# Patient Record
Sex: Male | Born: 1950 | Race: White | Hispanic: No | Marital: Single | Smoking: Former smoker
Health system: Southern US, Community
[De-identification: ages and names within clinical notes are randomized; demographics above are authoritative.]

## PROBLEM LIST (undated history)

## (undated) DIAGNOSIS — R21 Rash and other nonspecific skin eruption: Secondary | ICD-10-CM

## (undated) DIAGNOSIS — K644 Residual hemorrhoidal skin tags: Secondary | ICD-10-CM

## (undated) DIAGNOSIS — M1990 Unspecified osteoarthritis, unspecified site: Secondary | ICD-10-CM

## (undated) DIAGNOSIS — H40003 Preglaucoma, unspecified, bilateral: Secondary | ICD-10-CM

## (undated) DIAGNOSIS — C4490 Unspecified malignant neoplasm of skin, unspecified: Secondary | ICD-10-CM

## (undated) DIAGNOSIS — J45909 Unspecified asthma, uncomplicated: Secondary | ICD-10-CM

## (undated) DIAGNOSIS — K219 Gastro-esophageal reflux disease without esophagitis: Secondary | ICD-10-CM

## (undated) DIAGNOSIS — L42 Pityriasis rosea: Secondary | ICD-10-CM

## (undated) DIAGNOSIS — M722 Plantar fascial fibromatosis: Secondary | ICD-10-CM

## (undated) DIAGNOSIS — Z85828 Personal history of other malignant neoplasm of skin: Secondary | ICD-10-CM

## (undated) DIAGNOSIS — A3790 Whooping cough, unspecified species without pneumonia: Secondary | ICD-10-CM

## (undated) DIAGNOSIS — D696 Thrombocytopenia, unspecified: Principal | ICD-10-CM

## (undated) DIAGNOSIS — I10 Essential (primary) hypertension: Secondary | ICD-10-CM

## (undated) DIAGNOSIS — G039 Meningitis, unspecified: Secondary | ICD-10-CM

## (undated) DIAGNOSIS — B009 Herpesviral infection, unspecified: Secondary | ICD-10-CM

## (undated) DIAGNOSIS — Z973 Presence of spectacles and contact lenses: Secondary | ICD-10-CM

## (undated) DIAGNOSIS — Z872 Personal history of diseases of the skin and subcutaneous tissue: Secondary | ICD-10-CM

## (undated) DIAGNOSIS — J189 Pneumonia, unspecified organism: Secondary | ICD-10-CM

## (undated) DIAGNOSIS — Z9889 Other specified postprocedural states: Secondary | ICD-10-CM

## (undated) DIAGNOSIS — D649 Anemia, unspecified: Secondary | ICD-10-CM

## (undated) DIAGNOSIS — B029 Zoster without complications: Secondary | ICD-10-CM

## (undated) DIAGNOSIS — K642 Third degree hemorrhoids: Secondary | ICD-10-CM

## (undated) DIAGNOSIS — J309 Allergic rhinitis, unspecified: Secondary | ICD-10-CM

## (undated) DIAGNOSIS — C801 Malignant (primary) neoplasm, unspecified: Secondary | ICD-10-CM

## (undated) DIAGNOSIS — Z86010 Personal history of colonic polyps: Secondary | ICD-10-CM

## (undated) DIAGNOSIS — D692 Other nonthrombocytopenic purpura: Secondary | ICD-10-CM

## (undated) DIAGNOSIS — N529 Male erectile dysfunction, unspecified: Secondary | ICD-10-CM

## (undated) HISTORY — DX: Gastro-esophageal reflux disease without esophagitis: K21.9

## (undated) HISTORY — DX: Unspecified asthma, uncomplicated: J45.909

## (undated) HISTORY — DX: Pityriasis rosea: L42

## (undated) HISTORY — DX: Plantar fascial fibromatosis: M72.2

## (undated) HISTORY — PX: HEMORRHOID BANDING: SHX5850

## (undated) HISTORY — DX: Meningitis, unspecified: G03.9

## (undated) HISTORY — PX: TONSILLECTOMY: SHX5217

## (undated) HISTORY — PX: COLONOSCOPY: SHX174

## (undated) HISTORY — DX: Essential (primary) hypertension: I10

## (undated) HISTORY — DX: Zoster without complications: B02.9

## (undated) HISTORY — PX: OTHER SURGICAL HISTORY: SHX169

## (undated) HISTORY — DX: Allergic rhinitis, unspecified: J30.9

## (undated) HISTORY — DX: Unspecified osteoarthritis, unspecified site: M19.90

## (undated) HISTORY — DX: Personal history of colonic polyps: Z86.010

## (undated) HISTORY — DX: Residual hemorrhoidal skin tags: K64.4

## (undated) HISTORY — DX: Male erectile dysfunction, unspecified: N52.9

## (undated) HISTORY — DX: Third degree hemorrhoids: K64.2

## (undated) HISTORY — PX: MOHS SURGERY: SUR867

## (undated) HISTORY — DX: Thrombocytopenia, unspecified: D69.6

## (undated) HISTORY — DX: Herpesviral infection, unspecified: B00.9

## (undated) HISTORY — DX: Rash and other nonspecific skin eruption: R21

## (undated) HISTORY — DX: Whooping cough, unspecified species without pneumonia: A37.90

---
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
## 1984-11-15 HISTORY — PX: DIRECT LARYNGOSCOPY: SHX5326

## 2002-11-15 DIAGNOSIS — L298 Other pruritus: Secondary | ICD-10-CM

## 2002-11-15 DIAGNOSIS — L2989 Other pruritus: Secondary | ICD-10-CM

## 2002-11-15 HISTORY — DX: Other pruritus: L29.89

## 2002-11-15 HISTORY — DX: Other pruritus: L29.8

## 2004-11-15 HISTORY — PX: KNEE ARTHROSCOPY W/ DEBRIDEMENT: SHX1867

## 2005-08-27 ENCOUNTER — Ambulatory Visit: Payer: Self-pay | Admitting: Internal Medicine

## 2005-09-24 ENCOUNTER — Ambulatory Visit: Payer: Self-pay | Admitting: Internal Medicine

## 2005-09-24 ENCOUNTER — Encounter: Admission: RE | Admit: 2005-09-24 | Discharge: 2005-09-24 | Payer: Self-pay | Admitting: Internal Medicine

## 2005-10-26 ENCOUNTER — Ambulatory Visit (HOSPITAL_BASED_OUTPATIENT_CLINIC_OR_DEPARTMENT_OTHER): Admission: RE | Admit: 2005-10-26 | Discharge: 2005-10-26 | Payer: Self-pay | Admitting: Orthopedic Surgery

## 2005-10-26 ENCOUNTER — Ambulatory Visit (HOSPITAL_COMMUNITY): Admission: RE | Admit: 2005-10-26 | Discharge: 2005-10-26 | Payer: Self-pay | Admitting: Orthopedic Surgery

## 2005-10-26 HISTORY — PX: KNEE ARTHROSCOPY W/ MENISCAL REPAIR: SHX1877

## 2005-11-17 ENCOUNTER — Ambulatory Visit: Payer: Self-pay | Admitting: Family Medicine

## 2005-11-19 ENCOUNTER — Ambulatory Visit: Payer: Self-pay | Admitting: Internal Medicine

## 2005-11-26 ENCOUNTER — Ambulatory Visit: Payer: Self-pay | Admitting: Internal Medicine

## 2006-02-21 ENCOUNTER — Ambulatory Visit: Payer: Self-pay | Admitting: Internal Medicine

## 2006-04-22 ENCOUNTER — Ambulatory Visit: Payer: Self-pay | Admitting: Internal Medicine

## 2006-06-14 ENCOUNTER — Ambulatory Visit: Payer: Self-pay | Admitting: Internal Medicine

## 2006-07-19 ENCOUNTER — Ambulatory Visit: Payer: Self-pay | Admitting: Internal Medicine

## 2006-09-16 ENCOUNTER — Ambulatory Visit: Payer: Self-pay | Admitting: Internal Medicine

## 2006-10-13 ENCOUNTER — Ambulatory Visit: Payer: Self-pay | Admitting: Internal Medicine

## 2006-12-12 ENCOUNTER — Ambulatory Visit: Payer: Self-pay | Admitting: Internal Medicine

## 2007-01-09 ENCOUNTER — Ambulatory Visit: Payer: Self-pay | Admitting: Internal Medicine

## 2007-01-20 ENCOUNTER — Ambulatory Visit: Payer: Self-pay | Admitting: Internal Medicine

## 2007-02-16 ENCOUNTER — Ambulatory Visit: Payer: Self-pay | Admitting: Internal Medicine

## 2007-02-16 ENCOUNTER — Ambulatory Visit: Payer: Self-pay | Admitting: Cardiology

## 2007-02-16 LAB — CONVERTED CEMR LAB
ALT: 68 units/L — ABNORMAL HIGH (ref 0–40)
AST: 62 units/L — ABNORMAL HIGH (ref 0–37)
Albumin: 4 g/dL (ref 3.5–5.2)
Alkaline Phosphatase: 125 units/L — ABNORMAL HIGH (ref 39–117)
BUN: 7 mg/dL (ref 6–23)
Bilirubin, Direct: 0.3 mg/dL (ref 0.0–0.3)
CO2: 29 meq/L (ref 19–32)
Calcium: 9.4 mg/dL (ref 8.4–10.5)
Chloride: 102 meq/L (ref 96–112)
Creatinine, Ser: 0.9 mg/dL (ref 0.4–1.5)
GFR calc Af Amer: 113 mL/min
GFR calc non Af Amer: 93 mL/min
Glucose, Bld: 85 mg/dL (ref 70–99)
Potassium: 3.9 meq/L (ref 3.5–5.1)
Sodium: 140 meq/L (ref 135–145)
Total Bilirubin: 1.4 mg/dL — ABNORMAL HIGH (ref 0.3–1.2)
Total Protein: 6.8 g/dL (ref 6.0–8.3)

## 2007-03-02 ENCOUNTER — Ambulatory Visit: Payer: Self-pay | Admitting: Cardiology

## 2007-03-21 ENCOUNTER — Ambulatory Visit: Payer: Self-pay | Admitting: Internal Medicine

## 2007-03-21 LAB — CONVERTED CEMR LAB
ALT: 34 units/L (ref 0–40)
AST: 34 units/L (ref 0–37)
Albumin: 3.9 g/dL (ref 3.5–5.2)
Alkaline Phosphatase: 48 units/L (ref 39–117)
BUN: 13 mg/dL (ref 6–23)
Basophils Absolute: 0 10*3/uL (ref 0.0–0.1)
Basophils Relative: 0.4 % (ref 0.0–1.0)
Bilirubin, Direct: 0.1 mg/dL (ref 0.0–0.3)
CO2: 28 meq/L (ref 19–32)
Calcium: 9.2 mg/dL (ref 8.4–10.5)
Chloride: 109 meq/L (ref 96–112)
Cholesterol: 197 mg/dL (ref 0–200)
Creatinine, Ser: 1.1 mg/dL (ref 0.4–1.5)
Eosinophils Absolute: 0.1 10*3/uL (ref 0.0–0.6)
Eosinophils Relative: 1.5 % (ref 0.0–5.0)
GFR calc Af Amer: 90 mL/min
GFR calc non Af Amer: 74 mL/min
Glucose, Bld: 94 mg/dL (ref 70–99)
HCT: 41.2 % (ref 39.0–52.0)
HDL: 59 mg/dL (ref >39.0)
Hemoglobin: 14.4 g/dL (ref 13.0–17.0)
LDL Cholesterol: 120 mg/dL — ABNORMAL HIGH (ref 0–99)
Lymphocytes Relative: 31.1 % (ref 12.0–46.0)
MCHC: 34.9 g/dL (ref 30.0–36.0)
MCV: 98.7 fL (ref 78.0–100.0)
Monocytes Absolute: 0.5 10*3/uL (ref 0.2–0.7)
Monocytes Relative: 9.4 % (ref 3.0–11.0)
Neutro Abs: 2.8 10*3/uL (ref 1.4–7.7)
Neutrophils Relative %: 57.6 % (ref 43.0–77.0)
PSA: 0.16 ng/mL (ref 0.10–4.00)
Platelets: 157 10*3/uL (ref 150–400)
Potassium: 4.2 meq/L (ref 3.5–5.1)
RBC: 4.18 M/uL — ABNORMAL LOW (ref 4.22–5.81)
RDW: 13.5 % (ref 11.5–14.6)
Sodium: 143 meq/L (ref 135–145)
TSH: 1.98 microintl units/mL (ref 0.35–5.50)
Total Bilirubin: 0.9 mg/dL (ref 0.3–1.2)
Total CHOL/HDL Ratio: 3.3
Total Protein: 6.2 g/dL (ref 6.0–8.3)
Triglycerides: 91 mg/dL (ref 0–149)
VLDL: 18 mg/dL (ref 0–40)
WBC: 5 10*3/uL (ref 4.5–10.5)

## 2007-03-28 ENCOUNTER — Ambulatory Visit: Payer: Self-pay | Admitting: Internal Medicine

## 2007-05-11 DIAGNOSIS — J45909 Unspecified asthma, uncomplicated: Secondary | ICD-10-CM | POA: Insufficient documentation

## 2007-05-11 HISTORY — DX: Unspecified asthma, uncomplicated: J45.909

## 2007-06-28 ENCOUNTER — Ambulatory Visit: Payer: Self-pay | Admitting: Internal Medicine

## 2007-06-28 DIAGNOSIS — L42 Pityriasis rosea: Secondary | ICD-10-CM | POA: Insufficient documentation

## 2007-06-28 DIAGNOSIS — K219 Gastro-esophageal reflux disease without esophagitis: Secondary | ICD-10-CM | POA: Insufficient documentation

## 2007-06-28 DIAGNOSIS — M1710 Unilateral primary osteoarthritis, unspecified knee: Secondary | ICD-10-CM | POA: Insufficient documentation

## 2007-06-28 DIAGNOSIS — M179 Osteoarthritis of knee, unspecified: Secondary | ICD-10-CM | POA: Insufficient documentation

## 2007-06-28 HISTORY — DX: Gastro-esophageal reflux disease without esophagitis: K21.9

## 2007-06-28 HISTORY — DX: Pityriasis rosea: L42

## 2007-06-28 LAB — CONVERTED CEMR LAB

## 2007-07-03 ENCOUNTER — Telehealth: Payer: Self-pay | Admitting: *Deleted

## 2007-10-02 ENCOUNTER — Ambulatory Visit: Payer: Self-pay | Admitting: Internal Medicine

## 2007-12-07 ENCOUNTER — Ambulatory Visit: Payer: Self-pay | Admitting: Internal Medicine

## 2008-01-03 ENCOUNTER — Telehealth: Payer: Self-pay | Admitting: Internal Medicine

## 2008-01-22 ENCOUNTER — Ambulatory Visit: Payer: Self-pay | Admitting: Internal Medicine

## 2008-01-23 ENCOUNTER — Telehealth: Payer: Self-pay | Admitting: Internal Medicine

## 2008-03-25 ENCOUNTER — Ambulatory Visit: Payer: Self-pay | Admitting: Internal Medicine

## 2008-04-02 ENCOUNTER — Encounter: Payer: Self-pay | Admitting: Internal Medicine

## 2008-04-02 ENCOUNTER — Ambulatory Visit: Payer: Self-pay | Admitting: Internal Medicine

## 2008-04-05 ENCOUNTER — Encounter: Payer: Self-pay | Admitting: Internal Medicine

## 2008-05-06 ENCOUNTER — Ambulatory Visit: Payer: Self-pay | Admitting: Internal Medicine

## 2008-05-22 ENCOUNTER — Telehealth: Payer: Self-pay | Admitting: Internal Medicine

## 2008-05-23 ENCOUNTER — Ambulatory Visit: Payer: Self-pay | Admitting: Internal Medicine

## 2008-05-23 LAB — CONVERTED CEMR LAB
ALT: 29 units/L (ref 0–53)
AST: 26 units/L (ref 0–37)
Albumin: 4.1 g/dL (ref 3.5–5.2)
Alkaline Phosphatase: 57 units/L (ref 39–117)
BUN: 13 mg/dL (ref 6–23)
Basophils Absolute: 0 10*3/uL (ref 0.0–0.1)
Basophils Relative: 0.2 % (ref 0.0–1.0)
Bilirubin Urine: NEGATIVE
Bilirubin, Direct: 0.1 mg/dL (ref 0.0–0.3)
CO2: 29 meq/L (ref 19–32)
Calcium: 9.2 mg/dL (ref 8.4–10.5)
Chloride: 106 meq/L (ref 96–112)
Cholesterol: 179 mg/dL (ref 0–200)
Creatinine, Ser: 0.9 mg/dL (ref 0.4–1.5)
Eosinophils Absolute: 0.1 10*3/uL (ref 0.0–0.7)
Eosinophils Relative: 1 % (ref 0.0–5.0)
GFR calc Af Amer: 113 mL/min
GFR calc non Af Amer: 93 mL/min
Glucose, Bld: 85 mg/dL (ref 70–99)
HCT: 41.8 % (ref 39.0–52.0)
HDL: 58.3 mg/dL (ref >39.0)
Hemoglobin: 14.8 g/dL (ref 13.0–17.0)
Ketones, urine, test strip: NEGATIVE
LDL Cholesterol: 108 mg/dL — ABNORMAL HIGH (ref 0–99)
Lymphocytes Relative: 30.8 % (ref 12.0–46.0)
MCHC: 35.5 g/dL (ref 30.0–36.0)
MCV: 96.3 fL (ref 78.0–100.0)
Monocytes Absolute: 0.5 10*3/uL (ref 0.1–1.0)
Monocytes Relative: 7.9 % (ref 3.0–12.0)
Neutro Abs: 3.3 10*3/uL (ref 1.4–7.7)
Neutrophils Relative %: 60.1 % (ref 43.0–77.0)
Nitrite: NEGATIVE
PSA: 0.17 ng/mL (ref 0.10–4.00)
Platelets: 146 10*3/uL — ABNORMAL LOW (ref 150–400)
Potassium: 4 meq/L (ref 3.5–5.1)
Protein, U semiquant: NEGATIVE
RBC: 4.34 M/uL (ref 4.22–5.81)
RDW: 11.3 % — ABNORMAL LOW (ref 11.5–14.6)
Sodium: 143 meq/L (ref 135–145)
Specific Gravity, Urine: 1.01
TSH: 1.3 microintl units/mL (ref 0.35–5.50)
Total Bilirubin: 1.1 mg/dL (ref 0.3–1.2)
Total CHOL/HDL Ratio: 3.1
Total Protein: 6.4 g/dL (ref 6.0–8.3)
Triglycerides: 62 mg/dL (ref 0–149)
Urobilinogen, UA: 0.2
VLDL: 12 mg/dL (ref 0–40)
WBC Urine, dipstick: NEGATIVE
WBC: 5.7 10*3/uL (ref 4.5–10.5)
pH: 7.5

## 2008-05-29 ENCOUNTER — Ambulatory Visit: Payer: Self-pay | Admitting: Internal Medicine

## 2008-05-29 DIAGNOSIS — R791 Abnormal coagulation profile: Secondary | ICD-10-CM | POA: Insufficient documentation

## 2008-05-29 LAB — CONVERTED CEMR LAB
INR: 1.1 — ABNORMAL HIGH (ref 0.8–1.0)
Prothrombin Time: 12.8 s (ref 10.9–13.3)
aPTT: 29.2 s (ref 21.7–29.8)

## 2008-06-04 LAB — CONVERTED CEMR LAB: Protein S Ag, Total: 82 % (ref 70–140)

## 2008-06-14 ENCOUNTER — Ambulatory Visit: Payer: Self-pay | Admitting: Oncology

## 2008-06-25 ENCOUNTER — Encounter: Payer: Self-pay | Admitting: Internal Medicine

## 2008-07-02 ENCOUNTER — Ambulatory Visit: Payer: Self-pay | Admitting: Internal Medicine

## 2008-07-02 DIAGNOSIS — R21 Rash and other nonspecific skin eruption: Secondary | ICD-10-CM | POA: Insufficient documentation

## 2008-07-10 ENCOUNTER — Encounter: Payer: Self-pay | Admitting: Internal Medicine

## 2008-07-15 ENCOUNTER — Ambulatory Visit: Payer: Self-pay | Admitting: Internal Medicine

## 2008-08-16 ENCOUNTER — Ambulatory Visit: Payer: Self-pay | Admitting: Internal Medicine

## 2008-08-16 DIAGNOSIS — J309 Allergic rhinitis, unspecified: Secondary | ICD-10-CM | POA: Insufficient documentation

## 2008-08-16 DIAGNOSIS — J310 Chronic rhinitis: Secondary | ICD-10-CM | POA: Insufficient documentation

## 2008-08-29 ENCOUNTER — Encounter: Payer: Self-pay | Admitting: Internal Medicine

## 2008-09-18 ENCOUNTER — Ambulatory Visit: Payer: Self-pay | Admitting: Internal Medicine

## 2008-09-18 DIAGNOSIS — K644 Residual hemorrhoidal skin tags: Secondary | ICD-10-CM | POA: Insufficient documentation

## 2008-09-18 DIAGNOSIS — K602 Anal fissure, unspecified: Secondary | ICD-10-CM | POA: Insufficient documentation

## 2008-09-18 HISTORY — DX: Residual hemorrhoidal skin tags: K64.4

## 2008-09-19 ENCOUNTER — Encounter: Payer: Self-pay | Admitting: Internal Medicine

## 2008-09-20 ENCOUNTER — Telehealth: Payer: Self-pay | Admitting: Internal Medicine

## 2008-09-25 ENCOUNTER — Telehealth (INDEPENDENT_AMBULATORY_CARE_PROVIDER_SITE_OTHER): Payer: Self-pay | Admitting: *Deleted

## 2008-09-27 ENCOUNTER — Ambulatory Visit: Payer: Self-pay | Admitting: Internal Medicine

## 2008-09-27 ENCOUNTER — Telehealth: Payer: Self-pay | Admitting: Internal Medicine

## 2008-09-27 DIAGNOSIS — L02419 Cutaneous abscess of limb, unspecified: Secondary | ICD-10-CM | POA: Insufficient documentation

## 2008-09-27 DIAGNOSIS — L03119 Cellulitis of unspecified part of limb: Secondary | ICD-10-CM

## 2008-10-04 ENCOUNTER — Telehealth (INDEPENDENT_AMBULATORY_CARE_PROVIDER_SITE_OTHER): Payer: Self-pay | Admitting: *Deleted

## 2008-10-15 ENCOUNTER — Ambulatory Visit: Payer: Self-pay | Admitting: Internal Medicine

## 2008-10-15 DIAGNOSIS — D485 Neoplasm of uncertain behavior of skin: Secondary | ICD-10-CM | POA: Insufficient documentation

## 2008-10-18 ENCOUNTER — Encounter: Payer: Self-pay | Admitting: Internal Medicine

## 2008-10-24 ENCOUNTER — Ambulatory Visit: Payer: Self-pay | Admitting: Internal Medicine

## 2008-11-21 ENCOUNTER — Telehealth: Payer: Self-pay | Admitting: Internal Medicine

## 2008-11-22 ENCOUNTER — Ambulatory Visit: Payer: Self-pay | Admitting: Internal Medicine

## 2008-11-22 DIAGNOSIS — A5400 Gonococcal infection of lower genitourinary tract, unspecified: Secondary | ICD-10-CM | POA: Insufficient documentation

## 2009-01-15 ENCOUNTER — Ambulatory Visit: Payer: Self-pay | Admitting: Internal Medicine

## 2009-04-03 ENCOUNTER — Telehealth: Payer: Self-pay | Admitting: Internal Medicine

## 2009-04-30 ENCOUNTER — Ambulatory Visit: Payer: Self-pay | Admitting: Internal Medicine

## 2009-05-23 ENCOUNTER — Ambulatory Visit: Payer: Self-pay | Admitting: Internal Medicine

## 2009-05-23 LAB — CONVERTED CEMR LAB
ALT: 35 units/L (ref 0–53)
AST: 39 units/L — ABNORMAL HIGH (ref 0–37)
Albumin: 4.2 g/dL (ref 3.5–5.2)
Alkaline Phosphatase: 56 units/L (ref 39–117)
BUN: 14 mg/dL (ref 6–23)
Basophils Absolute: 0 10*3/uL (ref 0.0–0.1)
Basophils Relative: 0.2 % (ref 0.0–3.0)
Bilirubin Urine: NEGATIVE
Bilirubin, Direct: 0.1 mg/dL (ref 0.0–0.3)
Blood in Urine, dipstick: NEGATIVE
CO2: 32 meq/L (ref 19–32)
Calcium: 9.2 mg/dL (ref 8.4–10.5)
Chloride: 104 meq/L (ref 96–112)
Cholesterol: 180 mg/dL (ref 0–200)
Creatinine, Ser: 0.9 mg/dL (ref 0.4–1.5)
Eosinophils Absolute: 0.1 10*3/uL (ref 0.0–0.7)
Eosinophils Relative: 0.9 % (ref 0.0–5.0)
GFR calc non Af Amer: 92.68 mL/min (ref >60)
Glucose, Bld: 98 mg/dL (ref 70–99)
Glucose, Urine, Semiquant: NEGATIVE
HCT: 42.4 % (ref 39.0–52.0)
HDL: 55.7 mg/dL (ref >39.00)
Hemoglobin: 15 g/dL (ref 13.0–17.0)
Ketones, urine, test strip: NEGATIVE
LDL Cholesterol: 111 mg/dL — ABNORMAL HIGH (ref 0–99)
Lymphocytes Relative: 27.8 % (ref 12.0–46.0)
Lymphs Abs: 1.6 10*3/uL (ref 0.7–4.0)
MCHC: 35.3 g/dL (ref 30.0–36.0)
MCV: 98 fL (ref 78.0–100.0)
Monocytes Absolute: 0.5 10*3/uL (ref 0.1–1.0)
Monocytes Relative: 7.8 % (ref 3.0–12.0)
Neutro Abs: 3.6 10*3/uL (ref 1.4–7.7)
Neutrophils Relative %: 63.3 % (ref 43.0–77.0)
Nitrite: NEGATIVE
PSA: 0.19 ng/mL (ref 0.10–4.00)
Platelets: 152 10*3/uL (ref 150.0–400.0)
Potassium: 4.6 meq/L (ref 3.5–5.1)
Protein, U semiquant: NEGATIVE
RBC: 4.32 M/uL (ref 4.22–5.81)
RDW: 12.2 % (ref 11.5–14.6)
Sodium: 143 meq/L (ref 135–145)
Specific Gravity, Urine: 1.015
TSH: 1.39 microintl units/mL (ref 0.35–5.50)
Total Bilirubin: 1.1 mg/dL (ref 0.3–1.2)
Total CHOL/HDL Ratio: 3
Total Protein: 6.5 g/dL (ref 6.0–8.3)
Triglycerides: 65 mg/dL (ref 0.0–149.0)
Urobilinogen, UA: 0.2
VLDL: 13 mg/dL (ref 0.0–40.0)
WBC Urine, dipstick: NEGATIVE
WBC: 5.8 10*3/uL (ref 4.5–10.5)
pH: 8

## 2009-06-02 ENCOUNTER — Ambulatory Visit: Payer: Self-pay | Admitting: Internal Medicine

## 2009-06-06 LAB — CONVERTED CEMR LAB

## 2009-07-28 ENCOUNTER — Ambulatory Visit: Payer: Self-pay | Admitting: Internal Medicine

## 2009-09-22 ENCOUNTER — Ambulatory Visit: Payer: Self-pay | Admitting: Internal Medicine

## 2009-09-22 ENCOUNTER — Telehealth: Payer: Self-pay | Admitting: Internal Medicine

## 2009-09-22 DIAGNOSIS — N410 Acute prostatitis: Secondary | ICD-10-CM | POA: Insufficient documentation

## 2009-10-06 ENCOUNTER — Ambulatory Visit: Payer: Self-pay | Admitting: Internal Medicine

## 2009-11-24 ENCOUNTER — Telehealth: Payer: Self-pay | Admitting: Internal Medicine

## 2009-11-24 ENCOUNTER — Ambulatory Visit: Payer: Self-pay | Admitting: Internal Medicine

## 2009-12-22 ENCOUNTER — Ambulatory Visit: Payer: Self-pay | Admitting: Internal Medicine

## 2009-12-22 DIAGNOSIS — J441 Chronic obstructive pulmonary disease with (acute) exacerbation: Secondary | ICD-10-CM | POA: Insufficient documentation

## 2009-12-24 ENCOUNTER — Telehealth: Payer: Self-pay | Admitting: Internal Medicine

## 2010-01-19 ENCOUNTER — Ambulatory Visit: Payer: Self-pay | Admitting: Internal Medicine

## 2010-01-21 ENCOUNTER — Ambulatory Visit: Payer: Self-pay | Admitting: Internal Medicine

## 2010-01-22 ENCOUNTER — Encounter: Payer: Self-pay | Admitting: Internal Medicine

## 2010-02-17 ENCOUNTER — Ambulatory Visit: Payer: Self-pay | Admitting: Internal Medicine

## 2010-03-13 ENCOUNTER — Telehealth: Payer: Self-pay | Admitting: Internal Medicine

## 2010-03-13 ENCOUNTER — Encounter: Payer: Self-pay | Admitting: Internal Medicine

## 2010-03-16 ENCOUNTER — Telehealth: Payer: Self-pay | Admitting: Internal Medicine

## 2010-03-17 ENCOUNTER — Ambulatory Visit: Payer: Self-pay | Admitting: Internal Medicine

## 2010-03-19 ENCOUNTER — Telehealth: Payer: Self-pay | Admitting: Internal Medicine

## 2010-05-14 ENCOUNTER — Ambulatory Visit: Payer: Self-pay | Admitting: Family Medicine

## 2010-05-14 DIAGNOSIS — M722 Plantar fascial fibromatosis: Secondary | ICD-10-CM

## 2010-05-14 HISTORY — DX: Plantar fascial fibromatosis: M72.2

## 2010-06-19 ENCOUNTER — Ambulatory Visit: Payer: Self-pay | Admitting: Internal Medicine

## 2010-06-19 LAB — CONVERTED CEMR LAB
ALT: 26 units/L (ref 0–53)
AST: 26 units/L (ref 0–37)
Albumin: 4.4 g/dL (ref 3.5–5.2)
Alkaline Phosphatase: 57 units/L (ref 39–117)
BUN: 13 mg/dL (ref 6–23)
Basophils Absolute: 0 10*3/uL (ref 0.0–0.1)
Basophils Relative: 0.6 % (ref 0.0–3.0)
Bilirubin Urine: NEGATIVE
Bilirubin, Direct: 0.2 mg/dL (ref 0.0–0.3)
Blood in Urine, dipstick: NEGATIVE
CO2: 31 meq/L (ref 19–32)
Calcium: 9.4 mg/dL (ref 8.4–10.5)
Chloride: 105 meq/L (ref 96–112)
Cholesterol: 204 mg/dL — ABNORMAL HIGH (ref 0–200)
Creatinine, Ser: 0.9 mg/dL (ref 0.4–1.5)
Direct LDL: 117.8 mg/dL
Eosinophils Absolute: 0.1 10*3/uL (ref 0.0–0.7)
Eosinophils Relative: 1.1 % (ref 0.0–5.0)
GFR calc non Af Amer: 92.33 mL/min (ref >60)
Glucose, Bld: 94 mg/dL (ref 70–99)
Glucose, Urine, Semiquant: NEGATIVE
HCT: 43.9 % (ref 39.0–52.0)
HDL: 63 mg/dL (ref >39.00)
Hemoglobin: 15.2 g/dL (ref 13.0–17.0)
Ketones, urine, test strip: NEGATIVE
Lymphocytes Relative: 32.5 % (ref 12.0–46.0)
Lymphs Abs: 1.7 10*3/uL (ref 0.7–4.0)
MCHC: 34.7 g/dL (ref 30.0–36.0)
MCV: 100.8 fL — ABNORMAL HIGH (ref 78.0–100.0)
Monocytes Absolute: 0.4 10*3/uL (ref 0.1–1.0)
Monocytes Relative: 7.1 % (ref 3.0–12.0)
Neutro Abs: 3.1 10*3/uL (ref 1.4–7.7)
Neutrophils Relative %: 58.7 % (ref 43.0–77.0)
Nitrite: NEGATIVE
PSA: 0.2 ng/mL (ref 0.10–4.00)
Platelets: 151 10*3/uL (ref 150.0–400.0)
Potassium: 4.4 meq/L (ref 3.5–5.1)
Protein, U semiquant: NEGATIVE
RBC: 4.35 M/uL (ref 4.22–5.81)
RDW: 13 % (ref 11.5–14.6)
Sodium: 144 meq/L (ref 135–145)
Specific Gravity, Urine: 1.015
TSH: 1.39 microintl units/mL (ref 0.35–5.50)
Total Bilirubin: 0.6 mg/dL (ref 0.3–1.2)
Total CHOL/HDL Ratio: 3
Total Protein: 6.5 g/dL (ref 6.0–8.3)
Triglycerides: 63 mg/dL (ref 0.0–149.0)
Urobilinogen, UA: 0.2
VLDL: 12.6 mg/dL (ref 0.0–40.0)
WBC Urine, dipstick: NEGATIVE
WBC: 5.3 10*3/uL (ref 4.5–10.5)
pH: 7

## 2010-06-26 ENCOUNTER — Ambulatory Visit: Payer: Self-pay | Admitting: Internal Medicine

## 2010-07-10 LAB — CONVERTED CEMR LAB

## 2010-09-29 ENCOUNTER — Encounter: Payer: Self-pay | Admitting: Internal Medicine

## 2010-10-23 ENCOUNTER — Ambulatory Visit: Payer: Self-pay | Admitting: Internal Medicine

## 2010-12-02 ENCOUNTER — Telehealth: Payer: Self-pay | Admitting: Internal Medicine

## 2010-12-03 ENCOUNTER — Encounter: Payer: Self-pay | Admitting: *Deleted

## 2010-12-03 ENCOUNTER — Encounter: Payer: Self-pay | Admitting: Internal Medicine

## 2010-12-03 ENCOUNTER — Ambulatory Visit
Admission: RE | Admit: 2010-12-03 | Discharge: 2010-12-03 | Payer: Self-pay | Source: Home / Self Care | Attending: Internal Medicine | Admitting: Internal Medicine

## 2010-12-03 DIAGNOSIS — J45909 Unspecified asthma, uncomplicated: Secondary | ICD-10-CM

## 2010-12-03 DIAGNOSIS — Z862 Personal history of diseases of the blood and blood-forming organs and certain disorders involving the immune mechanism: Secondary | ICD-10-CM

## 2010-12-03 DIAGNOSIS — D696 Thrombocytopenia, unspecified: Secondary | ICD-10-CM

## 2010-12-03 HISTORY — DX: Thrombocytopenia, unspecified: D69.6

## 2010-12-03 HISTORY — DX: Personal history of diseases of the blood and blood-forming organs and certain disorders involving the immune mechanism: Z86.2

## 2010-12-03 NOTE — Patient Instructions (Signed)
Metered Dose Inhaler (No spacer used)   Inhaled medicines are the basis of asthma treatment and other breathing problems. Inhaled medicine can only be effective if used properly. Good technique assures that the medicine reaches the lungs.   Metered dose inhalers (MDIs) are used to deliver a variety of inhaled medicines. These include quick relief medicines, controller medicines (such as corticosteroids), and cromolyn. The medicine is delivered by pushing down on a metal canister to release a set amount of spray.    If you are using different kinds of inhalers, use your quick relief medicine to open the airways 10 - 15 minutes before using a steroid. If you are unsure which inhalers to use and the order of using them, ask your caregiver, nurse, or respiratory therapist.   HOW TO USE THE INHALER 1 .Remove cap from inhaler. 2 .Shake inhaler for 5 seconds before each inhalation (breathing in). 3 .Position the inhaler so that the top of the canister faces up. 4 .Put your index finger on the top of the medication canister. Your thumb supports the bottom of the inhaler. 5 .Open your mouth. 6 .Hold the inhaler 1 to 2 inches away from your open mouth. This allows the medicine to slow down before the medicine enters the mouth. 7 .Exhale (breathe out) normally and as completely as possible. 8 .Press the canister down with the index finger to release the medication. 9 .At the same time as the canister is pressed, inhale deeply and slowly until the lungs are completely filled. This should take 4 to 6 seconds. Keep your tongue down. 1 0.Hold the medication in your lungs for 4 to 10 seconds before exhaling (breathing out). This helps the medicine get into the small airways of your lungs to work better. 1 1.Breathe out slowly, through pursed lips. Whistling is an example of pursed lips. 1 2.Wait at least 1 minute between puffs. Continue with the above steps until you have taken the number of puffs your  caregiver has ordered. 1 3.Replace cap on inhaler.   AVOID the following:  Inhaling before or after starting the spray of medicine. It takes practice to coordinate your breathing with triggering the spray.  Inhaling through the nose (rather than the mouth) when triggering the spray.   HOW TO DETERMINE IF YOUR INHALER IS FULL OR NEARLY EMPTY:  Determine when an inhaler is empty. It is not easy to know when an MDI canister is empty by shaking it. A few MDIs are now being made with dose counters. Ask your caregiver for a prescription that has a dose counter if you feel you need that extra help.  If your inhaler does not have a counter, check the number of doses in the inhaler before you use it. The canister or box will list the number of doses in the canister. Divide the total number of doses in the canister by the number you will use each day to find how many days the canister will last. (For example, if your canister has 200 doses and you take 2 puffs, 4 times each day, which is 8 puffs a day. Dividing 200 by 8 equals 25.  The canister should last 25 days.) Using a calendar, count forward that many days to see when your inhaler will run out. Write the refill date on a calendar or your canister.  Remember, if you need to take extra doses, the inhaler will empty sooner than you figured. Be sure you have a refill before your canister runs  out. Refill your inhaler 7 to 10 days before it runs out.   HOME CARE INSTRUCTIONS  DO NOT use the inhaler more than your caregiver tells you. If you are still wheezing and are feeling tightness in your chest, call your caregiver.   Keep an adequate supply of medication. This includes making sure the medicine is not expired, and you have a spare MDI.  Follow your caregiver or inhaler insert directions for cleaning the inhaler.   SEEK MEDICAL CARE IF:  Symptoms are only partially relieved with your inhalers.  You are having trouble using your inhalers.   You experience some increase in phlegm.  You develop a fever of 100.5 F (38.1 C).   SEEK IMMEDIATE MEDICAL CARE IF:  You feel little or no relief with your inhalers. You are still wheezing and are feeling shortness of breath and/or tightness in your chest.  You have side effects such as dizziness, headaches, or fast heart rate.  You have chills, fever, night sweats or an oral temperature above 102 F (38.9 C) develops.  Phlegm production increases a lot, or there is blood in the phlegm.   MAKE SURE YOU:   Understand these instructions.   Will watch your condition.  Will get help right away if you are not doing well or get worse.   Document Released: 08/29/2007  Document Re-Released: 08/28/2009 [facility] Patient Information 2011 [place], [place].

## 2010-12-03 NOTE — Assessment & Plan Note (Signed)
Pulmonary functions will be obtained today as a baseline and the dulera will be reduced to 100/20 due to the frequency of the fungal infections. Her has improved so much in the frequency of his asthma flairs that I am hesitant to removed the corticosteroid completely. We ill follow up in 2 months with repeat PFT and monitering A refillable rx fordiflucan will be given as well.

## 2010-12-03 NOTE — Progress Notes (Signed)
Subjective:     Patient ID: Tyler [name] is a 60 y.o. male.  Asthma This is a recurrent problem. The current episode started more than 1 year ago. The problem occurs intermittently. The problem has been gradually improving. Associated symptoms include postnasal drip, rhinorrhea, a sore throat and trouble swallowing. Pertinent negatives include no appetite change, chest pain, dyspnea on exertion or ear congestion. Associated symptoms comments: Recurrent candidal infections in the deep throat from the use of steroid inhailers. Relieved by: diflucan fro the candidal infection. He reports significant improvement on treatment. Risk factors for lung disease include smoking/tobacco exposure. His past medical history is significant for asthma.    The following portions of the patient's history were reviewed and updated as appropriate: allergies, current medications, past family history, past medical history, past social history, past surgical history and problem list.  Review of Systems  Constitutional: Negative for appetite change.  HENT: Positive for sore throat, rhinorrhea, trouble swallowing and postnasal drip.   Eyes: Negative.   Respiratory: Negative.   Cardiovascular: Negative.  Negative for chest pain and dyspnea on exertion.  Neurological: Negative.   Psychiatric/Behavioral: Negative.      Objective:   Physical Exam  Constitutional: He is oriented to person, place, and time.  HENT:  Head: Normocephalic.  Right Ear: External ear normal.  Left Ear: External ear normal.  Mouth/Throat: Oropharynx is clear and moist.       Surgical changes from tonsilectomy  Eyes: Conjunctivae are normal. Pupils are equal, round, and reactive to light.  Neck: Normal range of motion. Neck supple.  Pulmonary/Chest: Breath sounds normal.  Abdominal: Bowel sounds are normal.  Musculoskeletal: Normal range of motion.  Neurological: He is alert and oriented to person, place, and time.  Skin: Skin is warm  and dry.     Assessment:  See the problem list addendum with PFTs and change in medications

## 2010-12-05 ENCOUNTER — Encounter: Payer: Self-pay | Admitting: Internal Medicine

## 2010-12-15 NOTE — Progress Notes (Signed)
Summary: temp 101-102  Phone Note Call from Patient   Caller: Patient Call For: Stacie Glaze MD Summary of Call: Pt is no better and has developed a fever of 101-102.  Asking for advice.  Same symptoms. [phone] Initial call taken by: Lynann Beaver CMA,  Mar 19, 2010 9:55 AM  Follow-up for Phone Call        per dr Ival Bible levaquin 750 1 once daily for 10 days Follow-up by: Willy Eddy, LPN,  Mar 20, 5175 10:15 AM    New/Updated Medications: LEVAQUIN 750 MG TABS (LEVOFLOXACIN) one by mouth q day Prescriptions: LEVAQUIN 750 MG TABS (LEVOFLOXACIN) one by mouth q day  #10 x 0   Entered by:   Lynann Beaver CMA   Authorized by:   Stacie Glaze MD   Signed by:   Lynann Beaver CMA on 03/19/2010   Method used:   Electronically to        [facility] [address] [id number]* (retail)       [address]       [place], [place]  [address]       Ph: [phone]       Fax: [phone]   RxID:   [id number]  Pt notified.

## 2010-12-15 NOTE — Assessment & Plan Note (Signed)
Summary: sinusitis/bmw   Vital Signs:  Patient profile:   60 year old male Height:      71 inches Weight:      163 pounds BMI:     22.82 Temp:     99.1 degrees F oral Pulse rate:   76 / minute Resp:     14 per minute BP sitting:   130 / 80  (left arm)  Vitals Entered By: Willy Eddy, LPN (Mar 17, 1609 8:51 AM) CC: continues to c/o cough and congestion and earache-has completed zpack,1 day remaining of prednisone dose pack, and some huydromet remaining-  has performance this weekend   CC:  continues to c/o cough and congestion and earache-has completed zpack, 1 day remaining of prednisone dose pack, and and some huydromet remaining-  has performance this weekend.  History of Present Illness: severe bronchitis with increased cough fits of cough that result in wheezing recent z pack and cough meds with no effect using nebulized three times a day no wheezing except fter cough fits hx of asthma  Preventive Screening-Counseling & Management  Alcohol-Tobacco     Smoking Status: quit  Problems Prior to Update: 1)  Bronchitis, Obstructive Chronic, Acute Exacerbation  (ICD-491.21) 2)  Pharyngitis, Streptococcal  (ICD-034.0) 3)  Acute Prostatitis  (ICD-601.0) 4)  Rash-nonvesicular  (ICD-782.1) 5)  Rash-nonvesicular  (ICD-782.1) 6)  Gonococcal Infection Lower Genitourinary Tract  (ICD-098.0) 7)  Neoplasm, Skin, Uncertain Behavior  (ICD-238.2) 8)  Cellulitis and Abscess of Leg Except Foot  (ICD-682.6) 9)  Rectal Fissure  (ICD-565.0) 10)  External Hemorrhoids With Other Complication  (ICD-455.5) 11)  Allergic Rhinitis  (ICD-477.9) 12)  Rash and Other Nonspecific Skin Eruption  (ICD-782.1) 13)  Abnormal Coagulation Profile  (ICD-790.92) 14)  Contusion of Unspecified Site  (ICD-924.9) 15)  Physical Examination  (ICD-V70.0) 16)  Screening For Unspecified Malignant Neoplasm  (ICD-V76.9) 17)  Osteoarthrosis, Local Nos, 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 Take 1 Tablet By Mouth Once A Day 2)  Veramyst 27.5 Mcg/spray  Susp (Fluticasone Furoate) .Marland Kitchen.. 1 Spray Each Nostril Two Times A Day 3)  Cialis 5 Mg Tabs (Tadalafil) .... One By Mouth Daily 4)  Nexium 40 Mg Cpdr (Esomeprazole Magnesium) .... One By Mouth Two Times A Day 5)  Singulair 10 Mg Tabs (Montelukast Sodium) .Marland Kitchen.. 1 Once Daily 6)  Dulera 200-5 Mcg/act Aero (Mometasone Furo-Formoterol Fum) .... Two Puff Two Times A Day 7)  Proair Hfa 108 (90 Base) Mcg/act Aers (Albuterol Sulfate) .... 2 Inhalations Two Times A Day Prn 8)  Albuterol Sulfate (2.5 Mg/24ml) 0.083% Nebu (Albuterol Sulfate) .... To Use A 8 Hours As Needed With Nebulizer Prn 9)  Medrol (Pak) 4 Mg Tabs (Methylprednisolone) .... Take As Directed 10)  Hydromet 5-1.5 Mg/24ml Syrp (Hydrocodone-Homatropine) .Marland Kitchen.. 1 Tsp Every 6 Hours Prh Cough 11)  Zithromax Z-Pak 250 Mg Tabs (Azithromycin) .... Take As Directed  Current Medications (verified): 1)  Valtrex 1 Gm  Tabs (Valacyclovir Hcl) .... Take 1 Tablet By Mouth Once A Day 2)  Veramyst 27.5 Mcg/spray  Susp (Fluticasone Furoate) .Marland Kitchen.. 1 Spray Each Nostril Two Times A Day 3)  Cialis 5 Mg Tabs (Tadalafil) .... One By Mouth Daily 4)  Nexium 40 Mg Cpdr (Esomeprazole Magnesium) .... One By Mouth Two Times A  Day 5)  Singulair 10 Mg Tabs (Montelukast Sodium) .Marland Kitchen.. 1 Once Daily 6)  Dulera 200-5 Mcg/act Aero (Mometasone Furo-Formoterol Fum) .... Two Puff Two Times A Day 7)  Proair Hfa 108 (90 Base) Mcg/act Aers (Albuterol Sulfate) .... 2 Inhalations Two Times A Day Prn 8)  Albuterol Sulfate (2.5 Mg/58ml) 0.083% Nebu (Albuterol Sulfate) .... To Use A 8 Hours As Needed With Nebulizer Prn 9)  Tussionex Pennkinetic Er 8-10 Mg/54ml Lqcr (Chlorpheniramine-Hydrocodone) .... One Tsp By Mouth Two Times A Day 10)   Prednisone 20 Mg Tabs (Prednisone) .... 3 By Mouth For 3 Days Then 2 By Mouth For 2 Days and Then 1 By Mouth For 2 Day and Then 1/2 By Mouth For 2 Days 11)  Mucinex D 60-600 Mg Xr12h-Tab (Pseudoephedrine-Guaifenesin) .... One Totwo By Mouth Two Times A Day 12)  Fluconazole 100 Mg Tabs (Fluconazole) .... One By Mouth Daily For 7 Days Post The Prednisone  Allergies (verified): 1)  ! Pcn 2)  ! 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  Past Medical History: Reviewed history from 08/16/2008 and no changes required. Asthma Herpes GERD Allergic rhinitis  Past Surgical History: Reviewed history from 07/12/07 and no changes required. arthroscopic surgery right knee bx  for granuloma  Family History: Reviewed history from 07/12/07 and no changes required. father died at 64 MVA mother  Family History of Arthritis Family History Hypertension  Social History: Reviewed history from 07/12/07 and no changes required. 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  The patient denies anorexia, fever, weight loss, weight gain, vision loss, decreased hearing, hoarseness, chest pain, syncope, peripheral edema, hemoptysis, abdominal pain, melena, hematochezia, severe indigestion/heartburn, hematuria, incontinence, genital sores,  muscle weakness, suspicious skin lesions, transient blindness, difficulty walking, depression, unusual weight change, abnormal bleeding, enlarged lymph nodes, angioedema, breast masses, and testicular masses.    Physical Exam  General:  alert and pale.   Head:  normocephalic and no abnormalities observed.   Eyes:  pupils equal and pupils round.   Ears:  R ear normal and L ear normal.   Nose:  no external deformity and no nasal discharge.   Neck:  nuchal rigidity.  muscle spasm Lungs:  normal respiratory effort and no wheezes.   Heart:  normal rate and regular rhythm.   Abdomen:  soft, non-tender, and normal bowel sounds.   Msk:  normal ROM, no joint tenderness, and no joint swelling.   Extremities:  trace left pedal edema and trace right pedal edema.   Neurologic:  alert & oriented X3 and cranial nerves II-XII intact.     Impression & Recommendations:  Problem # 1:  BRONCHITIS, OBSTRUCTIVE CHRONIC, ACUTE EXACERBATION (ICD-491.21) Assessment Deteriorated acute visit for flair of asthma and worsening cough with swheezing and increased nebulizer use prednisone dose pack tussionex to control cough and mucinex d two times a day moniter  Problem # 2:  ASTHMA (ICD-493.90) Assessment: Deteriorated  The following medications were removed  from the medication list:    Medrol (pak) 4 Mg Tabs (Methylprednisolone) .Marland Kitchen... Take as directed His updated medication list for this problem includes:    Singulair 10 Mg Tabs (Montelukast sodium) .Marland Kitchen... 1 once daily    Dulera 200-5 Mcg/act Aero (Mometasone furo-formoterol fum) .Marland Kitchen..Marland Kitchen Two puff two times a day    Proair Hfa 108 (90 Base) Mcg/act Aers (Albuterol sulfate) .Marland Kitchen... 2 inhalations two times a day prn    Albuterol Sulfate (2.5 Mg/77ml) 0.083% Nebu (Albuterol sulfate) .Marland Kitchen... To use a 8 hours as needed with nebulizer prn    Prednisone 20 Mg Tabs (Prednisone) .Marland KitchenMarland KitchenMarland KitchenMarland Kitchen 3 by mouth for 3 days then 2 by mouth for 2 days and then 1 by mouth for 2 day and then 1/2  by mouth for 2 days  Complete Medication List: 1)  Valtrex 1 Gm Tabs (Valacyclovir hcl) .... Take 1 tablet by mouth once a day 2)  Veramyst 27.5 Mcg/spray Susp (Fluticasone furoate) .Marland Kitchen.. 1 spray each nostril two times a day 3)  Cialis 5 Mg Tabs (Tadalafil) .... One by mouth daily 4)  Nexium 40 Mg Cpdr (Esomeprazole magnesium) .... One by mouth two times a day 5)  Singulair 10 Mg Tabs (Montelukast sodium) .Marland Kitchen.. 1 once daily 6)  Dulera 200-5 Mcg/act Aero (Mometasone furo-formoterol fum) .... Two puff two times a day 7)  Proair Hfa 108 (90 Base) Mcg/act Aers (Albuterol sulfate) .... 2 inhalations two times a day prn 8)  Albuterol Sulfate (2.5 Mg/87ml) 0.083% Nebu (Albuterol sulfate) .... To use a 8 hours as needed with nebulizer prn 9)  Tussionex Pennkinetic Er 8-10 Mg/32ml Lqcr (Chlorpheniramine-hydrocodone) .... One tsp by mouth two times a day 10)  Prednisone 20 Mg Tabs (Prednisone) .... 3 by mouth for 3 days then 2 by mouth for 2 days and then 1 by mouth for 2 day and then 1/2 by mouth for 2 days 11)  Mucinex D 60-600 Mg Xr12h-tab (Pseudoephedrine-guaifenesin) .... One totwo by mouth two times a day 12)  Fluconazole 100 Mg Tabs (Fluconazole) .... One by mouth daily for 7 days post the prednisone  Patient Instructions: 1)  call if no improvment in  5-7 days, sooner if increasing cough, fever, or new symptoms( shortness of breath, chest pain). Prescriptions: FLUCONAZOLE 100 MG TABS (FLUCONAZOLE) one by mouth daily for 7 days post the prednisone  #7 x 0   Entered and Authorized by:   Stacie Glaze MD   Signed by:   Stacie Glaze MD on 03/17/2010   Method used:   Electronically to        [facility] [address] [phone]* (retail)       [address]       [place], [place]  [address]       Ph: [phone]       Fax: [phone]   RxID:   [id number] Sandria Senter ER 8-10 MG/5ML LQCR (CHLORPHENIRAMINE-HYDROCODONE) one tsp by mouth two times a day  #4  oz x 0   Entered and Authorized by:   Stacie Glaze MD   Signed by:   Stacie Glaze MD on 03/17/2010   Method used:   Print then Give to Patient   RxID:   [id number] PREDNISONE 20 MG TABS (PREDNISONE) 3 by mouth for 3 days then 2 by mouth for 2 days and then 1 by mouth for 2 day and then 1/2 by mouth for 2 days  #20  x 0   Entered and Authorized by:   Stacie Glaze MD   Signed by:   Stacie Glaze MD on 03/17/2010   Method used:   Electronically to        [facility] [address] #[id number]* (retail)       [address]       [place], [place]  [address]       Ph: [phone]       Fax: [phone]   RxID:   [id number]   Appended Document: Orders Update    Clinical Lists Changes  Orders: Added new Service order of Depo- Medrol 80mg  (J1040) - Signed Added new Service order of Admin of Therapeutic Inj  intramuscular or subcutaneous (47425) - Signed       Medication Administration  Injection # 1:    Medication: Depo- Medrol 80mg     Diagnosis: BRONCHITIS, OBSTRUCTIVE CHRONIC, ACUTE EXACERBATION (ICD-491.21)    Route: IM    Site: LUOQ gluteus    Exp Date: 09/15/2012    Lot #: [id number]    Mfr: [employer]    Comments: 120mg /1.67ml    Patient tolerated injection without complications    Given by: Willy Eddy, LPN (Mar 17, 9562 12:45 PM)  Orders Added: 1)  Depo- Medrol 80mg  [J1040] 2)  Admin of Therapeutic Inj  intramuscular or subcutaneous [[id number]]

## 2010-12-15 NOTE — Assessment & Plan Note (Signed)
Summary: 1 MONTH FUP//CCM   Vital Signs:  Patient profile:   60 year old male Height:      71 inches Weight:      163 pounds BMI:     22.82 Temp:     98.1 degrees F oral Pulse rate:   72 / minute Resp:     12 per minute BP sitting:   132 / 80  (left arm)  Vitals Entered By: Willy Eddy, LPN (February 17, 1609 11:02 AM) CC: roa- pft's last month   CC:  roa- pft's last month.  History of Present Illness: the PFT's did not sugges loss of lung capcity or change is plan for asthma control rishing vigorisly after the inhailer  Asthma History    Asthma Control Assessment:    Age range: 12+ years    Symptoms: 0-2 days/week    Nighttime Awakenings: 0-2/month    Interferes w/ normal activity: no limitations    Asthma Control Assessment: Well Controlled    Preventive Screening-Counseling & Management  Alcohol-Tobacco     Smoking Status: quit  Problems Prior to Update: 1)  Bronchitis, Obstructive Chronic, Acute Exacerbation  (ICD-491.21) 2)  Pharyngitis, Streptococcal  (ICD-034.0) 3)  Acute Prostatitis  (ICD-601.0) 4)  Rash-nonvesicular  (ICD-782.1) 5)  Rash-nonvesicular  (ICD-782.1) 6)  Gonococcal Infection Lower Genitourinary Tract  (ICD-098.0) 7)  Neoplasm, Skin, Uncertain Behavior  (ICD-238.2) 8)  Cellulitis and Abscess of Leg Except Foot  (ICD-682.6) 9)  Rectal Fissure  (ICD-565.0) 10)  External Hemorrhoids With Other Complication  (ICD-455.5) 11)  Allergic Rhinitis  (ICD-477.9) 12)  Rash and Other Nonspecific Skin Eruption  (ICD-782.1) 13)  Abnormal Coagulation Profile  (ICD-790.92) 14)  Contusion of Unspecified Site  (ICD-924.9) 15)  Physical Examination  (ICD-V70.0) 16)  Screening For Unspecified Malignant Neoplasm  (ICD-V76.9) 17)  Osteoarthrosis, Local Nos, Lower Leg  (ICD-715.36) 18)  Sexually Transmitted Disease, Exposure To  (ICD-V01.6) 19)  Pityriasis Rosea  (ICD-696.3) 20)  Gerd  (ICD-530.81) 21)  Asthma  (ICD-493.90)  Current Problems  (verified): 1)  Bronchitis, Obstructive Chronic, Acute Exacerbation  (ICD-491.21) 2)  Pharyngitis, Streptococcal  (ICD-034.0) 3)  Acute Prostatitis  (ICD-601.0) 4)  Rash-nonvesicular  (ICD-782.1) 5)  Rash-nonvesicular  (ICD-782.1) 6)  Gonococcal Infection Lower Genitourinary Tract  (ICD-098.0) 7)  Neoplasm, Skin, Uncertain Behavior  (ICD-238.2) 8)  Cellulitis and Abscess of Leg Except Foot  (ICD-682.6) 9)  Rectal Fissure  (ICD-565.0) 10)  External Hemorrhoids With Other Complication  (ICD-455.5) 11)  Allergic Rhinitis  (ICD-477.9) 12)  Rash and Other Nonspecific Skin Eruption  (ICD-782.1) 13)  Abnormal Coagulation Profile  (ICD-790.92) 14)  Contusion of Unspecified Site  (ICD-924.9) 15)  Physical Examination  (ICD-V70.0) 16)  Screening For Unspecified Malignant Neoplasm  (ICD-V76.9) 17)  Osteoarthrosis, Local Nos, Lower Leg  (ICD-715.36) 18)  Sexually Transmitted Disease, Exposure To  (ICD-V01.6) 19)  Pityriasis Rosea  (ICD-696.3) 20)  Gerd  (ICD-530.81) 21)  Asthma  (ICD-493.90)  Medications Prior to Update: 1)  Valtrex 1 Gm  Tabs (Valacyclovir Hcl) .... Take 1 Tablet By Mouth Once A Day 2)  Veramyst 27.5 Mcg/spray  Susp (Fluticasone Furoate) .Marland Kitchen.. 1 Spray Each Nostril Two Times A Day 3)  Cialis 5 Mg Tabs (Tadalafil) .... One By Mouth Daily 4)  Nexium 40 Mg Cpdr (Esomeprazole Magnesium) .... One By Mouth Two Times A Day 5)  Singulair 10 Mg Tabs (Montelukast Sodium) .Marland Kitchen.. 1 Once Daily 6)  Dulera 200-5 Mcg/act Aero (Mometasone Furo-Formoterol Fum) .... Two Puff Two Times  A Day 7)  Proair Hfa 108 (90 Base) Mcg/act Aers (Albuterol Sulfate) .... 2 Inhalations Two Times A Day Prn 8)  Mucinex 600 Mg Xr12h-Tab (Guaifenesin) .... One By Mouth Two Times A Day  Current Medications (verified): 1)  Valtrex 1 Gm  Tabs (Valacyclovir Hcl) .... Take 1 Tablet By Mouth Once A Day 2)  Veramyst 27.5 Mcg/spray  Susp (Fluticasone Furoate) .Marland Kitchen.. 1 Spray Each Nostril Two Times A Day 3)  Cialis 5 Mg Tabs  (Tadalafil) .... One By Mouth Daily 4)  Nexium 40 Mg Cpdr (Esomeprazole Magnesium) .... One By Mouth Two Times A Day 5)  Singulair 10 Mg Tabs (Montelukast Sodium) .Marland Kitchen.. 1 Once Daily 6)  Dulera 200-5 Mcg/act Aero (Mometasone Furo-Formoterol Fum) .... Two Puff Two Times A Day 7)  Proair Hfa 108 (90 Base) Mcg/act Aers (Albuterol Sulfate) .... 2 Inhalations Two Times A Day Prn 8)  Albuterol Sulfate (2.5 Mg/5ml) 0.083% Nebu (Albuterol Sulfate) .... To Use A 8 Hours As Needed With Nebulizer Prn  Allergies (verified): 1)  ! Pcn 2)  ! 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  Past Medical History: Reviewed history from 08/16/2008 and no changes required. Asthma Herpes GERD Allergic rhinitis  Past Surgical History: Reviewed history from 07/10/07 and no changes required. arthroscopic surgery right knee bx  for granuloma  Family History: Reviewed history from 07-10-2007 and no changes required. father died at 33 MVA mother  Family History of Arthritis Family History Hypertension  Social History: Reviewed history from 07/10/07 and no changes required. Occupation:singer/prof Media planner Former Smoker Alcohol use-yes Drug use-no Regular exercise-yes  Review of Systems  The patient denies anorexia, fever, weight loss, weight gain, vision loss, decreased hearing, hoarseness, chest pain, syncope, dyspnea on exertion, peripheral edema, prolonged cough, headaches, hemoptysis,  abdominal pain, melena, hematochezia, severe indigestion/heartburn, hematuria, incontinence, genital sores, muscle weakness, suspicious skin lesions, transient blindness, difficulty walking, depression, unusual weight change, abnormal bleeding, enlarged lymph nodes, angioedema, and breast masses.    Physical Exam  General:  Well-developed,well-nourished,in no acute distress; alert,appropriate and cooperative throughout examination Head:  normocephalic and atraumatic.   Eyes:  pupils equal and pupils round.   Ears:  R ear normal and L ear normal.   Nose:  External nasal examination shows no deformity or inflammation. Nasal mucosa are pink and moist without lesions or exudates. Neck:  nuchal rigidity.  muscle spasm Lungs:  normal respiratory effort and no wheezes.   Heart:  normal rate and regular rhythm.   Abdomen:  Bowel sounds positive,abdomen soft and non-tender without masses, organomegaly or hernias noted. Msk:  No deformity or scoliosis noted of thoracic or lumbar spine.   Extremities:  No clubbing, cyanosis, edema, or deformity noted with normal full range of motion of all joints.   Neurologic:  alert & oriented X3 and finger-to-nose normal.     Impression & Recommendations:  Problem # 1:  ASTHMA (ICD-493.90)  His updated medication list for this problem includes:    Singulair 10 Mg Tabs (Montelukast sodium) .Marland Kitchen... 1 once daily    Dulera 200-5 Mcg/act Aero (Mometasone  furo-formoterol fum) .Marland Kitchen..Marland Kitchen Two puff two times a day    Proair Hfa 108 (90 Base) Mcg/act Aers (Albuterol sulfate) .Marland Kitchen... 2 inhalations two times a day prn    Albuterol Sulfate (2.5 Mg/29ml) 0.083% Nebu (Albuterol sulfate) .Marland Kitchen... To use a 8 hours as needed with nebulizer prn  Current Asthma Management Plan:  Green Zone:    SINGULAIR 10 MG TABS DULERA 200-5 MCG/ACT AERO Yellow Zone: Peak Flow:      350 to: 450  PROAIR HFA 108 (90 BASE) MCG/ACT AERS:  nebulizer treatment once a day Red: Peak Flow:       <200  PROAIR HFA 108 (90 BASE) MCG/ACT AERS Start Prednisone Taper.    Complete Medication List: 1)  Valtrex 1 Gm Tabs (Valacyclovir hcl) .... Take 1 tablet by mouth once a day 2)  Veramyst 27.5 Mcg/spray Susp (Fluticasone furoate) .Marland Kitchen.. 1 spray each nostril two times a day 3)  Cialis 5 Mg Tabs (Tadalafil) .... One by mouth daily 4)  Nexium 40 Mg Cpdr (Esomeprazole magnesium) .... One by mouth two times a day 5)  Singulair 10 Mg Tabs (Montelukast sodium) .Marland Kitchen.. 1 once daily 6)  Dulera 200-5 Mcg/act Aero (Mometasone furo-formoterol fum) .... Two puff two times a day 7)  Proair Hfa 108 (90 Base) Mcg/act Aers (Albuterol sulfate) .... 2 inhalations two times a day prn 8)  Albuterol Sulfate (2.5 Mg/85ml) 0.083% Nebu (Albuterol sulfate) .... To use a 8 hours as needed with nebulizer prn  Asthma Management Plan    Asthma Severity: Moderate Persistent    Control Assessment: Well Controlled    Plan based on PEF formula: Nunn and Dinah Beers Zone:  SINGULAIR 10 MG TABS DULERA 200-5 MCG/ACT AERO  Yellow Zone: PROAIR HFA 108 (90 BASE) MCG/ACT AERS:  nebulizer treatment once a day  Red Zone: PROAIR HFA 108 (90 BASE) MCG/ACT AERS Start Prednisone Taper.    Patient Instructions: 1)  Please schedule a follow-up appointment in 3 months. Prescriptions: ALBUTEROL SULFATE (2.5 MG/3ML) 0.083% NEBU (ALBUTEROL SULFATE) to use a 8 hours as needed with nebulizer prn  #50 unit/vial x 0   Entered and Authorized by:   Stacie Glaze MD   Signed by:   Stacie Glaze MD on 02/17/2010   Method used:   Electronically to        [facility] #[id number]* (retail)       [address]       [place], [place]  [address]       Ph: [phone]       Fax: [phone]   RxID:   [id number] DULERA 200-5 MCG/ACT AERO (MOMETASONE FURO-FORMOTEROL FUM) two puff two times a day  #1 unit x 11   Entered and Authorized by:   Stacie Glaze MD   Signed by:   Stacie Glaze MD on  02/17/2010   Method used:   Electronically to        [facility] #[id number]* (retail)       [address]       [place], [place]  [address]       Ph: [phone]       Fax: [phone]   RxID:   [id number]

## 2010-12-15 NOTE — Progress Notes (Signed)
Summary: Would like to be seen Today  Phone Note Call from Patient Call back at Home Phone [phone]   Caller: Patient Summary of Call: Pt is not feeling any better would like to be worked in., medication is not helping with sinus drainage and stuffiness. Initial call taken by: Trixie Dredge,  Mar 16, 2010 8:06 AM  Follow-up for Phone Call        appointment given for 8:45 am Follow-up by: Willy Eddy, LPN,  Mar 16, 9146 10:11 AM

## 2010-12-15 NOTE — Miscellaneous (Signed)
Summary: Orders Update  Clinical Lists Changes  Medications: Removed medication of AMOXICILLIN 500 MG CAPS (AMOXICILLIN) 1 three times a day for 10 days Added new medication of CEFACLOR 250 MG CAPS (CEFACLOR) 1 two times a day - Signed Rx of CEFACLOR 250 MG CAPS (CEFACLOR) 1 two times a day;  #20 x 0;  Signed;  Entered by: Willy Eddy, LPN;  Authorized by: Stacie Glaze MD;  Method used: Electronically to [facility] #[id number]*, [address], [place], [place], [place]  [address], Ph: [phone], Fax: [phone]    Prescriptions: CEFACLOR 250 MG CAPS (CEFACLOR) 1 two times a day  #20 x 0   Entered by:   Willy Eddy, LPN   Authorized by:   Stacie Glaze MD   Signed by:   Willy Eddy, LPN on 57/84/6962   Method used:   Electronically to        [facility] [phone]* (retail)       [address]       [place], [place]  [address]       Ph: [phone]       Fax: [phone]   RxID:   [id number]   Appended Document: Orders Update pt allergic to amoxicillin  Appended Document: Orders Update    Clinical Lists Changes  Medications: Changed medication from CEFACLOR 250 MG CAPS (CEFACLOR) 1 two times a day to ZITHROMAX Z-PAK 250 MG TABS (AZITHROMYCIN) take as directed - Signed Rx of ZITHROMAX Z-PAK 250 MG TABS (AZITHROMYCIN) take as directed;  #1 x 0;  Signed;  Entered by: Willy Eddy, LPN;  Authorized by: Stacie Glaze MD;  Method used: Electronically to [facility] #[id number]*, [address] [place], [place], [place]  [address], Ph: [phone], Fax: [phone]    Prescriptions: ZITHROMAX Z-PAK 250 MG TABS (AZITHROMYCIN) take as directed  #1 x 0   Entered by:   Willy Eddy, LPN   Authorized by:   Stacie Glaze MD   Signed by:   Willy Eddy, LPN on 62/37/6283   Method used:   Electronically to        [facility] #[id number]*  (retail)       [address]       [place], [place]  [address]       Ph: [phone]       Fax: [phone]   RxID:   [id number]

## 2010-12-15 NOTE — Letter (Signed)
Summary: [facility]  [facility]   Imported By: Maryln Gottron 10/15/2010 10:48:17  _____________________________________________________________________  External Attachment:    Type:   Image     Comment:   External Document

## 2010-12-15 NOTE — Assessment & Plan Note (Signed)
Summary: 3 month fup//ccm   Vital Signs:  Patient profile:   60 year old male Height:      71 inches Weight:      163 pounds BMI:     22.82 Temp:     98.2 degrees F oral Pulse rate:   76 / minute Resp:     14 per minute BP sitting:   136 / 80  (left arm)  Vitals Entered By: Willy Eddy, LPN (January 19, 8118 8:09 AM) CC: roa- check spot on leg   CC:  roa- check spot on leg.  History of Present Illness: Asthma has been "rough" with multiple flairs was seen at [facility] for "fungal infection" around vocal cords has increased nebulizer use to daily still running 3 miles a day?  Asthma History    Initial Asthma Severity Rating:    Age range: 12+ years    Symptoms: >2 days/week; not daily    Nighttime Awakenings: >1/week but not nightly    Interferes w/ normal activity: minor limitations    Asthma Severity Assessment: Moderate Persistent    Preventive Screening-Counseling & Management  Alcohol-Tobacco     Smoking Status: quit  Problems Prior to Update: 1)  Bronchitis, Obstructive Chronic, Acute Exacerbation  (ICD-491.21) 2)  Pharyngitis, Streptococcal  (ICD-034.0) 3)  Acute Prostatitis  (ICD-601.0) 4)  Rash-nonvesicular  (ICD-782.1) 5)  Rash-nonvesicular  (ICD-782.1) 6)  Gonococcal Infection Lower Genitourinary Tract  (ICD-098.0) 7)  Neoplasm, Skin, Uncertain Behavior  (ICD-238.2) 8)  Cellulitis and Abscess of Leg Except Foot  (ICD-682.6) 9)  Rectal Fissure  (ICD-565.0) 10)  External Hemorrhoids With Other Complication  (ICD-455.5) 11)  Allergic Rhinitis  (ICD-477.9) 12)  Rash and Other Nonspecific Skin Eruption  (ICD-782.1) 13)  Abnormal Coagulation Profile  (ICD-790.92) 14)  Contusion of Unspecified Site  (ICD-924.9) 15)  Physical Examination  (ICD-V70.0) 16)  Screening For Unspecified Malignant Neoplasm  (ICD-V76.9) 17)  Osteoarthrosis, Local Nos, Lower Leg  (ICD-715.36) 18)  Sexually Transmitted Disease, Exposure To  (ICD-V01.6) 19)  Pityriasis Rosea   (ICD-696.3) 20)  Gerd  (ICD-530.81) 21)  Asthma  (ICD-493.90)  Current Problems (verified): 1)  Bronchitis, Obstructive Chronic, Acute Exacerbation  (ICD-491.21) 2)  Pharyngitis, Streptococcal  (ICD-034.0) 3)  Acute Prostatitis  (ICD-601.0) 4)  Rash-nonvesicular  (ICD-782.1) 5)  Rash-nonvesicular  (ICD-782.1) 6)  Gonococcal Infection Lower Genitourinary Tract  (ICD-098.0) 7)  Neoplasm, Skin, Uncertain Behavior  (ICD-238.2) 8)  Cellulitis and Abscess of Leg Except Foot  (ICD-682.6) 9)  Rectal Fissure  (ICD-565.0) 10)  External Hemorrhoids With Other Complication  (ICD-455.5) 11)  Allergic Rhinitis  (ICD-477.9) 12)  Rash and Other Nonspecific Skin Eruption  (ICD-782.1) 13)  Abnormal Coagulation Profile  (ICD-790.92) 14)  Contusion of Unspecified Site  (ICD-924.9) 15)  Physical Examination  (ICD-V70.0) 16)  Screening For Unspecified Malignant Neoplasm  (ICD-V76.9) 17)  Osteoarthrosis, Local Nos, Lower Leg  (ICD-715.36) 18)  Sexually Transmitted Disease, Exposure To  (ICD-V01.6) 19)  Pityriasis Rosea  (ICD-696.3) 20)  Gerd  (ICD-530.81) 21)  Asthma  (ICD-493.90)  Medications Prior to Update: 1)  Valtrex 1 Gm  Tabs (Valacyclovir Hcl) .... Take 1 Tablet By Mouth Once A Day 2)  Veramyst 27.5 Mcg/spray  Susp (Fluticasone Furoate) .Marland Kitchen.. 1 Spray Each Nostril Two Times A Day 3)  Cialis 5 Mg Tabs (Tadalafil) .... One By Mouth Daily 4)  Nexium 40 Mg Cpdr (Esomeprazole Magnesium) .... One By Mouth Two Times A Day 5)  Singulair 10 Mg Tabs (Montelukast Sodium) .Marland KitchenMarland KitchenMarland Kitchen 1  Once Daily 6)  Symbicort 160-4.5 Mcg/act Aero (Budesonide-Formoterol Fumarate) .... 2 Puffs Two Times A Day 7)  Proair Hfa 108 (90 Base) Mcg/act Aers (Albuterol Sulfate) .... 2 Inhalations Two Times A Day Prn 8)  Zyrtec Hives Relief 10 Mg Tabs (Cetirizine Hcl) .Marland Kitchen.. 1 Once Daily 9)  Azithromycin 250 Mg Tabs (Azithromycin) .... Two By Mouth Now Then One By Mouth Daily For 4 Days 10)  Allerx-D 120-2.5 Mg Xr12h-Tab  (Pseudoephedrine-Methscopolamin) .... One By Mouth Two Times A Day  Current Medications (verified): 1)  Valtrex 1 Gm  Tabs (Valacyclovir Hcl) .... Take 1 Tablet By Mouth Once A Day 2)  Veramyst 27.5 Mcg/spray  Susp (Fluticasone Furoate) .Marland Kitchen.. 1 Spray Each Nostril Two Times A Day 3)  Cialis 5 Mg Tabs (Tadalafil) .... One By Mouth Daily 4)  Nexium 40 Mg Cpdr (Esomeprazole Magnesium) .... One By Mouth Two Times A Day 5)  Singulair 10 Mg Tabs (Montelukast Sodium) .Marland Kitchen.. 1 Once Daily 6)  Advair Diskus 250-50 Mcg/dose Aepb (Fluticasone-Salmeterol) .... One Puf By Mouth Two Times A Day 7)  Proair Hfa 108 (90 Base) Mcg/act Aers (Albuterol Sulfate) .... 2 Inhalations Two Times A Day Prn 8)  Mucinex 600 Mg Xr12h-Tab (Guaifenesin) .... One By Mouth Two Times A Day  Allergies (verified): 1)  ! Pcn 2)  ! 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  Past Medical History: Reviewed history from 08/16/2008 and no changes required. Asthma Herpes GERD Allergic rhinitis  Past Surgical History: Reviewed history from Jul 17, 2007 and no changes required. arthroscopic surgery right knee bx  for granuloma  Family History: Reviewed history from 2007/07/17 and no changes required. father died at 8 MVA mother  Family History of Arthritis Family History Hypertension  Social History: Reviewed history from 2007-07-17 and no changes required. Occupation:singer/prof Media planner Former Smoker Alcohol  use-yes Drug use-no Regular exercise-yes  Review of Systems  The patient denies anorexia, fever, weight loss, weight gain, vision loss, decreased hearing, hoarseness, chest pain, syncope, dyspnea on exertion, peripheral edema, prolonged cough, headaches, hemoptysis, abdominal pain, melena, hematochezia, severe indigestion/heartburn, hematuria, incontinence, genital sores, muscle weakness, suspicious skin lesions, transient blindness, difficulty walking, depression, unusual weight change, abnormal bleeding, enlarged lymph nodes, angioedema, and breast masses.    Physical Exam  General:  Well-developed,well-nourished,in no acute distress; alert,appropriate and cooperative throughout examination Head:  normocephalic and atraumatic.   Eyes:  pupils equal and pupils round.   Ears:  R ear normal and L ear normal.   Nose:  External nasal examination shows no deformity or inflammation. Nasal mucosa are pink and moist without lesions or exudates. Mouth:  pharyngeal erythema, pharyngeal exudate, and posterior lymphoid hypertrophy.   Neck:  nuchal rigidity.  muscle spasm Lungs:  normal respiratory effort and no wheezes.   Heart:  normal rate and regular rhythm.   Abdomen:  Bowel sounds positive,abdomen soft and non-tender without masses, organomegaly or hernias noted. Msk:  No deformity or scoliosis noted of thoracic or lumbar spine.   Extremities:  No clubbing, cyanosis, edema, or deformity noted with normal full range of motion of all  joints.     Impression & Recommendations:  Problem # 1:  ASTHMA (ICD-493.90) Assessment Deteriorated  His updated medication list for this problem includes:    Singulair 10 Mg Tabs (Montelukast sodium) .Marland Kitchen... 1 once daily    Dulera 200-5 Mcg/act Aero (Mometasone furo-formoterol fum) .Marland Kitchen..Marland Kitchen Two puff two times a day    Proair Hfa 108 (90 Base) Mcg/act Aers (Albuterol sulfate) .Marland Kitchen... 2 inhalations two times a day prn  Current Asthma Management Plan:  Green  Zone:   SYMBICORT 160-4.5 MCG/ACT AERO:  1 inhalation twice a day SINGULAIR 10 MG TABS Yellow Zone: Peak Flow:      350 to: 450  PROAIR HFA 108 (90 BASE) MCG/ACT AERS:  nebulizer treatment once a day Red: Peak Flow:      <200  PROAIR HFA 108 (90 BASE) MCG/ACT AERS Start Prednisone Taper.   next step is pumonary functions for assement  Orders: Pulmonary Referral (Pulmonary)  Problem # 2:  ALLERGIC RHINITIS (ICD-477.9)  The following medications were removed from the medication list:    Zyrtec Hives Relief 10 Mg Tabs (Cetirizine hcl) .Marland Kitchen... 1 once daily His updated medication list for this problem includes:    Veramyst 27.5 Mcg/spray Susp (Fluticasone furoate) .Marland Kitchen... 1 spray each nostril two times a day  Discussed use of allergy medications and environmental measures.   Complete Medication List: 1)  Valtrex 1 Gm Tabs (Valacyclovir hcl) .... Take 1 tablet by mouth once a day 2)  Veramyst 27.5 Mcg/spray Susp (Fluticasone furoate) .Marland Kitchen.. 1 spray each nostril two times a day 3)  Cialis 5 Mg Tabs (Tadalafil) .... One by mouth daily 4)  Nexium 40 Mg Cpdr (Esomeprazole magnesium) .... One by mouth two times a day 5)  Singulair 10 Mg Tabs (Montelukast sodium) .Marland Kitchen.. 1 once daily 6)  Dulera 200-5 Mcg/act Aero (Mometasone furo-formoterol fum) .... Two puff two times a day 7)  Proair Hfa 108 (90 Base) Mcg/act Aers (Albuterol sulfate) .... 2 inhalations two times a day prn 8)  Mucinex 600 Mg Xr12h-tab (Guaifenesin) .... One by mouth two times a day  Asthma Management Plan    Asthma Severity: Moderate Persistent    Plan based on PEF formula: Nunn and Dinah Beers Zone: SYMBICORT 160-4.5 MCG/ACT AERO:  1 inhalation twice a day SINGULAIR 10 MG TABS  Yellow Zone: PROAIR HFA 108 (90 BASE) MCG/ACT AERS:  nebulizer treatment once a day  Red Zone: PROAIR HFA 108 (90 BASE) MCG/ACT AERS Start Prednisone Taper.    Patient Instructions: 1)  Please schedule a follow-up appointment in 1 month.

## 2010-12-15 NOTE — Assessment & Plan Note (Signed)
Summary: uri/bmw   Vital Signs:  Patient profile:   60 year old male Height:      71 inches Weight:      159 pounds BMI:     22.26 Temp:     98.6 degrees F oral Pulse rate:   76 / minute Resp:     14 per minute BP sitting:   130 / 80  (left arm)  Vitals Entered By: Willy Eddy, LPN (November 24, 2009 2:06 PM) CC: c/o sore throat and earache, URI symptoms   CC:  c/o sore throat and earache and URI symptoms.  History of Present Illness:  URI Symptoms      This is a 60 year old man who presents with URI symptoms.  swollen uvulae and ear pain for 6 dyas.  The patient reports nasal congestion and productive cough, but denies clear nasal discharge, purulent nasal discharge, sore throat, earache, and sick contacts.  The patient denies fever, low-grade fever (<100.5 degrees), fever of 100.5-103 degrees, fever of 103.1-104 degrees, fever to >104 degrees, stiff neck, dyspnea, wheezing, rash, vomiting, diarrhea, use of an antipyretic, and response to antipyretic.  The patient also reports headache and severe fatigue.  The patient denies the following risk factors for Strep sinusitis: unilateral facial pain, unilateral nasal discharge, poor response to decongestant, double sickening, tooth pain, Strep exposure, tender adenopathy, and absence of cough.    Problems Prior to Update: 1)  Acute Prostatitis  (ICD-601.0) 2)  Rash-nonvesicular  (ICD-782.1) 3)  Rash-nonvesicular  (ICD-782.1) 4)  Gonococcal Infection Lower Genitourinary Tract  (ICD-098.0) 5)  Neoplasm, Skin, Uncertain Behavior  (ICD-238.2) 6)  Cellulitis and Abscess of Leg Except Foot  (ICD-682.6) 7)  Rectal Fissure  (ICD-565.0) 8)  External Hemorrhoids With Other Complication  (ICD-455.5) 9)  Allergic Rhinitis  (ICD-477.9) 10)  Rash and Other Nonspecific Skin Eruption  (ICD-782.1) 11)  Abnormal Coagulation Profile  (ICD-790.92) 12)  Contusion of Unspecified Site  (ICD-924.9) 13)  Physical Examination  (ICD-V70.0) 14)   Screening For Unspecified Malignant Neoplasm  (ICD-V76.9) 15)  Osteoarthrosis, Local Nos, 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 Take 1 Tablet By Mouth Once A Day 2)  Veramyst 27.5 Mcg/spray  Susp (Fluticasone Furoate) .Marland Kitchen.. 1 Spray Each Nostril Two Times A Day 3)  Cialis 5 Mg Tabs (Tadalafil) .... One By Mouth Daily 4)  Nexium 40 Mg Cpdr (Esomeprazole Magnesium) .... One By Mouth Once Daily 5)  Singulair 10 Mg Tabs (Montelukast Sodium) .Marland Kitchen.. 1 Once Daily 6)  Symbicort 160-4.5 Mcg/act Aero (Budesonide-Formoterol Fumarate) .... 2 Puffs Two Times A Day 7)  Proair Hfa 108 (90 Base) Mcg/act Aers (Albuterol Sulfate) .... 2 Inhalations Two Times A Day Prn 8)  Zyrtec Hives Relief 10 Mg Tabs (Cetirizine Hcl) .Marland Kitchen.. 1 Once Daily 9)  Ciprofloxacin Hcl 500 Mg Tabs (Ciprofloxacin Hcl) .... One By Mouth Two Times A Day Fpor 14 Days  Current Medications (verified): 1)  Valtrex 1 Gm  Tabs (Valacyclovir Hcl) .... Take 1 Tablet By Mouth Once A Day 2)  Veramyst 27.5 Mcg/spray  Susp (Fluticasone Furoate) .Marland Kitchen.. 1 Spray Each Nostril Two Times A Day 3)  Cialis 5 Mg Tabs (Tadalafil) .... One By Mouth Daily 4)  Nexium 40 Mg Cpdr (Esomeprazole Magnesium) .... One By Mouth Two Times A  Day 5)  Singulair 10 Mg Tabs (Montelukast Sodium) .Marland Kitchen.. 1 Once Daily 6)  Symbicort 160-4.5 Mcg/act Aero (Budesonide-Formoterol Fumarate) .... 2 Puffs Two Times A Day 7)  Proair Hfa 108 (90 Base) Mcg/act Aers (Albuterol Sulfate) .... 2 Inhalations Two Times A Day Prn 8)  Zyrtec Hives Relief 10 Mg Tabs (Cetirizine Hcl) .Marland Kitchen.. 1 Once Daily 9)  Cephalexin 500 Mg Caps (Cephalexin) .... One By Mouth Three Times A Day For 10 Days  Allergies (verified): 1)  ! Pcn 2)  ! 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  Past Medical History: Reviewed history from 08/16/2008 and no changes required. Asthma Herpes GERD Allergic rhinitis  Past Surgical History: Reviewed history from 07/12/07 and no changes required. arthroscopic surgery right knee bx  for granuloma  Family History: Reviewed history from 12-Jul-2007 and no changes required. father died at 65 MVA mother  Family History of Arthritis Family History Hypertension  Social History: Reviewed history from 2007-07-12 and no changes required. 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  The patient denies anorexia, fever, weight loss, weight gain, vision loss, decreased hearing, hoarseness, chest pain, syncope, dyspnea on exertion, peripheral edema, prolonged cough, headaches, abdominal pain, melena, hematochezia, severe indigestion/heartburn, hematuria, incontinence, genital sores, muscle weakness, suspicious skin lesions, transient blindness, difficulty walking, depression, unusual weight change, abnormal bleeding, enlarged lymph nodes, angioedema, breast masses, and testicular masses.    Physical Exam  General:  Well-developed,well-nourished,in no acute distress; alert,appropriate and cooperative throughout examination Head:  normocephalic and atraumatic.   Eyes:  pupils equal and pupils round.   Ears:  R ear normal and L ear normal.     Nose:  External nasal examination shows no deformity or inflammation. Nasal mucosa are pink and moist without lesions or exudates. Mouth:  pharyngeal erythema, pharyngeal exudate, and posterior lymphoid hypertrophy.   Neck:  nuchal rigidity.  muscle spasm Lungs:  normal respiratory effort and no wheezes.   Heart:  normal rate and regular rhythm.   Abdomen:  Bowel sounds positive,abdomen soft and non-tender without masses, organomegaly or hernias noted. Extremities:  No clubbing, cyanosis, edema, or deformity noted with normal full range of motion of all joints.   Neurologic:  alert & oriented X3 and finger-to-nose normal.     Impression & Recommendations:  Problem # 1:  PHARYNGITIS, STREPTOCOCCAL (ICD-034.0) the pt had symptom of strept The following medications were removed from the medication list:    Ciprofloxacin Hcl 500 Mg Tabs (Ciprofloxacin hcl) ..... One by mouth two times a day fpor 14 days His updated medication list for this problem includes:    Cephalexin 500 Mg Caps (Cephalexin) ..... One by mouth three times a day for 10 days  Instructed to complete antibiotics and call if not improved in 48 hours.   Problem # 2:  GERD (ICD-530.81) Assessment: Deteriorated hanving increased GERD His updated medication list for this problem includes:    Nexium 40 Mg Cpdr (Esomeprazole magnesium) ..... One by mouth two  times a day  Labs Reviewed: Hgb: 15.0 (05/23/2009)   Hct: 42.4 (05/23/2009)  Problem # 3:  ASTHMA (ICD-493.90) stable His updated medication list for this problem includes:    Singulair 10 Mg Tabs (Montelukast sodium) .Marland Kitchen... 1 once daily    Symbicort 160-4.5 Mcg/act Aero (Budesonide-formoterol fumarate) .Marland Kitchen... 2 puffs two times a day    Proair Hfa 108 (90 Base) Mcg/act Aers (Albuterol sulfate) .Marland Kitchen... 2 inhalations two times a day prn  Complete Medication List: 1)  Valtrex 1 Gm Tabs (Valacyclovir hcl) .... Take 1 tablet by mouth once a day 2)  Veramyst 27.5 Mcg/spray  Susp (Fluticasone furoate) .Marland Kitchen.. 1 spray each nostril two times a day 3)  Cialis 5 Mg Tabs (Tadalafil) .... One by mouth daily 4)  Nexium 40 Mg Cpdr (Esomeprazole magnesium) .... One by mouth two times a day 5)  Singulair 10 Mg Tabs (Montelukast sodium) .Marland Kitchen.. 1 once daily 6)  Symbicort 160-4.5 Mcg/act Aero (Budesonide-formoterol fumarate) .... 2 puffs two times a day 7)  Proair Hfa 108 (90 Base) Mcg/act Aers (Albuterol sulfate) .... 2 inhalations two times a day prn 8)  Zyrtec Hives Relief 10 Mg Tabs (Cetirizine hcl) .Marland Kitchen.. 1 once daily 9)  Cephalexin 500 Mg Caps (Cephalexin) .... One by mouth three times a day for 10 days  Patient Instructions: 1)  Take your antibiotic as prescribed until ALL of it is gone, but stop if you develop a rash or swelling and contact our office as soon as possible. Prescriptions: NEXIUM 40 MG CPDR (ESOMEPRAZOLE MAGNESIUM) one by mouth two times a day  #60 x 11   Entered and Authorized by:   Stacie Glaze MD   Signed by:   Stacie Glaze MD on 11/24/2009   Method used:   Electronically to        [facility] #[id number]* (retail)       [address]       [place], [place]  [address]       Ph: [phone]       Fax: [phone]   RxID:   [id number] CEPHALEXIN 500 MG CAPS (CEPHALEXIN) one by mouth three times a day for 10 days  #30 x 0   Entered and Authorized by:   Stacie Glaze MD   Signed by:   Stacie Glaze MD on 11/24/2009   Method used:   Electronically to        [facility] #[id number]* (retail)       [address]       [place], [place]  [address]       Ph: [phone]       Fax: [phone]   RxID:   [id number]

## 2010-12-15 NOTE — Progress Notes (Signed)
Summary: [facility] says Allerx-D has been discontinued  Phone Note From Pharmacy Call back at [phone] [facility]   Caller: [facility] #[id number]* Summary of Call: [facility] pharmacy called and said that Allerx -D has been discontinued. Pt req alternative med. Please advise.  Initial call taken by: Lucy Antigua,  December 24, 2009 1:31 PM  Follow-up for Phone Call        per d [email or web address] to substitute for something similar Follow-up by: Willy Eddy, LPN,  December 24, 2009 2:17 PM  Additional Follow-up for Phone Call Additional follow up Details #1::        Left message to inform pharmacy. Additional Follow-up by: Rudy Jew, RN,  December 24, 2009 2:20 PM

## 2010-12-15 NOTE — Progress Notes (Signed)
Summary: pt wants work in appt with Dr Lovell Sheehan today  Phone Note Call from Patient Call back at [facility] Phone [phone]   Caller: Patient Summary of Call: Pt req work in appt with Dr. Lovell Sheehan. Pt sick with cough,sinus, sorethroat, breathing diff.  Initial call taken by: Lucy Antigua,  March 13, 2010 8:03 AM  Follow-up for Phone Call        [facility]-4mg  medrol dose pack.,hicodan 1 tsp every 8 hours 6 oz, and amoxicilline 500 three times a day for 10 days per dr Lovell Sheehan Follow-up by: Willy Eddy, LPN,  March 13, 2010 9:20 AM    New/Updated Medications: MEDROL (PAK) 4 MG TABS (METHYLPREDNISOLONE) take as directed AMOXICILLIN 500 MG CAPS (AMOXICILLIN) 1 three times a day for 10 days HYDROMET 5-1.5 MG/5ML SYRP (HYDROCODONE-HOMATROPINE) 1 tsp every 6 hours prh cough Prescriptions: HYDROMET 5-1.5 MG/5ML SYRP (HYDROCODONE-HOMATROPINE) 1 tsp every 6 hours prh cough  #6oz x 0   Entered by:   Willy Eddy, LPN   Authorized by:   Stacie Glaze MD   Signed by:   Willy Eddy, LPN on 09/81/1914   Method used:   Telephoned to ...       [facility] #[id number]* (retail)       [address]       [place], [place]  [address]       Ph: [phone]       Fax: [phone]   RxID:   [id number] AMOXICILLIN 500 MG CAPS (AMOXICILLIN) 1 three times a day for 10 days  #30 x 0   Entered by:   Willy Eddy, LPN   Authorized by:   Stacie Glaze MD   Signed by:   Willy Eddy, LPN on 42/59/5638   Method used:   Electronically to        [facility] #[id number]* (retail)       [address]       [place], [place]  [address]       Ph: [phone]       Fax: [phone]   RxID:   [id number] MEDROL (PAK) 4 MG TABS (METHYLPREDNISOLONE) take as directed  #1 x 0   Entered by:   Willy Eddy, LPN   Authorized by:   Stacie Glaze MD   Signed by:   Willy Eddy, LPN on 62/83/1517   Method used:   Electronically to        [facility] #[id number]* (retail)       [address]       [place], [place]  [address]       Ph: [phone]       Fax: [phone]   RxID:   [id number]

## 2010-12-15 NOTE — Progress Notes (Signed)
Summary: wants ov today with dr Lovell Sheehan  Phone Note Call from Patient Call back at Work Phone [phone]   Caller: Patient-live call Reason for Call: Talk to Nurse, Lab or Test Results Summary of Call: Wants ov today. Sore throat, ear cloggness, chest congestion. Initial call taken by: Warnell Forester,  November 24, 2009 8:35 AM  Follow-up for Phone Call        give apointment Follow-up by: Stacie Glaze MD,  November 24, 2009 8:44 AM  Additional Follow-up for Phone Call Additional follow up Details #1::        ap[pointment given and pt notified Additional Follow-up by: Willy Eddy, LPN,  November 24, 2009 8:47 AM

## 2010-12-15 NOTE — Assessment & Plan Note (Signed)
Summary: cpx/njr   Vital Signs:  Patient profile:   60 year old male Height:      71 inches Weight:      166 pounds BMI:     23.24 Temp:     98.2 degrees F oral Pulse rate:   76 / minute Resp:     14 per minute BP sitting:   132 / 80  (left arm)  Vitals Entered By: Willy Eddy, LPN (June 26, 2010 10:39 AM) CC: cpx Is Patient Diabetic? No   CC:  cpx.  History of Present Illness: The pt was asked about all immunizations, health maint. services that are appropriate to their age and was given guidance on diet exercize  and weight management  acute complaints of plantar facitis has difficulting putting pressure on the foot walking with the left foot pain caused right hip pain varicosity in the left foot on the dorsal surface  has been using the pennsaid  left knee pain  with hs of OA and "mice" in the knee and stiffness suggesting  meniscal tear  Preventive Screening-Counseling & Management  Alcohol-Tobacco     Smoking Status: quit  Problems Prior to Update: 1)  Fasciitis, Plantar  (ICD-728.71) 2)  Bronchitis, Obstructive Chronic, Acute Exacerbation  (ICD-491.21) 3)  Pharyngitis, Streptococcal  (ICD-034.0) 4)  Acute Prostatitis  (ICD-601.0) 5)  Rash-nonvesicular  (ICD-782.1) 6)  Rash-nonvesicular  (ICD-782.1) 7)  Gonococcal Infection Lower Genitourinary Tract  (ICD-098.0) 8)  Neoplasm, Skin, Uncertain Behavior  (ICD-238.2) 9)  Cellulitis and Abscess of Leg Except Foot  (ICD-682.6) 10)  Rectal Fissure  (ICD-565.0) 11)  External Hemorrhoids With Other Complication  (ICD-455.5) 12)  Allergic Rhinitis  (ICD-477.9) 13)  Rash and Other Nonspecific Skin Eruption  (ICD-782.1) 14)  Abnormal Coagulation Profile  (ICD-790.92) 15)  Contusion of Unspecified Site  (ICD-924.9) 16)  Physical Examination  (ICD-V70.0) 17)  Screening For Unspecified Malignant Neoplasm  (ICD-V76.9) 18)  Osteoarthrosis, Local Nos, Lower Leg  (ICD-715.36) 19)  Sexually Transmitted Disease,  Exposure To  (ICD-V01.6) 20)  Pityriasis Rosea  (ICD-696.3) 21)  Gerd  (ICD-530.81) 22)  Asthma  (ICD-493.90)  Current Problems (verified): 1)  Fasciitis, Plantar  (ICD-728.71) 2)  Bronchitis, Obstructive Chronic, Acute Exacerbation  (ICD-491.21) 3)  Pharyngitis, Streptococcal  (ICD-034.0) 4)  Acute Prostatitis  (ICD-601.0) 5)  Rash-nonvesicular  (ICD-782.1) 6)  Rash-nonvesicular  (ICD-782.1) 7)  Gonococcal Infection Lower Genitourinary Tract  (ICD-098.0) 8)  Neoplasm, Skin, Uncertain Behavior  (ICD-238.2) 9)  Cellulitis and Abscess of Leg Except Foot  (ICD-682.6) 10)  Rectal Fissure  (ICD-565.0) 11)  External Hemorrhoids With Other Complication  (ICD-455.5) 12)  Allergic Rhinitis  (ICD-477.9) 13)  Rash and Other Nonspecific Skin Eruption  (ICD-782.1) 14)  Abnormal Coagulation Profile  (ICD-790.92) 15)  Contusion of Unspecified Site  (ICD-924.9) 16)  Physical Examination  (ICD-V70.0) 17)  Screening For Unspecified Malignant Neoplasm  (ICD-V76.9) 18)  Osteoarthrosis, Local Nos, Lower Leg  (ICD-715.36) 19)  Sexually Transmitted Disease, Exposure To  (ICD-V01.6) 20)  Pityriasis Rosea  (ICD-696.3) 21)  Gerd  (ICD-530.81) 22)  Asthma  (ICD-493.90)  Medications Prior to Update: 1)  Valtrex 1 Gm  Tabs (Valacyclovir Hcl) .... Take 1 Tablet By Mouth Once A Day 2)  Veramyst 27.5 Mcg/spray  Susp (Fluticasone Furoate) .Marland Kitchen.. 1 Spray Each Nostril Two Times A Day 3)  Cialis 5 Mg Tabs (Tadalafil) .... One By Mouth Daily 4)  Nexium 40 Mg Cpdr (Esomeprazole Magnesium) .... One By Mouth Two Times A Day 5)  Singulair 10 Mg Tabs (Montelukast Sodium) .Marland Kitchen.. 1 Once Daily 6)  Dulera 200-5 Mcg/act Aero (Mometasone Furo-Formoterol Fum) .... Two Puff Two Times A Day 7)  Proair Hfa 108 (90 Base) Mcg/act Aers (Albuterol Sulfate) .... 2 Inhalations Two Times A Day Prn 8)  Albuterol Sulfate (2.5 Mg/79ml) 0.083% Nebu (Albuterol Sulfate) .... To Use A 8 Hours As Needed With Nebulizer Prn 9)  Tussionex Pennkinetic  Er 8-10 Mg/47ml Lqcr (Chlorpheniramine-Hydrocodone) .... One Tsp By Mouth Two Times A Day 10)  Prednisone 20 Mg Tabs (Prednisone) .... 3 By Mouth For 3 Days Then 2 By Mouth For 2 Days and Then 1 By Mouth For 2 Day and Then 1/2 By Mouth For 2 Days 11)  Mucinex D 60-600 Mg Xr12h-Tab (Pseudoephedrine-Guaifenesin) .... One Totwo By Mouth Two Times A Day 12)  Fluconazole 100 Mg Tabs (Fluconazole) .... One By Mouth Daily For 7 Days Post The Prednisone 13)  Levaquin 750 Mg Tabs (Levofloxacin) .... One By Mouth Q Day 14)  Hydrocod Polst-Chlorphen Polst 10-8 Mg/21ml Lqcr (Chlorpheniramine-Hydrocodone) .... Use As Directed  Current Medications (verified): 1)  Valtrex 1 Gm  Tabs (Valacyclovir Hcl) .... Take 1 Tablet By Mouth Once A Day 2)  Veramyst 27.5 Mcg/spray  Susp (Fluticasone Furoate) .Marland Kitchen.. 1 Spray Each Nostril Two Times A Day 3)  Cialis 5 Mg Tabs (Tadalafil) .... One By Mouth Daily 4)  Nexium 40 Mg Cpdr (Esomeprazole Magnesium) .... One By Mouth Two Times A Day 5)  Singulair 10 Mg Tabs (Montelukast Sodium) .Marland Kitchen.. 1 Once Daily 6)  Dulera 200-5 Mcg/act Aero (Mometasone Furo-Formoterol Fum) .... Two Puff Two Times A Day 7)  Proair Hfa 108 (90 Base) Mcg/act Aers (Albuterol Sulfate) .... 2 Inhalations Two Times A Day Prn 8)  Albuterol Sulfate (2.5 Mg/18ml) 0.083% Nebu (Albuterol Sulfate) .... To Use A 8 Hours As Needed With Nebulizer Prn  Allergies (verified): 1)  ! Pcn 2)  ! 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  Past Medical History: Reviewed history from 08/16/2008 and no changes required. Asthma Herpes GERD Allergic rhinitis  Past Surgical History: Reviewed history from 04-Jul-2007 and no changes required. arthroscopic surgery right knee bx  for granuloma  Family History: Reviewed history from Jul 04, 2007 and no changes required. father died at 64 MVA mother  Family History of Arthritis Family History Hypertension  Social History: Reviewed history from 2007/07/04 and no changes required. Occupation:singer/prof Media planner Former Smoker Alcohol use-yes Drug use-no Regular exercise-yes  Review of Systems  The patient denies anorexia, fever, weight loss, weight gain, vision loss, decreased hearing, hoarseness, chest pain, syncope, dyspnea on exertion, peripheral edema, prolonged cough, headaches, hemoptysis, abdominal pain, melena, hematochezia, severe indigestion/heartburn, hematuria, incontinence, genital sores, muscle weakness, suspicious skin lesions, transient blindness, difficulty walking, depression, unusual weight change, abnormal bleeding, enlarged lymph nodes, angioedema, and breast masses.    Physical Exam  General:  Well-developed,well-nourished,in no acute distress; alert,appropriate and cooperative throughout examination Head:  normocephalic and no abnormalities observed.   Eyes:  pupils equal and pupils round.   Ears:  R ear normal and L ear normal.  Nose:  no external deformity and no nasal discharge.   Mouth:  pharyngeal erythema, pharyngeal exudate, and posterior lymphoid hypertrophy.   Neck:  nuchal rigidity.  muscle spasm Lungs:  normal respiratory effort and no wheezes.   Heart:  normal rate and regular rhythm.   Abdomen:  soft, non-tender, and normal bowel sounds.   Msk:  normal ROM, no joint tenderness, and no joint swelling.   Extremities:  tender over plantar fascia proximally.  No visible swelling or erythema.  No achilles tenderness.  No bony tenderness in foot or ankle. Neurologic:  full strength with plantar and dorsi flexion. Skin:  dry skin with slight difuse macular eruption Cervical Nodes:  No lymphadenopathy noted Axillary Nodes:  No palpable lymphadenopathy Inguinal Nodes:  No significant adenopathy Psych:  Cognition and judgment appear intact. Alert and cooperative with normal attention span and concentration. No apparent delusions, illusions, hallucinations   Impression & Recommendations:  Problem # 1:  FASCIITIS, PLANTAR (ICD-728.71)  wear tennis shoe and stretch consider referral to poditrist  Discussed use of gel inserts, ice massage, and stretching exercises.   Problem # 2:  PHYSICAL EXAMINATION (ICD-V70.0) The pt was asked about all immunizations, health maint. services that are appropriate to their age and was given guidance on diet exercize  and weight management  Colonoscopy: Location:  [facility].   (04/02/2008) Td Booster: Tdap (06/15/2003)   Flu Vax: Historical (09/15/2009)   Chol: 204 (06/19/2010)   HDL: 63.00 (06/19/2010)   LDL: 111 (05/23/2009)   TG: 63.0 (06/19/2010) TSH: 1.39 (06/19/2010)   PSA: 0.20 (06/19/2010) Next Colonoscopy due:: 03/2018 (04/02/2008)  Discussed using sunscreen, use of alcohol, drug use, self testicular exam, routine dental care, routine eye care, routine physical exam, seat belts, multiple vitamins, osteoporosis prevention, adequate calcium intake in diet, and recommendations for immunizations.  Discussed exercise and checking cholesterol.  Discussed gun safety, safe sex, and contraception. Also recommend checking PSA.  Problem # 3:  ALLERGIC RHINITIS (ICD-477.9) chronic use His updated medication list for this problem includes:    Veramyst 27.5 Mcg/spray Susp (Fluticasone furoate) .Marland Kitchen... 1 spray each nostril two times a day  Discussed use of allergy medications and environmental measures.   Problem # 4:  OSTEOARTHROSIS, LOCAL NOS, LOWER LEG  (ICD-715.36) Informed consen obtained and then the joint was prepped in a sterile manor and 40 mg depo and 1/2 cc 1% lidocaine injected into the synovial space. After care discussed. Pt tolerated procedure well. the left knee Discussed use of medications, application of heat or cold, and exercises.  see orders  Complete Medication List: 1)  Valtrex 1 Gm Tabs (Valacyclovir hcl) .... Take 1 tablet by mouth once a day 2)  Veramyst 27.5 Mcg/spray Susp (Fluticasone furoate) .Marland Kitchen.. 1 spray each nostril two times a day 3)  Cialis 5 Mg Tabs (Tadalafil) .... One by mouth daily 4)  Nexium 40 Mg Cpdr (Esomeprazole magnesium) .... One by mouth two times a day 5)  Singulair 10 Mg Tabs (Montelukast sodium) .Marland Kitchen.. 1 once daily 6)  Dulera 200-5 Mcg/act Aero (Mometasone furo-formoterol fum) .... Two puff two times a day 7)  Proair Hfa 108 (90 Base) Mcg/act Aers (Albuterol sulfate) .... 2 inhalations two times a day prn 8)  Albuterol Sulfate (2.5 Mg/49ml) 0.083% Nebu (Albuterol sulfate) .... To use a 8 hours as needed with nebulizer prn  Other Orders: Joint Aspirate / Injection, Large (20610) Depo- Medrol 40mg  (J1030)  Patient Instructions: 1)  Please schedule a follow-up appointment in 4 months.  Immunization History:  Influenza Immunization History:    Influenza:  historical (09/15/2009)  Appended Document: Orders Update    Clinical Lists Changes  Problems: Added new problem of CONTACT OR EXPOSURE TO OTHER VIRAL DISEASES (ICD-V01.79) Orders: Added new Service order of Venipuncture [phone]) - Signed Added new Test order of T-HIV Antibody  (Reflex) [phone]) - Signed      Appended Document: Orders Update    Clinical Lists Changes  Orders: Added new Service order of Specimen Handling ([id number]) - Signed

## 2010-12-15 NOTE — Medication Information (Signed)
Summary: Coverage Approval for Nexium Capsule/[employer]  Coverage Approval for Nexium Capsule/[employer]   Imported By: Maryln Gottron 12/01/2009 09:48:24  _____________________________________________________________________  External Attachment:    Type:   Image     Comment:   External Document

## 2010-12-15 NOTE — Assessment & Plan Note (Signed)
Summary: med check/per Bonnye/cjr   Vital Signs:  Patient profile:   60 year old male Height:      71 inches Weight:      164 pounds BMI:     22.96 Temp:     98.2 degrees F oral Pulse rate:   72 / minute Resp:     14 per minute BP sitting:   130 / 80  (left arm)  Vitals Entered By: Willy Eddy, LPN (December 22, 2009 11:16 AM) CC: c/o sore throat/earache, URI symptoms   CC:  c/o sore throat/earache and URI symptoms.  History of Present Illness:  URI Symptoms      This is a 60 year old man who presents with URI symptoms.  The patient reports nasal congestion, purulent nasal discharge, and dry cough, but denies clear nasal discharge, sore throat, productive cough, earache, and sick contacts.  The patient denies fever, low-grade fever (<100.5 degrees), fever of 100.5-103 degrees, fever of 103.1-104 degrees, fever to >104 degrees, stiff neck, dyspnea, wheezing, rash, vomiting, diarrhea, use of an antipyretic, and response to antipyretic.  The patient also reports itchy watery eyes and headache.  The patient denies the following risk factors for Strep sinusitis: unilateral facial pain, unilateral nasal discharge, poor response to decongestant, double sickening, tooth pain, Strep exposure, tender adenopathy, and absence of cough.    Preventive Screening-Counseling & Management  Alcohol-Tobacco     Smoking Status: quit  Problems Prior to Update: 1)  Pharyngitis, Streptococcal  (ICD-034.0) 2)  Acute Prostatitis  (ICD-601.0) 3)  Rash-nonvesicular  (ICD-782.1) 4)  Rash-nonvesicular  (ICD-782.1) 5)  Gonococcal Infection Lower Genitourinary Tract  (ICD-098.0) 6)  Neoplasm, Skin, Uncertain Behavior  (ICD-238.2) 7)  Cellulitis and Abscess of Leg Except Foot  (ICD-682.6) 8)  Rectal Fissure  (ICD-565.0) 9)  External Hemorrhoids With Other Complication  (ICD-455.5) 10)  Allergic Rhinitis  (ICD-477.9) 11)  Rash and Other Nonspecific Skin Eruption  (ICD-782.1) 12)  Abnormal Coagulation  Profile  (ICD-790.92) 13)  Contusion of Unspecified Site  (ICD-924.9) 14)  Physical Examination  (ICD-V70.0) 15)  Screening For Unspecified Malignant Neoplasm  (ICD-V76.9) 16)  Osteoarthrosis, Local Nos, Lower Leg  (ICD-715.36) 17)  Sexually Transmitted Disease, Exposure To  (ICD-V01.6) 18)  Pityriasis Rosea  (ICD-696.3) 19)  Gerd  (ICD-530.81) 20)  Asthma  (ICD-493.90)  Current Problems (verified): 1)  Pharyngitis, Streptococcal  (ICD-034.0) 2)  Acute Prostatitis  (ICD-601.0) 3)  Rash-nonvesicular  (ICD-782.1) 4)  Rash-nonvesicular  (ICD-782.1) 5)  Gonococcal Infection Lower Genitourinary Tract  (ICD-098.0) 6)  Neoplasm, Skin, Uncertain Behavior  (ICD-238.2) 7)  Cellulitis and Abscess of Leg Except Foot  (ICD-682.6) 8)  Rectal Fissure  (ICD-565.0) 9)  External Hemorrhoids With Other Complication  (ICD-455.5) 10)  Allergic Rhinitis  (ICD-477.9) 11)  Rash and Other Nonspecific Skin Eruption  (ICD-782.1) 12)  Abnormal Coagulation Profile  (ICD-790.92) 13)  Contusion of Unspecified Site  (ICD-924.9) 14)  Physical Examination  (ICD-V70.0) 15)  Screening For Unspecified Malignant Neoplasm  (ICD-V76.9) 16)  Osteoarthrosis, Local Nos, Lower Leg  (ICD-715.36) 17)  Sexually Transmitted Disease, Exposure To  (ICD-V01.6) 18)  Pityriasis Rosea  (ICD-696.3) 19)  Gerd  (ICD-530.81) 20)  Asthma  (ICD-493.90)  Medications Prior to Update: 1)  Valtrex 1 Gm  Tabs (Valacyclovir Hcl) .... Take 1 Tablet By Mouth Once A Day 2)  Veramyst 27.5 Mcg/spray  Susp (Fluticasone Furoate) .Marland Kitchen.. 1 Spray Each Nostril Two Times A Day 3)  Cialis 5 Mg Tabs (Tadalafil) .... One By Mouth  Daily 4)  Nexium 40 Mg Cpdr (Esomeprazole Magnesium) .... One By Mouth Two Times A Day 5)  Singulair 10 Mg Tabs (Montelukast Sodium) .Marland Kitchen.. 1 Once Daily 6)  Symbicort 160-4.5 Mcg/act Aero (Budesonide-Formoterol Fumarate) .... 2 Puffs Two Times A Day 7)  Proair Hfa 108 (90 Base) Mcg/act Aers (Albuterol Sulfate) .... 2 Inhalations Two  Times A Day Prn 8)  Zyrtec Hives Relief 10 Mg Tabs (Cetirizine Hcl) .Marland Kitchen.. 1 Once Daily 9)  Cephalexin 500 Mg Caps (Cephalexin) .... One By Mouth Three Times A Day For 10 Days  Current Medications (verified): 1)  Valtrex 1 Gm  Tabs (Valacyclovir Hcl) .... Take 1 Tablet By Mouth Once A Day 2)  Veramyst 27.5 Mcg/spray  Susp (Fluticasone Furoate) .Marland Kitchen.. 1 Spray Each Nostril Two Times A Day 3)  Cialis 5 Mg Tabs (Tadalafil) .... One By Mouth Daily 4)  Nexium 40 Mg Cpdr (Esomeprazole Magnesium) .... One By Mouth Two Times A Day 5)  Singulair 10 Mg Tabs (Montelukast Sodium) .Marland Kitchen.. 1 Once Daily 6)  Symbicort 160-4.5 Mcg/act Aero (Budesonide-Formoterol Fumarate) .... 2 Puffs Two Times A Day 7)  Proair Hfa 108 (90 Base) Mcg/act Aers (Albuterol Sulfate) .... 2 Inhalations Two Times A Day Prn 8)  Zyrtec Hives Relief 10 Mg Tabs (Cetirizine Hcl) .Marland Kitchen.. 1 Once Daily 9)  Azithromycin 250 Mg Tabs (Azithromycin) .... Two By Mouth Now Then One By Mouth Daily For 4 Days 10)  Allerx-D 120-2.5 Mg Xr12h-Tab (Pseudoephedrine-Methscopolamin) .... One By Mouth Two Times A Day  Allergies (verified): 1)  ! Pcn 2)  ! 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  Past Medical History: Reviewed history from 08/16/2008 and no changes required. Asthma Herpes GERD Allergic rhinitis  Past Surgical History: Reviewed history from 07/28/2007 and no changes required. arthroscopic surgery right knee bx  for granuloma  Family  History: Reviewed history from 28-Jul-2007 and no changes required. father died at 69 MVA mother  Family History of Arthritis Family History Hypertension  Social History: Reviewed history from 07/28/07 and no changes required. 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  The patient denies anorexia, fever, weight loss, weight gain, vision loss, decreased hearing, syncope, peripheral edema, headaches, hemoptysis, abdominal pain, melena, hematochezia, severe indigestion/heartburn, hematuria, incontinence, genital sores, muscle weakness, suspicious skin lesions, transient blindness, difficulty walking, depression, unusual weight change, abnormal bleeding, enlarged lymph nodes, angioedema, breast masses, and testicular masses.    Physical Exam  General:  Well-developed,well-nourished,in no acute distress; alert,appropriate and cooperative throughout examination Head:  normocephalic and atraumatic.   Eyes:  pupils equal and pupils round.   Ears:  R ear normal and L ear normal.   Nose:  External nasal examination shows no deformity or inflammation. Nasal mucosa are pink and moist without lesions or exudates. Mouth:  pharyngeal erythema, pharyngeal exudate, and posterior lymphoid hypertrophy.   Neck:  nuchal rigidity.  muscle spasm Lungs:  normal respiratory effort and no wheezes.  Heart:  normal rate and regular rhythm.   Abdomen:  Bowel sounds positive,abdomen soft and non-tender without masses, organomegaly or hernias noted.   Impression & Recommendations:  Problem # 1:  BRONCHITIS, OBSTRUCTIVE CHRONIC, ACUTE EXACERBATION (ICD-491.21) Assessment New call in rx for zithromax, medrol ad allerx.  Problem # 2:  ASTHMA (ICD-493.90) Assessment: Deteriorated  refill His updated medication list for this problem  includes:    Singulair 10 Mg Tabs (Montelukast sodium) .Marland Kitchen... 1 once daily    Symbicort 160-4.5 Mcg/act Aero (Budesonide-formoterol fumarate) .Marland Kitchen... 2 puffs two times a day    Proair Hfa 108 (90 Base) Mcg/act Aers (Albuterol sulfate) .Marland Kitchen... 2 inhalations two times a day prn  Orders: Admin of Therapeutic Inj  intramuscular or subcutaneous (04540) Depo- Medrol 80mg  (J1040)  Complete Medication List: 1)  Valtrex 1 Gm Tabs (Valacyclovir hcl) .... Take 1 tablet by mouth once a day 2)  Veramyst 27.5 Mcg/spray Susp (Fluticasone furoate) .Marland Kitchen.. 1 spray each nostril two times a day 3)  Cialis 5 Mg Tabs (Tadalafil) .... One by mouth daily 4)  Nexium 40 Mg Cpdr (Esomeprazole magnesium) .... One by mouth two times a day 5)  Singulair 10 Mg Tabs (Montelukast sodium) .Marland Kitchen.. 1 once daily 6)  Symbicort 160-4.5 Mcg/act Aero (Budesonide-formoterol fumarate) .... 2 puffs two times a day 7)  Proair Hfa 108 (90 Base) Mcg/act Aers (Albuterol sulfate) .... 2 inhalations two times a day prn 8)  Zyrtec Hives Relief 10 Mg Tabs (Cetirizine hcl) .Marland Kitchen.. 1 once daily 9)  Azithromycin 250 Mg Tabs (Azithromycin) .... Two by mouth now then one by mouth daily for 4 days 10)  Allerx-d 120-2.5 Mg Xr12h-tab (Pseudoephedrine-methscopolamin) .... One by mouth two times a day  Patient Instructions: 1)  voice rest for 24 hours Prescriptions: ALLERX-D 120-2.5 MG XR12H-TAB (PSEUDOEPHEDRINE-METHSCOPOLAMIN) one by mouth two times a day  #20 x 0   Entered and Authorized by:   Stacie Glaze MD   Signed by:   Stacie Glaze MD on 12/22/2009   Method used:   Electronically to        [facility] [address] #[id number]* (retail)       [address]       [place], [place]  [address]       Ph: [phone]       Fax: [phone]   RxID:   [id number] AZITHROMYCIN 250 MG TABS (AZITHROMYCIN) two by mouth now then one by mouth daily for 4 days  #6 x 0   Entered and Authorized by:   Stacie Glaze MD   Signed  by:   Stacie Glaze MD on 12/22/2009   Method used:   Electronically to        [facility] #[id number]* (retail)       [address]       [place], [place]  [address]       Ph: [phone]       Fax: [phone]   RxID:   [id number]

## 2010-12-15 NOTE — Assessment & Plan Note (Signed)
Summary: foot pain/njr   Vital Signs:  Patient profile:   60 year old male Temp:     98.8 degrees F oral BP sitting:   150 / 100  (left arm)  Vitals Entered By: Sid Falcon LPN (May 14, 2010 11:34 AM) CC: left foot pain   History of Present Illness: 6 week hx of L foot pain. Pain is in heel.  Pain is sore/achy quality. Worse first thing in AM and after periods of sitting. Moderate severity.  Arch supports with mild relief.  runs about 3 miles per day. Advil with minimal relief. Ice with mild relief. No signif achilles pain.  Allergies: 1)  ! Pcn 2)  ! Sulfa 3)  * Lamisil  Past History:  Past Medical History: Last updated: 08/16/2008 Asthma Herpes GERD Allergic rhinitis PMH reviewed for relevance  Review of Systems      See HPI  Physical Exam  General:  Well-developed,well-nourished,in no acute distress; alert,appropriate and cooperative throughout examination Pulses:  R dorsalis pedis normal and L dorsalis pedis normal.   Extremities:  tender over plantar fascia proximally.  No visible swelling or erythema.  No achilles tenderness. No bony tenderness in foot or ankle. Neurologic:  full strength with plantar and dorsi flexion.   Impression & Recommendations:  Problem # 1:  FASCIITIS, PLANTAR (ICD-728.71) Assessment New discussed treatment options-ice, stretches, Celebrex 200 mg by mouth once daily with samples given, Pennsaid.  Offered steroid injection and he wishes to wait at this time.  He is aware this can sometimes take months to resolve.  Complete Medication List: 1)  Valtrex 1 Gm Tabs (Valacyclovir hcl) .... Take 1 tablet by mouth once a day 2)  Veramyst 27.5 Mcg/spray Susp (Fluticasone furoate) .Marland Kitchen.. 1 spray each nostril two times a day 3)  Cialis 5 Mg Tabs (Tadalafil) .... One by mouth daily 4)  Nexium 40 Mg Cpdr (Esomeprazole magnesium) .... One by mouth two times a day 5)  Singulair 10 Mg Tabs (Montelukast sodium) .Marland Kitchen.. 1 once daily 6)  Dulera  200-5 Mcg/act Aero (Mometasone furo-formoterol fum) .... Two puff two times a day 7)  Proair Hfa 108 (90 Base) Mcg/act Aers (Albuterol sulfate) .... 2 inhalations two times a day prn 8)  Albuterol Sulfate (2.5 Mg/32ml) 0.083% Nebu (Albuterol sulfate) .... To use a 8 hours as needed with nebulizer prn 9)  Tussionex Pennkinetic Er 8-10 Mg/41ml Lqcr (Chlorpheniramine-hydrocodone) .... One tsp by mouth two times a day 10)  Prednisone 20 Mg Tabs (Prednisone) .... 3 by mouth for 3 days then 2 by mouth for 2 days and then 1 by mouth for 2 day and then 1/2 by mouth for 2 days 11)  Mucinex D 60-600 Mg Xr12h-tab (Pseudoephedrine-guaifenesin) .... One totwo by mouth two times a day 12)  Fluconazole 100 Mg Tabs (Fluconazole) .... One by mouth daily for 7 days post the prednisone 13)  Levaquin 750 Mg Tabs (Levofloxacin) .... One by mouth q day 14)  Hydrocod Polst-chlorphen Polst 10-8 Mg/15ml Lqcr (Chlorpheniramine-hydrocodone) .... Use as directed  Patient Instructions: 1)  You have plantar faciitis. 2)  Continue stretching several times daily. 3)  Ice 2-3 times daily. 4)  Celebrex 200mg   once daily 5)  Pennsaid 6 to 8 drops three times daily.

## 2010-12-15 NOTE — Miscellaneous (Signed)
Summary: Orders Update pft charges  Clinical Lists Changes  Orders: Added new Service order of Carbon Monoxide diffusing w/capacity (94720) - Signed Added new Service order of Lung Volumes (94240) - Signed Added new Service order of Spirometry (Pre & Post) (94060) - Signed 

## 2010-12-17 NOTE — Progress Notes (Signed)
Summary: Pt req work in ov with Dr. Lovell Sheehan Only. Will not see another dr  Phone Note Call from Patient Call back at Work Phone [phone]   Caller: Patient Summary of Call: Pt having asthma problems and is needing to have inhaler changed. Pt now has developed a rash. Pt refused to see another doctor. Wants work in appt with Dr. Lovell Sheehan only.  Initial call taken by: Lucy Antigua,  December 02, 2010 11:21 AM  Follow-up for Phone Call        ov given for tomorrow and pt informed Follow-up by: Willy Eddy, LPN,  December 02, 2010 2:25 PM

## 2010-12-17 NOTE — Assessment & Plan Note (Signed)
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  History of Present Illness:  Physician Signed  Progress Notes 12/03/2010 11:12 AM  Related encounter: Documentation from 12/03/2010 in [facility] [place]  Subjective:      Patient ID: Tyler [name] is a 60 y.o. male.   Asthma This is a recurrent problem. The current episode started more than 1 year ago. The problem occurs intermittently. The problem has been gradually improving. Associated symptoms include postnasal drip, rhinorrhea, a sore throat and trouble swallowing. Pertinent negatives include no appetite change, chest pain, dyspnea on exertion or ear congestion. Associated symptoms comments: Recurrent candidal infections in the deep throat from the use of steroid inhailers. Relieved by: diflucan fro the candidal infection. He reports significant improvement on treatment. Risk factors for lung disease include smoking/tobacco exposure. His past medical history is significant for asthma.    The following portions of the patient's history were reviewed and updated as appropriate: allergies, current medications, past family history, past medical history, past social history, past surgical history and problem list.   Review of Systems  Constitutional: Negative for appetite change.  HENT: Positive for sore throat, rhinorrhea, trouble swallowing and postnasal drip.   Eyes: Negative.   Respiratory: Negative.   Cardiovascular: Negative.   Negative for chest pain and dyspnea on exertion.  Neurological: Negative.   Psychiatric/Behavioral: Negative.        Objective:    Physical Exam  Constitutional: He is oriented to person, place, and time.  HENT:   Head: Normocephalic.  Right Ear: External ear normal.  Left Ear: External ear normal.   Mouth/Throat: Oropharynx is clear and moist.       Surgical changes from tonsilectomy  Eyes: Conjunctivae are normal. Pupils are equal, round, and reactive to light.  Neck: Normal range of motion. Neck supple.  Pulmonary/Chest: Breath sounds normal.  Abdominal: Bowel sounds are normal.  Musculoskeletal: Normal r   Preventive Screening-Counseling & Management  Alcohol-Tobacco     Smoking Status: quit  Current Problems (verified): 1)  Contact or Exposure To Other Viral Diseases  (ICD-V01.79) 2)  Fasciitis, Plantar  (ICD-728.71) 3)  Bronchitis, Obstructive Chronic, Acute Exacerbation  (ICD-491.21) 4)  Pharyngitis, Streptococcal  (ICD-034.0) 5)  Acute Prostatitis  (ICD-601.0) 6)  Rash-nonvesicular  (ICD-782.1) 7)  Rash-nonvesicular  (ICD-782.1) 8)  Gonococcal Infection Lower Genitourinary Tract  (ICD-098.0) 9)  Neoplasm, Skin, Uncertain Behavior  (ICD-238.2) 10)  Cellulitis and Abscess of Leg Except Foot  (ICD-682.6) 11)  Rectal Fissure  (ICD-565.0) 12)  External Hemorrhoids With Other Complication  (ICD-455.5) 13)  Allergic Rhinitis  (ICD-477.9) 14)  Rash and Other Nonspecific Skin Eruption  (ICD-782.1) 15)  Abnormal Coagulation Profile  (ICD-790.92) 16)  Contusion of Unspecified Site  (ICD-924.9) 17)  Physical Examination  (ICD-V70.0) 18)  Screening For Unspecified Malignant Neoplasm  (ICD-V76.9) 19)  Osteoarthrosis, Local Nos, 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 Take 1 Tablet By Mouth Once A Day 2)  Veramyst 27.5 Mcg/spray  Susp (Fluticasone Furoate) .Marland Kitchen.. 1 Spray Each Nostril Two Times A Day 3)  Cialis 5 Mg Tabs (Tadalafil) .... One By Mouth Daily 4)  Nexium 40 Mg Cpdr (Esomeprazole Magnesium) .... One By Mouth Two Times A Day 5)  Singulair 10 Mg Tabs (Montelukast Sodium) .Marland Kitchen.. 1 Once Daily 6)  Dulera 200-5 Mcg/act Aero (Mometasone Furo-Formoterol Fum) .... Two Puff Two Times A Day 7)  Proair Hfa 108 (90 Base) Mcg/act Aers (Albuterol Sulfate) .... 2 Inhalations Two Times A Day Prn 8)  Albuterol Sulfate (2.5 Mg/50ml) 0.083% Nebu (Albuterol Sulfate) .... To Use A 8 Hours As Needed With Nebulizer Prn 9)  Hydrocortisone Ace-Pramoxine 2.5-1 % Crea (Hydrocortisone Ace-Pramoxine) .... Apply Two Times A Day For 7 Days 10)  Diflucan 100 Mg Tabs (Fluconazole) .Marland Kitchen.. 1 Once Daily For 2 Weeks  Allergies (verified): 1)  ! Pcn 2)  ! Sulfa 3)  * Lamisil   Impression & Recommendations:  Problem # 1:  ASTHMA (ICD-493.90)  His updated medication list for this problem includes:    Singulair 10 Mg Tabs (Montelukast sodium) .Marland Kitchen... 1 once daily    Dulera 200-5 Mcg/act Aero (Mometasone furo-formoterol fum) .Marland Kitchen..Marland Kitchen Two puff two times a day    Proair Hfa 108 (90 Base) Mcg/act Aers (Albuterol sulfate) .Marland Kitchen... 2 inhalations two times a day prn    Albuterol Sulfate (2.5 Mg/90ml) 0.083% Nebu (Albuterol sulfate) .Marland Kitchen... To use a 8 hours as needed with nebulizer prn  Orders: Physician Signed  Progress Notes 12/03/2010 11:12 AM  Related encounter: Documentation from 12/03/2010 in [facility] [place]  Subjective:      Patient ID: Tyler [name] is a 60 y.o. male.   Asthma This is a recurrent problem. The current episode started more than 1 year ago. The problem occurs intermittently. The problem has been gradually improving. Associated symptoms include postnasal drip, rhinorrhea, a sore throat and trouble swallowing. Pertinent negatives include no appetite change,  chest pain, dyspnea on exertion or ear congestion. Associated symptoms comments: Recurrent candidal infections in the deep throat from the use of steroid inhailers. Relieved by: diflucan fro the candidal infection. He reports significant improvement on treatment. Risk factors for lung disease include smoking/tobacco exposure. His past medical history is significant for asthma.    The following portions of the patient's history were reviewed and updated as appropriate: allergies, current medications, past family history, past medical history, past social history, past surgical history and problem list.   Review of Systems  Constitutional: Negative for appetite change.  HENT: Positive for sore throat, rhinorrhea, trouble swallowing and postnasal drip.   Eyes: Negative.   Respiratory: Negative.   Cardiovascular: Negative.  Negative for chest pain and dyspnea on exertion.  Neurological: Negative.   Psychiatric/Behavioral: Negative.        Objective:    Physical Exam  Const Spirometry w/Graph (94010)  Complete Medication List: 1)  Valtrex 1 Gm Tabs (Valacyclovir hcl) .... Take 1 tablet by mouth once a day 2)  Veramyst 27.5 Mcg/spray Susp (Fluticasone furoate) .Marland Kitchen.. 1 spray each nostril two times a day 3)  Cialis 5 Mg Tabs (Tadalafil) .... One by mouth daily 4)  Nexium 40 Mg Cpdr (Esomeprazole magnesium) .... One by mouth two times a day 5)  Singulair 10 Mg Tabs (Montelukast sodium) .Marland Kitchen.. 1 once daily 6)  Dulera 200-5 Mcg/act Aero (Mometasone furo-formoterol fum) .... Two puff two times a day 7)  Proair Hfa 108 (90 Base) Mcg/act Aers (Albuterol sulfate) .... 2 inhalations two times a day prn 8)  Albuterol Sulfate (2.5 Mg/36ml) 0.083% Nebu (Albuterol sulfate) .... To use a 8 hours as needed with nebulizer prn 9)  Hydrocortisone Ace-pramoxine 2.5-1 % Crea (Hydrocortisone ace-pramoxine) .... Apply two times a day for 7 days 10)  Diflucan 100 Mg Tabs (Fluconazole) .Marland Kitchen.. 1 once daily for 2  weeks  Patient Instructions: 1)  Please schedule a follow-up appointment in 2 months.   Orders Added: 1)  Spirometry w/Graph [94010] 2)  Est. Patient Level IV [16109]

## 2011-01-04 ENCOUNTER — Other Ambulatory Visit: Payer: Self-pay | Admitting: Internal Medicine

## 2011-01-27 ENCOUNTER — Ambulatory Visit (INDEPENDENT_AMBULATORY_CARE_PROVIDER_SITE_OTHER): Payer: BC Managed Care – PPO | Admitting: Internal Medicine

## 2011-01-31 ENCOUNTER — Other Ambulatory Visit: Payer: Self-pay | Admitting: Internal Medicine

## 2011-02-11 ENCOUNTER — Ambulatory Visit (INDEPENDENT_AMBULATORY_CARE_PROVIDER_SITE_OTHER): Payer: BC Managed Care – PPO | Admitting: Internal Medicine

## 2011-02-11 DIAGNOSIS — J45909 Unspecified asthma, uncomplicated: Secondary | ICD-10-CM

## 2011-02-11 LAB — PULMONARY FUNCTION TEST

## 2011-02-11 NOTE — Progress Notes (Signed)
PFT done today. 

## 2011-03-04 ENCOUNTER — Encounter: Payer: Self-pay | Admitting: Internal Medicine

## 2011-03-04 ENCOUNTER — Ambulatory Visit (INDEPENDENT_AMBULATORY_CARE_PROVIDER_SITE_OTHER): Payer: BC Managed Care – PPO | Admitting: Internal Medicine

## 2011-03-04 VITALS — BP 130/80 | HR 72 | Temp 98.2°F | Resp 14 | Ht 71.0 in | Wt 163.0 lb

## 2011-03-04 DIAGNOSIS — K6289 Other specified diseases of anus and rectum: Secondary | ICD-10-CM

## 2011-03-04 DIAGNOSIS — R197 Diarrhea, unspecified: Secondary | ICD-10-CM

## 2011-03-04 DIAGNOSIS — N51 Disorders of male genital organs in diseases classified elsewhere: Secondary | ICD-10-CM

## 2011-03-04 MED ORDER — METRONIDAZOLE 250 MG PO TABS: 250.0000 mg | ORAL_TABLET | Freq: Three times a day (TID) | ORAL | Status: AC

## 2011-03-04 MED ORDER — CIPROFLOXACIN HCL 500 MG PO TABS: 500.0000 mg | ORAL_TABLET | Freq: Two times a day (BID) | ORAL | Status: AC

## 2011-03-04 MED ORDER — FLUCONAZOLE 100 MG PO TABS: 100.0000 mg | ORAL_TABLET | Freq: Every day | ORAL | Status: AC

## 2011-03-04 NOTE — Progress Notes (Signed)
  Subjective:    Patient ID: Tyler [name], male    DOB: [birth year], 60 y.o.   MRN: [id number]  HPI rectal pain with rash Noted brown  Discoloration  in ejaculate No pain with urination or discharge. Has noted diarrhea    Review of Systems  Constitutional: Negative for fever and fatigue.  HENT: Negative for hearing loss, congestion, neck pain and postnasal drip.   Eyes: Negative for discharge, redness and visual disturbance.  Respiratory: Negative for cough, shortness of breath and wheezing.   Cardiovascular: Negative for leg swelling.  Gastrointestinal: Negative for abdominal pain, constipation and abdominal distention.  Genitourinary: Negative for urgency and frequency.  Musculoskeletal: Negative for joint swelling and arthralgias.  Skin: Negative for color change and rash.  Neurological: Negative for weakness and light-headedness.  Hematological: Negative for adenopathy.  Psychiatric/Behavioral: Negative for behavioral problems.       Past Medical History  Diagnosis Date  . External hemorrhoids with other complication 09/18/2008  . ASTHMA 05/11/2007  . GERD 06/28/2007  . Pityriasis rosea 06/28/2007  . FASCIITIS, PLANTAR 05/14/2010  . Thrombocytopenia 12/03/2010   Past Surgical History  Procedure Date  . Knee arthroscopy w/ debridement 2006    surgeon Allusio  . Tonsillectomy     at age 46    reports that he quit smoking about 22 years ago. His smoking use included Cigarettes. He quit after 15 years of use. He does not have any smokeless tobacco history on file. He reports that he does not drink alcohol or use illicit drugs. family history includes Drug abuse in his sister. Allergies  Allergen Reactions  . Penicillins     REACTION: High fever  . Sulfonamide Derivatives     REACTION: joints ache    Objective:   Physical Exam  Constitutional: He appears well-developed and well-nourished.  HENT:  Head: Normocephalic and atraumatic.  Eyes: Conjunctivae are normal.  Pupils are equal, round, and reactive to light.  Neck: Normal range of motion. Neck supple.  Cardiovascular: Normal rate and regular rhythm.   Pulmonary/Chest: Effort normal and breath sounds normal.  Abdominal: Soft. Bowel sounds are normal.  Genitourinary:       The perianal tissues inflamed and red there are no obvious lesions. He states he has seen some point discharge the tissues painful rather than periodic does have a boggy prostate and he has had diarrhea I would treat him with Cipro and Flagyl          Assessment & Plan:  Will treat with Cipro and Flagyl for a 10 day course as well as Diflucan believed that this is a mixed problem but the diarrhea probably has contributed to the anal pain there is mildperianal cellulitis and marked prostatitis this combination should treat the etiology of these inflammations

## 2011-03-06 ENCOUNTER — Other Ambulatory Visit: Payer: Self-pay | Admitting: Internal Medicine

## 2011-03-08 ENCOUNTER — Other Ambulatory Visit: Payer: Self-pay | Admitting: Internal Medicine

## 2011-03-16 HISTORY — PX: APPENDECTOMY: SHX54

## 2011-03-18 ENCOUNTER — Other Ambulatory Visit: Payer: Self-pay | Admitting: Internal Medicine

## 2011-03-19 ENCOUNTER — Other Ambulatory Visit: Payer: Self-pay | Admitting: *Deleted

## 2011-03-22 ENCOUNTER — Encounter: Payer: Self-pay | Admitting: Internal Medicine

## 2011-03-22 ENCOUNTER — Other Ambulatory Visit: Payer: Self-pay | Admitting: General Surgery

## 2011-03-22 ENCOUNTER — Inpatient Hospital Stay (HOSPITAL_COMMUNITY)
Admission: EM | Admit: 2011-03-22 | Discharge: 2011-03-28 | DRG: 883 | Disposition: A | Discharge status: Home / Self Care | Payer: BC Managed Care – PPO | Attending: Surgery | Admitting: Surgery

## 2011-03-22 ENCOUNTER — Ambulatory Visit (INDEPENDENT_AMBULATORY_CARE_PROVIDER_SITE_OTHER): Payer: BC Managed Care – PPO | Admitting: Internal Medicine

## 2011-03-22 ENCOUNTER — Emergency Department (HOSPITAL_COMMUNITY): Payer: BC Managed Care – PPO

## 2011-03-22 ENCOUNTER — Encounter (HOSPITAL_COMMUNITY): Payer: Self-pay | Admitting: Radiology

## 2011-03-22 DIAGNOSIS — Y9210 Unspecified residential institution as the place of occurrence of the external cause: Secondary | ICD-10-CM | POA: Diagnosis not present

## 2011-03-22 DIAGNOSIS — K929 Disease of digestive system, unspecified: Secondary | ICD-10-CM | POA: Diagnosis not present

## 2011-03-22 DIAGNOSIS — R1031 Right lower quadrant pain: Secondary | ICD-10-CM

## 2011-03-22 DIAGNOSIS — Z880 Allergy status to penicillin: Secondary | ICD-10-CM

## 2011-03-22 DIAGNOSIS — N529 Male erectile dysfunction, unspecified: Secondary | ICD-10-CM | POA: Diagnosis present

## 2011-03-22 DIAGNOSIS — Z79899 Other long term (current) drug therapy: Secondary | ICD-10-CM

## 2011-03-22 DIAGNOSIS — K219 Gastro-esophageal reflux disease without esophagitis: Secondary | ICD-10-CM | POA: Diagnosis present

## 2011-03-22 DIAGNOSIS — J45909 Unspecified asthma, uncomplicated: Secondary | ICD-10-CM | POA: Diagnosis present

## 2011-03-22 DIAGNOSIS — K560 Paralytic ileus: Secondary | ICD-10-CM | POA: Diagnosis not present

## 2011-03-22 DIAGNOSIS — K3580 Unspecified acute appendicitis: Principal | ICD-10-CM | POA: Diagnosis present

## 2011-03-22 DIAGNOSIS — I10 Essential (primary) hypertension: Secondary | ICD-10-CM | POA: Diagnosis present

## 2011-03-22 DIAGNOSIS — Z882 Allergy status to sulfonamides status: Secondary | ICD-10-CM

## 2011-03-22 DIAGNOSIS — Y836 Removal of other organ (partial) (total) as the cause of abnormal reaction of the patient, or of later complication, without mention of misadventure at the time of the procedure: Secondary | ICD-10-CM | POA: Diagnosis not present

## 2011-03-22 HISTORY — PX: LAPAROSCOPIC APPENDECTOMY: SHX408

## 2011-03-22 LAB — CBC
HCT: 41.5 % (ref 39.0–52.0)
MCH: 34.4 pg — ABNORMAL HIGH (ref 26.0–34.0)
MCHC: 36.1 g/dL — ABNORMAL HIGH (ref 30.0–36.0)
MCV: 95.2 fL (ref 78.0–100.0)
RBC: 4.36 MIL/uL (ref 4.22–5.81)
RDW: 12.4 % (ref 11.5–15.5)
WBC: 15.1 10*3/uL — ABNORMAL HIGH (ref 4.0–10.5)

## 2011-03-22 LAB — DIFFERENTIAL
Basophils Absolute: 0 10*3/uL (ref 0.0–0.1)
Eosinophils Absolute: 0 10*3/uL (ref 0.0–0.7)
Eosinophils Relative: 0 % (ref 0–5)
Lymphocytes Relative: 6 % — ABNORMAL LOW (ref 12–46)
Monocytes Absolute: 0.9 10*3/uL (ref 0.1–1.0)
Monocytes Relative: 6 % (ref 3–12)
Neutro Abs: 13.3 10*3/uL — ABNORMAL HIGH (ref 1.7–7.7)
Neutrophils Relative %: 88 % — ABNORMAL HIGH (ref 43–77)

## 2011-03-22 LAB — BASIC METABOLIC PANEL
BUN: 9 mg/dL (ref 6–23)
CO2: 24 mEq/L (ref 19–32)
Calcium: 9.6 mg/dL (ref 8.4–10.5)
Creatinine, Ser: 0.76 mg/dL (ref 0.4–1.5)
GFR calc non Af Amer: >60 mL/min (ref >60)
Glucose, Bld: 97 mg/dL (ref 70–99)
Sodium: 134 mEq/L — ABNORMAL LOW (ref 135–145)

## 2011-03-22 MED ORDER — IOHEXOL 300 MG/ML  SOLN
80.0000 mL | Freq: Once | INTRAMUSCULAR | Status: AC | PRN
  Administered 2011-03-22: 80 mL via INTRAVENOUS

## 2011-03-22 NOTE — Assessment & Plan Note (Signed)
Significant clinical concern for possible acute appendicitis. Recommend immediate evaluation at the emergency department. States understanding and agreement. Patient's significant other currently in process of transferring patient to [facility] ED. ED charge nurse contacted and informed of patient's impending arrival.

## 2011-03-22 NOTE — Progress Notes (Signed)
  Subjective:    Patient ID: Tyler [name], male    DOB: [birth year], 60 y.o.   MRN: [id number]  HPI Pt presents to clinic for evaluation of severe abdominal pain. Notes was in usual state of health until last night when he developed vague periumbilical abdominal discomfort. Initially wondered if this was gas related. Slowly over the night the pain worsened and became centered over the right lower quadrant. As noted exquisite tenderness. Last night had subjective fever and chills not quantified as well as nausea without emesis. Cannot find a comfortable position to sit or stand in. Has had multiple soft stools but no diarrhea and no evidence of blood in stool. No alleviating or exacerbating factors. Blood pressure elevated but currently in pain. No other complaints  Reviewed past medical history, medications and allergies.  Review of Systems  Constitutional: Positive for fever and chills.  Respiratory: Negative for cough and shortness of breath.   Gastrointestinal: Positive for nausea and abdominal pain. Negative for vomiting, diarrhea, constipation and blood in stool.  Musculoskeletal: Negative for back pain and gait problem.  Skin: Negative for color change and rash.       Objective:   Physical Exam  Nursing note and vitals reviewed. Constitutional: He appears well-developed and well-nourished. He appears distressed.  HENT:  Head: Normocephalic and atraumatic.  Right Ear: External ear normal.  Left Ear: External ear normal.  Nose: Nose normal.  Eyes: Conjunctivae are normal. No scleral icterus.  Abdominal: Soft. Normal appearance and bowel sounds are normal. He exhibits no distension and no mass. There is tenderness in the right lower quadrant. There is rebound, guarding and tenderness at McBurney's point. There is no rigidity.  Neurological: He is alert.  Skin: Skin is warm and dry. No rash noted. He is not diaphoretic. No erythema.  Psychiatric: He has a normal mood and affect.           Assessment & Plan:

## 2011-03-26 LAB — CBC
HCT: 37.2 % — ABNORMAL LOW (ref 39.0–52.0)
Hemoglobin: 13.1 g/dL (ref 13.0–17.0)
MCH: 33.5 pg (ref 26.0–34.0)
MCHC: 35.2 g/dL (ref 30.0–36.0)
MCV: 95.1 fL (ref 78.0–100.0)
RBC: 3.91 MIL/uL — ABNORMAL LOW (ref 4.22–5.81)
RDW: 12.7 % (ref 11.5–15.5)
WBC: 5.9 10*3/uL (ref 4.0–10.5)

## 2011-03-26 LAB — BASIC METABOLIC PANEL
CO2: 30 mEq/L (ref 19–32)
Calcium: 9.6 mg/dL (ref 8.4–10.5)
Creatinine, Ser: 0.79 mg/dL (ref 0.4–1.5)
GFR calc non Af Amer: >60 mL/min (ref >60)
Glucose, Bld: 96 mg/dL (ref 70–99)
Sodium: 141 mEq/L (ref 135–145)

## 2011-03-30 ENCOUNTER — Encounter: Payer: Self-pay | Admitting: Internal Medicine

## 2011-03-30 ENCOUNTER — Ambulatory Visit (INDEPENDENT_AMBULATORY_CARE_PROVIDER_SITE_OTHER): Payer: BC Managed Care – PPO | Admitting: Internal Medicine

## 2011-03-30 VITALS — BP 150/90 | HR 76 | Temp 98.8°F | Resp 16 | Ht 71.0 in | Wt 164.0 lb

## 2011-03-30 DIAGNOSIS — T8140XA Infection following a procedure, unspecified, initial encounter: Secondary | ICD-10-CM

## 2011-03-30 DIAGNOSIS — T8149XA Infection following a procedure, other surgical site, initial encounter: Secondary | ICD-10-CM

## 2011-03-30 LAB — CBC WITH DIFFERENTIAL/PLATELET
Basophils Relative: 0.3 % (ref 0.0–3.0)
Eosinophils Relative: 0.8 % (ref 0.0–5.0)
Lymphocytes Relative: 21 % (ref 12.0–46.0)
MCV: 99.5 fl (ref 78.0–100.0)
Monocytes Absolute: 0.7 10*3/uL (ref 0.1–1.0)
Monocytes Relative: 5.9 % (ref 3.0–12.0)
Neutrophils Relative %: 72 % (ref 43.0–77.0)
Platelets: 262 10*3/uL (ref 150.0–400.0)
RBC: 4.26 Mil/uL (ref 4.22–5.81)
WBC: 11.1 10*3/uL — ABNORMAL HIGH (ref 4.5–10.5)

## 2011-03-30 LAB — BASIC METABOLIC PANEL
BUN: 17 mg/dL (ref 6–23)
Calcium: 9.1 mg/dL (ref 8.4–10.5)
Creatinine, Ser: 1 mg/dL (ref 0.4–1.5)
GFR: 86.51 mL/min (ref >60.00)

## 2011-03-30 MED ORDER — DOXYCYCLINE HYCLATE 100 MG PO TABS: 100.0000 mg | ORAL_TABLET | Freq: Two times a day (BID) | ORAL | Status: DC

## 2011-03-30 NOTE — Patient Instructions (Addendum)
Monitor your blood pressure twice daily and record the readings before next visit.  Take one half of a hydrocodone twice a day to try to achieve better pain control

## 2011-03-30 NOTE — Op Note (Signed)
NAMEJAMARIAN, Tyler [name]             ACCOUNT NO.:  [id number]  MEDICAL RECORD NO.:  [id number]           PATIENT TYPE:  I  LOCATION:  5157                         FACILITY:  [facility]  PHYSICIAN:  Ollen Gross. Vernell Morgans, M.D. DATE OF BIRTH:  1951/04/26  DATE OF PROCEDURE:  03/22/2011 DATE OF DISCHARGE:                              OPERATIVE REPORT   PREOPERATIVE DIAGNOSIS:  Acute appendicitis.  POSTOPERATIVE DIAGNOSIS:  Acute appendicitis.  PROCEDURE:  Laparoscopic appendectomy.  SURGEON:  Ollen Gross. Vernell Morgans, MD  ANESTHESIA:  General endotracheal.  PROCEDURE:  After informed consent was obtained, the patient was brought to the operating room and placed in a supine position on the operating room table.  After adequate induction of general anesthesia, the patient's abdomen was prepped with ChloraPrep and allowed to dry and draped in usual sterile manner.  The area below the umbilicus was infiltrated with 0.25% Marcaine.  A small incision was made with a 15blade knife.  This incision was carried down through the subcutaneous tissue bluntly with a hemostat and [employer] retractors until the linea alba was identified.  The linea alba was incised with a 15 blade knife and each side was grasped with Kocher clamps and elevated anteriorly. The preperitoneal space then probed bluntly with a hemostat until the peritoneum was opened and access was gained to the abdominal cavity.  A 0 Vicryl pursestring stitch was placed in the fascia around the opening. A Hasson cannula was placed through the opening and anchored in place to the previously placed Vicryl pursestring stitch.  The abdomen was then insufflated with carbon dioxide without difficulty.  The patient was placed in Trendelenburg position and rotated with the right side up.  A laparoscope was inserted through the Hasson cannula and the right lower quadrant was inspected.  Next, the suprapubic area was infiltrated with 0.25% Marcaine.  A small  incision was made with a 15 blade knife and a 10-mm port was placed bluntly through this incision into the abdominal cavity under direct vision.  Site was then chosen between the two on the left lower quadrant with placement of a 5-mm port.  This area was infiltrated with 0.25% Marcaine.  A small stab incision was made with a 15 blade knife and a 5-mm port was placed bluntly through this incision into the abdominal cavity under direct vision.  The laparoscope was then moved to the suprapubic port using a Glassman grasper and Harmonic scalpel.  We were able to inspect the right lower quadrant.  We were able to identify the appendix and bluntly separate it from the small bowel.  The tip of the appendix was gangrenous and had some contained perforation there.  We were able to elevate the appendix.  We took down the mesoappendix sharply with the Harmonic scalpel until the base of the appendix where it joined with the cecum was cleared of any tissue and we then placed a laparoscopic GIA 45 mm blue load six row stapler across the base of the appendix where it joined the cecum, clamped and fired it thereby dividing the base of the appendix between staple lines.  A laparoscopic bag  was then inserted through the Hasson cannula.  The appendix placed in the bag and the bag was sealed.  The abdomen was then irrigated with copious amounts of saline until the effluent was clear. The staple line was examined again and looked healthy and intact.  The appendix and the bag was removed with the Hasson cannula through the infraumbilical port without difficulty.  The fascial defect was closed with previously placed Vicryl pursestring stitch as well as with another figure-of-eight 0 Vicryl stitch.  The rest of the ports were removed under direct vision and were found to be hemostatic.  The gas was allowed to escape.  The skin incisions were all closed with interrupted 4-0 Monocryl subcuticular stitches  after irrigating each incision copiously. Dermabond dressings were then applied.  The patient tolerated the procedure well.  At the end of the case all needle, sponge and instrument counts were correct.  The patient was then awakened, taken to recovery room in stable condition.     Ollen Gross. Vernell Morgans, M.D.     PST/MEDQ  D:  03/23/2011  T:  03/23/2011  Job:  [id number]  Electronically Signed by Chevis Pretty III M.D. on 03/30/2011 09:35:48 AM

## 2011-03-30 NOTE — H&P (Signed)
  NAMEAASIR, DAIGLER             ACCOUNT NO.:  [id number]  MEDICAL RECORD NO.:  [id number]           PATIENT TYPE:  I  LOCATION:  5157                         FACILITY:  [facility]  PHYSICIAN:  Ollen Gross. Vernell Morgans, M.D. DATE OF BIRTH:  05/30/1951  DATE OF ADMISSION:  03/22/2011 DATE OF DISCHARGE:                             HISTORY & PHYSICAL   Mr. Albarran is a 60 year old white male who presents to the emergency department today with right lower quadrant pain that started about 1 a.m.  The pain has steadily worsened throughout the day.  Pain has been associated with nausea but no vomiting.  He has had some fevers or chills.  No diarrhea or dysuria.  His other review of systems are unremarkable.  PAST MEDICAL HISTORY:  Significant for asthma, arthritis, gastroesophageal reflux.  PAST SURGICAL HISTORY:  Significant for right knee surgery and tonsillectomy as a child.  Medications include albuterol, Cialis, meloxicam, Dulera, Nexium, Singulair, Veramyst, Diflucan, Valtrex.  Allergies are PENICILLIN, SULFA.  SOCIAL HISTORY:  He denies use of tobacco or alcohol products.  He is __________.  FAMILY HISTORY:  Noncontributory.  PHYSICAL EXAMINATION:  VITAL SIGNS:  His temperature is 103.4, pulse 106, blood pressure 172/107. GENERAL:  He is a well-developed, well-nourished white male in no acute distress.  He does feel fevered right now. SKIN:  Warm and dry.  No jaundice. EYES:  Extraocular muscles are intact.  Pupils are equal and reactive to light.  Sclerae nonicteric. LUNGS:  Clear bilaterally with no use of accessory respiratory muscles. HEART:  Regular rate and rhythm with impulse in left chest. ABDOMEN:  Soft but focally very tender in the right lower quadrant with guarding.  There is generalized peritonitis. EXTREMITIES:  There are no palpable mass or hepatosplenomegaly. EXTREMITIES:  No cyanosis, clubbing, or edema with good strength in arms and legs. PSYCHOLOGIC:  He is  alert and oriented x3 with no evidence of anxiety or depression.  Review of his lab work is significant for white count of 15,000.  The CT scan was reviewed with the radiologist and was significant for an enlarged inflamed appendix.  No abscess.  ASSESSMENT AND PLAN:  This is a 60 year old white male with what appears to be acute appendicitis.  Because of the risk of perforation and sepsis, I think he would benefit from having his appendix removed.  He would also like to have this done.  I have explained to him in detail the risks and benefits of the operation to remove the appendix as well as some of the technical aspects.  He understands and wishes to proceed.  We will obtain some routine preoperative lab work in preparation doing this one tonight.     Ollen Gross. Vernell Morgans, M.D.     PST/MEDQ  D:  03/22/2011  T:  03/23/2011  Job:  [id number]  Electronically Signed by Chevis Pretty III M.D. on 03/30/2011 09:35:45 AM

## 2011-03-30 NOTE — Progress Notes (Signed)
  Subjective:    Patient ID: Tyler [name], male    DOB: [birth year], 60 y.o.   MRN: [id number]  HPI The patient had appendicitis was treated to the emergency room and had emergency surgery. He had postsurgical complications with wound infection and abdominal pain.  His blood pressure was noted to rise to 180  during the hospitalization and he was placed on additional hypertensive medications. He has no history of treated hypertension in the past. He was discharged on amlodipine 5mg  and metoprolol 25 mg twice a day. His blood pressure remains moderately elevated today in stage I hypertension He was placed on cipro for a wound infection   Review of Systems  Constitutional: Negative.   HENT: Negative.   Eyes: Negative.   Respiratory: Negative.   Cardiovascular: Negative.   Gastrointestinal: Positive for nausea, abdominal pain and abdominal distention. Negative for constipation.  Genitourinary: Negative.   Musculoskeletal: Negative.   Skin: Negative.    Past Medical History  Diagnosis Date  . External hemorrhoids with other complication 09/18/2008  . ASTHMA 05/11/2007  . GERD 06/28/2007  . Pityriasis rosea 06/28/2007  . FASCIITIS, PLANTAR 05/14/2010  . Thrombocytopenia 12/03/2010   Past Surgical History  Procedure Date  . Knee arthroscopy w/ debridement 2006    surgeon Allusio  . Tonsillectomy     at age 42    reports that he quit smoking about 22 years ago. His smoking use included Cigarettes. He quit after 15 years of use. He does not have any smokeless tobacco history on file. He reports that he does not drink alcohol or use illicit drugs. family history includes Drug abuse in his sister. Allergies  Allergen Reactions  . Penicillins     REACTION: High fever  . Sulfonamide Derivatives     REACTION: joints ache       Objective:   Physical Exam Blood pressure 150/90, pulse 76, temperature 98.8 F (37.1 C), resp. rate 16, height 5\' 11"  (1.803 m), weight 164 lb (74.39  kg). HEENT pupils equal round and to light and accommodation neck supple Lymph nodes negative Lungs fields were clear to auscultation and percussion heart examination revealed a regular rate and rhythm without murmur gallop or rub abdomen is moderately distended 3 surgical wounds present the intra-umbilical incision was draining serosanguineous fluid and was extremely tender to palpation        Assessment & Plan:  1. Postsurgical wound infection the patient had an infected appendix removed in the surgical site developed a possible abscess with drainage and fistula formation is possible.  He is under the care of general surgeon and they have placed him on Cipro 500 twice a day we will add to that regimen doxycycline 100 by mouth twice a day to cover more gram-positive. And to have potential synergy for MRSA.  For  pain control I am recommending that he take nucenta. 50 mg by mouth daily it is unclear how much of the elevation in blood pressure is due to pain and anxiety and how much is true essential hypertension. I believe that he does have underlying labile hypertension that to stress and anxiety and pain of the surgery has brought to the forefront.  I am recommending that he get a blood pressure cuff and monitor his blood pressure home with at least 2 readings a day to bring back her off this

## 2011-04-02 NOTE — Op Note (Signed)
NAMETIGER, SPIEKER             ACCOUNT NO.:  [id number]   MEDICAL RECORD NO.:  [id number]          PATIENT TYPE:  AMB   LOCATION:  [facility]                         FACILITY:  [facility]   PHYSICIAN:  Ollen Gross, M.D.    DATE OF BIRTH:  11-20-1950   DATE OF PROCEDURE:  10/26/2005  DATE OF DISCHARGE:                                 OPERATIVE REPORT   PREOPERATIVE DIAGNOSIS:  Right knee bucket handle medial meniscal tear.   POSTOPERATIVE DIAGNOSIS:  Right knee bucket handle medial meniscal tear.   PROCEDURE:  Right knee arthroscopy with meniscal debridement.   ASSISTANT:  None.   ANESTHESIA:  General.   ESTIMATED BLOOD LOSS:  Minimal.   DRAINS:  None.   COMPLICATIONS:  None.   CONDITION:  Stable to recovery room.   CLINICAL NOTE:  Tyler [name] is a 60 year old male who injured his right knee a  couple of months ago.  He has had significant medial-sided pain and  mechanical locking symptoms.  Exam and history suggest a meniscal tear,  confirmed by MRI.  He presents now for arthroscopy and debridement.   PROCEDURE IN DETAIL:  After successful administration of general anesthetic,  a tourniquet is placed high on the right thigh and the right lower extremity  prepped and draped in the usual sterile fashion.  Standard superomedial and  inferolateral incisions were made and inflow cannula passed superomedial and  the camera passed inferolateral.  Arthroscopic visualization proceeds.  The  undersurface of the patella and trochlea looks normal.  The medial and  lateral gutters show slight synovitis, otherwise look normal.  Flexion and  valgus force is applied to the knee and the medial compartment is entered.  He has evidence of a significant tear in the body and posterior horn of the  medial meniscus.  A small incision is made.  A dilator is placed and a probe  is placed.  This is a bucket handle tear and it is very unstable as it is  easily subluxed into the joint.  We subsequently  debrided the tear back to a  stable base with baskets and a 4.2 mm shaver, removing all of the torn  fragments.  I probed and it was found to be stable.  The ArthroCare device  is used to seal the edges of the remnant of the meniscus.  The bleeding was  stopped with the ArthroCare.  The rest of the medial compartment looks fine.  The intercondylar notch is visualized and the ACL appears normal.  The  lateral compartment is entered and it is normal.  The arthroscopic equipment  is removed from  the inferior portals, which are closed with interrupted 4-0 nylon.  Twenty  mL of 0.25% Marcaine with epinephrine are injected through the inflow  cannula and that is removed and that portal closed with nylon.  A bulky  sterile dressing is applied and he is awakened and transported to the  recovery room in stable condition.      Ollen Gross, M.D.  Electronically Signed     FA/MEDQ  D:  10/26/2005  T:  10/27/2005  Job:  [id number] 

## 2011-04-13 ENCOUNTER — Ambulatory Visit (INDEPENDENT_AMBULATORY_CARE_PROVIDER_SITE_OTHER): Payer: BC Managed Care – PPO | Admitting: Internal Medicine

## 2011-04-13 ENCOUNTER — Encounter: Payer: Self-pay | Admitting: Internal Medicine

## 2011-04-13 DIAGNOSIS — I10 Essential (primary) hypertension: Secondary | ICD-10-CM

## 2011-04-13 DIAGNOSIS — J45909 Unspecified asthma, uncomplicated: Secondary | ICD-10-CM

## 2011-04-13 NOTE — Progress Notes (Signed)
  Subjective:    Patient ID: Tyler [name], male    DOB: 02-27-1951, 60 y.o.   MRN: [id number]  HPI Patient presents for followup of hypertension.  He is on Lopressor 25 twice daily and Norvasc 5 mg by mouth daily his blood pressures well controlled his home readings are excellent in the 110-120 range his blood pressure in the doctor's office is slightly elevated from those he came some "white coat syndrome " Patient's comorbid findings are esophageal reflux asthma he has noticed an increased amount of phlegm but he has not been singing or clearing his throat to the deep breathing.  He had complication of a wound infection and fistula formation following his surgery which has largely improved with packing he has an appointment with the surgeon today   Review of Systems  Constitutional: Negative for fever and fatigue.  HENT: Negative for hearing loss, congestion, neck pain and postnasal drip.   Eyes: Negative for discharge, redness and visual disturbance.  Respiratory: Negative for cough, shortness of breath and wheezing.   Cardiovascular: Negative for leg swelling.  Gastrointestinal: Negative for abdominal pain, constipation and abdominal distention.  Genitourinary: Negative for urgency and frequency.  Musculoskeletal: Negative for joint swelling and arthralgias.  Skin: Negative for color change and rash.  Neurological: Negative for weakness and light-headedness.  Hematological: Negative for adenopathy.  Psychiatric/Behavioral: Negative for behavioral problems.   Past Medical History  Diagnosis Date  . External hemorrhoids with other complication 09/18/2008  . ASTHMA 05/11/2007  . GERD 06/28/2007  . Pityriasis rosea 06/28/2007  . FASCIITIS, PLANTAR 05/14/2010  . Thrombocytopenia 12/03/2010   Past Surgical History  Procedure Date  . Knee arthroscopy w/ debridement 2006    surgeon Allusio  . Tonsillectomy     at age 74  . Cholecystectomy 03/20/2011    reports that he quit smoking  about 22 years ago. His smoking use included Cigarettes. He quit after 15 years of use. He does not have any smokeless tobacco history on file. He reports that he does not drink alcohol or use illicit drugs. family history includes Drug abuse in his sister. Allergies  Allergen Reactions  . Penicillins     REACTION: High fever  . Sulfonamide Derivatives     REACTION: joints ache       Objective:   Physical Exam  Constitutional: He appears well-developed and well-nourished.  HENT:  Head: Normocephalic and atraumatic.  Eyes: Conjunctivae are normal. Pupils are equal, round, and reactive to light.  Neck: Normal range of motion. Neck supple.  Cardiovascular: Normal rate and regular rhythm.   Pulmonary/Chest: Effort normal and breath sounds normal.  Abdominal: Soft. Bowel sounds are normal.          Assessment & Plan:  Blood pressure is well controlled on current medications no changes anticipated Wound is improving antibiotics have been completed has an appointment with the surgeon today Lungs those are clear there is no active asthma He is on a dealer and Singulair

## 2011-04-15 ENCOUNTER — Encounter (INDEPENDENT_AMBULATORY_CARE_PROVIDER_SITE_OTHER): Payer: Self-pay | Admitting: General Surgery

## 2011-04-15 NOTE — Consult Note (Signed)
Tyler [name], Tyler [name]             ACCOUNT NO.:  [id number]  MEDICAL RECORD NO.:  [id number]           PATIENT TYPE:  I  LOCATION:  5157                         FACILITY:  [facility]  PHYSICIAN:  Sundra Aland, MD      DATE OF BIRTH:  [birth year]  DATE OF CONSULTATION: DATE OF DISCHARGE:                                CONSULTATION   PRIMARY CARE PHYSICIAN:  Stacie Glaze, MD  REFERRING PHYSICIAN:  Ollen Gross. Vernell Morgans, MD  REASON FOR ADMISSION:  Appendicitis status post laparoscopic appendectomy.  REASON FOR CONSULT:  Uncontrolled hypertension.  HISTORY OF PRESENTING COMPLAINT:  Tyler [name] is a pleasant 61- year-old Caucasian male with no known history of hypertension although he says that occasionally his blood pressure rises to 140 systolic, but Ke requested doctor's visit.  Here the ward has sustained high blood pressure.  He has a history of asthma, shingles for which he is on a suppressive dose of Valtrex, and also erectile dysfunction for which he is on Cialis.  The patient was admitted on Monday, Mar 22, 2011, because of borderline ruptured appendix and underwent a laparoscopic appendectomy on the same day.  Plans are to discharge him but the blood pressure has been a little bit difficult to control.  He has received some low dose of Lopressor but still blood pressure has remained in the 150, highest is 169, between 93, I am going to refer.  The primary care physician was consulted to help in the management of his blood pressure.  PAST MEDICAL HISTORY: 1. Asthma. 2. Shingles. 3. Erectile dysfunction.  ALLERGIES:  The patient is allergic to PENICILLIN.  SOCIAL HISTORY:  The patient does not smoke.  Drinks alcohol very mildly.  Does not do any drugs.  SURGICAL HISTORY:  Significant for appendectomy which he had on Mar 22, 2011.  He also had a right knee arthroscopic surgery in his meniscus.  FAMILY HISTORY:  Mother had hypertension in the last 2 years of her  life and she deceased not too long ago.  CURRENT MEDICATIONS:  The patient is currently on Astelin, Cipro, clonidine, Flonase, Advair, Mobic, Singulair, Protonix, Valtrex, Tylenol, Ventolin inhaler, Norco, p.r.n. Lopressor, morphine, and Zofran.  REVIEW OF SYSTEMS:  Nine-point system review, no further contributory.  PHYSICAL EXAMINATION:  GENERAL:  The patient does not appear to be in any acute distress, and was found sitting in his chair in his room. VITAL SIGNS:  Blood pressure is 150/100, heart rate is 89, respirations 18, temperature 98.5, saturation 100% on room air. HEENT:  Atraumatic and normocephalic.  He is not jaundiced.  Mucous membranes are moist. NECK:  Supple.  Range of motion is full without thyromegaly or lymphadenopathy. LUNGS:  Good air entry with no wheezes, rhonchi, or rales. CARDIOVASCULAR:  S1 and S2 without murmur, rub, or gallop. ABDOMEN:  Full, soft, nontender, nondistended.  Active bowel sounds. EXTREMITIES:  No cyanosis, no clubbing.  No pretibial edema.  DIAGNOSTIC DATA:  CT of the abdomen and pelvis done on the day ofadmission shows changes of acute appendicitis with inflammatory changes around appendix, no focal abscess.  DIAGNOSTIC IMPRESSION: 1. Uncontrolled  hypertension and the patient is not a known     antihypertensive and the wrist pain postoperatively is adequately     controlled. 2. Status post laparoscopic appendectomy about 5 days ago. 3. Asthma, currently inactive. 4. Erectile dysfunction. 5. History of shingles.  RECOMMENDATIONS:  I will continue the patient on low-dose Lopressor 25 mg p.o. b.i.d.  Next, I will add Norvasc 5 mg in view of elevation of his diastolic blood pressure.  If systolic blood pressure is below 140 and diastolic blood pressure is below 90, the patient can be discharged tomorrow to continue on this medication and follow his primary care physician.  Thank you for asking [place] to participate in the care of this  patient.     Sundra Aland, MD     LA/MEDQ  D:  03/27/2011  T:  03/27/2011  Job:  [id number]  Electronically Signed by Sundra Aland MD on 04/15/2011 05:11:45 PM

## 2011-04-26 ENCOUNTER — Other Ambulatory Visit: Payer: Self-pay | Admitting: *Deleted

## 2011-04-26 MED ORDER — METOPROLOL TARTRATE 25 MG PO TABS: 25.0000 mg | ORAL_TABLET | Freq: Two times a day (BID) | ORAL | Status: DC

## 2011-04-28 ENCOUNTER — Other Ambulatory Visit: Payer: Self-pay | Admitting: *Deleted

## 2011-04-28 MED ORDER — AMLODIPINE BESYLATE 5 MG PO TABS: 5.0000 mg | ORAL_TABLET | Freq: Every day | ORAL | Status: DC

## 2011-04-30 ENCOUNTER — Encounter: Payer: Self-pay | Admitting: Internal Medicine

## 2011-05-10 ENCOUNTER — Ambulatory Visit (INDEPENDENT_AMBULATORY_CARE_PROVIDER_SITE_OTHER): Payer: BC Managed Care – PPO | Admitting: General Surgery

## 2011-05-10 ENCOUNTER — Encounter (INDEPENDENT_AMBULATORY_CARE_PROVIDER_SITE_OTHER): Payer: Self-pay | Admitting: General Surgery

## 2011-05-10 VITALS — BP 144/88 | HR 78 | Temp 97.8°F | Resp 16 | Ht 71.0 in | Wt 165.4 lb

## 2011-05-10 DIAGNOSIS — T8140XA Infection following a procedure, unspecified, initial encounter: Secondary | ICD-10-CM

## 2011-05-10 DIAGNOSIS — T8149XA Infection following a procedure, other surgical site, initial encounter: Secondary | ICD-10-CM

## 2011-05-10 MED ORDER — DOXYCYCLINE HYCLATE 100 MG PO TABS: 100.0000 mg | ORAL_TABLET | Freq: Two times a day (BID) | ORAL | Status: AC

## 2011-05-10 NOTE — Progress Notes (Signed)
Subjective:     Patient ID: Tyler [name], male   DOB: December 06, 1950, 60 y.o.   MRN: [id number]    BP 144/88  Pulse 78  Temp(Src) 97.8 F (36.6 C) (Temporal)  Resp 16  Ht 5\' 11"  (1.803 m)  Wt 165 lb 6.4 oz (75.025 kg)  BMI 23.07 kg/m2    HPI He underwent an appendectomy by Dr. Carolynne Edouard 59 2012 by laparoscopic technique. This was complicated by postoperative wound infection in the umbilical area. States the area is now swelling  and turning slightly red. No fever. He presents to have this evaluated.  Review of Systems     Objective:   Physical Exam He is in no acute distress.  Abdomen: Soft nontender. There is a subumbilical scar with firmness and slight erythema. It is not fluctuant. A limited ultrasound was performed and no fluid was noted. The other small incisions are clean and intact.    Assessment:     Postoperative wound infection.    Plan:    Will start doxycycline b.i.d. Warm compresses and massage 3 times a day. Follow up with Dr. Carolynne Edouard one week.

## 2011-05-10 NOTE — Patient Instructions (Signed)
Stop the doxycycline 40 mg. Start doxycycline on 100 mg as directed. Removed bandage tomorrow. Apply warm moist compresses to the area 5 minutes 2-3 times per day. Call the office if the situation gets worse before your appointment with Dr. Carolynne Edouard.

## 2011-05-11 ENCOUNTER — Other Ambulatory Visit: Payer: Self-pay | Admitting: Internal Medicine

## 2011-05-14 ENCOUNTER — Encounter (INDEPENDENT_AMBULATORY_CARE_PROVIDER_SITE_OTHER): Payer: Self-pay | Admitting: General Surgery

## 2011-05-17 ENCOUNTER — Encounter (INDEPENDENT_AMBULATORY_CARE_PROVIDER_SITE_OTHER): Payer: Self-pay | Admitting: General Surgery

## 2011-05-17 ENCOUNTER — Ambulatory Visit (INDEPENDENT_AMBULATORY_CARE_PROVIDER_SITE_OTHER): Payer: BC Managed Care – PPO | Admitting: General Surgery

## 2011-05-17 VITALS — Temp 97.8°F | Wt 166.8 lb

## 2011-05-17 DIAGNOSIS — K37 Unspecified appendicitis: Secondary | ICD-10-CM

## 2011-05-17 NOTE — Patient Instructions (Signed)
Warm compresses to left arm. Incision looks good. Finish the antibiotics. F/U in 2 weeks

## 2011-05-17 NOTE — Progress Notes (Signed)
Subjective:     Patient ID: Tyler [name], male   DOB: [birth year], 60 y.o.   MRN: [id number]    Temp(Src) 97.8 F (36.6 C) (Temporal)  Wt 166 lb 12.8 oz (75.66 kg)    HPI 60 yo wm 1 month out from a lap appy. Post op course complicated by a small superficial wound infection. He saw a partner recently who put him on some antibiotics for some cellulitis.  Review of Systems     Objective:   Physical Exam Abdomen is soft and nontender. Redness has resolved. Normal feeling scar. Also has some bruising of left arm where IV was. No evidence of thrombosis of vein.    Assessment:     Superficial postop infection    Plan:     Finish antibiotics. F/U in 2 weeks

## 2011-05-26 NOTE — Discharge Summary (Signed)
  NAMELARENCE, THONE             ACCOUNT NO.:  [id number]  MEDICAL RECORD NO.:  [id number]  LOCATION:  5157                         FACILITY:  [facility]  PHYSICIAN:  Almond Lint, MD       DATE OF BIRTH:  1951-01-08  DATE OF ADMISSION:  03/22/2011 DATE OF DISCHARGE:  03/28/2011                              DISCHARGE SUMMARY   HISTORY OF PRESENT ILLNESS:  [name] is a pleasant gentleman who presented with complaint of abdominal pain.  He was seen by Dr. Carolynne Edouard in the emergency department and was found to have evidence of appendicitis. Decision was made that the patient would need to be admitted for surgical intervention.  SUMMARY OF HOSPITAL COURSE:  The patient was admitted on Mar 22, 2011, he was taken to the operating room by Dr. Carolynne Edouard same day and underwent laparoscopic appendectomy.  He seemed to do well initially and postoperatively having a little bit of some puniness and mild ileus that took a couple days to start to resolve.  However, during this time period, the patient developed some new-onset hypertension that was difficult to control with a single agent.  Therefore, the Hospitalist Service was consulted for additional recommendations on his blood pressure regimen.  He was started on Lopressor and Norvasc.  This showed significant improvement after some changes were made in these medications and it was felt he could go home on these two medications. He otherwise was tolerating regular diet and having good bowel function.  He did develop a little bit of a superficial wound infection at his umbilical incision, the Dermabond was removed and a swab was used to probe into a small pocket of purulent fluid.  Small wick of packing was placed and there to allow better drainage.  After all this was addressed, it was felt that the patient was stable for discharge home as of Mar 28, 2011.  DISCHARGE DIAGNOSIS: 1. Acute appendicitis, status post laparoscopic appendectomy. 2.  New-onset hypertension - stable on new medications. 3. Superficial wound infection - stable on p.o. antibiotics.  DISCHARGE MEDICATIONS: 1. Norvasc 5 mg 1 tablet daily. 2. Cipro 500 mg 1 tablet twice daily. 3. Vicodin 5/325 one to two tablets q.4 h. p.r.n. pain. 4. Metoprolol 25 mg twice daily. 5. Albuterol nebulizer as directed at home. 6. Astepro 1 spray nasally every morning. 7. Cialis 1 tablet daily as needed. 8. Dulera 2 puffs twice daily. 9. Meloxicam 15 mg 1 tablet daily. 10.Nexium 40 mg daily. 11.ProAir albuterol inhaler 2 puffs q. 4 hours p.r.n. 12.Singulair 10 mg daily. 13.Valtrex 1 g 1 tablet daily. 14.Veramyst  nasal spray 1 spray daily p.r.n.  The patient is given preprinted discharge instructions as to follow up in our clinic in approximately 1-2 weeks.     Tyler El, PA-C   ______________________________ Almond Lint, MD    KB/MEDQ  D:  05/10/2011  T:  05/11/2011  Job:  [id number]  Electronically Signed by Tyler [name]  on 05/26/2011 02:53:42 PM Electronically Signed by Almond Lint MD on 05/26/2011 03:29:53 PM

## 2011-05-31 ENCOUNTER — Ambulatory Visit (INDEPENDENT_AMBULATORY_CARE_PROVIDER_SITE_OTHER): Payer: BC Managed Care – PPO | Admitting: General Surgery

## 2011-05-31 ENCOUNTER — Encounter (INDEPENDENT_AMBULATORY_CARE_PROVIDER_SITE_OTHER): Payer: Self-pay | Admitting: General Surgery

## 2011-05-31 DIAGNOSIS — K3580 Unspecified acute appendicitis: Secondary | ICD-10-CM

## 2011-05-31 NOTE — Progress Notes (Signed)
Subjective:     Patient ID: Tyler [name], male   DOB: 1951-05-26, 60 y.o.   MRN: [id number]  HPI The patient is a 60 year old white male who is now 2 months out from a laparoscopic appendectomy . His postoperative course was complicated by a small superficial wound infection at the infraumbilical incision . He only complains of some minor soreness in this area now. He's had no further drainage from the area. He denies any fevers or chills. His appetite is good. His bowels are working normally. Review of Systems     Objective:   Physical Exam On exam his abdomen is soft and nontender. His incision has healed well. There is no sign of infection.   Assessment:     2 months postop from a lap Appendectomy.    Plan:     At this point the patient can return to all his normal activities without any restrictions. He will call us if he has any problems in the future. Otherwise we'll see him back on a p.r.n. basis.

## 2011-05-31 NOTE — Patient Instructions (Signed)
May return to all normal activities 

## 2011-06-08 ENCOUNTER — Encounter: Payer: Self-pay | Admitting: Internal Medicine

## 2011-06-08 ENCOUNTER — Other Ambulatory Visit: Payer: Self-pay | Admitting: Internal Medicine

## 2011-06-08 ENCOUNTER — Ambulatory Visit (INDEPENDENT_AMBULATORY_CARE_PROVIDER_SITE_OTHER): Payer: BC Managed Care – PPO | Admitting: Internal Medicine

## 2011-06-08 DIAGNOSIS — K645 Perianal venous thrombosis: Secondary | ICD-10-CM

## 2011-06-08 DIAGNOSIS — L02219 Cutaneous abscess of trunk, unspecified: Secondary | ICD-10-CM

## 2011-06-08 DIAGNOSIS — E291 Testicular hypofunction: Secondary | ICD-10-CM

## 2011-06-08 DIAGNOSIS — R635 Abnormal weight gain: Secondary | ICD-10-CM

## 2011-06-08 DIAGNOSIS — L03316 Cellulitis of umbilicus: Secondary | ICD-10-CM

## 2011-06-08 MED ORDER — MUPIROCIN CALCIUM 2 % EX CREA: TOPICAL_CREAM | CUTANEOUS | Status: AC

## 2011-06-08 MED ORDER — HYDROCORTISONE ACE-PRAMOXINE 2.5-1 % RE CREA: TOPICAL_CREAM | Freq: Two times a day (BID) | RECTAL | Status: DC

## 2011-06-08 NOTE — Progress Notes (Signed)
  Subjective:    Patient ID: Tyler [name], male    DOB: 1951-07-29, 60 y.o.   MRN: [id number]  HPI Blood pressure is stable Mild irritation and cellulitis at site of umbilical surgery    Review of Systems  Constitutional: Negative for fever and fatigue.  HENT: Negative for hearing loss, congestion, neck pain and postnasal drip.   Eyes: Negative for discharge, redness and visual disturbance.  Respiratory: Negative for cough, shortness of breath and wheezing.   Cardiovascular: Negative for leg swelling.  Gastrointestinal: Negative for abdominal pain, constipation and abdominal distention.  Genitourinary: Negative for urgency and frequency.  Musculoskeletal: Negative for joint swelling and arthralgias.  Skin: Negative for color change and rash.  Neurological: Negative for weakness and light-headedness.  Hematological: Negative for adenopathy.  Psychiatric/Behavioral: Negative for behavioral problems.   Past Medical History  Diagnosis Date  . External hemorrhoids with other complication 09/18/2008  . ASTHMA 05/11/2007  . GERD 06/28/2007  . Pityriasis rosea 06/28/2007  . FASCIITIS, PLANTAR 05/14/2010  . Thrombocytopenia 12/03/2010  . Arthritis   . Rash     bruises easily per medical history form dated 04/05/11.   Past Surgical History  Procedure Date  . Knee arthroscopy w/ debridement 2006    surgeon Allusio  . Tonsillectomy     at age 31  . Cholecystectomy 03/20/2011  . Appendectomy 03/2011    reports that he quit smoking about 22 years ago. His smoking use included Cigarettes. He quit after 15 years of use. He does not have any smokeless tobacco history on file. He reports that he drinks about one ounce of alcohol per week. He reports that he does not use illicit drugs. family history includes Drug abuse in his sister. Allergies  Allergen Reactions  . Penicillins     REACTION: High fever  . Sulfonamide Derivatives     REACTION: joints ache  . Altabax (Retapamulin) Rash        Objective:   Physical Exam  Nursing note and vitals reviewed. Constitutional: He appears well-developed and well-nourished.  HENT:  Head: Normocephalic and atraumatic.  Eyes: Conjunctivae are normal. Pupils are equal, round, and reactive to light.  Neck: Normal range of motion. Neck supple.  Cardiovascular: Normal rate and regular rhythm.   Pulmonary/Chest: Effort normal and breath sounds normal.  Abdominal: Soft. Bowel sounds are normal.          Assessment & Plan:  Centripetal weight gain Discussion of testosterone supplementation Discussion of routes of supplementation  I have spent more than 30 minutes examining this patient face-to-face of which over half was spent in counseling We have prescribed Bactroban cream to the site of the cellulitis around the umbilicus. We will add a testosterone level to his physical labs scheduled for October His asthma and blood pressure appear to be stable his gastroesophageal reflux appears to be stable

## 2011-06-11 ENCOUNTER — Ambulatory Visit: Payer: BC Managed Care – PPO | Admitting: Internal Medicine

## 2011-07-14 ENCOUNTER — Other Ambulatory Visit: Payer: Self-pay | Admitting: Internal Medicine

## 2011-08-09 ENCOUNTER — Other Ambulatory Visit: Payer: Self-pay | Admitting: Internal Medicine

## 2011-09-02 ENCOUNTER — Other Ambulatory Visit (INDEPENDENT_AMBULATORY_CARE_PROVIDER_SITE_OTHER): Payer: BC Managed Care – PPO

## 2011-09-02 DIAGNOSIS — Z0000 Encounter for general adult medical examination without abnormal findings: Secondary | ICD-10-CM

## 2011-09-02 DIAGNOSIS — E291 Testicular hypofunction: Secondary | ICD-10-CM

## 2011-09-02 LAB — CBC WITH DIFFERENTIAL/PLATELET
Basophils Relative: 0.5 % (ref 0.0–3.0)
Eosinophils Absolute: 0.1 10*3/uL (ref 0.0–0.7)
Hemoglobin: 14.7 g/dL (ref 13.0–17.0)
MCHC: 33.9 g/dL (ref 30.0–36.0)
MCV: 100.6 fl — ABNORMAL HIGH (ref 78.0–100.0)
Monocytes Absolute: 0.4 10*3/uL (ref 0.1–1.0)
Neutro Abs: 3 10*3/uL (ref 1.4–7.7)
Neutrophils Relative %: 54.4 % (ref 43.0–77.0)
RBC: 4.31 Mil/uL (ref 4.22–5.81)
RDW: 12.5 % (ref 11.5–14.6)

## 2011-09-02 LAB — BASIC METABOLIC PANEL
CO2: 30 mEq/L (ref 19–32)
Chloride: 103 mEq/L (ref 96–112)
Creatinine, Ser: 1 mg/dL (ref 0.4–1.5)
Glucose, Bld: 90 mg/dL (ref 70–99)

## 2011-09-02 LAB — HEPATIC FUNCTION PANEL
Albumin: 4.4 g/dL (ref 3.5–5.2)
Total Protein: 6.4 g/dL (ref 6.0–8.3)

## 2011-09-02 LAB — LIPID PANEL
HDL: 54.2 mg/dL (ref >39.00)
Triglycerides: 141 mg/dL (ref 0.0–149.0)

## 2011-09-02 LAB — POCT URINALYSIS DIPSTICK
Blood, UA: NEGATIVE
Ketones, UA: NEGATIVE
Protein, UA: NEGATIVE
Spec Grav, UA: 1.015
pH, UA: 7

## 2011-09-02 LAB — PSA: PSA: 0.2 ng/mL (ref 0.10–4.00)

## 2011-09-03 LAB — TESTOSTERONE, FREE, TOTAL, SHBG
Sex Hormone Binding: 39 nmol/L (ref 13–71)
Testosterone, Free: 87.9 pg/mL (ref 47.0–244.0)
Testosterone-% Free: 1.9 % (ref 1.6–2.9)
Testosterone: 469.2 ng/dL (ref 250–890)

## 2011-09-09 ENCOUNTER — Encounter: Payer: Self-pay | Admitting: Internal Medicine

## 2011-09-09 ENCOUNTER — Ambulatory Visit (INDEPENDENT_AMBULATORY_CARE_PROVIDER_SITE_OTHER): Payer: BC Managed Care – PPO | Admitting: Internal Medicine

## 2011-09-09 DIAGNOSIS — T887XXA Unspecified adverse effect of drug or medicament, initial encounter: Secondary | ICD-10-CM

## 2011-09-09 DIAGNOSIS — Z0000 Encounter for general adult medical examination without abnormal findings: Secondary | ICD-10-CM

## 2011-09-09 DIAGNOSIS — I10 Essential (primary) hypertension: Secondary | ICD-10-CM

## 2011-09-09 DIAGNOSIS — K219 Gastro-esophageal reflux disease without esophagitis: Secondary | ICD-10-CM

## 2011-09-09 DIAGNOSIS — K3580 Unspecified acute appendicitis: Secondary | ICD-10-CM

## 2011-09-09 DIAGNOSIS — J45909 Unspecified asthma, uncomplicated: Secondary | ICD-10-CM

## 2011-09-09 MED ORDER — BISOPROLOL FUMARATE 5 MG PO TABS: 5.0000 mg | ORAL_TABLET | Freq: Every day | ORAL | Status: DC

## 2011-09-09 NOTE — Progress Notes (Signed)
Subjective:    Patient ID: Tyler [name], male    DOB: 03-07-1951, 60 y.o.   MRN: [id number]  HPI CPX Weight gain and increased lipids On HTN meds and feels  Side effects persistent anal rash that has not improved There is no raised area visible rash to observation or touch the area is erythematous and irritated.  He states that he has noted cool extremities after taking his beta blocker. His asthma has been generally stable with no recent excessive use of his rescue inhalers     Review of Systems  Constitutional: Negative for fever and fatigue.  HENT: Negative for hearing loss, congestion, neck pain and postnasal drip.   Eyes: Negative for discharge, redness and visual disturbance.  Respiratory: Negative for cough, shortness of breath and wheezing.   Cardiovascular: Negative for leg swelling.  Gastrointestinal: Negative for abdominal pain, constipation and abdominal distention.  Genitourinary: Negative for urgency and frequency.  Musculoskeletal: Negative for joint swelling and arthralgias.  Skin: Negative for color change and rash.  Neurological: Negative for weakness and light-headedness.  Hematological: Negative for adenopathy.  Psychiatric/Behavioral: Negative for behavioral problems.   Past Medical History  Diagnosis Date  . External hemorrhoids with other complication 09/18/2008  . ASTHMA 05/11/2007  . GERD 06/28/2007  . Pityriasis rosea 06/28/2007  . FASCIITIS, PLANTAR 05/14/2010  . Thrombocytopenia 12/03/2010  . Arthritis   . Rash     bruises easily per medical history form dated 04/05/11.   Past Surgical History  Procedure Date  . Knee arthroscopy w/ debridement 2006    surgeon Allusio  . Tonsillectomy     at age 45  . Cholecystectomy 03/20/2011  . Appendectomy 03/2011    reports that he quit smoking about 22 years ago. His smoking use included Cigarettes. He quit after 15 years of use. He does not have any smokeless tobacco history on file. He reports that he  drinks about one ounce of alcohol per week. He reports that he does not use illicit drugs. family history includes Drug abuse in his sister. Allergies  Allergen Reactions  . Penicillins     REACTION: High fever  . Sulfonamide Derivatives     REACTION: joints ache  . Altabax (Retapamulin) Rash   Past Medical History  Diagnosis Date  . External hemorrhoids with other complication 09/18/2008  . ASTHMA 05/11/2007  . GERD 06/28/2007  . Pityriasis rosea 06/28/2007  . FASCIITIS, PLANTAR 05/14/2010  . Thrombocytopenia 12/03/2010  . Arthritis   . Rash     bruises easily per medical history form dated 04/05/11.   Past Surgical History  Procedure Date  . Knee arthroscopy w/ debridement 2006    surgeon Allusio  . Tonsillectomy     at age 36  . Cholecystectomy 03/20/2011  . Appendectomy 03/2011    reports that he quit smoking about 22 years ago. His smoking use included Cigarettes. He quit after 15 years of use. He does not have any smokeless tobacco history on file. He reports that he drinks about one ounce of alcohol per week. He reports that he does not use illicit drugs. family history includes Drug abuse in his sister. Allergies  Allergen Reactions  . Penicillins     REACTION: High fever  . Sulfonamide Derivatives     REACTION: joints ache  . Altabax (Retapamulin) Rash       Objective:   Physical Exam  Nursing note and vitals reviewed. Constitutional: He is oriented to person, place, and time. He appears well-developed  and well-nourished.  HENT:  Head: Normocephalic and atraumatic.  Eyes: Conjunctivae are normal. Pupils are equal, round, and reactive to light.  Neck: Normal range of motion. Neck supple.  Cardiovascular: Normal rate and regular rhythm.   Pulmonary/Chest: Effort normal and breath sounds normal.  Abdominal: Soft. Bowel sounds are normal.  Genitourinary: Rectum normal.       Anal erythema  Musculoskeletal: Normal range of motion.  Neurological: He is alert and  oriented to person, place, and time.  Skin: Rash noted.   perianal area is erythematous without visible rash there is internal and external hemorrhoids        Assessment & Plan:   Patient presents for yearly preventative medicine examination.   all immunizations and health maintenance protocols were reviewed with the patient and they are up to date with these protocols.   screening laboratory values were reviewed with the patient including screening of hyperlipidemia PSA renal function and hepatic function.   There medications past medical history social history problem list and allergies were reviewed in detail.   Goals were established with regard to weight loss exercise diet in compliance with medications His blood pressure medications may be causing side effects particularly his beta blockers.  We discussed changing from metoprolol to bisoprolol and see if the same side effects persist.  Does have a history of asthma and his asthma has been relatively stable primary complaint is cold extremities with taking a beta blocker.  He was a former smoker.  His other problems are stable at this time

## 2011-09-09 NOTE — Patient Instructions (Signed)
Change the Lopressor or metoprolol to bisoprolol

## 2011-09-10 ENCOUNTER — Encounter: Payer: Self-pay | Admitting: Internal Medicine

## 2011-09-10 ENCOUNTER — Other Ambulatory Visit: Payer: Self-pay | Admitting: Internal Medicine

## 2011-09-10 MED ORDER — ALBUTEROL SULFATE (2.5 MG/3ML) 0.083% IN NEBU: 2.5000 mg | INHALATION_SOLUTION | Freq: Three times a day (TID) | RESPIRATORY_TRACT | Status: DC | PRN

## 2011-09-10 NOTE — Telephone Encounter (Signed)
Pt called req refill of albuterol (PROVENTIL) (2.5 MG/3ML) 0.083% nebulizer solution to [facility] on [address] [phone]

## 2011-09-10 NOTE — Telephone Encounter (Signed)
Called in and sent in

## 2011-09-13 ENCOUNTER — Telehealth: Payer: Self-pay | Admitting: *Deleted

## 2011-09-13 MED ORDER — HYDROCODONE-HOMATROPINE 5-1.5 MG/5ML PO SYRP: 5.0000 mL | ORAL_SOLUTION | Freq: Four times a day (QID) | ORAL | Status: AC | PRN

## 2011-09-13 MED ORDER — PREDNISONE 10 MG PO KIT: 10.0000 mg | PACK | ORAL | Status: DC

## 2011-09-13 MED ORDER — AZITHROMYCIN 250 MG PO TABS: ORAL_TABLET | ORAL | Status: AC

## 2011-09-13 NOTE — Telephone Encounter (Signed)
Notified pt and pharmacy

## 2011-09-13 NOTE — Telephone Encounter (Addendum)
Pt has developed a sinus infection, drainage is "disgusting", and is moving to chest.  Would like an antibiotic, steroids and Hydromet if Dr. Lovell Sheehan would agree.  No fever.

## 2011-09-13 NOTE — Telephone Encounter (Signed)
Per dr Lovell Sheehan- may have z pack- 10mg  12 day prednisone dose pack and hydromet 6oz 1 tsp every 6 hrs prn

## 2011-09-22 ENCOUNTER — Other Ambulatory Visit: Payer: Self-pay | Admitting: Internal Medicine

## 2011-10-15 ENCOUNTER — Other Ambulatory Visit: Payer: Self-pay | Admitting: Internal Medicine

## 2011-10-22 ENCOUNTER — Telehealth: Payer: Self-pay | Admitting: Internal Medicine

## 2011-10-22 MED ORDER — FLUCONAZOLE 100 MG PO TABS: 100.0000 mg | ORAL_TABLET | Freq: Every day | ORAL | Status: DC

## 2011-10-22 NOTE — Telephone Encounter (Signed)
Wants to see DR Lovell Sheehan about a rash around his bottom. It is itchy and red and is getting worse. Skin is cracking. Tried remedies. Had an upper resp infection last month and was given Prednisone ( he had a rash at that time) and the rash went away. Please advise.    [facility]/ [address]. Thanks.

## 2011-10-22 NOTE — Telephone Encounter (Signed)
A and d ointment and diflucan for 10 days

## 2011-11-04 ENCOUNTER — Other Ambulatory Visit: Payer: Self-pay | Admitting: Internal Medicine

## 2011-11-15 ENCOUNTER — Other Ambulatory Visit: Payer: Self-pay | Admitting: Internal Medicine

## 2011-12-01 ENCOUNTER — Other Ambulatory Visit: Payer: Self-pay | Admitting: Internal Medicine

## 2011-12-22 ENCOUNTER — Other Ambulatory Visit: Payer: Self-pay | Admitting: Internal Medicine

## 2011-12-24 ENCOUNTER — Other Ambulatory Visit: Payer: Self-pay | Admitting: Internal Medicine

## 2012-01-03 ENCOUNTER — Other Ambulatory Visit: Payer: Self-pay | Admitting: Internal Medicine

## 2012-01-10 ENCOUNTER — Ambulatory Visit: Payer: BC Managed Care – PPO | Admitting: Internal Medicine

## 2012-02-03 ENCOUNTER — Other Ambulatory Visit: Payer: Self-pay | Admitting: Internal Medicine

## 2012-02-10 ENCOUNTER — Other Ambulatory Visit: Payer: Self-pay | Admitting: Internal Medicine

## 2012-02-21 ENCOUNTER — Other Ambulatory Visit: Payer: Self-pay | Admitting: Internal Medicine

## 2012-02-24 ENCOUNTER — Other Ambulatory Visit: Payer: Self-pay | Admitting: Internal Medicine

## 2012-03-02 ENCOUNTER — Other Ambulatory Visit: Payer: Self-pay | Admitting: Internal Medicine

## 2012-03-03 ENCOUNTER — Other Ambulatory Visit: Payer: Self-pay | Admitting: Internal Medicine

## 2012-04-03 ENCOUNTER — Other Ambulatory Visit: Payer: Self-pay | Admitting: Internal Medicine

## 2012-04-13 ENCOUNTER — Other Ambulatory Visit: Payer: Self-pay | Admitting: Internal Medicine

## 2012-04-21 ENCOUNTER — Other Ambulatory Visit: Payer: Self-pay | Admitting: Internal Medicine

## 2012-05-05 ENCOUNTER — Other Ambulatory Visit: Payer: Self-pay | Admitting: Internal Medicine

## 2012-05-10 ENCOUNTER — Other Ambulatory Visit: Payer: Self-pay | Admitting: Internal Medicine

## 2012-06-10 ENCOUNTER — Other Ambulatory Visit: Payer: Self-pay | Admitting: Internal Medicine

## 2012-06-26 ENCOUNTER — Other Ambulatory Visit: Payer: Self-pay | Admitting: Internal Medicine

## 2012-07-04 ENCOUNTER — Other Ambulatory Visit: Payer: Self-pay | Admitting: Internal Medicine

## 2012-07-07 ENCOUNTER — Encounter: Payer: Self-pay | Admitting: Internal Medicine

## 2012-07-07 ENCOUNTER — Ambulatory Visit (INDEPENDENT_AMBULATORY_CARE_PROVIDER_SITE_OTHER): Payer: BC Managed Care – PPO | Admitting: Internal Medicine

## 2012-07-07 VITALS — BP 136/80 | HR 72 | Temp 98.2°F | Resp 16 | Ht 71.0 in | Wt 165.0 lb

## 2012-07-07 DIAGNOSIS — R059 Cough, unspecified: Secondary | ICD-10-CM

## 2012-07-07 DIAGNOSIS — R05 Cough: Secondary | ICD-10-CM

## 2012-07-07 DIAGNOSIS — J209 Acute bronchitis, unspecified: Secondary | ICD-10-CM

## 2012-07-07 DIAGNOSIS — R490 Dysphonia: Secondary | ICD-10-CM

## 2012-07-07 NOTE — Progress Notes (Signed)
Subjective:    Patient ID: Tyler [name], male    DOB: [birth year], 61 y.o.   MRN: [id number]  HPI 2 week URI with inflammation and swelling now has involved the chest with tightness and cough Hx of chronic bronchitis Hoarse    Review of Systems  Constitutional: Negative for fever and fatigue.  HENT: Positive for congestion, rhinorrhea and postnasal drip. Negative for hearing loss and neck pain.   Eyes: Negative for discharge, redness and visual disturbance.  Respiratory: Positive for wheezing. Negative for cough and shortness of breath.   Cardiovascular: Negative for leg swelling.  Gastrointestinal: Positive for nausea. Negative for abdominal pain, constipation and abdominal distention.       Gerd   Genitourinary: Negative for urgency and frequency.  Musculoskeletal: Positive for myalgias and joint swelling. Negative for arthralgias.  Skin: Negative for color change and rash.  Neurological: Negative for weakness and light-headedness.  Hematological: Negative for adenopathy.  Psychiatric/Behavioral: Negative for behavioral problems.   Past Medical History  Diagnosis Date  . External hemorrhoids with other complication 09/18/2008  . ASTHMA 05/11/2007  . GERD 06/28/2007  . Pityriasis rosea 06/28/2007  . FASCIITIS, PLANTAR 05/14/2010  . Thrombocytopenia 12/03/2010  . Arthritis   . Rash     bruises easily per medical history form dated 04/05/11.    History   Social History  . Marital Status: Single    Spouse Name: N/A    Number of Children: N/A  . Years of Education: N/A   Occupational History  . Not on file.   Social History Main Topics  . Smoking status: Former Smoker -- 15 years    Types: Cigarettes    Quit date: 11/15/1988  . Smokeless tobacco: Not on file  . Alcohol Use: 1.0 oz/week    2 drink(s) per week     3 oz nightly per history form dated 04/05/11.  . Drug Use: No  . Sexually Active: Yes   Other Topics Concern  . Not on file   Social History  Narrative  . No narrative on file    Past Surgical History  Procedure Date  . Knee arthroscopy w/ debridement 2006    surgeon Allusio  . Tonsillectomy     at age 84  . Cholecystectomy 03/20/2011  . Appendectomy 03/2011    Family History  Problem Relation Age of Onset  . Drug abuse Sister     Allergies  Allergen Reactions  . Penicillins     REACTION: High fever  . Sulfonamide Derivatives     REACTION: joints ache  . Altabax (Retapamulin) Rash    Current Outpatient Prescriptions on File Prior to Visit  Medication Sig Dispense Refill  . albuterol (PROAIR HFA) 108 (90 BASE) MCG/ACT inhaler Inhale 2 puffs into the lungs 2 (two) times daily as needed.       Marland Kitchen amLODipine (NORVASC) 5 MG tablet TAKE 1 TABLET BY MOUTH DAILY  30 tablet  10  . azelastine (ASTELIN) 137 MCG/SPRAY nasal spray 1 spray by Nasal route 2 (two) times daily. Use in each nostril as directed       . bisoprolol (ZEBETA) 5 MG tablet Take 1 tablet (5 mg total) by mouth at bedtime.  30 tablet  11  . CIALIS 5 MG tablet TAKE 1 TABLET BY MOUTH ONCE DAILY  30 tablet  0  . DULERA 100-5 MCG/ACT AERO INHALE 2 PUFFS BY MOUTH TWICE DAILY  13 g  10  . HYDROcodone-acetaminophen (NORCO) 5-325 MG per tablet Take  1 tablet by mouth every 6 (six) hours as needed.       . hydrocortisone-pramoxine (ANALPRAM-HC) 2.5-1 % rectal cream Place rectally 2 (two) times daily.  30 g  3  . montelukast (SINGULAIR) 10 MG tablet TAKE 1 TABLET BY MOUTH EVERY DAY  30 tablet  0  . NEXIUM 40 MG capsule TAKE ONE CAPSULE BY MOUTH TWICE DAILY  60 capsule  0  . valACYclovir (VALTREX) 1000 MG tablet TAKE 1 TABLET BY MOUTH EVERY DAY  30 tablet  2  . VERAMYST 27.5 MCG/SPRAY nasal spray USE 2 SPRAYS IN EACH NOSTRIL EVERY DAY  10 g  0  . DISCONTD: albuterol (PROVENTIL) (2.5 MG/3ML) 0.083% nebulizer solution Take 3 mLs (2.5 mg total) by nebulization every 8 (eight) hours as needed.  75 mL  11  . DISCONTD: NEXIUM 40 MG capsule TAKE ONE CAPSULE BY MOUTH TWICE DAILY   60 capsule  0    BP 136/80  Pulse 72  Temp 98.2 F (36.8 C)  Resp 16  Ht 5\' 11"  (1.803 m)  Wt 165 lb (74.844 kg)  BMI 23.01 kg/m2        Objective:   Physical Exam  Nursing note and vitals reviewed. Constitutional: He appears well-developed and well-nourished.  HENT:  Head: Normocephalic and atraumatic.  Eyes: Conjunctivae are normal. Pupils are equal, round, and reactive to light.  Neck: Normal range of motion. Neck supple.  Cardiovascular: Normal rate and regular rhythm.   Pulmonary/Chest: Effort normal and breath sounds normal.  Abdominal: Soft. Bowel sounds are normal.          Assessment & Plan:  avelox 400 x 6 days May have dose back is hoarseness persists next week Has CPX in october

## 2012-08-01 ENCOUNTER — Other Ambulatory Visit: Payer: Self-pay | Admitting: Internal Medicine

## 2012-08-03 ENCOUNTER — Other Ambulatory Visit: Payer: Self-pay | Admitting: Internal Medicine

## 2012-08-09 ENCOUNTER — Other Ambulatory Visit: Payer: Self-pay | Admitting: Internal Medicine

## 2012-08-21 ENCOUNTER — Other Ambulatory Visit: Payer: Self-pay | Admitting: *Deleted

## 2012-08-21 MED ORDER — MONTELUKAST SODIUM 10 MG PO TABS: 10.0000 mg | ORAL_TABLET | Freq: Every day | ORAL | Status: DC

## 2012-08-29 ENCOUNTER — Other Ambulatory Visit: Payer: Self-pay | Admitting: Internal Medicine

## 2012-09-04 ENCOUNTER — Other Ambulatory Visit (INDEPENDENT_AMBULATORY_CARE_PROVIDER_SITE_OTHER): Payer: BC Managed Care – PPO

## 2012-09-04 DIAGNOSIS — Z0000 Encounter for general adult medical examination without abnormal findings: Secondary | ICD-10-CM

## 2012-09-04 LAB — BASIC METABOLIC PANEL
BUN: 13 mg/dL (ref 6–23)
Creatinine, Ser: 0.9 mg/dL (ref 0.4–1.5)
GFR: 87.14 mL/min (ref >60.00)
Glucose, Bld: 90 mg/dL (ref 70–99)
Potassium: 4.7 mEq/L (ref 3.5–5.1)

## 2012-09-04 LAB — CBC WITH DIFFERENTIAL/PLATELET
Basophils Absolute: 0 10*3/uL (ref 0.0–0.1)
HCT: 42.5 % (ref 39.0–52.0)
Lymphs Abs: 1.8 10*3/uL (ref 0.7–4.0)
Monocytes Absolute: 0.4 10*3/uL (ref 0.1–1.0)
Monocytes Relative: 7.8 % (ref 3.0–12.0)
Neutrophils Relative %: 55.8 % (ref 43.0–77.0)
Platelets: 173 10*3/uL (ref 150.0–400.0)
RDW: 12.8 % (ref 11.5–14.6)
WBC: 5.1 10*3/uL (ref 4.5–10.5)

## 2012-09-04 LAB — PSA: PSA: 0.13 ng/mL (ref 0.10–4.00)

## 2012-09-04 LAB — LIPID PANEL
Cholesterol: 192 mg/dL (ref 0–200)
Triglycerides: 58 mg/dL (ref 0.0–149.0)
VLDL: 11.6 mg/dL (ref 0.0–40.0)

## 2012-09-04 LAB — POCT URINALYSIS DIPSTICK
Blood, UA: NEGATIVE
Glucose, UA: NEGATIVE
Nitrite, UA: NEGATIVE
Protein, UA: NEGATIVE
Urobilinogen, UA: 1
pH, UA: 7

## 2012-09-04 LAB — TSH: TSH: 1.6 u[IU]/mL (ref 0.35–5.50)

## 2012-09-04 LAB — HEPATIC FUNCTION PANEL
Albumin: 4.1 g/dL (ref 3.5–5.2)
Bilirubin, Direct: 0.1 mg/dL (ref 0.0–0.3)
Total Protein: 6.2 g/dL (ref 6.0–8.3)

## 2012-09-05 ENCOUNTER — Other Ambulatory Visit: Payer: Self-pay | Admitting: Internal Medicine

## 2012-09-05 LAB — HIV ANTIBODY (ROUTINE TESTING W REFLEX): HIV: NONREACTIVE

## 2012-09-11 ENCOUNTER — Encounter: Payer: Self-pay | Admitting: Internal Medicine

## 2012-09-11 ENCOUNTER — Ambulatory Visit (INDEPENDENT_AMBULATORY_CARE_PROVIDER_SITE_OTHER): Payer: BC Managed Care – PPO | Admitting: Internal Medicine

## 2012-09-11 VITALS — BP 124/80 | HR 72 | Temp 98.6°F | Resp 16 | Ht 71.0 in | Wt 165.0 lb

## 2012-09-11 DIAGNOSIS — Z0000 Encounter for general adult medical examination without abnormal findings: Secondary | ICD-10-CM

## 2012-09-11 DIAGNOSIS — I10 Essential (primary) hypertension: Secondary | ICD-10-CM

## 2012-09-11 DIAGNOSIS — Z23 Encounter for immunization: Secondary | ICD-10-CM

## 2012-09-11 NOTE — Progress Notes (Signed)
Subjective:    Patient ID: Tyler [name], male    DOB: 08-Sep-1951, 61 y.o.   MRN: [id number]  HPI  Patient presents for yearly wellness examination and his asthma has been stable he generally has been doing well medications were reviewed with patient's discussion appropriate health maintenance he'll be due a tetanus shot next year his colonoscopy was done in 2009 and he is on a 10 year recall  Review of Systems  Constitutional: Negative for fever and fatigue.  HENT: Negative for hearing loss, congestion, neck pain and postnasal drip.   Eyes: Negative for discharge, redness and visual disturbance.  Respiratory: Negative for cough, shortness of breath and wheezing.   Cardiovascular: Negative for leg swelling.  Gastrointestinal: Negative for abdominal pain, constipation and abdominal distention.  Genitourinary: Negative for urgency and frequency.  Musculoskeletal: Negative for joint swelling and arthralgias.  Skin: Negative for color change and rash.  Neurological: Negative for weakness and light-headedness.  Hematological: Negative for adenopathy.  Psychiatric/Behavioral: Negative for behavioral problems.   Past Medical History  Diagnosis Date  . External hemorrhoids with other complication 09/18/2008  . ASTHMA 05/11/2007  . GERD 06/28/2007  . Pityriasis rosea 06/28/2007  . FASCIITIS, PLANTAR 05/14/2010  . Thrombocytopenia 12/03/2010  . Arthritis   . Rash     bruises easily per medical history form dated 04/05/11.    History   Social History  . Marital Status: Single    Spouse Name: N/A    Number of Children: N/A  . Years of Education: N/A   Occupational History  . Not on file.   Social History Main Topics  . Smoking status: Former Smoker -- 15 years    Types: Cigarettes    Quit date: 11/15/1988  . Smokeless tobacco: Not on file  . Alcohol Use: 1.0 oz/week    2 drink(s) per week     3 oz nightly per history form dated 04/05/11.  . Drug Use: No  . Sexually Active: Yes     Other Topics Concern  . Not on file   Social History Narrative  . No narrative on file    Past Surgical History  Procedure Date  . Knee arthroscopy w/ debridement 2006    surgeon Allusio  . Tonsillectomy     at age 36  . Cholecystectomy 03/20/2011  . Appendectomy 03/2011    Family History  Problem Relation Age of Onset  . Drug abuse Sister     Allergies  Allergen Reactions  . Penicillins     REACTION: High fever  . Sulfonamide Derivatives     REACTION: joints ache  . Altabax (Retapamulin) Rash    Current Outpatient Prescriptions on File Prior to Visit  Medication Sig Dispense Refill  . albuterol (PROAIR HFA) 108 (90 BASE) MCG/ACT inhaler Inhale 2 puffs into the lungs 2 (two) times daily as needed.       Marland Kitchen amLODipine (NORVASC) 5 MG tablet TAKE 1 TABLET BY MOUTH DAILY  30 tablet  10  . azelastine (ASTELIN) 137 MCG/SPRAY nasal spray 1 spray by Nasal route 2 (two) times daily. Use in each nostril as directed       . bisoprolol (ZEBETA) 5 MG tablet TAKE 1 TABLET BY MOUTH EVERY NIGHT AT BEDTIME  90 tablet  0  . CIALIS 5 MG tablet TAKE 1 TABLET BY MOUTH ONCE DAILY  30 tablet  0  . DULERA 100-5 MCG/ACT AERO INHALE 2 PUFFS BY MOUTH TWICE DAILY  13 g  10  .  esomeprazole (NEXIUM) 40 MG capsule       . hydrocortisone-pramoxine (ANALPRAM-HC) 2.5-1 % rectal cream Place rectally 2 (two) times daily.  30 g  3  . montelukast (SINGULAIR) 10 MG tablet Take 1 tablet (10 mg total) by mouth at bedtime.  90 tablet  3  . NEXIUM 40 MG capsule TAKE ONE CAPSULE BY MOUTH TWICE DAILY  60 capsule  0  . valACYclovir (VALTREX) 1000 MG tablet TAKE 1 TABLET BY MOUTH EVERY DAY  30 tablet  2  . VERAMYST 27.5 MCG/SPRAY nasal spray USE 2 SPRAYS IN EACH NOSTRIL EVERY DAY  10 g  0    BP 124/80  Pulse 72  Temp 98.6 F (37 C)  Resp 16  Ht 5\' 11"  (1.803 m)  Wt 165 lb (74.844 kg)  BMI 23.01 kg/m2        Objective:   Physical Exam  Constitutional: He appears well-developed and well-nourished.   HENT:  Head: Normocephalic and atraumatic.  Eyes: Conjunctivae normal are normal. Pupils are equal, round, and reactive to light.  Neck: Normal range of motion. Neck supple.  Cardiovascular: Normal rate and regular rhythm.   Pulmonary/Chest: Effort normal and breath sounds normal.  Abdominal: Soft. Bowel sounds are normal.  Genitourinary: Prostate normal.  Musculoskeletal: Normal range of motion.  Neurological: He is alert.  Skin: Skin is warm and dry.          Assessment & Plan:  This is a 61 year old gentleman who presents for yearly wellness examination.    Patient presents for yearly preventative medicine examination.   all immunizations and health maintenance protocols were reviewed with the patient and they are up to date with these protocols.   screening laboratory values were reviewed with the patient including screening of hyperlipidemia PSA renal function and hepatic function.   There medications past medical history social history problem list and allergies were reviewed in detail.   Goals were established with regard to weight loss exercise diet in compliance with medications

## 2012-10-03 ENCOUNTER — Other Ambulatory Visit: Payer: Self-pay | Admitting: Dermatology

## 2012-10-13 ENCOUNTER — Other Ambulatory Visit: Payer: Self-pay | Admitting: Internal Medicine

## 2012-10-30 ENCOUNTER — Ambulatory Visit (INDEPENDENT_AMBULATORY_CARE_PROVIDER_SITE_OTHER): Payer: BC Managed Care – PPO | Admitting: Internal Medicine

## 2012-10-30 ENCOUNTER — Encounter: Payer: Self-pay | Admitting: Internal Medicine

## 2012-10-30 ENCOUNTER — Other Ambulatory Visit: Payer: Self-pay | Admitting: Internal Medicine

## 2012-10-30 VITALS — BP 118/88 | Temp 98.5°F | Wt 166.0 lb

## 2012-10-30 DIAGNOSIS — K648 Other hemorrhoids: Secondary | ICD-10-CM

## 2012-10-30 DIAGNOSIS — K642 Third degree hemorrhoids: Secondary | ICD-10-CM

## 2012-10-30 DIAGNOSIS — B001 Herpesviral vesicular dermatitis: Secondary | ICD-10-CM | POA: Insufficient documentation

## 2012-10-30 DIAGNOSIS — L309 Dermatitis, unspecified: Secondary | ICD-10-CM

## 2012-10-30 DIAGNOSIS — B009 Herpesviral infection, unspecified: Secondary | ICD-10-CM

## 2012-10-30 DIAGNOSIS — L259 Unspecified contact dermatitis, unspecified cause: Secondary | ICD-10-CM

## 2012-10-30 HISTORY — DX: Third degree hemorrhoids: K64.2

## 2012-10-30 MED ORDER — TRIAMCINOLONE ACETONIDE 0.1 % EX CREA: TOPICAL_CREAM | Freq: Two times a day (BID) | CUTANEOUS | Status: DC

## 2012-10-30 MED ORDER — ACYCLOVIR 5 % EX OINT: TOPICAL_OINTMENT | CUTANEOUS | Status: DC

## 2012-10-30 NOTE — Progress Notes (Signed)
Subjective:    Patient ID: Tyler [name], male    DOB: [birth year], 61 y.o.   MRN: [id number]  HPI  61 year old white male complains of cold sore for last several weeks. Patient reports this is the second episode since October. Symptoms started with tingling sensation in the corner of his right upper lip.  Coincidentally, patient notes flat red area in his inner thigh.  It is intermittently pruritic.   He also has exacerbation of uncomfortable hemorrhoids. He tried using Analpram-HC but it seems to further irritate his hemorrhoids.  He reports history of shingles and has been taking Valtrex 1000 mg once daily.   Review of Systems Negative for fever or chills, HIV antibody screening negative in October 2013  Past Medical History  Diagnosis Date  . External hemorrhoids with other complication 09/18/2008  . ASTHMA 05/11/2007  . GERD 06/28/2007  . Pityriasis rosea 06/28/2007  . FASCIITIS, PLANTAR 05/14/2010  . Thrombocytopenia 12/03/2010  . Arthritis   . Rash     bruises easily per medical history form dated 04/05/11.    History   Social History  . Marital Status: Single    Spouse Name: N/A    Number of Children: N/A  . Years of Education: N/A   Occupational History  . Not on file.   Social History Main Topics  . Smoking status: Former Smoker -- 15 years    Types: Cigarettes    Quit date: 11/15/1988  . Smokeless tobacco: Not on file  . Alcohol Use: 1.0 oz/week    2 drink(s) per week     Comment: 3 oz nightly per history form dated 04/05/11.  . Drug Use: No  . Sexually Active: Yes   Other Topics Concern  . Not on file   Social History Narrative  . No narrative on file    Past Surgical History  Procedure Date  . Knee arthroscopy w/ debridement 2006    surgeon Allusio  . Tonsillectomy     at age 61  . Cholecystectomy 03/20/2011  . Appendectomy 03/2011    Family History  Problem Relation Age of Onset  . Drug abuse Sister     Allergies  Allergen Reactions  .  Penicillins     REACTION: High fever  . Sulfonamide Derivatives     REACTION: joints ache  . Altabax (Retapamulin) Rash    Current Outpatient Prescriptions on File Prior to Visit  Medication Sig Dispense Refill  . albuterol (PROAIR HFA) 108 (90 BASE) MCG/ACT inhaler Inhale 2 puffs into the lungs 2 (two) times daily as needed.       Marland Kitchen amLODipine (NORVASC) 5 MG tablet TAKE 1 TABLET BY MOUTH DAILY  30 tablet  10  . azelastine (ASTELIN) 137 MCG/SPRAY nasal spray 1 spray by Nasal route 2 (two) times daily. Use in each nostril as directed       . bisoprolol (ZEBETA) 5 MG tablet TAKE 1 TABLET BY MOUTH EVERY NIGHT AT BEDTIME  90 tablet  0  . CIALIS 5 MG tablet TAKE 1 TABLET BY MOUTH ONCE DAILY  30 tablet  0  . DULERA 100-5 MCG/ACT AERO INHALE 2 PUFFS BY MOUTH TWICE DAILY  13 g  10  . fluconazole (DIFLUCAN) 100 MG tablet TAKE 1 TABLET BY MOUTH ONCE DAILY  10 tablet  0  . hydrocortisone-pramoxine (ANALPRAM-HC) 2.5-1 % rectal cream Place rectally 2 (two) times daily.  30 g  3  . montelukast (SINGULAIR) 10 MG tablet Take 1 tablet (10 mg total)  by mouth at bedtime.  90 tablet  3  . NEXIUM 40 MG capsule TAKE ONE CAPSULE BY MOUTH TWICE DAILY  60 capsule  0    BP 118/88  Temp 98.5 F (36.9 C) (Oral)  Wt 166 lb (75.297 kg)       Objective:   Physical Exam  Constitutional: He appears well-developed and well-nourished.  HENT:  Head: Normocephalic and atraumatic.  Right Ear: External ear normal.  Left Ear: External ear normal.       Shallow erythematous ulcer right upper lip (right corner of mouth)  Cardiovascular: Normal rate, regular rhythm and normal heart sounds.   No murmur heard. Pulmonary/Chest: Effort normal and breath sounds normal.  Genitourinary:       No obvious external hemorrhoids internal hemorrhoids  Skin:       1 cm macular slightly erythematous left inner thigh          Assessment & Plan:

## 2012-10-30 NOTE — Patient Instructions (Addendum)
Take valtrex 1000 mg twice daily for 5 days Use sitz bath as needed for hemorrhoid irritation Please call our office if your symptoms do not improve or gets worse. Follow up with Dr. Lovell Sheehan within 2 weeks

## 2012-10-30 NOTE — Assessment & Plan Note (Signed)
Patient has cold sore that is symptomatic of right upper lip. Patient advised to use Valtrex 1000 mg twice daily for 5 days. Also apply Zovirax cream 6 times a day for one week.

## 2012-10-30 NOTE — Assessment & Plan Note (Signed)
Patient has 1 cm macular area is slightly erythematous on inner aspect of left thigh. I suspect possible eczema. Trial of topical steroids.  Patient advised to call office if symptoms persist or worsen.

## 2012-10-30 NOTE — Assessment & Plan Note (Signed)
Patient has intermittent irritated internal hemorrhoids. Patient advised to use sitz bath. Use of bulk laxatives may also be helpful.

## 2012-11-01 ENCOUNTER — Telehealth: Payer: Self-pay | Admitting: *Deleted

## 2012-11-01 ENCOUNTER — Other Ambulatory Visit: Payer: Self-pay | Admitting: *Deleted

## 2012-11-01 MED ORDER — MOMETASONE FURO-FORMOTEROL FUM 100-5 MCG/ACT IN AERO: 2.0000 | INHALATION_SPRAY | Freq: Two times a day (BID) | RESPIRATORY_TRACT | Status: DC

## 2012-11-01 MED ORDER — BISOPROLOL FUMARATE 5 MG PO TABS: 5.0000 mg | ORAL_TABLET | Freq: Every day | ORAL | Status: DC

## 2012-11-01 NOTE — Telephone Encounter (Signed)
Pt aware.

## 2012-11-01 NOTE — Telephone Encounter (Signed)
Message copied by Jacqualyn Posey on Wed Nov 01, 2012 12:07 PM ------      Message from: Meda Coffee      Created: Mon Oct 30, 2012  8:02 PM       Call pt in AM.  I suggest he start using metamucil once to twice daily.  It will likely help his internal hemorrhoids from flare ups.            RY

## 2012-11-08 ENCOUNTER — Other Ambulatory Visit: Payer: Self-pay | Admitting: Internal Medicine

## 2012-11-09 ENCOUNTER — Other Ambulatory Visit: Payer: Self-pay | Admitting: Internal Medicine

## 2012-11-22 ENCOUNTER — Other Ambulatory Visit: Payer: Self-pay | Admitting: Internal Medicine

## 2012-12-03 ENCOUNTER — Other Ambulatory Visit: Payer: Self-pay | Admitting: Internal Medicine

## 2012-12-04 ENCOUNTER — Other Ambulatory Visit: Payer: Self-pay | Admitting: Internal Medicine

## 2012-12-11 ENCOUNTER — Other Ambulatory Visit: Payer: Self-pay | Admitting: *Deleted

## 2012-12-11 MED ORDER — ALBUTEROL SULFATE HFA 108 (90 BASE) MCG/ACT IN AERS: 2.0000 | INHALATION_SPRAY | Freq: Two times a day (BID) | RESPIRATORY_TRACT | Status: DC | PRN

## 2012-12-19 ENCOUNTER — Other Ambulatory Visit: Payer: Self-pay | Admitting: Internal Medicine

## 2012-12-21 ENCOUNTER — Ambulatory Visit (INDEPENDENT_AMBULATORY_CARE_PROVIDER_SITE_OTHER): Payer: BC Managed Care – PPO | Admitting: Internal Medicine

## 2012-12-21 ENCOUNTER — Encounter: Payer: Self-pay | Admitting: Internal Medicine

## 2012-12-21 VITALS — BP 122/86 | Temp 98.5°F | Wt 167.0 lb

## 2012-12-21 DIAGNOSIS — J329 Chronic sinusitis, unspecified: Secondary | ICD-10-CM

## 2012-12-21 DIAGNOSIS — R369 Urethral discharge, unspecified: Secondary | ICD-10-CM

## 2012-12-21 MED ORDER — DOXYCYCLINE HYCLATE 100 MG PO TABS: 100.0000 mg | ORAL_TABLET | Freq: Two times a day (BID) | ORAL | Status: DC

## 2012-12-21 NOTE — Assessment & Plan Note (Signed)
Patient reports abnormal penile discharge over last 1 week. He denies any high-risk sexual contacts. His symptoms have improved on its own.  Doxycycline for sinus infection should also cover organisms for urethritis. Patient advised to call office if symptoms persist or worsen.

## 2012-12-21 NOTE — Assessment & Plan Note (Signed)
62 year old white male with history of asthma with signs symptoms and possible sinusitis. Treat with doxycycline 100 mg twice daily for 10 days.

## 2012-12-21 NOTE — Patient Instructions (Addendum)
Please contact our office if your symptoms do not improve or gets worse.  

## 2012-12-21 NOTE — Progress Notes (Signed)
Subjective:    Patient ID: Tyler [name], male    DOB: 1951-04-12, 62 y.o.   MRN: [id number]  HPI  62 year old white male with history of asthma and hypertension complains of head cold symptoms.  He symptoms started on Sunday. He reports sinus congestion and cough. Patient reports symptoms gradually spreading to his chest. He notes sputum is discolored (brownish in color). He denies fever or chills. He complains of bilateral sinus congestion.  Patient also has noticed a penile discharge over last 1 week. Symptoms started one week ago. He noticed pus like stain in his underwear for 2 or 3 days. His symptoms seemed to resolve on their own. He has not engaged in unprotected sex.  Review of Systems Negative for dysuria, negative for fever chills  Past Medical History  Diagnosis Date  . External hemorrhoids with other complication 09/18/2008  . ASTHMA 05/11/2007  . GERD 06/28/2007  . Pityriasis rosea 06/28/2007  . FASCIITIS, PLANTAR 05/14/2010  . Thrombocytopenia 12/03/2010  . Arthritis   . Rash     bruises easily per medical history form dated 04/05/11.    History   Social History  . Marital Status: Single    Spouse Name: N/A    Number of Children: N/A  . Years of Education: N/A   Occupational History  . Not on file.   Social History Main Topics  . Smoking status: Former Smoker -- 15 years    Types: Cigarettes    Quit date: 11/15/1988  . Smokeless tobacco: Not on file  . Alcohol Use: 1.0 oz/week    2 drink(s) per week     Comment: 3 oz nightly per history form dated 04/05/11.  . Drug Use: No  . Sexually Active: Yes   Other Topics Concern  . Not on file   Social History Narrative  . No narrative on file    Past Surgical History  Procedure Date  . Knee arthroscopy w/ debridement 2006    surgeon Allusio  . Tonsillectomy     at age 62  . Cholecystectomy 03/20/2011  . Appendectomy 03/2011    Family History  Problem Relation Age of Onset  . Drug abuse Sister      Allergies  Allergen Reactions  . Penicillins     REACTION: High fever  . Sulfonamide Derivatives     REACTION: joints ache  . Altabax (Retapamulin) Rash    Current Outpatient Prescriptions on File Prior to Visit  Medication Sig Dispense Refill  . acyclovir ointment (ZOVIRAX) 5 % Apply 6 x daily for 7 days to affected area  30 g  1  . albuterol (PROAIR HFA) 108 (90 BASE) MCG/ACT inhaler Inhale 2 puffs into the lungs 2 (two) times daily as needed.  18 g  3  . amLODipine (NORVASC) 5 MG tablet TAKE 1 TABLET BY MOUTH DAILY  30 tablet  10  . azelastine (ASTELIN) 137 MCG/SPRAY nasal spray 1 spray by Nasal route 2 (two) times daily. Use in each nostril as directed       . bisoprolol (ZEBETA) 5 MG tablet Take 1 tablet (5 mg total) by mouth daily.  90 tablet  3  . bisoprolol (ZEBETA) 5 MG tablet TAKE 1 TABLET BY MOUTH EVERY NIGHT AT BEDTIME  90 tablet  0  . CIALIS 5 MG tablet TAKE 1 TABLET BY MOUTH ONCE DAILY  30 tablet  0  . DULERA 100-5 MCG/ACT AERO INHALE 2 PUFFS TWICE DAILY  39 g  0  . fluconazole (  DIFLUCAN) 100 MG tablet TAKE 1 TABLET BY MOUTH ONCE DAILY  10 tablet  0  . hydrocortisone-pramoxine (ANALPRAM-HC) 2.5-1 % rectal cream Place rectally 2 (two) times daily.  30 g  3  . HYDROMET 5-1.5 MG/5ML syrup TAKE 1 TEASPOONFUL BY MOUTH EVERY 6 HOURS AS NEEDED  180 mL  0  . montelukast (SINGULAIR) 10 MG tablet Take 1 tablet (10 mg total) by mouth at bedtime.  90 tablet  3  . NEXIUM 40 MG capsule TAKE ONE CAPSULE BY MOUTH TWICE DAILY  180 capsule  0  . triamcinolone cream (KENALOG) 0.1 % Apply topically 2 (two) times daily. Use for rash on left leg for 1-2 weeks  30 g  1  . valACYclovir (VALTREX) 1000 MG tablet TAKE 1 TABLET BY MOUTH EVERY DAY  30 tablet  0    BP 122/86  Temp 98.5 F (36.9 C) (Oral)  Wt 167 lb (75.751 kg)       Objective:   Physical Exam  Constitutional: He is oriented to person, place, and time. He appears well-developed and well-nourished.  HENT:  Head:  Normocephalic and atraumatic.  Right Ear: External ear normal.  Left Ear: External ear normal.       Oropharyngeal erythema  Eyes: Conjunctivae normal and EOM are normal.  Neck: Neck supple.       No neck tenderness  Cardiovascular: Normal rate, regular rhythm and normal heart sounds.   Pulmonary/Chest: Effort normal.       Slight coarse breath sounds bilaterally  Genitourinary: Penis normal.       Minimal clear discharge from meatus  Neurological: He is alert and oriented to person, place, and time. No cranial nerve deficit.  Psychiatric: He has a normal mood and affect. His behavior is normal.          Assessment & Plan:

## 2012-12-22 ENCOUNTER — Ambulatory Visit: Payer: BC Managed Care – PPO | Admitting: Internal Medicine

## 2012-12-26 ENCOUNTER — Other Ambulatory Visit: Payer: Self-pay | Admitting: Internal Medicine

## 2013-01-08 ENCOUNTER — Ambulatory Visit: Payer: BC Managed Care – PPO | Admitting: Internal Medicine

## 2013-01-15 ENCOUNTER — Other Ambulatory Visit: Payer: Self-pay | Admitting: *Deleted

## 2013-01-15 MED ORDER — ESOMEPRAZOLE MAGNESIUM 40 MG PO CPDR: 40.0000 mg | DELAYED_RELEASE_CAPSULE | Freq: Every day | ORAL | Status: DC

## 2013-01-16 ENCOUNTER — Other Ambulatory Visit: Payer: Self-pay | Admitting: Internal Medicine

## 2013-01-24 ENCOUNTER — Other Ambulatory Visit: Payer: Self-pay | Admitting: *Deleted

## 2013-01-24 MED ORDER — AMLODIPINE BESYLATE 5 MG PO TABS: ORAL_TABLET | ORAL | Status: DC

## 2013-01-25 ENCOUNTER — Other Ambulatory Visit: Payer: Self-pay | Admitting: *Deleted

## 2013-01-25 MED ORDER — VALACYCLOVIR HCL 1 G PO TABS: ORAL_TABLET | ORAL | Status: DC

## 2013-02-26 ENCOUNTER — Other Ambulatory Visit: Payer: Self-pay | Admitting: Internal Medicine

## 2013-03-01 ENCOUNTER — Telehealth: Payer: Self-pay | Admitting: Internal Medicine

## 2013-03-01 NOTE — Telephone Encounter (Signed)
Patient Information:  Caller Name: Dorinda Hill  Phone: [phone]  Patient: Tyler, [name]  Gender: Male  DOB: 1950-11-19  Age: 61 Years  PCP: Darryll Capers (Adults only)  Office Follow Up:  Does the office need to follow up with this patient?: Yes  Instructions For The Office: Please call ASAP regarding work in appointment vs go to ED.  RN Note:  Noted burning of face, like sunburn, and itch around notstrils within 2 hours after took first dose of Diflucan 02/26/13.  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  No breathing difficulty, no signs of analyphlaxis. Feels thrush is responding.  Please call back ASAP to advise if can be worked in for office apptointment or if must go to ED/UC.  Symptoms  Reason For Call & Symptoms: Burning, mildy itchy, blotchy rash (hives) on face, chest and back.  Rash began 2 hours after took 1st Diflucan dose.  Gets thrush from using [facility] inhaler.  Also concerned about recurrent cold sore which literature says may occur with use of Diflucan.  Reviewed Health History In EMR: Yes  Reviewed Medications In EMR: Yes  Reviewed Allergies In EMR: Yes  Reviewed Surgeries / Procedures: Yes  Date of Onset of Symptoms: 02/26/2013  Guideline(s) Used:  Hives  Rash - Widespread on Drugs - Drug Reaction  Disposition Per Guideline:   Go to ED Now (or to Office with PCP Approval)  Reason For Disposition Reached:   Widespread hives and onset < 2 hours of exposure to 1st dose of drug  Advice Given:  N/A  RN Overrode Recommendation:  Follow Up With Office Later  MD orders require office approval  for appointment at office.  Please call back.

## 2013-03-01 NOTE — Telephone Encounter (Signed)
Talked with pt and offered him an appointment for tomorrow, but decided to stop diflucan.and will take benadryl. Will call back on monday

## 2013-03-26 ENCOUNTER — Encounter: Payer: Self-pay | Admitting: Internal Medicine

## 2013-03-26 ENCOUNTER — Ambulatory Visit (INDEPENDENT_AMBULATORY_CARE_PROVIDER_SITE_OTHER): Payer: BC Managed Care – PPO | Admitting: Internal Medicine

## 2013-03-26 VITALS — BP 120/80 | HR 72 | Temp 98.6°F | Resp 16 | Ht 71.0 in | Wt 164.0 lb

## 2013-03-26 DIAGNOSIS — J45909 Unspecified asthma, uncomplicated: Secondary | ICD-10-CM

## 2013-03-26 DIAGNOSIS — B0052 Herpesviral keratitis: Secondary | ICD-10-CM

## 2013-03-26 DIAGNOSIS — Z23 Encounter for immunization: Secondary | ICD-10-CM

## 2013-03-26 DIAGNOSIS — K219 Gastro-esophageal reflux disease without esophagitis: Secondary | ICD-10-CM

## 2013-03-26 DIAGNOSIS — B370 Candidal stomatitis: Secondary | ICD-10-CM

## 2013-03-26 DIAGNOSIS — I10 Essential (primary) hypertension: Secondary | ICD-10-CM

## 2013-03-26 DIAGNOSIS — J329 Chronic sinusitis, unspecified: Secondary | ICD-10-CM

## 2013-03-26 MED ORDER — VALACYCLOVIR HCL 500 MG PO TABS: 500.0000 mg | ORAL_TABLET | Freq: Two times a day (BID) | ORAL | Status: DC

## 2013-03-26 MED ORDER — NYSTATIN 100000 UNIT/ML MT SUSP: 500000.0000 [IU] | OROMUCOSAL | Status: DC

## 2013-03-26 MED ORDER — VALACYCLOVIR HCL 1 G PO TABS: ORAL_TABLET | ORAL | Status: DC

## 2013-03-26 NOTE — Patient Instructions (Signed)
Suppression therapy for 6 months

## 2013-03-26 NOTE — Progress Notes (Signed)
  Subjective:    Patient ID: Tyler [name], male    DOB: 02-19-1951, 62 y.o.   MRN: [id number]  Hypertension  Gastrophageal Reflux   Stable asthma but has recurrent yeast with the  inhailer inhaler works for asthma [name] and he gets vocal inflamation  New labial HSV with two recent outbreaks discussion of preventions   Review of Systems  Constitutional: Negative.   HENT:       White tongue  Cardiovascular: Negative.   Gastrointestinal: Negative.   Musculoskeletal: Negative.   Skin: Negative.   Psychiatric/Behavioral: Negative.        Objective:   Physical Exam  Nursing note and vitals reviewed. Constitutional: He is oriented to person, place, and time. He appears well-developed and well-nourished.  HENT:  Head: Normocephalic and atraumatic.  Eyes: Conjunctivae are normal. Pupils are equal, round, and reactive to light.  Neck: Normal range of motion. Neck supple.  Pulmonary/Chest:  No wheezing  Abdominal: Soft. Bowel sounds are normal.  Neurological: He is alert and oriented to person, place, and time.          Assessment & Plan:  Continue the supression with the valtrex and PRN treat the oral lesion with topical She has developed allergic reaction to Diflucan we will use nystatin swish and swallow I recommended he use it as prophylaxis at least 3 times a week since he has had frequent yeast infections

## 2013-03-26 NOTE — Addendum Note (Signed)
Addended by: Willy Eddy on: 03/26/2013 02:28 PM   Modules accepted: Orders

## 2013-03-29 ENCOUNTER — Other Ambulatory Visit: Payer: Self-pay | Admitting: *Deleted

## 2013-03-29 ENCOUNTER — Telehealth: Payer: Self-pay | Admitting: *Deleted

## 2013-03-29 NOTE — Telephone Encounter (Signed)
c/oo muscle aches,fever 102,nausea and diarrhea- could it be the tetanus shot he received Monday. n o per dr Lovell Sheehan- sounds like flu.tx sx pt informed

## 2013-03-29 NOTE — Telephone Encounter (Signed)
C/o muscle aches,fever 102, nausea,diarrhea.oer dr Lovell Sheehan, sounds like flu-treat sx and rest

## 2013-04-11 ENCOUNTER — Ambulatory Visit: Payer: Self-pay | Admitting: Internal Medicine

## 2013-06-22 ENCOUNTER — Other Ambulatory Visit: Payer: Self-pay | Admitting: Dermatology

## 2013-09-10 ENCOUNTER — Other Ambulatory Visit (INDEPENDENT_AMBULATORY_CARE_PROVIDER_SITE_OTHER): Payer: BC Managed Care – PPO

## 2013-09-10 ENCOUNTER — Other Ambulatory Visit: Payer: Self-pay | Admitting: Internal Medicine

## 2013-09-10 DIAGNOSIS — Z0000 Encounter for general adult medical examination without abnormal findings: Secondary | ICD-10-CM

## 2013-09-10 LAB — HEPATIC FUNCTION PANEL: Albumin: 4.3 g/dL (ref 3.5–5.2)

## 2013-09-10 LAB — CBC WITH DIFFERENTIAL/PLATELET
Basophils Relative: 0.4 % (ref 0.0–3.0)
Eosinophils Absolute: 0 10*3/uL (ref 0.0–0.7)
Hemoglobin: 14.2 g/dL (ref 13.0–17.0)
Lymphocytes Relative: 33.4 % (ref 12.0–46.0)
MCHC: 34.3 g/dL (ref 30.0–36.0)
MCV: 99.9 fl (ref 78.0–100.0)
Monocytes Absolute: 0.4 10*3/uL (ref 0.1–1.0)
Neutro Abs: 2.7 10*3/uL (ref 1.4–7.7)
RBC: 4.13 Mil/uL — ABNORMAL LOW (ref 4.22–5.81)

## 2013-09-10 LAB — POCT URINALYSIS DIPSTICK
Glucose, UA: NEGATIVE
Leukocytes, UA: NEGATIVE
Nitrite, UA: NEGATIVE
Spec Grav, UA: 1.015
Urobilinogen, UA: 0.2

## 2013-09-10 LAB — PSA: PSA: 0.2 ng/mL (ref 0.10–4.00)

## 2013-09-10 LAB — LIPID PANEL
Cholesterol: 190 mg/dL (ref 0–200)
LDL Cholesterol: 111 mg/dL — ABNORMAL HIGH (ref 0–99)
Total CHOL/HDL Ratio: 3

## 2013-09-10 LAB — TSH: TSH: 1.46 u[IU]/mL (ref 0.35–5.50)

## 2013-09-10 LAB — BASIC METABOLIC PANEL
BUN: 15 mg/dL (ref 6–23)
Chloride: 104 mEq/L (ref 96–112)
Creatinine, Ser: 1.1 mg/dL (ref 0.4–1.5)

## 2013-09-11 LAB — HIV ANTIBODY (ROUTINE TESTING W REFLEX): HIV: NONREACTIVE

## 2013-09-17 ENCOUNTER — Ambulatory Visit (INDEPENDENT_AMBULATORY_CARE_PROVIDER_SITE_OTHER): Payer: BC Managed Care – PPO | Admitting: Internal Medicine

## 2013-09-17 ENCOUNTER — Encounter: Payer: Self-pay | Admitting: Internal Medicine

## 2013-09-17 VITALS — BP 130/82 | HR 72 | Temp 98.2°F | Resp 16 | Ht 71.0 in | Wt 163.0 lb

## 2013-09-17 DIAGNOSIS — K219 Gastro-esophageal reflux disease without esophagitis: Secondary | ICD-10-CM

## 2013-09-17 DIAGNOSIS — E291 Testicular hypofunction: Secondary | ICD-10-CM

## 2013-09-17 DIAGNOSIS — B370 Candidal stomatitis: Secondary | ICD-10-CM

## 2013-09-17 DIAGNOSIS — J45909 Unspecified asthma, uncomplicated: Secondary | ICD-10-CM

## 2013-09-17 DIAGNOSIS — R7989 Other specified abnormal findings of blood chemistry: Secondary | ICD-10-CM

## 2013-09-17 DIAGNOSIS — Z0000 Encounter for general adult medical examination without abnormal findings: Secondary | ICD-10-CM

## 2013-09-17 DIAGNOSIS — Z23 Encounter for immunization: Secondary | ICD-10-CM

## 2013-09-17 DIAGNOSIS — N529 Male erectile dysfunction, unspecified: Secondary | ICD-10-CM

## 2013-09-17 MED ORDER — UMECLIDINIUM-VILANTEROL 62.5-25 MCG/INH IN AEPB: 1.0000 | INHALATION_SPRAY | Freq: Every day | RESPIRATORY_TRACT | Status: DC

## 2013-09-17 MED ORDER — GRISEOFULVIN ULTRAMICROSIZE 250 MG PO TABS: 250.0000 mg | ORAL_TABLET | Freq: Two times a day (BID) | ORAL | Status: DC

## 2013-09-17 MED ORDER — ESOMEPRAZOLE MAGNESIUM 20 MG PO CPDR: 20.0000 mg | DELAYED_RELEASE_CAPSULE | Freq: Every day | ORAL | Status: DC

## 2013-09-17 MED ORDER — TADALAFIL 5 MG PO TABS: 5.0000 mg | ORAL_TABLET | Freq: Every day | ORAL | Status: DC | PRN

## 2013-09-17 NOTE — Progress Notes (Signed)
Subjective:    Patient ID: Tyler [name], male    DOB: Mar 21, 1951, 62 y.o.   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  HENT: Negative for congestion, hearing loss and postnasal drip.   Eyes: Negative for discharge, redness and visual disturbance.  Respiratory: Negative for cough, shortness of breath and wheezing.   Cardiovascular: Negative for leg swelling.  Gastrointestinal: Negative for abdominal pain, constipation and abdominal distention.  Genitourinary: Negative for urgency and frequency.  Musculoskeletal: Negative for arthralgias, joint swelling and neck pain.  Skin: Negative for color change and rash.  Neurological: Negative for weakness and light-headedness.  Hematological: Negative for adenopathy.  Psychiatric/Behavioral: Negative for behavioral problems.   Past Medical History  Diagnosis Date  . External hemorrhoids with other complication 09/18/2008  . ASTHMA 05/11/2007  . GERD 06/28/2007  . Pityriasis rosea 06/28/2007  . FASCIITIS, PLANTAR 05/14/2010  . Thrombocytopenia 12/03/2010  . Arthritis   . Rash     bruises easily per medical history form dated 04/05/11.    History   Social History  . Marital Status: Single    Spouse Name: N/A    Number of Children: N/A  . Years of Education: N/A   Occupational History  . Not on file.   Social History Main Topics  . Smoking status: Former Smoker -- 15 years    Types: Cigarettes    Quit date: 11/15/1988  . Smokeless tobacco: Not on file  . Alcohol Use: 1.0 oz/week    2 drink(s) per week     Comment: 3 oz nightly per history form dated 04/05/11.  . Drug Use: No  . Sexual Activity: Yes   Other Topics Concern  . Not on file   Social History Narrative  . No narrative on file    Past Surgical History  Procedure Laterality Date  . Knee arthroscopy w/  debridement  2006    surgeon Allusio  . Tonsillectomy      at age 72  . Cholecystectomy  03/20/2011  . Appendectomy  03/2011    Family History  Problem Relation Age of Onset  . Drug abuse Sister     Allergies  Allergen Reactions  . Diflucan [Fluconazole]     rash  . Penicillins     REACTION: High fever  . Sulfonamide Derivatives     REACTION: joints ache  . Altabax [Retapamulin] Rash    Current Outpatient Prescriptions on File Prior to Visit  Medication Sig Dispense Refill  . acyclovir ointment (ZOVIRAX) 5 % Apply 6 x daily for 7 days to affected area  30 g  1  . albuterol (PROAIR HFA) 108 (90 BASE) MCG/ACT inhaler Inhale 2 puffs into the lungs 2 (two) times daily as needed.  18 g  3  . amLODipine (NORVASC) 5 MG tablet TAKE 1 TABLET BY MOUTH DAILY  30 tablet  11  . azelastine (ASTELIN) 137 MCG/SPRAY nasal spray 1 spray by Nasal route 2 (two) times daily. Use in each nostril as directed       . bisoprolol (ZEBETA) 5 MG tablet TAKE 1 TABLET BY MOUTH EVERY NIGHT AT BEDTIME  90 tablet  0  . DULERA 100-5 MCG/ACT AERO INHALE 2 PUFFS TWICE DAILY  39 g  0  . hydrocortisone-pramoxine (ANALPRAM-HC) 2.5-1 % rectal cream Place rectally 2 (two)  times daily.  30 g  3  . HYDROMET 5-1.5 MG/5ML syrup TAKE 1 TEASPOONFUL BY MOUTH EVERY 6 HOURS AS NEEDED  180 mL  0  . montelukast (SINGULAIR) 10 MG tablet TAKE 1 TABLET BY MOUTH EVERY NIGHT AT BEDTIME  90 tablet  3  . nystatin (MYCOSTATIN) 100000 UNIT/ML suspension Take 5 mLs (500,000 Units total) by mouth every other day.  480 mL  2  . triamcinolone cream (KENALOG) 0.1 % Apply topically 2 (two) times daily. Use for rash on left leg for 1-2 weeks  30 g  1  . valACYclovir (VALTREX) 1000 MG tablet TAKE 1 TABLET BY MOUTH DAILY  30 tablet  11   No current facility-administered medications on file prior to visit.    BP 130/82  Pulse 72  Temp(Src) 98.2 F (36.8 C)  Resp 16  Ht 5\' 11"  (1.803 m)  Wt 163 lb (73.936 kg)  BMI 22.74 kg/m2        Objective:   Physical Exam  Constitutional: He is oriented to person, place, and time. He appears well-developed and well-nourished.  HENT:  Head: Normocephalic and atraumatic.  Mouth/Throat: Oropharyngeal exudate present.  Eyes: Conjunctivae are normal. Pupils are equal, round, and reactive to light.  Neck: Normal range of motion. Neck supple.  Cardiovascular: Normal rate and regular rhythm.   Pulmonary/Chest: Effort normal and breath sounds normal.  Abdominal: Soft. Bowel sounds are normal.  Genitourinary: Rectum normal and prostate normal.  Musculoskeletal: Normal range of motion.  Neurological: He is alert and oriented to person, place, and time.  Skin: Skin is warm and dry.  Psychiatric: He has a normal mood and affect. His behavior is normal.          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  CPX  Patient presents for yearly preventative medicine examination.   all immunizations and health maintenance protocols were reviewed with the patient and they are up to date with these protocols.   screening laboratory values were reviewed with the patient including screening of hyperlipidemia PSA renal function and hepatic function.   There medications past medical history social history problem list and allergies were reviewed in detail.   Goals were established with regard to weight loss exercise diet in compliance with medications

## 2013-09-19 DIAGNOSIS — R7989 Other specified abnormal findings of blood chemistry: Secondary | ICD-10-CM | POA: Insufficient documentation

## 2013-09-21 ENCOUNTER — Encounter: Payer: Self-pay | Admitting: Internal Medicine

## 2013-09-21 ENCOUNTER — Telehealth: Payer: Self-pay | Admitting: Internal Medicine

## 2013-09-21 NOTE — Telephone Encounter (Signed)
Please disregard. I spoke to pt and he states that he already has the medication.

## 2013-09-21 NOTE — Telephone Encounter (Signed)
As of this year, pt's plan changed and Cialis is no longer covered for Erectile Dysfunction.  It is only covered for BPH.  If you can offer other alternative medication for Erectile Dysfunction, I can call [facility] back to see if it iscovered under the patient's plan.  Please advise.

## 2013-09-21 NOTE — Telephone Encounter (Signed)
New allergy /intolerance to gresiofulvin

## 2013-09-23 ENCOUNTER — Encounter: Payer: Self-pay | Admitting: Internal Medicine

## 2013-09-25 ENCOUNTER — Ambulatory Visit (INDEPENDENT_AMBULATORY_CARE_PROVIDER_SITE_OTHER): Payer: BC Managed Care – PPO | Admitting: *Deleted

## 2013-09-25 DIAGNOSIS — T7840XD Allergy, unspecified, subsequent encounter: Secondary | ICD-10-CM

## 2013-09-25 DIAGNOSIS — J45909 Unspecified asthma, uncomplicated: Secondary | ICD-10-CM

## 2013-09-25 MED ORDER — METHYLPREDNISOLONE ACETATE 80 MG/ML IJ SUSP
80.0000 mg | Freq: Once | INTRAMUSCULAR | Status: AC
  Administered 2013-09-25: 80 mg via INTRAMUSCULAR

## 2013-10-09 ENCOUNTER — Encounter: Payer: Self-pay | Admitting: Internal Medicine

## 2013-10-17 ENCOUNTER — Encounter: Payer: Self-pay | Admitting: Internal Medicine

## 2013-10-29 ENCOUNTER — Encounter: Payer: Self-pay | Admitting: Internal Medicine

## 2013-10-29 ENCOUNTER — Other Ambulatory Visit: Payer: Self-pay | Admitting: *Deleted

## 2013-10-29 MED ORDER — UMECLIDINIUM-VILANTEROL 62.5-25 MCG/INH IN AEPB: 1.0000 | INHALATION_SPRAY | Freq: Every day | RESPIRATORY_TRACT | Status: DC

## 2013-11-02 ENCOUNTER — Encounter: Payer: Self-pay | Admitting: Family

## 2013-11-02 ENCOUNTER — Ambulatory Visit (INDEPENDENT_AMBULATORY_CARE_PROVIDER_SITE_OTHER): Payer: BC Managed Care – PPO | Admitting: Family

## 2013-11-02 DIAGNOSIS — M542 Cervicalgia: Secondary | ICD-10-CM

## 2013-11-02 MED ORDER — CYCLOBENZAPRINE HCL 10 MG PO TABS: 10.0000 mg | ORAL_TABLET | Freq: Three times a day (TID) | ORAL | Status: DC | PRN

## 2013-11-02 MED ORDER — MELOXICAM 15 MG PO TABS: 15.0000 mg | ORAL_TABLET | Freq: Every day | ORAL | Status: DC
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
## 2013-11-12 DIAGNOSIS — Z87891 Personal history of nicotine dependence: Secondary | ICD-10-CM

## 2013-11-12 DIAGNOSIS — J45990 Exercise induced bronchospasm: Secondary | ICD-10-CM | POA: Insufficient documentation

## 2013-11-12 LAB — PULMONARY FUNCTION TEST

## 2013-12-02 ENCOUNTER — Other Ambulatory Visit: Payer: Self-pay | Admitting: Internal Medicine

## 2014-01-15 ENCOUNTER — Other Ambulatory Visit: Payer: Self-pay | Admitting: Internal Medicine

## 2014-01-26 ENCOUNTER — Other Ambulatory Visit: Payer: Self-pay | Admitting: Internal Medicine

## 2014-01-28 ENCOUNTER — Other Ambulatory Visit: Payer: Self-pay | Admitting: Internal Medicine

## 2014-02-14 ENCOUNTER — Encounter: Payer: Self-pay | Admitting: Internal Medicine

## 2014-02-17 ENCOUNTER — Other Ambulatory Visit: Payer: Self-pay | Admitting: Internal Medicine

## 2014-02-18 ENCOUNTER — Encounter: Payer: Self-pay | Admitting: Internal Medicine

## 2014-02-18 ENCOUNTER — Ambulatory Visit (INDEPENDENT_AMBULATORY_CARE_PROVIDER_SITE_OTHER): Payer: BC Managed Care – PPO | Admitting: Internal Medicine

## 2014-02-18 VITALS — BP 144/100 | HR 74 | Temp 97.9°F | Ht 71.0 in | Wt 169.0 lb

## 2014-02-18 DIAGNOSIS — J45909 Unspecified asthma, uncomplicated: Secondary | ICD-10-CM

## 2014-02-18 MED ORDER — AZELASTINE HCL 0.1 % NA SOLN: 2.0000 | Freq: Two times a day (BID) | NASAL | Status: DC

## 2014-02-18 MED ORDER — VALACYCLOVIR HCL 1 G PO TABS: ORAL_TABLET | ORAL | Status: DC

## 2014-02-18 NOTE — Progress Notes (Signed)
No change  Just refills so no visit today

## 2014-02-18 NOTE — Progress Notes (Signed)
Pre visit review using our clinic review tool, if applicable. No additional management support is needed unless otherwise documented below in the visit note. 

## 2014-03-15 ENCOUNTER — Encounter: Payer: Self-pay | Admitting: Internal Medicine

## 2014-03-18 MED ORDER — BISOPROLOL FUMARATE 5 MG PO TABS
5.0000 mg | ORAL_TABLET | Freq: Every day | ORAL | Status: DC
Start: 2014-03-18 — End: 2022-03-03

## 2014-03-31 ENCOUNTER — Encounter: Payer: Self-pay | Admitting: Internal Medicine

## 2014-04-02 ENCOUNTER — Encounter: Payer: Self-pay | Admitting: Internal Medicine

## 2014-04-29 ENCOUNTER — Other Ambulatory Visit: Payer: Self-pay | Admitting: Internal Medicine

## 2014-05-13 LAB — PULMONARY FUNCTION TEST

## 2014-08-15 ENCOUNTER — Encounter: Payer: Self-pay | Admitting: Gastroenterology

## 2014-08-23 ENCOUNTER — Encounter: Payer: Self-pay | Admitting: Gastroenterology

## 2014-08-23 ENCOUNTER — Ambulatory Visit (INDEPENDENT_AMBULATORY_CARE_PROVIDER_SITE_OTHER): Payer: BC Managed Care – PPO | Admitting: Gastroenterology

## 2014-08-23 VITALS — BP 122/72 | HR 84 | Ht 71.0 in | Wt 165.5 lb

## 2014-08-23 DIAGNOSIS — K625 Hemorrhage of anus and rectum: Secondary | ICD-10-CM | POA: Insufficient documentation

## 2014-08-23 DIAGNOSIS — K648 Other hemorrhoids: Secondary | ICD-10-CM

## 2014-08-23 MED ORDER — HYDROCORTISONE ACETATE 25 MG RE SUPP: 25.0000 mg | Freq: Two times a day (BID) | RECTAL | Status: DC

## 2014-08-23 NOTE — Progress Notes (Signed)
08/23/2014 Tyler [name] [id number] [birth year]   HISTORY OF PRESENT ILLNESS:  This is a pleasant 63 year old male who is previously known to Dr. Carlean Purl for colonoscopy in May 2009. At that time he was found to have one descending colon polyp that was removed and was hyperplastic on pathology. He also had mild sigmoid diverticulosis. Prep was excellent. He presents to our office today at the referral of his PCP, Dr. Noah Delaine, for evaluation regarding rectal bleeding. The patient states that about one month ago he noticed what he thought was a hemorrhoid protruding from his rectum. He pushed it back up inside. Then about a week and a half ago he started having bright red rectal bleeding each time he has a bowel movement. He was also passing small amounts of blood when he would pass gas. The blood is described to be on the toilet paper and in the toilet bowl with some dripping from his anus at times. Says that on some occasions it could be about a tablespoon or so of blood. He has never experienced anything similar to this in the past. He denies any pain with bowel movements. His bowel movements are soft and he is not straining to pass his stool. His last bowel movement was this morning and he did have a small amount of bleeding with that. He says that he has not had any anal sex in the past 9 months. Hemoglobin drawn by PCP October 1 was 14.9 g.   Past Medical History  Diagnosis Date  . External hemorrhoids with other complication 81/02/4817  . ASTHMA 05/11/2007  . GERD 06/28/2007  . Pityriasis rosea 06/28/2007  . FASCIITIS, [place] 05/14/2010  . Thrombocytopenia 12/03/2010  . Arthritis   . Rash     bruises easily per medical history form dated 04/05/11.   Past Surgical History  Procedure Laterality Date  . Knee arthroscopy w/ debridement  2006    surgeon Allusio  . Tonsillectomy      at age 61  . Cholecystectomy  03/20/2011  . Appendectomy  03/2011    reports that he quit smoking about  25 years ago. His smoking use included Cigarettes. He smoked 0.00 packs per day for 15 years. He does not have any smokeless tobacco history on file. He reports that he drinks about one ounce of alcohol per week. He reports that he does not use illicit drugs. family history includes Drug abuse in his sister. There is no history of Colon cancer, Colon polyps, Diabetes, Kidney disease, or Esophageal cancer. Allergies  Allergen Reactions  . Diflucan [Fluconazole]     rash  . Penicillins     REACTION: High fever  . Sulfonamide Derivatives     REACTION: joints ache  . Altabax [Retapamulin] Rash      Outpatient Encounter Prescriptions as of 08/23/2014  Medication Sig  . acyclovir ointment (ZOVIRAX) 5 % Apply 6 x daily for 7 days to affected area  . albuterol (PROAIR HFA) 108 (90 BASE) MCG/ACT inhaler Inhale 2 puffs into the lungs 2 (two) times daily as needed.  Marland Kitchen amLODipine (NORVASC) 5 MG tablet TAKE 1 TABLET BY MOUTH DAILY  . azelastine (ASTELIN) 137 MCG/SPRAY nasal spray Place 2 sprays into both nostrils 2 (two) times daily. Use in each nostril as directed  . bisoprolol (ZEBETA) 5 MG tablet Take 1 tablet (5 mg total) by mouth daily.  Marland Kitchen esomeprazole (NEXIUM) 20 MG capsule Take 1 capsule (20 mg total) by mouth daily before breakfast.  .  HYDROMET 5-1.5 MG/5ML syrup TAKE 1 TEASPOONFUL BY MOUTH EVERY 6 HOURS AS NEEDED  . montelukast (SINGULAIR) 10 MG tablet TAKE 1 TABLET BY MOUTH EVERY NIGHT AT BEDTIME  . tadalafil (CIALIS) 5 MG tablet Take 1 tablet (5 mg total) by mouth daily as needed for erectile dysfunction.  . valACYclovir (VALTREX) 1000 MG tablet TAKE 1 TABLET BY MOUTH DAILY  . [DISCONTINUED] triamcinolone cream (KENALOG) 0.1 % Apply topically 2 (two) times daily. Use for rash on left leg for 1-2 weeks  . [DISCONTINUED] Umeclidinium-Vilanterol 62.5-25 MCG/INH AEPB Inhale 1 Inhaler into the lungs daily.  . [DISCONTINUED] Umeclidinium-Vilanterol 62.5-25 MCG/INH AEPB Inhale 1 puff into the lungs  daily.  . [DISCONTINUED] valACYclovir (VALTREX) 1000 MG tablet TAKE 1 TABLET BY MOUTH EVERY DAY  . hydrocortisone (ANUSOL-HC) 25 MG suppository Place 1 suppository (25 mg total) rectally every 12 (twelve) hours.     REVIEW OF SYSTEMS  : All other systems reviewed and negative except where noted in the History of Present Illness.   PHYSICAL EXAM: BP 122/72  Pulse 84  Ht 5\' 11"  (1.803 m)  Wt 165 lb 8 oz (75.07 kg)  BMI 23.09 kg/m2 General: Well developed white male in no acute distress Head: Normocephalic and atraumatic Eyes:  Sclerae anicteric, conjunctiva pink. Ears: Normal auditory acuity  Lungs: Clear throughout to auscultation Heart: Regular rate and rhythm Abdomen: Soft, non-distended.  Normal bowel sounds.  Non-tender. Rectal:  No external hemorrhoids noted.  No masses or tenderness noted on DRE.  Small amount of light brown stool noted on exam glove; was heme negative.  Anoscopy revealed internal hemorrhoids, no active bleeding although one posterior hemorrhoid looked inflamed.   Musculoskeletal: Symmetrical with no gross deformities  Skin: No lesions on visible extremities Extremities: No edema  Neurological: Alert oriented x 4, grossly non-focal Psychological:  Alert and cooperative. Normal mood and affect  ASSESSMENT AND PLAN: -Rectal bleeding:  Internal hemorrhoids noted on exam.  Will treat with hydrocortisone suppositories twice daily for 7 days.  If no improvement in symptoms, worsening of symptoms, or recurrence then he will contact our office.

## 2014-08-23 NOTE — Patient Instructions (Signed)
We have sent the following medications to your pharmacy for you to pick up at your convenience: Anusol Suppositories, place rectally twice daily for ten days   Please follow up as needed or if symptoms reoccur

## 2014-08-28 NOTE — Progress Notes (Signed)
Agree with Ms. Zehr's management.  Carl E. Gessner, MD, FACG  

## 2015-09-16 ENCOUNTER — Encounter: Payer: Self-pay | Admitting: Internal Medicine

## 2015-09-16 ENCOUNTER — Ambulatory Visit (INDEPENDENT_AMBULATORY_CARE_PROVIDER_SITE_OTHER): Payer: BC Managed Care – PPO | Admitting: Internal Medicine

## 2015-09-16 VITALS — BP 120/60 | HR 60 | Ht 71.0 in | Wt 169.0 lb

## 2015-09-16 DIAGNOSIS — K648 Other hemorrhoids: Secondary | ICD-10-CM | POA: Diagnosis not present

## 2015-09-16 DIAGNOSIS — K642 Third degree hemorrhoids: Secondary | ICD-10-CM

## 2015-09-16 NOTE — Patient Instructions (Signed)
We will see you again at your next appointment on November 18th at 10:00AM for a banding.   Today you have been given hemorrhoid banding information to read.    I appreciate the opportunity to care for you. Silvano Rusk, MD, [facility]

## 2015-09-16 NOTE — Progress Notes (Signed)
   Subjective:    Patient ID: Tyler [name], male    DOB: 1951/07/19, 64 y.o.   MRN: [id number] Cc: Rectal mass HPI The patient is here due to concerns of his and PCP about a protrusion from rectum. Occurs mainly ater defecation and is manually reduced. Was seen by [place] for bleeding internal hemorrhoids late 2015 and had improvement w/ HC suppositories. Colonoscopy 2009 - diverticulosis and left-side diminutive hyperplastic polyp. Denies sig straining, constipation or diarrhea.   Medications, allergies, past medical history, past surgical history, family history and social history are reviewed and updated in the EMR.  Review of Systems As above    Objective:   Physical Exam BP 120/60 mmHg  Pulse 60  Ht 5\' 11"  (1.803 m)  Wt 169 lb (76.658 kg)  BMI 23.58 kg/m2 Rectal Chaperone present NL tone, nontender no mas and NL anoderm  Anoscopy was performed with the patient in the left lateral decubitus position while a chaperone was present and revealed Gr 3 LL internal hemorrhoid and Gr 2 RA/RP    Assessment & Plan:  Prolapsed internal hemorrhoids, grade 3  I reviewed risks benefits and indications of hemorrhoid banding He will return 11/18 for first session I appreciate the opportunity to care for him  JG:[id number],BTDHR, MD

## 2015-09-17 ENCOUNTER — Encounter: Payer: Self-pay | Admitting: Internal Medicine

## 2015-09-21 ENCOUNTER — Encounter: Payer: Self-pay | Admitting: Internal Medicine

## 2015-09-22 ENCOUNTER — Encounter: Payer: Self-pay | Admitting: Internal Medicine

## 2015-10-03 ENCOUNTER — Ambulatory Visit (INDEPENDENT_AMBULATORY_CARE_PROVIDER_SITE_OTHER): Payer: BC Managed Care – PPO | Admitting: Internal Medicine

## 2015-10-03 ENCOUNTER — Encounter: Payer: Self-pay | Admitting: Internal Medicine

## 2015-10-03 VITALS — BP 108/64 | HR 78 | Ht 70.0 in | Wt 168.0 lb

## 2015-10-03 DIAGNOSIS — K642 Third degree hemorrhoids: Secondary | ICD-10-CM

## 2015-10-03 DIAGNOSIS — K648 Other hemorrhoids: Secondary | ICD-10-CM | POA: Diagnosis not present

## 2015-10-03 NOTE — Patient Instructions (Signed)
  HEMORRHOID BANDING PROCEDURE    FOLLOW-UP CARE   1. The procedure you have had should have been relatively painless since the banding of the area involved does not have nerve endings and there is no pain sensation.  The rubber band cuts off the blood supply to the hemorrhoid and the band may fall off as soon as 48 hours after the banding (the band may occasionally be seen in the toilet bowl following a bowel movement). You may notice a temporary feeling of fullness in the rectum which should respond adequately to plain Tylenol or Motrin.  2. Following the banding, avoid strenuous exercise that evening and resume full activity the next day.  A sitz bath (soaking in a warm tub) or bidet is soothing, and can be useful for cleansing the area after bowel movements.     3. To avoid constipation, take two tablespoons of natural wheat bran, natural oat bran, flax, Benefiber or any over the counter fiber supplement and increase your water intake to 7-8 glasses daily.    4. Unless you have been prescribed anorectal medication, do not put anything inside your rectum for two weeks: No suppositories, enemas, fingers, etc.  5. Occasionally, you may have more bleeding than usual after the banding procedure.  This is often from the untreated hemorrhoids rather than the treated one.  Don't be concerned if there is a tablespoon or so of blood.  If there is more blood than this, lie flat with your bottom higher than your head and apply an ice pack to the area. If the bleeding does not stop within a half an hour or if you feel faint, call our office at [phone] or go to the emergency room.  6. Problems are not common; however, if there is a substantial amount of bleeding, severe pain, chills, fever or difficulty passing urine (very rare) or other problems, you should call us at [phone] or report to the nearest emergency room.  7. Do not stay seated continuously for more than 2-3 hours for a day or  two after the procedure.  Tighten your buttock muscles 10-15 times every two hours and take 10-15 deep breaths every 1-2 hours.  Do not spend more than a few minutes on the toilet if you cannot empty your bowel; instead re-visit the toilet at a later time.    We will see you at your next visit for additional banding.   I appreciate the opportunity to care for you. Silvano Rusk, MD, [facility]

## 2015-10-03 NOTE — Progress Notes (Signed)
   PROCEDURE NOTE: The patient presents with symptomatic grade 2-3  hemorrhoids, requesting rubber band ligation of his/her hemorrhoidal disease.  All risks, benefits and alternative forms of therapy were described and informed consent was obtained.   The anorectum was pre-medicated with 0.125% NTG and 5% lidocaine The decision was made to band the LL internal hemorrhoid, and the [place] was used to perform band ligation without complication.  Digital anorectal examination was then performed to assure proper positioning of the band, and to adjust the banded tissue as required.  The patient was discharged home without pain or other issues.  Dietary and behavioral recommendations were given and along with follow-up instructions.     The following adjunctive treatments were recommended:    The patient will return in 2-3 weeks for  follow-up and possible additional banding as required. No complications were encountered and the patient tolerated the procedure well.  I appreciate the opportunity to care for this patient. [id number], MD

## 2015-10-03 NOTE — Assessment & Plan Note (Signed)
LL banded

## 2015-10-07 ENCOUNTER — Encounter: Payer: Self-pay | Admitting: Internal Medicine

## 2015-11-03 ENCOUNTER — Ambulatory Visit (INDEPENDENT_AMBULATORY_CARE_PROVIDER_SITE_OTHER): Payer: BC Managed Care – PPO | Admitting: Internal Medicine

## 2015-11-03 ENCOUNTER — Encounter: Payer: Self-pay | Admitting: Internal Medicine

## 2015-11-03 VITALS — BP 110/78 | HR 72 | Ht 70.0 in | Wt 170.0 lb

## 2015-11-03 DIAGNOSIS — K648 Other hemorrhoids: Secondary | ICD-10-CM

## 2015-11-03 DIAGNOSIS — K642 Third degree hemorrhoids: Secondary | ICD-10-CM

## 2015-11-03 NOTE — Patient Instructions (Signed)
HEMORRHOID BANDING PROCEDURE    FOLLOW-UP CARE   1. The procedure you have had should have been relatively painless since the banding of the area involved does not have nerve endings and there is no pain sensation.  The rubber band cuts off the blood supply to the hemorrhoid and the band may fall off as soon as 48 hours after the banding (the band may occasionally be seen in the toilet bowl following a bowel movement). You may notice a temporary feeling of fullness in the rectum which should respond adequately to plain Tylenol or Motrin.  2. Following the banding, avoid strenuous exercise that evening and resume full activity the next day.  A sitz bath (soaking in a warm tub) or bidet is soothing, and can be useful for cleansing the area after bowel movements.     3. To avoid constipation, take two tablespoons of natural wheat bran, natural oat bran, flax, Benefiber or any over the counter fiber supplement and increase your water intake to 7-8 glasses daily.    4. Unless you have been prescribed anorectal medication, do not put anything inside your rectum for two weeks: No suppositories, enemas, fingers, etc.  5. Occasionally, you may have more bleeding than usual after the banding procedure.  This is often from the untreated hemorrhoids rather than the treated one.  Don't be concerned if there is a tablespoon or so of blood.  If there is more blood than this, lie flat with your bottom higher than your head and apply an ice pack to the area. If the bleeding does not stop within a half an hour or if you feel faint, call our office at [phone] or go to the emergency room.  6. Problems are not common; however, if there is a substantial amount of bleeding, severe pain, chills, fever or difficulty passing urine (very rare) or other problems, you should call us at [phone] or report to the nearest emergency room.  7. Do not stay seated continuously for more than 2-3 hours for a day or two  after the procedure.  Tighten your buttock muscles 10-15 times every two hours and take 10-15 deep breaths every 1-2 hours.  Do not spend more than a few minutes on the toilet if you cannot empty your bowel; instead re-visit the toilet at a later time.    Follow up with Dr Gessner as needed.    I appreciate the opportunity to care for you. Carl Gessner, MD, FACG 

## 2015-11-03 NOTE — Assessment & Plan Note (Signed)
RA and RP banded RTC prn

## 2015-11-03 NOTE — Progress Notes (Signed)
   PROCEDURE NOTE: The patient presents with symptomatic grade 2 hemorrhoids, requesting rubber band ligation of his/her hemorrhoidal disease.  All risks, benefits and alternative forms of therapy were described and informed consent was obtained.   The anorectum was pre-medicated with NTG/lidocaine The decision was made to band the RP and RA internal hemorrhoids, and the [place] was used to perform band ligation without complication.  Digital anorectal examination was then performed to assure proper positioning of the band, and to adjust the banded tissue as required.  The patient was discharged home without pain or other issues.  Dietary and behavioral recommendations were given and along with follow-up instructions.       The patient will return in prn for  follow-up and possible additional banding as required. No complications were encountered and the patient tolerated the procedure well.  CC: Thressa Sheller, MD

## 2015-11-14 DIAGNOSIS — H833X2 Noise effects on left inner ear: Secondary | ICD-10-CM | POA: Insufficient documentation

## 2015-11-16 HISTORY — PX: FINGER SURGERY: SHX640

## 2015-11-21 ENCOUNTER — Telehealth: Payer: Self-pay | Admitting: Internal Medicine

## 2015-11-21 ENCOUNTER — Telehealth: Payer: Self-pay

## 2015-11-21 ENCOUNTER — Ambulatory Visit (INDEPENDENT_AMBULATORY_CARE_PROVIDER_SITE_OTHER): Payer: Self-pay | Admitting: Internal Medicine

## 2015-11-21 ENCOUNTER — Encounter: Payer: Self-pay | Admitting: Internal Medicine

## 2015-11-21 ENCOUNTER — Other Ambulatory Visit (INDEPENDENT_AMBULATORY_CARE_PROVIDER_SITE_OTHER): Payer: BC Managed Care – PPO

## 2015-11-21 VITALS — BP 102/60 | HR 72 | Ht 70.0 in | Wt 168.4 lb

## 2015-11-21 DIAGNOSIS — K625 Hemorrhage of anus and rectum: Secondary | ICD-10-CM

## 2015-11-21 DIAGNOSIS — K648 Other hemorrhoids: Secondary | ICD-10-CM

## 2015-11-21 DIAGNOSIS — K642 Third degree hemorrhoids: Secondary | ICD-10-CM

## 2015-11-21 LAB — CBC WITH DIFFERENTIAL/PLATELET
BASOS PCT: 0.3 % (ref 0.0–3.0)
Basophils Absolute: 0 10*3/uL (ref 0.0–0.1)
EOS PCT: 0.1 % (ref 0.0–5.0)
Eosinophils Absolute: 0 10*3/uL (ref 0.0–0.7)
HCT: 41.9 % (ref 39.0–52.0)
Hemoglobin: 14.2 g/dL (ref 13.0–17.0)
Lymphocytes Relative: 17 % (ref 12.0–46.0)
Lymphs Abs: 1.5 10*3/uL (ref 0.7–4.0)
MCHC: 33.8 g/dL (ref 30.0–36.0)
MCV: 98.8 fl (ref 78.0–100.0)
MONO ABS: 0.3 10*3/uL (ref 0.1–1.0)
MONOS PCT: 3.4 % (ref 3.0–12.0)
Neutro Abs: 6.9 10*3/uL (ref 1.4–7.7)
Neutrophils Relative %: 79.2 % — ABNORMAL HIGH (ref 43.0–77.0)
Platelets: 196 10*3/uL (ref 150.0–400.0)
RBC: 4.24 Mil/uL (ref 4.22–5.81)
RDW: 12 % (ref 11.5–15.5)
WBC: 8.7 10*3/uL (ref 4.0–10.5)

## 2015-11-21 NOTE — Progress Notes (Signed)
    PROCEDURE NOTE: The patient presents with rectal bleeding after hemorrhoid banding - last was 2 weeks ago. Having perhaps 1/4 bright red blood with defecation last night and this AM. No bleeding in between and has normal stool with the movement. No pain  The anorectum was pre-medicated with  5% lidocaine. Anoderm and digital exam normal and nontender  Anoscopy shows some persistent Gr 1-2 internal hemorrhoids LL> RP=RA.Scant bright red blood in RA area - applied silver nitrate in that area. No active bleeding seen.  ? Recurrent hemorrhoidal bleeding vs bleeding from banding ulcer(s)  Will observe and if has worsening bleeding could need sigmoidoscopy to evaluate and treat.  He will see me in 6 weeks, sooner prn  I appreciate the opportunity to care for him  [id number], MD

## 2015-11-21 NOTE — Assessment & Plan Note (Signed)
LL and RP banded RTC prn

## 2015-11-21 NOTE — Telephone Encounter (Signed)
Patient had a banding on 11/03/15.  He reports a large amount of pressure yesterday then a large amount of rectal bleeding.  He has had several episodes today of rectal bleeding.  He will come in for a CBC today at 2:15 and then office visit with Dr. Carlean Purl at [facility]

## 2015-11-21 NOTE — Telephone Encounter (Signed)
Barb Merino, RN, CGRN called and left voice mail that Hgb. In normal range.

## 2015-11-21 NOTE — Progress Notes (Signed)
Quick Note:  CBC ok ______ 

## 2015-11-21 NOTE — Patient Instructions (Signed)
   As long as the bleeding is just with a bowel movement and not in between, etc observe things.  If you have more frequent bleeding without bowel movements etc then contact our office (even after hours) or go to the emergency room.  Hope it slows down quickly.  I appreciate the opportunity to care for you. Gatha Mayer, MD, [facility]

## 2015-11-25 ENCOUNTER — Telehealth: Payer: Self-pay | Admitting: Internal Medicine

## 2015-11-25 MED ORDER — DILTIAZEM GEL 2 %: 1.0000 "application " | Freq: Two times a day (BID) | CUTANEOUS | Status: DC

## 2015-11-25 NOTE — Telephone Encounter (Signed)
Having pain described as a tightness in the rectum. Still having bright red blood with stools at times. No knife like pain in the rectum.  No bleeding in between stools.  Also has irritation in the perianal skin - itchy and sore - thinks he is getting a rash. Says sometimes he has gotten thrush after prednisone and was on that recently.  I will Rx diltiazem gel 2% bid - tid He will try clotrimazole OTC to perianal area  He will update me by My Chart or phone in 2-3 d.  If this is not helpful then we will need to go onto a sigmoidoscopy

## 2015-11-25 NOTE — Telephone Encounter (Signed)
Patient reports that he is having "so much discomfort and pain back there".  He is still only having blood with a BM, not independent of stool.  He reports that a little better as far as the bleeding today.  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  If you are going to consider a flex, as discussed in the office last week, he would need to do it unsedated.

## 2015-11-28 ENCOUNTER — Encounter: Payer: Self-pay | Admitting: Internal Medicine

## 2016-01-14 ENCOUNTER — Ambulatory Visit (INDEPENDENT_AMBULATORY_CARE_PROVIDER_SITE_OTHER): Payer: BC Managed Care – PPO | Admitting: Internal Medicine

## 2016-01-14 ENCOUNTER — Encounter: Payer: Self-pay | Admitting: Internal Medicine

## 2016-01-14 VITALS — BP 110/80 | HR 68 | Ht 70.0 in | Wt 167.4 lb

## 2016-01-14 DIAGNOSIS — K648 Other hemorrhoids: Secondary | ICD-10-CM | POA: Diagnosis not present

## 2016-01-14 DIAGNOSIS — K642 Third degree hemorrhoids: Secondary | ICD-10-CM

## 2016-01-14 DIAGNOSIS — L309 Dermatitis, unspecified: Secondary | ICD-10-CM | POA: Diagnosis not present

## 2016-01-14 NOTE — Patient Instructions (Signed)
  Please use the cream you have for 2 weeks straight and then purchase zeasorb jock itch or AF type to use.     I appreciate the opportunity to care for you. Silvano Rusk, MD, [facility]

## 2016-01-14 NOTE — Assessment & Plan Note (Signed)
Clotrimazole OTC x 2 weeks bid then Zeasorb powder

## 2016-01-14 NOTE — Assessment & Plan Note (Signed)
No sxs return prn

## 2016-01-14 NOTE — Progress Notes (Signed)
   Subjective:    Patient ID: Tyler [name], male    DOB: 01/08/1951, 65 y.o.   MRN: [id number] Cc: f/u hemorrhoids HPI Doing well - no bleeding. Pain he had after banding is gone. Clotrimazole OTC helped perianal itching but it returned so he restarted recently. Used diltiazem x 2 weeks and clotrimazole 1 week.  Medications, allergies, past medical history, past surgical history, family history and social history are reviewed and updated in the EMR.  Review of Systems As above    Objective:   Physical Exam BP 110/80 mmHg  Pulse 68  Ht 5\' 10"  (1.778 m)  Wt 167 lb 6.4 oz (75.932 kg)  BMI 24.02 kg/m2 Inspection of perianal area shows mild pink scaly changes in the gl;uteal crease area     Assessment & Plan:  Prolapsed internal hemorrhoids, grade 3 No sxs return prn  Perianal dermatitis Clotrimazole OTC x 2 weeks bid then Zeasorb powder   [id number], MD

## 2016-04-14 ENCOUNTER — Encounter: Payer: Self-pay | Admitting: Internal Medicine

## 2016-04-14 ENCOUNTER — Ambulatory Visit (INDEPENDENT_AMBULATORY_CARE_PROVIDER_SITE_OTHER): Payer: BC Managed Care – PPO | Admitting: Internal Medicine

## 2016-04-14 VITALS — BP 98/60 | HR 68 | Ht 70.0 in | Wt 168.0 lb

## 2016-04-14 DIAGNOSIS — K642 Third degree hemorrhoids: Secondary | ICD-10-CM

## 2016-04-14 DIAGNOSIS — K641 Second degree hemorrhoids: Secondary | ICD-10-CM

## 2016-04-14 NOTE — Patient Instructions (Addendum)
HEMORRHOID BANDING PROCEDURE    FOLLOW-UP CARE   1. The procedure you have had should have been relatively painless since the banding of the area involved does not have nerve endings and there is no pain sensation.  The rubber band cuts off the blood supply to the hemorrhoid and the band may fall off as soon as 48 hours after the banding (the band may occasionally be seen in the toilet bowl following a bowel movement). You may notice a temporary feeling of fullness in the rectum which should respond adequately to plain Tylenol or Motrin.  2. Following the banding, avoid strenuous exercise that evening and resume full activity the next day.  A sitz bath (soaking in a warm tub) or bidet is soothing, and can be useful for cleansing the area after bowel movements.     3. To avoid constipation, take two tablespoons of natural wheat bran, natural oat bran, flax, Benefiber or any over the counter fiber supplement and increase your water intake to 7-8 glasses daily.    4. Unless you have been prescribed anorectal medication, do not put anything inside your rectum for two weeks: No suppositories, enemas, fingers, etc.  5. Occasionally, you may have more bleeding than usual after the banding procedure.  This is often from the untreated hemorrhoids rather than the treated one.  Don't be concerned if there is a tablespoon or so of blood.  If there is more blood than this, lie flat with your bottom higher than your head and apply an ice pack to the area. If the bleeding does not stop within a half an hour or if you feel faint, call our office at [phone] or go to the emergency room.  6. Problems are not common; however, if there is a substantial amount of bleeding, severe pain, chills, fever or difficulty passing urine (very rare) or other problems, you should call us at [phone] or report to the nearest emergency room.  7. Do not stay seated continuously for more than 2-3 hours for a day or two  after the procedure.  Tighten your buttock muscles 10-15 times every two hours and take 10-15 deep breaths every 1-2 hours.  Do not spend more than a few minutes on the toilet if you cannot empty your bowel; instead re-visit the toilet at a later time.  Today we gave you information to read on hemorrhoid stapling.  Follow up with Dr Carlean Purl as needed.    I appreciate the opportunity to care for you. Silvano Rusk, MD, [facility]

## 2016-04-14 NOTE — Assessment & Plan Note (Signed)
LL banded ( 3rd time) If fails this surgical eval ? PPH

## 2016-04-14 NOTE — Progress Notes (Signed)
Patient ID: Tyler [name], male   DOB: [birth year], 65 y.o.   MRN: [id number]  Subjective:   Patient ID: Tyler [name] male   DOB: Oct 20, 1951 65 y.o.   MRN: [id number]  HPI: [name] is a 65 y.o. M with a PMHx of external and internal hemorrhoids presenting for a follow-up. Patient states his rectal bleeding started again 2 weeks ago. He notices large amounts of blood in the toilet after defecation. States the bleeding stopped 3 days ago but he continues to have diarrhea. Also for the past 1 month, he has noticed a "bulge" coming out of his rectum which he has to "push back in". Reports having slight rectal pain/ discomfort. Denies having any abdominal pain, nausea, or vomiting. Denies having any melena or hematochezia.  Denies having any unintentional weightloss.    Past Medical History  Diagnosis Date  . External hemorrhoids with other complication [id number]  . ASTHMA 05/11/2007  . GERD 06/28/2007  . Pityriasis rosea 06/28/2007  . FASCIITIS, [place] 05/14/2010  . Thrombocytopenia ([place]) 12/03/2010  . Arthritis   . Rash     bruises easily per medical history form dated 04/05/11.  [place] Kitchen Hypertension   . CRF (chronic renal failure)   . ED (erectile dysfunction)   . Shingles     at age 49  . Meningitis   . Prolapsed internal hemorrhoids, grade 3 10/30/2012   Current Outpatient Prescriptions  Medication Sig Dispense Refill  . acyclovir ointment (ZOVIRAX) 5 % Apply 6 x daily for 7 days to affected area 30 g 1  . albuterol (PROAIR HFA) 108 (90 BASE) MCG/ACT inhaler Inhale 2 puffs into the lungs 2 (two) times daily as needed. 18 g 3  . amLODipine (NORVASC) 5 MG tablet TAKE 1 TABLET BY MOUTH DAILY 90 tablet 1  . azelastine (ASTELIN) 137 MCG/SPRAY nasal spray Place 2 sprays into both nostrils 2 (two) times daily. Use in each nostril as directed 30 mL 11  . bisoprolol (ZEBETA) 5 MG tablet Take 1 tablet (5 mg total) by mouth daily. 30 tablet 3  . esomeprazole (NEXIUM) 20 MG capsule  Take 1 capsule (20 mg total) by mouth daily before breakfast. 30 capsule 12  . montelukast (SINGULAIR) 10 MG tablet TAKE 1 TABLET BY MOUTH EVERY NIGHT AT BEDTIME 90 tablet 3  . tadalafil (CIALIS) 5 MG tablet Take 1 tablet (5 mg total) by mouth daily as needed for erectile dysfunction. 30 tablet 3  . valACYclovir (VALTREX) 1000 MG tablet TAKE 1 TABLET BY MOUTH DAILY 30 tablet 11   No current facility-administered medications for this visit.   Family History  Problem Relation Age of Onset  . Drug abuse Sister   . Colon cancer Neg Hx   . Colon polyps Neg Hx   . Diabetes Neg Hx   . Kidney disease Neg Hx   . Esophageal cancer Neg Hx   . AAA (abdominal aortic aneurysm) Mother    Social History   Social History  . Marital Status: Single    Spouse Name: N/A  . Number of Children: N/A  . Years of Education: N/A   Occupational History  . Professor [employer]   Social History Main Topics  . Smoking status: Former Smoker -- 15 years    Types: Cigarettes    Quit date: 11/15/1988  . Smokeless tobacco: Never Used  . Alcohol Use: 1.2 oz/week    2 Standard drinks or equivalent per week     Comment: 3 oz nightly per  history form dated 04/05/11.  . Drug Use: No  . Sexual Activity: Yes   Other Topics Concern  . None   Social History Narrative   Review of Systems: Review of Systems  Constitutional: Negative for fever and chills.  HENT: Negative for congestion and sore throat.   Eyes: Negative for blurred vision and pain.  Respiratory: Negative for cough, shortness of breath and wheezing.   Cardiovascular: Negative for chest pain and leg swelling.  Gastrointestinal: Positive for diarrhea. Negative for heartburn, nausea, vomiting, abdominal pain, constipation, blood in stool and melena.  Genitourinary: Negative for dysuria and flank pain.  Musculoskeletal: Negative for myalgias.  Skin: Negative for itching and rash.  Neurological: Negative for sensory change and focal weakness.    Objective:  Physical Exam: Filed Vitals:   04/14/16 1048  BP: 98/60  Pulse: 68  Height: 5\' 10"  (1.778 m)  Weight: 168 lb (76.204 kg)   Physical Exam  Constitutional: He is oriented to person, place, and time. He appears well-developed and well-nourished. No distress.  HENT:  Head: Normocephalic and atraumatic.  Mouth/Throat: Oropharynx is clear and moist.  Eyes: EOM are normal.  Neck: Neck supple. No tracheal deviation present.  Cardiovascular: Normal rate, regular rhythm and intact distal pulses.   Pulmonary/Chest: Effort normal and breath sounds normal. No respiratory distress. He has no wheezes. He has no rales.  Abdominal: Soft. Bowel sounds are normal. He exhibits no distension and no mass. There is no tenderness. There is no guarding.  Musculoskeletal: Normal range of motion. He exhibits no edema.  Neurological: He is alert and oriented to person, place, and time.  Skin: Skin is warm and dry.   Assessment & Plan:   Prolapsed internal hemorrhoids, grade 3

## 2016-05-05 ENCOUNTER — Other Ambulatory Visit: Payer: Self-pay | Admitting: Orthopedic Surgery

## 2016-05-05 DIAGNOSIS — S63411A Traumatic rupture of collateral ligament of left index finger at metacarpophalangeal and interphalangeal joint, initial encounter: Secondary | ICD-10-CM

## 2016-05-14 ENCOUNTER — Ambulatory Visit
Admission: RE | Admit: 2016-05-14 | Discharge: 2016-05-14 | Disposition: A | Discharge status: Home / Self Care | Payer: BC Managed Care – PPO | Source: Ambulatory Visit | Attending: Orthopedic Surgery | Admitting: Orthopedic Surgery

## 2016-05-14 DIAGNOSIS — S63411A Traumatic rupture of collateral ligament of left index finger at metacarpophalangeal and interphalangeal joint, initial encounter: Secondary | ICD-10-CM

## 2016-05-14 MED ORDER — IOPAMIDOL (ISOVUE-M 200) INJECTION 41%
0.5000 mL | Freq: Once | INTRAMUSCULAR | Status: AC
  Administered 2016-05-14: 0.5 mL via INTRA_ARTICULAR

## 2016-09-17 DIAGNOSIS — H93232 Hyperacusis, left ear: Secondary | ICD-10-CM | POA: Insufficient documentation

## 2016-10-28 ENCOUNTER — Encounter: Payer: Self-pay | Admitting: Nurse Practitioner

## 2016-10-28 ENCOUNTER — Ambulatory Visit (INDEPENDENT_AMBULATORY_CARE_PROVIDER_SITE_OTHER): Payer: BC Managed Care – PPO | Admitting: Nurse Practitioner

## 2016-10-28 VITALS — BP 104/80 | HR 80 | Ht 70.0 in | Wt 172.0 lb

## 2016-10-28 DIAGNOSIS — R195 Other fecal abnormalities: Secondary | ICD-10-CM | POA: Diagnosis not present

## 2016-10-28 DIAGNOSIS — K625 Hemorrhage of anus and rectum: Secondary | ICD-10-CM | POA: Diagnosis not present

## 2016-10-28 NOTE — Progress Notes (Signed)
     HPI: Patient is a 65 year old male known to Dr. Carlean Purl. He had a history of hemorrhoidal bleeding for which she is status post banding in May of this year. His last colonoscopy was May 2009 with findings of mild sigmoid diverticulosis. A small descending hyperplastic colon polyp was removed 2009  Since his banding in May patient has not had any rectal bleeding until Monday. He gives a one-year history of loose stools every morning. Typically, this starts with a soft stool and this is followed by an explosion of loose stool. If he has another bowel movement throughout the day it is more formed. Patient's PCP [facility] celiac disease. Stool for C. difficile, culture and O&P were all negative. Per a pharmacist request patient recently tried Imodium which did help solidify his stools. On Monday morning patient passed a lot of blood with his bowel movement. He did the same thing on Tuesday, and yesterday. No abdominal pain or rectal pain. He never strains have a bowel movement. Patient is homosexual but has had anal sex maybe one time in a year.    Past Medical History:  Diagnosis Date  . Allergic rhinitis   . Arthritis   . ASTHMA 05/11/2007  . CRF (chronic renal failure)   . DJD (degenerative joint disease)   . ED (erectile dysfunction)   . External hemorrhoids with other complication [id number]  . FASCIITIS, [place] 05/14/2010  . GERD 06/28/2007  . HSV (herpes simplex virus) infection   . Hypertension   . Meningitis   . Pityriasis rosea 06/28/2007  . Prolapsed internal hemorrhoids, grade 3 10/30/2012  . Rash    bruises easily per medical history form dated 04/05/11.  . Shingles    at age 17  . Thrombocytopenia ([place]) 12/03/2010    Patient's surgical history, family medical history, social history, medications and allergies were all reviewed in Epic    Physical Exam: BP 104/80   Pulse 80   Ht 5\' 10"  (1.778 m)   Wt 172 lb (78 kg)   BMI 24.68 kg/m   GENERAL: well developed white  male in NAD PSYCH: :Pleasant, cooperative, normal affect PULM: Normal respiratory effort, lungs CTA bilaterally, no wheezing ABDOMEN:  soft, nontender, nondistended, no obvious masses, no hepatomegaly,  normal bowel sounds SKIN:  turgor, no lesions seen Musculoskeletal:  Normal muscle tone, normalstrength NEURO: Alert and oriented x 3, no focal neurologic deficits  Anoscopy: Swollen internal hemorrhoids. No blood or stool in vault  ASSESSMENT and PLAN:  87. 65 year old male with recurrent painless rectal bleeding. Occurs only in the mornings with bowel movements. Internal hemorrhoids on exam. He underwent banding of hemorrhoids in May, did well until recurrent bleeding this week. No straining or trauma but patient does have diarrhea every morning which may be causing recurrent hemorrhoidal swelling.   I will speak patient's primary gastroenterologist, Dr. Carlean Purl, to see if he would like to arrange for another banding in the office.   2. Chronic loose stool (just in am). Has normal BMs too so probably IBS. Stool studies and celiac screening by PCP negative.  -Can try an Imodium at bedtime.  Addendum 10/29/16. Spoke with Dr. Carlean Purl. He would like colonoscopy first and then decide on options for what is most likely hemorrhoidal bleeding. Steroidal preparation for hemorrhoids in the interim.    Tye Savoy , NP 10/28/2016, 2:42 PM

## 2016-10-28 NOTE — Patient Instructions (Signed)
Imodium 1-2 tablets at bed time as needed.  We will call you after we discuss your case with Dr Carlean Purl.  If you are age 63 or older, your body mass index should be between 23-30. Your Body mass index is 24.68 kg/m. If this is out of the aforementioned range listed, please consider follow up with your Primary Care Provider.  If you are age 63 or younger, your body mass index should be between 19-25. Your Body mass index is 24.68 kg/m. If this is out of the aformentioned range listed, please consider follow up with your Primary Care Provider.   Thank you for choosing [facility]

## 2016-11-10 NOTE — Progress Notes (Signed)
Patient not scheduled for colonoscopy yet. Needs f/u unless in the works.  I do think appropriate to make sure nothing more serious than IBS and bleeding hemorrhoids occurring.   Gatha Mayer, MD, [facility]

## 2016-11-15 DIAGNOSIS — Z8619 Personal history of other infectious and parasitic diseases: Secondary | ICD-10-CM

## 2016-11-15 HISTORY — DX: Personal history of other infectious and parasitic diseases: Z86.19

## 2016-11-26 DIAGNOSIS — A3790 Whooping cough, unspecified species without pneumonia: Secondary | ICD-10-CM

## 2016-11-26 HISTORY — DX: Whooping cough, unspecified species without pneumonia: A37.90

## 2016-12-07 ENCOUNTER — Encounter: Payer: Self-pay | Admitting: Internal Medicine

## 2016-12-07 ENCOUNTER — Telehealth: Payer: Self-pay

## 2016-12-07 NOTE — Telephone Encounter (Signed)
-----   Message from Gatha Mayer, MD sent at 12/07/2016 10:12 AM EST ----- Regarding: needs colonoscopy appt He needs to get an appt for rectal bleeding Had some correspondence (can see My Chart) but not scheduled yet  Thanx

## 2016-12-07 NOTE — Telephone Encounter (Signed)
Left message for patient to call back  

## 2016-12-07 NOTE — Telephone Encounter (Signed)
Left message for patient to call back to schedule a colonoscopy and pre-visit

## 2016-12-08 NOTE — Telephone Encounter (Signed)
Colonoscopy and pre-visit have been scheduled.

## 2017-01-06 ENCOUNTER — Ambulatory Visit (AMBULATORY_SURGERY_CENTER): Payer: Self-pay | Admitting: *Deleted

## 2017-01-06 ENCOUNTER — Telehealth: Payer: Self-pay | Admitting: *Deleted

## 2017-01-06 VITALS — Ht 71.0 in | Wt 173.4 lb

## 2017-01-06 DIAGNOSIS — K625 Hemorrhage of anus and rectum: Secondary | ICD-10-CM

## 2017-01-06 NOTE — Telephone Encounter (Signed)
Patient was discussed by you and supervisor. Advise.

## 2017-01-06 NOTE — Progress Notes (Signed)
Pt denies allergies to eggs or soy products. Denies difficulty with sedation or anesthesia. Denies any diet or weight loss medications. Denies use of supplemental oxygen.  Emmi instructions refused by patient. 

## 2017-01-07 ENCOUNTER — Encounter: Payer: Self-pay | Admitting: Internal Medicine

## 2017-01-10 NOTE — Telephone Encounter (Signed)
As long as he finishes antibiotics for the whooping cough infection and is recovered ok for the colonoscopy 3/8

## 2017-01-20 ENCOUNTER — Encounter: Payer: BC Managed Care – PPO | Admitting: Internal Medicine

## 2017-03-10 ENCOUNTER — Other Ambulatory Visit: Payer: Self-pay | Admitting: Family Medicine

## 2017-03-10 DIAGNOSIS — N5089 Other specified disorders of the male genital organs: Secondary | ICD-10-CM

## 2017-03-28 ENCOUNTER — Ambulatory Visit
Admission: RE | Admit: 2017-03-28 | Discharge: 2017-03-28 | Disposition: A | Discharge status: Home / Self Care | Payer: BC Managed Care – PPO | Source: Ambulatory Visit | Attending: Family Medicine | Admitting: Family Medicine

## 2017-03-28 DIAGNOSIS — N5089 Other specified disorders of the male genital organs: Secondary | ICD-10-CM

## 2017-04-01 ENCOUNTER — Ambulatory Visit (AMBULATORY_SURGERY_CENTER): Payer: BC Managed Care – PPO | Admitting: Internal Medicine

## 2017-04-01 ENCOUNTER — Encounter: Payer: Self-pay | Admitting: Internal Medicine

## 2017-04-01 VITALS — BP 114/78 | HR 57 | Temp 98.7°F | Resp 16 | Ht 71.0 in | Wt 173.0 lb

## 2017-04-01 DIAGNOSIS — K5730 Diverticulosis of large intestine without perforation or abscess without bleeding: Secondary | ICD-10-CM

## 2017-04-01 DIAGNOSIS — D125 Benign neoplasm of sigmoid colon: Secondary | ICD-10-CM | POA: Diagnosis not present

## 2017-04-01 DIAGNOSIS — K625 Hemorrhage of anus and rectum: Secondary | ICD-10-CM | POA: Diagnosis not present

## 2017-04-01 DIAGNOSIS — K635 Polyp of colon: Secondary | ICD-10-CM

## 2017-04-01 DIAGNOSIS — K648 Other hemorrhoids: Secondary | ICD-10-CM

## 2017-04-01 HISTORY — PX: COLONOSCOPY WITH PROPOFOL: SHX5780

## 2017-04-01 MED ORDER — SODIUM CHLORIDE 0.9 % IV SOLN: 500.0000 mL | INTRAVENOUS | Status: DC

## 2017-04-01 NOTE — Patient Instructions (Addendum)
I found and removed one tiny polyp. Not causing problems but we remove them.  You do have diverticulosis - thickened muscle rings and pouches in the colon wall. Please read the handout about this condition.  There was a prominent hemorrhoid seen and could be banded again - will discuss.   I appreciate the opportunity to care for you. Gatha Mayer, MD, FACG   YOU HAD AN ENDOSCOPIC PROCEDURE TODAY AT [facility]:   Refer to the procedure report that was given to you for any specific questions about what was found during the examination.  If the procedure report does not answer your questions, please call your gastroenterologist to clarify.  If you requested that your care partner not be given the details of your procedure findings, then the procedure report has been included in a sealed envelope for you to review at your convenience later.  YOU SHOULD EXPECT: Some feelings of bloating in the abdomen. Passage of more gas than usual.  Walking can help get rid of the air that was put into your GI tract during the procedure and reduce the bloating. If you had a lower endoscopy (such as a colonoscopy or flexible sigmoidoscopy) you may notice spotting of blood in your stool or on the toilet paper. If you underwent a bowel prep for your procedure, you may not have a normal bowel movement for a few days.  Please Note:  You might notice some irritation and congestion in your nose or some drainage.  This is from the oxygen used during your procedure.  There is no need for concern and it should clear up in a day or so.  SYMPTOMS TO REPORT IMMEDIATELY:   Following lower endoscopy (colonoscopy or flexible sigmoidoscopy):  Excessive amounts of blood in the stool  Significant tenderness or worsening of abdominal pains  Swelling of the abdomen that is new, acute  Fever of 100F or higher   For urgent or emergent issues, a gastroenterologist can be reached at any hour by calling  [phone].   DIET:  We do recommend a small meal at first, but then you may proceed to your regular diet.  Drink plenty of fluids but you should avoid alcoholic beverages for 24 hours.  ACTIVITY:  You should plan to take it easy for the rest of today and you should NOT DRIVE or use heavy machinery until tomorrow (because of the sedation medicines used during the test).    FOLLOW UP: Our staff will call the number listed on your records the next business day following your procedure to check on you and address any questions or concerns that you may have regarding the information given to you following your procedure. If we do not reach you, we will leave a message.  However, if you are feeling well and you are not experiencing any problems, there is no need to return our call.  We will assume that you have returned to your regular daily activities without incident.  If any biopsies were taken you will be contacted by phone or by letter within the next 1-3 weeks.  Please call us at [phone] if you have not heard about the biopsies in 3 weeks.    SIGNATURES/CONFIDENTIALITY: You and/or your care partner have signed paperwork which will be entered into your electronic medical record.  These signatures attest to the fact that that the information above on your After Visit Summary has been reviewed and is understood.  Full responsibility  of the confidentiality of this discharge information lies with you and/or your care-partner.  Polyp,diverticulosis, and hemorrhoid information given.

## 2017-04-01 NOTE — Op Note (Signed)
[facility] Patient Name: Tyler [name] Procedure Date: 04/01/2017 10:56 AM MRN: [id number] Endoscopist: Gatha Mayer , MD Age: 66 Referring MD:  Date of Birth: [birth year] Gender: Male Account #: [id number] Procedure:                Colonoscopy Indications:              Rectal bleeding Medicines:                Propofol per Anesthesia, Monitored Anesthesia Care Procedure:                Pre-Anesthesia Assessment:                           - Prior to the procedure, a History and Physical                            was performed, and patient medications and                            allergies were reviewed. The patient's tolerance of                            previous anesthesia was also reviewed. The risks                            and benefits of the procedure and the sedation                            options and risks were discussed with the patient.                            All questions were answered, and informed consent                            was obtained. Prior Anticoagulants: The patient has                            taken no previous anticoagulant or antiplatelet                            agents. ASA Grade Assessment: II - A patient with                            mild systemic disease. After reviewing the risks                            and benefits, the patient was deemed in                            satisfactory condition to undergo the procedure.                           After obtaining informed consent, the colonoscope  was passed under direct vision. Throughout the                            procedure, the patient's blood pressure, pulse, and                            oxygen saturations were monitored continuously. The                            Colonoscope was introduced through the anus and                            advanced to the the cecum, identified by                            appendiceal orifice and  ileocecal valve. The                            colonoscopy was performed without difficulty. The                            patient tolerated the procedure well. The quality                            of the bowel preparation was good. The ileocecal                            valve and the appendiceal orifice were                            photographed. The bowel preparation used was                            Miralax. Scope In: 11:10:16 AM Scope Out: 11:21:46 AM Scope Withdrawal Time: 0 hours 8 minutes 46 seconds  Total Procedure Duration: 0 hours 11 minutes 30 seconds  Findings:                 The perianal and digital rectal examinations were                            normal. Pertinent negatives include normal prostate                            (size, shape, and consistency).                           A 4 mm polyp was found in the sigmoid colon. The                            polyp was sessile. The polyp was removed with a                            cold snare. Resection and retrieval were complete.  Verification of patient identification for the                            specimen was done. Estimated blood loss was minimal.                           Multiple large-mouthed diverticula were found in                            the sigmoid colon.                           Internal hemorrhoids were found during retroflexion.                           The exam was otherwise without abnormality on                            direct and retroflexion views. Complications:            No immediate complications. Estimated Blood Loss:     Estimated blood loss was minimal. Impression:               - One 4 mm polyp in the sigmoid colon, removed with                            a cold snare. Resected and retrieved.                           - Diverticulosis in the sigmoid colon.                           - Internal hemorrhoids.                           - The  examination was otherwise normal on direct                            and retroflexion views. Recommendation:           - Patient has a contact number available for                            emergencies. The signs and symptoms of potential                            delayed complications were discussed with the                            patient. Return to normal activities tomorrow.                            Written discharge instructions were provided to the                            patient.                           -  Resume previous diet.                           - Continue present medications.                           - Repeat colonoscopy is recommended. The                            colonoscopy date will be determined after pathology                            results from today's exam become available for                            review.                           - will discuss possible repeat banding - looks like                            right anterior pile was most prominent Gatha Mayer, MD 04/01/2017 11:29:07 AM This report has been signed electronically.

## 2017-04-01 NOTE — Progress Notes (Signed)
Pt's states no medical or surgical changes since previsit or office visit. 

## 2017-04-01 NOTE — Progress Notes (Signed)
Report to PACU, RN, vss, BBS= Clear.  

## 2017-04-01 NOTE — Progress Notes (Signed)
Called to room to assist during endoscopic procedure.  Patient ID and intended procedure confirmed with present staff. Received instructions for my participation in the procedure from the performing physician.  

## 2017-04-04 ENCOUNTER — Telehealth: Payer: Self-pay | Admitting: *Deleted

## 2017-04-04 ENCOUNTER — Telehealth: Payer: Self-pay

## 2017-04-04 NOTE — Telephone Encounter (Signed)
-----   Message from Gatha Mayer, MD sent at 04/01/2017  1:40 PM EDT ----- Regarding: another banding appt! Please call him next week and arrange a repeat banding appt should try to do this month or first part of June please ok to squeeze in

## 2017-04-04 NOTE — Telephone Encounter (Signed)
Spoke with [name] and added him to 04/08/17 schedule at 2:15pm

## 2017-04-04 NOTE — Telephone Encounter (Signed)
  Follow up Call-  Call back number 04/01/2017  Post procedure Call Back phone  # [phone]  Permission to leave phone message Yes  Some recent data might be hidden     Patient questions:  Do you have a fever, pain , or abdominal swelling? No. Pain Score  0 *  Have you tolerated food without any problems? Yes.    Have you been able to return to your normal activities? Yes.    Do you have any questions about your discharge instructions: Diet   No. Medications  No. Follow up visit  No.  Do you have questions or concerns about your Care? No.  Actions: * If pain score is 4 or above: No action needed, pain <4.

## 2017-04-07 ENCOUNTER — Encounter: Payer: Self-pay | Admitting: Internal Medicine

## 2017-04-07 DIAGNOSIS — Z860101 Personal history of adenomatous and serrated colon polyps: Secondary | ICD-10-CM | POA: Insufficient documentation

## 2017-04-07 DIAGNOSIS — Z86010 Personal history of colonic polyps: Secondary | ICD-10-CM

## 2017-04-07 HISTORY — DX: Personal history of colonic polyps: Z86.010

## 2017-04-07 HISTORY — DX: Personal history of adenomatous and serrated colon polyps: Z86.0101

## 2017-04-07 NOTE — Progress Notes (Signed)
Diminutive adenoma recall colon 2023 My Chart Letter

## 2017-04-08 ENCOUNTER — Encounter: Payer: Self-pay | Admitting: Internal Medicine

## 2017-04-08 ENCOUNTER — Ambulatory Visit (INDEPENDENT_AMBULATORY_CARE_PROVIDER_SITE_OTHER): Payer: BC Managed Care – PPO | Admitting: Internal Medicine

## 2017-04-08 DIAGNOSIS — K642 Third degree hemorrhoids: Secondary | ICD-10-CM

## 2017-04-08 NOTE — Assessment & Plan Note (Signed)
RARP banded

## 2017-04-08 NOTE — Patient Instructions (Addendum)
If you are age 64 or older, your body mass index should be between 23-30. Your Body mass index is 23.61 kg/m. If this is out of the aforementioned range listed, please consider follow up with your Primary Care Provider.  If you are age 70 or younger, your body mass index should be between 19-25. Your Body mass index is 23.61 kg/m. If this is out of the aformentioned range listed, please consider follow up with your Primary Care Provider.   HEMORRHOID BANDING PROCEDURE    FOLLOW-UP CARE   1. The procedure you have had should have been relatively painless since the banding of the area involved does not have nerve endings and there is no pain sensation.  The rubber band cuts off the blood supply to the hemorrhoid and the band may fall off as soon as 48 hours after the banding (the band may occasionally be seen in the toilet bowl following a bowel movement). You may notice a temporary feeling of fullness in the rectum which should respond adequately to plain Tylenol or Motrin.  2. Following the banding, avoid strenuous exercise that evening and resume full activity the next day.  A sitz bath (soaking in a warm tub) or bidet is soothing, and can be useful for cleansing the area after bowel movements.     3. To avoid constipation, take two tablespoons of natural wheat bran, natural oat bran, flax, Benefiber or any over the counter fiber supplement and increase your water intake to 7-8 glasses daily.    4. Unless you have been prescribed anorectal medication, do not put anything inside your rectum for two weeks: No suppositories, enemas, fingers, etc.  5. Occasionally, you may have more bleeding than usual after the banding procedure.  This is often from the untreated hemorrhoids rather than the treated one.  Don't be concerned if there is a tablespoon or so of blood.  If there is more blood than this, lie flat with your bottom higher than your head and apply an ice pack to the area. If the bleeding  does not stop within a half an hour or if you feel faint, call our office at [phone] or go to the emergency room.  6. Problems are not common; however, if there is a substantial amount of bleeding, severe pain, chills, fever or difficulty passing urine (very rare) or other problems, you should call us at [phone] or report to the nearest emergency room.  7. Do not stay seated continuously for more than 2-3 hours for a day or two after the procedure.  Tighten your buttock muscles 10-15 times every two hours and take 10-15 deep breaths every 1-2 hours.  Do not spend more than a few minutes on the toilet if you cannot empty your bowel; instead re-visit the toilet at a later time.     Please follow up as needed.  Thank you.

## 2017-04-08 NOTE — Progress Notes (Signed)
    Hemorrhoidal ligation and anoscopy   The patient presents after colonoscopy. He is previously had hemorrhoidal banding and had persistent rectal bleeding. The bleeding actually has resolved, I prominent right posterior hemorrhoid was seen on colonoscopy and he has complained of persistent prolapse symptoms even though the bleeding is better. He has had previous hemorrhoid banding. Improved but some persistent symptoms.  Anoscopy was performed with the patient in the left lateral decubitus position while a chaperone was present and revealed Gr 2 RP and Gr 1 RA internal hemorrhoids and a grade 1 left lateral internal hemorrhoid.  PROCEDURE NOTE: The patient presents with symptomatic grade 2  hemorrhoids, requesting rubber band ligation of his/her hemorrhoidal disease.  All risks, benefits and alternative forms of therapy were described and informed consent was obtained.   The anorectum was pre-medicated with 0.125% nitroglycerin and 5% topical lidocaine The decision was made to band the right posterior and right anterior internal hemorrhoids, and the [place] was used to perform band ligation without complication.  Digital anorectal examination was then performed to assure proper positioning of the band, and to adjust the banded tissue as required.  The patient was discharged home without pain or other issues.  Dietary and behavioral recommendations were given and along with follow-up instructions.      The patient will return as needed for  follow-up and possible additional banding as required. No complications were encountered and the patient tolerated the procedure well.   I appreciate the opportunity to care for this patient. CC: Cari Caraway, MD

## 2018-01-13 DIAGNOSIS — M170 Bilateral primary osteoarthritis of knee: Secondary | ICD-10-CM | POA: Insufficient documentation

## 2018-01-24 ENCOUNTER — Telehealth: Payer: Self-pay | Admitting: Internal Medicine

## 2018-01-24 NOTE — Telephone Encounter (Signed)
Patient states he is having some rectal pain and thinks he needs another hem banding. Patient wanting to know if he can sch hem band or if he needs ov first. Last hem banding was 03-2017.

## 2018-01-24 NOTE — Telephone Encounter (Signed)
Patient is having rectal pain and is wondering if he can have another banding.  He will come in on 02/06/18 to discuss

## 2018-02-06 ENCOUNTER — Encounter: Payer: BC Managed Care – PPO | Admitting: Internal Medicine

## 2018-02-06 ENCOUNTER — Telehealth: Payer: Self-pay | Admitting: Internal Medicine

## 2018-02-06 NOTE — Telephone Encounter (Signed)
No charge. 

## 2018-02-27 ENCOUNTER — Encounter: Payer: BC Managed Care – PPO | Admitting: Internal Medicine

## 2018-03-30 ENCOUNTER — Ambulatory Visit: Payer: Self-pay | Admitting: General Surgery

## 2018-04-05 ENCOUNTER — Encounter: Payer: BC Managed Care – PPO | Admitting: Internal Medicine

## 2018-04-20 ENCOUNTER — Ambulatory Visit: Payer: BC Managed Care – PPO | Admitting: Internal Medicine

## 2018-04-20 ENCOUNTER — Encounter: Payer: Self-pay | Admitting: Internal Medicine

## 2018-04-20 DIAGNOSIS — K642 Third degree hemorrhoids: Secondary | ICD-10-CM

## 2018-04-20 DIAGNOSIS — L290 Pruritus ani: Secondary | ICD-10-CM | POA: Diagnosis not present

## 2018-04-20 NOTE — Patient Instructions (Addendum)
Hope this works but might need another banding. I'll save the green band just for that!  Try Calmoseptine or the Recticare for itching (or both).  Read the information below also.  See you in August but call back sooner as needed.   PRURITUS ANI   OVERVIEW  Pruritus ani is a common medical problem affecting both men and women. This information was composed to help patients understand pruritus ani, its symptoms, evaluation, and treatment options.  This information may also be helpful to individuals or caregivers of patients who are suffering from pruritus ani.  Pruritus ani most commonly affects adults, affecting from 1% to 5% of people in the general population. Men are more commonly affected than women with a 4:1 ratio. The condition is most common in people age 41s to 10s.  There are many causes of pruritis ani, and an accurate diagnosis is important in order to treat the specific cause.  Medical management of pruritis ani often provides patients with relief of their symptoms and improves their quality of life.  WHAT IS PRURITUS ANI?  Pruritus ani is a Latin term meaning "itchy anus" and is defined as an unpleasant sensation of the skin around the anus (i.e., rectal opening) that produces the desire to scratch. Pruritus ani is classified as primary or secondary. The primary form is the classic syndrome which may not have an identifiable cause (referred to as "idiopathic") and the secondary form has an identifiable, and often specifically treatable, cause.  Minimal stimulation of the skin may cause itching. The subsequent scratching may cause injury to the skin which produces a larger area of irritated skin. Continued scratching causes the need to scratch more, making the problem worse.  WHAT CAUSES PRURITUS ANI?  This symptom of pruritus or itching is common to many anorectal conditions.  One must consider hemorrhoids, excessive skin tags, fecal soilage or incontinence, anal  fistulae (abnormal passageways between the bowel and an organ or skin surface), anal fissures (painful clefts or grooves) and anal warts as possible causative agents.  It is not always understood what causes the long-standing history of primary pruritis ani. It is believed that an irritating secretion from the anal canal may cause the itching. The local nerve fibers in the skin may become chronically active with repetitive trauma or scratching for prolonged periods of time. There can also be itching related to disorders of nerve pathways or itching related to a central nervous system stimulus such as medications. Occasionally, itching may also be psychogenic (symptoms arise from the mind, as opposed to another organ).  Other potential causes of irritation include moisture from sweat, stool and mucus.  Studies have shown that the relief of symptoms can occur promptly after the stool has been cleansed from the perianal area, indicating that stool is likely an irritant causing of itching. In addition to difficult or inadequate hygiene, overzealous or aggressive hygiene with the use of many irritating soaps, scents, and lotions may cause pruritis ani, resulting in this condition occasionally being referred to as "polished anus syndrome."  Overzealous cleaning, in addition to the use of topical steroids, can destroy natural skin barriers and cause trauma to the anal skin, making the problem worse.  In a way, trying to keep it "too clean" may worsen the problem.  Dietary factors may also play a role with pruritis ani, although there are not definitive studies implicating particular food items or diets.  Coffee, either caffeinated or decaffeinated, is thought to be a major contributing  factor. Coffee consumption may lower the anal resting pressure (normal strength of muscle contraction at rest) and contribute to anal leakage of stool. Other dietary agents which are possible causes of pruritis ani include tea, cola,  energy drinks, chocolate, citrus fruits, tomatoes, spicy foods, beer, dairy products and nuts.  Infectious processes may also result in pruritus ani. Examples include bacterial skin infections, fungal infection (although a common fungus, Candida albicans, appears to be a normal inhabitant of the perianal skin), parasitic infections with pinworms or scabies, and viral infections with anal warts.  NO EVIDENCE OF THESE  Numerous skin conditions also cause secondary pruritis ani. Conditions that are potential causes of pruritus ani include psoriasis, seborrheic dermatitis, atopic dermatitis, contact dermatitis, lichen planus, lichen simplex, and lichen sclerosis. Local cancers such as Bowen's disease or extra mammary Paget's disease are also potential causes of pruritis ani. NO EVIDENCE OF THESE  Medical diseases that affect the entire body may also cause pruritus ani. The examples include diabetes mellitus, leukemia and lymphoma, kidney failure, liver diseases (obstructive jaundice), iron deficiency anemia, or hyperthyroidism.  NOT AN ISSUE  While this is a wide variety of potential causes, it is important to understand that in many cases the itching has no identifiable source.  PATIENT EVALUATION  A careful medical history must be obtained from the patient focusing on the timing and duration of the pruritus ani as well as any accompanying symptoms.  Toileting behaviors must be evaluated, including the frequency and quality of stools, possible stool or mucus leakage, perianal moisture sensation, or the sensation of incomplete evacuation of stool. In addition, hygiene rituals or cleansing methods after a bowel movement must be evaluated.  Travel history and current medications, including all topical agents used, must be reviewed. A detailed diet history should be taken with close attention to known dietary agents suspected of causing pruritus ani. Often a heavy consumption of caffeine with coffee, tea,  cola, or energy drinks is present. Lastly, investigation regarding possible infectious agents, such as pinworms in children, should also be considered. Adults rarely harbor pinworms. (Doubt)  Your doctor will perform an office physical examination to provide information regarding a possible cause of the symptoms. Examination should include a thorough inspection of the skin around the anus. The skin may appear normal or may have findings such as open wounds or cracks, redness, or possible characteristic changes of thickened skin  due to repeated scratching and irritation.  Primary or idiopathic pruritus ani is classified by a staging system used at [facility], and is based on the physical features of the skin. Stage 0 is normal skin, stage 1 is red and inflamed skin (you), stage 2 has thickened skin, and stage 3 has thickened skin, coarse ridges, and often ulcerations.   TREATMENT OF PRURITUS ANI (Let's see what the banding and Calmoseptine or Recticare do)  The goal of therapy is to restore clean, dry, and intact skin. Treatment can be challenging, as many cases have no clear identifiable cause.  It is important to use bowel medications to thicken stool and create a formed bowel movement to minimize leakage or seepage and also to allow for complete evacuation.  The goal is a soft, bulky, easy to clean stool. Most people can benefit from taking a fiber supplement (Citrucel, Metamucil, Fibercon, Benefiber, and Konsyl are examples). This can be taken in powder or capsule/tablet form and is usually taken once or twice daily. The fiber serves to absorb the moisture from the stool, adding bulk  and allowing for complete evacuation of stool during bowel movements.  If stools still remain loose, additional medications may be helpful. Imodium is an antidiarrheal medication which can thicken or firm stool and help decrease seepage. In more difficult cases, prescription medications such as  Lomotil may be needed to thicken the stool. Your physician can help decide which medications may be best for you.  Dietary changes are often necessary for treatment.  There are several common foods which may be related to pruritus ani.  These foods and beverages include coffee, colas, tea, chocolate, tomatoes and beer.  These items may possibly decrease your sphincter tone which can cause some seepage or leakage.  Avoiding overuse of these items may improve symptoms.  It may be helpful to remove one item at a time from your diet for several weeks.  If your symptoms improve, you could try reintroduction of the item in smaller volume and see if there is a limit to which you may have that item without producing symptoms.  It will also be important to modify bowel hygiene or cleaning habits. It must be stressed that the anus does not need to be scrubbed or sterilized. Cleaning with plain water rinses is quite helpful. Soaps, perfumes, dyes in tissue or clothing, and baby wipes containing deodorants should be avoided because they can act as irritants. Alcohol and witch hazel agents should similarly be avoided. Bathing with Dove soap is recommended, as it is free of conventional soap. Also, handheld detachable shower heads can be used to clean and wash away any remaining soap residue.  The same effect can also be created with a bidet, although they are not common in the [place]  Balneol is a gentle and soothing cleaning agent.  It is commercially available mineral oil-based preparation that can be used at home or taken along in a pocket or a purse for use in public facilities. Another possible cleaning agent is dilute white vinegar. One tablespoon in an 8 ounce glass of water can be kept in the bathroom and applied with a cotton ball. Burrow's solution, diluted at 1:40 (one [place] tablet in 12 ounces of water or one tablet in six ounces of water for 1:20 solution) is also a gentle and non-irritating cleanser. It  can be kept in a plastic squeeze bottle in the refrigerator and used in place of soap and water.  The ultimate goal of treatment is to create dry, healthy, and intact skin. The skin can be dried after cleansing using a hair dryer on low setting. An athlete's foot powder or Zeasorb, a lubricating and drying agent in powder form can also be used to absorb moisture. After drying, the athlete's foot powder or Zeasorb can be applied, and a small piece of cotton can be placed between the buttocks and against the anus to help absorb the moisture. Tight fitting, synthetic undergarments should be avoided.  One of the most important, but often most difficult, aspects of the management of pruritis ani, is to avoid trauma to the skin. This means no scratching with hands or dry toilet paper. Behavioral modification is often very difficult to achieve, due to the intense desire to scratch. Many people also scratch during sleep and are not aware of it until they wake to find themselves scratching. It is often recommended to have patients cut their nails and wear a pair of light, soft, cotton gloves on their hands at night so they are not able to scratch.   A skin barrier cream  such as zinc oxide may also be helpful in protecting the skin around the anus from irritants. Additional topical agents such as numbing medications, menthol, phenol, camphor, or a combination of them may be helpful. Calmoseptine is used frequently, with a combination of zinc oxide and menthol and can be very beneficial at relieving patients' symptoms. If there is any concern that there may be an infection, topical antibiotics (gentamicin, clindamycin, or bacitracin) or antifungals (clotrimazole, nystatin) may be added in conjunction with other therapies. They can be applied at nighttime before bed and again in the morning after bathing.  Patients coming to the doctor for evaluation of pruritus ani with moderate to severe changes of the skin, may  be treated by application of Berwick's dye (which is a combination of gentian violet and brilliant green pigment) with alcohol. It can relieve itching but will sting if there are open wounds. Your physician can dry the dye with a hair dryer and it can be sealed in place with Benzoin tincture and then again dried in place. This dye can stay in place for several days and will often give great relief while the skin is able to regenerate or re-epithelialize. This treatment is performed in an office setting, but is not used in the home setting.  You may notice that your problems will improve for some time with treatment but then recur.  For a small number of patients, pruritus ani can be quite difficult to manage, and it may be difficult to completely relieve their symptoms.  In these patients, it may be beneficial to try topical capsaicin. Capsaicin comes from Capsicum chili peppers.  It is believed to work by depressing the feelings or desensitizing certain nerves.  This medicine has been studied with a small number of patients, and up to 70% of patients had relief of their symptoms for up to almost 11 months.  The medicine is applied with a very low concentration of 0.006%.   A very small number of patients find only minimal relief from all attempted treatment options.  These individuals may benefit from injectable therapy.  This particular therapy is saved for patients with persistent and intense pruritus ani.  Methylene blue is a dye which can be injected into the skin and may relieve symptoms by causing destruction of the nerve endings.  The methylene blue can be mixed with topical anesthetics and injected into and below the affected perianal region.  Many patients do experience a change in sensation in the injected area.  It may feel somewhat numb like a local anesthetic for a dental procedure.  It also will turn the skin in the area blue.  In very rare cases, this may be injected too close to the surface of  the skin and may cause some skin breakdown or ulcerations.   HEMORRHOID BANDING PROCEDURE    FOLLOW-UP CARE   1. The procedure you have had should have been relatively painless since the banding of the area involved does not have nerve endings and there is no pain sensation.  The rubber band cuts off the blood supply to the hemorrhoid and the band may fall off as soon as 48 hours after the banding (the band may occasionally be seen in the toilet bowl following a bowel movement). You may notice a temporary feeling of fullness in the rectum which should respond adequately to plain Tylenol or Motrin.  2. Following the banding, avoid strenuous exercise that evening and resume full activity the next day.  A  sitz bath (soaking in a warm tub) or bidet is soothing, and can be useful for cleansing the area after bowel movements.     3. To avoid constipation, take two tablespoons of natural wheat bran, natural oat bran, flax, Benefiber or any over the counter fiber supplement and increase your water intake to 7-8 glasses daily.    4. Unless you have been prescribed anorectal medication, do not put anything inside your rectum for two weeks: No suppositories, enemas, fingers, etc.  5. Occasionally, you may have more bleeding than usual after the banding procedure.  This is often from the untreated hemorrhoids rather than the treated one.  Don't be concerned if there is a tablespoon or so of blood.  If there is more blood than this, lie flat with your bottom higher than your head and apply an ice pack to the area. If the bleeding does not stop within a half an hour or if you feel faint, call our office at [phone] or go to the emergency room.  6. Problems are not common; however, if there is a substantial amount of bleeding, severe pain, chills, fever or difficulty passing urine (very rare) or other problems, you should call us at [phone] or report to the nearest emergency room.  7. Do not  stay seated continuously for more than 2-3 hours for a day or two after the procedure.  Tighten your buttock muscles 10-15 times every two hours and take 10-15 deep breaths every 1-2 hours.  Do not spend more than a few minutes on the toilet if you cannot empty your bowel; instead re-visit the toilet at a later time.      I appreciate the opportunity to care for you. Silvano Rusk, MD, [facility]

## 2018-04-20 NOTE — Progress Notes (Signed)
   HEMORRHOID LIGATION  Sxs: prolapse and itching  Last colonoscopy 03/2017 internal hemorrhoids, diverticulosis, diminutive adenoma  Last banding 03/2017 (RP/RA), LL banded x 3 in past  Rectal - mild-mod perianal erythema, No mass and non-tender  Anoscopy Gr 3 LL Gr 1-2 RA/RP internal hemorrhoids   PROCEDURE NOTE: The patient presents with symptomatic grade 3 hemorrhoids, requesting rubber band ligation of his/her hemorrhoidal disease.  All risks, benefits and alternative forms of therapy were described and informed consent was obtained.  In the Left Lateral Decubitus position anoscopic examination revealed grade 3 internal hemorrhoid in the LL position(s).  The anorectum was pre-medicated with 0.125% NTG and 5% lidocaine The decision was made to band the LL internal hemorrhoid, and the [place] was used to perform band ligation without complication.  Digital anorectal examination was then performed to assure proper positioning of the band, and to adjust the banded tissue as required.  The patient was discharged home without pain or other issues.  Dietary and behavioral recommendations were given and along with follow-up instructions.     The following adjunctive treatments were recommended:  Calmoseptine and or topical lidocaine  The patient will return in 06/2018 for  follow-up and possible additional banding as required. No complications were encountered and the patient tolerated the procedure well.  I appreciate the opportunity to care for this patient.  ZO:XWRUEAV, Abigail Butts, MD

## 2018-04-20 NOTE — Assessment & Plan Note (Signed)
LL banded RTC 2 mos

## 2018-05-19 ENCOUNTER — Encounter (HOSPITAL_BASED_OUTPATIENT_CLINIC_OR_DEPARTMENT_OTHER): Payer: Self-pay | Admitting: *Deleted

## 2018-05-19 ENCOUNTER — Other Ambulatory Visit: Payer: Self-pay

## 2018-05-19 ENCOUNTER — Encounter (HOSPITAL_BASED_OUTPATIENT_CLINIC_OR_DEPARTMENT_OTHER)
Admission: RE | Admit: 2018-05-19 | Discharge: 2018-05-19 | Disposition: A | Discharge status: Home / Self Care | Payer: BC Managed Care – PPO | Source: Ambulatory Visit | Attending: Orthopaedic Surgery | Admitting: Orthopaedic Surgery

## 2018-05-19 DIAGNOSIS — Z01810 Encounter for preprocedural cardiovascular examination: Secondary | ICD-10-CM | POA: Diagnosis not present

## 2018-05-19 DIAGNOSIS — I10 Essential (primary) hypertension: Secondary | ICD-10-CM | POA: Insufficient documentation

## 2018-05-24 ENCOUNTER — Encounter (HOSPITAL_BASED_OUTPATIENT_CLINIC_OR_DEPARTMENT_OTHER): Payer: Self-pay | Admitting: Anesthesiology

## 2018-05-24 NOTE — Anesthesia Preprocedure Evaluation (Addendum)
Anesthesia Evaluation  Patient identified by MRN, date of birth, ID band Patient awake    Reviewed: Allergy & Precautions, NPO status , Patient's Chart, lab work & pertinent test results  Airway Mallampati: I       Dental no notable dental hx. (+) Teeth Intact   Pulmonary asthma , former smoker,    Pulmonary exam normal breath sounds clear to auscultation       Cardiovascular hypertension, Pt. on medications and Pt. on home beta blockers Normal cardiovascular exam Rhythm:Regular Rate:Normal     Neuro/Psych    GI/Hepatic Neg liver ROS, GERD  Medicated,  Endo/Other  negative endocrine ROS  Renal/GU negative Renal ROS     Musculoskeletal   Abdominal Normal abdominal exam  (+)   Peds  Hematology negative hematology ROS (+)   Anesthesia Other Findings   Reproductive/Obstetrics                           Anesthesia Physical Anesthesia Plan  ASA: II  Anesthesia Plan: General   Post-op Pain Management:  Regional for Post-op pain   Induction: Intravenous  PONV Risk Score and Plan: 2 and Ondansetron and Dexamethasone  Airway Management Planned: LMA  Additional Equipment:   Intra-op Plan:   Post-operative Plan: Extubation in OR  Informed Consent: I have reviewed the patients History and Physical, chart, labs and discussed the procedure including the risks, benefits and alternatives for the proposed anesthesia with the patient or authorized representative who has indicated his/her understanding and acceptance.   Dental advisory given  Plan Discussed with: CRNA and Surgeon  Anesthesia Plan Comments:       Anesthesia Quick Evaluation

## 2018-05-25 ENCOUNTER — Encounter (HOSPITAL_BASED_OUTPATIENT_CLINIC_OR_DEPARTMENT_OTHER): Payer: Self-pay

## 2018-05-25 ENCOUNTER — Encounter (HOSPITAL_BASED_OUTPATIENT_CLINIC_OR_DEPARTMENT_OTHER)
Admission: RE | Disposition: A | Discharge status: Home / Self Care | Payer: Self-pay | Source: Ambulatory Visit | Attending: General Surgery

## 2018-05-25 ENCOUNTER — Ambulatory Visit (HOSPITAL_BASED_OUTPATIENT_CLINIC_OR_DEPARTMENT_OTHER): Payer: BC Managed Care – PPO | Admitting: Anesthesiology

## 2018-05-25 ENCOUNTER — Other Ambulatory Visit: Payer: Self-pay

## 2018-05-25 ENCOUNTER — Ambulatory Visit (HOSPITAL_COMMUNITY)
Admission: RE | Admit: 2018-05-25 | Discharge: 2018-05-25 | Disposition: A | Discharge status: Home / Self Care | Payer: BC Managed Care – PPO | Source: Ambulatory Visit | Attending: General Surgery | Admitting: General Surgery

## 2018-05-25 DIAGNOSIS — K4090 Unilateral inguinal hernia, without obstruction or gangrene, not specified as recurrent: Secondary | ICD-10-CM | POA: Diagnosis not present

## 2018-05-25 DIAGNOSIS — I10 Essential (primary) hypertension: Secondary | ICD-10-CM | POA: Diagnosis not present

## 2018-05-25 DIAGNOSIS — Z87891 Personal history of nicotine dependence: Secondary | ICD-10-CM | POA: Diagnosis not present

## 2018-05-25 DIAGNOSIS — J45909 Unspecified asthma, uncomplicated: Secondary | ICD-10-CM | POA: Diagnosis not present

## 2018-05-25 DIAGNOSIS — Z79899 Other long term (current) drug therapy: Secondary | ICD-10-CM | POA: Insufficient documentation

## 2018-05-25 DIAGNOSIS — K219 Gastro-esophageal reflux disease without esophagitis: Secondary | ICD-10-CM | POA: Insufficient documentation

## 2018-05-25 HISTORY — PX: INGUINAL HERNIA REPAIR: SHX194

## 2018-05-25 HISTORY — DX: Malignant (primary) neoplasm, unspecified: C80.1

## 2018-05-25 HISTORY — PX: INSERTION OF MESH: SHX5868

## 2018-05-25 HISTORY — DX: Unspecified asthma, uncomplicated: J45.909

## 2018-05-25 SURGERY — REPAIR, HERNIA, INGUINAL, ADULT
Anesthesia: General | Site: Groin | Laterality: Right

## 2018-05-25 MED ORDER — DEXAMETHASONE SODIUM PHOSPHATE 10 MG/ML IJ SOLN
INTRAMUSCULAR | Status: AC
  Filled 2018-05-25: qty 1

## 2018-05-25 MED ORDER — LIDOCAINE HCL (CARDIAC) PF 100 MG/5ML IV SOSY
PREFILLED_SYRINGE | INTRAVENOUS | Status: DC | PRN
  Administered 2018-05-25: 100 mg via INTRAVENOUS

## 2018-05-25 MED ORDER — ACETAMINOPHEN 325 MG PO TABS: 325.0000 mg | ORAL_TABLET | ORAL | Status: DC | PRN

## 2018-05-25 MED ORDER — BUPIVACAINE-EPINEPHRINE 0.25% -1:200000 IJ SOLN
INTRAMUSCULAR | Status: DC | PRN
  Administered 2018-05-25: 30 mL

## 2018-05-25 MED ORDER — PROPOFOL 10 MG/ML IV BOLUS
INTRAVENOUS | Status: DC | PRN
  Administered 2018-05-25: 50 mg via INTRAVENOUS
  Administered 2018-05-25: 150 mg via INTRAVENOUS

## 2018-05-25 MED ORDER — PROPOFOL 10 MG/ML IV BOLUS
INTRAVENOUS | Status: AC
  Filled 2018-05-25: qty 20

## 2018-05-25 MED ORDER — FENTANYL CITRATE (PF) 100 MCG/2ML IJ SOLN
INTRAMUSCULAR | Status: AC
  Filled 2018-05-25: qty 2

## 2018-05-25 MED ORDER — OXYCODONE HCL 5 MG/5ML PO SOLN: 5.0000 mg | Freq: Once | ORAL | Status: AC | PRN

## 2018-05-25 MED ORDER — CHLORHEXIDINE GLUCONATE CLOTH 2 % EX PADS: 6.0000 | MEDICATED_PAD | Freq: Once | CUTANEOUS | Status: DC

## 2018-05-25 MED ORDER — KETOROLAC TROMETHAMINE 30 MG/ML IJ SOLN: 30.0000 mg | Freq: Once | INTRAMUSCULAR | Status: DC | PRN

## 2018-05-25 MED ORDER — ROCURONIUM BROMIDE 100 MG/10ML IV SOLN
INTRAVENOUS | Status: DC | PRN
  Administered 2018-05-25: 40 mg via INTRAVENOUS

## 2018-05-25 MED ORDER — GABAPENTIN 300 MG PO CAPS
ORAL_CAPSULE | ORAL | Status: AC
  Filled 2018-05-25: qty 1

## 2018-05-25 MED ORDER — BUPIVACAINE-EPINEPHRINE (PF) 0.25% -1:200000 IJ SOLN
INTRAMUSCULAR | Status: AC
  Filled 2018-05-25: qty 90

## 2018-05-25 MED ORDER — GABAPENTIN 300 MG PO CAPS
300.0000 mg | ORAL_CAPSULE | ORAL | Status: AC
  Administered 2018-05-25: 300 mg via ORAL

## 2018-05-25 MED ORDER — ONDANSETRON HCL 4 MG/2ML IJ SOLN
INTRAMUSCULAR | Status: AC
  Filled 2018-05-25: qty 2

## 2018-05-25 MED ORDER — MIDAZOLAM HCL 5 MG/5ML IJ SOLN
INTRAMUSCULAR | Status: DC | PRN
  Administered 2018-05-25: 2 mg via INTRAVENOUS

## 2018-05-25 MED ORDER — ONDANSETRON HCL 4 MG/2ML IJ SOLN: 4.0000 mg | Freq: Once | INTRAMUSCULAR | Status: DC | PRN

## 2018-05-25 MED ORDER — MIDAZOLAM HCL 2 MG/2ML IJ SOLN
INTRAMUSCULAR | Status: AC
  Filled 2018-05-25: qty 2

## 2018-05-25 MED ORDER — ONDANSETRON HCL 4 MG/2ML IJ SOLN
INTRAMUSCULAR | Status: DC | PRN
  Administered 2018-05-25: 4 mg via INTRAVENOUS

## 2018-05-25 MED ORDER — ACETAMINOPHEN 160 MG/5ML PO SOLN: 325.0000 mg | ORAL | Status: DC | PRN

## 2018-05-25 MED ORDER — FENTANYL CITRATE (PF) 100 MCG/2ML IJ SOLN
25.0000 ug | INTRAMUSCULAR | Status: DC | PRN
  Administered 2018-05-25: 50 ug via INTRAVENOUS
  Administered 2018-05-25: 25 ug via INTRAVENOUS

## 2018-05-25 MED ORDER — EPHEDRINE SULFATE 50 MG/ML IJ SOLN
INTRAMUSCULAR | Status: DC | PRN
  Administered 2018-05-25 (×2): 25 mg via INTRAVENOUS

## 2018-05-25 MED ORDER — ROPIVACAINE HCL 5 MG/ML IJ SOLN
INTRAMUSCULAR | Status: DC | PRN
  Administered 2018-05-25 (×6): 5 mL via PERINEURAL

## 2018-05-25 MED ORDER — HYDROCODONE-ACETAMINOPHEN 5-325 MG PO TABS: 1.0000 | ORAL_TABLET | Freq: Four times a day (QID) | ORAL | 0 refills | Status: DC | PRN

## 2018-05-25 MED ORDER — VANCOMYCIN HCL IN DEXTROSE 1-5 GM/200ML-% IV SOLN
INTRAVENOUS | Status: AC
  Filled 2018-05-25: qty 200

## 2018-05-25 MED ORDER — ACETAMINOPHEN 500 MG PO TABS
1000.0000 mg | ORAL_TABLET | ORAL | Status: AC
  Administered 2018-05-25: 1000 mg via ORAL

## 2018-05-25 MED ORDER — OXYCODONE HCL 5 MG PO TABS
5.0000 mg | ORAL_TABLET | Freq: Once | ORAL | Status: AC | PRN
  Administered 2018-05-25: 5 mg via ORAL

## 2018-05-25 MED ORDER — MEPERIDINE HCL 25 MG/ML IJ SOLN: 6.2500 mg | INTRAMUSCULAR | Status: DC | PRN

## 2018-05-25 MED ORDER — SUCCINYLCHOLINE CHLORIDE 200 MG/10ML IV SOSY
PREFILLED_SYRINGE | INTRAVENOUS | Status: AC
Start: 2018-05-25 — End: ?
  Filled 2018-05-25: qty 10

## 2018-05-25 MED ORDER — ROCURONIUM BROMIDE 10 MG/ML (PF) SYRINGE
PREFILLED_SYRINGE | INTRAVENOUS | Status: AC
  Filled 2018-05-25: qty 10

## 2018-05-25 MED ORDER — VANCOMYCIN HCL IN DEXTROSE 1-5 GM/200ML-% IV SOLN
1000.0000 mg | INTRAVENOUS | Status: AC
  Administered 2018-05-25: 1000 mg via INTRAVENOUS

## 2018-05-25 MED ORDER — DEXAMETHASONE SODIUM PHOSPHATE 4 MG/ML IJ SOLN
INTRAMUSCULAR | Status: DC | PRN
  Administered 2018-05-25: 10 mg via INTRAVENOUS

## 2018-05-25 MED ORDER — OXYCODONE HCL 5 MG PO TABS
ORAL_TABLET | ORAL | Status: AC
  Filled 2018-05-25: qty 1

## 2018-05-25 MED ORDER — SUGAMMADEX SODIUM 200 MG/2ML IV SOLN
INTRAVENOUS | Status: DC | PRN
  Administered 2018-05-25: 200 mg via INTRAVENOUS

## 2018-05-25 MED ORDER — LACTATED RINGERS IV SOLN
INTRAVENOUS | Status: DC
  Administered 2018-05-25: 07:00:00 via INTRAVENOUS

## 2018-05-25 MED ORDER — SCOPOLAMINE 1 MG/3DAYS TD PT72
1.0000 | MEDICATED_PATCH | Freq: Once | TRANSDERMAL | Status: DC | PRN
Start: 2018-05-25 — End: 2018-05-25

## 2018-05-25 MED ORDER — FENTANYL CITRATE (PF) 100 MCG/2ML IJ SOLN
50.0000 ug | INTRAMUSCULAR | Status: DC | PRN
  Administered 2018-05-25: 100 ug via INTRAVENOUS

## 2018-05-25 MED ORDER — ACETAMINOPHEN 500 MG PO TABS
ORAL_TABLET | ORAL | Status: AC
  Filled 2018-05-25: qty 2

## 2018-05-25 MED ORDER — MIDAZOLAM HCL 2 MG/2ML IJ SOLN
1.0000 mg | INTRAMUSCULAR | Status: DC | PRN
  Administered 2018-05-25: 2 mg via INTRAVENOUS

## 2018-05-25 MED ORDER — LIDOCAINE HCL (CARDIAC) PF 100 MG/5ML IV SOSY
PREFILLED_SYRINGE | INTRAVENOUS | Status: AC
  Filled 2018-05-25: qty 5

## 2018-05-25 SURGICAL SUPPLY — 49 items
BENZOIN TINCTURE PRP APPL 2/3 (GAUZE/BANDAGES/DRESSINGS) IMPLANT
BLADE CLIPPER SURG (BLADE) ×2 IMPLANT
BLADE HEX COATED 2.75 (ELECTRODE) ×2 IMPLANT
BLADE SURG 15 STRL LF DISP TIS (BLADE) ×1 IMPLANT
BLADE SURG 15 STRL SS (BLADE) ×1
CHLORAPREP W/TINT 26ML (MISCELLANEOUS) ×2 IMPLANT
COVER BACK TABLE 60X90IN (DRAPES) ×2 IMPLANT
COVER MAYO STAND STRL (DRAPES) ×2 IMPLANT
DECANTER SPIKE VIAL GLASS SM (MISCELLANEOUS) IMPLANT
DERMABOND ADVANCED (GAUZE/BANDAGES/DRESSINGS) ×1
DERMABOND ADVANCED .7 DNX12 (GAUZE/BANDAGES/DRESSINGS) ×1 IMPLANT
DRAIN PENROSE 1/2X12 LTX STRL (WOUND CARE) ×2 IMPLANT
DRAPE LAPAROTOMY TRNSV 102X78 (DRAPE) ×2 IMPLANT
DRAPE UTILITY XL STRL (DRAPES) ×2 IMPLANT
ELECT REM PT RETURN 9FT ADLT (ELECTROSURGICAL) ×2
ELECTRODE REM PT RTRN 9FT ADLT (ELECTROSURGICAL) ×1 IMPLANT
GLOVE BIO SURGEON STRL SZ7.5 (GLOVE) ×2 IMPLANT
GLOVE BIOGEL PI IND STRL 7.0 (GLOVE) ×1 IMPLANT
GLOVE BIOGEL PI IND STRL 7.5 (GLOVE) ×1 IMPLANT
GLOVE BIOGEL PI INDICATOR 7.0 (GLOVE) ×1
GLOVE BIOGEL PI INDICATOR 7.5 (GLOVE) ×1
GLOVE ECLIPSE 7.0 STRL STRAW (GLOVE) ×2 IMPLANT
GOWN STRL REUS W/ TWL LRG LVL3 (GOWN DISPOSABLE) ×2 IMPLANT
GOWN STRL REUS W/TWL LRG LVL3 (GOWN DISPOSABLE) ×2
MESH ULTRAPRO 3X6 7.6X15CM (Mesh General) ×2 IMPLANT
NEEDLE HYPO 22GX1.5 SAFETY (NEEDLE) IMPLANT
NEEDLE HYPO 25X1 1.5 SAFETY (NEEDLE) ×2 IMPLANT
NS IRRIG 1000ML POUR BTL (IV SOLUTION) ×2 IMPLANT
PACK BASIN DAY SURGERY FS (CUSTOM PROCEDURE TRAY) ×2 IMPLANT
PENCIL BUTTON HOLSTER BLD 10FT (ELECTRODE) ×2 IMPLANT
SLEEVE SCD COMPRESS KNEE MED (MISCELLANEOUS) ×2 IMPLANT
SPONGE LAP 18X18 RF (DISPOSABLE) ×2 IMPLANT
STRIP CLOSURE SKIN 1/2X4 (GAUZE/BANDAGES/DRESSINGS) IMPLANT
SUT MON AB 4-0 PC3 18 (SUTURE) ×2 IMPLANT
SUT PROLENE 2 0 SH DA (SUTURE) ×4 IMPLANT
SUT SILK 2 0 SH (SUTURE) ×2 IMPLANT
SUT SILK 3 0 SH 30 (SUTURE) IMPLANT
SUT SILK 3 0 TIES 17X18 (SUTURE) ×1
SUT SILK 3-0 18XBRD TIE BLK (SUTURE) ×1 IMPLANT
SUT VIC AB 0 CT1 27 (SUTURE)
SUT VIC AB 0 CT1 27XBRD ANBCTR (SUTURE) IMPLANT
SUT VIC AB 0 SH 27 (SUTURE) IMPLANT
SUT VIC AB 2-0 SH 27 (SUTURE) ×1
SUT VIC AB 2-0 SH 27XBRD (SUTURE) ×1 IMPLANT
SUT VIC AB 3-0 SH 27 (SUTURE) ×1
SUT VIC AB 3-0 SH 27X BRD (SUTURE) ×1 IMPLANT
SYR CONTROL 10ML LL (SYRINGE) ×2 IMPLANT
TOWEL GREEN STERILE FF (TOWEL DISPOSABLE) ×4 IMPLANT
TOWEL OR NON WOVEN STRL DISP B (DISPOSABLE) ×2 IMPLANT

## 2018-05-25 NOTE — Progress Notes (Signed)
Assisted Dr. Jillyn Hidden with right, ultrasound guided, transabdominal plane block. Side rails up, monitors on throughout procedure. See vital signs in flow sheet. Tolerated Procedure well.

## 2018-05-25 NOTE — Discharge Instructions (Signed)

## 2018-05-25 NOTE — H&P (Signed)
Tyler [name]  Location: [facility] Patient #: [id number] DOB: November 07, 1951 Single / Language: Tyler [name] / Race: White Male   History of Present Illness  The patient is a 67 year old male who presents with an inguinal hernia. Where asked to see the patient in consultation by Dr. Cari [name] to evaluate him for a right inguinal hernia. The patient is a 67 year old white male who has been recently noticing some pain and swelling on the right groin. He denies any nausea or vomiting. His appetite is good and his bowels are working normally.   Past Surgical History  Appendectomy  Colon Polyp Removal - Colonoscopy  Knee Surgery  Right. Tonsillectomy   Diagnostic Studies History  Colonoscopy  within last year  Allergies  Penicillins  Diflucan *ANTIFUNGALS*  Sulfa Antibiotics  Altabax *DERMATOLOGICALS*   Medication History  AmLODIPine Besylate (5MG  Tablet, Oral) Active. Bisoprolol Fumarate (10MG  Tablet, Oral) Active. Montelukast Sodium (10MG  Tablet, Oral) Active. Tadalafil (5MG  Tablet, Oral) Active. ValACYclovir HCl (500MG  Tablet, Oral) Active. Medications Reconciled  Social History Alcohol use  Moderate alcohol use. Caffeine use  Coffee. No drug use  Tobacco use  Former smoker.  Family History Alcohol Abuse  Father. Colon Cancer  Sister.  Other Problems  Arthritis  Gastroesophageal Reflux Disease  Hemorrhoids  High blood pressure  Inguinal Hernia     Review of Systems General Not Present- Appetite Loss, Chills, Fatigue, Fever, Night Sweats, Weight Gain and Weight Loss. Skin Not Present- Change in Wart/Mole, Dryness, Hives, Jaundice, New Lesions, Non-Healing Wounds, Rash and Ulcer. HEENT Present- Hearing Loss, Seasonal Allergies and Wears glasses/contact lenses. Not Present- Earache, Hoarseness, Nose Bleed, Oral Ulcers, Ringing in the Ears, Sinus Pain, Sore Throat, Visual Disturbances and Yellow Eyes. Respiratory Not  Present- Bloody sputum, Chronic Cough, Difficulty Breathing, Snoring and Wheezing. Breast Not Present- Breast Mass, Breast Pain, Nipple Discharge and Skin Changes. Cardiovascular Not Present- Chest Pain, Difficulty Breathing Lying Down, Leg Cramps, Palpitations, Rapid Heart Rate, Shortness of Breath and Swelling of Extremities. Gastrointestinal Present- Change in Bowel Habits, Chronic diarrhea, Gets full quickly at meals and Hemorrhoids. Not Present- Abdominal Pain, Bloating, Bloody Stool, Constipation, Difficulty Swallowing, Excessive gas, Indigestion, Nausea, Rectal Pain and Vomiting. Male Genitourinary Not Present- Blood in Urine, Change in Urinary Stream, Frequency, Impotence, Nocturia, Painful Urination, Urgency and Urine Leakage. Musculoskeletal Present- Joint Pain. Not Present- Back Pain, Joint Stiffness, Muscle Pain, Muscle Weakness and Swelling of Extremities. Neurological Not Present- Decreased Memory, Fainting, Headaches, Numbness, Seizures, Tingling, Tremor, Trouble walking and Weakness. Psychiatric Present- Change in Sleep Pattern. Not Present- Anxiety, Bipolar, Depression, Fearful and Frequent crying. Endocrine Not Present- Cold Intolerance, Excessive Hunger, Hair Changes, Heat Intolerance, Hot flashes and New Diabetes. Hematology Present- Easy Bruising. Not Present- Blood Thinners, Excessive bleeding, Gland problems, HIV and Persistent Infections.  Vitals  Weight: 170.6 lb Height: 71in Body Surface Area: 1.97 m Body Mass Index: 23.79 kg/m  Temp.: 98.64F(Oral)  Pulse: 69 (Regular)  BP: 130/82 (Sitting, Left Arm, Standard)       Physical Exam  General Mental Status-Alert. General Appearance-Consistent with stated age. Hydration-Well hydrated. Voice-Normal.  Head and Neck Head-normocephalic, atraumatic with no lesions or palpable masses. Trachea-midline. Thyroid Gland Characteristics - normal size and consistency.  Eye Eyeball -  Bilateral-Extraocular movements intact. Sclera/Conjunctiva - Bilateral-No scleral icterus.  Chest and Lung Exam Chest and lung exam reveals -quiet, even and easy respiratory effort with no use of accessory muscles and on auscultation, normal breath sounds, no adventitious sounds and normal vocal resonance. Inspection Chest  Wall - Normal. Back - normal.  Cardiovascular Cardiovascular examination reveals -normal heart sounds, regular rate and rhythm with no murmurs and normal pedal pulses bilaterally.  Abdomen Inspection Inspection of the abdomen reveals - No Hernias. Skin - Scar - no surgical scars. Palpation/Percussion Palpation and Percussion of the abdomen reveal - Soft, Non Tender, No Rebound tenderness, No Rigidity (guarding) and No hepatosplenomegaly. Auscultation Auscultation of the abdomen reveals - Bowel sounds normal.  Male Genitourinary Note: There is a small reducible bulge in the right groin. There is no palpable bulge or impulse was straining in the left groin   Neurologic Neurologic evaluation reveals -alert and oriented x 3 with no impairment of recent or remote memory. Mental Status-Normal.  Musculoskeletal Normal Exam - Left-Upper Extremity Strength Normal and Lower Extremity Strength Normal. Normal Exam - Right-Upper Extremity Strength Normal and Lower Extremity Strength Normal.  Lymphatic Head & Neck  General Head & Neck Lymphatics: Bilateral - Description - Normal. Axillary  General Axillary Region: Bilateral - Description - Normal. Tenderness - Non Tender. Femoral & Inguinal  Generalized Femoral & Inguinal Lymphatics: Bilateral - Description - Normal. Tenderness - Non Tender.    Assessment & Plan  INGUINAL HERNIA OF RIGHT SIDE WITHOUT OBSTRUCTION OR GANGRENE (K40.90) Impression: The patient appears to have a small right inguinal hernia. Because of the risk of incarceration and strangulation I think he would benefit from having this  fixed. He would also like to have this done. I have discussed with him in detail the risks and benefits of the operation as well as some of the technical aspects including the use of mesh and the risk of pain and he understands and wishes to proceed. He is singing in the [place] so we will plan to do this after that event which will likely be in July.

## 2018-05-25 NOTE — Op Note (Signed)
05/25/2018  9:03 AM  PATIENT:  Tyler [name]  67 y.o. male  PRE-OPERATIVE DIAGNOSIS:  right inguinal hernia  POST-OPERATIVE DIAGNOSIS:  right inguinal hernia  PROCEDURE:  Procedure(s): RIGHT INGUINAL HERNIA REPAIR WITH MESH (Right) INSERTION OF MESH (Right)  SURGEON:  Surgeon(s) and Role:    Jovita Kussmaul, MD - Primary  PHYSICIAN ASSISTANT:   ASSISTANTS: none   ANESTHESIA:   local and general  EBL:  20 mL   BLOOD ADMINISTERED:none  DRAINS: none   LOCAL MEDICATIONS USED:  MARCAINE     SPECIMEN:  No Specimen  DISPOSITION OF SPECIMEN:  N/A  COUNTS:  YES  TOURNIQUET:  * No tourniquets in log *  DICTATION: .Dragon Dictation   After informed consent was obtained the patient was brought to the operating room and placed in the supine position on the operating table.  After adequate induction of general anesthesia the patient's abdomen and right groin were prepped with ChloraPrep, allowed to dry, and draped in usual sterile manner.  An appropriate timeout was performed.  The right groin area was then infiltrated with quarter percent Marcaine.  A small incision was made from the edge of the pubic tubercle on the right towards the anterior superior iliac spine.  The incision was carried through the skin and subcutaneous tissue sharply with the electrocautery until the fascia of the external oblique was encountered.  A small bridging vein was clamped with hemostats, divided, and ligated with 3-0 silk ties.  The external oblique fascia was then opened along its fibers towards the apex of the external ring with a 15 blade knife and Metzenbaum scissors.  A [place] retractor was deployed.  Blunt dissection was carried out of the cord structures until they could be surrounded between 2 fingers.  Half inch Penrose drain was placed around the cord structures for retraction purposes.  The cord was then gently skeletonized by blunt hemostat dissection.  No hernia sac was identified with  the cord.  There was a broad-based weakness and defect in the floor of the canal.  The floor the canal was repaired with interrupted 0 Vicryl stitches.  A 3 x 6 piece of ultra Pro mesh was then chosen and cut to the appropriate size.  Tails were cut in the mesh laterally and the tails were wrapped around the cord structures.  Inferiorly the mesh was sewed to the inguinal ligament with a running 2-0 Prolene stitch.  Superiorly the mesh was sewed to the musculoaponeurotic strength layer of the transversalis with interrupted 2-0 Prolene vertical mattress stitches.  During the dissection the ilioinguinal nerve was identified and was involved with some scar tissue.  It was dissected free proximally and distally, clamped with hemostats, divided, and ligated with 2-0 silk ties.  Lateral to the cord the tails of the mesh were anchored to the shelving edge of the inguinal ligament with interrupted 2-0 Prolene stitch.  Once this was accomplished the mesh was in good position and the hernia seem well repaired.  The wound was irrigated with copious amounts of saline.  The external oblique fascia was then reapproximated with a running 2-0 Vicryl stitch.  The wound was infiltrated with more quarter percent Marcaine.  The subcutaneous fascia was closed with a running 3-0 Vicryl stitch.  The skin was then closed with a running 4-0 Monocryl subcuticular stitch.  Dermabond dressings were applied.  The patient tolerated the procedure well.  At the end of the case all needle sponge and instrument counts were  correct.  The patient was then awakened and taken to recovery in stable condition.  Patient's testicles in the scrotum at the end of the case.  PLAN OF CARE: Discharge to home after PACU  PATIENT DISPOSITION:  PACU - hemodynamically stable.   Delay start of Pharmacological VTE agent (>24hrs) due to surgical blood loss or risk of bleeding: not applicable

## 2018-05-25 NOTE — Transfer of Care (Signed)
Immediate Anesthesia Transfer of Care Note  Patient: Tyler [name]  Procedure(s) Performed: RIGHT INGUINAL HERNIA REPAIR WITH MESH (Right Groin) INSERTION OF MESH (Right Groin)  Patient Location: PACU  Anesthesia Type:GA combined with regional for post-op pain  Level of Consciousness: sedated  Airway & Oxygen Therapy: Patient Spontanous Breathing and Patient connected to face mask oxygen  Post-op Assessment: Report given to RN and Post -op Vital signs reviewed and stable  Post vital signs: Reviewed and stable  Last Vitals:  Vitals Value Taken Time  BP 121/81 05/25/2018  9:09 AM  Temp    Pulse 84 05/25/2018  9:10 AM  Resp 15 05/25/2018  9:10 AM  SpO2 100 % 05/25/2018  9:10 AM  Vitals shown include unvalidated device data.  Last Pain:  Vitals:   05/25/18 0648  TempSrc: Oral  PainSc: 0-No pain         Complications: No apparent anesthesia complications

## 2018-05-25 NOTE — Anesthesia Postprocedure Evaluation (Signed)
Anesthesia Post Note  Patient: Tyler [name]  Procedure(s) Performed: RIGHT INGUINAL HERNIA REPAIR WITH MESH (Right Groin) INSERTION OF MESH (Right Groin)     Patient location during evaluation: PACU Anesthesia Type: General Level of consciousness: awake and sedated Pain management: pain level controlled Vital Signs Assessment: post-procedure vital signs reviewed and stable Respiratory status: spontaneous breathing Cardiovascular status: stable Postop Assessment: no apparent nausea or vomiting Anesthetic complications: no    Last Vitals:  Vitals:   05/25/18 0930 05/25/18 0945  BP: 123/81 125/87  Pulse: 80 79  Resp: 15 12  Temp:    SpO2: 98% 98%    Last Pain:  Vitals:   05/25/18 0945  TempSrc:   PainSc: 6    Pain Goal:                 Adilee Lemme JR,JOHN Lynnzie Blackson

## 2018-05-25 NOTE — Anesthesia Procedure Notes (Signed)
Procedure Name: Intubation Date/Time: 05/25/2018 7:43 AM Performed by: Marrianne Mood, CRNA Pre-anesthesia Checklist: Patient identified, Emergency Drugs available, Suction available, Patient being monitored and Timeout performed Patient Re-evaluated:Patient Re-evaluated prior to induction Oxygen Delivery Method: Circle system utilized Preoxygenation: Pre-oxygenation with 100% oxygen Induction Type: IV induction Ventilation: Mask ventilation without difficulty Laryngoscope Size: Mac and 3 Grade View: Grade II Tube type: Oral Tube size: 6.5 mm Number of attempts: 1 Airway Equipment and Method: Stylet and Oral airway Placement Confirmation: ETT inserted through vocal cords under direct vision,  positive ETCO2 and breath sounds checked- equal and bilateral Secured at: 20 cm Tube secured with: Tape Dental Injury: Teeth and Oropharynx as per pre-operative assessment

## 2018-05-25 NOTE — Interval H&P Note (Signed)
History and Physical Interval Note:  05/25/2018 7:14 AM  Tyler [name]  has presented today for surgery, with the diagnosis of right inguinal hernia  The various methods of treatment have been discussed with the patient and family. After consideration of risks, benefits and other options for treatment, the patient has consented to  Procedure(s): RIGHT INGUINAL HERNIA REPAIR WITH MESH (Right) INSERTION OF MESH (Right) as a surgical intervention .  The patient's history has been reviewed, patient examined, no change in status, stable for surgery.  I have reviewed the patient's chart and labs.  Questions were answered to the patient's satisfaction.     TOTH III,PAUL S

## 2018-05-25 NOTE — Anesthesia Procedure Notes (Signed)
Anesthesia Regional Block: TAP block   Pre-Anesthetic Checklist: ,, timeout performed, Correct Patient, Correct Site, Correct Laterality, Correct Procedure, Correct Position, site marked, Risks and benefits discussed,  Surgical consent,  Pre-op evaluation,  At surgeon's request and post-op pain management  Laterality: Right and N/A  Prep: chloraprep       Needles:  Injection technique: Single-shot  Needle Type: Echogenic Stimulator Needle     Needle Length: 9cm  Needle Gauge: 21   Needle insertion depth: 2 cm   Additional Needles:   Procedures:,,,, ultrasound used (permanent image in chart),,,,  Narrative:  Start time: 05/25/2018 7:12 AM End time: 05/25/2018 7:25 AM Injection made incrementally with aspirations every 5 mL.  Performed by: Personally  Anesthesiologist: Lyn Hollingshead, MD

## 2018-05-26 ENCOUNTER — Encounter (HOSPITAL_BASED_OUTPATIENT_CLINIC_OR_DEPARTMENT_OTHER): Payer: Self-pay | Admitting: General Surgery

## 2018-06-12 ENCOUNTER — Telehealth: Payer: Self-pay | Admitting: Internal Medicine

## 2018-06-12 NOTE — Telephone Encounter (Signed)
Had hernia surgery on May 15, 2018 .  Had constipation surgeon started pt on stool softener which helps some,  has a small amount of BRB blood and pain feels like a "tear" in the anus "near where the hemorrhoid was.  Had banding on July 6 with no issues, on July 8 did have some rectal bleeding.  Has an appt on 8/15 with Dr Carlean Purl.  Pt stated the bleeding is light and only with BM.  He was offered an appt with PA or NP but declines and wishes to only see Dr Carlean Purl.  He was advised to call back if bleeding or pain worsens.

## 2018-06-29 ENCOUNTER — Encounter

## 2018-06-29 ENCOUNTER — Encounter: Payer: Self-pay | Admitting: Internal Medicine

## 2018-06-29 ENCOUNTER — Ambulatory Visit: Payer: BC Managed Care – PPO | Admitting: Internal Medicine

## 2018-06-29 DIAGNOSIS — K642 Third degree hemorrhoids: Secondary | ICD-10-CM | POA: Diagnosis not present

## 2018-06-29 NOTE — Patient Instructions (Signed)
  You have been scheduled for an appointment with Dr Leighton Ruff at [facility]. Your appointment is on ____________ at _________________. Please arrive at ____________ for registration. Make certain to bring a list of current medications, including any over the counter medications or vitamins. Also bring your co-pay if you have one as well as your insurance cards. [facility] is located at [address]. Should you need to reschedule your appointment, please contact them at [phone].    I appreciate the opportunity to care for you. Silvano Rusk, MD, [facility]

## 2018-07-02 ENCOUNTER — Encounter: Payer: Self-pay | Admitting: Internal Medicine

## 2018-07-02 NOTE — Assessment & Plan Note (Signed)
He still has a prolapsing grade 3 left lateral internal hemorrhoid.  It has been banded for times the last in June of this year.  While banding it again is unlikely to cause problems I have recommended he see evaluation by Dr. Marcello Moores of colorectal surgery for more definitive treatment.

## 2018-07-02 NOTE — Progress Notes (Signed)
Tyler [name] 67 y.o. [birth year] [id number]  Assessment & Plan:   Prolapsed internal hemorrhoids, grade 3 He still has a prolapsing grade 3 left lateral internal hemorrhoid.  It has been banded for times the last in June of this year.  While banding it again is unlikely to cause problems I have recommended he see evaluation by Dr. Marcello Moores of colorectal surgery for more definitive treatment.   Cc: Cari Caraway, MD Leighton Ruff, MD  Subjective:   Chief Complaint: Symptomatic hemorrhoids  HPI [name] is here for follow-up after I banded his left lateral internal hemorrhoid for the fourth time in June.  He will have some transient improvement and then the symptoms recur.  Over the years I have banded this 4 times.  In 2018 I repeated a colonoscopy because of persistent bleeding and it showed the hemorrhoids and a 4 mm polyp so the conclusion was hemorrhoids were still bleeding.  His bowel habits are regular there is no significant straining etc.  He continues to perform in saying. Allergies  Allergen Reactions  . Diflucan [Fluconazole]     rash  . Dulera [Mometasone Furo-Formoterol Fum] Other (See Comments)    Lack of therapeutic effect  . Penicillins     REACTION: High fever  . Sulfonamide Derivatives     REACTION: joints ache  . Altabax [Retapamulin] Rash   Current Meds  Medication Sig  . amLODipine (NORVASC) 5 MG tablet TAKE 1 TABLET BY MOUTH DAILY  . bisoprolol (ZEBETA) 5 MG tablet Take 1 tablet (5 mg total) by mouth daily.  Marland Kitchen esomeprazole (NEXIUM) 20 MG capsule Take 1 capsule (20 mg total) by mouth daily before breakfast.  . meloxicam (MOBIC) 15 MG tablet Take 1 tablet by mouth as needed.  . montelukast (SINGULAIR) 10 MG tablet TAKE 1 TABLET BY MOUTH EVERY NIGHT AT BEDTIME  . tadalafil (CIALIS) 10 MG tablet Take 10 mg by mouth daily as needed for erectile dysfunction.  . valACYclovir (VALTREX) 500 MG tablet Take 500 mg by mouth daily.  . [DISCONTINUED]  tadalafil (CIALIS) 5 MG tablet Take 1 tablet (5 mg total) by mouth daily as needed for erectile dysfunction.   Past Medical History:  Diagnosis Date  . Allergic rhinitis   . Arthritis   . ASTHMA 05/11/2007  . Asthma   . 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 DJD (degenerative joint disease)   . ED (erectile dysfunction)   . External hemorrhoids with other complication 27/05/8241  . FASCIITIS, [place] 05/14/2010  . GERD 06/28/2007  . HSV (herpes simplex virus) infection   . Hx of adenomatous polyp of colon 04/07/2017  . Hypertension   . Meningitis   . Pityriasis rosea 06/28/2007  . Prolapsed internal hemorrhoids, grade 3 10/30/2012  . Rash    bruises easily per medical history form dated 04/05/11.  . Shingles    at age 64  . Thrombocytopenia ([place]) 12/03/2010  . Whooping cough 11/26/2016   on z pack   Past Surgical History:  Procedure Laterality Date  . APPENDECTOMY  03/2011   ruptured  . COLONOSCOPY    . HEMORRHOID BANDING    . INGUINAL HERNIA REPAIR Right 05/25/2018   Procedure: RIGHT INGUINAL HERNIA REPAIR WITH MESH;  Surgeon: Jovita Kussmaul, MD;  Location: [place];  Service: General;  Laterality: Right;  . INSERTION OF MESH Right 05/25/2018   Procedure: INSERTION OF MESH;  Surgeon: Jovita Kussmaul, MD;  Location: [facility];  Service: General;  Laterality: Right;  . KNEE ARTHROSCOPY W/ DEBRIDEMENT Right 2006   Dr Gaynelle Arabian  . l index finger     tnedon replacement  . TONSILLECTOMY     at age 58  . 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  Male partners   Former smoker   No substance abuse   Alcohol 2/day   family history includes AAA (abdominal aortic aneurysm) in his mother; Glaucoma in his sister; Stomach cancer in his paternal grandfather.   Review of Systems As per  hpi  Objective:   Physical Exam BP 108/70 (BP Location: Left Arm, Patient Position: Sitting, Cuff Size: Normal)   Pulse 72   Ht 5\' 9"  (1.753 m) Comment: height measured without shoes  Wt 168 lb 4 oz (76.3 kg)   BMI 24.85 kg/m  No acute distress  Digital rectal exam with normal anoderm, no mass nontender there is a prolapsed left lateral internal hemorrhoid mild reducible  Anoscopy reveals a grade 3 left lateral internal hemorrhoid and grade 2 right anterior and posterior

## 2019-01-15 ENCOUNTER — Ambulatory Visit
Admission: RE | Admit: 2019-01-15 | Discharge: 2019-01-15 | Disposition: A | Discharge status: Home / Self Care | Payer: BC Managed Care – PPO | Source: Ambulatory Visit | Attending: Family Medicine | Admitting: Family Medicine

## 2019-01-15 ENCOUNTER — Other Ambulatory Visit: Payer: Self-pay | Admitting: Family Medicine

## 2019-01-15 DIAGNOSIS — Z09 Encounter for follow-up examination after completed treatment for conditions other than malignant neoplasm: Secondary | ICD-10-CM

## 2019-12-24 ENCOUNTER — Ambulatory Visit: Payer: BC Managed Care – PPO | Attending: Internal Medicine

## 2019-12-24 DIAGNOSIS — Z23 Encounter for immunization: Secondary | ICD-10-CM | POA: Insufficient documentation

## 2019-12-24 NOTE — Progress Notes (Signed)
   Covid-19 Vaccination Clinic  Name:  Tyler [name]    MRN: [id number] DOB: 10-04-1951  12/24/2019  [name] was observed post Covid-19 immunization for 15 minutes without incidence. He was provided with Vaccine Information Sheet and instruction to access the V-Safe system.   [name] was instructed to call 911 with any severe reactions post vaccine: Marland Kitchen Difficulty breathing  . Swelling of your face and throat  . A fast heartbeat  . A bad rash all over your body  . Dizziness and weakness    Immunizations Administered    Name Date Dose VIS Date Route   Pfizer COVID-19 Vaccine 12/24/2019  5:05 PM 0.3 mL 10/26/2019 Intramuscular   Manufacturer: [place]   Lot: [id number]   [place]: [id number]

## 2020-01-18 ENCOUNTER — Ambulatory Visit: Payer: BC Managed Care – PPO | Attending: Internal Medicine

## 2020-01-18 ENCOUNTER — Other Ambulatory Visit: Payer: Self-pay

## 2020-01-18 DIAGNOSIS — Z23 Encounter for immunization: Secondary | ICD-10-CM | POA: Insufficient documentation

## 2020-01-18 NOTE — Progress Notes (Signed)
   Covid-19 Vaccination Clinic  Name:  Tyler [name]    MRN: [id number] DOB: 1950-11-25  01/18/2020  [name] was observed post Covid-19 immunization for 15 minutes without incident. He was provided with Vaccine Information Sheet and instruction to access the V-Safe system.   [name] was instructed to call 911 with any severe reactions post vaccine: Marland Kitchen Difficulty breathing  . Swelling of face and throat  . A fast heartbeat  . A bad rash all over body  . Dizziness and weakness   Immunizations Administered    Name Date Dose VIS Date Route   Pfizer COVID-19 Vaccine 01/18/2020  3:06 PM 0.3 mL 10/26/2019 Intramuscular   Manufacturer: [place]   Lot: [id number]   [place]: [id number]

## 2021-05-15 HISTORY — PX: MOHS SURGERY: SUR867

## 2021-06-23 ENCOUNTER — Emergency Department (HOSPITAL_BASED_OUTPATIENT_CLINIC_OR_DEPARTMENT_OTHER)
Admission: EM | Admit: 2021-06-23 | Discharge: 2021-06-23 | Disposition: A | Discharge status: Home / Self Care | Payer: BC Managed Care – PPO | Attending: Emergency Medicine | Admitting: Emergency Medicine

## 2021-06-23 ENCOUNTER — Encounter (HOSPITAL_BASED_OUTPATIENT_CLINIC_OR_DEPARTMENT_OTHER): Payer: Self-pay | Admitting: Emergency Medicine

## 2021-06-23 ENCOUNTER — Other Ambulatory Visit: Payer: Self-pay

## 2021-06-23 DIAGNOSIS — S8991XA Unspecified injury of right lower leg, initial encounter: Secondary | ICD-10-CM | POA: Diagnosis present

## 2021-06-23 DIAGNOSIS — I10 Essential (primary) hypertension: Secondary | ICD-10-CM | POA: Insufficient documentation

## 2021-06-23 DIAGNOSIS — Z87891 Personal history of nicotine dependence: Secondary | ICD-10-CM | POA: Insufficient documentation

## 2021-06-23 DIAGNOSIS — Y92009 Unspecified place in unspecified non-institutional (private) residence as the place of occurrence of the external cause: Secondary | ICD-10-CM | POA: Insufficient documentation

## 2021-06-23 DIAGNOSIS — Z85828 Personal history of other malignant neoplasm of skin: Secondary | ICD-10-CM | POA: Diagnosis not present

## 2021-06-23 DIAGNOSIS — Z79899 Other long term (current) drug therapy: Secondary | ICD-10-CM | POA: Insufficient documentation

## 2021-06-23 DIAGNOSIS — S81811A Laceration without foreign body, right lower leg, initial encounter: Secondary | ICD-10-CM | POA: Diagnosis not present

## 2021-06-23 DIAGNOSIS — S81812A Laceration without foreign body, left lower leg, initial encounter: Secondary | ICD-10-CM

## 2021-06-23 DIAGNOSIS — J45909 Unspecified asthma, uncomplicated: Secondary | ICD-10-CM | POA: Insufficient documentation

## 2021-06-23 DIAGNOSIS — Z23 Encounter for immunization: Secondary | ICD-10-CM | POA: Diagnosis not present

## 2021-06-23 DIAGNOSIS — W548XXA Other contact with dog, initial encounter: Secondary | ICD-10-CM | POA: Diagnosis not present

## 2021-06-23 MED ORDER — TETANUS-DIPHTH-ACELL PERTUSSIS 5-2.5-18.5 LF-MCG/0.5 IM SUSY
0.5000 mL | PREFILLED_SYRINGE | Freq: Once | INTRAMUSCULAR | Status: AC
  Administered 2021-06-23: 0.5 mL via INTRAMUSCULAR
  Filled 2021-06-23: qty 0.5

## 2021-06-23 NOTE — Discharge Instructions (Addendum)
Leave dressing in place if possible.  Follow-up with your primary care provider.

## 2021-06-23 NOTE — ED Triage Notes (Signed)
Pt reports friend's dog got over-excited and scratched right leg, causing laceration and "skin flap"; pt reports it occurred at 1730 and he performed home dressing, bleeding noted through dressing but did not want to remove dressing at this time and reports "it hasn't bled any more in about an hour"; bleeding controlled, pt ambulatory

## 2021-06-23 NOTE — ED Provider Notes (Signed)
[facility] Provider Note   CSN: [id number] Arrival date & time: 06/23/21  1906     History Chief Complaint  Patient presents with   Laceration    Tyler [name] is a 70 y.o. male.  Tyler [name] is a 70 yo male who presents today with a chief complaint of a laceration on his right anterior shin. He states he was at his friends house earlier this evening around 5pm and friend's dog jumped on his leg and scratched him with its nails, causing a large, V-shaped tear with overlaying skin flap. He replaced the skin flap over the wound and cleaned and bandaged the area, but the bleeding continued. He notes a history of thin skin and skin tears, but states this is the largest one he has sustained. He reports his last tetanus was 8 years ago.      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   Shingles    at age 86   Thrombocytopenia ([place]) 12/03/2010   Whooping cough 11/26/2016   on z pack    Patient Active Problem List   Diagnosis Date Noted   Pruritus ani 04/20/2018   Hx of adenomatous polyp of colon 04/07/2017   Perianal dermatitis 01/14/2016   Low testosterone 09/19/2013   Sinusitis 12/21/2012   Herpes labialis 10/30/2012   Eczema 10/30/2012   Prolapsed internal hemorrhoids, grade 3 10/30/2012   Hypertension 04/13/2011   FASCIITIS, PLANTAR 05/14/2010   NEOPLASM, SKIN, UNCERTAIN BEHAVIOR  [id number]   ALLERGIC RHINITIS 08/16/2008   Rash and other nonspecific skin eruption 07/02/2008   ABNORMAL COAGULATION PROFILE 05/29/2008   GERD 06/28/2007   PITYRIASIS ROSEA 06/28/2007   OSTEOARTHROSIS, LOCAL NOS, LOWER LEG 06/28/2007   Asthma, chronic 05/11/2007    Past Surgical History:  Procedure Laterality Date   APPENDECTOMY  03/2011   ruptured   COLONOSCOPY     HEMORRHOID BANDING     INGUINAL HERNIA REPAIR Right 05/25/2018   Procedure: RIGHT INGUINAL HERNIA REPAIR WITH MESH;  Surgeon: Jovita Kussmaul, MD;  Location: [facility];  Service: General;  Laterality: Right;   INSERTION OF MESH Right 05/25/2018   Procedure: INSERTION OF MESH;  Surgeon: Jovita Kussmaul, MD;  Location: [facility];  Service: General;  Laterality: Right;   KNEE ARTHROSCOPY W/ DEBRIDEMENT Right 2006   Dr Pilar Plate Aluisio   l index finger     tnedon replacement   MOHS SURGERY     TONSILLECTOMY     at age 64   vocal cord biospy         Family History  Problem Relation Age of Onset   AAA (abdominal aortic aneurysm) Mother    Glaucoma Sister    Stomach cancer Paternal Grandfather    Colon cancer Neg Hx     Social History  Tobacco Use   Smoking status: Former    Years: 15.00    Types: Cigarettes    Quit date: 11/15/1988    Years since quitting: 32.6   Smokeless tobacco: Never  Vaping Use   Vaping Use: Never used  Substance Use Topics   Alcohol use: Yes    Alcohol/week: 2.0 standard drinks    Types: 2 Standard drinks or equivalent per week    Comment: 3 oz nightly per history form dated 04/05/11.   Drug use: No    Home Medications Prior to Admission medications   Medication Sig Start Date End Date Taking? Authorizing Provider  amLODipine (NORVASC) 5 MG tablet TAKE 1 TABLET BY MOUTH DAILY 04/29/14   Ricard Dillon, MD  bisoprolol (ZEBETA) 5 MG tablet Take 1 tablet (5 mg total) by mouth daily. 03/18/14   Ricard Dillon, MD  esomeprazole (NEXIUM) 20 MG capsule Take  1 capsule (20 mg total) by mouth daily before breakfast. 09/17/13   Ricard Dillon, MD  HYDROcodone-acetaminophen [facility]) 10-325 MG tablet Take 1 tablet by mouth as needed.    [provider]  meloxicam (MOBIC) 15 MG tablet Take 1 tablet by mouth as needed. 04/25/18   [provider]  montelukast (SINGULAIR) 10 MG tablet TAKE 1 TABLET BY MOUTH EVERY NIGHT AT BEDTIME 09/10/13   Ricard Dillon, MD  tadalafil (CIALIS) 10 MG tablet Take 10 mg by mouth daily as needed for erectile dysfunction.    [provider]  valACYclovir (VALTREX) 500 MG tablet Take 500 mg by mouth daily.    [provider]    Allergies    Diflucan [fluconazole], Griseofulvin, Dulera [mometasone furo-formoterol fum], Penicillins, Sulfonamide derivatives, and Altabax [retapamulin]  Review of Systems   Review of Systems  Constitutional:  Negative for fever.  Musculoskeletal:  Negative for arthralgias, gait problem and myalgias.  Skin:  Positive for wound.  Allergic/Immunologic: Negative for immunocompromised state.  Neurological:  Negative for weakness and numbness.  Hematological:  Does not bruise/bleed easily.   Physical Exam Updated Vital Signs BP (!) 141/87   Pulse 72   Temp 98.7 F (37.1 C) (Oral)   Resp 19   Ht '5\' 9"'$  (1.753 m)   Wt 77.1 kg   SpO2 100%   BMI 25.10 kg/m   Physical Exam Vitals and nursing note reviewed.  Constitutional:      General: He is not in acute distress.    Appearance: He is well-developed. He is not diaphoretic.  HENT:     Head: Normocephalic and atraumatic.  Cardiovascular:     Pulses: Normal pulses.  Pulmonary:     Effort: Pulmonary effort is normal.  Musculoskeletal:        General: Tenderness and signs of injury present. No swelling or deformity.     Comments: Large "V" shape skin tear to right anterior lower leg, no active bleeding.  Skin:    General: Skin is warm and dry.     Findings: No erythema or rash.  Neurological:     Mental  Status: He is alert and oriented to person, place, and time.  Psychiatric:        Behavior: Behavior normal.    ED Results / Procedures / Treatments   Labs (all labs ordered are listed, but only abnormal results are displayed) Labs Reviewed - No data to display  EKG None  Radiology No results found.  Procedures Procedures   Medications Ordered in ED Medications  Tdap (BOOSTRIX) injection 0.5  mL (0.5 mLs Intramuscular Given 06/23/21 2028)    ED Course  I have reviewed the triage vital signs and the nursing notes.  Pertinent labs & imaging results that were available during my care of the patient were reviewed by me and considered in my medical decision making (see chart for details).  Clinical Course as of 06/23/21 2321  Tue Jun 23, 4161  4148 70 year old male presents with a large skin tear to his right lower leg after a dog jumped on him earlier this evening.  Patient cleaned the wound and straightened out the skin tear back into place.  No further bleeding.  Tetanus is updated, plan is to clean and dressed with Tegaderm, Ace wrap applied for gentle compression and recommend recheck with PCP. [LM]    Clinical Course User Index [LM] Roque Lias   MDM Rules/Calculators/A&P                           Final Clinical Impression(s) / ED Diagnoses Final diagnoses:  Skin tear of left lower leg without complication, initial encounter    Rx / DC Orders ED Discharge Orders     None        Roque Lias 06/23/21 2321    Isla Pence, MD 06/24/21 2133

## 2021-06-23 NOTE — ED Notes (Signed)
Wound care done, tegaderm applied and wrapped with Ace wrap for compression.

## 2021-07-24 ENCOUNTER — Other Ambulatory Visit: Payer: Self-pay

## 2021-07-24 ENCOUNTER — Ambulatory Visit (INDEPENDENT_AMBULATORY_CARE_PROVIDER_SITE_OTHER): Payer: BC Managed Care – PPO

## 2021-07-24 DIAGNOSIS — Z23 Encounter for immunization: Secondary | ICD-10-CM

## 2021-07-24 NOTE — Progress Notes (Signed)
    [facility]  Name:  Tyler [name]    MRN: [id number] DOB: [birth year]   07/24/2021  [name] was observed post JYNNEOS immunization for 15 minutes without incident. He was provided with Vaccine Information Sheet and instruction to access the V-Safe system.   [name] was instructed to call 911 with any severe reactions post vaccine: Difficulty breathing  Swelling of face and throat  A fast heartbeat  A bad rash all over body  Dizziness and weakness

## 2021-09-04 ENCOUNTER — Ambulatory Visit: Payer: BC Managed Care – PPO

## 2021-09-21 ENCOUNTER — Telehealth: Payer: Self-pay

## 2021-09-21 NOTE — Telephone Encounter (Signed)
Patient returned call and scheduled for 2nd mpox vaccine. Tyler [name]

## 2021-09-21 NOTE — Telephone Encounter (Signed)
Attempted to contact patient to make aware that Jynneos vaccine available at [facility]. Patient needs 2nd dose. Left patient a voicemail to give our office a call at [place]

## 2021-09-25 ENCOUNTER — Other Ambulatory Visit: Payer: Self-pay

## 2021-09-25 ENCOUNTER — Ambulatory Visit: Payer: BC Managed Care – PPO

## 2021-09-25 DIAGNOSIS — Z23 Encounter for immunization: Secondary | ICD-10-CM

## 2021-09-25 NOTE — Progress Notes (Signed)
    [facility]  Name:  Tyler [name]    MRN: [id number] DOB: 1951/10/26   09/25/2021  [name] was observed post JYNNEOS immunization for 15 minutes without incident. He was provided with Vaccine Information Sheet and instruction to access the V-Safe system.   [name] was instructed to call 911 with any severe reactions post vaccine: Difficulty breathing  Swelling of face and throat  A fast heartbeat  A bad rash all over body  Dizziness and weakness     Carlean Purl, RN

## 2022-03-02 ENCOUNTER — Ambulatory Visit: Payer: Self-pay | Admitting: General Surgery

## 2022-03-02 NOTE — H&P (View-Only) (Signed)
?PROVIDER:  Monico Blitz, MD ? ?MRN: [id number] ?DOB: Mar 28, 1951 ?DATE OF ENCOUNTER: 03/02/2022 ?Subjective  ? ?Chief Complaint: Follow-up ?  ? ? ?History of Present Illness: ?Tyler [name] is a 71 y.o. male who is seen today for rectal bleeding.  Patient in referral from Dr. Carlean Purl.  He has had rectal bleeding for several years.  He has underwent multiple band ligations with Dr. Carlean Purl in the past and this did not resolve his symptoms.  He continues to have prolapsing hemorrhoids which have to be manually reduced after bowel movements.  Bleeding episodes are occasional.  He reports regular bowel movements and denies any constipation or straining.  Patient was seen in the office in June 2022 and noted to have large grade 3 hemorrhoids in all 3 locations.  A trans hemorrhoidal dearterialization procedure was offered to the patient.  He did not feel that this would fit into his schedule at that time and wanted to postpone surgery. ? ?Past Medical History:  ?Diagnosis Date  ? Asthma without status asthmaticus   ? GERD (gastroesophageal reflux disease)   ? Hypertension   ? ? ?Past Surgical History:  ?Procedure Laterality Date  ? APPENDECTOMY    ? arythenoid cartilage biopsy    ? KNEE ARTHROSCOPY    ? LIGAMENT RECONSTRUCTION & TENDON INTERPOSITION HAND Left   ? TONSILLECTOMY    ? ?Family History  ?Problem Relation Age of Onset  ? Aneurysm Mother   ? ?Social History  ? ?Socioeconomic History  ? Marital status: Single  ?Tobacco Use  ? Smoking status: Former  ?  Packs/day: 0.25  ?  Years: 15.00  ?  Pack years: 3.75  ?  Types: Cigarettes  ?  Quit date: 11/15/1988  ?  Years since quitting: 33.3  ? Smokeless tobacco: Never  ?Substance and Sexual Activity  ? Alcohol use: Yes  ? Drug use: No  ? ?Current Outpatient Medications  ?Medication Instructions  ? albuterol (PROVENTIL HFA,VENTOLIN HFA) 90 mcg/actuation inhaler No dose, route, or frequency recorded.  ? amLODIPine (NORVASC) 5 mg, Oral, Daily  ? azelastine (ASTELIN)  137 mcg nasal spray 2 sprays, 2 times Daily  ? bisoprolol (ZEBETA) 10 MG tablet TK 1 T PO QD  ? hydrocortisone-pramoxine (ANALPRAM-HC) 2.5-1 % rectal cream No dose, route, or frequency recorded.  ? montelukast (SINGULAIR) 10 mg tablet 1 tablet, Nightly  ? NexIUM 20 mg, Oral, Daily  ? tadalafiL (CIALIS) 5 mg, Oral, As needed  ? valacyclovir (VALTREX) 1000 MG tablet No dose, route, or frequency recorded.  ? ?Allergies  ?Allergen Reactions  ? Griseofulvin (Bulk) Anaphylaxis and Rash  ?  Throat swelling  ? Penicillins Other (See Comments)  ?  Other Reaction: Fever  ? Sulfa (Sulfonamide Antibiotics) Other (See Comments)  ?  REACTION: joints ache ?Other Reaction: inflammation  ? Diflucan [Fluconazole] Hives and Other (See Comments)  ?  Shingles activated  ? Mometasone-Formoterol Other (See Comments)  ?  Lack of therapeutic effect  ? Retapamulin Rash  ? ?Review of Systems - Negative except as stated in HPI ? ? ?Objective:  ? ? ?Vitals:  ? 03/02/22 0855  ?BP: 120/80  ?Pulse: 65  ?Temp: 37.1 ?C (98.7 ?F)  ?SpO2: 94%  ?Weight: 78.1 kg (172 lb 4 oz)  ?Height: 175.3 cm ('5\' 9"'$ )  ?  ? ? ?General appearance - alert, well appearing, and in no distress ?Chest - clear to auscultation, no wheezes, rales or rhonchi, symmetric air entry ?Heart - normal rate and regular rhythm ?Abdomen -  soft, nontender, nondistended, no masses or organomegaly ? ? ?Labs, Imaging and Diagnostic Testing: ? ?Anoscopy performed 05/11/2021 shows grade 3 hemorrhoids in all 3 locations with minimal inflammation noted. ? ?Assessment and Plan:  ?Diagnoses and all orders for this visit: ? ?Prolapsed internal hemorrhoids, grade 27 ? ?  ?71 year old male with grade 3 hemorrhoids for several years.  I recommended trans hemorrhoidal dearterialization.  We discussed this in detail including risk of bleeding, pain, urinary retention and recurrence.  All questions were answered.  Patient would like to proceed with surgery. ? ?No follow-ups on file. ? ? ? ?Rosario Adie,  MD ?Colon and Rectal Surgery ?[facility]  ?

## 2022-03-02 NOTE — H&P (Signed)
?PROVIDER:  Monico Blitz, MD ? ?MRN: [id number] ?DOB: 1951/03/07 ?DATE OF ENCOUNTER: 03/02/2022 ?Subjective  ? ?Chief Complaint: Follow-up ?  ? ? ?History of Present Illness: ?Tyler [name] is a 71 y.o. male who is seen today for rectal bleeding.  Patient in referral from Dr. Carlean Purl.  He has had rectal bleeding for several years.  He has underwent multiple band ligations with Dr. Carlean Purl in the past and this did not resolve his symptoms.  He continues to have prolapsing hemorrhoids which have to be manually reduced after bowel movements.  Bleeding episodes are occasional.  He reports regular bowel movements and denies any constipation or straining.  Patient was seen in the office in June 2022 and noted to have large grade 3 hemorrhoids in all 3 locations.  A trans hemorrhoidal dearterialization procedure was offered to the patient.  He did not feel that this would fit into his schedule at that time and wanted to postpone surgery. ? ?Past Medical History:  ?Diagnosis Date  ? Asthma without status asthmaticus   ? GERD (gastroesophageal reflux disease)   ? Hypertension   ? ? ?Past Surgical History:  ?Procedure Laterality Date  ? APPENDECTOMY    ? arythenoid cartilage biopsy    ? KNEE ARTHROSCOPY    ? LIGAMENT RECONSTRUCTION & TENDON INTERPOSITION HAND Left   ? TONSILLECTOMY    ? ?Family History  ?Problem Relation Age of Onset  ? Aneurysm Mother   ? ?Social History  ? ?Socioeconomic History  ? Marital status: Single  ?Tobacco Use  ? Smoking status: Former  ?  Packs/day: 0.25  ?  Years: 15.00  ?  Pack years: 3.75  ?  Types: Cigarettes  ?  Quit date: 11/15/1988  ?  Years since quitting: 33.3  ? Smokeless tobacco: Never  ?Substance and Sexual Activity  ? Alcohol use: Yes  ? Drug use: No  ? ?Current Outpatient Medications  ?Medication Instructions  ? albuterol (PROVENTIL HFA,VENTOLIN HFA) 90 mcg/actuation inhaler No dose, route, or frequency recorded.  ? amLODIPine (NORVASC) 5 mg, Oral, Daily  ? azelastine (ASTELIN)  137 mcg nasal spray 2 sprays, 2 times Daily  ? bisoprolol (ZEBETA) 10 MG tablet TK 1 T PO QD  ? hydrocortisone-pramoxine (ANALPRAM-HC) 2.5-1 % rectal cream No dose, route, or frequency recorded.  ? montelukast (SINGULAIR) 10 mg tablet 1 tablet, Nightly  ? NexIUM 20 mg, Oral, Daily  ? tadalafiL (CIALIS) 5 mg, Oral, As needed  ? valacyclovir (VALTREX) 1000 MG tablet No dose, route, or frequency recorded.  ? ?Allergies  ?Allergen Reactions  ? Griseofulvin (Bulk) Anaphylaxis and Rash  ?  Throat swelling  ? Penicillins Other (See Comments)  ?  Other Reaction: Fever  ? Sulfa (Sulfonamide Antibiotics) Other (See Comments)  ?  REACTION: joints ache ?Other Reaction: inflammation  ? Diflucan [Fluconazole] Hives and Other (See Comments)  ?  Shingles activated  ? Mometasone-Formoterol Other (See Comments)  ?  Lack of therapeutic effect  ? Retapamulin Rash  ? ?Review of Systems - Negative except as stated in HPI ? ? ?Objective:  ? ? ?Vitals:  ? 03/02/22 0855  ?BP: 120/80  ?Pulse: 65  ?Temp: 37.1 ?C (98.7 ?F)  ?SpO2: 94%  ?Weight: 78.1 kg (172 lb 4 oz)  ?Height: 175.3 cm ('5\' 9"'$ )  ?  ? ? ?General appearance - alert, well appearing, and in no distress ?Chest - clear to auscultation, no wheezes, rales or rhonchi, symmetric air entry ?Heart - normal rate and regular rhythm ?Abdomen -  soft, nontender, nondistended, no masses or organomegaly ? ? ?Labs, Imaging and Diagnostic Testing: ? ?Anoscopy performed 05/11/2021 shows grade 3 hemorrhoids in all 3 locations with minimal inflammation noted. ? ?Assessment and Plan:  ?Diagnoses and all orders for this visit: ? ?Prolapsed internal hemorrhoids, grade 110 ? ?  ?71 year old male with grade 3 hemorrhoids for several years.  I recommended trans hemorrhoidal dearterialization.  We discussed this in detail including risk of bleeding, pain, urinary retention and recurrence.  All questions were answered.  Patient would like to proceed with surgery. ? ?No follow-ups on file. ? ? ? ?Rosario Adie,  MD ?Colon and Rectal Surgery ?[facility]  ?

## 2022-03-03 ENCOUNTER — Encounter (HOSPITAL_BASED_OUTPATIENT_CLINIC_OR_DEPARTMENT_OTHER): Payer: Self-pay | Admitting: General Surgery

## 2022-03-03 ENCOUNTER — Other Ambulatory Visit: Payer: Self-pay

## 2022-03-03 NOTE — Progress Notes (Signed)
Spoke w/ via phone for pre-op interview--- pt ?Lab needs dos----  [employer] and ekg             ?Lab results------ no ?COVID test -----patient states asymptomatic no test needed ?Arrive at ------- 1000 on 03-05-2022 ?NPO after MN NO Solid Food.  Clear liquids from MN until--- 0900 ?Med rec completed ?Medications to take morning of surgery ----- norvasc, valtrex, nexium ?Diabetic medication ----- n/a ?Patient instructed no nail polish to be worn day of surgery ?Patient instructed to bring photo id and insurance card day of surgery ?Patient aware to have Driver (ride ) / caregiver for 24 hours after surgery --partner, andrew ?Patient Special Instructions ----- asked to bring rescue inhaler ?Pre-Op special Istructions ----- n/a ?Patient verbalized understanding of instructions that were given at this phone interview. ?Patient denies shortness of breath, chest pain, fever, cough at this phone interview.  ?

## 2022-03-05 ENCOUNTER — Encounter (HOSPITAL_BASED_OUTPATIENT_CLINIC_OR_DEPARTMENT_OTHER): Payer: Self-pay | Admitting: General Surgery

## 2022-03-05 ENCOUNTER — Ambulatory Visit (HOSPITAL_BASED_OUTPATIENT_CLINIC_OR_DEPARTMENT_OTHER): Payer: BC Managed Care – PPO | Admitting: Anesthesiology

## 2022-03-05 ENCOUNTER — Ambulatory Visit (HOSPITAL_BASED_OUTPATIENT_CLINIC_OR_DEPARTMENT_OTHER)
Admission: RE | Admit: 2022-03-05 | Discharge: 2022-03-05 | Disposition: A | Discharge status: Home / Self Care | Payer: BC Managed Care – PPO | Attending: General Surgery | Admitting: General Surgery

## 2022-03-05 ENCOUNTER — Other Ambulatory Visit: Payer: Self-pay

## 2022-03-05 ENCOUNTER — Encounter (HOSPITAL_BASED_OUTPATIENT_CLINIC_OR_DEPARTMENT_OTHER)
Admission: RE | Disposition: A | Discharge status: Home / Self Care | Payer: Self-pay | Source: Home / Self Care | Attending: General Surgery

## 2022-03-05 DIAGNOSIS — K642 Third degree hemorrhoids: Secondary | ICD-10-CM | POA: Diagnosis present

## 2022-03-05 DIAGNOSIS — Z79899 Other long term (current) drug therapy: Secondary | ICD-10-CM | POA: Diagnosis not present

## 2022-03-05 DIAGNOSIS — I440 Atrioventricular block, first degree: Secondary | ICD-10-CM | POA: Insufficient documentation

## 2022-03-05 DIAGNOSIS — Z87891 Personal history of nicotine dependence: Secondary | ICD-10-CM | POA: Insufficient documentation

## 2022-03-05 DIAGNOSIS — J45909 Unspecified asthma, uncomplicated: Secondary | ICD-10-CM | POA: Diagnosis not present

## 2022-03-05 DIAGNOSIS — I10 Essential (primary) hypertension: Secondary | ICD-10-CM | POA: Insufficient documentation

## 2022-03-05 DIAGNOSIS — K219 Gastro-esophageal reflux disease without esophagitis: Secondary | ICD-10-CM | POA: Diagnosis not present

## 2022-03-05 HISTORY — DX: Personal history of other malignant neoplasm of skin: Z85.828

## 2022-03-05 HISTORY — DX: Personal history of diseases of the skin and subcutaneous tissue: Z87.2

## 2022-03-05 HISTORY — DX: Presence of spectacles and contact lenses: Z97.3

## 2022-03-05 HISTORY — DX: Unspecified asthma, uncomplicated: J45.909

## 2022-03-05 HISTORY — DX: Gastro-esophageal reflux disease without esophagitis: K21.9

## 2022-03-05 HISTORY — DX: Unspecified osteoarthritis, unspecified site: M19.90

## 2022-03-05 HISTORY — DX: Preglaucoma, unspecified, bilateral: H40.003

## 2022-03-05 HISTORY — DX: Personal history of other malignant neoplasm of skin: Z98.890

## 2022-03-05 HISTORY — PX: TRANSANAL HEMORRHOIDAL DEARTERIALIZATION: SHX6136

## 2022-03-05 LAB — POCT I-STAT, CHEM 8
BUN: 17 mg/dL (ref 8–23)
Calcium, Ion: 1.14 mmol/L — ABNORMAL LOW (ref 1.15–1.40)
Chloride: 102 mmol/L (ref 98–111)
Creatinine, Ser: 0.8 mg/dL (ref 0.61–1.24)
Glucose, Bld: 91 mg/dL (ref 70–99)
HCT: 41 % (ref 39.0–52.0)
Hemoglobin: 13.9 g/dL (ref 13.0–17.0)
Potassium: 4.1 mmol/L (ref 3.5–5.1)
Sodium: 138 mmol/L (ref 135–145)
TCO2: 26 mmol/L (ref 22–32)

## 2022-03-05 SURGERY — TRANSANAL HEMORRHOIDAL DEARTERIALIZATION
Anesthesia: General

## 2022-03-05 MED ORDER — DEXAMETHASONE SODIUM PHOSPHATE 10 MG/ML IJ SOLN
INTRAMUSCULAR | Status: DC | PRN
  Administered 2022-03-05: 10 mg via INTRAVENOUS

## 2022-03-05 MED ORDER — OXYCODONE HCL 5 MG/5ML PO SOLN: 5.0000 mg | Freq: Once | ORAL | Status: DC | PRN

## 2022-03-05 MED ORDER — MIDAZOLAM HCL 2 MG/2ML IJ SOLN
INTRAMUSCULAR | Status: AC
  Filled 2022-03-05: qty 2

## 2022-03-05 MED ORDER — HEMOSTATIC AGENTS (NO CHARGE) OPTIME
TOPICAL | Status: DC | PRN
  Administered 2022-03-05: 1 via TOPICAL

## 2022-03-05 MED ORDER — ONDANSETRON HCL 4 MG/2ML IJ SOLN
INTRAMUSCULAR | Status: DC | PRN
  Administered 2022-03-05: 4 mg via INTRAVENOUS

## 2022-03-05 MED ORDER — LACTATED RINGERS IV SOLN: INTRAVENOUS | Status: DC

## 2022-03-05 MED ORDER — ACETAMINOPHEN 500 MG PO TABS
1000.0000 mg | ORAL_TABLET | Freq: Once | ORAL | Status: AC
  Administered 2022-03-05: 1000 mg via ORAL

## 2022-03-05 MED ORDER — SODIUM CHLORIDE 0.9 % IV SOLN: 250.0000 mL | INTRAVENOUS | Status: DC | PRN

## 2022-03-05 MED ORDER — BUPIVACAINE LIPOSOME 1.3 % IJ SUSP
INTRAMUSCULAR | Status: DC | PRN
Start: 2022-03-05 — End: 2022-03-05
  Administered 2022-03-05: 20 mL

## 2022-03-05 MED ORDER — LIDOCAINE 2% (20 MG/ML) 5 ML SYRINGE
INTRAMUSCULAR | Status: DC | PRN
  Administered 2022-03-05: 100 mg via INTRAVENOUS

## 2022-03-05 MED ORDER — DIAZEPAM 5 MG PO TABS: 5.0000 mg | ORAL_TABLET | Freq: Three times a day (TID) | ORAL | 0 refills | Status: DC | PRN

## 2022-03-05 MED ORDER — ACETAMINOPHEN 325 MG RE SUPP: 650.0000 mg | RECTAL | Status: DC | PRN

## 2022-03-05 MED ORDER — SODIUM CHLORIDE 0.9% FLUSH: 3.0000 mL | Freq: Two times a day (BID) | INTRAVENOUS | Status: DC

## 2022-03-05 MED ORDER — SODIUM CHLORIDE 0.9% FLUSH: 3.0000 mL | INTRAVENOUS | Status: DC | PRN

## 2022-03-05 MED ORDER — DEXAMETHASONE SODIUM PHOSPHATE 10 MG/ML IJ SOLN
INTRAMUSCULAR | Status: AC
  Filled 2022-03-05: qty 1

## 2022-03-05 MED ORDER — FENTANYL CITRATE (PF) 100 MCG/2ML IJ SOLN
INTRAMUSCULAR | Status: AC
  Filled 2022-03-05: qty 2

## 2022-03-05 MED ORDER — EPHEDRINE SULFATE (PRESSORS) 50 MG/ML IJ SOLN
INTRAMUSCULAR | Status: DC | PRN
  Administered 2022-03-05: 5 mg via INTRAVENOUS
  Administered 2022-03-05: 10 mg via INTRAVENOUS

## 2022-03-05 MED ORDER — AMISULPRIDE (ANTIEMETIC) 5 MG/2ML IV SOLN: 10.0000 mg | Freq: Once | INTRAVENOUS | Status: DC | PRN

## 2022-03-05 MED ORDER — PROPOFOL 10 MG/ML IV BOLUS
INTRAVENOUS | Status: AC
  Filled 2022-03-05: qty 20

## 2022-03-05 MED ORDER — ACETAMINOPHEN 325 MG PO TABS: 650.0000 mg | ORAL_TABLET | ORAL | Status: DC | PRN

## 2022-03-05 MED ORDER — MIDAZOLAM HCL 5 MG/5ML IJ SOLN
INTRAMUSCULAR | Status: DC | PRN
  Administered 2022-03-05: 2 mg via INTRAVENOUS

## 2022-03-05 MED ORDER — OXYCODONE HCL 5 MG PO TABS
5.0000 mg | ORAL_TABLET | Freq: Four times a day (QID) | ORAL | 0 refills | Status: DC | PRN
Start: 2022-03-05 — End: 2022-04-09

## 2022-03-05 MED ORDER — ACETAMINOPHEN 500 MG PO TABS
ORAL_TABLET | ORAL | Status: AC
  Filled 2022-03-05: qty 2

## 2022-03-05 MED ORDER — BUPIVACAINE-EPINEPHRINE 0.5% -1:200000 IJ SOLN
INTRAMUSCULAR | Status: DC | PRN
  Administered 2022-03-05: 30 mL

## 2022-03-05 MED ORDER — ONDANSETRON HCL 4 MG/2ML IJ SOLN: 4.0000 mg | Freq: Once | INTRAMUSCULAR | Status: DC | PRN

## 2022-03-05 MED ORDER — LIDOCAINE HCL (PF) 2 % IJ SOLN
INTRAMUSCULAR | Status: AC
  Filled 2022-03-05: qty 5

## 2022-03-05 MED ORDER — 0.9 % SODIUM CHLORIDE (POUR BTL) OPTIME
TOPICAL | Status: DC | PRN
  Administered 2022-03-05: 500 mL

## 2022-03-05 MED ORDER — FENTANYL CITRATE (PF) 100 MCG/2ML IJ SOLN: 25.0000 ug | INTRAMUSCULAR | Status: DC | PRN

## 2022-03-05 MED ORDER — OXYCODONE HCL 5 MG PO TABS: 5.0000 mg | ORAL_TABLET | Freq: Once | ORAL | Status: DC | PRN

## 2022-03-05 MED ORDER — PROPOFOL 10 MG/ML IV BOLUS
INTRAVENOUS | Status: DC | PRN
  Administered 2022-03-05: 200 mg via INTRAVENOUS

## 2022-03-05 MED ORDER — OXYCODONE HCL 5 MG PO TABS: 5.0000 mg | ORAL_TABLET | ORAL | Status: DC | PRN

## 2022-03-05 MED ORDER — FENTANYL CITRATE (PF) 100 MCG/2ML IJ SOLN
INTRAMUSCULAR | Status: DC | PRN
  Administered 2022-03-05 (×2): 25 ug via INTRAVENOUS
  Administered 2022-03-05: 50 ug via INTRAVENOUS

## 2022-03-05 SURGICAL SUPPLY — 36 items
COVER BACK TABLE 60X90IN (DRAPES) ×2 IMPLANT
DRAPE HYSTEROSCOPY (MISCELLANEOUS) ×2 IMPLANT
DRAPE SHEET LG 3/4 BI-LAMINATE (DRAPES) ×2 IMPLANT
DRSG PAD ABDOMINAL 8X10 ST (GAUZE/BANDAGES/DRESSINGS) IMPLANT
ELECT REM PT RETURN 9FT ADLT (ELECTROSURGICAL) ×2
ELECTRODE REM PT RTRN 9FT ADLT (ELECTROSURGICAL) ×1 IMPLANT
GAUZE 4X4 16PLY ~~LOC~~+RFID DBL (SPONGE) ×2 IMPLANT
GAUZE PAD ABD 8X10 STRL (GAUZE/BANDAGES/DRESSINGS) ×1 IMPLANT
GAUZE SPONGE 4X4 12PLY STRL (GAUZE/BANDAGES/DRESSINGS) IMPLANT
GAUZE SPONGE 4X4 3PLY NS LF (GAUZE/BANDAGES/DRESSINGS) ×1 IMPLANT
GLOVE BIO SURGEON STRL SZ 6.5 (GLOVE) ×2 IMPLANT
GLOVE BIOGEL PI IND STRL 7.0 (GLOVE) ×1 IMPLANT
GLOVE BIOGEL PI INDICATOR 7.0 (GLOVE) ×1
GLOVE SURG UNDER LTX SZ6.5 (GLOVE) ×2 IMPLANT
GOWN STRL REUS W/TWL XL LVL3 (GOWN DISPOSABLE) ×2 IMPLANT
HEMOSTAT SURGICEL 4X8 (HEMOSTASIS) IMPLANT
KIT SIGMOIDOSCOPE (SET/KITS/TRAYS/PACK) IMPLANT
KIT SLIDE ONE PROLAPS HEMORR (KITS) ×1 IMPLANT
KIT TURNOVER CYSTO (KITS) ×2 IMPLANT
LEGGING LITHOTOMY PAIR STRL (DRAPES) ×2 IMPLANT
LUBRICANT JELLY K Y 4OZ (MISCELLANEOUS) ×2 IMPLANT
NEEDLE HYPO 22GX1.5 SAFETY (NEEDLE) ×2 IMPLANT
PACK BASIN DAY SURGERY FS (CUSTOM PROCEDURE TRAY) ×2 IMPLANT
PAD ARMBOARD 7.5X6 YLW CONV (MISCELLANEOUS) IMPLANT
PANTS MESH DISP LRG (UNDERPADS AND DIAPERS) IMPLANT
PANTS MESH DISPOSABLE L (UNDERPADS AND DIAPERS)
PENCIL SMOKE EVACUATOR (MISCELLANEOUS) ×2 IMPLANT
SPONGE HEMORRHOID 8X3CM (HEMOSTASIS) ×1 IMPLANT
SUT CHROMIC 2 0 SH (SUTURE) IMPLANT
SUT CHROMIC 3 0 SH 27 (SUTURE) IMPLANT
SUT VIC AB 2-0 UR6 27 (SUTURE) IMPLANT
SYR CONTROL 10ML LL (SYRINGE) ×2 IMPLANT
TOWEL OR 17X26 10 PK STRL BLUE (TOWEL DISPOSABLE) ×2 IMPLANT
TRAY DSU PREP LF (CUSTOM PROCEDURE TRAY) ×2 IMPLANT
TUBE CONNECTING 12X1/4 (SUCTIONS) ×2 IMPLANT
YANKAUER SUCT BULB TIP NO VENT (SUCTIONS) ×2 IMPLANT

## 2022-03-05 NOTE — Anesthesia Postprocedure Evaluation (Signed)
Anesthesia Post Note ? ?Patient: Tyler [name] ? ?Procedure(s) Performed: TRANSANAL HEMORRHOIDAL DEARTERIALIZATION ? ?  ? ?Patient location during evaluation: PACU ?Anesthesia Type: General ?Level of consciousness: awake ?Pain management: pain level controlled ?Vital Signs Assessment: post-procedure vital signs reviewed and stable ?Respiratory status: spontaneous breathing and respiratory function stable ?Cardiovascular status: stable ?Postop Assessment: no apparent nausea or vomiting ?Anesthetic complications: no ? ? ?No notable events documented. ? ?Last Vitals:  ?Vitals:  ? 03/05/22 1245 03/05/22 1300  ?BP: 126/75 125/81  ?Pulse: 68 68  ?Resp: 19 15  ?Temp:    ?SpO2: 96% 97%  ?  ?Last Pain:  ?Vitals:  ? 03/05/22 1300  ?TempSrc:   ?PainSc: 0-No pain  ? ? ?  ?  ?  ?  ?  ?  ? ?Merlinda Frederick ? ? ? ? ?

## 2022-03-05 NOTE — Anesthesia Preprocedure Evaluation (Addendum)
Anesthesia Evaluation  ?Patient identified by MRN, date of birth, ID band ?Patient awake ? ? ? ?Reviewed: ?Allergy & Precautions, NPO status , Patient's Chart, lab work & pertinent test results ? ?Airway ?Mallampati: II ? ?TM Distance: >3 FB ?Neck ROM: Full ? ? ? Dental ?no notable dental hx. ? ?  ?Pulmonary ?asthma , former smoker,  ?  ?Pulmonary exam normal ?breath sounds clear to auscultation ? ? ? ? ? ? Cardiovascular ?Exercise Tolerance: Good ?hypertension, Pt. on home beta blockers and Pt. on medications ?Normal cardiovascular exam ?Rhythm:Regular Rate:Normal ? ?Sinus rhythm with 1st degree A-V block ?Otherwise normal ECG ?When compared with ECG of 19-May-2018 14:06, ?PREVIOUS ECG IS PRESENT ?  ?Neuro/Psych ?negative neurological ROS ? negative psych ROS  ? GI/Hepatic ?GERD  ,(+)  ?  ? substance abuse ? alcohol use,   ?Endo/Other  ?negative endocrine ROS ? Renal/GU ?negative Renal ROS  ?negative genitourinary ?  ?Musculoskeletal ? ?(+) Arthritis ,  ? Abdominal ?  ?Peds ?negative pediatric ROS ?(+)  Hematology ?negative hematology ROS ?(+)   ?Anesthesia Other Findings ? ? Reproductive/Obstetrics ?negative OB ROS ? ?  ? ? ? ? ? ? ? ? ? ? ? ? ? ?  ?  ? ? ? ? ? ? ? ?Anesthesia Physical ?Anesthesia Plan ? ?ASA: 2 ? ?Anesthesia Plan: General  ? ?Post-op Pain Management:   ? ?Induction: Intravenous ? ?PONV Risk Score and Plan: 2 and Treatment may vary due to age or medical condition, Midazolam, Ondansetron and Dexamethasone ? ?Airway Management Planned: LMA ? ?Additional Equipment: None ? ?Intra-op Plan:  ? ?Post-operative Plan: Extubation in OR ? ?Informed Consent: I have reviewed the patients History and Physical, chart, labs and discussed the procedure including the risks, benefits and alternatives for the proposed anesthesia with the patient or authorized representative who has indicated his/her understanding and acceptance.  ? ? ? ?Dental advisory given ? ?Plan Discussed with:  CRNA, Anesthesiologist and Surgeon ? 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 Norton Blizzard, MD  ?)  ? ? ? ? ? ?Anesthesia Quick Evaluation ? ?

## 2022-03-05 NOTE — Discharge Instructions (Addendum)
ANORECTAL SURGERY: POST OP INSTRUCTIONS ?Take your usually prescribed home medications unless otherwise directed. ?DIET: During the first few hours after surgery sip on some liquids until you are able to urinate.  It is normal to not urinate for several hours after this surgery.  If you feel uncomfortable, please contact the office for instructions.  After you are able to urinate,you may eat, if you feel like it.  Follow a light bland diet the first 24 hours after arrival home, such as soup, liquids, crackers, etc.  Be sure to include lots of fluids daily (6-8 glasses).  Avoid fast food or heavy meals, as your are more likely to get nauseated.  Eat a low fat diet the next few days after surgery.  Limit caffeine intake to 1-2 servings a day. ?PAIN CONTROL: ?Pain is best controlled by a usual combination of several different methods TOGETHER: ?Muscle relaxation ? Soak in a warm bath (or Sitz bath) three times a day and after bowel movements.  Continue to do this until all pain is resolved. ?Take the muscle relaxer (Valium) every 6 hours as needed (inability to uriniate, pelvic cramping) for the first 2 days after surgery.  Do not take at the same time as the pain meds. ?Over the counter pain medication ?Prescription pain medication ?Most patients will experience some swelling and discomfort in the anus/rectal area and incisions.  Heat such as warm towels, sitz baths, warm baths, etc to help relax tight/sore spots and speed recovery.  Some people prefer to use ice, especially in the first couple days after surgery, as it may decrease the pain and swelling, or alternate between ice & heat.  Experiment to what works for you.  Swelling and bruising can take several weeks to resolve.  Pain can take even longer to completely resolve. ?It is helpful to take an over-the-counter pain medication regularly for the first few weeks.  Choose one of the following that works best for you: ?Naproxen (Aleve, etc)  Two '220mg'$  tabs twice  a day ?Ibuprofen (Advil, etc) Three '200mg'$  tabs four times a day (every meal & bedtime) ?A  prescription for pain medication (such as percocet, oxycodone, hydrocodone, etc) should be given to you upon discharge.  Take your pain medication as prescribed.  ?If you are having problems/concerns with the prescription medicine (does not control pain, nausea, vomiting, rash, itching, etc), please call us [phone] to see if we need to switch you to a different pain medicine that will work better for you and/or control your side effect better. ?If you need a refill on your pain medication, please contact your pharmacy.  They will contact our office to request authorization. Prescriptions will not be filled after 5 pm or on week-ends. ?KEEP YOUR BOWELS REGULAR and AVOID CONSTIPATION ?The goal is one to two soft bowel movements a day.  You should at least have a bowel movement every other day. ?Avoid getting constipated.  Between the surgery and the pain medications, it is common to experience some constipation. This can be very painful after rectal surgery.  Increasing fluid intake and taking a fiber supplement (such as Metamucil, Citrucel, FiberCon, etc) 1-2 times a day regularly will usually help prevent this problem from occurring.  A stool softener like colace is also recommended.  This can be purchased over the counter at your pharmacy.  You can take it up to 3 times a day.  If you do not have a bowel movement after 24 hrs since your surgery, take  one does of milk of magnesia.  If you still haven't had a bowel movement 8-12 hours after that dose, take another dose.  If you don't have a bowel movement 48 hrs after surgery, purchase a Fleets enema from the drug store and administer gently per package instructions.  If you still are having trouble with your bowel movements after that, please call the office for further instructions. ?If you develop diarrhea or have many loose bowel movements, simplify your diet to  bland foods & liquids for a few days.  Stop any stool softeners and decrease your fiber supplement.  Switching to mild anti-diarrheal medications (Kayopectate, Pepto Bismol) can help.  If this worsens or does not improve, please call us. ? ?Wound Care ?Remove your bandages before your first bowel movement or 8 hours after surgery.     ?Remove any wound packing material at this tim,e as well.  You do not need to repack the wound unless instructed otherwise.  Wear an absorbent pad or soft cotton gauze in your underwear to catch any drainage and help keep the area clean. You should change this every 2-3 hours while awake. ?Keep the area clean and dry.  Bathe / shower every day, especially after bowel movements.  Keep the area clean by showering / bathing over the incision / wound.   It is okay to soak an open wound to help wash it.  Wet wipes or showers / gentle washing after bowel movements is often less traumatic than regular toilet paper. ?You may have some styrofoam-like soft packing in the rectum which will come out with the first bowel movement.  ?You will often notice bleeding with bowel movements.  This should slow down by the end of the first week of surgery ?Expect some drainage.  This should slow down, too, by the end of the first week of surgery.  Wear an absorbent pad or soft cotton gauze in your underwear until the drainage stops. ?Do Not sit on a rubber or pillow ring.  This can make you symptoms worse.  You may sit on a soft pillow if needed.  ?ACTIVITIES as tolerated:   ?You may resume regular (light) daily activities beginning the next day--such as daily self-care, walking, climbing stairs--gradually increasing activities as tolerated.  If you can walk 30 minutes without difficulty, it is safe to try more intense activity such as jogging, treadmill, bicycling, low-impact aerobics, swimming, etc. ?Save the most intensive and strenuous activity for last such as sit-ups, heavy lifting, contact sports,  etc  Refrain from any heavy lifting or straining until you are off narcotics for pain control.   ?You may drive when you are no longer taking prescription pain medication, you can comfortably sit for long periods of time, and you can safely maneuver your car and apply brakes. ?You may have sexual intercourse when it is comfortable.  ?FOLLOW UP in our office ?Please call [facility] at [phone] to set up an appointment to see your surgeon in the office for a follow-up appointment approximately 3-4 weeks after your surgery. ?Make sure that you call for this appointment the day you arrive home to insure a convenient appointment time. ?10. IF YOU HAVE DISABILITY OR FAMILY LEAVE FORMS, BRING THEM TO THE OFFICE FOR PROCESSING.  DO NOT GIVE THEM TO YOUR DOCTOR. ? ? ? ? ?WHEN TO CALL us [phone]: ?Poor pain control ?Reactions / problems with new medications (rash/itching, nausea, etc)  ?Fever over 101.5 F (38.5 C) ?Inability to urinate ?  Nausea and/or vomiting ?Worsening swelling or bruising ?Continued bleeding from incision. ?Increased pain, redness, or drainage from the incision ? ?The clinic staff is available to answer your questions during regular business hours (8:30am-5pm).  Please don?t hesitate to call and ask to speak to one of our nurses for clinical concerns.   A surgeon from [facility] is always on call at the hospitals ?  ?If you have a medical emergency, go to the nearest emergency room or call 911. ?  ? ?[facility], [place] ?[address], [place], [place], [place]  [address] ? ?MAIN: [phone] ? TOLL FREE: [phone] ? ?FAX [phone] ?[email or web address] ? ?  ?Post Anesthesia Home Care Instructions ? ?Activity: ?Get plenty of rest for the remainder of the day. A responsible adult should stay with you for 24 hours following the procedure.  ?For the next 24 hours, DO NOT: ?-Drive a car ?-Paediatric nurse ?-Drink alcoholic beverages ?-Take any  medication unless instructed by your physician ?-Make any legal decisions or sign important papers. ? ?Meals: ?Start with liquid foods such as gelatin or soup. Progress to regular foods as tolerated. Avoid greasy,

## 2022-03-05 NOTE — Interval H&P Note (Signed)
History and Physical Interval Note: ? ?03/05/2022 ?9:56 AM ? ?Tyler [name]  has presented today for surgery, with the diagnosis of GRADE 3 HEMORRHOIDS.  The various methods of treatment have been discussed with the patient and family. After consideration of risks, benefits and other options for treatment, the patient has consented to  Procedure(s): ?TRANSANAL HEMORRHOIDAL DEARTERIALIZATION (N/A) as a surgical intervention.  The patient's history has been reviewed, patient examined, no change in status, stable for surgery.  I have reviewed the patient's chart and labs.  Questions were answered to the patient's satisfaction.   ? ? ?Rosario Adie, MD ? ?Colorectal and General Surgery ?[facility]  ? ? ?

## 2022-03-05 NOTE — Op Note (Signed)
03/05/2022 ? ?12:22 PM ? ?PATIENT:  Tyler [name]  71 y.o. male ? ?Patient Care Team: ?Cari Caraway, MD as PCP - General (Family Medicine) ? ?PRE-OPERATIVE DIAGNOSIS:  GRADE 3 HEMORRHOIDS ? ?POST-OPERATIVE DIAGNOSIS:  GRADE 3 HEMORRHOIDS ? ?PROCEDURE:  TRANSANAL HEMORRHOIDAL DEARTERIALIZATION ? ?  Surgeon(s): ?Leighton Ruff, MD ? ?ASSISTANT: none  ? ?ANESTHESIA:   local and MAC ? ?EBL:  Total I/O ?In: 400 [I.V.:400] ?Out: -  ? ?DRAINS: none  ? ?SPECIMEN:  No Specimen ? ?DISPOSITION OF SPECIMEN:  N/A ? ?COUNTS:  YES ? ?PLAN OF CARE: Discharge to home after PACU ? ?PATIENT DISPOSITION:  PACU - hemodynamically stable. ? ?INDICATION: 71 y.o. M with large grade 3 hemorrhoids, not amenable to rubber band ligation ? ? ?OR FINDINGS: Grade 3 hemorrhoids at all 3 locations ? ?Description: Informed consent was confirmed. Patient underwent general anesthesia without difficulty. Patient was placed into lithotomy positioning.  The perianal region was prepped and draped in sterile fashion. Surgical time out confirmed or plan. ? ?I did digital rectal examination and then transitioned over to anoscopy to get a sense of the anatomy.  I switched over to the [facility] fiberoptically lit Doppler anocope.   Using the Doppler on the tip of the [place] anoscope, I identified the arterial hemorrhoidal vessels coming in in the classic hexagonal anatomical pattern (right posterior/lateral/anterior, left posterior /lateral/anterior).   ? ?I proceeded to ligate the hemorrhoidal arteries. I used a 2-0 Vicryl suture on a UR-6 needle in a figure-of-eight fashion over the signal around 6 cm proximal to the anal verge. I then ran that stitch longitudinally more distally to the dentate line. I then tied that stitch down to cause a hemorrhoidopexy. I did that for all 6 locations.   ? ?I redid Doppler anoscopy. No other signals could be identified.  At completion of this, all hemorrhoids were reduced into the rectum.  There is no more prolapse. External  anatomy looked normal. ? ?I repeated anoscopy and examination.  Hemostasis was good. I placed a soft Gelfoam cylinder into the rectum. Patient is being extubated go to recovery room. ? ?I am about to discuss the patient's status to the family.   ? ?Rosario Adie, MD ? ?Colorectal and General Surgery ?[facility]  ?

## 2022-03-05 NOTE — Transfer of Care (Signed)
Immediate Anesthesia Transfer of Care Note ? ?Patient: Tyler [name] ? ?Procedure(s) Performed: TRANSANAL HEMORRHOIDAL DEARTERIALIZATION ? ?Patient Location: PACU ? ?Anesthesia Type:General ? ?Level of Consciousness: awake, alert , oriented and patient cooperative ? ?Airway & Oxygen Therapy: Patient Spontanous Breathing and Patient connected to nasal cannula oxygen ? ?Post-op Assessment: Report given to RN and Post -op Vital signs reviewed and stable ? ?Post vital signs: Reviewed and stable ? ?Last Vitals:  ?Vitals Value Taken Time  ?BP 122/78 03/05/22 1231  ?Temp    ?Pulse 88 03/05/22 1232  ?Resp 22 03/05/22 1232  ?SpO2 99 % 03/05/22 1232  ?Vitals shown include unvalidated device data. ? ?Last Pain:  ?Vitals:  ? 03/05/22 1038  ?TempSrc: Oral  ?PainSc: 0-No pain  ?   ? ?Patients Stated Pain Goal: 1 (03/05/22 1038) ? ?Complications: No notable events documented. ?

## 2022-03-05 NOTE — Anesthesia Procedure Notes (Signed)
Procedure Name: LMA Insertion ?Date/Time: 03/05/2022 11:33 AM ?Performed by: Georgeanne Nim, CRNA ?Pre-anesthesia Checklist: Patient identified, Emergency Drugs available, Suction available, Patient being monitored and Timeout performed ?Patient Re-evaluated:Patient Re-evaluated prior to induction ?Oxygen Delivery Method: Circle system utilized ?Preoxygenation: Pre-oxygenation with 100% oxygen ?Induction Type: IV induction ?Ventilation: Mask ventilation without difficulty ?LMA: LMA inserted ?LMA Size: 4.0 ?Number of attempts: 1 ?Placement Confirmation: positive ETCO2, CO2 detector and breath sounds checked- equal and bilateral ?Tube secured with: Tape ?Dental Injury: Teeth and Oropharynx as per pre-operative assessment  ? ? ? ? ?

## 2022-03-09 ENCOUNTER — Other Ambulatory Visit: Payer: Self-pay

## 2022-03-09 ENCOUNTER — Emergency Department (HOSPITAL_COMMUNITY): Payer: BC Managed Care – PPO

## 2022-03-09 ENCOUNTER — Encounter (HOSPITAL_BASED_OUTPATIENT_CLINIC_OR_DEPARTMENT_OTHER): Payer: Self-pay | Admitting: General Surgery

## 2022-03-09 ENCOUNTER — Inpatient Hospital Stay (HOSPITAL_COMMUNITY)
Admission: EM | Admit: 2022-03-09 | Discharge: 2022-04-09 | DRG: 329 | Disposition: A | Discharge status: Rehabilitation Facility | Payer: BC Managed Care – PPO | Attending: General Surgery | Admitting: General Surgery

## 2022-03-09 DIAGNOSIS — N5089 Other specified disorders of the male genital organs: Secondary | ICD-10-CM | POA: Diagnosis not present

## 2022-03-09 DIAGNOSIS — I10 Essential (primary) hypertension: Secondary | ICD-10-CM | POA: Diagnosis present

## 2022-03-09 DIAGNOSIS — I96 Gangrene, not elsewhere classified: Secondary | ICD-10-CM | POA: Diagnosis present

## 2022-03-09 DIAGNOSIS — A419 Sepsis, unspecified organism: Secondary | ICD-10-CM

## 2022-03-09 DIAGNOSIS — K610 Anal abscess: Secondary | ICD-10-CM | POA: Diagnosis present

## 2022-03-09 DIAGNOSIS — E876 Hypokalemia: Secondary | ICD-10-CM

## 2022-03-09 DIAGNOSIS — L24A9 Irritant contact dermatitis due friction or contact with other specified body fluids: Secondary | ICD-10-CM | POA: Diagnosis not present

## 2022-03-09 DIAGNOSIS — E860 Dehydration: Secondary | ICD-10-CM | POA: Diagnosis present

## 2022-03-09 DIAGNOSIS — A4181 Sepsis due to Enterococcus: Secondary | ICD-10-CM

## 2022-03-09 DIAGNOSIS — G4700 Insomnia, unspecified: Secondary | ICD-10-CM | POA: Diagnosis not present

## 2022-03-09 DIAGNOSIS — R502 Drug induced fever: Secondary | ICD-10-CM | POA: Diagnosis not present

## 2022-03-09 DIAGNOSIS — Z933 Colostomy status: Secondary | ICD-10-CM

## 2022-03-09 DIAGNOSIS — Z20822 Contact with and (suspected) exposure to covid-19: Secondary | ICD-10-CM | POA: Diagnosis present

## 2022-03-09 DIAGNOSIS — J4530 Mild persistent asthma, uncomplicated: Secondary | ICD-10-CM | POA: Diagnosis present

## 2022-03-09 DIAGNOSIS — Z83511 Family history of glaucoma: Secondary | ICD-10-CM

## 2022-03-09 DIAGNOSIS — R188 Other ascites: Secondary | ICD-10-CM | POA: Diagnosis present

## 2022-03-09 DIAGNOSIS — D62 Acute posthemorrhagic anemia: Secondary | ICD-10-CM | POA: Insufficient documentation

## 2022-03-09 DIAGNOSIS — K651 Peritoneal abscess: Secondary | ICD-10-CM

## 2022-03-09 DIAGNOSIS — K4090 Unilateral inguinal hernia, without obstruction or gangrene, not specified as recurrent: Secondary | ICD-10-CM | POA: Diagnosis present

## 2022-03-09 DIAGNOSIS — J310 Chronic rhinitis: Secondary | ICD-10-CM | POA: Diagnosis present

## 2022-03-09 DIAGNOSIS — R339 Retention of urine, unspecified: Secondary | ICD-10-CM | POA: Diagnosis not present

## 2022-03-09 DIAGNOSIS — K9189 Other postprocedural complications and disorders of digestive system: Secondary | ICD-10-CM

## 2022-03-09 DIAGNOSIS — B9620 Unspecified Escherichia coli [E. coli] as the cause of diseases classified elsewhere: Secondary | ICD-10-CM | POA: Diagnosis present

## 2022-03-09 DIAGNOSIS — R102 Pelvic and perineal pain: Secondary | ICD-10-CM | POA: Diagnosis present

## 2022-03-09 DIAGNOSIS — J45909 Unspecified asthma, uncomplicated: Secondary | ICD-10-CM | POA: Diagnosis present

## 2022-03-09 DIAGNOSIS — E43 Unspecified severe protein-calorie malnutrition: Secondary | ICD-10-CM

## 2022-03-09 DIAGNOSIS — L309 Dermatitis, unspecified: Secondary | ICD-10-CM | POA: Diagnosis present

## 2022-03-09 DIAGNOSIS — G8918 Other acute postprocedural pain: Secondary | ICD-10-CM

## 2022-03-09 DIAGNOSIS — M1990 Unspecified osteoarthritis, unspecified site: Secondary | ICD-10-CM | POA: Diagnosis present

## 2022-03-09 DIAGNOSIS — I951 Orthostatic hypotension: Secondary | ICD-10-CM | POA: Diagnosis not present

## 2022-03-09 DIAGNOSIS — T8143XA Infection following a procedure, organ and space surgical site, initial encounter: Secondary | ICD-10-CM

## 2022-03-09 DIAGNOSIS — K631 Perforation of intestine (nontraumatic): Secondary | ICD-10-CM | POA: Diagnosis not present

## 2022-03-09 DIAGNOSIS — F418 Other specified anxiety disorders: Secondary | ICD-10-CM

## 2022-03-09 DIAGNOSIS — L02419 Cutaneous abscess of limb, unspecified: Secondary | ICD-10-CM

## 2022-03-09 DIAGNOSIS — J9811 Atelectasis: Secondary | ICD-10-CM | POA: Diagnosis not present

## 2022-03-09 DIAGNOSIS — D649 Anemia, unspecified: Secondary | ICD-10-CM

## 2022-03-09 DIAGNOSIS — R109 Unspecified abdominal pain: Secondary | ICD-10-CM | POA: Diagnosis present

## 2022-03-09 DIAGNOSIS — K219 Gastro-esophageal reflux disease without esophagitis: Secondary | ICD-10-CM | POA: Diagnosis present

## 2022-03-09 DIAGNOSIS — E8770 Fluid overload, unspecified: Secondary | ICD-10-CM | POA: Diagnosis not present

## 2022-03-09 DIAGNOSIS — N170 Acute kidney failure with tubular necrosis: Secondary | ICD-10-CM | POA: Diagnosis not present

## 2022-03-09 DIAGNOSIS — J9601 Acute respiratory failure with hypoxia: Secondary | ICD-10-CM | POA: Diagnosis not present

## 2022-03-09 DIAGNOSIS — R338 Other retention of urine: Secondary | ICD-10-CM

## 2022-03-09 DIAGNOSIS — E872 Acidosis, unspecified: Secondary | ICD-10-CM

## 2022-03-09 DIAGNOSIS — Z85828 Personal history of other malignant neoplasm of skin: Secondary | ICD-10-CM

## 2022-03-09 DIAGNOSIS — E874 Mixed disorder of acid-base balance: Secondary | ICD-10-CM | POA: Diagnosis not present

## 2022-03-09 DIAGNOSIS — R739 Hyperglycemia, unspecified: Secondary | ICD-10-CM | POA: Diagnosis present

## 2022-03-09 DIAGNOSIS — B952 Enterococcus as the cause of diseases classified elsewhere: Secondary | ICD-10-CM | POA: Diagnosis present

## 2022-03-09 DIAGNOSIS — Z79899 Other long term (current) drug therapy: Secondary | ICD-10-CM

## 2022-03-09 DIAGNOSIS — Z888 Allergy status to other drugs, medicaments and biological substances status: Secondary | ICD-10-CM

## 2022-03-09 DIAGNOSIS — R32 Unspecified urinary incontinence: Secondary | ICD-10-CM | POA: Diagnosis not present

## 2022-03-09 DIAGNOSIS — Z882 Allergy status to sulfonamides status: Secondary | ICD-10-CM

## 2022-03-09 DIAGNOSIS — F05 Delirium due to known physiological condition: Secondary | ICD-10-CM | POA: Diagnosis not present

## 2022-03-09 DIAGNOSIS — B029 Zoster without complications: Secondary | ICD-10-CM | POA: Diagnosis present

## 2022-03-09 DIAGNOSIS — Z880 Allergy status to penicillin: Secondary | ICD-10-CM

## 2022-03-09 DIAGNOSIS — D638 Anemia in other chronic diseases classified elsewhere: Secondary | ICD-10-CM | POA: Diagnosis present

## 2022-03-09 DIAGNOSIS — Z87891 Personal history of nicotine dependence: Secondary | ICD-10-CM

## 2022-03-09 DIAGNOSIS — N529 Male erectile dysfunction, unspecified: Secondary | ICD-10-CM | POA: Diagnosis present

## 2022-03-09 DIAGNOSIS — E878 Other disorders of electrolyte and fluid balance, not elsewhere classified: Secondary | ICD-10-CM | POA: Diagnosis not present

## 2022-03-09 DIAGNOSIS — Z9049 Acquired absence of other specified parts of digestive tract: Secondary | ICD-10-CM

## 2022-03-09 DIAGNOSIS — K642 Third degree hemorrhoids: Secondary | ICD-10-CM | POA: Diagnosis present

## 2022-03-09 DIAGNOSIS — Z881 Allergy status to other antibiotic agents status: Secondary | ICD-10-CM

## 2022-03-09 DIAGNOSIS — K567 Ileus, unspecified: Secondary | ICD-10-CM | POA: Diagnosis not present

## 2022-03-09 HISTORY — DX: Sepsis due to Enterococcus: A41.81

## 2022-03-09 HISTORY — DX: Infection following a procedure, organ and space surgical site, initial encounter: T81.43XA

## 2022-03-09 LAB — CBC WITH DIFFERENTIAL/PLATELET
Abs Immature Granulocytes: 0 10*3/uL (ref 0.00–0.07)
Band Neutrophils: 8 %
Basophils Absolute: 0 10*3/uL (ref 0.0–0.1)
Basophils Relative: 0 %
Eosinophils Absolute: 0 10*3/uL (ref 0.0–0.5)
Eosinophils Relative: 0 %
HCT: 43.2 % (ref 39.0–52.0)
Hemoglobin: 15.2 g/dL (ref 13.0–17.0)
Lymphocytes Relative: 2 %
Lymphs Abs: 0.2 10*3/uL — ABNORMAL LOW (ref 0.7–4.0)
MCH: 34.5 pg — ABNORMAL HIGH (ref 26.0–34.0)
MCHC: 35.2 g/dL (ref 30.0–36.0)
MCV: 98.2 fL (ref 80.0–100.0)
Monocytes Absolute: 0.7 10*3/uL (ref 0.1–1.0)
Monocytes Relative: 7 %
Neutro Abs: 9.2 10*3/uL — ABNORMAL HIGH (ref 1.7–7.7)
Neutrophils Relative %: 83 %
Platelets: 139 10*3/uL — ABNORMAL LOW (ref 150–400)
RBC: 4.4 MIL/uL (ref 4.22–5.81)
RDW: 12.8 % (ref 11.5–15.5)
WBC: 10.1 10*3/uL (ref 4.0–10.5)
nRBC: 0 % (ref 0.0–0.2)

## 2022-03-09 LAB — COMPREHENSIVE METABOLIC PANEL
ALT: 54 U/L — ABNORMAL HIGH (ref 0–44)
AST: 40 U/L (ref 15–41)
Albumin: 3.4 g/dL — ABNORMAL LOW (ref 3.5–5.0)
Alkaline Phosphatase: 87 U/L (ref 38–126)
Anion gap: 16 — ABNORMAL HIGH (ref 5–15)
BUN: 27 mg/dL — ABNORMAL HIGH (ref 8–23)
CO2: 18 mmol/L — ABNORMAL LOW (ref 22–32)
Calcium: 9.1 mg/dL (ref 8.9–10.3)
Chloride: 98 mmol/L (ref 98–111)
Creatinine, Ser: 1.27 mg/dL — ABNORMAL HIGH (ref 0.61–1.24)
GFR, Estimated: >60 mL/min (ref >60)
Glucose, Bld: 87 mg/dL (ref 70–99)
Potassium: 3.4 mmol/L — ABNORMAL LOW (ref 3.5–5.1)
Sodium: 132 mmol/L — ABNORMAL LOW (ref 135–145)
Total Bilirubin: 3.1 mg/dL — ABNORMAL HIGH (ref 0.3–1.2)
Total Protein: 6.5 g/dL (ref 6.5–8.1)

## 2022-03-09 LAB — RESP PANEL BY RT-PCR (FLU A&B, COVID) ARPGX2
Influenza A by PCR: NEGATIVE
Influenza B by PCR: NEGATIVE
SARS Coronavirus 2 by RT PCR: NEGATIVE

## 2022-03-09 LAB — LIPASE, BLOOD: Lipase: 22 U/L (ref 11–51)

## 2022-03-09 MED ORDER — ONDANSETRON 4 MG PO TBDP: 4.0000 mg | ORAL_TABLET | Freq: Four times a day (QID) | ORAL | Status: DC | PRN

## 2022-03-09 MED ORDER — METRONIDAZOLE 500 MG/100ML IV SOLN
500.0000 mg | Freq: Two times a day (BID) | INTRAVENOUS | Status: DC
  Administered 2022-03-09: 500 mg via INTRAVENOUS
  Filled 2022-03-09: qty 100

## 2022-03-09 MED ORDER — SODIUM CHLORIDE 0.9 % IV SOLN
2.0000 g | Freq: Once | INTRAVENOUS | Status: AC
  Administered 2022-03-09: 2 g via INTRAVENOUS
  Filled 2022-03-09: qty 12.5

## 2022-03-09 MED ORDER — SODIUM CHLORIDE 0.9 % IV SOLN
2.0000 g | Freq: Two times a day (BID) | INTRAVENOUS | Status: DC
  Administered 2022-03-10 – 2022-03-14 (×9): 2 g via INTRAVENOUS
  Filled 2022-03-09 (×9): qty 12.5

## 2022-03-09 MED ORDER — SODIUM CHLORIDE 0.9 % IV BOLUS
1000.0000 mL | Freq: Once | INTRAVENOUS | Status: AC
  Administered 2022-03-09: 1000 mL via INTRAVENOUS

## 2022-03-09 MED ORDER — HYDROMORPHONE HCL 1 MG/ML IJ SOLN
1.0000 mg | Freq: Once | INTRAMUSCULAR | Status: AC
  Administered 2022-03-09: 1 mg via INTRAVENOUS
  Filled 2022-03-09: qty 1

## 2022-03-09 MED ORDER — KCL IN DEXTROSE-NACL 20-5-0.45 MEQ/L-%-% IV SOLN
INTRAVENOUS | Status: AC
  Filled 2022-03-09 (×7): qty 1000

## 2022-03-09 MED ORDER — MONTELUKAST SODIUM 10 MG PO TABS
10.0000 mg | ORAL_TABLET | Freq: Every day | ORAL | Status: DC
  Administered 2022-03-09: 10 mg via ORAL
  Filled 2022-03-09: qty 1

## 2022-03-09 MED ORDER — ACETAMINOPHEN 650 MG RE SUPP: 650.0000 mg | Freq: Four times a day (QID) | RECTAL | Status: DC | PRN

## 2022-03-09 MED ORDER — MORPHINE SULFATE (PF) 4 MG/ML IV SOLN
4.0000 mg | Freq: Once | INTRAVENOUS | Status: AC
  Administered 2022-03-09: 4 mg via INTRAVENOUS
  Filled 2022-03-09: qty 1

## 2022-03-09 MED ORDER — BISOPROLOL FUMARATE 5 MG PO TABS
10.0000 mg | ORAL_TABLET | Freq: Every day | ORAL | Status: DC
  Administered 2022-03-09: 10 mg via ORAL
  Filled 2022-03-09: qty 2

## 2022-03-09 MED ORDER — ONDANSETRON HCL 4 MG/2ML IJ SOLN: 4.0000 mg | Freq: Four times a day (QID) | INTRAMUSCULAR | Status: DC | PRN

## 2022-03-09 MED ORDER — METRONIDAZOLE 500 MG/100ML IV SOLN
500.0000 mg | Freq: Two times a day (BID) | INTRAVENOUS | Status: AC
  Administered 2022-03-10 – 2022-03-16 (×13): 500 mg via INTRAVENOUS
  Filled 2022-03-09 (×13): qty 100

## 2022-03-09 MED ORDER — ACETAMINOPHEN 325 MG PO TABS: 650.0000 mg | ORAL_TABLET | Freq: Four times a day (QID) | ORAL | Status: DC | PRN

## 2022-03-09 MED ORDER — ONDANSETRON HCL 4 MG/2ML IJ SOLN
4.0000 mg | Freq: Once | INTRAMUSCULAR | Status: AC
  Administered 2022-03-09: 4 mg via INTRAVENOUS
  Filled 2022-03-09: qty 2

## 2022-03-09 MED ORDER — HYDROMORPHONE HCL 1 MG/ML IJ SOLN
1.0000 mg | INTRAMUSCULAR | Status: DC | PRN
  Administered 2022-03-10: 1 mg via INTRAVENOUS
  Filled 2022-03-09: qty 1

## 2022-03-09 MED ORDER — IOHEXOL 300 MG/ML  SOLN
100.0000 mL | Freq: Once | INTRAMUSCULAR | Status: AC | PRN
  Administered 2022-03-09: 100 mL via INTRAVENOUS

## 2022-03-09 NOTE — Progress Notes (Signed)
A consult was received from an ED physician for cefepime per pharmacy dosing.  The patient's profile has been reviewed for ht/wt/allergies/indication/available labs.   ? ?A one time order has been placed for cefepime 2gm IV x1.  Further antibiotics/pharmacy consults should be ordered by admitting physician if indicated.       ?                ?Thank you, ?Lynelle Doctor ?03/09/2022  4:33 PM ? ?

## 2022-03-09 NOTE — H&P (Signed)
? ? ? ?Tyler [name] is an 71 y.o. male.   ?Chief Complaint: Abdominal pain, fever ?HPI: Patient is a 71 year old male who is a patient of my partner, Dr. Leighton Ruff.  Patient underwent hemorrhoid surgery on March 05, 2022 with a dearterialization THD procedure.  Postoperative course has been complicated by increasing abdominal pain, nausea, and fever.  Patient had contacted the office on more than one occasion to increase his pain medication and using enemas.  Patient was also taking MiraLAX with soft bowel movements.  He has had no significant bleeding.  However the pain became worse over the course of the last 2 days and the patient presents now to the emergency department for evaluation.  White blood cell count is at the upper range of normal at 10.1.  CT scan of the abdomen and pelvis was performed.  This showed thickening of the rectal wall.  There was also gas and fluid within the rectal wall and in the presacral space concerning for possible rectal perforation.  The surgical service was called for evaluation and management.  Emergency room physician has already started the patient on cefepime and metronidazole. ? ?Past Medical History:  ?Diagnosis Date  ? Allergic rhinitis   ? 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 Chronic pruritic rash in adult 2004  ? hx shingles  w/ residual recurrent rash, take valtrex daily  ? ED (erectile dysfunction)   ? GERD (gastroesophageal reflux disease)   ? H/O pityriasis rosea   ? History of basal cell carcinoma (BCC) excision   ? 2022 left calf s/p mohs  ? History of pertussis 2018  ? History of SCC (squamous cell carcinoma) of skin   ? removed from head  ? History of thrombocytopenia 12/03/2010  ? Hx of adenomatous polyp of colon 04/07/2017  ? Hypertension   ? Mild asthma   ? followed by pcp  ? OA (osteoarthritis)   ? knees, fingers  ? Prolapsed internal hemorrhoids, grade 3   ? hx  external hemorroid banding  ? Wears glasses   ? ? ?Past Surgical  History:  ?Procedure Laterality Date  ? COLONOSCOPY WITH PROPOFOL  04/01/2017  ? by dr Carlean Purl  ? DIRECT LARYNGOSCOPY  1986  ? left vocal fold bx   (non-cancerous granuloma)  ? FINGER SURGERY Left 2017  ? ligament and tendon repair left index finger  ? INGUINAL HERNIA REPAIR Right 05/25/2018  ? Procedure: RIGHT INGUINAL HERNIA REPAIR WITH MESH;  Surgeon: Jovita Kussmaul, MD;  Location: [facility];  Service: General;  Laterality: Right;  ? INSERTION OF MESH Right 05/25/2018  ? Procedure: INSERTION OF MESH;  Surgeon: Jovita Kussmaul, MD;  Location: [facility];  Service: General;  Laterality: Right;  ? KNEE ARTHROSCOPY W/ MENISCAL REPAIR Right 10/26/2005  ? '@[facility]'$   by Dr  Wynelle Link  ? LAPAROSCOPIC APPENDECTOMY  03/22/2011  ? '@[facility]'$   ? MOHS SURGERY  05/2021  ? left lower leg  ? TONSILLECTOMY  1958  ? TRANSANAL HEMORRHOIDAL DEARTERIALIZATION N/A 03/05/2022  ? Procedure: TRANSANAL HEMORRHOIDAL DEARTERIALIZATION;  Surgeon: Leighton Ruff, MD;  Location: [facility];  Service: General;  Laterality: N/A;  ? ? ?Family History  ?Problem Relation Age of Onset  ? AAA (abdominal aortic aneurysm) Mother   ? Glaucoma Sister   ? Stomach cancer Paternal Grandfather   ? Colon cancer Neg Hx   ? ?Social History:  reports that he quit smoking about 33 years ago. His smoking use  included cigarettes. He has never used smokeless tobacco. He reports current alcohol use of about 28.0 standard drinks per week. He reports that he does not use drugs. ? ?Allergies:  ?Allergies  ?Allergen Reactions  ? Diflucan [Fluconazole] Rash  ? Griseofulvin Anaphylaxis, Swelling and Rash  ?  Throat swelling ? ?  ? Dulera [Mometasone Furo-Formoterol Fum] Other (See Comments)  ?  Lack of therapeutic effect  ? Penicillins   ?  REACTION: High fever  ? Sulfa Antibiotics Other (See Comments)  ?  Joints ache  ? Altabax [Retapamulin] Rash  ? ? ?(Not in a hospital admission) ? ? ?Results for orders placed or performed during the  hospital encounter of 03/09/22 (from the past 48 hour(s))  ?Comprehensive metabolic panel     Status: Abnormal  ? Collection Time: 03/09/22  2:32 PM  ?Result Value Ref Range  ? Sodium 132 (L) 135 - 145 mmol/L  ? Potassium 3.4 (L) 3.5 - 5.1 mmol/L  ? Chloride 98 98 - 111 mmol/L  ? CO2 18 (L) 22 - 32 mmol/L  ? Glucose, Bld 87 70 - 99 mg/dL  ?  Comment: Glucose reference range applies only to samples taken after fasting for at least 8 hours.  ? BUN 27 (H) 8 - 23 mg/dL  ? Creatinine, Ser 1.27 (H) 0.61 - 1.24 mg/dL  ? Calcium 9.1 8.9 - 10.3 mg/dL  ? Total Protein 6.5 6.5 - 8.1 g/dL  ? Albumin 3.4 (L) 3.5 - 5.0 g/dL  ? AST 40 15 - 41 U/L  ? ALT 54 (H) 0 - 44 U/L  ? Alkaline Phosphatase 87 38 - 126 U/L  ? Total Bilirubin 3.1 (H) 0.3 - 1.2 mg/dL  ? GFR, Estimated >60 >60 mL/min  ?  Comment: (NOTE) ?Calculated using the CKD-EPI Creatinine Equation (2021) ?  ? Anion gap 16 (H) 5 - 15  ?  Comment: Performed at [facility], [place] [address] [place], [place] [address]  ?CBC with Differential     Status: Abnormal  ? Collection Time: 03/09/22  2:32 PM  ?Result Value Ref Range  ? WBC 10.1 4.0 - 10.5 K/uL  ? RBC 4.40 4.22 - 5.81 MIL/uL  ? Hemoglobin 15.2 13.0 - 17.0 g/dL  ? HCT 43.2 39.0 - 52.0 %  ? MCV 98.2 80.0 - 100.0 fL  ? MCH 34.5 (H) 26.0 - 34.0 pg  ? MCHC 35.2 30.0 - 36.0 g/dL  ? RDW 12.8 11.5 - 15.5 %  ? Platelets 139 (L) 150 - 400 K/uL  ? nRBC 0.0 0.0 - 0.2 %  ? Neutrophils Relative % 83 %  ? Neutro Abs 9.2 (H) 1.7 - 7.7 K/uL  ? Band Neutrophils 8 %  ? Lymphocytes Relative 2 %  ? Lymphs Abs 0.2 (L) 0.7 - 4.0 K/uL  ? Monocytes Relative 7 %  ? Monocytes Absolute 0.7 0.1 - 1.0 K/uL  ? Eosinophils Relative 0 %  ? Eosinophils Absolute 0.0 0.0 - 0.5 K/uL  ? Basophils Relative 0 %  ? Basophils Absolute 0.0 0.0 - 0.1 K/uL  ? WBC Morphology MILD LEFT SHIFT (1-5% METAS, OCC MYELO, OCC BANDS)   ?  Comment: VACUOLATED NEUTROPHILS ?RARE BANDS PRESENT ?  ? Abs Immature Granulocytes 0.00 0.00 - 0.07 K/uL  ?  Comment:  Performed at [facility], [place] [address], [place], [place] [address]  ?Lipase, blood     Status: None  ? Collection Time: 03/09/22  2:32 PM  ?Result Value Ref  Range  ? Lipase 22 11 - 51 U/L  ?  Comment: Performed at [facility], [place] [address] [place], [place] [address]  ?Resp Panel by RT-PCR (Flu A&B, Covid) Nasopharyngeal Swab     Status: None  ? Collection Time: 03/09/22  3:00 PM  ? Specimen: Nasopharyngeal Swab; Nasopharyngeal(NP) swabs in vial transport medium  ?Result Value Ref Range  ? SARS Coronavirus 2 by RT PCR NEGATIVE NEGATIVE  ?  Comment: (NOTE) ?SARS-CoV-2 target nucleic acids are NOT DETECTED. ? ?The SARS-CoV-2 RNA is generally detectable in upper respiratory ?specimens during the acute phase of infection. The lowest ?concentration of SARS-CoV-2 viral copies this assay can detect is ?138 copies/mL. A negative result does not preclude SARS-Cov-2 ?infection and should not be used as the sole basis for treatment or ?other patient management decisions. A negative result may occur with  ?improper specimen collection/handling, submission of specimen other ?than nasopharyngeal swab, presence of viral mutation(s) within the ?areas targeted by this assay, and inadequate number of viral ?copies(<138 copies/mL). A negative result must be combined with ?clinical observations, patient history, and epidemiological ?information. The expected result is Negative. ? ?Fact Sheet for Patients:  ?EntrepreneurPulse.com.au ? ?Fact Sheet for Healthcare Providers:  ?IncredibleEmployment.be ? ?This test is no t yet approved or cleared by the [place] FDA and  ?has been authorized for detection and/or diagnosis of SARS-CoV-2 by ?FDA under an Emergency Use Authorization (EUA). This EUA will remain  ?in effect (meaning this test can be used) for the duration of the ?COVID-19 declaration under Section 564(b)(1) of the Act, 21 ?U.S.C.section  360bbb-3(b)(1), unless the authorization is terminated  ?or revoked sooner.  ? ? ?  ? Influenza A by PCR NEGATIVE NEGATIVE  ? Influenza B by PCR NEGATIVE NEGATIVE  ?  Comment: (NOTE) ?The Xpert Xpress SARS-CoV-2/FLU/RSV pl

## 2022-03-09 NOTE — ED Triage Notes (Addendum)
Patient had hemorrhoid surgery on 03/05/22. Patient states that he began having fever(101.0) on 03/06/22, vomiting and fever 2 days and yesterday he began having abdominal pain and bloating . Patient states that he also began having small BM's yesterday. ? ? ?

## 2022-03-09 NOTE — ED Provider Notes (Signed)
?[facility] DEPT ?Provider Note ? ? ?CSN: [id number] ?Arrival date & time: 03/09/22  1357 ? ?  ? ?History ? ?Chief Complaint  ?Patient presents with  ? Fever  ? Post-op Problem  ? Emesis  ? ? ?Tyler [name] is a 71 y.o. male with a past medical history of hypertension, GERD, status post appendectomy, status post inguinal hernia repair, status post transanal hemorrhoid dearterialization (03/05/2022 performed by Dr. Marcello Moores).  Presents to the emergency department with a complaint of abdominal pain, nausea, vomiting, and fever. ? ?Patient reports that he started having lower abdominal pain after his surgery.  Pain onset has been gradual and progressively worsening.  Pain is worse with touch and movement.  Patient reports that on Sunday he had 1 episode of vomiting.  Describes emesis as clear to yellow in color.  Patient states that he has had fevers over the last 3 days.  Tmax of 101.7 ?F on Saturday, 101.3 ?F on Sunday, and 100.3 ?F yesterday.  Patient is afebrile today.  Patient reports that he has had constipation since his surgery.  However has been taking MiraLAX, fiber, and performing enemas.  Patient was able to have a bowel movement yesterday and today.  States that yesterday bowel movement was black in color.  Today bowel movement has returned to brown.  Patient reports that due to his abdominal discomfort he has had decreased p.o. intake. ? ?Patient denies any chills, rectal pain, blood in stool, diarrhea, abdominal distention, dysuria, hematuria, urinary urgency, swelling or tenderness to genitals, chest pain, shortness of breath. ? ? ?Fever ?Associated symptoms: nausea and vomiting   ?Associated symptoms: no chest pain, no chills, no confusion, no diarrhea, no dysuria, no headaches and no rash   ?Emesis ?Associated symptoms: abdominal pain and fever   ?Associated symptoms: no chills, no diarrhea and no headaches   ? ?  ? ?Home Medications ?Prior to Admission medications    ?Medication Sig Start Date End Date Taking? Authorizing Provider  ?albuterol (VENTOLIN HFA) 108 (90 Base) MCG/ACT inhaler Inhale 2 puffs into the lungs every 6 (six) hours as needed for wheezing or shortness of breath.    [provider]  ?amLODipine (NORVASC) 5 MG tablet TAKE 1 TABLET BY MOUTH DAILY ?Patient taking differently: Take 5 mg by mouth daily. 04/29/14   Ricard Dillon, MD  ?azelastine (ASTELIN) 0.1 % nasal spray Place 1 spray into both nostrils 2 (two) times daily as needed for rhinitis. Use in each nostril as directed    [provider]  ?bisoprolol (ZEBETA) 10 MG tablet Take 10 mg by mouth at bedtime.    [provider]  ?diazepam (VALIUM) 5 MG tablet Take 1 tablet (5 mg total) by mouth every 8 (eight) hours as needed for anxiety. 03/05/22 03/03/61  Leighton Ruff, MD  ?esomeprazole (NEXIUM) 20 MG capsule Take 1 capsule (20 mg total) by mouth daily before breakfast. 09/17/13   Ricard Dillon, MD  ?meloxicam (MOBIC) 15 MG tablet Take 1 tablet by mouth daily as needed. 04/25/18   [provider]  ?montelukast (SINGULAIR) 10 MG tablet TAKE 1 TABLET BY MOUTH EVERY NIGHT AT BEDTIME ?Patient taking differently: Take 10 mg by mouth at bedtime. 09/10/13   Ricard Dillon, MD  ?oxyCODONE (OXY IR/ROXICODONE) 5 MG immediate release tablet Take 1-2 tablets (5-10 mg total) by mouth every 6 (six) hours as needed for severe pain. 2/29/79   Leighton Ruff, MD  ?tadalafil (CIALIS) 10 MG tablet Take 10 mg  by mouth daily as needed for erectile dysfunction.    [provider]  ?valACYclovir (VALTREX) 500 MG tablet Take 500 mg by mouth daily.    [provider]  ?   ? ?Allergies    ?Diflucan [fluconazole], Griseofulvin, Dulera [mometasone furo-formoterol fum], Penicillins, Sulfa antibiotics, and Altabax [retapamulin]   ? ?Review of Systems   ?Review of Systems  ?Constitutional:  Positive for fever. Negative for chills.  ?Eyes:  Negative for visual disturbance.   ?Respiratory:  Negative for shortness of breath.   ?Cardiovascular:  Negative for chest pain.  ?Gastrointestinal:  Positive for abdominal pain, constipation, nausea and vomiting. Negative for abdominal distention, anal bleeding, blood in stool, diarrhea and rectal pain.  ?Genitourinary:  Negative for difficulty urinating, dysuria, flank pain, frequency, genital sores, hematuria, penile discharge, penile pain, penile swelling, scrotal swelling, testicular pain and urgency.  ?Musculoskeletal:  Negative for back pain and neck pain.  ?Skin:  Negative for color change and rash.  ?Neurological:  Negative for dizziness, syncope, light-headedness and headaches.  ?Psychiatric/Behavioral:  Negative for confusion.   ? ?Physical Exam ?Updated Vital Signs ?BP 121/78   Pulse 81   Temp (!) 97.5 ?F (36.4 ?C) (Oral)   Resp 16   Ht '5\' 9"'$  (1.753 m)   Wt 77.2 kg   SpO2 100%   BMI 25.15 kg/m?  ?Physical Exam ?Vitals and nursing note reviewed.  ?Constitutional:   ?   General: He is not in acute distress. ?   Appearance: He is not ill-appearing, toxic-appearing or diaphoretic.  ?HENT:  ?   Head: Normocephalic.  ?Eyes:  ?   General: No scleral icterus.    ?   Right eye: No discharge.     ?   Left eye: No discharge.  ?Cardiovascular:  ?   Rate and Rhythm: Normal rate.  ?Pulmonary:  ?   Effort: Pulmonary effort is normal. No tachypnea, bradypnea or respiratory distress.  ?   Breath sounds: Normal breath sounds. No stridor.  ?Abdominal:  ?   General: Abdomen is protuberant. Bowel sounds are normal. There is no distension. There are no signs of injury.  ?   Palpations: Abdomen is soft. There is no mass or pulsatile mass.  ?   Tenderness: There is generalized abdominal tenderness. There is guarding. There is no rebound.  ?   Hernia: There is no hernia in the umbilical area or ventral area.  ?   Comments: Generalized tenderness throughout the entire abdomen, tenderness is worse to bilateral lower quadrants.  Guarding to palpation of left  lower quadrant  ?Skin: ?   General: Skin is warm and dry.  ?Neurological:  ?   General: No focal deficit present.  ?   Mental Status: He is alert.  ?Psychiatric:     ?   Behavior: Behavior is cooperative.  ? ? ?ED Results / Procedures / Treatments   ?Labs ?(all labs ordered are listed, but only abnormal results are displayed) ?Labs Reviewed  ?COMPREHENSIVE METABOLIC PANEL - Abnormal; Notable for the following components:  ?    Result Value  ? Sodium 132 (*)   ? Potassium 3.4 (*)   ? CO2 18 (*)   ? BUN 27 (*)   ? Creatinine, Ser 1.27 (*)   ? Albumin 3.4 (*)   ? ALT 54 (*)   ? Total Bilirubin 3.1 (*)   ? Anion gap 16 (*)   ? All other components within normal limits  ?CBC WITH DIFFERENTIAL/PLATELET - Abnormal; Notable  for the following components:  ? MCH 34.5 (*)   ? Platelets 139 (*)   ? Neutro Abs 9.2 (*)   ? Lymphs Abs 0.2 (*)   ? All other components within normal limits  ?RESP PANEL BY RT-PCR (FLU A&B, COVID) ARPGX2  ?LIPASE, BLOOD  ?URINALYSIS, ROUTINE W REFLEX MICROSCOPIC  ?POC OCCULT BLOOD, ED  ? ? ?EKG ?None ? ?Radiology ?No results found. ? ?Procedures ?Marland KitchenCritical Care ?Performed by: Loni Beckwith, PA-C ?Authorized by: Loni Beckwith, PA-C  ? ?Critical care provider statement:  ?  Critical care time (minutes):  30 ?  Critical care was time spent personally by me on the following activities:  Development of treatment plan with patient or surrogate, discussions with consultants, evaluation of patient's response to treatment, examination of patient, ordering and review of laboratory studies, ordering and review of radiographic studies, ordering and performing treatments and interventions, pulse oximetry, re-evaluation of patient's condition and review of old charts ?  Care discussed with: admitting provider   ?Comments:  ?   Rectal perforation  ? ? ?Medications Ordered in ED ?Medications  ?sodium chloride 0.9 % bolus 1,000 mL (has no administration in time range)  ?morphine (PF) 4 MG/ML injection 4  mg (has no administration in time range)  ?ondansetron (ZOFRAN) injection 4 mg (has no administration in time range)  ? ? ?ED Course/ Medical Decision Making/ A&P ?Clinical Course as of 03/09/22 2358  ?Tue Mar 09, 2022  ?1

## 2022-03-09 NOTE — Progress Notes (Signed)
Pharmacy Antibiotic Note ? ?Tyler [name] is a 71 y.o. male with hx rectal bleeding and prolapsed internal hemorrhoids who underwent transanal hemorrhoidal dearterialization on 03/05/22.  He presented to the ED on 03/09/2022 with c/o fever, abdominal pain and n/v. Abd CT on 03/09/22 showed findings with concern for rectal perforation. Pharmacy has been consulted to dose cefepime for infection. ? ?Plan: ?- cefepime 2gm IV q12h ?- flagyl 500 mg IV q12h per MD ? ?__________________________________ ? ?Height: '5\' 9"'$  (175.3 cm) ?Weight: 77.2 kg (170 lb 4.8 oz) ?IBW/kg (Calculated) : 70.7 ? ?Temp (24hrs), Avg:97.5 ?F (36.4 ?C), Min:97.5 ?F (36.4 ?C), Max:97.5 ?F (36.4 ?C) ? ?Recent Labs  ?Lab 03/05/22 ?1110 03/09/22 ?1432  ?WBC  --  10.1  ?CREATININE 0.80 1.27*  ?  ?Estimated Creatinine Clearance: 54.9 mL/min (A) (by C-G formula based on SCr of 1.27 mg/dL (H)).   ? ?Allergies  ?Allergen Reactions  ? Diflucan [Fluconazole] Rash  ? Griseofulvin Anaphylaxis, Swelling and Rash  ?  Throat swelling ? ?  ? Dulera [Mometasone Furo-Formoterol Fum] Other (See Comments)  ?  Lack of therapeutic effect  ? Penicillins   ?  REACTION: High fever  ? Sulfa Antibiotics Other (See Comments)  ?  Joints ache  ? Altabax [Retapamulin] Rash  ? ? ? ?Thank you for allowing pharmacy to be a part of this patient?s care. ? ?Lynelle Doctor ?03/09/2022 5:39 PM ? ?

## 2022-03-09 NOTE — ED Provider Triage Note (Signed)
Emergency Medicine Provider Triage Evaluation Note ? ?Tyler [name] , a 71 y.o. male  was evaluated in triage.  Pt complains of abdominal pain, bloating, vomiting and fever.  Patient had transanal hemorrhoidal dearterialization on 4/21.  Patient began having lower abdominal pain after the surgery.  Pain is located throughout his entire lower abdomen.  Patient started having nausea and vomiting on Sunday.  Patient reports that he has had fevers over the last 2 days.  Patient reports that he has been taking MiraLAX, fiber, and performing enemas.  Patient is has had bowel movements since yesterday.  Reports that stool was dark in color. ? ?Review of Systems  ?Positive: Fever, abdominal pain, bloating, nausea, vomiting, ?Negative: Dysuria, hematuria, urinary urgency, swelling or tenderness to genitals, blood in stool, hematemesis, coffee-ground emesis ? ?Physical Exam  ?BP 123/77 (BP Location: Left Arm)   Pulse 100   Temp (!) 97.5 ?F (36.4 ?C) (Oral)   Resp 16   Ht '5\' 9"'$  (1.753 m)   Wt 77.2 kg   SpO2 100%   BMI 25.15 kg/m?  ?Gen:   Awake, no distress   ?Resp:  Normal effort  ?MSK:   Moves extremities without difficulty  ?Other:  Abdomen soft, nondistended, tenderness to bilateral lower quadrants. ? ?Medical Decision Making  ?Medically screening exam initiated at 2:16 PM.  Appropriate orders placed.  Tyler [name] was informed that the remainder of the evaluation will be completed by another provider, this initial triage assessment does not replace that evaluation, and the importance of remaining in the ED until their evaluation is complete. ? ? ?  ?Loni Beckwith, PA-C ?03/09/22 1418 ? ?

## 2022-03-10 DIAGNOSIS — E8770 Fluid overload, unspecified: Secondary | ICD-10-CM | POA: Diagnosis not present

## 2022-03-10 DIAGNOSIS — L0291 Cutaneous abscess, unspecified: Secondary | ICD-10-CM | POA: Diagnosis not present

## 2022-03-10 DIAGNOSIS — Z880 Allergy status to penicillin: Secondary | ICD-10-CM | POA: Diagnosis not present

## 2022-03-10 DIAGNOSIS — I96 Gangrene, not elsewhere classified: Secondary | ICD-10-CM | POA: Diagnosis present

## 2022-03-10 DIAGNOSIS — K6811 Postprocedural retroperitoneal abscess: Secondary | ICD-10-CM | POA: Diagnosis present

## 2022-03-10 DIAGNOSIS — R6520 Severe sepsis without septic shock: Secondary | ICD-10-CM | POA: Diagnosis not present

## 2022-03-10 DIAGNOSIS — D62 Acute posthemorrhagic anemia: Secondary | ICD-10-CM | POA: Diagnosis not present

## 2022-03-10 DIAGNOSIS — G4700 Insomnia, unspecified: Secondary | ICD-10-CM | POA: Diagnosis present

## 2022-03-10 DIAGNOSIS — E43 Unspecified severe protein-calorie malnutrition: Secondary | ICD-10-CM | POA: Diagnosis not present

## 2022-03-10 DIAGNOSIS — M25559 Pain in unspecified hip: Secondary | ICD-10-CM | POA: Diagnosis not present

## 2022-03-10 DIAGNOSIS — G8918 Other acute postprocedural pain: Secondary | ICD-10-CM | POA: Diagnosis not present

## 2022-03-10 DIAGNOSIS — B9629 Other Escherichia coli [E. coli] as the cause of diseases classified elsewhere: Secondary | ICD-10-CM | POA: Diagnosis not present

## 2022-03-10 DIAGNOSIS — D638 Anemia in other chronic diseases classified elsewhere: Secondary | ICD-10-CM | POA: Diagnosis present

## 2022-03-10 DIAGNOSIS — L02416 Cutaneous abscess of left lower limb: Secondary | ICD-10-CM | POA: Diagnosis present

## 2022-03-10 DIAGNOSIS — R3915 Urgency of urination: Secondary | ICD-10-CM | POA: Diagnosis not present

## 2022-03-10 DIAGNOSIS — K567 Ileus, unspecified: Secondary | ICD-10-CM | POA: Diagnosis not present

## 2022-03-10 DIAGNOSIS — R1010 Upper abdominal pain, unspecified: Secondary | ICD-10-CM | POA: Diagnosis not present

## 2022-03-10 DIAGNOSIS — I10 Essential (primary) hypertension: Secondary | ICD-10-CM | POA: Diagnosis not present

## 2022-03-10 DIAGNOSIS — F418 Other specified anxiety disorders: Secondary | ICD-10-CM | POA: Diagnosis not present

## 2022-03-10 DIAGNOSIS — D649 Anemia, unspecified: Secondary | ICD-10-CM | POA: Diagnosis not present

## 2022-03-10 DIAGNOSIS — R338 Other retention of urine: Secondary | ICD-10-CM | POA: Diagnosis not present

## 2022-03-10 DIAGNOSIS — J9811 Atelectasis: Secondary | ICD-10-CM | POA: Diagnosis not present

## 2022-03-10 DIAGNOSIS — F05 Delirium due to known physiological condition: Secondary | ICD-10-CM | POA: Diagnosis not present

## 2022-03-10 DIAGNOSIS — D72829 Elevated white blood cell count, unspecified: Secondary | ICD-10-CM | POA: Diagnosis not present

## 2022-03-10 DIAGNOSIS — K631 Perforation of intestine (nontraumatic): Principal | ICD-10-CM | POA: Diagnosis present

## 2022-03-10 DIAGNOSIS — B952 Enterococcus as the cause of diseases classified elsewhere: Secondary | ICD-10-CM | POA: Diagnosis present

## 2022-03-10 DIAGNOSIS — B3789 Other sites of candidiasis: Secondary | ICD-10-CM | POA: Diagnosis present

## 2022-03-10 DIAGNOSIS — E860 Dehydration: Secondary | ICD-10-CM | POA: Diagnosis present

## 2022-03-10 DIAGNOSIS — D696 Thrombocytopenia, unspecified: Secondary | ICD-10-CM | POA: Diagnosis present

## 2022-03-10 DIAGNOSIS — Z20822 Contact with and (suspected) exposure to covid-19: Secondary | ICD-10-CM | POA: Diagnosis present

## 2022-03-10 DIAGNOSIS — R52 Pain, unspecified: Secondary | ICD-10-CM | POA: Diagnosis not present

## 2022-03-10 DIAGNOSIS — M7989 Other specified soft tissue disorders: Secondary | ICD-10-CM | POA: Diagnosis not present

## 2022-03-10 DIAGNOSIS — Z87891 Personal history of nicotine dependence: Secondary | ICD-10-CM | POA: Diagnosis not present

## 2022-03-10 DIAGNOSIS — L02419 Cutaneous abscess of limb, unspecified: Secondary | ICD-10-CM | POA: Diagnosis not present

## 2022-03-10 DIAGNOSIS — E878 Other disorders of electrolyte and fluid balance, not elsewhere classified: Secondary | ICD-10-CM | POA: Diagnosis not present

## 2022-03-10 DIAGNOSIS — K651 Peritoneal abscess: Secondary | ICD-10-CM | POA: Diagnosis not present

## 2022-03-10 DIAGNOSIS — A4151 Sepsis due to Escherichia coli [E. coli]: Secondary | ICD-10-CM | POA: Diagnosis not present

## 2022-03-10 DIAGNOSIS — Z433 Encounter for attention to colostomy: Secondary | ICD-10-CM | POA: Diagnosis not present

## 2022-03-10 DIAGNOSIS — A419 Sepsis, unspecified organism: Secondary | ICD-10-CM | POA: Diagnosis not present

## 2022-03-10 DIAGNOSIS — S31809D Unspecified open wound of unspecified buttock, subsequent encounter: Secondary | ICD-10-CM | POA: Diagnosis not present

## 2022-03-10 DIAGNOSIS — K659 Peritonitis, unspecified: Secondary | ICD-10-CM | POA: Diagnosis not present

## 2022-03-10 DIAGNOSIS — R188 Other ascites: Secondary | ICD-10-CM | POA: Diagnosis present

## 2022-03-10 DIAGNOSIS — R339 Retention of urine, unspecified: Secondary | ICD-10-CM | POA: Diagnosis not present

## 2022-03-10 DIAGNOSIS — K9189 Other postprocedural complications and disorders of digestive system: Secondary | ICD-10-CM | POA: Diagnosis not present

## 2022-03-10 DIAGNOSIS — Z800 Family history of malignant neoplasm of digestive organs: Secondary | ICD-10-CM | POA: Diagnosis not present

## 2022-03-10 DIAGNOSIS — K610 Anal abscess: Secondary | ICD-10-CM | POA: Diagnosis present

## 2022-03-10 DIAGNOSIS — L89616 Pressure-induced deep tissue damage of right heel: Secondary | ICD-10-CM | POA: Diagnosis present

## 2022-03-10 DIAGNOSIS — R502 Drug induced fever: Secondary | ICD-10-CM | POA: Diagnosis not present

## 2022-03-10 DIAGNOSIS — T8143XA Infection following a procedure, organ and space surgical site, initial encounter: Secondary | ICD-10-CM | POA: Diagnosis not present

## 2022-03-10 DIAGNOSIS — E874 Mixed disorder of acid-base balance: Secondary | ICD-10-CM | POA: Diagnosis not present

## 2022-03-10 DIAGNOSIS — N170 Acute kidney failure with tubular necrosis: Secondary | ICD-10-CM | POA: Diagnosis not present

## 2022-03-10 DIAGNOSIS — B9620 Unspecified Escherichia coli [E. coli] as the cause of diseases classified elsewhere: Secondary | ICD-10-CM | POA: Diagnosis not present

## 2022-03-10 DIAGNOSIS — K4090 Unilateral inguinal hernia, without obstruction or gangrene, not specified as recurrent: Secondary | ICD-10-CM | POA: Diagnosis present

## 2022-03-10 DIAGNOSIS — J45909 Unspecified asthma, uncomplicated: Secondary | ICD-10-CM | POA: Diagnosis present

## 2022-03-10 DIAGNOSIS — E8809 Other disorders of plasma-protein metabolism, not elsewhere classified: Secondary | ICD-10-CM | POA: Diagnosis not present

## 2022-03-10 DIAGNOSIS — R509 Fever, unspecified: Secondary | ICD-10-CM | POA: Diagnosis not present

## 2022-03-10 DIAGNOSIS — E876 Hypokalemia: Secondary | ICD-10-CM | POA: Diagnosis not present

## 2022-03-10 DIAGNOSIS — J9601 Acute respiratory failure with hypoxia: Secondary | ICD-10-CM | POA: Diagnosis not present

## 2022-03-10 DIAGNOSIS — B029 Zoster without complications: Secondary | ICD-10-CM | POA: Diagnosis present

## 2022-03-10 DIAGNOSIS — J4520 Mild intermittent asthma, uncomplicated: Secondary | ICD-10-CM | POA: Diagnosis not present

## 2022-03-10 DIAGNOSIS — A498 Other bacterial infections of unspecified site: Secondary | ICD-10-CM | POA: Diagnosis not present

## 2022-03-10 DIAGNOSIS — R5381 Other malaise: Secondary | ICD-10-CM | POA: Diagnosis not present

## 2022-03-10 DIAGNOSIS — J4530 Mild persistent asthma, uncomplicated: Secondary | ICD-10-CM | POA: Diagnosis present

## 2022-03-10 DIAGNOSIS — Z933 Colostomy status: Secondary | ICD-10-CM | POA: Diagnosis not present

## 2022-03-10 DIAGNOSIS — Z83511 Family history of glaucoma: Secondary | ICD-10-CM | POA: Diagnosis not present

## 2022-03-10 DIAGNOSIS — A4181 Sepsis due to Enterococcus: Secondary | ICD-10-CM | POA: Diagnosis not present

## 2022-03-10 DIAGNOSIS — Z888 Allergy status to other drugs, medicaments and biological substances status: Secondary | ICD-10-CM | POA: Diagnosis not present

## 2022-03-10 DIAGNOSIS — L89626 Pressure-induced deep tissue damage of left heel: Secondary | ICD-10-CM | POA: Diagnosis present

## 2022-03-10 LAB — CBC
HCT: 36.5 % — ABNORMAL LOW (ref 39.0–52.0)
Hemoglobin: 12.9 g/dL — ABNORMAL LOW (ref 13.0–17.0)
MCH: 34.4 pg — ABNORMAL HIGH (ref 26.0–34.0)
MCHC: 35.3 g/dL (ref 30.0–36.0)
MCV: 97.3 fL (ref 80.0–100.0)
Platelets: 149 10*3/uL — ABNORMAL LOW (ref 150–400)
RBC: 3.75 MIL/uL — ABNORMAL LOW (ref 4.22–5.81)
RDW: 13.2 % (ref 11.5–15.5)
WBC: 8.1 10*3/uL (ref 4.0–10.5)
nRBC: 0 % (ref 0.0–0.2)

## 2022-03-10 LAB — HIV ANTIBODY (ROUTINE TESTING W REFLEX): HIV Screen 4th Generation wRfx: NONREACTIVE

## 2022-03-10 MED ORDER — AMLODIPINE BESYLATE 5 MG PO TABS
5.0000 mg | ORAL_TABLET | Freq: Every morning | ORAL | Status: DC
  Administered 2022-03-10 – 2022-03-19 (×10): 5 mg via ORAL
  Filled 2022-03-10 (×10): qty 1

## 2022-03-10 MED ORDER — MONTELUKAST SODIUM 10 MG PO TABS
10.0000 mg | ORAL_TABLET | Freq: Every day | ORAL | Status: DC
  Administered 2022-03-10 – 2022-03-19 (×10): 10 mg via ORAL
  Filled 2022-03-10 (×11): qty 1

## 2022-03-10 MED ORDER — ALBUTEROL SULFATE (2.5 MG/3ML) 0.083% IN NEBU
3.0000 mL | INHALATION_SOLUTION | Freq: Four times a day (QID) | RESPIRATORY_TRACT | Status: DC | PRN
  Administered 2022-03-21: 3 mL via RESPIRATORY_TRACT
  Filled 2022-03-10: qty 3

## 2022-03-10 MED ORDER — HYDROMORPHONE HCL 1 MG/ML IJ SOLN
0.5000 mg | INTRAMUSCULAR | Status: DC | PRN
  Administered 2022-03-10 – 2022-03-15 (×20): 1 mg via INTRAVENOUS
  Filled 2022-03-10 (×21): qty 1

## 2022-03-10 MED ORDER — BISOPROLOL FUMARATE 5 MG PO TABS
10.0000 mg | ORAL_TABLET | Freq: Every day | ORAL | Status: DC
  Administered 2022-03-10 – 2022-03-19 (×10): 10 mg via ORAL
  Filled 2022-03-10 (×10): qty 2

## 2022-03-10 MED ORDER — VALACYCLOVIR HCL 500 MG PO TABS
500.0000 mg | ORAL_TABLET | Freq: Every morning | ORAL | Status: DC
Start: 2022-03-10 — End: 2022-03-21
  Administered 2022-03-10 – 2022-03-19 (×10): 500 mg via ORAL
  Filled 2022-03-10 (×12): qty 1

## 2022-03-10 MED ORDER — AZELASTINE HCL 0.1 % NA SOLN
1.0000 | Freq: Two times a day (BID) | NASAL | Status: DC | PRN
  Administered 2022-03-31: 1 via NASAL
  Filled 2022-03-10: qty 30

## 2022-03-10 MED ORDER — METHOCARBAMOL 1000 MG/10ML IJ SOLN
500.0000 mg | Freq: Three times a day (TID) | INTRAVENOUS | Status: DC
  Administered 2022-03-10 – 2022-03-12 (×6): 500 mg via INTRAVENOUS
  Filled 2022-03-10 (×3): qty 500
  Filled 2022-03-10: qty 5
  Filled 2022-03-10 (×3): qty 500

## 2022-03-10 MED ORDER — PANTOPRAZOLE SODIUM 40 MG PO TBEC
40.0000 mg | DELAYED_RELEASE_TABLET | Freq: Every day | ORAL | Status: DC
Start: 2022-03-10 — End: 2022-03-22
  Administered 2022-03-10 – 2022-03-19 (×10): 40 mg via ORAL
  Filled 2022-03-10 (×10): qty 1

## 2022-03-10 NOTE — Progress Notes (Signed)
?   03/09/22 2142  ?Assess: MEWS Score  ?Temp 98.2 ?F (36.8 ?C)  ?BP 113/77  ?Pulse Rate (!) 124 ?(notified nurse)  ?Resp 16  ?SpO2 94 %  ?O2 Device Room Air  ?Assess: MEWS Score  ?MEWS Temp 0  ?MEWS Systolic 0  ?MEWS Pulse 2  ?MEWS RR 0  ?MEWS LOC 0  ?MEWS Score 2  ?MEWS Score Color Yellow  ?Assess: if the MEWS score is Yellow or Red  ?Were vital signs taken at a resting state? Yes  ?Focused Assessment No change from prior assessment  ?Does the patient meet 2 or more of the SIRS criteria? No  ?MEWS guidelines implemented *See Row Information* Yes  ?Treat  ?MEWS Interventions Other (Comment);Administered scheduled meds/treatments ?(Notified MD)  ?Pain Scale 0-10  ?Pain Score 0  ?Take Vital Signs  ?Increase Vital Sign Frequency  Yellow: Q 2hr X 2 then Q 4hr X 2, if remains yellow, continue Q 4hrs  ?Escalate  ?MEWS: Escalate Yellow: discuss with charge nurse/RN and consider discussing with provider and RRT  ?Notify: Charge Nurse/RN  ?Name of Charge Nurse/RN Notified Adriana, RN  ?Date Charge Nurse/RN Notified 03/09/22  ?Time Charge Nurse/RN Notified 2142  ?Notify: Provider  ?Provider Name/Title Stark Klein  ?Date Provider Notified 03/09/22  ?Time Provider Notified 2153  ?Notification Type Page  ?Notification Reason Change in status;Other (Comment) ?(yellow mews, high HR; requested bisoprolol and singulair)  ?Provider response See new orders  ?Date of Provider Response 03/09/22  ?Time of Provider Response 2156  ?Assess: SIRS CRITERIA  ?SIRS Temperature  0  ?SIRS Pulse 1  ?SIRS Respirations  0  ?SIRS WBC 0  ?SIRS Score Sum  1  ? ? ?

## 2022-03-10 NOTE — Progress Notes (Signed)
Transition of Care (TOC) Screening Note ? ?Patient Details  ?Name: Tyler [name] ?Date of Birth: November 07, 1951 ? ?Transition of Care (TOC) CM/SW Contact:    ?Sherie Don, LCSW ?Phone Number: ?03/10/2022, 11:00 AM ? ?Transition of Care Department [facility]) has reviewed patient and no TOC needs have been identified at this time. We will continue to monitor patient advancement through interdisciplinary progression rounds. If new patient transition needs arise, please place a TOC consult. ?

## 2022-03-10 NOTE — Progress Notes (Signed)
?Subjective: ?Pt still with pelvic pain but better than over the weekend.  No new fevers.  Urinating ? ?Objective: ?Vital signs in last 24 hours: ?Temp:  [97.5 ?F (36.4 ?C)-99 ?F (37.2 ?C)] 97.5 ?F (36.4 ?C) (04/26 8588) ?Pulse Rate:  [67-124] 67 (04/26 0956) ?Resp:  [16-20] 16 (04/26 0956) ?BP: (100-137)/(64-87) 115/79 (04/26 5027) ?SpO2:  [90 %-100 %] 91 % (04/26 0558) ?Weight:  [77.2 kg] 77.2 kg (04/25 1410) ? ? ?Intake/Output from previous day: ?04/25 0701 - 04/26 0700 ?In: 1299.3 [P.O.:240; I.V.:725.3; IV Piggyback:334] ?Out: -  ?Intake/Output this shift: ?No intake/output data recorded. ? ? ?General appearance: alert and cooperative ?GI: soft, nondistended ? ?Lab Results:  ?Recent Labs  ?  03/09/22 ?1432 03/10/22 ?0438  ?WBC 10.1 8.1  ?HGB 15.2 12.9*  ?HCT 43.2 36.5*  ?PLT 139* 149*  ? ?BMET ?Recent Labs  ?  03/09/22 ?1432  ?NA 132*  ?K 3.4*  ?CL 98  ?CO2 18*  ?GLUCOSE 87  ?BUN 27*  ?CREATININE 1.27*  ?CALCIUM 9.1  ? ?PT/INR ?No results for input(s): LABPROT, INR in the last 72 hours. ?ABG ?No results for input(s): PHART, HCO3 in the last 72 hours. ? ?Invalid input(s): PCO2, PO2 ? ?MEDS, Scheduled ? amLODipine  5 mg Oral q AM  ? bisoprolol  10 mg Oral QHS  ? montelukast  10 mg Oral QHS  ? pantoprazole  40 mg Oral Daily  ? valACYclovir  500 mg Oral q AM  ? ? ?Studies/Results: ?CT ABDOMEN PELVIS W CONTRAST ? ?Result Date: 03/09/2022 ?CLINICAL DATA:  Fever abdominal pain and bloating. History of transient hemorrhoidal Dearterialization on April 21 EXAM: CT ABDOMEN AND PELVIS WITH CONTRAST TECHNIQUE: Multidetector CT imaging of the abdomen and pelvis was performed using the standard protocol following bolus administration of intravenous contrast. RADIATION DOSE REDUCTION: This exam was performed according to the departmental dose-optimization program which includes automated exposure control, adjustment of the mA and/or kV according to patient size and/or use of iterative reconstruction technique. CONTRAST:   141m OMNIPAQUE IOHEXOL 300 MG/ML  SOLN COMPARISON:  CT Mar 22, 2011 FINDINGS: Lower chest: Small bilateral pleural effusions with bibasilar atelectasis. Hepatobiliary: No suspicious hepatic lesion. Gallbladder is unremarkable. No biliary ductal dilation. Pancreas: No pancreatic ductal dilation or evidence of acute inflammation. Spleen: No splenomegaly or focal splenic lesion. Adrenals/Urinary Tract: Bilateral adrenal glands appear normal. No hydronephrosis. Kidneys demonstrate symmetric enhancement and excretion of contrast material. Urinary bladder is unremarkable for degree of distension. Stomach/Bowel: No radiopaque enteric contrast material was administered. Stomach is unremarkable for degree of distension. No pathologic dilation of small or large bowel. Colonic diverticulosis. Rectal wall thickening with gas tracking into the anterior rectal wall on sagittal image 95/6. Presacral fluid with stranding and gas in the mesorectal fat. Vascular/Lymphatic: Aortic atherosclerosis without abdominal aortic aneurysm. Reproductive: Prostate is unremarkable. Other: Small volume retroperitoneal and mesenteric free fluid. No walled off fluid collection. Musculoskeletal: Dextroconvex curvature of the thoracolumbar spine with associated degenerative change. 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 IMPRESSION: 1. Rectal wall thickening with gas tracking into the anterior rectal wall, presacral fluid as well as stranding and gas in the mesorectal fat, findings most consistent with rectal perforation. 2. Small volume retroperitoneal and mesenteric free fluid. No walled off fluid collection. 3. Small bilateral pleural effusions with bibasilar atelectasis. 4.  Aortic Atherosclerosis (ICD10-I70.0). Electronically Signed   By: JDahlia BailiffM.D.   On: 03/09/2022 16:15   ? ?Assessment: ?s/p  ?Patient Active Problem List  ?  Diagnosis Date Noted  ? Abdominal pain 03/09/2022  ? Pelvic pain in male 03/09/2022  ?  Pruritus ani 04/20/2018  ? Hx of adenomatous polyp of colon 04/07/2017  ? Perianal dermatitis 01/14/2016  ? Low testosterone 09/19/2013  ? Sinusitis 12/21/2012  ? 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 Eczema 10/30/2012  ? Prolapsed internal hemorrhoids, grade 3 10/30/2012  ? Hypertension 04/13/2011  ? [place], [place] 05/14/2010  ? NEOPLASM, SKIN, UNCERTAIN BEHAVIOR 46/28/6381  ? ALLERGIC RHINITIS 08/16/2008  ? Rash and other nonspecific skin eruption 07/02/2008  ? ABNORMAL COAGULATION PROFILE 05/29/2008  ? GERD 06/28/2007  ? PITYRIASIS [place] 06/28/2007  ? OSTEOARTHROSIS, LOCAL NOS, LOWER LEG 06/28/2007  ? Asthma, chronic 05/11/2007  ? ? ?Post op pelvic inflammation.  CT scan reviewed.  Pt does have some extraluminal air tracking into mesenteric space but no free peritoneal fluid and wbc normal.   ? ?Plan: ?Cont NPO and bowel rest for now ?Restart home meds ?Acute dehydration related to poor PO intake and nausea: Cont IVF's for now ?Cont IV antibiotics in case pt does have pelvic infection ? ?Sitz baths, IV dilaudid and Robaxin for pain control ? ? ? LOS: 0 days  ? ? ? ?Marland KitchenRosario Adie, MD ?[facility], [place] ? ? ? ?03/10/2022 ?10:22 AM ? ? ? ?  ?

## 2022-03-11 NOTE — Progress Notes (Signed)
Patient has unwitnessed fall. Pt was calling for help and found pt n the bathroom floor by CNA. Pt stated that he was using BR and trying to clean himself while standing up and loses his balance and fell on his buttocks. Pt has been c/o lower back pain before fall but no other changes noted after the fall. No bruise or other skin issue noted. VSS. Pt stated that he has been walking to the bathroom by himself this morning with a walker. Health teaching and education given regarding importance of calling assistance when getting up and risk of falling - pt verbalized understanding; provider made aware of the incident, no new order at this time,  CN informed; pt's call bell within reach, bed in low position, bed alarm on, will continue to monitor pt ?

## 2022-03-11 NOTE — Progress Notes (Signed)
?  Subjective: ?Pt still with pelvic pain, suprapubic pain.  No new fevers.  Urinating better ? ?Objective: ?Vital signs in last 24 hours: ?Temp:  [97.9 ?F (36.6 ?C)-99.7 ?F (37.6 ?C)] 98.5 ?F (36.9 ?C) (04/27 1315) ?Pulse Rate:  [90-105] 90 (04/27 1315) ?Resp:  [18-19] 18 (04/27 1315) ?BP: (105-118)/(68-77) 118/77 (04/27 1315) ?SpO2:  [91 %-97 %] 94 % (04/27 1315) ? ? ?Intake/Output from previous day: ?04/26 0701 - 04/27 0700 ?In: 3015.2 [P.O.:180; I.V.:2162.5; IV Piggyback:672.6] ?Out: 0  ?Intake/Output this shift: ?Total I/O ?In: 733.4 [I.V.:733.2; IV Piggyback:0.1] ?Out: -  ? ? ?General appearance: alert and cooperative ?GI: soft, nondistended ? ?Lab Results:  ?Recent Labs  ?  03/09/22 ?1432 03/10/22 ?0438  ?WBC 10.1 8.1  ?HGB 15.2 12.9*  ?HCT 43.2 36.5*  ?PLT 139* 149*  ? ? ?BMET ?Recent Labs  ?  03/09/22 ?1432  ?NA 132*  ?K 3.4*  ?CL 98  ?CO2 18*  ?GLUCOSE 87  ?BUN 27*  ?CREATININE 1.27*  ?CALCIUM 9.1  ? ? ?PT/INR ?No results for input(s): LABPROT, INR in the last 72 hours. ?ABG ?No results for input(s): PHART, HCO3 in the last 72 hours. ? ?Invalid input(s): PCO2, PO2 ? ?MEDS, Scheduled ? amLODipine  5 mg Oral q AM  ? bisoprolol  10 mg Oral QHS  ? montelukast  10 mg Oral QHS  ? pantoprazole  40 mg Oral Daily  ? valACYclovir  500 mg Oral q AM  ? ? ?Studies/Results: ?No results found. ? ?Assessment: ?s/p  ?Patient Active Problem List  ? Diagnosis Date Noted  ? Abdominal pain 03/09/2022  ? Pelvic pain in male 03/09/2022  ? Pruritus ani 04/20/2018  ? Hx of adenomatous polyp of colon 04/07/2017  ? Perianal dermatitis 01/14/2016  ? Low testosterone 09/19/2013  ? Sinusitis 12/21/2012  ? 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 Eczema 10/30/2012  ? Prolapsed internal hemorrhoids, grade 3 10/30/2012  ? Hypertension 04/13/2011  ? [place], [place] 05/14/2010  ? NEOPLASM, SKIN, UNCERTAIN BEHAVIOR 81/44/8185  ? ALLERGIC RHINITIS 08/16/2008  ? Rash and other nonspecific skin eruption 07/02/2008  ? ABNORMAL COAGULATION PROFILE  05/29/2008  ? GERD 06/28/2007  ? PITYRIASIS [place]SEA 06/28/2007  ? OSTEOARTHROSIS, LOCAL NOS, LOWER LEG 06/28/2007  ? Asthma, chronic 05/11/2007  ? ? ?Post op pelvic inflammation.  CT scan reviewed.  Pt does have some extraluminal air tracking into mesenteric space but no free peritoneal fluid and wbc normal.   ? ?Plan: ?Cont NPO and bowel rest for now ?Cont home meds ?Acute dehydration related to poor PO intake and nausea: Cont IVF's for now, UOP better, decrease rate ?Cont IV antibiotics in case pt does have pelvic infection ?Recheck cbc in AM.  If still looks ok, will start PO tom ? ?Sitz baths, IV dilaudid and Robaxin for pain control ? ? ? LOS: 1 day  ? ? ? ?Marland KitchenRosario Adie, MD ?[facility], [place] ? ? ? ?03/11/2022 ?6:46 PM ? ? ? ?  ?

## 2022-03-12 ENCOUNTER — Inpatient Hospital Stay: Payer: Self-pay

## 2022-03-12 DIAGNOSIS — J4530 Mild persistent asthma, uncomplicated: Secondary | ICD-10-CM | POA: Insufficient documentation

## 2022-03-12 DIAGNOSIS — R338 Other retention of urine: Secondary | ICD-10-CM

## 2022-03-12 DIAGNOSIS — N529 Male erectile dysfunction, unspecified: Secondary | ICD-10-CM | POA: Insufficient documentation

## 2022-03-12 LAB — CBC WITH DIFFERENTIAL/PLATELET
Abs Immature Granulocytes: 1.06 10*3/uL — ABNORMAL HIGH (ref 0.00–0.07)
Basophils Absolute: 0.2 10*3/uL — ABNORMAL HIGH (ref 0.0–0.1)
Basophils Relative: 1 %
Eosinophils Absolute: 0 10*3/uL (ref 0.0–0.5)
Eosinophils Relative: 0 %
HCT: 32.4 % — ABNORMAL LOW (ref 39.0–52.0)
Hemoglobin: 11.3 g/dL — ABNORMAL LOW (ref 13.0–17.0)
Immature Granulocytes: 5 %
Lymphocytes Relative: 2 %
Lymphs Abs: 0.5 10*3/uL — ABNORMAL LOW (ref 0.7–4.0)
MCH: 34.7 pg — ABNORMAL HIGH (ref 26.0–34.0)
MCHC: 34.9 g/dL (ref 30.0–36.0)
MCV: 99.4 fL (ref 80.0–100.0)
Monocytes Absolute: 0.6 10*3/uL (ref 0.1–1.0)
Monocytes Relative: 3 %
Neutro Abs: 18.1 10*3/uL — ABNORMAL HIGH (ref 1.7–7.7)
Neutrophils Relative %: 89 %
Platelets: 74 10*3/uL — ABNORMAL LOW (ref 150–400)
RBC: 3.26 MIL/uL — ABNORMAL LOW (ref 4.22–5.81)
RDW: 14 % (ref 11.5–15.5)
WBC: 20.4 10*3/uL — ABNORMAL HIGH (ref 4.0–10.5)
nRBC: 0 % (ref 0.0–0.2)

## 2022-03-12 LAB — PHOSPHORUS: Phosphorus: 3.1 mg/dL (ref 2.5–4.6)

## 2022-03-12 LAB — BASIC METABOLIC PANEL
Anion gap: 12 (ref 5–15)
BUN: 65 mg/dL — ABNORMAL HIGH (ref 8–23)
CO2: 15 mmol/L — ABNORMAL LOW (ref 22–32)
Calcium: 8.1 mg/dL — ABNORMAL LOW (ref 8.9–10.3)
Chloride: 99 mmol/L (ref 98–111)
Creatinine, Ser: 2.21 mg/dL — ABNORMAL HIGH (ref 0.61–1.24)
GFR, Estimated: 31 mL/min — ABNORMAL LOW (ref >60)
Glucose, Bld: 93 mg/dL (ref 70–99)
Potassium: 4.5 mmol/L (ref 3.5–5.1)
Sodium: 126 mmol/L — ABNORMAL LOW (ref 135–145)

## 2022-03-12 LAB — MAGNESIUM: Magnesium: 2.4 mg/dL (ref 1.7–2.4)

## 2022-03-12 LAB — GLUCOSE, CAPILLARY
Glucose-Capillary: 126 mg/dL — ABNORMAL HIGH (ref 70–99)
Glucose-Capillary: 97 mg/dL (ref 70–99)

## 2022-03-12 MED ORDER — SODIUM CHLORIDE 0.9% FLUSH
10.0000 mL | Freq: Two times a day (BID) | INTRAVENOUS | Status: DC
  Administered 2022-03-12 – 2022-03-16 (×5): 10 mL
  Administered 2022-03-17: 20 mL
  Administered 2022-03-17 – 2022-03-18 (×2): 10 mL
  Administered 2022-03-18 – 2022-03-19 (×2): 30 mL
  Administered 2022-03-20 – 2022-03-27 (×13): 10 mL
  Administered 2022-03-27: 20 mL
  Administered 2022-03-28 – 2022-03-30 (×5): 10 mL
  Administered 2022-03-31: 30 mL
  Administered 2022-03-31 – 2022-04-01 (×2): 10 mL
  Administered 2022-04-01: 30 mL
  Administered 2022-04-02 – 2022-04-08 (×13): 10 mL

## 2022-03-12 MED ORDER — KCL IN DEXTROSE-NACL 20-5-0.45 MEQ/L-%-% IV SOLN
INTRAVENOUS | Status: AC
  Filled 2022-03-12 (×2): qty 1000

## 2022-03-12 MED ORDER — METOPROLOL TARTRATE 5 MG/5ML IV SOLN
5.0000 mg | Freq: Four times a day (QID) | INTRAVENOUS | Status: DC | PRN
  Administered 2022-03-18 – 2022-03-24 (×3): 5 mg via INTRAVENOUS
  Filled 2022-03-12 (×3): qty 5

## 2022-03-12 MED ORDER — TRAVASOL 10 % IV SOLN
INTRAVENOUS | Status: AC
  Filled 2022-03-12: qty 480

## 2022-03-12 MED ORDER — KETOROLAC TROMETHAMINE 15 MG/ML IJ SOLN: 15.0000 mg | Freq: Four times a day (QID) | INTRAMUSCULAR | Status: DC | PRN

## 2022-03-12 MED ORDER — PROCHLORPERAZINE EDISYLATE 10 MG/2ML IJ SOLN
5.0000 mg | INTRAMUSCULAR | Status: DC | PRN
  Filled 2022-03-12: qty 2

## 2022-03-12 MED ORDER — SODIUM CHLORIDE 0.9 % IV SOLN
8.0000 mg | Freq: Four times a day (QID) | INTRAVENOUS | Status: DC | PRN
  Filled 2022-03-12: qty 4

## 2022-03-12 MED ORDER — METHOCARBAMOL 1000 MG/10ML IJ SOLN
1000.0000 mg | Freq: Four times a day (QID) | INTRAVENOUS | Status: DC | PRN
  Filled 2022-03-12: qty 10

## 2022-03-12 MED ORDER — GUAIFENESIN-DM 100-10 MG/5ML PO SYRP: 15.0000 mL | ORAL_SOLUTION | ORAL | Status: DC | PRN

## 2022-03-12 MED ORDER — DIPHENHYDRAMINE HCL 50 MG/ML IJ SOLN
12.5000 mg | Freq: Four times a day (QID) | INTRAMUSCULAR | Status: DC | PRN
  Administered 2022-03-13 – 2022-03-28 (×10): 25 mg via INTRAVENOUS
  Filled 2022-03-12 (×10): qty 1

## 2022-03-12 MED ORDER — CHLORHEXIDINE GLUCONATE CLOTH 2 % EX PADS
6.0000 | MEDICATED_PAD | Freq: Every day | CUTANEOUS | Status: DC
  Administered 2022-03-12 – 2022-03-15 (×4): 6 via TOPICAL

## 2022-03-12 MED ORDER — KETOROLAC TROMETHAMINE 15 MG/ML IJ SOLN
15.0000 mg | Freq: Three times a day (TID) | INTRAMUSCULAR | Status: DC
  Administered 2022-03-12 – 2022-03-16 (×13): 15 mg via INTRAVENOUS
  Filled 2022-03-12 (×14): qty 1

## 2022-03-12 MED ORDER — INSULIN ASPART 100 UNIT/ML IJ SOLN
0.0000 [IU] | INTRAMUSCULAR | Status: DC
  Administered 2022-03-13 (×3): 1 [IU] via SUBCUTANEOUS

## 2022-03-12 MED ORDER — MAGIC MOUTHWASH
15.0000 mL | Freq: Four times a day (QID) | ORAL | Status: DC | PRN
  Filled 2022-03-12: qty 15

## 2022-03-12 MED ORDER — MENTHOL 3 MG MT LOZG
1.0000 | LOZENGE | OROMUCOSAL | Status: DC | PRN
  Administered 2022-03-17 – 2022-03-23 (×2): 3 mg via ORAL
  Filled 2022-03-12: qty 9

## 2022-03-12 MED ORDER — PHENOL 1.4 % MT LIQD
2.0000 | OROMUCOSAL | Status: DC | PRN
Start: 2022-03-12 — End: 2022-03-12

## 2022-03-12 MED ORDER — ONDANSETRON HCL 4 MG/2ML IJ SOLN
4.0000 mg | Freq: Four times a day (QID) | INTRAMUSCULAR | Status: DC | PRN
  Administered 2022-03-24: 4 mg via INTRAVENOUS
  Filled 2022-03-12: qty 2

## 2022-03-12 MED ORDER — ACETAMINOPHEN 500 MG PO TABS
1000.0000 mg | ORAL_TABLET | Freq: Four times a day (QID) | ORAL | Status: DC
  Administered 2022-03-12 – 2022-03-19 (×25): 1000 mg via ORAL
  Filled 2022-03-12 (×27): qty 2

## 2022-03-12 MED ORDER — CALCIUM POLYCARBOPHIL 625 MG PO TABS: 625.0000 mg | ORAL_TABLET | Freq: Two times a day (BID) | ORAL | Status: DC

## 2022-03-12 MED ORDER — SIMETHICONE 40 MG/0.6ML PO SUSP
80.0000 mg | Freq: Four times a day (QID) | ORAL | Status: DC | PRN
  Administered 2022-03-16 – 2022-03-24 (×9): 80 mg via ORAL
  Filled 2022-03-12 (×15): qty 1.2

## 2022-03-12 MED ORDER — GABAPENTIN 100 MG PO CAPS: 200.0000 mg | ORAL_CAPSULE | Freq: Three times a day (TID) | ORAL | Status: DC

## 2022-03-12 MED ORDER — LIP MEDEX EX OINT
1.0000 "application " | TOPICAL_OINTMENT | Freq: Two times a day (BID) | CUTANEOUS | Status: DC
  Administered 2022-03-12 – 2022-04-09 (×56): 1 via TOPICAL
  Filled 2022-03-12 (×4): qty 7

## 2022-03-12 MED ORDER — ALUM & MAG HYDROXIDE-SIMETH 200-200-20 MG/5ML PO SUSP: 30.0000 mL | Freq: Four times a day (QID) | ORAL | Status: DC | PRN

## 2022-03-12 MED ORDER — SODIUM CHLORIDE 0.9% FLUSH
10.0000 mL | INTRAVENOUS | Status: DC | PRN
  Administered 2022-03-25: 10 mL

## 2022-03-12 MED ORDER — DIAZEPAM 5 MG PO TABS: 5.0000 mg | ORAL_TABLET | Freq: Four times a day (QID) | ORAL | Status: DC | PRN

## 2022-03-12 MED ORDER — LACTATED RINGERS IV BOLUS
1000.0000 mL | Freq: Three times a day (TID) | INTRAVENOUS | Status: DC | PRN
Start: 2022-03-12 — End: 2022-03-12

## 2022-03-12 MED ORDER — ENALAPRILAT 1.25 MG/ML IV SOLN
0.6250 mg | Freq: Four times a day (QID) | INTRAVENOUS | Status: DC | PRN
  Filled 2022-03-12: qty 1

## 2022-03-12 NOTE — Progress Notes (Signed)
?Subjective: ?Had urinary retention overnight, foley placed.  No fevers.  Pelvic pain is better ? ?Objective: ?Vital signs in last 24 hours: ?Temp:  [97.9 ?F (36.6 ?C)-98.5 ?F (36.9 ?C)] 97.9 ?F (36.6 ?C) (04/28 9381) ?Pulse Rate:  [84-90] 84 (04/28 0610) ?Resp:  [18] 18 (04/28 0610) ?BP: (117-125)/(71-79) 125/79 (04/28 0610) ?SpO2:  [91 %-95 %] 95 % (04/28 0610) ? ? ?Intake/Output from previous day: ?04/27 0701 - 04/28 0700 ?In: 2046.1 [P.O.:200; I.V.:1846; IV Piggyback:0.1] ?Out: Winside [WEXHB:7169] ?Intake/Output this shift: ?No intake/output data recorded. ? ? ?General appearance: alert and cooperative ?GI: soft, nondistended ? ?Lab Results:  ?Recent Labs  ?  03/10/22 ?0438 03/12/22 ?0416  ?WBC 8.1 20.4*  ?HGB 12.9* 11.3*  ?HCT 36.5* 32.4*  ?PLT 149* 74*  ? ? ?BMET ?Recent Labs  ?  03/09/22 ?1432  ?NA 132*  ?K 3.4*  ?CL 98  ?CO2 18*  ?GLUCOSE 87  ?BUN 27*  ?CREATININE 1.27*  ?CALCIUM 9.1  ? ? ?PT/INR ?No results for input(s): LABPROT, INR in the last 72 hours. ?ABG ?No results for input(s): PHART, HCO3 in the last 72 hours. ? ?Invalid input(s): PCO2, PO2 ? ?MEDS, Scheduled ? acetaminophen  1,000 mg Oral Q6H  ? amLODipine  5 mg Oral q AM  ? bisoprolol  10 mg Oral QHS  ? Chlorhexidine Gluconate Cloth  6 each Topical Daily  ? gabapentin  200 mg Oral TID  ? ketorolac  15 mg Intravenous Q8H  ? lip balm  1 application. Topical BID  ? montelukast  10 mg Oral QHS  ? pantoprazole  40 mg Oral Daily  ? polycarbophil  625 mg Oral BID  ? valACYclovir  500 mg Oral q AM  ? ? ?Studies/Results: ?[place] EKG SITE RITE ? ?Result Date: 03/12/2022 ?If [employer] not attached, placement could not be confirmed due to current cardiac rhythm.  ? ?Assessment: ?s/p  ?Patient Active Problem List  ? Diagnosis Date Noted  ? Acute urinary retention 03/12/2022  ? 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 Mild persistent asthma 03/12/2022  ? Abdominal pain 03/09/2022  ? Pelvic pain in male 03/09/2022  ? Pruritus ani 04/20/2018   ? Arthritis of both knees 01/13/2018  ? Hx of adenomatous polyp of colon 04/07/2017  ? Perianal dermatitis 01/14/2016  ? 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/12/2013  ? History of tobacco use 11/12/2013  ? Low testosterone 09/19/2013  ? Sinusitis 12/21/2012  ? 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 Eczema 10/30/2012  ? Prolapsed internal hemorrhoids, grade 3 10/30/2012  ? Hypertension 04/13/2011  ? [place], [place] 05/14/2010  ? NEOPLASM, SKIN, UNCERTAIN BEHAVIOR 67/89/3810  ? ALLERGIC RHINITIS 08/16/2008  ? Rash and other nonspecific skin eruption 07/02/2008  ? ABNORMAL COAGULATION PROFILE 05/29/2008  ? Gastroesophageal reflux disease 06/28/2007  ? PITYRIASIS [place] 06/28/2007  ? OSTEOARTHROSIS, LOCAL NOS, LOWER LEG 06/28/2007  ? Asthma, chronic 05/11/2007  ? ? ?Post op pelvic inflammation.  CT scan reviewed.  Pt does have some extraluminal air tracking into mesenteric space but no free peritoneal fluid and wbc normal.   ? ?Plan: ?Cont NPO and bowel rest for now ?Cont home meds ?Will start TPN due to inability to tolerate PO for the foreseeable future ?Acute dehydration related to poor PO intake and nausea: resolved ?Cont IV antibiotics due to pelvic infection ?Recheck cbc in AM.   ?Recheck CT scan pelvis on Sun  ? ?Sitz baths, IV dilaudid  ? ? ? LOS: 2 days  ? ? ? ?Marland KitchenRosario Adie,  MD ?[facility], [place] ? ? ? ?03/12/2022 ?7:37 AM ? ? ? ?  ?

## 2022-03-12 NOTE — Progress Notes (Signed)
Pharmacy Antibiotic Note ? ?Tyler [name] is a 71 y.o. male with hx rectal bleeding and prolapsed internal hemorrhoids who underwent transanal hemorrhoidal dearterialization on 03/05/22.  He presented to the ED on 03/09/2022 with c/o fever, abdominal pain and n/v. Abd CT on 03/09/22 showed findings with concern for rectal perforation. Pharmacy has been consulted to dose cefepime for infection. ? ?WBC increased from 8 (4/26) to 20 today. Patient is afebrile. ? ?Plan: ?- Continue Cefepime 2gm IV q12h ?- Continue Flagyl 500 mg IV q12h per MD (stop date for 5/2) ? ?__________________________________ ? ?Height: '5\' 9"'$  (175.3 cm) ?Weight: 77.2 kg (170 lb 4.8 oz) ?IBW/kg (Calculated) : 70.7 ? ?Temp (24hrs), Avg:98.2 ?F (36.8 ?C), Min:97.9 ?F (36.6 ?C), Max:98.5 ?F (36.9 ?C) ? ?Recent Labs  ?Lab 03/09/22 ?1432 03/10/22 ?0438 03/12/22 ?0416 03/12/22 ?0631  ?WBC 10.1 8.1 20.4*  --   ?CREATININE 1.27*  --   --  2.21*  ? ?  ?Estimated Creatinine Clearance: 31.5 mL/min (A) (by C-G formula based on SCr of 2.21 mg/dL (H)).   ? ?Allergies  ?Allergen Reactions  ? Fluconazole Hives, Rash and Other (See Comments)  ?  Shingles activated ?  ? Griseofulvin Anaphylaxis, Swelling, Rash and Other (See Comments)  ?  Throat swelling ? ?  ? Penicillins Rash and Other (See Comments)  ?  High fever  ? Sulfa Antibiotics Other (See Comments)  ?  Joints ache, swell and caused inflammation ?  ? Mometasone Furo-Formoterol Fum Other (See Comments)  ?  Lack of therapeutic effect ?  ? Oxycodone Nausea Only and Other (See Comments)  ?  Nauseous to the point of almost vomiting  ? Retapamulin Rash  ? ? ? ?Thank you for allowing pharmacy to be a part of this patient?s care. ? ?Dimple Nanas, PharmD ?03/12/2022 11:39 AM ? ?

## 2022-03-12 NOTE — Progress Notes (Signed)
0245 Pt requested to use the urinal, just dribbles, urine dark/concentrated. Said that he has the sensation to urinate some more but nothing comes out anymore. Bladder very distended, and had pain/discomfort on his suprapubic area. Bladder scan done, which shows >926m in the bladder. Dr SClyda Greenernotified and he ordered to place foley catheter. Foley cath placed aseptically, obtained [id number] urine. ?

## 2022-03-12 NOTE — Progress Notes (Addendum)
PHARMACY - TOTAL PARENTERAL NUTRITION CONSULT NOTE  ? ?Indication: rectal perforation, prolonged NPO ? ?Patient Measurements: ?Height: '5\' 9"'$  (175.3 cm) ?Weight: 77.2 kg (170 lb 4.8 oz) ?IBW/kg (Calculated) : 70.7 ?TPN AdjBW (KG): 77.2 ?Body mass index is 25.15 kg/m?. ? ?Assessment: 71 yo male with history of rectal bleeding and prolapsed internal hemorrhoids who recently underwent hemorrhoid surgery w/ hemorrhoidal dearterialization on 03/05/22.  He presented to [facility] on 4/25 with increasing abdominal pain, nausea, and fever.  CT abdomen/pelvis with thickening of rectal wall; gas and fluid within the rectal wall and in presacral space concerning for rectal perforation.  Pharmacy has been consulted to manage TPN as patient unable to tolerate PO currently and for the foreseeable future.  ? ?Glucose / Insulin: no history of DM, CBGs < 100. No SSI prior to TPN initiation. ?Electrolytes: Na low 126, CoCa borderline low at 8.6, other lytes wnl ?Renal: Scr inc to 2.21, baseline appears to be <1. Inc Scr likely attributed to urinary retention that resolved with catheter placement.   ?Hepatic: Labs from 4/25: ALT slightly elev at 54, Tbili inc 3.1, albumin low 3.4 ?Intake / Output: 200 mL PO intake documented yesterday ?- UOP 1778 mL yesterday and 1022m thus far today; LBM 4/28 ?MIVF: D5 + 1/2 NS and Kcl 20 mEq/L at 75 mL/hr  ?GI Imaging: ?- 4/25 CTAP: rectal wall thickening w/ gas tracking into the anterior rectal wall, presacral fluid as well as stranding and gas in the mesorectal fat; consistent w/ rectal perforation ?GI Surgeries / Procedures:  ?- 4/21 (prior to admit): hemorrhoid surgery w/ a dearterialization THD procedure ? ?Central access: ordered to be placed 4/28 ?TPN start date: 03/12/22 ? ?Nutritional Goals: ?Goal TPN rate is 80 mL/hr (provides 96 g of protein and 1959 kcals per day) ? ?RD Assessment: d/w RD ? 1900-2100 kcal/day ?90-100g AA ? ?Current Nutrition:  ?NPO except for sips w/ meds ?TPN to start 4/28  PM ? ?Plan:  ?Start TPN at half goal rate of 412mhr at 1800 ?Electrolytes in TPN:  ?Na 5062mL ?K 62m41m (reduced slightly since receiving Kcl fluids) ?Ca 5mEq1m?Mg 3mEq/75mPhos 15mmol41mCl:Ac 1:1 ?Add standard MVI and trace elements to TPN ?Initiate Sensitive q6h SSI and adjust as needed  ?Reduce MIVF to 30 mL/hr at 1800 or per MD ?Monitor TPN labs on Mon/Thurs ?Check CMET, Mg, Phos, TG  4/29 ? ?Mary-AsDimple NanasD ?03/12/2022 8:44 AM ? ?

## 2022-03-12 NOTE — Progress Notes (Signed)
Initial Nutrition Assessment ? ?INTERVENTION:  ? ?-TPN management per Pharmacy ? ?-Will monitor for diet advancement ? ?NUTRITION DIAGNOSIS:  ? ?Inadequate oral intake related to nausea, vomiting as evidenced by per patient/family report. ? ?GOAL:  ? ?Patient will meet greater than or equal to 90% of their needs ? ?MONITOR:  ? ?PO intake, Supplement acceptance, Labs, Weight trends, I & O's ? ?REASON FOR ASSESSMENT:  ? ?Consult ?New TPN/TNA ? ?ASSESSMENT:  ? ?71 y.o. male with a past medical history of hypertension, GERD, status post appendectomy, status post inguinal hernia repair, status post transanal hemorrhoid dearterialization (03/05/2022 performed by Dr. Marcello Moores).  Presents to the emergency department with a complaint of abdominal pain, nausea, vomiting, and fever. ? ?Patient in room, states he feels like he cannot think. He is happy he is having some nutrition started this evening. States he has started to feel hungry which hasn't been the case since his hemorrhoid surgery on 4/21. Pt started to have N/V and fevers following surgery. States his last solid food intake was 2 eggs on 4/22. Was not able to take in much liquids either. Reports prior to his surgery he had a good appetite, ate 2-3 meals daily, consumed meats but no fish.  ?Denies issues with swallowing or chewing lately.  ? ?TPN to begin at 40 ml/hr today, providing ~979 kcals and 48g protein. ? ?Medications: D5 infusion ? ?Labs reviewed: ?Low Na  ? ?NUTRITION - FOCUSED PHYSICAL EXAM: ? ?No depletions noted. ? ?Diet Order:   ?Diet Order   ? ?       ?  Diet NPO time specified Except for: [employer], Sips with Meds, Other (See Comments)  Diet effective now       ?  ? ?  ?  ? ?  ? ? ?EDUCATION NEEDS:  ? ?No education needs have been identified at this time ? ?Skin:  Skin Assessment: Reviewed RN Assessment ? ?Last BM:  4/28 -type 6 ? ?Height:  ? ?Ht Readings from Last 1 Encounters:  ?03/09/22 '5\' 9"'$  (1.753 m)  ? ? ?Weight:  ? ?Wt Readings from Last 1  Encounters:  ?03/09/22 77.2 kg  ? ? ?BMI:  Body mass index is 25.15 kg/m?. ? ?Estimated Nutritional Needs:  ? ?Kcal:  1900-2100 ? ?Protein:  90-100g ? ?Fluid:  2L/day ? ? ?Clayton Bibles, MS, RD, LDN ?Inpatient Clinical Dietitian ?Contact information available via Amion ? ?

## 2022-03-12 NOTE — Progress Notes (Signed)
Peripherally Inserted Central Catheter Placement ? ?The IV Nurse has discussed with the patient and/or persons authorized to consent for the patient, the purpose of this procedure and the potential benefits and risks involved with this procedure.  The benefits include less needle sticks, lab draws from the catheter, and the patient may be discharged home with the catheter. Risks include, but not limited to, infection, bleeding, blood clot (thrombus formation), and puncture of an artery; nerve damage and irregular heartbeat and possibility to perform a PICC exchange if needed/ordered by physician.  Alternatives to this procedure were also discussed.  Bard Power PICC patient education guide, fact sheet on infection prevention and patient information card has been provided to patient /or left at bedside.   ? ?PICC Placement Documentation  ?PICC Double Lumen 03/12/22 Right Brachial 38 cm 0 cm (Active)  ?Indication for Insertion or Continuance of Line Administration of hyperosmolar/irritating solutions (i.e. TPN, Vancomycin, etc.) 03/12/22 1630  ?Exposed Catheter (cm) 0 cm 03/12/22 1630  ?Site Assessment Clean, Dry, Intact 03/12/22 1630  ?Lumen #1 Status Flushed;Saline locked;Blood return noted 03/12/22 1630  ?Lumen #2 Status Flushed;Saline locked;Blood return noted 03/12/22 1630  ?Dressing Type Securing device;Transparent 03/12/22 1630  ?Dressing Status Antimicrobial disc in place;Clean, Dry, Intact 03/12/22 1630  ?Dressing Intervention New dressing 03/12/22 1630  ?Dressing Change Due 03/19/22 03/12/22 1630  ? ? ? ? ? ?Shon Hale ?03/12/2022, 4:50 PM ? ?

## 2022-03-13 LAB — PHOSPHORUS: Phosphorus: 2.8 mg/dL (ref 2.5–4.6)

## 2022-03-13 LAB — CBC
HCT: 32.6 % — ABNORMAL LOW (ref 39.0–52.0)
Hemoglobin: 11.6 g/dL — ABNORMAL LOW (ref 13.0–17.0)
MCH: 34.1 pg — ABNORMAL HIGH (ref 26.0–34.0)
MCHC: 35.6 g/dL (ref 30.0–36.0)
MCV: 95.9 fL (ref 80.0–100.0)
Platelets: 91 10*3/uL — ABNORMAL LOW (ref 150–400)
RBC: 3.4 MIL/uL — ABNORMAL LOW (ref 4.22–5.81)
RDW: 13.9 % (ref 11.5–15.5)
WBC: 25.5 10*3/uL — ABNORMAL HIGH (ref 4.0–10.5)
nRBC: 0 % (ref 0.0–0.2)

## 2022-03-13 LAB — COMPREHENSIVE METABOLIC PANEL
ALT: 28 U/L (ref 0–44)
AST: 26 U/L (ref 15–41)
Albumin: 2 g/dL — ABNORMAL LOW (ref 3.5–5.0)
Alkaline Phosphatase: 132 U/L — ABNORMAL HIGH (ref 38–126)
Anion gap: 8 (ref 5–15)
BUN: 52 mg/dL — ABNORMAL HIGH (ref 8–23)
CO2: 19 mmol/L — ABNORMAL LOW (ref 22–32)
Calcium: 8 mg/dL — ABNORMAL LOW (ref 8.9–10.3)
Chloride: 103 mmol/L (ref 98–111)
Creatinine, Ser: 1.28 mg/dL — ABNORMAL HIGH (ref 0.61–1.24)
GFR, Estimated: >60 mL/min (ref >60)
Glucose, Bld: 115 mg/dL — ABNORMAL HIGH (ref 70–99)
Potassium: 3.1 mmol/L — ABNORMAL LOW (ref 3.5–5.1)
Sodium: 130 mmol/L — ABNORMAL LOW (ref 135–145)
Total Bilirubin: 2.7 mg/dL — ABNORMAL HIGH (ref 0.3–1.2)
Total Protein: 4.9 g/dL — ABNORMAL LOW (ref 6.5–8.1)

## 2022-03-13 LAB — GLUCOSE, CAPILLARY
Glucose-Capillary: 109 mg/dL — ABNORMAL HIGH (ref 70–99)
Glucose-Capillary: 132 mg/dL — ABNORMAL HIGH (ref 70–99)
Glucose-Capillary: 92 mg/dL (ref 70–99)
Glucose-Capillary: 135 mg/dL — ABNORMAL HIGH (ref 70–99)
Glucose-Capillary: 107 mg/dL — ABNORMAL HIGH (ref 70–99)

## 2022-03-13 LAB — TRIGLYCERIDES: Triglycerides: 96 mg/dL (ref <150)

## 2022-03-13 LAB — MAGNESIUM: Magnesium: 1.9 mg/dL (ref 1.7–2.4)

## 2022-03-13 MED ORDER — DEXTROSE 10 % IV SOLN: INTRAVENOUS | Status: AC

## 2022-03-13 MED ORDER — POTASSIUM CHLORIDE 10 MEQ/50ML IV SOLN
10.0000 meq | INTRAVENOUS | Status: AC
  Administered 2022-03-13 (×4): 10 meq via INTRAVENOUS
  Filled 2022-03-13 (×4): qty 50

## 2022-03-13 MED ORDER — TRAVASOL 10 % IV SOLN
INTRAVENOUS | Status: DC
  Filled 2022-03-13: qty 720

## 2022-03-13 NOTE — Progress Notes (Signed)
? ?  Subjective/Chief Complaint: ?Pt about the same  ? ? ?Objective: ?Vital signs in last 24 hours: ?Temp:  [97.8 ?F (36.6 ?C)-98.8 ?F (37.1 ?C)] 98.8 ?F (37.1 ?C) (04/29 0533) ?Pulse Rate:  [73-77] 77 (04/29 0533) ?Resp:  [15-18] 16 (04/29 0533) ?BP: (117-129)/(74-78) 129/78 (04/29 0533) ?SpO2:  [93 %-98 %] 93 % (04/29 0533) ?Last BM Date : 03/12/22 ? ?Intake/Output from previous day: ?04/28 0701 - 04/29 0700 ?In: 1677.5 [P.O.:150; I.V.:1327.5; IV Piggyback:200] ?Out: 5200 [Urine:5200] ?Intake/Output this shift: ?Total I/O ?In: -  ?Out: 1300 [Urine:1300] ? ?Abdomen: mild tenderness to suprapubic area  ?Perineum : no cellulitis or foul smelling drainage  ? ?Lab Results:  ?Recent Labs  ?  03/12/22 ?0416 03/13/22 ?9826  ?WBC 20.4* 25.5*  ?HGB 11.3* 11.6*  ?HCT 32.4* 32.6*  ?PLT 74* 91*  ? ?BMET ?Recent Labs  ?  03/12/22 ?0631 03/13/22 ?4158  ?NA 126* 130*  ?K 4.5 3.1*  ?CL 99 103  ?CO2 15* 19*  ?GLUCOSE 93 115*  ?BUN 65* 52*  ?CREATININE 2.21* 1.28*  ?CALCIUM 8.1* 8.0*  ? ?PT/INR ?No results for input(s): LABPROT, INR in the last 72 hours. ?ABG ?No results for input(s): PHART, HCO3 in the last 72 hours. ? ?Invalid input(s): PCO2, PO2 ? ?Studies/Results: ?[place] EKG SITE RITE ? ?Result Date: 03/12/2022 ?If [employer] not attached, placement could not be confirmed due to current cardiac rhythm.  ? ?Anti-infectives: ?Anti-infectives (From admission, onward)  ? ? Start     Dose/Rate Route Frequency Ordered Stop  ? 03/10/22 1400  valACYclovir (VALTREX) tablet 500 mg       ? 500 mg Oral Every morning 03/10/22 1021    ? 03/10/22 0500  ceFEPIme (MAXIPIME) 2 g in sodium chloride 0.9 % 100 mL IVPB       ? 2 g ?200 mL/hr over 30 Minutes Intravenous Every 12 hours 03/09/22 1750    ? 03/10/22 0400  metroNIDAZOLE (FLAGYL) IVPB 500 mg       ? 500 mg ?100 mL/hr over 60 Minutes Intravenous Every 12 hours 03/09/22 1734 03/17/22 0559  ? 03/09/22 1645  ceFEPIme (MAXIPIME) 2 g in sodium chloride 0.9 % 100 mL IVPB       ? 2 g ?200 mL/hr  over 30 Minutes Intravenous  Once 03/09/22 1635 03/09/22 1721  ? 03/09/22 1630  metroNIDAZOLE (FLAGYL) IVPB 500 mg  Status:  Discontinued       ? 500 mg ?100 mL/hr over 60 Minutes Intravenous Every 12 hours 03/09/22 1621 03/09/22 1743  ? ?  ?Post op pelvic inflammation.  l.   ?  ?Plan: ?Cont NPO and bowel rest for now ?Cont home meds ?Will start TPN due to inability to tolerate PO for the foreseeable future ?Acute dehydration related to poor PO intake and nausea: resolved ?Cont IV antibiotics due to pelvic infection ?Recheck cbc in AM.   ?Recheck CT scan pelvis on Sun  ?  ?Sitz baths, IV dilaudid  ? ? LOS: 3 days  ? ? ?Turner Daniels MD  ?03/13/2022 ? ?

## 2022-03-13 NOTE — Progress Notes (Signed)
Unable to prime TPN past filter. Returned TPN back to pharmacy. Pharmacist Chrystal will order D10W'@60cc'$ /hr to replace TPN. ?

## 2022-03-13 NOTE — Progress Notes (Signed)
PHARMACY - TOTAL PARENTERAL NUTRITION CONSULT NOTE  ? ?Indication: rectal perforation, prolonged NPO ? ?Patient Measurements: ?Height: '5\' 9"'$  (175.3 cm) ?Weight: 77.2 kg (170 lb 4.8 oz) ?IBW/kg (Calculated) : 70.7 ?TPN AdjBW (KG): 77.2 ?Body mass index is 25.15 kg/m?. ? ?Assessment: 71 yo male with history of rectal bleeding and prolapsed internal hemorrhoids who recently underwent hemorrhoid surgery w/ hemorrhoidal dearterialization on 03/05/22.  He presented to [facility] on 4/25 with increasing abdominal pain, nausea, and fever.  CT abdomen/pelvis with thickening of rectal wall; gas and fluid within the rectal wall and in presacral space concerning for rectal perforation.  Pharmacy has been consulted to manage TPN as patient unable to tolerate PO currently and for the foreseeable future.  ? ?Glucose / Insulin: no history of DM, CBGs < 180. No SSI prior to TPN initiation. ?Electrolytes: Na low 130, K 3.1, Bicarb 19 improved, CoCa 9.6, other lytes wnl ?Renal: Scr 2.21>>1.28 improved today, baseline appears to be <1. Inc Scr likely attributed to urinary retention that resolved with catheter placement.   ?Hepatic: Labs from 4/29: AST/ALT normalized,, Tbili 2.7 down, albumin 2, TG 96 ? ?Intake / Output: NPO ?- UOP 5200/24h improved with foley; LBM 4/28 ? ?MIVF: D5 + 1/2 NS and Kcl 20 mEq/L at 30 mL/hr  ? ?GI Imaging: ?- 4/25 CTAP: rectal wall thickening w/ gas tracking into the anterior rectal wall, presacral fluid as well as stranding and gas in the mesorectal fat; consistent w/ rectal perforation ? ?GI Surgeries / Procedures:  ?- 4/21 (prior to admit): hemorrhoid surgery w/ a dearterialization THD procedure ? ?Central access: placed 4/28 ? ?TPN start date: 03/12/22 ? ?Nutritional Goals: ?Goal TPN rate is 80 mL/hr (provides 96 g of protein and 1959 kcals per day) ? ?RD Assessment: d/w RD ?Estimated Needs ?Total Energy Estimated Needs: 1900-2100 ?Total Protein Estimated Needs: 90-100g ?Total Fluid Estimated Needs:  2L/day1900-2100 kcal/day ?90-100g AA ? ?Current Nutrition:  ?NPO except for sips w/ meds ?TPN to start 4/28 PM ? ?Plan:  ?Increase TPN to 25m/hr (goal 829mhr) ?Electrolytes in TPN:  ?Na 508mL ?K incr 73m15m (will provide extra 38me72m next bag) ?Ca 5mEq/75mMg 3mEq/L26mhos 15mmol/29ml:Ac 1:1 ? ?Kruns 10meq x 70mis AM. ?Add standard MVI and trace elements to TPN ?Initiate Sensitive q6h SSI and adjust as needed  ?Reduce MIVF to 30 mL/hr at 1800 or per MD ?Monitor TPN labs on Mon/Thurs and PRN ?CT scan pelvis on Sun  ? ?Bryam Taborda S. RobertsonAlford Highland BCPS ?Clinical Staff Pharmacist ?[email or web address]

## 2022-03-13 NOTE — Progress Notes (Signed)
TPN returned to pharmacy with defective filter and TPN will not pass through. Ordered D10W to run at 66m/hr until tomorrow. ? ?Nigil Braman S. RAlford Highland PharmD, BCPS ?Clinical Staff Pharmacist ?[email or web address] ?

## 2022-03-14 ENCOUNTER — Inpatient Hospital Stay (HOSPITAL_COMMUNITY): Payer: BC Managed Care – PPO

## 2022-03-14 LAB — GLUCOSE, CAPILLARY
Glucose-Capillary: 115 mg/dL — ABNORMAL HIGH (ref 70–99)
Glucose-Capillary: 116 mg/dL — ABNORMAL HIGH (ref 70–99)
Glucose-Capillary: 119 mg/dL — ABNORMAL HIGH (ref 70–99)
Glucose-Capillary: 122 mg/dL — ABNORMAL HIGH (ref 70–99)
Glucose-Capillary: 71 mg/dL (ref 70–99)
Glucose-Capillary: 94 mg/dL (ref 70–99)

## 2022-03-14 LAB — CBC
HCT: 33.3 % — ABNORMAL LOW (ref 39.0–52.0)
Hemoglobin: 11.9 g/dL — ABNORMAL LOW (ref 13.0–17.0)
MCH: 33.9 pg (ref 26.0–34.0)
MCHC: 35.7 g/dL (ref 30.0–36.0)
MCV: 94.9 fL (ref 80.0–100.0)
Platelets: 129 10*3/uL — ABNORMAL LOW (ref 150–400)
RBC: 3.51 MIL/uL — ABNORMAL LOW (ref 4.22–5.81)
RDW: 14 % (ref 11.5–15.5)
WBC: 28.2 10*3/uL — ABNORMAL HIGH (ref 4.0–10.5)
nRBC: 0 % (ref 0.0–0.2)

## 2022-03-14 LAB — BASIC METABOLIC PANEL
Anion gap: 9 (ref 5–15)
BUN: 32 mg/dL — ABNORMAL HIGH (ref 8–23)
CO2: 21 mmol/L — ABNORMAL LOW (ref 22–32)
Calcium: 8.1 mg/dL — ABNORMAL LOW (ref 8.9–10.3)
Chloride: 103 mmol/L (ref 98–111)
Creatinine, Ser: 1.02 mg/dL (ref 0.61–1.24)
GFR, Estimated: >60 mL/min (ref >60)
Glucose, Bld: 95 mg/dL (ref 70–99)
Potassium: 3.3 mmol/L — ABNORMAL LOW (ref 3.5–5.1)
Sodium: 133 mmol/L — ABNORMAL LOW (ref 135–145)

## 2022-03-14 MED ORDER — IOHEXOL 9 MG/ML PO SOLN
500.0000 mL | ORAL | Status: AC
  Administered 2022-03-14: 500 mL via ORAL

## 2022-03-14 MED ORDER — INSULIN ASPART 100 UNIT/ML IJ SOLN
0.0000 [IU] | Freq: Four times a day (QID) | INTRAMUSCULAR | Status: DC
  Administered 2022-03-15 – 2022-03-16 (×3): 1 [IU] via SUBCUTANEOUS

## 2022-03-14 MED ORDER — TRAVASOL 10 % IV SOLN
INTRAVENOUS | Status: AC
  Filled 2022-03-14: qty 720

## 2022-03-14 MED ORDER — POTASSIUM CHLORIDE 10 MEQ/50ML IV SOLN
10.0000 meq | INTRAVENOUS | Status: AC
  Administered 2022-03-14 (×4): 10 meq via INTRAVENOUS
  Filled 2022-03-14 (×4): qty 50

## 2022-03-14 MED ORDER — IOHEXOL 300 MG/ML  SOLN
100.0000 mL | Freq: Once | INTRAMUSCULAR | Status: AC | PRN
  Administered 2022-03-14: 100 mL via INTRAVENOUS

## 2022-03-14 MED ORDER — SODIUM CHLORIDE 0.9 % IV SOLN
2.0000 g | Freq: Three times a day (TID) | INTRAVENOUS | Status: DC
  Administered 2022-03-14 – 2022-03-22 (×25): 2 g via INTRAVENOUS
  Filled 2022-03-14 (×13): qty 12.5

## 2022-03-14 MED ORDER — IOHEXOL 9 MG/ML PO SOLN
ORAL | Status: AC
  Filled 2022-03-14: qty 1000

## 2022-03-14 NOTE — Progress Notes (Signed)
PHARMACY - TOTAL PARENTERAL NUTRITION CONSULT NOTE  ? ?Indication: rectal perforation, prolonged NPO ? ?Patient Measurements: ?Height: '5\' 9"'$  (175.3 cm) ?Weight: 77.2 kg (170 lb 4.8 oz) ?IBW/kg (Calculated) : 70.7 ?TPN AdjBW (KG): 77.2 ?Body mass index is 25.15 kg/m?. ? ?Assessment: 71 yo male with history of rectal bleeding and prolapsed internal hemorrhoids who recently underwent hemorrhoid surgery w/ hemorrhoidal dearterialization on 03/05/22.  He presented to [facility] on 4/25 with increasing abdominal pain, nausea, and fever.  CT abdomen/pelvis with thickening of rectal wall; gas and fluid within the rectal wall and in presacral space concerning for rectal perforation.  Pharmacy has been consulted to manage TPN as patient unable to tolerate PO currently and for the foreseeable future.  ? ?Glucose / Insulin: no history of DM, CBGs < 180 (on D10W for past 12 hours due to defective TPN bag). No SSI prior to TPN initiation. ? ?Electrolytes: Na 133, K 3.3, Bicarb 21 improved, CoCa 9.6, other lytes wnl ?Renal: Scr 2.21>1.28>1.02 improved today, baseline appears to be <1. Inc Scr likely attributed to urinary retention that resolved with catheter placement.   ?Hepatic: Labs from 4/29: AST/ALT normalized,, Tbili 2.7 down, albumin 2, TG 96 ? ?Intake / Output: NPO ?- UOP 4044m/24h improved with foley; LBM 4/28 ? ?MIVF: D5 + 1/2 NS and Kcl 20 mEq/L at 10 mL/hr  ? ?GI Imaging: ?- 4/25 CTAP: rectal wall thickening w/ gas tracking into the anterior rectal wall, presacral fluid as well as stranding and gas in the mesorectal fat; consistent w/ rectal perforation ? ?GI Surgeries / Procedures:  ?- 4/21 (prior to admit): hemorrhoid surgery w/ a dearterialization THD procedure ? ?Central access: placed 4/28 ? ?TPN start date: 03/12/22 ? ?Nutritional Goals: ?Goal TPN rate is 80 mL/hr (provides 96 g of protein and 1959 kcals per day) ? ?RD Assessment: d/w RD ?Estimated Needs ?Total Energy Estimated Needs: 1900-2100 ?Total Protein Estimated  Needs: 90-100g ?Total Fluid Estimated Needs: 2L/day1900-2100 kcal/day ?90-100g AA ? ?Current Nutrition:  ?NPO except for sips w/ meds ?TPN to start 4/28 PM ? ?Plan:  ?TPN at 668mhr (goal 8049mr)--did not receive bag 4/29 (defective bag) ?Electrolytes in TPN:  ?Na 49m82m ?K incr 49mE8m(will provide extra 38meq37mnext bag) to keep K around 2. ?Ca 5mEq/L65mg 3mEq/L 40mos 15mmol/L79m:Ac 1:1 ? ?Repeat Kruns 10meq x 440ms AM. ?Add standard MVI and trace elements to TPN ?Initiate Sensitive q6h SSI and adjust as needed  ?Reduce MIVF to 10 mL/hr at 1800 or per MD ?Monitor TPN labs on Mon/Thurs and PRN ?CT scan pelvis on Sun  ? ?Myeasha Ballowe S. Rilyn Scroggs,Alford HighlandBCPS ?Clinical Staff Pharmacist ?[email or web address] [place]

## 2022-03-14 NOTE — Progress Notes (Signed)
? ?Subjective/Chief Complaint: ?Patient complaining of more pain around his anal canal and over his pubis.  His white count is up to 20,000 today.  No obvious fever or signs of tachycardia.  No nausea or vomiting.  His partner is at his bedside. ? ? ?Objective: ?Vital signs in last 24 hours: ?Temp:  [98 ?F (36.7 ?C)-98.6 ?F (37 ?C)] 98.3 ?F (36.8 ?C) (04/30 0617) ?Pulse Rate:  [75-86] 86 (04/30 0617) ?Resp:  [16-18] 18 (04/30 0617) ?BP: (116-133)/(73-78) 116/73 (04/30 0617) ?SpO2:  [94 %-98 %] 94 % (04/30 0617) ?Last BM Date : 03/13/22 ? ?Intake/Output from previous day: ?04/29 0701 - 04/30 0700 ?In: 2158.3 [I.V.:1558.3; IV Piggyback:600] ?Out: 4025 [[id number]] ?Intake/Output this shift: ?Total I/O ?In: -  ?Out: 400 [Urine:400] ? ?General appearance: alert and cooperative ?Resp: clear to auscultation bilaterally ?Cardio: Normal sinus rhythm ?Incision/Wound: Anal canal was clean.  There is some ecchymoses noted circumferentially which is mild.  There is some mild swelling and fullness.  Perineum has no crepitance.  Scrotum is no crepitance.  Discomfort to palpation over the pubic symphysis.  No abdominal rebound or guarding.  Mild distention noted. ? ?Lab Results:  ?Recent Labs  ?  03/13/22 ?6213 03/14/22 ?0354  ?WBC 25.5* 28.2*  ?HGB 11.6* 11.9*  ?HCT 32.6* 33.3*  ?PLT 91* 129*  ? ?BMET ?Recent Labs  ?  03/13/22 ?0355 03/14/22 ?0354  ?NA 130* 133*  ?K 3.1* 3.3*  ?CL 103 103  ?CO2 19* 21*  ?GLUCOSE 115* 95  ?BUN 52* 32*  ?CREATININE 1.28* 1.02  ?CALCIUM 8.0* 8.1*  ? ?PT/INR ?No results for input(s): LABPROT, INR in the last 72 hours. ?ABG ?No results for input(s): PHART, HCO3 in the last 72 hours. ? ?Invalid input(s): PCO2, PO2 ? ?Studies/Results: ?No results found. ? ?Anti-infectives: ?Anti-infectives (From admission, onward)  ? ? Start     Dose/Rate Route Frequency Ordered Stop  ? 03/14/22 1400  ceFEPIme (MAXIPIME) 2 g in sodium chloride 0.9 % 100 mL IVPB       ? 2 g ?200 mL/hr over 30 Minutes Intravenous Every  8 hours 03/14/22 0845    ? 03/10/22 1400  valACYclovir (VALTREX) tablet 500 mg       ? 500 mg Oral Every morning 03/10/22 1021    ? 03/10/22 0500  ceFEPIme (MAXIPIME) 2 g in sodium chloride 0.9 % 100 mL IVPB  Status:  Discontinued       ? 2 g ?200 mL/hr over 30 Minutes Intravenous Every 12 hours 03/09/22 1750 03/14/22 0845  ? 03/10/22 0400  metroNIDAZOLE (FLAGYL) IVPB 500 mg       ? 500 mg ?100 mL/hr over 60 Minutes Intravenous Every 12 hours 03/09/22 1734 03/17/22 0559  ? 03/09/22 1645  ceFEPIme (MAXIPIME) 2 g in sodium chloride 0.9 % 100 mL IVPB       ? 2 g ?200 mL/hr over 30 Minutes Intravenous  Once 03/09/22 1635 03/09/22 1721  ? 03/09/22 1630  metroNIDAZOLE (FLAGYL) IVPB 500 mg  Status:  Discontinued       ? 500 mg ?100 mL/hr over 60 Minutes Intravenous Every 12 hours 03/09/22 1621 03/09/22 1743  ? ?  ? ?Post op pelvic inflammation.  Status post transanal THD for hemorrhoid disease ? ?Plan: ?Cont NPO and bowel rest for now ?Cont home meds ?Will start TPN due to inability to tolerate PO for the foreseeable future ?Acute dehydration related to poor PO intake and nausea: resolved ?Cont IV antibiotics due to pelvic infection ?Recheck cbc  in AM.   ?CT scan of abdomen pelvis today.  With his elevated white count and worsening of pain, I suspect he has a pelvic abscess.  There are no signs of systemic sepsis and I discussed this with the patient and his partner at the bedside.  Depending on findings, intervention may be required which I discussed with him today as well.  Continue IV antibiotics, nutritional support and pain management. ?  ?Sitz baths, IV dilaudid  ? ? LOS: 4 days  ? ? ?Turner Daniels MD  ?03/14/2022 ? ?

## 2022-03-14 NOTE — Progress Notes (Signed)
Pharmacy Antibiotic Note ? ?Tyler [name] is a 71 y.o. male admitted on 03/09/2022 with  rectal perforation .  Pharmacy has been consulted for Cefepime dosing. ? ? ?ID: rectal perforation, WBC wnl. No micro ?- 03/05/22: transanal hemorrhoidal dearterialization for rectal bleeding/Prolapsed internal hemorrhoids ?- 4/24 CT abd: Rectal wall thickening with gas tracking into the anterior rectal wall, presacral fluid as well as stranding and gas in the mesorectal fat, findings most consistent with rectal perforation. ? ?PTA Valtrex  ? ?4/25 cefepime>> ?4/25 flagyl>> ( 5/2)  ? ?Plan: ?Increase Cefepime to 2g IV q8hr with improved renal function ? ? ? ?Height: '5\' 9"'$  (175.3 cm) ?Weight: 77.2 kg (170 lb 4.8 oz) ?IBW/kg (Calculated) : 70.7 ? ?Temp (24hrs), Avg:98.3 ?F (36.8 ?C), Min:98 ?F (36.7 ?C), Max:98.6 ?F (37 ?C) ? ?Recent Labs  ?Lab 03/09/22 ?1432 03/10/22 ?7867 03/12/22 ?5449 03/12/22 ?0631 03/13/22 ?2010 03/14/22 ?0354  ?WBC 10.1 8.1 20.4*  --  25.5* 28.2*  ?CREATININE 1.27*  --   --  2.21* 1.28* 1.02  ?  ?Estimated Creatinine Clearance: 68.4 mL/min (by C-G formula based on SCr of 1.02 mg/dL).   ? ?Allergies  ?Allergen Reactions  ? Fluconazole Hives, Rash and Other (See Comments)  ?  Shingles activated ?  ? Griseofulvin Anaphylaxis, Swelling, Rash and Other (See Comments)  ?  Throat swelling ? ?  ? Penicillins Rash and Other (See Comments)  ?  High fever, tolerates Cefepime  ? Sulfa Antibiotics Other (See Comments)  ?  Joints ache, swell and caused inflammation ?  ? Mometasone Furo-Formoterol Fum Other (See Comments)  ?  Lack of therapeutic effect ?  ? Oxycodone Nausea Only and Other (See Comments)  ?  Nauseous to the point of almost vomiting  ? Retapamulin Rash  ? ? ?Zach Tietje S. Alford Highland, PharmD, BCPS ?Clinical Staff Pharmacist ?[email or web address] ? ?Alford Highland, [employer] ?03/14/2022 8:47 AM ? ?

## 2022-03-15 ENCOUNTER — Encounter (HOSPITAL_COMMUNITY)
Admission: EM | Disposition: A | Discharge status: Rehabilitation Facility | Payer: Self-pay | Source: Home / Self Care | Attending: General Surgery

## 2022-03-15 ENCOUNTER — Inpatient Hospital Stay (HOSPITAL_COMMUNITY): Payer: BC Managed Care – PPO | Admitting: Anesthesiology

## 2022-03-15 ENCOUNTER — Other Ambulatory Visit: Payer: Self-pay

## 2022-03-15 ENCOUNTER — Encounter (HOSPITAL_COMMUNITY): Payer: Self-pay

## 2022-03-15 HISTORY — PX: HEMORRHOID SURGERY: SHX153

## 2022-03-15 LAB — CBC
HCT: 31.4 % — ABNORMAL LOW (ref 39.0–52.0)
Hemoglobin: 10.8 g/dL — ABNORMAL LOW (ref 13.0–17.0)
MCH: 33.8 pg (ref 26.0–34.0)
MCHC: 34.4 g/dL (ref 30.0–36.0)
MCV: 98.1 fL (ref 80.0–100.0)
Platelets: 162 10*3/uL (ref 150–400)
RBC: 3.2 MIL/uL — ABNORMAL LOW (ref 4.22–5.81)
RDW: 14.2 % (ref 11.5–15.5)
WBC: 29.2 10*3/uL — ABNORMAL HIGH (ref 4.0–10.5)
nRBC: 0 % (ref 0.0–0.2)

## 2022-03-15 LAB — MAGNESIUM: Magnesium: 1.7 mg/dL (ref 1.7–2.4)

## 2022-03-15 LAB — COMPREHENSIVE METABOLIC PANEL
ALT: 28 U/L (ref 0–44)
AST: 25 U/L (ref 15–41)
Albumin: 1.8 g/dL — ABNORMAL LOW (ref 3.5–5.0)
Alkaline Phosphatase: 144 U/L — ABNORMAL HIGH (ref 38–126)
Anion gap: 4 — ABNORMAL LOW (ref 5–15)
BUN: 27 mg/dL — ABNORMAL HIGH (ref 8–23)
CO2: 21 mmol/L — ABNORMAL LOW (ref 22–32)
Calcium: 7.6 mg/dL — ABNORMAL LOW (ref 8.9–10.3)
Chloride: 106 mmol/L (ref 98–111)
Creatinine, Ser: 0.98 mg/dL (ref 0.61–1.24)
GFR, Estimated: >60 mL/min (ref >60)
Glucose, Bld: 104 mg/dL — ABNORMAL HIGH (ref 70–99)
Potassium: 3.7 mmol/L (ref 3.5–5.1)
Sodium: 131 mmol/L — ABNORMAL LOW (ref 135–145)
Total Bilirubin: 2.5 mg/dL — ABNORMAL HIGH (ref 0.3–1.2)
Total Protein: 4.7 g/dL — ABNORMAL LOW (ref 6.5–8.1)

## 2022-03-15 LAB — GLUCOSE, CAPILLARY
Glucose-Capillary: 112 mg/dL — ABNORMAL HIGH (ref 70–99)
Glucose-Capillary: 141 mg/dL — ABNORMAL HIGH (ref 70–99)
Glucose-Capillary: 144 mg/dL — ABNORMAL HIGH (ref 70–99)
Glucose-Capillary: 69 mg/dL — ABNORMAL LOW (ref 70–99)
Glucose-Capillary: 92 mg/dL (ref 70–99)
Glucose-Capillary: 97 mg/dL (ref 70–99)

## 2022-03-15 LAB — SURGICAL PCR SCREEN
MRSA, PCR: NEGATIVE
Staphylococcus aureus: NEGATIVE

## 2022-03-15 LAB — TRIGLYCERIDES: Triglycerides: 107 mg/dL (ref <150)

## 2022-03-15 LAB — PHOSPHORUS: Phosphorus: 3 mg/dL (ref 2.5–4.6)

## 2022-03-15 SURGERY — HEMORRHOIDECTOMY
Anesthesia: General | Site: Rectum

## 2022-03-15 MED ORDER — TRAVASOL 10 % IV SOLN
INTRAVENOUS | Status: AC
  Filled 2022-03-15: qty 960

## 2022-03-15 MED ORDER — MIDAZOLAM HCL 2 MG/2ML IJ SOLN
INTRAMUSCULAR | Status: AC
  Filled 2022-03-15: qty 2

## 2022-03-15 MED ORDER — MAGNESIUM SULFATE 2 GM/50ML IV SOLN
2.0000 g | Freq: Once | INTRAVENOUS | Status: AC
  Administered 2022-03-15: 2 g via INTRAVENOUS
  Filled 2022-03-15: qty 50

## 2022-03-15 MED ORDER — ROCURONIUM BROMIDE 10 MG/ML (PF) SYRINGE
PREFILLED_SYRINGE | INTRAVENOUS | Status: AC
  Filled 2022-03-15: qty 10

## 2022-03-15 MED ORDER — CHLORHEXIDINE GLUCONATE 0.12 % MT SOLN
15.0000 mL | Freq: Once | OROMUCOSAL | Status: AC
  Administered 2022-03-15: 15 mL via OROMUCOSAL

## 2022-03-15 MED ORDER — ACETAMINOPHEN 10 MG/ML IV SOLN
1000.0000 mg | Freq: Once | INTRAVENOUS | Status: AC
  Administered 2022-03-15: 1000 mg via INTRAVENOUS
  Filled 2022-03-15: qty 100

## 2022-03-15 MED ORDER — MIDAZOLAM HCL 5 MG/5ML IJ SOLN
INTRAMUSCULAR | Status: DC | PRN
  Administered 2022-03-15 (×2): 1 mg via INTRAVENOUS

## 2022-03-15 MED ORDER — FENTANYL CITRATE (PF) 100 MCG/2ML IJ SOLN
INTRAMUSCULAR | Status: AC
  Filled 2022-03-15: qty 2

## 2022-03-15 MED ORDER — LIDOCAINE 2% (20 MG/ML) 5 ML SYRINGE
INTRAMUSCULAR | Status: DC | PRN
  Administered 2022-03-15: 100 mg via INTRAVENOUS

## 2022-03-15 MED ORDER — FENTANYL CITRATE PF 50 MCG/ML IJ SOSY: 25.0000 ug | PREFILLED_SYRINGE | INTRAMUSCULAR | Status: DC | PRN

## 2022-03-15 MED ORDER — ALBUMIN HUMAN 5 % IV SOLN
INTRAVENOUS | Status: AC
  Filled 2022-03-15: qty 250

## 2022-03-15 MED ORDER — PROPOFOL 10 MG/ML IV BOLUS
INTRAVENOUS | Status: AC
  Filled 2022-03-15: qty 20

## 2022-03-15 MED ORDER — PHENYLEPHRINE 80 MCG/ML (10ML) SYRINGE FOR IV PUSH (FOR BLOOD PRESSURE SUPPORT)
PREFILLED_SYRINGE | INTRAVENOUS | Status: DC | PRN
Start: 2022-03-15 — End: 2022-03-15
  Administered 2022-03-15 (×3): 80 ug via INTRAVENOUS
  Administered 2022-03-15: 40 ug via INTRAVENOUS
  Administered 2022-03-15: 80 ug via INTRAVENOUS
  Administered 2022-03-15: 40 ug via INTRAVENOUS
  Administered 2022-03-15: 80 ug via INTRAVENOUS

## 2022-03-15 MED ORDER — DEXTROSE 50 % IV SOLN
INTRAVENOUS | Status: AC
  Administered 2022-03-15: 25 mL
  Filled 2022-03-15: qty 50

## 2022-03-15 MED ORDER — ONDANSETRON HCL 4 MG/2ML IJ SOLN
INTRAMUSCULAR | Status: DC | PRN
  Administered 2022-03-15: 4 mg via INTRAVENOUS

## 2022-03-15 MED ORDER — PROPOFOL 10 MG/ML IV BOLUS
INTRAVENOUS | Status: AC
Start: 2022-03-15 — End: ?
  Filled 2022-03-15: qty 20

## 2022-03-15 MED ORDER — BUPIVACAINE-EPINEPHRINE 0.5% -1:200000 IJ SOLN
INTRAMUSCULAR | Status: DC | PRN
  Administered 2022-03-15: 30 mL

## 2022-03-15 MED ORDER — ORAL CARE MOUTH RINSE: 15.0000 mL | Freq: Once | OROMUCOSAL | Status: AC

## 2022-03-15 MED ORDER — ACETAMINOPHEN 500 MG PO TABS
1000.0000 mg | ORAL_TABLET | Freq: Once | ORAL | Status: AC
  Administered 2022-03-15: 1000 mg via ORAL

## 2022-03-15 MED ORDER — LIDOCAINE HCL (PF) 2 % IJ SOLN
INTRAMUSCULAR | Status: AC
  Filled 2022-03-15: qty 5

## 2022-03-15 MED ORDER — PROPOFOL 10 MG/ML IV BOLUS
INTRAVENOUS | Status: DC | PRN
  Administered 2022-03-15: 80 mg via INTRAVENOUS

## 2022-03-15 MED ORDER — BUPIVACAINE-EPINEPHRINE (PF) 0.5% -1:200000 IJ SOLN
INTRAMUSCULAR | Status: AC
Start: 2022-03-15 — End: ?
  Filled 2022-03-15: qty 30

## 2022-03-15 MED ORDER — DEXAMETHASONE SODIUM PHOSPHATE 10 MG/ML IJ SOLN
INTRAMUSCULAR | Status: AC
  Filled 2022-03-15: qty 1

## 2022-03-15 MED ORDER — AMISULPRIDE (ANTIEMETIC) 5 MG/2ML IV SOLN: 10.0000 mg | Freq: Once | INTRAVENOUS | Status: DC | PRN

## 2022-03-15 MED ORDER — ONDANSETRON HCL 4 MG/2ML IJ SOLN
INTRAMUSCULAR | Status: AC
  Filled 2022-03-15: qty 2

## 2022-03-15 MED ORDER — DEXAMETHASONE SODIUM PHOSPHATE 10 MG/ML IJ SOLN
INTRAMUSCULAR | Status: DC | PRN
  Administered 2022-03-15: 4 mg via INTRAVENOUS

## 2022-03-15 MED ORDER — FENTANYL CITRATE (PF) 100 MCG/2ML IJ SOLN
INTRAMUSCULAR | Status: DC | PRN
  Administered 2022-03-15: 50 ug via INTRAVENOUS
  Administered 2022-03-15: 100 ug via INTRAVENOUS
  Administered 2022-03-15: 50 ug via INTRAVENOUS

## 2022-03-15 MED ORDER — ALBUMIN HUMAN 5 % IV SOLN: INTRAVENOUS | Status: DC | PRN

## 2022-03-15 MED ORDER — 0.9 % SODIUM CHLORIDE (POUR BTL) OPTIME
TOPICAL | Status: DC | PRN
  Administered 2022-03-15: 1000 mL

## 2022-03-15 MED ORDER — POTASSIUM CHLORIDE 10 MEQ/100ML IV SOLN
10.0000 meq | INTRAVENOUS | Status: AC
  Administered 2022-03-15 (×2): 10 meq via INTRAVENOUS
  Filled 2022-03-15 (×2): qty 100

## 2022-03-15 MED ORDER — LACTATED RINGERS IV SOLN: INTRAVENOUS | Status: DC

## 2022-03-15 MED ORDER — ALBUMIN HUMAN 5 % IV SOLN
12.5000 g | Freq: Once | INTRAVENOUS | Status: AC
Start: 2022-03-15 — End: 2022-03-15
  Administered 2022-03-15: 12.5 g via INTRAVENOUS

## 2022-03-15 SURGICAL SUPPLY — 32 items
BAG COUNTER SPONGE SURGICOUNT (BAG) IMPLANT
BAG SPNG CNTER NS LX DISP (BAG)
BLADE SURG 15 STRL LF DISP TIS (BLADE) ×1 IMPLANT
BLADE SURG 15 STRL SS (BLADE) ×2
BLADE SURG SZ10 CARB STEEL (BLADE) ×1 IMPLANT
DRAIN PENROSE 0.25X18 (DRAIN) IMPLANT
DRAPE LAPAROTOMY T 98X78 PEDS (DRAPES) ×2 IMPLANT
DRSG PAD ABDOMINAL 8X10 ST (GAUZE/BANDAGES/DRESSINGS) IMPLANT
ELECT REM PT RETURN 15FT ADLT (MISCELLANEOUS) ×2 IMPLANT
GAUZE 4X4 16PLY ~~LOC~~+RFID DBL (SPONGE) ×2 IMPLANT
GAUZE PAD ABD 8X10 STRL (GAUZE/BANDAGES/DRESSINGS) ×1 IMPLANT
GAUZE SPONGE 4X4 12PLY STRL (GAUZE/BANDAGES/DRESSINGS) ×1 IMPLANT
GLOVE BIO SURGEON STRL SZ 6.5 (GLOVE) ×2 IMPLANT
GLOVE BIOGEL PI IND STRL 7.0 (GLOVE) ×1 IMPLANT
GLOVE BIOGEL PI INDICATOR 7.0 (GLOVE) ×1
GLOVE INDICATOR 6.5 STRL GRN (GLOVE) ×2 IMPLANT
GOWN STRL REUS W/ TWL XL LVL3 (GOWN DISPOSABLE) ×2 IMPLANT
GOWN STRL REUS W/TWL XL LVL3 (GOWN DISPOSABLE) ×4
KIT BASIN OR (CUSTOM PROCEDURE TRAY) ×2 IMPLANT
KIT SIGMOIDOSCOPE (SET/KITS/TRAYS/PACK) ×1 IMPLANT
KIT TURNOVER KIT A (KITS) ×2 IMPLANT
PACK LITHOTOMY IV (CUSTOM PROCEDURE TRAY) ×2 IMPLANT
PENCIL SMOKE EVACUATOR (MISCELLANEOUS) ×1 IMPLANT
SET IRRIG Y TYPE TUR BLADDER L (SET/KITS/TRAYS/PACK) ×1 IMPLANT
SURGILUBE 2OZ TUBE FLIPTOP (MISCELLANEOUS) ×2 IMPLANT
SUT SILK 2 0 (SUTURE)
SUT SILK 2-0 18XBRD TIE 12 (SUTURE) IMPLANT
SUT VIC AB 2-0 SH 27 (SUTURE) ×4
SUT VIC AB 2-0 SH 27X BRD (SUTURE) IMPLANT
TOWEL OR 17X26 10 PK STRL BLUE (TOWEL DISPOSABLE) ×2 IMPLANT
TOWEL OR NON WOVEN STRL DISP B (DISPOSABLE) ×2 IMPLANT
WATER STERILE IRR 500ML POUR (IV SOLUTION) ×1 IMPLANT

## 2022-03-15 NOTE — Anesthesia Preprocedure Evaluation (Addendum)
Anesthesia Evaluation  ?Patient identified by MRN, date of birth, ID band ?Patient awake ? ? ? ?Reviewed: ?Allergy & Precautions, NPO status , Patient's Chart, lab work & pertinent test results ? ?History of Anesthesia Complications ?Negative for: history of anesthetic complications ? ?Airway ?Mallampati: II ? ?TM Distance: >3 FB ?Neck ROM: Full ? ? ? Dental ?no notable dental hx. ?(+) Dental Advisory Given ?  ?Pulmonary ?asthma , former smoker,  ?  ?Pulmonary exam normal ? ? ? ? ? ? ? Cardiovascular ?Exercise Tolerance: Good ?hypertension, Pt. on home beta blockers and Pt. on medications ?Normal cardiovascular exam ?Rate:Tachycardia ? ?Sinus rhythm with 1st degree A-V block ?Otherwise normal ECG ?When compared with ECG of 19-May-2018 14:06, ?PREVIOUS ECG IS PRESENT ?  ?Neuro/Psych ?negative neurological ROS ? negative psych ROS  ? GI/Hepatic ?GERD  ,(+)  ?  ? substance abuse ? alcohol use,   ?Endo/Other  ?negative endocrine ROS ? Renal/GU ?negative Renal ROS  ?negative genitourinary ?  ?Musculoskeletal ? ?(+) Arthritis ,  ? Abdominal ?  ?Peds ?negative pediatric ROS ?(+)  Hematology ?negative hematology ROS ?(+)   ?Anesthesia Other Findings ? ? Reproductive/Obstetrics ?negative OB ROS ? ?  ? ? ? ? ? ? ? ? ? ? ? ? ? ?  ?  ? ? ? ? ? ? ? ?Anesthesia Physical ? ?Anesthesia Plan ? ?ASA: 2 and emergent ? ?Anesthesia Plan: General  ? ?Post-op Pain Management: Celebrex PO (pre-op)* and Tylenol PO (pre-op)*  ? ?Induction: Intravenous ? ?PONV Risk Score and Plan: 3 and Treatment may vary due to age or medical condition, Midazolam, Ondansetron and Dexamethasone ? ?Airway Management Planned: LMA and Oral ETT ? ?Additional Equipment: None ? ?Intra-op Plan:  ? ?Post-operative Plan: Extubation in OR ? ?Informed Consent: I have reviewed the patients History and Physical, chart, labs and discussed the procedure including the risks, benefits and alternatives for the proposed anesthesia with the  patient or authorized representative who has indicated his/her understanding and acceptance.  ? ? ? ?Dental advisory given ? ?Plan Discussed with: Anesthesiologist, CRNA and Surgeon ? ?Anesthesia Plan Comments: (Pt expressed concern regarding intubation.  We will attempt with LMA, but may need to intubate if necessary.  Pt. Voiced understanding and agreement. ?)  ? ? ? ? ?Anesthesia Quick Evaluation ? ?

## 2022-03-15 NOTE — Progress Notes (Signed)
PHARMACY - TOTAL PARENTERAL NUTRITION CONSULT NOTE  ? ?Indication: rectal perforation, prolonged NPO ? ?Patient Measurements: ?Height: '5\' 9"'$  (175.3 cm) ?Weight: 77.2 kg (170 lb 4.8 oz) ?IBW/kg (Calculated) : 70.7 ?TPN AdjBW (KG): 77.2 ?Body mass index is 25.15 kg/m?. ? ?Assessment: 71 yo male with history of rectal bleeding and prolapsed internal hemorrhoids who recently underwent hemorrhoid surgery w/ hemorrhoidal dearterialization on 03/05/22.  He presented to [facility] on 4/25 with increasing abdominal pain, nausea, and fever.  CT abdomen/pelvis with thickening of rectal wall; gas and fluid within the rectal wall and in presacral space concerning for rectal perforation.  Pharmacy has been consulted to manage TPN as patient unable to tolerate PO currently and for the foreseeable future.  ? ?Glucose / Insulin: no history of DM, CBGs < 180  ?- 1 unit of insulin given in past 24 hours  ? ?Electrolytes: Magnesium 1.7 Na 131, K 3.7 Bicarb 21 , CoCa 9.4 WNL, other lytes wnl ?Renal: Scr < 1, return to baseline ?Hepatic: Labs from 5/1: AST/ALT normalized,, Tbili 2.5 down, albumin 1.8, TG 96 ? ?Intake / Output: NPO ?- UOP 2675m/24h improved with foley; LBM 4/30 ? ?MIVF: D5 + 1/2 NS and Kcl 20 mEq/L at 10 mL/hr  ? ?GI Imaging: ?- 4/25 CTAP: rectal wall thickening w/ gas tracking into the anterior rectal wall, presacral fluid as well as stranding and gas in the mesorectal fat; consistent w/ rectal perforation ?- 4/30 CT scan: extraluminal air tracking into mesenteric space but no free peritoneal fluid   ? ?GI Surgeries / Procedures:  ?- 4/21 (prior to admit): hemorrhoid surgery w/ a dearterialization THD procedure ? ?Central access: placed 4/28 ? ?TPN start date: 03/12/22 ? ?Nutritional Goals: ?Goal TPN rate is 80 mL/hr (provides 96 g of protein and 1959 kcals per day) ? ?RD Assessment: d/w RD ?Estimated Needs ?Total Energy Estimated Needs: 1900-2100 ?Total Protein Estimated Needs: 90-100g ?Total Fluid Estimated Needs:  2L/day1900-2100 kcal/day ?90-100g AA ? ?Current Nutrition:  ?NPO except for sips w/ meds ?TPN to start 4/28 PM ? ?Now:  ?- Magnesium sulfate 2 gr IV x1  ?- Potassium chloride 10 meq IV x 2  ? ?Plan:  ?Inc TPN to  812mhr, the goal rate  ?Electrolytes in TPN:  ?Na inc to 60 mEq/L ?K incr 5064mL   ?Ca 5mE52m ?Mg inc to 6 mEq/L ?Phos 15mm31m ?Cl:Ac 1:1 ? ?Add standard MVI and trace elements to TPN ?Continue  Sensitive q6h SSI and adjust as needed  ?Reduce MIVF to 10 mL/hr at 1800 or per MD ?Monitor TPN labs on Mon/Thurs and PRN ?BMP, magnesium, phosphorus with AM labs  ? ? ?NikolRoyetta AsalrmD, BCPS ?03/15/2022 10:09 AM ? ? ?

## 2022-03-15 NOTE — Anesthesia Postprocedure Evaluation (Signed)
Anesthesia Post Note ? ?Patient: Tyler [name] ? ?Procedure(s) Performed: ANAL EXAM UNDER ANESTHESIA WITH PROCTOSCOPY  AND DEBRIDEMENT (Rectum) ? ?  ? ?Patient location during evaluation: PACU ?Anesthesia Type: General ?Level of consciousness: sedated ?Pain management: pain level controlled ?Vital Signs Assessment: post-procedure vital signs reviewed and stable ?Respiratory status: spontaneous breathing and respiratory function stable ?Cardiovascular status: stable ?Postop Assessment: no apparent nausea or vomiting ?Anesthetic complications: no ? ? ?No notable events documented. ? ?Last Vitals:  ?Vitals:  ? 03/15/22 1900 03/15/22 1937  ?BP: 124/67 125/69  ?Pulse: 86 93  ?Resp: 18 15  ?Temp: 37.1 ?C 37.1 ?C  ?SpO2: 100% 93%  ?  ?Last Pain:  ?Vitals:  ? 03/15/22 1937  ?TempSrc: Oral  ?PainSc:   ? ? ?  ?  ?  ?  ?  ?  ? ?[name] ? ? ? ? ?

## 2022-03-15 NOTE — Transfer of Care (Signed)
Immediate Anesthesia Transfer of Care Note ? ?Patient: Tyler [name] ? ?Procedure(s) Performed: ANAL EXAM UNDER ANESTHESIA WITH PROCTOSCOPY  AND DEBRIDEMENT (Rectum) ? ?Patient Location: PACU ? ?Anesthesia Type:General ? ?Level of Consciousness: awake, alert  and patient cooperative ? ?Airway & Oxygen Therapy: Patient Spontanous Breathing and Patient connected to face mask oxygen ? ?Post-op Assessment: Report given to RN and Post -op Vital signs reviewed and stable ? ?Post vital signs: Reviewed and stable ? ?Last Vitals:  ?Vitals Value Taken Time  ?BP 112/64 03/15/22 1809  ?Temp 36.7 ?C 03/15/22 1809  ?Pulse 99 03/15/22 1812  ?Resp 21 03/15/22 1812  ?SpO2 98 % 03/15/22 1812  ?Vitals shown include unvalidated device data. ? ?Last Pain:  ?Vitals:  ? 03/15/22 1706  ?TempSrc:   ?PainSc: 8   ?   ? ?Patients Stated Pain Goal: 3 (03/15/22 1706) ? ?Complications: No notable events documented. ?

## 2022-03-15 NOTE — Op Note (Signed)
03/09/2022 - 03/15/2022 ? ?5:56 PM ? ?PATIENT:  Tyler [name]  71 y.o. male ? ?Patient Care Team: ?Cari Caraway, MD as PCP - General (Family Medicine) ? ?PRE-OPERATIVE DIAGNOSIS:  PELVIC INFECTION ? ?POST-OPERATIVE DIAGNOSIS:  anal canal necrosis R gluteal abscess ? ?PROCEDURE:  ANAL EXAM UNDER ANESTHESIA WITH DEBRIDEMENT, INCISION AND DRAINAGE OF ANAL CANAL, PROCTOSCOPY ? ?  Surgeon(s): ?Leighton Ruff, MD ? ?ASSISTANT: none  ? ?ANESTHESIA:   local and general ? ?SPECIMEN:  No Specimen ? ?DISPOSITION OF SPECIMEN:  N/A ? ?COUNTS:  YES ? ?PLAN OF CARE:  Return to the floor for ongoing care ? ?PATIENT DISPOSITION:  PACU - hemodynamically stable. ? ?INDICATION: pelvic sepsis after hemorrhoidal pexy ? ? ?OR FINDINGS: L and R posterior necrosis of anal canal ? ?DESCRIPTION: the patient was identified in the preoperative holding area and taken to the OR where they were laid on the operating room table.  MAC anesthesia was induced without difficulty. The patient was then positioned in prone jackknife position with buttocks gently taped apart.  The patient was then prepped and draped in usual sterile fashion.  SCDs were noted to be in place prior to the initiation of anesthesia. A surgical timeout was performed indicating the correct patient, procedure, positioning and need for preoperative antibiotics.  A rectal block was performed using Marcaine with epinephrine.   ? ?I began with a digital rectal exam.  There was an obvious defect of the left posterior anal canal.  This appeared to track into the ischio rectal space.  I then placed a Hill-Ferguson anoscope into the anal canal and evaluated this completely.  The distal rectum was intact.  There was necrosis noted of the left posterior anal canal mucosa with intact sphincter complex underneath.  There was a small area of necrosis in the right posterior anal canal.  There was a right gluteal abscess.  An incision was made using electrocautery.  The abscess was drained.   The incision was enlarged to allow for continued drainage.  I attempted to reapproximate the perfused edges of mucosa using interrupted 2-0 Vicryl sutures at each site of necrosis.  All necrotic material was then debrided away.  Hemostasis was good.  I introduced the rigid proctoscope and gently into the anal canal.  This was advanced into the distal rectum, which appeared well perfused and normal in caliber.  I evaluated the attempted closure of the posterior anal canal.  This appeared to be intact.  Hemostasis was good at the incision and drainage site.  Sterile dressings were applied.  Patient was awakened from anesthesia and sent to the postanesthesia care unit in stable condition.  All counts were correct per operating room staff. ? ?Rosario Adie, MD ? ?Colorectal and General Surgery ?[facility]   ? ?

## 2022-03-15 NOTE — Progress Notes (Addendum)
?Subjective: ?Pt still with pelvic pain. Controlled with Dilaudid.  Low grade fevers.  Foley in place with good UOP ? ?Objective: ?Vital signs in last 24 hours: ?Temp:  [98.9 ?F (37.2 ?C)-100.9 ?F (38.3 ?C)] 98.9 ?F (37.2 ?C) (05/01 7408) ?Pulse Rate:  [85-87] 87 (05/01 0603) ?Resp:  [16] 16 (05/01 0603) ?BP: (108-129)/(69-76) 129/76 (05/01 0603) ?SpO2:  [94 %-99 %] 99 % (05/01 0603) ? ? ?Intake/Output from previous day: ?04/30 0701 - 05/01 0700 ?In: 2205.4 [P.O.:60; I.V.:1690.3; IV Piggyback:455.1] ?Out: 2600 [Urine:2600] ?Intake/Output this shift: ?No intake/output data recorded. ? ? ?General appearance: alert and cooperative ?GI: soft, more distended ? ?Lab Results:  ?Recent Labs  ?  03/14/22 ?0354 03/15/22 ?0325  ?WBC 28.2* 29.2*  ?HGB 11.9* 10.8*  ?HCT 33.3* 31.4*  ?PLT 129* 162  ? ? ?BMET ?Recent Labs  ?  03/14/22 ?0354 03/15/22 ?0325  ?NA 133* 131*  ?K 3.3* 3.7  ?CL 103 106  ?CO2 21* 21*  ?GLUCOSE 95 104*  ?BUN 32* 27*  ?CREATININE 1.02 0.98  ?CALCIUM 8.1* 7.6*  ? ? ?PT/INR ?No results for input(s): LABPROT, INR in the last 72 hours. ?ABG ?No results for input(s): PHART, HCO3 in the last 72 hours. ? ?Invalid input(s): PCO2, PO2 ? ?MEDS, Scheduled ? acetaminophen  1,000 mg Oral Q6H  ? amLODipine  5 mg Oral q AM  ? bisoprolol  10 mg Oral QHS  ? Chlorhexidine Gluconate Cloth  6 each Topical Daily  ? insulin aspart  0-9 Units Subcutaneous Q6H  ? ketorolac  15 mg Intravenous Q8H  ? lip balm  1 application. Topical BID  ? montelukast  10 mg Oral QHS  ? pantoprazole  40 mg Oral Daily  ? sodium chloride flush  10-40 mL Intracatheter Q12H  ? valACYclovir  500 mg Oral q AM  ? ? ?Studies/Results: ?CT ABDOMEN PELVIS W CONTRAST ? ?Result Date: 03/14/2022 ?CLINICAL DATA:  Acute abdominal pain.  Pelvic pain in male. EXAM: CT ABDOMEN AND PELVIS WITH CONTRAST TECHNIQUE: Multidetector CT imaging of the abdomen and pelvis was performed using the standard protocol following bolus administration of intravenous contrast.  RADIATION DOSE REDUCTION: This exam was performed according to the departmental dose-optimization program which includes automated exposure control, adjustment of the mA and/or kV according to patient size and/or use of iterative reconstruction technique. CONTRAST:  142m OMNIPAQUE IOHEXOL 300 MG/ML  SOLN COMPARISON:  CT 5 days ago 03/09/2022 FINDINGS: Lower chest: Central line tip in the lower SVC. Small bilateral pleural effusions and adjacent atelectasis. Hepatobiliary: Punctate hepatic granuloma. No focal liver lesion. Layering hyperdensity in the gallbladder not seen on prior likely representing vicarious excretion of contrast. No calcified gallstone. Pancreas: No ductal dilatation or inflammation. Spleen: Normal in size without focal abnormality. Adrenals/Urinary Tract: Normal adrenal glands. No hydronephrosis or perinephric edema. Homogeneous renal enhancement with symmetric excretion on delayed phase imaging. No renal calculi. Foley catheter decompresses the urinary bladder. Stomach/Bowel: Significant increase in the extraluminal gas from prior exam. There is patchy free air in the pelvis involving the extraperitoneal spaces, presacral space, left anterior preperitoneal space and tracking adjacent to the sigmoid colon. Soft tissue gas tracks anterior to the urinary bladder. Air tracks into a left inguinal hernia. Patchy soft tissue gas tracks cranially in the retroperitoneum adjacent to the mesenteric vessels to the level of the iliac confluence. There is large amount of extraluminal gas involving the left pericolic gutter and anterior pararenal space. Free air tracks into the upper abdomen where it is seen anterior  to the liver. Source of air likely represents rectal perforation or there is rectal wall thickening and edema. Air in the anterior rectal wall, series 2, image 102. There is generalized increase in stranding and ill-defined fluid associated with this extraluminal air. There is no peripherally  enhancing fluid collection. Small hiatal hernia. Decompressed stomach. No small bowel obstruction or inflammation. High-riding cecum in the right mid abdomen. Appendix is not seen. Administered enteric contrast is seen to the level of the sigmoid colon. There is diverticulosis of the distal descending and sigmoid colon but no focally inflamed diverticulum. Vascular/Lymphatic: Aortic atherosclerosis. No aneurysm. No evidence of mesenteric venous thrombus. The portal vein is patent. No bulky adenopathy. Reproductive: Unremarkable prostate. Other: Patchy increased an extraperitoneal gas as described. Left inguinal hernia contains air and small amount of non organized free fluid. There is edema within the subcutaneous soft tissues. Musculoskeletal: Scoliosis and vacuum phenomenon in the lumbar spine. There are no acute or suspicious osseous abnormalities. IMPRESSION: 1. Significant increase in extraluminal extraperitoneal gas in the abdomen and pelvis from CT 5 days ago as described above. Source of air likely represents rectal perforation. There is generalized increase in stranding and ill-defined fluid associated with this extraluminal air. No peripherally enhancing fluid collection. 2. Left inguinal hernia contains tracking air and small amount of non organized free fluid. 3. Distal colonic diverticulosis without acute inflammation. 4. Small bilateral pleural effusions and adjacent atelectasis. Aortic Atherosclerosis (ICD10-I70.0). These results will be called to the ordering clinician or representative by the Radiologist Assistant, and communication documented in the PACS or [employer]. Electronically Signed   By: Keith Rake M.D.   On: 03/14/2022 15:52   ? ?Assessment: ?s/p  ?Patient Active Problem List  ? Diagnosis Date Noted  ? Acute urinary retention 03/12/2022  ? 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 Mild persistent asthma 03/12/2022  ? Abdominal pain 03/09/2022  ? Pelvic pain in  male 03/09/2022  ? Pruritus ani 04/20/2018  ? Arthritis of both knees 01/13/2018  ? Hx of adenomatous polyp of colon 04/07/2017  ? Perianal dermatitis 01/14/2016  ? 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/12/2013  ? History of tobacco use 11/12/2013  ? Low testosterone 09/19/2013  ? Sinusitis 12/21/2012  ? 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 Eczema 10/30/2012  ? Prolapsed internal hemorrhoids, grade 3 10/30/2012  ? Hypertension 04/13/2011  ? [place], [place] 05/14/2010  ? NEOPLASM, SKIN, UNCERTAIN BEHAVIOR 25/36/6440  ? ALLERGIC RHINITIS 08/16/2008  ? Rash and other nonspecific skin eruption 07/02/2008  ? ABNORMAL COAGULATION PROFILE 05/29/2008  ? Gastroesophageal reflux disease 06/28/2007  ? PITYRIASIS ROSEA 06/28/2007  ? OSTEOARTHROSIS, LOCAL NOS, LOWER LEG 06/28/2007  ? Asthma, chronic 05/11/2007  ? ? ?Post op pelvic inflammation.  CT scan reviewed.   ?Plan: ?Cont NPO and bowel rest for now ?Cont TPN ?Cont home meds ?Acute dehydration related to poor PO intake and nausea: Cont IVF's for now, UOP better, decrease rate ?Cont IV antibiotics ?Plan for EUA and possible debridement later today ?Recheck cbc in AM.  ? ? ?Sitz baths, IV dilaudid for pain control ? ? ? LOS: 5 days  ? ? ? ?Marland KitchenRosario Adie, MD ?[facility], [place] ? ? ? ?03/15/2022 ?8:47 AM ? ? ? ?  ?

## 2022-03-15 NOTE — Anesthesia Procedure Notes (Signed)
Procedure Name: LMA Insertion ?Date/Time: 03/15/2022 5:25 PM ?Performed by: Eben Burow, CRNA ?Pre-anesthesia Checklist: Patient identified, Emergency Drugs available, Suction available, Patient being monitored and Timeout performed ?Patient Re-evaluated:Patient Re-evaluated prior to induction ?Oxygen Delivery Method: Circle system utilized ?Preoxygenation: Pre-oxygenation with 100% oxygen ?Induction Type: IV induction ?Ventilation: Mask ventilation without difficulty ?LMA: LMA inserted ?LMA Size: 4.0 ?Number of attempts: 1 ?Tube secured with: Tape ?Dental Injury: Teeth and Oropharynx as per pre-operative assessment  ? ? ? ? ?

## 2022-03-15 NOTE — Progress Notes (Signed)
Yellow MEWS initiated d/t HR and temp. IV tylenol given, MD made aware. ?

## 2022-03-16 ENCOUNTER — Encounter (HOSPITAL_COMMUNITY): Payer: Self-pay | Admitting: General Surgery

## 2022-03-16 LAB — GLUCOSE, CAPILLARY
Glucose-Capillary: 101 mg/dL — ABNORMAL HIGH (ref 70–99)
Glucose-Capillary: 116 mg/dL — ABNORMAL HIGH (ref 70–99)
Glucose-Capillary: 121 mg/dL — ABNORMAL HIGH (ref 70–99)
Glucose-Capillary: 70 mg/dL (ref 70–99)

## 2022-03-16 LAB — MAGNESIUM: Magnesium: 2 mg/dL (ref 1.7–2.4)

## 2022-03-16 LAB — BASIC METABOLIC PANEL
Anion gap: 5 (ref 5–15)
BUN: 27 mg/dL — ABNORMAL HIGH (ref 8–23)
CO2: 19 mmol/L — ABNORMAL LOW (ref 22–32)
Calcium: 7.6 mg/dL — ABNORMAL LOW (ref 8.9–10.3)
Chloride: 107 mmol/L (ref 98–111)
Creatinine, Ser: 0.8 mg/dL (ref 0.61–1.24)
GFR, Estimated: >60 mL/min (ref >60)
Glucose, Bld: 143 mg/dL — ABNORMAL HIGH (ref 70–99)
Potassium: 4.2 mmol/L (ref 3.5–5.1)
Sodium: 131 mmol/L — ABNORMAL LOW (ref 135–145)

## 2022-03-16 LAB — PHOSPHORUS: Phosphorus: 2.5 mg/dL (ref 2.5–4.6)

## 2022-03-16 MED ORDER — SODIUM CHLORIDE 0.9 % IV SOLN: INTRAVENOUS | Status: DC | PRN

## 2022-03-16 MED ORDER — LOPERAMIDE HCL 2 MG PO CAPS
2.0000 mg | ORAL_CAPSULE | ORAL | Status: DC | PRN
  Administered 2022-03-19 (×2): 2 mg via ORAL
  Filled 2022-03-16 (×2): qty 1

## 2022-03-16 MED ORDER — HYDROMORPHONE HCL 1 MG/ML IJ SOLN
0.5000 mg | INTRAMUSCULAR | Status: DC | PRN
  Administered 2022-03-16 – 2022-03-21 (×44): 1 mg via INTRAVENOUS
  Filled 2022-03-16 (×44): qty 1

## 2022-03-16 MED ORDER — TRAVASOL 10 % IV SOLN
INTRAVENOUS | Status: AC
  Filled 2022-03-16: qty 960

## 2022-03-16 MED ORDER — CHLORHEXIDINE GLUCONATE CLOTH 2 % EX PADS
6.0000 | MEDICATED_PAD | Freq: Every day | CUTANEOUS | Status: DC
  Administered 2022-03-16 – 2022-03-17 (×2): 6 via TOPICAL

## 2022-03-16 NOTE — Progress Notes (Addendum)
1 Day Post-Op  ?Subjective: ?Looks better. Pain better.  Foley in place with good UOP ? ?Objective: ?Vital signs in last 24 hours: ?Temp:  [97.8 ?F (36.6 ?C)-101.5 ?F (38.6 ?C)] 98.4 ?F (36.9 ?C) (05/02 0602) ?Pulse Rate:  [75-109] 78 (05/02 0602) ?Resp:  [15-24] 16 (05/02 0602) ?BP: (110-125)/(60-72) 110/65 (05/02 0602) ?SpO2:  [91 %-100 %] 96 % (05/02 0602) ? ? ?Intake/Output from previous day: ?05/01 0701 - 05/02 0700 ?In: 2594.5 [P.O.:90; I.V.:1631.3; IV Piggyback:873.2] ?Out: 1500 [Urine:1450; Blood:50] ?Intake/Output this shift: ?No intake/output data recorded. ? ? ?General appearance: alert and cooperative ?GI: soft, distended ? ?Lab Results:  ?Recent Labs  ?  03/14/22 ?0354 03/15/22 ?0325  ?WBC 28.2* 29.2*  ?HGB 11.9* 10.8*  ?HCT 33.3* 31.4*  ?PLT 129* 162  ? ? ?BMET ?Recent Labs  ?  03/15/22 ?0325 03/16/22 ?0444  ?NA 131* 131*  ?K 3.7 4.2  ?CL 106 107  ?CO2 21* 19*  ?GLUCOSE 104* 143*  ?BUN 27* 27*  ?CREATININE 0.98 0.80  ?CALCIUM 7.6* 7.6*  ? ? ?PT/INR ?No results for input(s): LABPROT, INR in the last 72 hours. ?ABG ?No results for input(s): PHART, HCO3 in the last 72 hours. ? ?Invalid input(s): PCO2, PO2 ? ?MEDS, Scheduled ? acetaminophen  1,000 mg Oral Q6H  ? amLODipine  5 mg Oral q AM  ? bisoprolol  10 mg Oral QHS  ? Chlorhexidine Gluconate Cloth  6 each Topical Daily  ? insulin aspart  0-9 Units Subcutaneous Q6H  ? ketorolac  15 mg Intravenous Q8H  ? lip balm  1 application. Topical BID  ? montelukast  10 mg Oral QHS  ? pantoprazole  40 mg Oral Daily  ? sodium chloride flush  10-40 mL Intracatheter Q12H  ? valACYclovir  500 mg Oral q AM  ? ? ?Studies/Results: ?CT ABDOMEN PELVIS W CONTRAST ? ?Result Date: 03/14/2022 ?CLINICAL DATA:  Acute abdominal pain.  Pelvic pain in male. EXAM: CT ABDOMEN AND PELVIS WITH CONTRAST TECHNIQUE: Multidetector CT imaging of the abdomen and pelvis was performed using the standard protocol following bolus administration of intravenous contrast. RADIATION DOSE REDUCTION:  This exam was performed according to the departmental dose-optimization program which includes automated exposure control, adjustment of the mA and/or kV according to patient size and/or use of iterative reconstruction technique. CONTRAST:  157m OMNIPAQUE IOHEXOL 300 MG/ML  SOLN COMPARISON:  CT 5 days ago 03/09/2022 FINDINGS: Lower chest: Central line tip in the lower SVC. Small bilateral pleural effusions and adjacent atelectasis. Hepatobiliary: Punctate hepatic granuloma. No focal liver lesion. Layering hyperdensity in the gallbladder not seen on prior likely representing vicarious excretion of contrast. No calcified gallstone. Pancreas: No ductal dilatation or inflammation. Spleen: Normal in size without focal abnormality. Adrenals/Urinary Tract: Normal adrenal glands. No hydronephrosis or perinephric edema. Homogeneous renal enhancement with symmetric excretion on delayed phase imaging. No renal calculi. Foley catheter decompresses the urinary bladder. Stomach/Bowel: Significant increase in the extraluminal gas from prior exam. There is patchy free air in the pelvis involving the extraperitoneal spaces, presacral space, left anterior preperitoneal space and tracking adjacent to the sigmoid colon. Soft tissue gas tracks anterior to the urinary bladder. Air tracks into a left inguinal hernia. Patchy soft tissue gas tracks cranially in the retroperitoneum adjacent to the mesenteric vessels to the level of the iliac confluence. There is large amount of extraluminal gas involving the left pericolic gutter and anterior pararenal space. Free air tracks into the upper abdomen where it is seen anterior to the liver. Source  of air likely represents rectal perforation or there is rectal wall thickening and edema. Air in the anterior rectal wall, series 2, image 102. There is generalized increase in stranding and ill-defined fluid associated with this extraluminal air. There is no peripherally enhancing fluid collection.  Small hiatal hernia. Decompressed stomach. No small bowel obstruction or inflammation. High-riding cecum in the right mid abdomen. Appendix is not seen. Administered enteric contrast is seen to the level of the sigmoid colon. There is diverticulosis of the distal descending and sigmoid colon but no focally inflamed diverticulum. Vascular/Lymphatic: Aortic atherosclerosis. No aneurysm. No evidence of mesenteric venous thrombus. The portal vein is patent. No bulky adenopathy. Reproductive: Unremarkable prostate. Other: Patchy increased an extraperitoneal gas as described. Left inguinal hernia contains air and small amount of non organized free fluid. There is edema within the subcutaneous soft tissues. Musculoskeletal: Scoliosis and vacuum phenomenon in the lumbar spine. There are no acute or suspicious osseous abnormalities. IMPRESSION: 1. Significant increase in extraluminal extraperitoneal gas in the abdomen and pelvis from CT 5 days ago as described above. Source of air likely represents rectal perforation. There is generalized increase in stranding and ill-defined fluid associated with this extraluminal air. No peripherally enhancing fluid collection. 2. Left inguinal hernia contains tracking air and small amount of non organized free fluid. 3. Distal colonic diverticulosis without acute inflammation. 4. Small bilateral pleural effusions and adjacent atelectasis. Aortic Atherosclerosis (ICD10-I70.0). These results will be called to the ordering clinician or representative by the Radiologist Assistant, and communication documented in the PACS or [employer]. Electronically Signed   By: Keith Rake M.D.   On: 03/14/2022 15:52   ? ?Assessment: ?s/p  ?Patient Active Problem List  ? Diagnosis Date Noted  ? Acute urinary retention 03/12/2022  ? 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 Mild persistent asthma 03/12/2022  ? Abdominal pain 03/09/2022  ? Pelvic pain in male 03/09/2022  ? Pruritus  ani 04/20/2018  ? Arthritis of both knees 01/13/2018  ? Hx of adenomatous polyp of colon 04/07/2017  ? Perianal dermatitis 01/14/2016  ? 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/12/2013  ? History of tobacco use 11/12/2013  ? Low testosterone 09/19/2013  ? Sinusitis 12/21/2012  ? 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 Eczema 10/30/2012  ? Prolapsed internal hemorrhoids, grade 3 10/30/2012  ? Hypertension 04/13/2011  ? [place] 05/14/2010  ? NEOPLASM, SKIN, UNCERTAIN BEHAVIOR 38/75/6433  ? ALLERGIC RHINITIS 08/16/2008  ? Rash and other nonspecific skin eruption 07/02/2008  ? ABNORMAL COAGULATION PROFILE 05/29/2008  ? Gastroesophageal reflux disease 06/28/2007  ? PITYRIASIS [place] 06/28/2007  ? OSTEOARTHROSIS, LOCAL NOS, LOWER LEG 06/28/2007  ? Asthma, chronic 05/11/2007  ? ? ?Post op pelvic sepsis.  ? 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 ?Plan: ?Cont NPO and bowel rest for now ?Cont TPN ?Cont home meds ?Acute dehydration related to poor PO intake and nausea: resolved ?Cont IV antibiotics ? ? ?Ambulate in hall ? ?Sitz baths TID, IV dilaudid for pain control ? ? ? LOS: 6 days  ? ? ? ?Marland KitchenRosario Adie, MD ?[facility], [place] ? ? ? ?03/16/2022 ?7:05 AM ? ? ? ?  ?

## 2022-03-16 NOTE — Progress Notes (Signed)
PHARMACY - TOTAL PARENTERAL NUTRITION CONSULT NOTE  ? ?Indication: rectal perforation, prolonged NPO ? ?Patient Measurements: ?Height: '5\' 9"'$  (175.3 cm) ?Weight: 77.2 kg (170 lb 4.8 oz) ?IBW/kg (Calculated) : 70.7 ?TPN AdjBW (KG): 77.2 ?Body mass index is 25.15 kg/m?. ? ?Assessment: 71 yo male with history of rectal bleeding and prolapsed internal hemorrhoids who recently underwent hemorrhoid surgery w/ hemorrhoidal dearterialization on 03/05/22.  He presented to [facility] on 4/25 with increasing abdominal pain, nausea, and fever.  CT abdomen/pelvis with thickening of rectal wall; gas and fluid within the rectal wall and in presacral space concerning for rectal perforation.  Pharmacy has been consulted to manage TPN as patient unable to tolerate PO currently and for the foreseeable future.  ? ?Glucose / Insulin: no history of DM, CBGs < 180  ?- 2 units of insulin given in past 24 hours  ? ?Electrolytes:  Na 131,  Bicarb 19 , Phospohrous is borderline low at 2.5, CorrCa 9.4 WNL, other lytes wnl ?Renal: Scr < 1, BUN elevated at 27, has improved with Scr trending down as well,   ?Hepatic: Labs from 5/1: AST/ALT normalized,, Tbili 2.5 down, albumin 1.8, TG 96 ? ?Intake / Output: NPO ?- UOP 1468m/24h improved with foley; LBM 4/30 ? ?MIVF: none per MD  ? ?GI Imaging: ?- 4/25 CTAP: rectal wall thickening w/ gas tracking into the anterior rectal wall, presacral fluid as well as stranding and gas in the mesorectal fat; consistent w/ rectal perforation ?- 4/30 CT scan: extraluminal air tracking into mesenteric space but no free peritoneal fluid   ? ?GI Surgeries / Procedures:  ?- 4/21 (prior to admit): hemorrhoid surgery w/ a dearterialization THD procedure ?- 5/1 right gluteal abscess with anal canal necrosis  ? ?Central access: placed 4/28 ? ?TPN start date: 03/12/22 ? ?Nutritional Goals: ?Goal TPN rate is 80 mL/hr (provides 96 g of protein and 1959 kcals per day) ? ?RD Assessment: d/w RD ?Estimated Needs ?Total Energy Estimated  Needs: 1900-2100 ?Total Protein Estimated Needs: 90-100g ?Total Fluid Estimated Needs: 2L/day1900-2100 kcal/day ?90-100g AA ? ?Current Nutrition:  ?NPO except for sips w/ meds ?TPN to start 4/28 PM ? ? ?Plan:  ?TPN at   859mhr,   goal rate  ?Electrolytes in TPN:  ?Na inc to 70 mEq/L ?K  5020mL   ?Ca 5mE29m ?Mg inc to 8 mEq/L ?Phos inc to 18 mmol/L ?Cl:Ac 1:1 ? ?Add standard MVI and trace elements to TPN ?Continue  Sensitive q6h SSI and adjust as needed  ?Monitor TPN labs on Mon/Thurs and PRN ?BMP, magnesium, phosphorus with AM labs  ? ? ?NikoRoyetta AsalarmD, BCPS ?03/16/2022 10:05 AM ? ? ?

## 2022-03-17 ENCOUNTER — Inpatient Hospital Stay (HOSPITAL_COMMUNITY): Payer: BC Managed Care – PPO

## 2022-03-17 DIAGNOSIS — R6520 Severe sepsis without septic shock: Secondary | ICD-10-CM | POA: Diagnosis not present

## 2022-03-17 DIAGNOSIS — A419 Sepsis, unspecified organism: Secondary | ICD-10-CM | POA: Diagnosis not present

## 2022-03-17 LAB — CBC
HCT: 27.9 % — ABNORMAL LOW (ref 39.0–52.0)
HCT: 25.8 % — ABNORMAL LOW (ref 39.0–52.0)
Hemoglobin: 9.5 g/dL — ABNORMAL LOW (ref 13.0–17.0)
Hemoglobin: 8.4 g/dL — ABNORMAL LOW (ref 13.0–17.0)
MCH: 34.9 pg — ABNORMAL HIGH (ref 26.0–34.0)
MCH: 34 pg (ref 26.0–34.0)
MCHC: 34.1 g/dL (ref 30.0–36.0)
MCHC: 32.6 g/dL (ref 30.0–36.0)
MCV: 102.6 fL — ABNORMAL HIGH (ref 80.0–100.0)
MCV: 104.5 fL — ABNORMAL HIGH (ref 80.0–100.0)
Platelets: 290 10*3/uL (ref 150–400)
Platelets: 346 10*3/uL (ref 150–400)
RBC: 2.72 MIL/uL — ABNORMAL LOW (ref 4.22–5.81)
RBC: 2.47 MIL/uL — ABNORMAL LOW (ref 4.22–5.81)
RDW: 15.2 % (ref 11.5–15.5)
RDW: 15.2 % (ref 11.5–15.5)
WBC: 33.3 10*3/uL — ABNORMAL HIGH (ref 4.0–10.5)
WBC: 28.8 10*3/uL — ABNORMAL HIGH (ref 4.0–10.5)
nRBC: 0 % (ref 0.0–0.2)
nRBC: 0 % (ref 0.0–0.2)

## 2022-03-17 LAB — COMPREHENSIVE METABOLIC PANEL
ALT: 29 U/L (ref 0–44)
AST: 20 U/L (ref 15–41)
Albumin: 1.7 g/dL — ABNORMAL LOW (ref 3.5–5.0)
Alkaline Phosphatase: 135 U/L — ABNORMAL HIGH (ref 38–126)
Anion gap: 6 (ref 5–15)
BUN: 28 mg/dL — ABNORMAL HIGH (ref 8–23)
CO2: 14 mmol/L — ABNORMAL LOW (ref 22–32)
Calcium: 7.2 mg/dL — ABNORMAL LOW (ref 8.9–10.3)
Chloride: 112 mmol/L — ABNORMAL HIGH (ref 98–111)
Creatinine, Ser: 0.92 mg/dL (ref 0.61–1.24)
GFR, Estimated: >60 mL/min (ref >60)
Glucose, Bld: 122 mg/dL — ABNORMAL HIGH (ref 70–99)
Potassium: 4.6 mmol/L (ref 3.5–5.1)
Sodium: 132 mmol/L — ABNORMAL LOW (ref 135–145)
Total Bilirubin: 1.5 mg/dL — ABNORMAL HIGH (ref 0.3–1.2)
Total Protein: 5 g/dL — ABNORMAL LOW (ref 6.5–8.1)

## 2022-03-17 LAB — GLUCOSE, CAPILLARY
Glucose-Capillary: 105 mg/dL — ABNORMAL HIGH (ref 70–99)
Glucose-Capillary: 143 mg/dL — ABNORMAL HIGH (ref 70–99)
Glucose-Capillary: 50 mg/dL — ABNORMAL LOW (ref 70–99)
Glucose-Capillary: 73 mg/dL (ref 70–99)
Glucose-Capillary: 91 mg/dL (ref 70–99)

## 2022-03-17 LAB — BASIC METABOLIC PANEL
Anion gap: 5 (ref 5–15)
BUN: 30 mg/dL — ABNORMAL HIGH (ref 8–23)
CO2: 18 mmol/L — ABNORMAL LOW (ref 22–32)
Calcium: 7.7 mg/dL — ABNORMAL LOW (ref 8.9–10.3)
Chloride: 111 mmol/L (ref 98–111)
Creatinine, Ser: 0.95 mg/dL (ref 0.61–1.24)
GFR, Estimated: >60 mL/min (ref >60)
Glucose, Bld: 96 mg/dL (ref 70–99)
Potassium: 4.5 mmol/L (ref 3.5–5.1)
Sodium: 134 mmol/L — ABNORMAL LOW (ref 135–145)

## 2022-03-17 LAB — LACTIC ACID, PLASMA
Lactic Acid, Venous: 1.5 mmol/L (ref 0.5–1.9)
Lactic Acid, Venous: 1.7 mmol/L (ref 0.5–1.9)

## 2022-03-17 LAB — PHOSPHORUS: Phosphorus: 2.6 mg/dL (ref 2.5–4.6)

## 2022-03-17 LAB — MAGNESIUM: Magnesium: 2 mg/dL (ref 1.7–2.4)

## 2022-03-17 LAB — MRSA NEXT GEN BY PCR, NASAL: MRSA by PCR Next Gen: NOT DETECTED

## 2022-03-17 LAB — TROPONIN I (HIGH SENSITIVITY): Troponin I (High Sensitivity): 12 ng/L (ref <18)

## 2022-03-17 LAB — PROCALCITONIN: Procalcitonin: 2.09 ng/mL

## 2022-03-17 MED ORDER — VANCOMYCIN HCL IN DEXTROSE 1-5 GM/200ML-% IV SOLN
1000.0000 mg | Freq: Two times a day (BID) | INTRAVENOUS | Status: DC
  Administered 2022-03-18 – 2022-03-21 (×9): 1000 mg via INTRAVENOUS
  Filled 2022-03-17 (×8): qty 200

## 2022-03-17 MED ORDER — CHLORHEXIDINE GLUCONATE CLOTH 2 % EX PADS
6.0000 | MEDICATED_PAD | Freq: Every day | CUTANEOUS | Status: DC
  Administered 2022-03-18 – 2022-03-26 (×9): 6 via TOPICAL

## 2022-03-17 MED ORDER — ORAL CARE MOUTH RINSE
15.0000 mL | Freq: Two times a day (BID) | OROMUCOSAL | Status: DC
  Administered 2022-03-17 – 2022-03-26 (×18): 15 mL via OROMUCOSAL

## 2022-03-17 MED ORDER — DEXTROSE 50 % IV SOLN
INTRAVENOUS | Status: AC
  Administered 2022-03-17: 50 mL via INTRAVENOUS
  Filled 2022-03-17: qty 50

## 2022-03-17 MED ORDER — INSULIN ASPART 100 UNIT/ML IJ SOLN: 0.0000 [IU] | Freq: Three times a day (TID) | INTRAMUSCULAR | Status: DC

## 2022-03-17 MED ORDER — VANCOMYCIN HCL 1500 MG/300ML IV SOLN
1500.0000 mg | Freq: Once | INTRAVENOUS | Status: AC
  Administered 2022-03-17: 1500 mg via INTRAVENOUS
  Filled 2022-03-17: qty 300

## 2022-03-17 MED ORDER — SODIUM CHLORIDE 0.9 % IV SOLN: Freq: Once | INTRAVENOUS | Status: AC

## 2022-03-17 MED ORDER — INSULIN ASPART 100 UNIT/ML IJ SOLN
0.0000 [IU] | Freq: Four times a day (QID) | INTRAMUSCULAR | Status: DC
  Administered 2022-03-18 – 2022-03-19 (×2): 1 [IU] via SUBCUTANEOUS
  Administered 2022-03-20: 2 [IU] via SUBCUTANEOUS

## 2022-03-17 MED ORDER — LORAZEPAM 2 MG/ML IJ SOLN
0.5000 mg | Freq: Three times a day (TID) | INTRAMUSCULAR | Status: DC | PRN
  Administered 2022-03-19 – 2022-03-22 (×5): 1 mg via INTRAVENOUS
  Filled 2022-03-17 (×6): qty 1

## 2022-03-17 MED ORDER — DEXTROSE 50 % IV SOLN: 1.0000 | Freq: Once | INTRAVENOUS | Status: AC

## 2022-03-17 MED ORDER — LACTATED RINGERS IV BOLUS
1000.0000 mL | Freq: Once | INTRAVENOUS | Status: AC
  Administered 2022-03-17: 1000 mL via INTRAVENOUS

## 2022-03-17 MED ORDER — LORAZEPAM BOLUS VIA INFUSION: 0.5000 mg | Freq: Three times a day (TID) | INTRAVENOUS | Status: DC | PRN

## 2022-03-17 MED ORDER — METRONIDAZOLE 500 MG/100ML IV SOLN
500.0000 mg | Freq: Two times a day (BID) | INTRAVENOUS | Status: DC
  Administered 2022-03-17 – 2022-03-22 (×11): 500 mg via INTRAVENOUS
  Filled 2022-03-17 (×13): qty 100

## 2022-03-17 MED ORDER — SODIUM CHLORIDE 0.9 % IV SOLN
INTRAVENOUS | Status: DC | PRN
Start: 2022-03-17 — End: 2022-03-18

## 2022-03-17 MED ORDER — TRAVASOL 10 % IV SOLN
INTRAVENOUS | Status: AC
  Filled 2022-03-17: qty 960

## 2022-03-17 NOTE — Progress Notes (Signed)
Mild to Moderate Bleeding to the incision site on the right buttocks is noted. Pressure dressing and ice pack applied. Pt's vitals within normal limits. Pt denies short of breath or dizziness. Will monitor pt.  ?

## 2022-03-17 NOTE — Progress Notes (Signed)
MD, CN and rapid response Pamala Hurry, RN) notified of changes in pt status, orders received and will cont to monitor. ?

## 2022-03-17 NOTE — Progress Notes (Signed)
Pharmacy Antibiotic Note ? ?Tyler [name] is a 71 y.o. male admitted on 03/09/2022 with  rectal perforation .  Pharmacy has been consulted for Cefepime dosing. ? ? ?ID: rectal perforation, WBC wnl. No micro ?- 03/05/22: transanal hemorrhoidal dearterialization for rectal bleeding/Prolapsed internal hemorrhoids ?- 4/24 CT abd: Rectal wall thickening with gas tracking into the anterior rectal wall, presacral fluid as well as stranding and gas in the mesorectal fat, findings most consistent with rectal perforation. ? ?PTA Valtrex  ? ?4/25 cefepime>> ?4/25 flagyl>>  ? ?Plan: ?Continue  Cefepime to 2g IV q8hr with improved renal function ?Metronidazole 500 mg IV q12h resumed by MD  ? ? ? ?Height: '5\' 9"'$  (175.3 cm) ?Weight: 77.2 kg (170 lb 4.8 oz) ?IBW/kg (Calculated) : 70.7 ? ?Temp (24hrs), Avg:99.1 ?F (37.3 ?C), Min:98.2 ?F (36.8 ?C), Max:100.4 ?F (38 ?C) ? ?Recent Labs  ?Lab 03/12/22 ?0981 03/12/22 ?0631 03/13/22 ?1914 03/14/22 ?0354 03/15/22 ?7829 03/16/22 ?5621 03/17/22 ?3086  ?WBC 20.4*  --  25.5* 28.2* 29.2*  --  33.3*  ?CREATININE  --    < > 1.28* 1.02 0.98 0.80 0.95  ? < > = values in this interval not displayed.  ? ?  ?Estimated Creatinine Clearance: 73.4 mL/min (by C-G formula based on SCr of 0.95 mg/dL).   ? ?Allergies  ?Allergen Reactions  ? Fluconazole Hives, Rash and Other (See Comments)  ?  Shingles activated ?  ? Griseofulvin Anaphylaxis, Swelling, Rash and Other (See Comments)  ?  Throat swelling ? ?  ? Penicillins Rash and Other (See Comments)  ?  High fever, tolerates Cefepime  ? Sulfa Antibiotics Other (See Comments)  ?  Joints ache, swell and caused inflammation ?  ? Mometasone Furo-Formoterol Fum Other (See Comments)  ?  Lack of therapeutic effect ?  ? Oxycodone Nausea Only and Other (See Comments)  ?  Nauseous to the point of almost vomiting  ? Retapamulin Rash  ? ? ? ?Dosage will likely remain stable at above dosage  and need for further dosage adjustment appears unlikely at present.   ? ?Will  sign off at this time and will follow peripherally Please reconsult if a change in clinical status warrants re-evaluation of dosage. ?  ? ?Royetta Asal, PharmD, BCPS ?03/17/2022 10:23 AM ? ? ?

## 2022-03-17 NOTE — Progress Notes (Signed)
Pt with fever and tachycardia.  Transferred to SDU for ongoing care.  Critical care consulted.   ?Pt with decreased UOP s/p bolus.  Will increase IVF's rate.   ?Agree with blood cultures ordered, broadening abx if needed.   ?Pt's abd exam unchanged.  Still with distention and ileus. ?Cont TPN ?Spouse and patient both updated. ? ?Rosario Adie, MD ? ?Colorectal and General Surgery ?[facility]    ?

## 2022-03-17 NOTE — Progress Notes (Addendum)
PHARMACY - TOTAL PARENTERAL NUTRITION CONSULT NOTE  ? ?Indication: rectal perforation, prolonged NPO ? ?Patient Measurements: ?Height: '5\' 9"'$  (175.3 cm) ?Weight: 77.2 kg (170 lb 4.8 oz) ?IBW/kg (Calculated) : 70.7 ?TPN AdjBW (KG): 77.2 ?Body mass index is 25.15 kg/m?. ? ?Assessment: 71 yo male with history of rectal bleeding and prolapsed internal hemorrhoids who recently underwent hemorrhoid surgery w/ hemorrhoidal dearterialization on 03/05/22.  He presented to [facility] on 4/25 with increasing abdominal pain, nausea, and fever.  CT abdomen/pelvis with thickening of rectal wall; gas and fluid within the rectal wall and in presacral space concerning for rectal perforation.  Pharmacy has been consulted to manage TPN as patient unable to tolerate PO currently and for the foreseeable future.  ? ?Glucose / Insulin: no history of DM, CBGs < 180  ?- 1 units of insulin given in past 24 hours  ? ?Electrolytes:  Na 134, trending up ,  Bicarb 18 , Phosphorus is borderline low at 2.6, CorrCa 9.4 WNL, other lytes wnl ?Renal: Scr < 1, BUN 30 , has improved with Scr trending down as well,   ?Hepatic: Labs from 5/1: AST/ALT normalized,, Tbili 2.5 down, albumin 1.8, TG 96 ? ?Intake / Output: NPO ?- UOP 1467m/24h improved with foley; LBM 4/30 ? ?MIVF: none per MD  ? ?GI Imaging: ?- 4/25 CTAP: rectal wall thickening w/ gas tracking into the anterior rectal wall, presacral fluid as well as stranding and gas in the mesorectal fat; consistent w/ rectal perforation ?- 4/30 CT scan: extraluminal air tracking into mesenteric space but no free peritoneal fluid   ? ?GI Surgeries / Procedures:  ?- 4/21 (prior to admit): hemorrhoid surgery w/ a dearterialization THD procedure ?- 5/1 right gluteal abscess with anal canal necrosis  ? ?Central access: placed 4/28 ? ?TPN start date: 03/12/22 ? ?Nutritional Goals: ?Goal TPN rate is 80 mL/hr (provides 96 g of protein and 1959 kcals per day) ? ?RD Assessment: d/w RD ?Estimated Needs ?Total Energy Estimated  Needs: 1900-2100 ?Total Protein Estimated Needs: 90-100g ?Total Fluid Estimated Needs: 2L/day1900-2100 kcal/day ?90-100g AA ? ?Current Nutrition:  ?NPO except for sips w/ meds ?TPN to start 4/28 PM ? ? ?Plan:  ?TPN at   826mhr,   goal rate  ?Electrolytes in TPN:  ?Na 70 mEq/L ?K  5071mL   ?Ca 5mE56m ?Mg 8 mEq/L ?Phos 18 mmol/L ?Cl:Ac 1:1 ? ?Add standard MVI and trace elements to TPN ?Adjust   Sensitive  SSI to q8h and adjust as needed  ?Monitor TPN labs on Mon/Thurs and PRN ? ? ?NikoRoyetta AsalarmD, BCPS ?03/17/2022 9:59 AM ? ?ADDENDUM:  ?- Pt with hypoglycemic event of 50 at 1158 today. Will change CBG checks back to q6h for closet monitoring.  ? ? ?NikoRoyetta AsalarmD, BCPS ?03/17/2022 12:39 PM ? ? ? ? ?

## 2022-03-17 NOTE — Consult Note (Addendum)
? ?NAME:  Tyler [name], MRN:  [id number], DOB:  1951/04/22, LOS: 7 ?ADMISSION DATE:  03/09/2022, CONSULTATION DATE:  03/17/22 ?REFERRING MD:  Marcello Moores, CHIEF COMPLAINT:  sepsis  ? ?History of Present Illness:  ?Tyler [name] is a 71 y.o. M with PMH of Asthma, GERD and HTN who underwent a routine transanal hemorrhoidal dearterialization on 4/21.   He experienced worsening abdominal pain, nausea and fever, so presented to the ED on 4/25.   CT scan of the abdomen and pelvis was performed.  This showed thickening of the rectal wall.  He was admitted by surgery and treated conservatively with Cefepime and Flagyl, however continued to have pain and fever, and repeat CT with possible perforation, so was taken to the OR for I&D of the anal canal on 5/1 there was necrosis of the L posterior anal canal which was debrided.   He initially felt better, but over the last 48 hours has continued to be febrile with WBC increasing to 33k.   He reports continued lower abdominal pain and nausea.  BP has been stable, though HR increased to 120-130.  He has been passing gas and blood per rectum and making urine.  Pt was transferred to stepdown and PCCM consulted ? ?Pertinent  Medical History  ? 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 ? ? ?Significant Hospital Events: ?Including procedures, antibiotic start and stop dates in addition to other pertinent events   ?4/21 hemorrhoidal dearterialization ?4/25 admitted to surgery with fever and  pain, thickening of rectal wall  ?5/1 Taken to OR for anal canal I&D and rectal wall necrosis debridement ?5/3 signs of worsening sepsis, transfer to progressive and PCCM consult ? ?CT abd/pelvis 4/25 ?Rectal wall thickening with gas tracking into the anterior rectal ?wall, presacral fluid as well as stranding and gas in the mesorectal ?fat, findings most consistent with rectal perforation. ? ?CT abd/pelvis 4/30 ?1. Significant increase in extraluminal extraperitoneal gas in the ?abdomen and pelvis from CT 5 days ago as described above. Source of ?air likely represents rectal perforation. There is generalized ?increase in stranding and ill-defined fluid associated with this ?extraluminal air. No peripherally enhancing fluid collection. ?2. Left inguinal hernia contains tracking air and small amount of ?non organized free fluid. ? ? ? ?Interim History / Subjective:  ?Pt tachycardic and diaphoretic, complains of lower abdominal pain and nausea ?BP remains stable  ? ?Objective   ?Blood pressure 131/70, pulse (!) 123, temperature (!) 100.9 ?F (38.3 ?C), temperature source Oral, resp. rate 18, height '5\' 9"'$  (1.753 m), weight 77.2 kg, SpO2 99 %. ?   ?   ? ?Intake/Output Summary (Last 24 hours) at 03/17/2022 1533 ?Last data filed at 03/17/2022 1400 ?Gross per 24 hour  ?Intake 2707.69 ml  ?Output 1700 ml  ?Net 1007.69 ml  ? ?Filed Weights  ? 03/09/22 1410  ?Weight: 77.2 kg  ? ? ?General:  well-nourished M, acutely ill-appearing  ?HEENT: MM pink/moist, sclera anicteric ?Neuro: awake,alert, oriented and in no distress ?CV: s1s2 rrr, no m/r/g ?PULM:  clear bilaterally without rhonchi or wheezing and in no distress ?GI: soft, bsx4 active, very TTP in the  lower quadrants, no upper abdominal pain, no rebound, posterior wound site is dressed, continued purulent drainage ?Extremities: warm/dry, no edema,  ?Skin: no rashes or lesions ? ? ?Resolved Hospital Problem list   ? ? ?Assessment & Plan:  ? ? ?Sepsis from anal abscess  ?Necrotic  anal wall  ?NAGMA ?S/p I&D and debridement on 5/1  ?Increasing WBC and HR, continued fever with concern for developing septic shock ?-check blood cultures x2 ?-lactic acid ?-repeat CBC and CMP ?-though MRSA screen negative, consider enterococcus, add Vancomycin ?-appears volume depleted, received 1L bolus, give additional L or LR ?-check KUB for worsening free air  ?-at high risk for decompensation, monitor closely ? ? ? ?Best Practice (right click and "Reselect all SmartList Selections" daily)  ? ?Diet/type: TPN ?DVT prophylaxis: SCD ?GI prophylaxis: N/A ?Lines: yes and it is still needed ?Foley:  Yes, and it is still needed ?Code Status:  full code ?Last date of multidisciplinary goals of care discussion [per primary ] ? ?Labs   ?CBC: ?Recent Labs  ?Lab 03/12/22 ?0416 03/13/22 ?9476 03/14/22 ?0354 03/15/22 ?5465 03/17/22 ?0354  ?WBC 20.4* 25.5* 28.2* 29.2* 33.3*  ?NEUTROABS 18.1*  --   --   --   --   ?HGB 11.3* 11.6* 11.9* 10.8* 9.5*  ?HCT 32.4* 32.6* 33.3* 31.4* 27.9*  ?MCV 99.4 95.9 94.9 98.1 102.6*  ?PLT 74* 91* 129* 162 290  ? ? ?Basic Metabolic Panel: ?Recent Labs  ?Lab 03/12/22 ?0631 03/13/22 ?6568 03/14/22 ?0354 03/15/22 ?1275 03/16/22 ?1700 03/17/22 ?1749  ?NA 126* 130* 133* 131* 131* 134*  ?K 4.5 3.1* 3.3* 3.7 4.2 4.5  ?CL 99 103 103 106 107 111  ?CO2 15* 19* 21* 21* 19* 18*  ?GLUCOSE 93 115* 95 104* 143* 96  ?BUN 65* 52* 32* 27* 27* 30*  ?CREATININE 2.21* 1.28* 1.02 0.98 0.80 0.95  ?CALCIUM 8.1* 8.0* 8.1* 7.6* 7.6* 7.7*  ?MG 2.4 1.9  --  1.7 2.0 2.0  ?PHOS 3.1 2.8  --  3.0 2.5 2.6  ? ?GFR: ?Estimated Creatinine Clearance: 73.4 mL/min (by C-G formula based on SCr of 0.95 mg/dL). ?Recent Labs  ?Lab 03/13/22 ?4496 03/14/22 ?0354 03/15/22 ?7591 03/17/22 ?6384  ?WBC 25.5* 28.2* 29.2* 33.3*  ? ? ?Liver Function Tests: ?Recent Labs  ?Lab 03/13/22 ?6659 03/15/22 ?0325  ?AST 26 25  ?ALT 28 28  ?ALKPHOS 132* 144*  ?BILITOT 2.7* 2.5*  ?PROT 4.9* 4.7*  ?ALBUMIN 2.0* 1.8*  ? ?No results for input(s): LIPASE, AMYLASE  in the last 168 hours. ?No results for input(s): AMMONIA in the last 168 hours. ? ?ABG ?   ?Component Value Date/Time  ? TCO2 26 03/05/2022 1110  ?  ? ?Coagulation Profile: ?No results for input(s): INR, PROTIME in the last 168 hours. ? ?Cardiac Enzymes: ?No results for input(s): CKTOTAL, CKMB, CKMBINDEX, TROPONINI in the last 168 hours. ? ?HbA1C: ?No results found for: HGBA1C ? ?CBG: ?Recent Labs  ?Lab 03/16/22 ?1754 03/16/22 ?2346 03/17/22 ?9357 03/17/22 ?1158 03/17/22 ?1242  ?GLUCAP 70 101* 91 50* 143*  ? ? ?Review of Systems:   ?Please see the history of present illness. All other systems reviewed and are negative  ? ? 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  ? ?Surgical History:  ? ?Past Surgical History:  ?Procedure Laterality Date  ? COLONOSCOPY WITH PROPOFOL  04/01/2017  ? by dr Carlean Purl  ? DIRECT LARYNGOSCOPY  1986  ? left vocal fold bx   (non-cancerous granuloma)  ? FINGER SURGERY Left 2017  ? ligament and tendon repair left index finger  ? HEMORRHOID SURGERY N/A 03/15/2022  ? Procedure: ANAL EXAM UNDER ANESTHESIA WITH PROCTOSCOPY  AND DEBRIDEMENT;  Surgeon: Leighton Ruff, MD;  Location: [facility];  Service: General;  Laterality: N/A;  ? INGUINAL HERNIA REPAIR Right 05/25/2018  ? Procedure: RIGHT INGUINAL HERNIA REPAIR WITH MESH;  Surgeon: Jovita Kussmaul, MD;  Location: [place];  Service: General;  Laterality: Right;  ? INSERTION OF MESH Right 05/25/2018  ? Procedure:  INSERTION OF MESH;  Surgeon: Jovita Kussmaul, MD;  Location: [place];  Service: General;  Laterality: Right;  ? KNEE ARTHROSCOPY W/ MENISCAL REPAIR Right 10/26/2005  ? '@[facility]'$   by Dr  Alu

## 2022-03-17 NOTE — Progress Notes (Addendum)
Pharmacy Antibiotic Note ? ?Tyler [name] is a 71 y.o. male admitted on 03/09/2022 with rectal performation.  Pharmacy has been consulted for Cefepime dosing earlier today (med has been ordered and consult was signed off).  This afternoon patient now febrile and tachycardic.  Pharmacy consulted for Vancomycin dosing for sepsis. ? ?Plan: ?Vancomycin '1500mg'$  IV x 1 followed by Vancomycin 1000 mg IV Q 12 hrs. Goal AUC 400-550.  Expected AUC: 550.9  SCr used: 0.95 ?Cefepime 2gm IV q8h ?Metronidazole per MD ?Valtrex per MD ? ?Height: '5\' 9"'$  (175.3 cm) ?Weight: 77.2 kg (170 lb 4.8 oz) ?IBW/kg (Calculated) : 70.7 ? ?Temp (24hrs), Avg:99.9 ?F (37.7 ?C), Min:98.8 ?F (37.1 ?C), Max:101.4 ?F (38.6 ?C) ? ?Recent Labs  ?Lab 03/12/22 ?6659 03/12/22 ?0631 03/13/22 ?9357 03/14/22 ?0354 03/15/22 ?0177 03/16/22 ?9390 03/17/22 ?3009  ?WBC 20.4*  --  25.5* 28.2* 29.2*  --  33.3*  ?CREATININE  --    < > 1.28* 1.02 0.98 0.80 0.95  ? < > = values in this interval not displayed.  ?  ?Estimated Creatinine Clearance: 73.4 mL/min (by C-G formula based on SCr of 0.95 mg/dL).   ? ?Allergies  ?Allergen Reactions  ? Fluconazole Hives, Rash and Other (See Comments)  ?  Shingles activated ?  ? Griseofulvin Anaphylaxis, Swelling, Rash and Other (See Comments)  ?  Throat swelling ? ?  ? Penicillins Rash and Other (See Comments)  ?  High fever, tolerates Cefepime  ? Sulfa Antibiotics Other (See Comments)  ?  Joints ache, swell and caused inflammation ?  ? Mometasone Furo-Formoterol Fum Other (See Comments)  ?  Lack of therapeutic effect ?  ? Oxycodone Nausea Only and Other (See Comments)  ?  Nauseous to the point of almost vomiting  ? Retapamulin Rash  ? ? ?Antimicrobials this admission: ?4/26 Valtrex >>    ?4/30 Cefepime >>   ?4/26 Metronidazole >> ?5/3 Vancomycin >> ? ?Dose adjustments this admission: ?  ? ?Microbiology results: ?5/3 MRSA PCR:   ? ?Thank you for allowing pharmacy to be a part of this patient?s care. ? ?Everette Rank,  PharmD ?03/17/2022 4:19 PM ? ?

## 2022-03-17 NOTE — Progress Notes (Signed)
2 Days Post-Op  ?Subjective: ?Had some trouble with bleeding at incision yesterday.  Did not do any sitz baths yesterday evening ? ?Objective: ?Vital signs in last 24 hours: ?Temp:  [98.2 ?F (36.8 ?C)-100.4 ?F (38 ?C)] 99.1 ?F (37.3 ?C) (05/03 0515) ?Pulse Rate:  [78-102] 94 (05/03 0515) ?Resp:  [15-20] 20 (05/03 0515) ?BP: (109-129)/(65-70) 129/70 (05/03 0515) ?SpO2:  [91 %-98 %] 91 % (05/03 0515) ? ? ?Intake/Output from previous day: ?05/02 0701 - 05/03 0700 ?In: 2717.7 [P.O.:150; I.V.:2267.7; IV Piggyback:300] ?Out: 1750 [WUJWJ:1914] ?Intake/Output this shift: ?Total I/O ?In: 0  ?Out: 400 [Urine:400] ? ? ?General appearance: alert and cooperative ?GI: soft, distended ?Rectal: incision draining purulence, flushed with NS at bedside ? ?Lab Results:  ?Recent Labs  ?  03/15/22 ?0325 03/17/22 ?0313  ?WBC 29.2* 33.3*  ?HGB 10.8* 9.5*  ?HCT 31.4* 27.9*  ?PLT 162 290  ? ? ?BMET ?Recent Labs  ?  03/16/22 ?0444 03/17/22 ?0313  ?NA 131* 134*  ?K 4.2 4.5  ?CL 107 111  ?CO2 19* 18*  ?GLUCOSE 143* 96  ?BUN 27* 30*  ?CREATININE 0.80 0.95  ?CALCIUM 7.6* 7.7*  ? ? ?PT/INR ?No results for input(s): LABPROT, INR in the last 72 hours. ?ABG ?No results for input(s): PHART, HCO3 in the last 72 hours. ? ?Invalid input(s): PCO2, PO2 ? ?MEDS, Scheduled ? acetaminophen  1,000 mg Oral Q6H  ? amLODipine  5 mg Oral q AM  ? bisoprolol  10 mg Oral QHS  ? Chlorhexidine Gluconate Cloth  6 each Topical Daily  ? insulin aspart  0-9 Units Subcutaneous Q8H  ? lip balm  1 application. Topical BID  ? montelukast  10 mg Oral QHS  ? pantoprazole  40 mg Oral Daily  ? sodium chloride flush  10-40 mL Intracatheter Q12H  ? valACYclovir  500 mg Oral q AM  ? ? ?Studies/Results: ?No results found. ? ?Assessment: ?s/p  ?Patient Active Problem List  ? Diagnosis Date Noted  ? Acute urinary retention 03/12/2022  ? 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 Mild persistent asthma 03/12/2022  ? Abdominal pain 03/09/2022  ? Pelvic pain in male  03/09/2022  ? Pruritus ani 04/20/2018  ? Arthritis of both knees 01/13/2018  ? Hx of adenomatous polyp of colon 04/07/2017  ? Perianal dermatitis 01/14/2016  ? 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/12/2013  ? History of tobacco use 11/12/2013  ? Low testosterone 09/19/2013  ? Sinusitis 12/21/2012  ? 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 Eczema 10/30/2012  ? Prolapsed internal hemorrhoids, grade 3 10/30/2012  ? Hypertension 04/13/2011  ? [place], [place] 05/14/2010  ? NEOPLASM, SKIN, UNCERTAIN BEHAVIOR 78/29/5621  ? ALLERGIC RHINITIS 08/16/2008  ? Rash and other nonspecific skin eruption 07/02/2008  ? ABNORMAL COAGULATION PROFILE 05/29/2008  ? Gastroesophageal reflux disease 06/28/2007  ? PITYRIASIS ROSEA 06/28/2007  ? OSTEOARTHROSIS, LOCAL NOS, LOWER LEG 06/28/2007  ? Asthma, chronic 05/11/2007  ? ? ?Pelvic sepsis.  Currently drained and debrided.   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 ?Exam ok, vitals ok, appears mildly acidotic on chemistry labs, good UOP: no signs of end organ failures ? ?Ambulate in hall ?Recheck cbc in AM ?Sitz baths Q4h while awake, IV dilaudid for pain control ? ? ? LOS: 7 days  ? ? ? ?Marland KitchenRosario Adie, MD ?[facility] ? ? ? ?03/17/2022 ?11:13 AM ? ? ? ?  ?

## 2022-03-17 NOTE — Progress Notes (Signed)
Dr Marcello Moores paged per pt's significant other's request. Relayed several issues that he would like to discuss w MD. Numerous questions answered, allowed pt and spouse to vent and support given. Will cont to monitor ?

## 2022-03-18 DIAGNOSIS — K9189 Other postprocedural complications and disorders of digestive system: Secondary | ICD-10-CM

## 2022-03-18 DIAGNOSIS — R6520 Severe sepsis without septic shock: Secondary | ICD-10-CM | POA: Diagnosis not present

## 2022-03-18 DIAGNOSIS — E872 Acidosis, unspecified: Secondary | ICD-10-CM

## 2022-03-18 DIAGNOSIS — G8918 Other acute postprocedural pain: Secondary | ICD-10-CM

## 2022-03-18 DIAGNOSIS — D649 Anemia, unspecified: Secondary | ICD-10-CM

## 2022-03-18 DIAGNOSIS — D62 Acute posthemorrhagic anemia: Secondary | ICD-10-CM | POA: Insufficient documentation

## 2022-03-18 DIAGNOSIS — A419 Sepsis, unspecified organism: Secondary | ICD-10-CM

## 2022-03-18 DIAGNOSIS — K567 Ileus, unspecified: Secondary | ICD-10-CM

## 2022-03-18 LAB — COMPREHENSIVE METABOLIC PANEL
ALT: 28 U/L (ref 0–44)
AST: 19 U/L (ref 15–41)
Albumin: 1.8 g/dL — ABNORMAL LOW (ref 3.5–5.0)
Alkaline Phosphatase: 159 U/L — ABNORMAL HIGH (ref 38–126)
Anion gap: 6 (ref 5–15)
BUN: 24 mg/dL — ABNORMAL HIGH (ref 8–23)
CO2: 18 mmol/L — ABNORMAL LOW (ref 22–32)
Calcium: 7.6 mg/dL — ABNORMAL LOW (ref 8.9–10.3)
Chloride: 108 mmol/L (ref 98–111)
Creatinine, Ser: 0.75 mg/dL (ref 0.61–1.24)
GFR, Estimated: >60 mL/min (ref >60)
Glucose, Bld: 107 mg/dL — ABNORMAL HIGH (ref 70–99)
Potassium: 4.4 mmol/L (ref 3.5–5.1)
Sodium: 132 mmol/L — ABNORMAL LOW (ref 135–145)
Total Bilirubin: 1.5 mg/dL — ABNORMAL HIGH (ref 0.3–1.2)
Total Protein: 5.5 g/dL — ABNORMAL LOW (ref 6.5–8.1)

## 2022-03-18 LAB — MAGNESIUM: Magnesium: 1.7 mg/dL (ref 1.7–2.4)

## 2022-03-18 LAB — CBC WITH DIFFERENTIAL/PLATELET
Abs Immature Granulocytes: 0.74 10*3/uL — ABNORMAL HIGH (ref 0.00–0.07)
Basophils Absolute: 0.1 10*3/uL (ref 0.0–0.1)
Basophils Relative: 0 %
Eosinophils Absolute: 0 10*3/uL (ref 0.0–0.5)
Eosinophils Relative: 0 %
HCT: 22.7 % — ABNORMAL LOW (ref 39.0–52.0)
Hemoglobin: 7.6 g/dL — ABNORMAL LOW (ref 13.0–17.0)
Immature Granulocytes: 3 %
Lymphocytes Relative: 5 %
Lymphs Abs: 1.1 10*3/uL (ref 0.7–4.0)
MCH: 34.4 pg — ABNORMAL HIGH (ref 26.0–34.0)
MCHC: 33.5 g/dL (ref 30.0–36.0)
MCV: 102.7 fL — ABNORMAL HIGH (ref 80.0–100.0)
Monocytes Absolute: 0.9 10*3/uL (ref 0.1–1.0)
Monocytes Relative: 4 %
Neutro Abs: 22 10*3/uL — ABNORMAL HIGH (ref 1.7–7.7)
Neutrophils Relative %: 88 %
Platelets: 351 10*3/uL (ref 150–400)
RBC: 2.21 MIL/uL — ABNORMAL LOW (ref 4.22–5.81)
RDW: 15.1 % (ref 11.5–15.5)
WBC: 24.9 10*3/uL — ABNORMAL HIGH (ref 4.0–10.5)
nRBC: 0 % (ref 0.0–0.2)

## 2022-03-18 LAB — TROPONIN I (HIGH SENSITIVITY): Troponin I (High Sensitivity): 10 ng/L (ref <18)

## 2022-03-18 LAB — CBC
HCT: 21.5 % — ABNORMAL LOW (ref 39.0–52.0)
Hemoglobin: 7.2 g/dL — ABNORMAL LOW (ref 13.0–17.0)
MCH: 34.3 pg — ABNORMAL HIGH (ref 26.0–34.0)
MCHC: 33.5 g/dL (ref 30.0–36.0)
MCV: 102.4 fL — ABNORMAL HIGH (ref 80.0–100.0)
Platelets: 408 10*3/uL — ABNORMAL HIGH (ref 150–400)
RBC: 2.1 MIL/uL — ABNORMAL LOW (ref 4.22–5.81)
RDW: 15.1 % (ref 11.5–15.5)
WBC: 24.3 10*3/uL — ABNORMAL HIGH (ref 4.0–10.5)
nRBC: 0 % (ref 0.0–0.2)

## 2022-03-18 LAB — GLUCOSE, CAPILLARY
Glucose-Capillary: 98 mg/dL (ref 70–99)
Glucose-Capillary: 101 mg/dL — ABNORMAL HIGH (ref 70–99)
Glucose-Capillary: 65 mg/dL — ABNORMAL LOW (ref 70–99)
Glucose-Capillary: 131 mg/dL — ABNORMAL HIGH (ref 70–99)

## 2022-03-18 LAB — ABO/RH: ABO/RH(D): O POS

## 2022-03-18 LAB — PROCALCITONIN: Procalcitonin: 2.1 ng/mL

## 2022-03-18 LAB — TRIGLYCERIDES: Triglycerides: 142 mg/dL (ref <150)

## 2022-03-18 LAB — PROTIME-INR
INR: 1.4 — ABNORMAL HIGH (ref 0.8–1.2)
Prothrombin Time: 16.9 seconds — ABNORMAL HIGH (ref 11.4–15.2)

## 2022-03-18 LAB — PHOSPHORUS: Phosphorus: 2.8 mg/dL (ref 2.5–4.6)

## 2022-03-18 MED ORDER — DEXTROSE 50 % IV SOLN
12.5000 g | INTRAVENOUS | Status: AC
  Administered 2022-03-18: 12.5 g via INTRAVENOUS

## 2022-03-18 MED ORDER — MAGNESIUM SULFATE 2 GM/50ML IV SOLN
2.0000 g | Freq: Once | INTRAVENOUS | Status: AC
Start: 2022-03-18 — End: 2022-03-18
  Administered 2022-03-18: 2 g via INTRAVENOUS
  Filled 2022-03-18: qty 50

## 2022-03-18 MED ORDER — TRAVASOL 10 % IV SOLN
INTRAVENOUS | Status: AC
  Filled 2022-03-18: qty 1248

## 2022-03-18 MED ORDER — DEXTROSE 50 % IV SOLN
INTRAVENOUS | Status: AC
  Filled 2022-03-18: qty 50

## 2022-03-18 MED ORDER — FERROUS SULFATE 325 (65 FE) MG PO TABS
325.0000 mg | ORAL_TABLET | Freq: Three times a day (TID) | ORAL | Status: DC
  Administered 2022-03-18 – 2022-04-03 (×39): 325 mg via ORAL
  Filled 2022-03-18 (×41): qty 1

## 2022-03-18 MED ORDER — METHOCARBAMOL 500 MG PO TABS
500.0000 mg | ORAL_TABLET | Freq: Four times a day (QID) | ORAL | Status: DC | PRN
  Administered 2022-03-18 – 2022-03-19 (×6): 500 mg via ORAL
  Filled 2022-03-18 (×6): qty 1

## 2022-03-18 MED ORDER — SODIUM CHLORIDE 0.9 % IV SOLN: INTRAVENOUS | Status: DC | PRN

## 2022-03-18 NOTE — Plan of Care (Signed)

## 2022-03-18 NOTE — Progress Notes (Signed)
3 Days Post-Op  ?Subjective: ?Has pain with movement ? ?Objective: ?Vital signs in last 24 hours: ?Temp:  [98.3 ?F (36.8 ?C)-101.4 ?F (38.6 ?C)] 98.5 ?F (36.9 ?C) (05/04 0400) ?Pulse Rate:  [84-131] 86 (05/04 0600) ?Resp:  [16-26] 21 (05/04 0600) ?BP: (106-146)/(54-78) 140/71 (05/04 0600) ?SpO2:  [93 %-100 %] 97 % (05/04 0600) ? ? ?Intake/Output from previous day: ?05/03 0701 - 05/04 0700 ?In: 5780.5 [P.O.:95; I.V.:3440; IV Piggyback:2245.5] ?Out: 2625 [Urine:2625] ?Intake/Output this shift: ?Total I/O ?In: 8.2 [I.V.:3.4; IV Piggyback:4.8] ?Out: -  ? ? ?General appearance: alert and cooperative ?GI: soft, distended ?Rectal: incision clean, draining appropriately ? ?Lab Results:  ?Recent Labs  ?  03/17/22 ?1611 03/18/22 ?0340  ?WBC 28.8* 24.9*  ?HGB 8.4* 7.6*  ?HCT 25.8* 22.7*  ?PLT 346 351  ? ? ?BMET ?Recent Labs  ?  03/17/22 ?1611 03/18/22 ?0617  ?NA 132* 132*  ?K 4.6 4.4  ?CL 112* 108  ?CO2 14* 18*  ?GLUCOSE 122* 107*  ?BUN 28* 24*  ?CREATININE 0.92 0.75  ?CALCIUM 7.2* 7.6*  ? ? ?PT/INR ?Recent Labs  ?  03/18/22 ?0617  ?LABPROT 16.9*  ?INR 1.4*  ? ?ABG ?No results for input(s): PHART, HCO3 in the last 72 hours. ? ?Invalid input(s): PCO2, PO2 ? ?MEDS, Scheduled ? acetaminophen  1,000 mg Oral Q6H  ? amLODipine  5 mg Oral q AM  ? bisoprolol  10 mg Oral QHS  ? Chlorhexidine Gluconate Cloth  6 each Topical Q0600  ? ferrous sulfate  325 mg Oral TID  ? insulin aspart  0-9 Units Subcutaneous Q6H  ? lip balm  1 application. Topical BID  ? mouth rinse  15 mL Mouth Rinse BID  ? montelukast  10 mg Oral QHS  ? pantoprazole  40 mg Oral Daily  ? sodium chloride flush  10-40 mL Intracatheter Q12H  ? valACYclovir  500 mg Oral q AM  ? ? ?Studies/Results: ?DG Abd 2 Views ? ?Result Date: 03/17/2022 ?CLINICAL DATA:  Abdominal pain EXAM: ABDOMEN - 2 VIEW COMPARISON:  CT done on 03/14/2022 FINDINGS: Bowel gas pattern is nonspecific. Extraluminal air seen in the pelvis could not be distinctly identified in the radiographs. There is no  definite pneumothorax in the upright radiograph. There is infiltrate in the left lower lung fields suggesting atelectasis/pneumonia. Part of this finding may suggest pleural effusion. There is dextroscoliosis in the lumbar spine. Degenerative changes are noted with bony spurs and facet hypertrophy in the lumbar spine. Degenerative changes are noted in the left hip. IMPRESSION: Nonspecific bowel gas pattern. Increased density in the left lower lung fields suggests pleural effusion and infiltrates. Electronically Signed   By: Elmer Picker M.D.   On: 03/17/2022 16:22   ? ?Assessment: ?s/p  ?Patient Active Problem List  ? Diagnosis Date Noted  ? Acute urinary retention 03/12/2022  ? 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 Mild persistent asthma 03/12/2022  ? Abdominal pain 03/09/2022  ? Rectal perforation ([place]) 03/09/2022  ? Pruritus ani 04/20/2018  ? Arthritis of both knees 01/13/2018  ? Hx of adenomatous polyp of colon 04/07/2017  ? Perianal dermatitis 01/14/2016  ? 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/12/2013  ? History of tobacco use 11/12/2013  ? Low testosterone 09/19/2013  ? Sinusitis 12/21/2012  ? 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 Eczema 10/30/2012  ? Prolapsed internal hemorrhoids, grade 3 10/30/2012  ? Hypertension 04/13/2011  ? [place], [place] 05/14/2010  ? NEOPLASM, SKIN, UNCERTAIN BEHAVIOR 09/98/3382  ? ALLERGIC RHINITIS 08/16/2008  ? Rash  and other nonspecific skin eruption 07/02/2008  ? ABNORMAL COAGULATION PROFILE 05/29/2008  ? Gastroesophageal reflux disease 06/28/2007  ? PITYRIASIS ROSEA 06/28/2007  ? OSTEOARTHROSIS, LOCAL NOS, LOWER LEG 06/28/2007  ? Asthma, chronic 05/11/2007  ? ? ?Pelvic sepsis.  Currently drained and debrided.   ?OR findings: Pt with necrosis of his anoderm posteriorly on EUA, large R gluteal abscess- opened and drained ?Plan: ?Cont NPO and bowel rest for now ?Cont TPN ?Cont home meds ?Cont IV antibiotics, Flagyl, Vanc added to regimen 5/3 ? ?Exam ok, vitals ok,  good UOP: no signs of end organ failures ? ?Ambulate  ?SCD's ?Recheck cbc in AM ?Sitz baths Q4h while awake, IV dilaudid for pain control.  Pt declined PCA ? ? ? LOS: 8 days  ? ? ? ?Marland KitchenRosario Adie, MD ?[facility], [place] ? ? ? ?03/18/2022 ?7:31 AM ? ? ? ?  ?

## 2022-03-18 NOTE — Progress Notes (Signed)
PHARMACY - TOTAL PARENTERAL NUTRITION CONSULT NOTE  ? ?Indication: rectal perforation, prolonged NPO ? ?Patient Measurements: ?Height: '5\' 9"'$  (175.3 cm) ?Weight: 77.2 kg (170 lb 4.8 oz) ?IBW/kg (Calculated) : 70.7 ?TPN AdjBW (KG): 77.2 ?Body mass index is 25.15 kg/m?. ? ?Assessment: 71 yo male with history of rectal bleeding and prolapsed internal hemorrhoids who recently underwent hemorrhoid surgery w/ hemorrhoidal dearterialization on 03/05/22.  He presented to [facility] on [facility]/25 with increasing abdominal pain, nausea, and fever.  CT abdomen/pelvis with thickening of rectal wall; gas and fluid within the rectal wall and in presacral space concerning for rectal perforation.  Pharmacy has been consulted to manage TPN as patient unable to tolerate PO currently and for the foreseeable future.  ? ?Glucose / Insulin: no history of DM, CBGs show some hypoglycemia with range 50-143 ?- NO insulin given in past 24 hours  ?- Continue q6h monitoring for hypoglycemia ?Electrolytes:  Na 132 (down), Bicarb 18, Mag 1.7, CorrCa 9.4 WNL, other lytes wnl ?Renal: Scr < 1, BUN 24, improving.    ?Hepatic: AST/ALT wnl, Tbili 1.5 (down), albumin 1.8, TG 142 ?Intake / Output: UOP 2625 ml/24h, stool x1 ?- Net I/O +3L ?- MIVF: none per MD  ?GI Imaging: ?- 4/25 CTAP: rectal wall thickening w/ gas tracking into the anterior rectal wall, presacral fluid as well as stranding and gas in the mesorectal fat; consistent w/ rectal perforation ?- 4/30 CT scan: extraluminal air tracking into mesenteric space but no free peritoneal fluid   ?GI Surgeries / Procedures:  ?- 4/21 (prior to admit): hemorrhoid surgery w/ a dearterialization THD procedure ?- 5/1 right gluteal abscess with anal canal necrosis  ? ?Central access: PICC placed 4/28 ?TPN start date: 03/12/22 ? ?Nutritional Goals: ?Goal TPN rate is 100 mL/hr (provides 125 g of protein and 2395 kcals per day) ? ?RD Assessment:  (4/28) ?Estimated Needs ?Total Energy Estimated Needs: 1900-2100 ?Total Protein  Estimated Needs: 90-100g ?Total Fluid Estimated Needs: 2L/day ? ?5/4 Discussed with RD.  Planning to update goals to 2300-2500 kcal and 115-130 grams protein.  Updated TPN rate, protein, kcal, and elyte concentrations.   ? ? ?Current Nutrition:  ?NPO except for sips w/ meds ?TPN ? ?Plan:  ?TPN at 132m/hr (goal rate) ?Electrolytes in TPN:  ?Na 70 mEq/L  ?K  443m/L   ?Ca 10m22mL ?Mg 8 mEq/L  ?Phos 15 mmol/L ?Cl:Ac 1:2  ?Continue standard MVI and trace elements in TPN ?Continue Sensitive SSI q6h and adjust as needed  ?Monitor TPN labs on Mon/Thurs and PRN ? ? ?ChrGretta ArabarmD, BCPS ?Clinical Pharmacist ?[facility] [phone]/02/2022 10:51 AM ? ? ? ? ?

## 2022-03-18 NOTE — Progress Notes (Signed)
Nutrition Follow-up ? ?DOCUMENTATION CODES:  ? ?Not applicable ? ?INTERVENTION:  ?- continue TPN per Pharmacist.  ?- will monitor for possible need for micronutrient lab checks pending medical course.  ? ? ?NUTRITION DIAGNOSIS:  ? ?Increased nutrient needs related to acute illness, post-op healing as evidenced by estimated needs. -revised, ongoing ? ?GOAL:  ? ?Patient will meet greater than or equal to 90% of their needs -to be met with TPN regimen ? ?MONITOR:  ? ?Diet advancement, Labs, Weight trends, Skin, I & O's, Other (Comment) (TPN regimen) ? ?ASSESSMENT:  ? ?71 y.o. male with a past medical history of hypertension, GERD, status post appendectomy, status post inguinal hernia repair, status post transanal hemorrhoid dearterialization (03/05/2022 performed by Dr. Marcello Moores).  Presents to the emergency department with a complaint of abdominal pain, nausea, vomiting, and fever. ? ?Significant Events: ?4/25- admission ?4/26- diet changed from NPO to NPO with sips of clears allowed ?4/28- initial RD assessment; double lumen PICC placed in R brachial; TPN initiation ?5/1- anal exam with debridement, I&D of anal canal, proctoscopy d/t pelvic infection--found to have necrosis of anoderm and large R gluteal abscess which was opened and drained ? ? ?Able to talk with RN and Pharmacist concerning patient. RN shares that patient has very deep edema to scrotum and mild edema to BLE.  ? ?Alerted Pharmacist to re-estimated nutrition needs. Patient remains NPO and is receiving TPN at current rate of 80 ml/hr which is providing 1959 kcal (85% re-estimated kcal need) and 96 grams protein (83% re-estimated protein need). ? ?Plan for new goal TPN rate of 100 ml/hr to provide 2395 kcal and 125 grams protein.  ? ?Weight today is documented as 181 lb and weight on admission was documented as 170 lb.  ? ?RN shares that patient reported to him that he has lost weight since admission. Review of weight recordings as far back as 10/28/2016  indicate current weight is the highest recorded weight; this is impacted by current degree of edema.  ? ?CCS is primary with CCM following. Notes indicate ongoing plan for NPO and TPN d/t concern for ileus.  ? ?Patient laying in bed moaning and confirms that he is in a significant amount of pain. Visit brief. No visitors at bedside at the time of RD visit.  ? ? ?Labs reviewed; CBG: 98 mg/dl, Na: 132 mmol/l, BUN: 24 mg/dl, Ca: 7.6 mg/dl, Alk Phos elevated and up from 5/3, triglycerides: 142 mg/dl today. ? ?Medications reviewed; 325 mg ferrous sulfate/day TID, sliding scale novolog, imodium PRN, 40 mg oral protonix/day. ? ?IVF; NS @ 50 ml/hr. ? ? ?Diet Order:   ?Diet Order   ? ?       ?  Diet NPO time specified Except for: [employer], Sips with Meds, Other (See Comments)  Diet effective now       ?  ? ?  ?  ? ?  ? ? ?EDUCATION NEEDS:  ? ?Not appropriate for education at this time ? ?Skin:  Skin Assessment: Skin Integrity Issues: ?Skin Integrity Issues:: Incisions ?Incisions: rectum (5/1) ? ?Last BM:  5/3 (type 7 x1, small amount) ? ?Height:  ? ?Ht Readings from Last 1 Encounters:  ?03/18/22 $RemoveBe'5\' 9"'GgxpEBbsW$  (1.753 m)  ? ? ?Weight:  ? ?Wt Readings from Last 1 Encounters:  ?03/18/22 82.2 kg  ? ? ?BMI:  Body mass index is 26.76 kg/m?. ? ?Estimated Nutritional Needs:  ?Kcal:  2300-2500 kcal ?Protein:  115-130 grams ?Fluid:  >/= 2 L/day ? ? ? ? ?  Jarome Matin, MS, RD, LDN ?Registered Dietitian II ?Inpatient Clinical Nutrition ?RD pager # and on-call/weekend pager # available in [place]  ? ?

## 2022-03-18 NOTE — Progress Notes (Addendum)
? ?NAME:  Tyler [name], MRN:  [id number], DOB:  [birth year], LOS: 8 ?ADMISSION DATE:  03/09/2022, CONSULTATION DATE:  03/18/22 ?REFERRING MD:  Marcello Moores, CHIEF COMPLAINT:  sepsis  ? ?History of Present Illness:  ?Tyler [name] is a 71 y.o. M with PMH of Asthma, GERD and HTN who underwent a routine transanal hemorrhoidal dearterialization on 4/21.   He experienced worsening abdominal pain, nausea and fever, so presented to the ED on 4/25.   CT scan of the abdomen and pelvis was performed.  This showed thickening of the rectal wall.  He was admitted by surgery and treated conservatively with Cefepime and Flagyl, however continued to have pain and fever, and repeat CT with possible perforation, so was taken to the OR for I&D of the anal canal on 5/1 there was necrosis of the L posterior anal canal which was debrided.   He initially felt better, but over the last 48 hours has continued to be febrile with WBC increasing to 33k.   He reports continued lower abdominal pain and nausea.  BP has been stable, though HR increased to 120-130.  He has been passing gas and blood per rectum and making urine.  Pt was transferred to stepdown and PCCM consulted ? ?Pertinent  Medical History  ? 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 ? ? ?Significant Hospital Events: ?Including procedures, antibiotic start and stop dates in addition to other pertinent events   ?4/21 hemorrhoidal dearterialization ?4/25 admitted to surgery with fever and  pain, thickening of rectal wall  ?5/1 Taken to OR for anal canal I&D and rectal wall necrosis debridement ?5/3 signs of worsening sepsis, transfer to progressive and PCCM consult ?CT abd/pelvis 4/25 Rectal wall thickening with gas tracking into the anterior rectal ?wall, presacral fluid as well as stranding and gas in the mesorectal fat, findings most consistent with rectal perforation. CT abd/pelvis 4/30 1. Significant increase in extraluminal extraperitoneal gas in the abdomen and pelvis from CT 5 days ago as described above. Source of air likely represents rectal perforation. There is generalized increase in stranding and ill-defined fluid associated with this extraluminal air. No peripherally enhancing fluid collection. 2. Left inguinal hernia contains tracking air and small amount of non organized free fluid. ?5/4 HR better end organ fxn stable/  ? ? ? ?Interim History / Subjective:  ?Still has marked abdominal pain ?Objective   ?Blood pressure 139/66, pulse 99, temperature 100 ?F (37.8 ?C), temperature source Axillary, resp. rate (Abnormal) 30, height '5\' 9"'$  (1.753 m), weight 82.2 kg, SpO2 97 %. ?   ?   ? ?Intake/Output Summary (Last 24 hours) at 03/18/2022 1046 ?Last data filed at 03/18/2022 1000 ?Gross per 24 hour  ?Intake 6227.55 ml  ?Output 2225 ml  ?Net 4002.55 ml  ? ?Filed Weights  ? 03/09/22 1410 03/18/22 0800  ?Weight: 77.2 kg 82.2 kg  ? ?General 71 year old male patient resting in bed he has fairly significant abdominal discomfort ?HEENT normocephalic atraumatic ?Neuro awake oriented ?Cardiac regular rate and rhythm ?Pulmonary clear diminished bases ?Abdomen distended hypoactive tympanic to percussion marked discomfort diffusely ?Extremities warm dry, does have scrotal edema dressing intact ? ? ?  Resolved Hospital Problem list   ? ? ?Assessment & Plan:  ? ?Principal Problem: ?  Rectal perforation ([place]) ?Active Problems: ?  Abdominal pain ?  Acute urinary retention ?  Sepsis ([place]) ?  Ileus following  gastrointestinal surgery (HCC) ?  Normal anion gap metabolic acidosis ?  Anemia ?  Post-op pain ? ? ?Sepsis from anal abscess  c/ b Necrotic anal wall  ?S/p I&D and debridement on 5/1  ?-Received fluid challenge, antibiotics widened on 5/3, white blood cell count improved some, lactate negative ?Plan ?Cont IV hydration  ?Day 10 cefepime and flagyl; day 2 vanc; length or rx TBD. If cultures negative can prob stop vanc ?Cont wound care  ?Trend cbc ? ?Ileus exacerbated by narcotics ?Plan ?Mobilize ?Careful w/ narcs ? ?NAGMA in context of hyperchloremia  ?-bicarb improving ?Plan ?Cont LR  ? ?Anemia w/out element of bleeding ?Plan ?Trend cbc ?Trigger for transfer ? ? ?Best Practice (right click and "Reselect all SmartList Selections" daily)  ? ?Diet/type: TPN ?DVT prophylaxis: SCD ?GI prophylaxis: N/A ?Lines: yes and it is still needed ?Foley:  Yes, and it is still needed ?Code Status:  full code ?Last date of multidisciplinary goals of care discussion [per primary ] ? ?Critical care time:  NA  ?We will s/o call if needed ? ?CRITICAL CARE ? ?Erick Colace ACNP-BC ?[place] ?Pager # [phone] OR # [phone] if no answer ? ? ? ?

## 2022-03-19 ENCOUNTER — Inpatient Hospital Stay (HOSPITAL_COMMUNITY): Payer: BC Managed Care – PPO

## 2022-03-19 LAB — CBC
HCT: 21.5 % — ABNORMAL LOW (ref 39.0–52.0)
Hemoglobin: 7.2 g/dL — ABNORMAL LOW (ref 13.0–17.0)
MCH: 34 pg (ref 26.0–34.0)
MCHC: 33.5 g/dL (ref 30.0–36.0)
MCV: 101.4 fL — ABNORMAL HIGH (ref 80.0–100.0)
Platelets: 377 10*3/uL (ref 150–400)
RBC: 2.12 MIL/uL — ABNORMAL LOW (ref 4.22–5.81)
RDW: 15.1 % (ref 11.5–15.5)
WBC: 18.2 10*3/uL — ABNORMAL HIGH (ref 4.0–10.5)
nRBC: 0 % (ref 0.0–0.2)

## 2022-03-19 LAB — GLUCOSE, CAPILLARY
Glucose-Capillary: 112 mg/dL — ABNORMAL HIGH (ref 70–99)
Glucose-Capillary: 123 mg/dL — ABNORMAL HIGH (ref 70–99)
Glucose-Capillary: 70 mg/dL (ref 70–99)
Glucose-Capillary: 99 mg/dL (ref 70–99)

## 2022-03-19 LAB — BASIC METABOLIC PANEL
Anion gap: 9 (ref 5–15)
BUN: 37 mg/dL — ABNORMAL HIGH (ref 8–23)
CO2: 15 mmol/L — ABNORMAL LOW (ref 22–32)
Calcium: 7.5 mg/dL — ABNORMAL LOW (ref 8.9–10.3)
Chloride: 108 mmol/L (ref 98–111)
Creatinine, Ser: 0.95 mg/dL (ref 0.61–1.24)
GFR, Estimated: >60 mL/min (ref >60)
Glucose, Bld: 120 mg/dL — ABNORMAL HIGH (ref 70–99)
Potassium: 3.7 mmol/L (ref 3.5–5.1)
Sodium: 132 mmol/L — ABNORMAL LOW (ref 135–145)

## 2022-03-19 LAB — PHOSPHORUS: Phosphorus: 3.4 mg/dL (ref 2.5–4.6)

## 2022-03-19 LAB — PROCALCITONIN: Procalcitonin: 22.95 ng/mL

## 2022-03-19 LAB — MAGNESIUM: Magnesium: 2.3 mg/dL (ref 1.7–2.4)

## 2022-03-19 MED ORDER — TRAVASOL 10 % IV SOLN
INTRAVENOUS | Status: AC
  Filled 2022-03-19: qty 1248

## 2022-03-19 MED ORDER — POTASSIUM CHLORIDE CRYS ER 20 MEQ PO TBCR
40.0000 meq | EXTENDED_RELEASE_TABLET | Freq: Once | ORAL | Status: AC
  Administered 2022-03-19: 40 meq via ORAL
  Filled 2022-03-19: qty 2

## 2022-03-19 MED ORDER — IOHEXOL 300 MG/ML  SOLN
100.0000 mL | Freq: Once | INTRAMUSCULAR | Status: AC | PRN
  Administered 2022-03-19: 100 mL via INTRAVENOUS

## 2022-03-19 MED ORDER — SODIUM CHLORIDE (PF) 0.9 % IJ SOLN
INTRAMUSCULAR | Status: AC
  Filled 2022-03-19: qty 50

## 2022-03-19 MED ORDER — ACETAMINOPHEN 10 MG/ML IV SOLN
1000.0000 mg | Freq: Four times a day (QID) | INTRAVENOUS | Status: AC | PRN
  Administered 2022-03-19 – 2022-03-20 (×3): 1000 mg via INTRAVENOUS
  Filled 2022-03-19 (×3): qty 100

## 2022-03-19 NOTE — Anesthesia Preprocedure Evaluation (Addendum)
Anesthesia Evaluation  ?Patient identified by MRN, date of birth, ID band ?Patient awake ? ? ? ?Reviewed: ?Allergy & Precautions, NPO status , Patient's Chart, lab work & pertinent test results, reviewed documented beta blocker date and time  ? ?History of Anesthesia Complications ?Negative for: history of anesthetic complications ? ?Airway ?Mallampati: II ? ?TM Distance: >3 FB ?Neck ROM: Full ? ? ? Dental ?no notable dental hx. ? ?  ?Pulmonary ?asthma , former smoker,  ?  ?Pulmonary exam normal ? ? ? ? ? ? ? Cardiovascular ?hypertension, Pt. on medications and Pt. on home beta blockers ? ?Rhythm:Regular Rate:Tachycardia ? ? ?  ?Neuro/Psych ?negative neurological ROS ? negative psych ROS  ? GI/Hepatic ?Neg liver ROS, GERD  Medicated and Controlled,gas and fluid within the rectal wall and in the presacral space concerning for possible rectal perforation ?  ?Endo/Other  ?negative endocrine ROS ? Renal/GU ?negative Renal ROS  ?negative genitourinary ?  ?Musculoskeletal ? ?(+) Arthritis ,  ? Abdominal ?  ?Peds ? Hematology ? ?(+) Blood dyscrasia (Hgb 7.2), anemia ,   ?Anesthesia Other Findings ?Day of surgery medications reviewed with patient. ? Reproductive/Obstetrics ?negative OB ROS ? ?  ? ? ? ? ? ? ? ? ? ? ? ? ? ?  ?  ? ? ? ? ? ? ? ?Anesthesia Physical ?Anesthesia Plan ? ?ASA: 3 and emergent ? ?Anesthesia Plan: General  ? ?Post-op Pain Management: Tylenol PO (pre-op)*  ? ?Induction: Intravenous ? ?PONV Risk Score and Plan: 3 and Treatment may vary due to age or medical condition, Ondansetron, Dexamethasone and Midazolam ? ?Airway Management Planned: Oral ETT ? ?Additional Equipment: None ? ?Intra-op Plan:  ? ?Post-operative Plan: Extubation in OR ? ?Informed Consent: I have reviewed the patients History and Physical, chart, labs and discussed the procedure including the risks, benefits and alternatives for the proposed anesthesia with the patient or authorized representative who has  indicated his/her understanding and acceptance.  ? ? ? ?Dental advisory given ? ?Plan Discussed with: CRNA ? ?Anesthesia Plan Comments:   ? ? ? ? ?Anesthesia Quick Evaluation ? ?

## 2022-03-19 NOTE — Progress Notes (Signed)
Spoke with Dr. Harlow Asa this evening referencing the patient's uncontrolled pain/increasing confusion/abd distension and whether we could administer anything to aid in this discomfort. Dr. Harlow Asa gave a verbal order for Tylenol 1000 mg IV for the patient's relief and to continue with current plan of care until scheduled surgery on 03/20/22.  ?

## 2022-03-19 NOTE — Progress Notes (Signed)
Patient ID: Tyler [name], male   DOB: December 14, 1950, 71 y.o.   MRN: [id number] ? ? ? ? ? ?Discussed with Dr. Marcello Moores.  Plan OR in AM 5/6. ? ?Will monitor vital signs and urine output.  Will give volume if needed.  Continue current abx's and care in ICU. ? ?Armandina Gemma, MD ?[facility] ?A [facility] practice ?Office: [phone] ? ?

## 2022-03-19 NOTE — Progress Notes (Signed)
PHARMACY - TOTAL PARENTERAL NUTRITION CONSULT NOTE  ? ?Indication: rectal perforation, prolonged NPO ? ?Patient Measurements: ?Height: '5\' 9"'$  (175.3 cm) ?Weight: 82.2 kg (181 lb 3.5 oz) ?IBW/kg (Calculated) : 70.7 ?TPN AdjBW (KG): 82.2 ?Body mass index is 26.76 kg/m?. ? ?Assessment: 71 yo male with history of rectal bleeding and prolapsed internal hemorrhoids who recently underwent hemorrhoid surgery w/ hemorrhoidal dearterialization on 03/05/22.  He presented to [facility] on 4/25 with increasing abdominal pain, nausea, and fever.  CT abdomen/pelvis with thickening of rectal wall; gas and fluid within the rectal wall and in presacral space concerning for rectal perforation.  Pharmacy has been consulted to manage TPN as patient unable to tolerate PO currently and for the foreseeable future.  ? ?Glucose / Insulin: no history of DM, CBGs show 65 - 131 in previous 24 hours; hypoglycemia treated x 1 ?- 2 units insulin aspart given in past 24 hours  ?- Continue q6h monitoring for hypoglycemia ?Electrolytes:  Na 132 (steady), Bicarb 15, K 3.7 (replaced this am), CorrCa 9.58 WNL, other lytes wnl ?Renal: Scr < 1, BUN 37   ?Hepatic: AST/ALT wnl, Tbili 1.5 (down), albumin 1.8, TG 142 ?Intake / Output: UOP 2625 ml/24h, stool x1 ?- Net I/O +3L ?- MIVF: none per MD  ?GI Imaging: ?- 4/25 CTAP: rectal wall thickening w/ gas tracking into the anterior rectal wall, presacral fluid as well as stranding and gas in the mesorectal fat; consistent w/ rectal perforation ?- 4/30 CT scan: extraluminal air tracking into mesenteric space but no free peritoneal fluid   ?GI Surgeries / Procedures:  ?- 4/21 (prior to admit): hemorrhoid surgery w/ a dearterialization THD procedure ?- 5/1 right gluteal abscess with anal canal necrosis  ? ?Central access: PICC placed 4/28 ?TPN start date: 03/12/22 ? ?Nutritional Goals: ?Goal TPN rate is 100 mL/hr (provides 125 g of protein and 2395 kcals per day) ? ?RD Assessment:  (4/28) ?Estimated Needs ?Total Energy  Estimated Needs: 2300-2500 kcal ?Total Protein Estimated Needs: 115-130 grams ?Total Fluid Estimated Needs: >/= 2 L/day ? ?5/4 Discussed with RD.  Planning to update goals to 2300-2500 kcal and 115-130 grams protein.  Updated TPN rate, protein, kcal, and elyte concentrations.   ? ? ?Current Nutrition:  ?NPO except for sips w/ meds ?TPN ? ?Plan:  ?TPN at 134m/hr (goal rate) ?Electrolytes in TPN:  ?Na 90 mEq/L (increased) ?K  571m/L  (increased) ?Ca 59m77mL ?Mg 8 mEq/L  ?Phos 15 mmol/L ?Cl:Ac 1:2  ?Continue standard MVI and trace elements in TPN ?Continue Sensitive SSI q6h and adjust as needed  ?Monitor TPN labs on Mon/Thurs and PRN ? ? ?MelRoyetta AsalharmD, BCPS ?Clinical Pharmacist ?[place] utilize Amion for appropriate phone number to reach the unit pharmacist ([facility] [place]5/03/2022 8:06 AM ? ? ? ? ? ?

## 2022-03-19 NOTE — Progress Notes (Signed)
[facility] ADULT ICU REPLACEMENT PROTOCOL ? ? ?The patient does apply for the [facility] Adult ICU Electrolyte Replacment Protocol based on the criteria listed below:  ? ?1.Exclusion criteria: TCTS patients, ECMO patients, and Dialysis patients ?2. Is GFR >/= 30 ml/min? Yes.    ?Patient's GFR today is >60 ?3. Is SCr </= 2? Yes.   ?Patient's SCr is 0.95 mg/dL ?4. Did SCr increase >/= 0.5 in 24 hours? No ?5.Pt's weight >40kg  Yes.   ?6. Abnormal electrolyte(s): K+ 3.7  ?7. Electrolytes replaced per protocol ?8.  Call MD STAT for K+ </= 2.5, Phos </= 1, or Mag </= 1 ?Physician:  n/a ? ?Darlys Gales 03/19/2022 6:21 AM ? ?

## 2022-03-19 NOTE — Progress Notes (Signed)
Notified Dr. Harlow Asa of patients clinical presentation declining, patient is now confused, heart rate is elevated and tachypnea. Per Dr. Harlow Asa he will look at CT of abdomen and discuss with Dr. Marcello Moores  ?

## 2022-03-19 NOTE — Progress Notes (Addendum)
4 Days Post-Op  ?Subjective: ?Has pain with movement.  Febrile yesterday ? ?Objective: ?Vital signs in last 24 hours: ?Temp:  [98.6 ?F (37 ?C)-100.7 ?F (38.2 ?C)] 98.6 ?F (37 ?C) (05/05 0400) ?Pulse Rate:  [28-150] 83 (05/05 0400) ?Resp:  [19-42] 23 (05/05 0400) ?BP: (91-139)/(48-87) 133/65 (05/05 0657) ?SpO2:  [95 %-100 %] 98 % (05/05 0400) ? ? ?Intake/Output from previous day: ?05/04 0701 - 05/05 0700 ?In: 2794 [I.V.:2143.9; IV Piggyback:650.1] ?Out: 1100 [Urine:1100] ?Intake/Output this shift: ?No intake/output data recorded. ? ? ?General appearance: alert and cooperative ?GI: soft, distended ?Rectal: incision clean, draining appropriately, rectal stool containment system in place on outside  ? ?Lab Results:  ?Recent Labs  ?  03/18/22 ?2040 03/19/22 ?0500  ?WBC 24.3* 18.2*  ?HGB 7.2* 7.2*  ?HCT 21.5* 21.5*  ?PLT 408* 377  ? ? ?BMET ?Recent Labs  ?  03/18/22 ?0617 03/19/22 ?0500  ?NA 132* 132*  ?K 4.4 3.7  ?CL 108 108  ?CO2 18* 15*  ?GLUCOSE 107* 120*  ?BUN 24* 37*  ?CREATININE 0.75 0.95  ?CALCIUM 7.6* 7.5*  ? ? ?PT/INR ?Recent Labs  ?  03/18/22 ?0617  ?LABPROT 16.9*  ?INR 1.4*  ? ? ?ABG ?No results for input(s): PHART, HCO3 in the last 72 hours. ? ?Invalid input(s): PCO2, PO2 ? ?MEDS, Scheduled ? acetaminophen  1,000 mg Oral Q6H  ? amLODipine  5 mg Oral q AM  ? bisoprolol  10 mg Oral QHS  ? Chlorhexidine Gluconate Cloth  6 each Topical Q0600  ? ferrous sulfate  325 mg Oral TID  ? insulin aspart  0-9 Units Subcutaneous Q6H  ? lip balm  1 application. Topical BID  ? mouth rinse  15 mL Mouth Rinse BID  ? montelukast  10 mg Oral QHS  ? pantoprazole  40 mg Oral Daily  ? sodium chloride (PF)      ? sodium chloride flush  10-40 mL Intracatheter Q12H  ? valACYclovir  500 mg Oral q AM  ? ? ?Studies/Results: ?DG CHEST PORT 1 VIEW ? ?Result Date: 03/19/2022 ?CLINICAL DATA:  Sepsis. EXAM: PORTABLE CHEST 1 VIEW COMPARISON:  PA and lateral 01/15/2019, lung base images from abdomen pelvis CT 03/09/2022. FINDINGS: There is mild  cardiomegaly, increased central vascular prominence, evidence of early interstitial edema in the base of the lungs and small pleural effusions. There is hazy opacity in both lower lung fields which could indicate ground-glass edema, pneumonitis, atelectasis or combination. The mid and upper lung fields are clear.  There is no pneumothorax. Stable mediastinum with mild aortic tortuosity and atherosclerosis. Mild osteopenia. IMPRESSION: Cardiomegaly with mild perihilar vascular congestion, early findings of interstitial edema in the lung bases and small pleural effusions. There are hazy opacities in the lung bases which could be atelectasis, ground-glass edema, pneumonitis or combination. Clinical correlation and radiographic follow-up recommended. Electronically Signed   By: Telford Nab M.D.   On: 03/19/2022 07:39  ? ?DG Abd 2 Views ? ?Result Date: 03/17/2022 ?CLINICAL DATA:  Abdominal pain EXAM: ABDOMEN - 2 VIEW COMPARISON:  CT done on 03/14/2022 FINDINGS: Bowel gas pattern is nonspecific. Extraluminal air seen in the pelvis could not be distinctly identified in the radiographs. There is no definite pneumothorax in the upright radiograph. There is infiltrate in the left lower lung fields suggesting atelectasis/pneumonia. Part of this finding may suggest pleural effusion. There is dextroscoliosis in the lumbar spine. Degenerative changes are noted with bony spurs and facet hypertrophy in the lumbar spine. Degenerative changes are noted in  the left hip. IMPRESSION: Nonspecific bowel gas pattern. Increased density in the left lower lung fields suggests pleural effusion and infiltrates. Electronically Signed   By: Elmer Picker M.D.   On: 03/17/2022 16:22   ? ?Assessment: ?s/p  ?Patient Active Problem List  ? Diagnosis Date Noted  ? Sepsis ([place]) 03/18/2022  ? Ileus following gastrointestinal surgery ([place]) 03/18/2022  ? Normal anion gap metabolic acidosis 64/33/2951  ? Anemia 03/18/2022  ? Post-op pain 03/18/2022   ? Acute urinary retention 03/12/2022  ? 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 Mild persistent asthma 03/12/2022  ? Abdominal pain 03/09/2022  ? Rectal perforation ([place]) 03/09/2022  ? Pruritus ani 04/20/2018  ? Arthritis of both knees 01/13/2018  ? Hx of adenomatous polyp of colon 04/07/2017  ? Perianal dermatitis 01/14/2016  ? 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/12/2013  ? History of tobacco use 11/12/2013  ? Low testosterone 09/19/2013  ? Sinusitis 12/21/2012  ? 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 Eczema 10/30/2012  ? Prolapsed internal hemorrhoids, grade 3 10/30/2012  ? Hypertension 04/13/2011  ? [place], [place] 05/14/2010  ? NEOPLASM, SKIN, UNCERTAIN BEHAVIOR 88/41/6606  ? ALLERGIC RHINITIS 08/16/2008  ? Rash and other nonspecific skin eruption 07/02/2008  ? ABNORMAL COAGULATION PROFILE 05/29/2008  ? Gastroesophageal reflux disease 06/28/2007  ? PITYRIASIS [place] 06/28/2007  ? OSTEOARTHROSIS, LOCAL NOS, LOWER LEG 06/28/2007  ? Asthma, chronic 05/11/2007  ? ? ?Pelvic sepsis.  Currently drained and debrided.   ?OR findings: Pt with necrosis of his anoderm posteriorly on EUA, large R gluteal abscess- opened and drained ? ?CT 5/5 shows significant retroperitoneal contamination.   ?Plan: ?Cont NPO and bowel rest for now ?Cont TPN ? ?Cont IV antibiotics, Flagyl/Vanc added to regimen 5/3 ? ?CT concerning for ongoing infection.  Only other intervention available would be diverting ostomy.  ?Exam ok, vitals ok, good UOP: no signs of end organ failures ? ?Ambulate  ?SCD's ?Recheck cbc in AM ?Sitz baths PRN, IV dilaudid for pain control.  Pt declined PCA ?Pt's wbc decreasing but procalcitonin up and CT worse.  Discussed proceeding with diagnostic laparoscopy and possible ostomy depending on OR findings with the pt.  He is agreeable to this.   ? ? LOS: 9 days  ? ? ? ?Marland KitchenRosario Adie, MD ?[facility], [place] ? ? ? ?03/19/2022 ?8:17 AM ? ? ? ?  ?

## 2022-03-20 ENCOUNTER — Inpatient Hospital Stay (HOSPITAL_COMMUNITY): Payer: BC Managed Care – PPO | Admitting: Certified Registered Nurse Anesthetist

## 2022-03-20 ENCOUNTER — Encounter (HOSPITAL_COMMUNITY)
Admission: EM | Disposition: A | Discharge status: Rehabilitation Facility | Payer: Self-pay | Source: Home / Self Care | Attending: General Surgery

## 2022-03-20 ENCOUNTER — Encounter (HOSPITAL_COMMUNITY): Payer: Self-pay | Admitting: General Surgery

## 2022-03-20 DIAGNOSIS — Z933 Colostomy status: Secondary | ICD-10-CM

## 2022-03-20 DIAGNOSIS — K631 Perforation of intestine (nontraumatic): Secondary | ICD-10-CM

## 2022-03-20 HISTORY — DX: Colostomy status: Z93.3

## 2022-03-20 HISTORY — PX: LAPAROSCOPY: SHX197

## 2022-03-20 LAB — LACTIC ACID, PLASMA
Lactic Acid, Venous: 2.5 mmol/L (ref 0.5–1.9)
Lactic Acid, Venous: 2.9 mmol/L (ref 0.5–1.9)
Lactic Acid, Venous: 2.3 mmol/L (ref 0.5–1.9)

## 2022-03-20 LAB — CBC
HCT: 19.3 % — ABNORMAL LOW (ref 39.0–52.0)
HCT: 25.6 % — ABNORMAL LOW (ref 39.0–52.0)
Hemoglobin: 6.4 g/dL — CL (ref 13.0–17.0)
Hemoglobin: 8.9 g/dL — ABNORMAL LOW (ref 13.0–17.0)
MCH: 33.3 pg (ref 26.0–34.0)
MCH: 34.2 pg — ABNORMAL HIGH (ref 26.0–34.0)
MCHC: 33.2 g/dL (ref 30.0–36.0)
MCHC: 34.8 g/dL (ref 30.0–36.0)
MCV: 100.5 fL — ABNORMAL HIGH (ref 80.0–100.0)
MCV: 98.5 fL (ref 80.0–100.0)
Platelets: 400 10*3/uL (ref 150–400)
Platelets: 377 10*3/uL (ref 150–400)
RBC: 1.92 MIL/uL — ABNORMAL LOW (ref 4.22–5.81)
RBC: 2.6 MIL/uL — ABNORMAL LOW (ref 4.22–5.81)
RDW: 15.4 % (ref 11.5–15.5)
RDW: 15.9 % — ABNORMAL HIGH (ref 11.5–15.5)
WBC: 15.4 10*3/uL — ABNORMAL HIGH (ref 4.0–10.5)
WBC: 14.1 10*3/uL — ABNORMAL HIGH (ref 4.0–10.5)
nRBC: 0 % (ref 0.0–0.2)
nRBC: 0 % (ref 0.0–0.2)

## 2022-03-20 LAB — GLUCOSE, CAPILLARY
Glucose-Capillary: 107 mg/dL — ABNORMAL HIGH (ref 70–99)
Glucose-Capillary: 104 mg/dL — ABNORMAL HIGH (ref 70–99)
Glucose-Capillary: 157 mg/dL — ABNORMAL HIGH (ref 70–99)
Glucose-Capillary: 152 mg/dL — ABNORMAL HIGH (ref 70–99)

## 2022-03-20 LAB — BASIC METABOLIC PANEL
Anion gap: 8 (ref 5–15)
BUN: 45 mg/dL — ABNORMAL HIGH (ref 8–23)
CO2: 14 mmol/L — ABNORMAL LOW (ref 22–32)
Calcium: 7.2 mg/dL — ABNORMAL LOW (ref 8.9–10.3)
Chloride: 113 mmol/L — ABNORMAL HIGH (ref 98–111)
Creatinine, Ser: 1.31 mg/dL — ABNORMAL HIGH (ref 0.61–1.24)
GFR, Estimated: 59 mL/min — ABNORMAL LOW (ref >60)
Glucose, Bld: 117 mg/dL — ABNORMAL HIGH (ref 70–99)
Potassium: 4.2 mmol/L (ref 3.5–5.1)
Sodium: 135 mmol/L (ref 135–145)

## 2022-03-20 LAB — PREPARE RBC (CROSSMATCH)

## 2022-03-20 SURGERY — LAPAROSCOPY, DIAGNOSTIC
Anesthesia: General | Site: Abdomen

## 2022-03-20 MED ORDER — PHENYLEPHRINE HCL (PRESSORS) 10 MG/ML IV SOLN
INTRAVENOUS | Status: AC
  Filled 2022-03-20: qty 1

## 2022-03-20 MED ORDER — NOREPINEPHRINE 4 MG/250ML-% IV SOLN: 0.0000 ug/min | INTRAVENOUS | Status: DC

## 2022-03-20 MED ORDER — LACTATED RINGERS IV SOLN: INTRAVENOUS | Status: DC | PRN

## 2022-03-20 MED ORDER — SUCCINYLCHOLINE CHLORIDE 200 MG/10ML IV SOSY
PREFILLED_SYRINGE | INTRAVENOUS | Status: DC | PRN
  Administered 2022-03-20: 100 mg via INTRAVENOUS

## 2022-03-20 MED ORDER — HYDROMORPHONE HCL 1 MG/ML IJ SOLN
INTRAMUSCULAR | Status: AC
  Filled 2022-03-20: qty 2

## 2022-03-20 MED ORDER — PHENYLEPHRINE 80 MCG/ML (10ML) SYRINGE FOR IV PUSH (FOR BLOOD PRESSURE SUPPORT)
PREFILLED_SYRINGE | INTRAVENOUS | Status: DC | PRN
  Administered 2022-03-20: 80 ug via INTRAVENOUS

## 2022-03-20 MED ORDER — INSULIN ASPART 100 UNIT/ML IJ SOLN
0.0000 [IU] | INTRAMUSCULAR | Status: DC
  Administered 2022-03-21 (×3): 1 [IU] via SUBCUTANEOUS
  Administered 2022-03-21: 2 [IU] via SUBCUTANEOUS
  Administered 2022-03-21 – 2022-03-26 (×14): 1 [IU] via SUBCUTANEOUS

## 2022-03-20 MED ORDER — BUPIVACAINE-EPINEPHRINE 0.25% -1:200000 IJ SOLN
INTRAMUSCULAR | Status: DC | PRN
  Administered 2022-03-20: 5 mL

## 2022-03-20 MED ORDER — MIDAZOLAM HCL 2 MG/2ML IJ SOLN
INTRAMUSCULAR | Status: DC | PRN
  Administered 2022-03-20 (×2): 1 mg via INTRAVENOUS

## 2022-03-20 MED ORDER — SUCCINYLCHOLINE CHLORIDE 200 MG/10ML IV SOSY
PREFILLED_SYRINGE | INTRAVENOUS | Status: AC
  Filled 2022-03-20: qty 10

## 2022-03-20 MED ORDER — 0.9 % SODIUM CHLORIDE (POUR BTL) OPTIME
TOPICAL | Status: DC | PRN
  Administered 2022-03-20: 1000 mL
  Administered 2022-03-20 (×3): 3000 mL

## 2022-03-20 MED ORDER — PHENYLEPHRINE HCL-NACL 20-0.9 MG/250ML-% IV SOLN
INTRAVENOUS | Status: DC | PRN
Start: 2022-03-20 — End: 2022-03-20
  Administered 2022-03-20: 50 ug/min via INTRAVENOUS

## 2022-03-20 MED ORDER — TRAVASOL 10 % IV SOLN
INTRAVENOUS | Status: AC
  Filled 2022-03-20: qty 1248

## 2022-03-20 MED ORDER — DEXAMETHASONE SODIUM PHOSPHATE 10 MG/ML IJ SOLN
INTRAMUSCULAR | Status: DC | PRN
  Administered 2022-03-20: 5 mg via INTRAVENOUS

## 2022-03-20 MED ORDER — SODIUM CHLORIDE 0.9 % IV SOLN: INTRAVENOUS | Status: DC | PRN

## 2022-03-20 MED ORDER — LACTATED RINGERS IV BOLUS
1000.0000 mL | Freq: Once | INTRAVENOUS | Status: AC
  Administered 2022-03-20: 1000 mL via INTRAVENOUS

## 2022-03-20 MED ORDER — FENTANYL CITRATE (PF) 250 MCG/5ML IJ SOLN
INTRAMUSCULAR | Status: DC | PRN
  Administered 2022-03-20 (×2): 50 ug via INTRAVENOUS

## 2022-03-20 MED ORDER — PHENYLEPHRINE 80 MCG/ML (10ML) SYRINGE FOR IV PUSH (FOR BLOOD PRESSURE SUPPORT)
PREFILLED_SYRINGE | INTRAVENOUS | Status: AC
  Filled 2022-03-20: qty 10

## 2022-03-20 MED ORDER — ALBUTEROL SULFATE HFA 108 (90 BASE) MCG/ACT IN AERS
INHALATION_SPRAY | RESPIRATORY_TRACT | Status: AC
  Filled 2022-03-20: qty 6.7

## 2022-03-20 MED ORDER — LACTATED RINGERS IV SOLN: INTRAVENOUS | Status: DC

## 2022-03-20 MED ORDER — FENTANYL CITRATE (PF) 100 MCG/2ML IJ SOLN
INTRAMUSCULAR | Status: AC
  Filled 2022-03-20: qty 2

## 2022-03-20 MED ORDER — ALBUTEROL SULFATE HFA 108 (90 BASE) MCG/ACT IN AERS
INHALATION_SPRAY | RESPIRATORY_TRACT | Status: DC | PRN
  Administered 2022-03-20: 6 via RESPIRATORY_TRACT

## 2022-03-20 MED ORDER — ROCURONIUM BROMIDE 10 MG/ML (PF) SYRINGE
PREFILLED_SYRINGE | INTRAVENOUS | Status: DC | PRN
Start: 2022-03-20 — End: 2022-03-20
  Administered 2022-03-20: 40 mg via INTRAVENOUS
  Administered 2022-03-20: 20 mg via INTRAVENOUS

## 2022-03-20 MED ORDER — LIDOCAINE HCL (PF) 2 % IJ SOLN
INTRAMUSCULAR | Status: AC
  Filled 2022-03-20: qty 5

## 2022-03-20 MED ORDER — HYDROMORPHONE HCL 1 MG/ML IJ SOLN
0.2500 mg | INTRAMUSCULAR | Status: DC | PRN
  Administered 2022-03-20 (×3): 0.5 mg via INTRAVENOUS

## 2022-03-20 MED ORDER — ALUM & MAG HYDROXIDE-SIMETH 200-200-20 MG/5ML PO SUSP: 30.0000 mL | Freq: Four times a day (QID) | ORAL | Status: DC | PRN

## 2022-03-20 MED ORDER — ACETAMINOPHEN 500 MG PO TABS: 1000.0000 mg | ORAL_TABLET | Freq: Once | ORAL | Status: DC

## 2022-03-20 MED ORDER — PROPOFOL 10 MG/ML IV BOLUS
INTRAVENOUS | Status: AC
  Filled 2022-03-20: qty 20

## 2022-03-20 MED ORDER — VANCOMYCIN HCL IN DEXTROSE 1-5 GM/200ML-% IV SOLN
INTRAVENOUS | Status: AC
  Filled 2022-03-20: qty 200

## 2022-03-20 MED ORDER — DEXAMETHASONE SODIUM PHOSPHATE 10 MG/ML IJ SOLN
INTRAMUSCULAR | Status: AC
  Filled 2022-03-20: qty 1

## 2022-03-20 MED ORDER — MIDAZOLAM HCL 2 MG/2ML IJ SOLN
INTRAMUSCULAR | Status: AC
  Filled 2022-03-20: qty 2

## 2022-03-20 MED ORDER — BUPIVACAINE-EPINEPHRINE (PF) 0.25% -1:200000 IJ SOLN
INTRAMUSCULAR | Status: AC
  Filled 2022-03-20: qty 30

## 2022-03-20 MED ORDER — INSULIN ASPART 100 UNIT/ML IJ SOLN
0.0000 [IU] | INTRAMUSCULAR | Status: DC
  Administered 2022-03-20: 2 [IU] via SUBCUTANEOUS

## 2022-03-20 MED ORDER — PROPOFOL 10 MG/ML IV BOLUS
INTRAVENOUS | Status: DC | PRN
  Administered 2022-03-20: 100 mg via INTRAVENOUS

## 2022-03-20 MED ORDER — SODIUM CHLORIDE 0.9% IV SOLUTION: Freq: Once | INTRAVENOUS | Status: DC

## 2022-03-20 MED ORDER — FAMOTIDINE IN NACL 20-0.9 MG/50ML-% IV SOLN
20.0000 mg | Freq: Two times a day (BID) | INTRAVENOUS | Status: DC
  Administered 2022-03-20 – 2022-03-21 (×3): 20 mg via INTRAVENOUS
  Filled 2022-03-20 (×4): qty 50

## 2022-03-20 MED ORDER — ROCURONIUM BROMIDE 10 MG/ML (PF) SYRINGE
PREFILLED_SYRINGE | INTRAVENOUS | Status: AC
  Filled 2022-03-20: qty 10

## 2022-03-20 MED ORDER — SUGAMMADEX SODIUM 200 MG/2ML IV SOLN
INTRAVENOUS | Status: DC | PRN
  Administered 2022-03-20: 200 mg via INTRAVENOUS

## 2022-03-20 MED ORDER — LACTATED RINGERS IR SOLN
Status: DC | PRN
  Administered 2022-03-20: 1000 mL

## 2022-03-20 MED ORDER — ONDANSETRON HCL 4 MG/2ML IJ SOLN
INTRAMUSCULAR | Status: AC
  Filled 2022-03-20: qty 2

## 2022-03-20 MED ORDER — ONDANSETRON HCL 4 MG/2ML IJ SOLN
INTRAMUSCULAR | Status: DC | PRN
  Administered 2022-03-20: 4 mg via INTRAVENOUS

## 2022-03-20 MED ORDER — ALBUMIN HUMAN 25 % IV SOLN
12.5000 g | Freq: Once | INTRAVENOUS | Status: AC
  Administered 2022-03-20: 12.5 g via INTRAVENOUS
  Filled 2022-03-20: qty 50

## 2022-03-20 SURGICAL SUPPLY — 68 items
ADH SKN CLS APL DERMABOND .7 (GAUZE/BANDAGES/DRESSINGS)
APPLIER CLIP 5 13 M/L LIGAMAX5 (MISCELLANEOUS)
APPLIER CLIP ROT 10 11.4 M/L (STAPLE)
APR CLP MED LRG 11.4X10 (STAPLE)
APR CLP MED LRG 5 ANG JAW (MISCELLANEOUS)
BAG COUNTER SPONGE SURGICOUNT (BAG) IMPLANT
BAG SPNG CNTER NS LX DISP (BAG)
BLADE EXTENDED COATED 6.5IN (ELECTRODE) IMPLANT
CANISTER WOUNDNEG PRESSURE 500 (CANNISTER) ×1 IMPLANT
CATH ROBINSON RED A/P 16FR (CATHETERS) ×1 IMPLANT
CELLS DAT CNTRL 66122 CELL SVR (MISCELLANEOUS) ×1 IMPLANT
CLIP APPLIE 5 13 M/L LIGAMAX5 (MISCELLANEOUS) IMPLANT
CLIP APPLIE ROT 10 11.4 M/L (STAPLE) IMPLANT
CLSR STERI-STRIP ANTIMIC 1/2X4 (GAUZE/BANDAGES/DRESSINGS) ×1 IMPLANT
COVER MAYO STAND STRL (DRAPES) ×2 IMPLANT
DERMABOND ADVANCED (GAUZE/BANDAGES/DRESSINGS)
DERMABOND ADVANCED .7 DNX12 (GAUZE/BANDAGES/DRESSINGS) IMPLANT
DRAIN CHANNEL 19F RND (DRAIN) ×3 IMPLANT
DRAIN PENROSE 0.5X18 (DRAIN) ×1 IMPLANT
DRSG TEGADERM 2-3/8X2-3/4 SM (GAUZE/BANDAGES/DRESSINGS) ×1 IMPLANT
DRSG VAC ATS SM SENSATRAC (GAUZE/BANDAGES/DRESSINGS) ×1 IMPLANT
ELECT REM PT RETURN 15FT ADLT (MISCELLANEOUS) ×2 IMPLANT
EVACUATOR SILICONE 100CC (DRAIN) ×3 IMPLANT
GAUZE SPONGE 2X2 8PLY STRL LF (GAUZE/BANDAGES/DRESSINGS) IMPLANT
GAUZE SPONGE 4X4 12PLY STRL (GAUZE/BANDAGES/DRESSINGS) ×2 IMPLANT
GLOVE BIO SURGEON STRL SZ 6.5 (GLOVE) ×4 IMPLANT
GLOVE BIOGEL PI IND STRL 7.0 (GLOVE) ×1 IMPLANT
GLOVE BIOGEL PI INDICATOR 7.0 (GLOVE) ×1
GLOVE INDICATOR 6.5 STRL GRN (GLOVE) ×2 IMPLANT
GOWN STRL REUS W/ TWL XL LVL3 (GOWN DISPOSABLE) ×3 IMPLANT
GOWN STRL REUS W/TWL XL LVL3 (GOWN DISPOSABLE) ×6
HANDLE SUCTION POOLE (INSTRUMENTS) IMPLANT
IRRIG SUCT STRYKERFLOW 2 WTIP (MISCELLANEOUS) ×2
IRRIGATION SUCT STRKRFLW 2 WTP (MISCELLANEOUS) ×1 IMPLANT
KIT TURNOVER KIT A (KITS) IMPLANT
PACK COLON (CUSTOM PROCEDURE TRAY) ×2 IMPLANT
RETRACTOR WND ALEXIS 18 MED (MISCELLANEOUS) IMPLANT
RTRCTR WOUND ALEXIS 18CM MED (MISCELLANEOUS) ×2
SEALER TISSUE G2 STRG ARTC 35C (ENDOMECHANICALS) IMPLANT
SET TUBE SMOKE EVAC HIGH FLOW (TUBING) ×2 IMPLANT
SLEEVE XCEL OPT CAN 5 100 (ENDOMECHANICALS) ×2 IMPLANT
SPIKE FLUID TRANSFER (MISCELLANEOUS) ×2 IMPLANT
SPONGE GAUZE 2X2 STER 10/PKG (GAUZE/BANDAGES/DRESSINGS) ×1
SPONGE T-LAP 18X18 ~~LOC~~+RFID (SPONGE) ×2 IMPLANT
STAPLER PROXIMATE 75MM BLUE (STAPLE) ×1 IMPLANT
STAPLER VISISTAT 35W (STAPLE) IMPLANT
SUCTION POOLE HANDLE (INSTRUMENTS)
SUT ETHILON 2 0 PS N (SUTURE) ×4 IMPLANT
SUT NOVA 1 T20/GS 25DT (SUTURE) IMPLANT
SUT PDS AB 1 CT1 27 (SUTURE) ×5 IMPLANT
SUT PDS AB 1 TP1 96 (SUTURE) IMPLANT
SUT PROLENE 2 0 KS (SUTURE) IMPLANT
SUT PROLENE 2 0 SH DA (SUTURE) IMPLANT
SUT SILK 2 0 (SUTURE) ×2
SUT SILK 2 0 SH CR/8 (SUTURE) ×2 IMPLANT
SUT SILK 2-0 18XBRD TIE 12 (SUTURE) ×1 IMPLANT
SUT SILK 3 0 (SUTURE) ×2
SUT SILK 3 0 SH CR/8 (SUTURE) ×2 IMPLANT
SUT SILK 3-0 18XBRD TIE 12 (SUTURE) ×1 IMPLANT
SUT VIC AB 2-0 SH 18 (SUTURE) ×3 IMPLANT
SUT VICRYL 0 UR6 27IN ABS (SUTURE) ×1 IMPLANT
TOWEL OR 17X26 10 PK STRL BLUE (TOWEL DISPOSABLE) IMPLANT
TOWEL OR NON WOVEN STRL DISP B (DISPOSABLE) ×2 IMPLANT
TRAY FOLEY MTR SLVR 16FR STAT (SET/KITS/TRAYS/PACK) ×1 IMPLANT
TROCAR BLADELESS OPT 5 100 (ENDOMECHANICALS) ×2 IMPLANT
TROCAR XCEL BLUNT TIP 100MML (ENDOMECHANICALS) ×1 IMPLANT
TROCAR XCEL NON-BLD 11X100MML (ENDOMECHANICALS) IMPLANT
TUBING CONNECTING 10 (TUBING) ×4 IMPLANT

## 2022-03-20 NOTE — Consult Note (Signed)
[place] Nurse Consult Note: ?[place] Nurse is consulted for new ostomy and NPWT dressing changes. Dr. Marcello Moores has requested T/TH/Sa dressing changes at 34mHg. ? ?[place]urse will see Monday, 5/8 to assess stoma and to order NPWT supplies for Tuesday change. ? ?[place] will follow, and will remain available to this patient, the nursing, surgical and medical teams.   ? ? ?Thank you for inviting [place] participate in this patient's POC. ? ?LMaudie Flakes MSN, RN, [place] CWON-AP, [place] ?Pager# [phone] ? ? ?  ?

## 2022-03-20 NOTE — Anesthesia Postprocedure Evaluation (Signed)
Anesthesia Post Note ? ?Patient: Tyler [name] ? ?Procedure(s) Performed: DIAGNOSTIC LAPAROSCOPY; ABDOMINAL [place] OUT AND DRAIN PLACEMENT; CREATION OF DIVERTING COLOSTOMY; ANAL EXAM UNDER ANESTHESIA (Abdomen) ? ?  ? ?Patient location during evaluation: PACU ?Anesthesia Type: General ?Level of consciousness: awake and alert and confused ?Pain management: pain level controlled ?Vital Signs Assessment: post-procedure vital signs reviewed and stable ?Respiratory status: spontaneous breathing, respiratory function stable and patient connected to face mask oxygen ?Cardiovascular status: blood pressure returned to baseline and tachycardic ?Postop Assessment: no apparent nausea or vomiting ?Anesthetic complications: no ?Comments: Patient continues to be tachypneic in high 20s, similar to preop status, but somewhat improved after IV dilaudid in PACU. MAPs low 70s, stable since discontinuation of phenylephrine in OR. Patient is more confused compared to preop. Patient discussed with Dr. Marcello Moores and plan for close monitoring in ICU. Daiva Huge, MD ? ? ?No notable events documented. ? ?Last Vitals:  ?Vitals:  ? 03/20/22 1115 03/20/22 1130  ?BP: 95/64 (!) 86/61  ?Pulse: (!) 102 100  ?Resp: (!) 31 (!) 28  ?Temp:  36.9 ?C  ?SpO2: 97% 97%  ?  ?Last Pain:  ?Vitals:  ? 03/20/22 1115  ?TempSrc:   ?PainSc: Asleep  ? ? ?  ?  ?  ?  ?  ?  ? ?Tyler [name] ? ? ? ? ?

## 2022-03-20 NOTE — Progress Notes (Signed)
eLink Physician-Brief Progress Note ?Patient Name: Tyler [name] ?DOB: 1951-06-12 ?MRN: [id number] ? ? ?Date of Service ? 03/20/2022  ?HPI/Events of Note ? Multiple issues: 1. Lactic Acid = 2.9. 2. Nursing request to change Q 6 hour blood glucoseNovolog SSI to Q 4 hours.  ?eICU Interventions ? Plan: ?25% Albumin 12.5 gm IV now. ?Monitor CVP now and Q 4 hours. ?Continue to trend Lactic Acid.  ?Change to Q 4 hour sensitive Novolog SSI. ?  ? ? ? ?Intervention Category ?Major Interventions: Acid-Base disturbance - evaluation and management;Hyperglycemia - active titration of insulin therapy ? ?Rickie Gange Cornelia Copa ?03/20/2022, 8:00 PM ?

## 2022-03-20 NOTE — Progress Notes (Addendum)
Called general surgeon on call, Dr. Harlow Asa, to report critical lab value of Hgb 6.4 for this patient. Dr. Harlow Asa would like one unit of blood to be administered now and for the blood bank to hold the second unit for the OR when the patient is taken down for surgery.  ?

## 2022-03-20 NOTE — Anesthesia Procedure Notes (Signed)
Procedure Name: Intubation ?Date/Time: 03/20/2022 7:44 AM ?Performed by: Raenette Rover, CRNA ?Pre-anesthesia Checklist: Patient identified, Emergency Drugs available, Suction available and Patient being monitored ?Patient Re-evaluated:Patient Re-evaluated prior to induction ?Oxygen Delivery Method: Circle system utilized ?Preoxygenation: Pre-oxygenation with 100% oxygen ?Induction Type: IV induction ?Ventilation: Mask ventilation without difficulty ?Laryngoscope Size: Mac and 4 ?Grade View: Grade I ?Tube type: Oral ?Tube size: 7.5 mm ?Number of attempts: 1 ?Airway Equipment and Method: Stylet ?Placement Confirmation: ETT inserted through vocal cords under direct vision, positive ETCO2 and breath sounds checked- equal and bilateral ?Secured at: 24 cm ?Tube secured with: Tape ?Dental Injury: Teeth and Oropharynx as per pre-operative assessment  ? ? ? ? ?

## 2022-03-20 NOTE — Progress Notes (Signed)
eLink Physician-Brief Progress Note ?Patient Name: Tyler [name] ?DOB: May 17, 1951 ?MRN: [id number] ? ? ?Date of Service ? 03/20/2022  ?HPI/Events of Note ? Multiple issues: 1. Patient c/o pain related to surgery - Patient already on Dilaudid 0.5 - 1 mg IV Q 2 hours PRN. Software will not allow for increased Dilaudid dosing more frequently than 2 hours. 2. Patient c/o reflux.  ?eICU Interventions ? Plan: ?Pepcid 20 mg IV now and BID.  ? ? ? ?Intervention Category ?Major Interventions: Other: ? ?Lalena Salas Cornelia Copa ?03/20/2022, 9:59 PM ?

## 2022-03-20 NOTE — Progress Notes (Signed)
? ?NAME:  Tyler [name], MRN:  [id number], DOB:  Aug 24, 1951, LOS: 10 ?ADMISSION DATE:  03/09/2022, CONSULTATION DATE:  03/20/22 ?REFERRING MD:  Marcello Moores, CHIEF COMPLAINT:  sepsis  ? ?History of Present Illness:  ?Tyler [name] is a 71 y.o. M with PMH of Asthma, GERD and HTN who underwent a routine transanal hemorrhoidal dearterialization on 4/21.   He experienced worsening abdominal pain, nausea and fever, so presented to the ED on 4/25.   CT scan of the abdomen and pelvis was performed.  This showed thickening of the rectal wall.  He was admitted by surgery and treated conservatively with Cefepime and Flagyl, however continued to have pain and fever, and repeat CT with possible perforation, so was taken to the OR for I&D of the anal canal on 5/1 there was necrosis of the L posterior anal canal which was debrided.   He initially felt better, but over the last 48 hours has continued to be febrile with WBC increasing to 33k.   He reports continued lower abdominal pain and nausea.  BP has been stable, though HR increased to 120-130.  He has been passing gas and blood per rectum and making urine.  Pt was transferred to stepdown and PCCM consulted ? ?Pertinent  Medical History  ? 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 ? ? ?Significant Hospital Events: ?Including procedures, antibiotic start and stop dates in addition to other pertinent events   ?4/21 hemorrhoidal dearterialization ?4/25 admitted to surgery with fever and  pain, thickening of rectal wall  ?5/1 Taken to OR for anal canal I&D and rectal wall necrosis debridement ?5/3 signs of worsening sepsis, transfer to progressive and PCCM consult ?CT abd/pelvis 4/25 Rectal wall thickening with gas tracking into the anterior rectal ?wall, presacral fluid as well as stranding and gas in the mesorectal fat, findings most consistent with rectal perforation. CT abd/pelvis 4/30 1. Significant increase in extraluminal extraperitoneal gas in the abdomen and pelvis from CT 5 days ago as described above. Source of air likely represents rectal perforation. There is generalized increase in stranding and ill-defined fluid associated with this extraluminal air. No peripherally enhancing fluid collection. 2. Left inguinal hernia contains tracking air and small amount of non organized free fluid. ?5/4 HR better end organ fxn stable ?5/6 Has worsening infection on CT scan and patient taken back to OR for diverting colostomy and abd wash out.  PCCM reconsulted for hypotension ? ? ?Interim History / Subjective:  ? ?Went back to the OR today for abdominal washout and diverting colostomy.  Has soft blood pressures post procedure ?Complains of abdominal pain ?Received 2 units of blood today ? ?Objective   ?Blood pressure (!) 149/37, pulse (!) 103, temperature 99.2 ?F (37.3 ?C), resp. rate (!) 23, height '5\' 9"'$  (1.753 m), weight 82.2 kg, SpO2 97 %. ?   ?   ? ?Intake/Output Summary (Last 24 hours) at 03/20/2022 1129 ?Last data filed at 03/20/2022 1034 ?Gross per 24 hour  ?Intake 5701.18 ml  ?Output 2825 ml  ?Net 2876.18 ml  ? ?Filed Weights  ? 03/09/22 1410 03/18/22 0800  ?Weight: 77.2 kg 82.2 kg  ? ?  Blood pressure (!) 93/54, pulse 95, temperature 99.4 ?F (37.4 ?C), temperature source Axillary, resp. rate (!) 26, height '5\' 9"'$  (1.610 m), weight 82.2 kg, SpO2 95 %. ?Gen:      No acute distress ?HEENT:  EOMI, sclera anicteric ?Neck:     No masses; no thyromegaly ?Lungs:    Clear to auscultation bilaterally; normal  respiratory effort ?CV:         Regular rate and rhythm; no murmurs ?Abd:      + bowel sounds; soft, non-tender; no palpable masses, no distension ?Ext:    No edema; adequate peripheral perfusion ?Skin:      Warm and dry; no rash ?Neuro: alert and oriented x 3 ?Psych: normal mood and affect  ? ?Labs/imaging reviewed with ?Hemoglobin 6.4, WBC 15.4 ? ? ?Resolved Hospital Problem list   ? ? ?Assessment & Plan:  ? ?Principal Problem: ?  Rectal perforation ([place]) ?Active Problems: ?  Abdominal pain ?  Acute urinary retention ?  Sepsis ([place]) ?  Ileus following gastrointestinal surgery (HCC) ?  Normal anion gap metabolic acidosis ?  Anemia ?  Post-op pain ? ? ?Sepsis from anal abscess  c/ b Necrotic anal wall  ?S/p I&D and debridement on 5/1  ?He has ongoing abdominal infection with diverting ostomy, washout today ?Continue antibiotics.  Vanco, Flagyl, cefepime ?Additional LR bolus now ?Start low-dose Levophed if blood pressure is still low ?Check lactic acid ? ?Ileus exacerbated by narcotics ?Plan ?Monitor ? ?NAGMA in context of hyperchloremia  ?-bicarb improving ?Plan ?Repeat lab ? ?Anemia ?S/p 2 units PRBCs in OR 5/ ?Plan ?Trend cbc ? ? ?Best Practice (right click and "Reselect all SmartList Selections" daily)  ? ?Diet/type: TPN ?DVT prophylaxis: SCD ?GI prophylaxis: N/A ?Lines: yes and it is still needed ?Foley:  Yes, and it is still needed ?Code Status:  full code ?Last date of multidisciplinary goals of care discussion [per primary ] ? ?Critical care time:   ? ?The patient is critically ill with multiple organ system failure and requires high complexity decision making for assessment and support, frequent evaluation and titration of therapies, advanced monitoring, review of radiographic studies and interpretation of complex data.  ? ?Critical Care Time devoted to patient care services, exclusive of separately billable procedures, described in this note is 35 minutes.  ? ?Marshell Garfinkel MD ?[facility] ?See Amion for pager ? ?If no response to pager , please call [phone] until 7pm ?After 7:00 pm call Elink  [phone] ?03/20/2022, 1:33 PM  ? ? ? ?

## 2022-03-20 NOTE — Progress Notes (Signed)
The patient and his partner requested that this nurse sit down and explain the patient's current infection status. The patient is in increasing pain/discomfort with systemic swelling and the patient/patient's partner were confused as they believed the patient was getting better based on his improving WBC (this information the partner collected from the patient's surgeon). This nurse informed the partner and patient that it was best to discuss all elements of the upcoming surgery with the patient's surgeon, but reiderated that the patient is very sick and will need all of the support from family/friends as possible. The patient informed this nurse that he would like his partner to make all medical decisions for him if he is unable to do so in the near future.  ?

## 2022-03-20 NOTE — Progress Notes (Signed)
Day of Surgery  ?Subjective: ?Having worse pain and muscle spasms in his legs, fevers.   ? ?Objective: ?Vital signs in last 24 hours: ?Temp:  [98.4 ?F (36.9 ?C)-102.4 ?F (39.1 ?C)] 99.9 ?F (37.7 ?C) (05/06 4970) ?Pulse Rate:  [84-136] 101 (05/06 0643) ?Resp:  [20-37] 34 (05/06 2637) ?BP: (87-156)/(46-82) 102/68 (05/06 8588) ?SpO2:  [93 %-100 %] 99 % (05/06 5027) ? ? ?Intake/Output from previous day: ?05/05 0701 - 05/06 0700 ?In: 4026.2 [I.V.:2829; Blood:315; IV Piggyback:882.2] ?Out: 2425 [XAJOI:7867; Stool:600] ?Intake/Output this shift: ?No intake/output data recorded. ? ? ?General appearance: alert and cooperative ?GI: soft, distended ? ? ?Lab Results:  ?Recent Labs  ?  03/19/22 ?0500 03/20/22 ?0400  ?WBC 18.2* 15.4*  ?HGB 7.2* 6.4*  ?HCT 21.5* 19.3*  ?PLT 377 400  ? ? ?BMET ?Recent Labs  ?  03/18/22 ?0617 03/19/22 ?0500  ?NA 132* 132*  ?K 4.4 3.7  ?CL 108 108  ?CO2 18* 15*  ?GLUCOSE 107* 120*  ?BUN 24* 37*  ?CREATININE 0.75 0.95  ?CALCIUM 7.6* 7.5*  ? ? ?PT/INR ?Recent Labs  ?  03/18/22 ?0617  ?LABPROT 16.9*  ?INR 1.4*  ? ? ?ABG ?No results for input(s): PHART, HCO3 in the last 72 hours. ? ?Invalid input(s): PCO2, PO2 ? ?MEDS, Scheduled ? [MAR Hold] sodium chloride   Intravenous Once  ? [MAR Hold] acetaminophen  1,000 mg Oral Q6H  ? [MAR Hold] amLODipine  5 mg Oral q AM  ? [MAR Hold] bisoprolol  10 mg Oral QHS  ? [MAR Hold] Chlorhexidine Gluconate Cloth  6 each Topical Q0600  ? [MAR Hold] ferrous sulfate  325 mg Oral TID  ? [MAR Hold] insulin aspart  0-9 Units Subcutaneous Q6H  ? [MAR Hold] lip balm  1 application. Topical BID  ? [MAR Hold] mouth rinse  15 mL Mouth Rinse BID  ? [MAR Hold] montelukast  10 mg Oral QHS  ? [MAR Hold] pantoprazole  40 mg Oral Daily  ? [MAR Hold] sodium chloride flush  10-40 mL Intracatheter Q12H  ? [MAR Hold] valACYclovir  500 mg Oral q AM  ? ? ?Studies/Results: ?CT PELVIS W CONTRAST ? ?Addendum Date: 03/19/2022   ?ADDENDUM REPORT: 03/19/2022 67:20 ADDENDUM: Dr. Leighton Ruff is  operating in the OR, per her Nurse who contacted me by telephone at 0900 hours. I related the salient findings to her Nurse, and left my cell phone number for Dr. Marcello Moores to call me and discuss further as needed. Electronically Signed   By: Genevie Ann M.D.   On: 03/19/2022 09:20  ? ?Result Date: 03/19/2022 ?CLINICAL DATA:  71 year old [name] postoperative day 4 status post debridement and drainage of anal canal necrosis, right gluteal abscess, pelvic infection. Sepsis. EXAM: CT PELVIS WITH CONTRAST TECHNIQUE: Multidetector CT imaging of the pelvis was performed using the standard protocol following the bolus administration of intravenous contrast. RADIATION DOSE REDUCTION: This exam was performed according to the departmental dose-optimization program which includes automated exposure control, adjustment of the mA and/or kV according to patient size and/or use of iterative reconstruction technique. CONTRAST:  166m OMNIPAQUE IOHEXOL 300 MG/ML  SOLN COMPARISON:  CT Abdomen and Pelvis 03/14/2022 and earlier. FINDINGS: Urinary Tract: Visible kidneys appears stable. There is mild left hydroureter surrounded by inflammation and ongoing abnormal soft tissue gas. Urinary bladder is decompressed by Foley catheter. Abundant abnormal space of Retzius gas. Bowel: Ongoing abnormal rectum with appearance suspicious for focal perforation on series 4, image 61, abundant abnormal perirectal gas and inflammation. Abundant presacral  gas and inflammation tracking cephalad into the retroperitoneum of the lower abdomen greater on the left. Will issue anal soft tissue gas right greater than left, tracking into the right inferior buttock on series 4, image 93. Associated organizing air in fluid collection situated along the left lateral conal fascia and pre renal fascia (series 4 images 1 through 14. This component of developing abscess is estimated at 40 mL volume. And additional bulky gas and fluid tracking along the left pelvic side wall  appears somewhat contiguous (series 4, image 48) and increased since 03/14/2022. Throughout the lower abdomen small and large bowel loops are fluid-filled and mildly dilated. There is a small volume of free intraperitoneal air visible at the top of the scan in the abdominal cavity on the left series 4, image 1. Vascular/Lymphatic: Grossly patent visible major arterial structures with some iliofemoral atherosclerosis. No discrete lymphadenopathy. Reproductive: Gas and fluid tracking into the left inguinal canal has increased on series 4, image 63. Urethral catheter in place. Diffuse scrotal edema on series 4, image 116. No scrotal gas. Other: No layering free fluid in the lower abdomen or pelvis. Generalized subcutaneous edema at both flanks. Musculoskeletal: Degenerative changes in the visible lower spine. No acute osseous abnormality identified. But infectious appearing soft tissue gas and stranding now tracks distally along the left sciatic neurovascular bundle into the left thigh a distance of at least 12 cm (series 4, image 108 and series 602, image 76). IMPRESSION: 1. CT appearance compatible with Progressive Necrotizing Infection affecting multiple deep spaces of the lower abdomen and pelvis, and now tracking into the left lower extremity now along the sciatic neurovascular bundle. Organizing left retroperitoneal (pararenal space, lateral conal fascia) and left pelvic sidewall Abscesses (series 4, image 14). Abundant presacral and space of Retzius gas, and suspicion of ongoing dehiscence of the left lateral rectal wall (series 4, image 61). 2. Diffuse scrotal edema.  No scrotal gas. 3. Mildly dilated small and large bowel loops throughout the lower abdomen and pelvis, suggesting ileus. Electronically Signed: By: Genevie Ann M.D. On: 03/19/2022 08:45  ? ?DG CHEST PORT 1 VIEW ? ?Result Date: 03/19/2022 ?CLINICAL DATA:  Sepsis. EXAM: PORTABLE CHEST 1 VIEW COMPARISON:  PA and lateral 01/15/2019, lung base images from  abdomen pelvis CT 03/09/2022. FINDINGS: There is mild cardiomegaly, increased central vascular prominence, evidence of early interstitial edema in the base of the lungs and small pleural effusions. There is hazy opacity in both lower lung fields which could indicate ground-glass edema, pneumonitis, atelectasis or combination. The mid and upper lung fields are clear.  There is no pneumothorax. Stable mediastinum with mild aortic tortuosity and atherosclerosis. Mild osteopenia. IMPRESSION: Cardiomegaly with mild perihilar vascular congestion, early findings of interstitial edema in the lung bases and small pleural effusions. There are hazy opacities in the lung bases which could be atelectasis, ground-glass edema, pneumonitis or combination. Clinical correlation and radiographic follow-up recommended. Electronically Signed   By: Telford Nab M.D.   On: 03/19/2022 07:39   ? ?Assessment: ?s/p  ?Patient Active Problem List  ? Diagnosis Date Noted  ? Sepsis ([place]) 03/18/2022  ? Ileus following gastrointestinal surgery ([place]) 03/18/2022  ? Normal anion gap metabolic acidosis 40/98/1191  ? Anemia 03/18/2022  ? Post-op pain 03/18/2022  ? Acute urinary retention 03/12/2022  ? 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 Mild persistent asthma 03/12/2022  ? Abdominal pain 03/09/2022  ? Rectal perforation ([place]) 03/09/2022  ? Pruritus ani 04/20/2018  ? Arthritis of both  knees 01/13/2018  ? Hx of adenomatous polyp of colon 04/07/2017  ? Perianal dermatitis 01/14/2016  ? 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/12/2013  ? History of tobacco use 11/12/2013  ? Low testosterone 09/19/2013  ? Sinusitis 12/21/2012  ? 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 Eczema 10/30/2012  ? Prolapsed internal hemorrhoids, grade 3 10/30/2012  ? Hypertension 04/13/2011  ? [place], [place] 05/14/2010  ? NEOPLASM, SKIN, UNCERTAIN BEHAVIOR 11/88/6773  ? ALLERGIC RHINITIS 08/16/2008  ? Rash and other nonspecific skin eruption 07/02/2008  ? ABNORMAL  COAGULATION PROFILE 05/29/2008  ? Gastroesophageal reflux disease 06/28/2007  ? PITYRIASIS ROSEA 06/28/2007  ? OSTEOARTHROSIS, LOCAL NOS, LOWER LEG 06/28/2007  ? Asthma, chronic 05/11/2007  ? ? ?Pelvic sepsis.  Currently d

## 2022-03-20 NOTE — Transfer of Care (Signed)
Immediate Anesthesia Transfer of Care Note ? ?Patient: Tyler [name] ? ?Procedure(s) Performed: DIAGNOSTIC LAPAROSCOPY; ABDOMINAL [place] OUT AND DRAIN PLACEMENT; CREATION OF DIVERTING COLOSTOMY; ANAL EXAM UNDER ANESTHESIA (Abdomen) ? ?Patient Location: PACU ? ?Anesthesia Type:General ? ?Level of Consciousness: awake, alert , oriented and patient cooperative ? ?Airway & Oxygen Therapy: Patient Spontanous Breathing and Patient connected to face mask oxygen ? ?Post-op Assessment: Report given to RN and Post -op Vital signs reviewed and stable ? ?Post vital signs: Reviewed and stable--pt tachypnic; will inform MDA ? ?Last Vitals:  ?Vitals Value Taken Time  ?BP 164/108 03/20/22 1054  ?Temp    ?Pulse 108 03/20/22 1058  ?Resp 37 03/20/22 1059  ?SpO2 98 % 03/20/22 1058  ?Vitals shown include unvalidated device data. ? ?Last Pain:  ?Vitals:  ? 03/20/22 0609  ?TempSrc: Oral  ?PainSc:   ?   ? ?Patients Stated Pain Goal: 3 (03/19/22 1906) ? ?Complications: No notable events documented. ?

## 2022-03-20 NOTE — Progress Notes (Signed)
PHARMACY - TOTAL PARENTERAL NUTRITION CONSULT NOTE  ? ?Indication: rectal perforation, prolonged NPO ? ?Patient Measurements: ?Height: '5\' 9"'$  (175.3 cm) ?Weight: 82.2 kg (181 lb 3.5 oz) ?IBW/kg (Calculated) : 70.7 ?TPN AdjBW (KG): 82.2 ?Body mass index is 26.76 kg/m?. ? ?Assessment: 71 yo male with history of rectal bleeding and prolapsed internal hemorrhoids who recently underwent hemorrhoid surgery w/ hemorrhoidal dearterialization on 03/05/22.  He [facility]25 with increasing abdominal pain, nausea, and fever.  CT abdomen/pelvis with thickening of rectal wall; gas and fluid within the rectal wall and in presacral space concerning for rectal perforation.  Pharmacy has been consulted to manage TPN as patient unable to tolerate PO currently and for the foreseeable future.  ? ?Glucose / Insulin: no history of DM, CBGs show 70 - 107 in previous 24 hours; no hypoglycemia ?- 0 units insulin aspart given in past 24 hours  ?- Continue q6h monitoring for hypoglycemia ?Electrolytes:  no new labs today, patient in surgery ?5/5 Na 132 (steady), Bicarb 15, K 3.7 (replaced), CorrCa 9.58 WNL, other lytes wnl ?Renal: Scr < 1, BUN 37   ?Hepatic: AST/ALT wnl, Tbili 1.5 (down), albumin 1.8, TG 142 ?Intake / Output: UOP 1825 ml/24h ?- Net I/O +1.6 L ?- MIVF: none per MD  ?GI Imaging: ?- 4/25 CTAP: rectal wall thickening w/ gas tracking into the anterior rectal wall, presacral fluid as well as stranding and gas in the mesorectal fat; consistent w/ rectal perforation ?- 4/30 CT scan: extraluminal air tracking into mesenteric space but no free peritoneal fluid   ?GI Surgeries / Procedures:  ?- 4/21 (prior to admit): hemorrhoid surgery w/ a dearterialization THD procedure ?- 5/1 right gluteal abscess with anal canal necrosis  ?- 5/6 laparoscopy diagnostic, possible ostomy, possible open ? ?Central access: PICC placed 4/28 ?TPN start date: 03/12/22 ? ?Nutritional Goals: ?Goal TPN rate is 100 mL/hr (provides 125 g of protein and  2395 kcals per day) ? ?RD Assessment:  (4/28) ?Estimated Needs ?Total Energy Estimated Needs: 2300-2500 kcal ?Total Protein Estimated Needs: 115-130 grams ?Total Fluid Estimated Needs: >/= 2 L/day ? ?5/4 Discussed with RD.  Planning to update goals to 2300-2500 kcal and 115-130 grams protein.  Updated TPN rate, protein, kcal, and elyte concentrations.   ? ? ?Current Nutrition:  ?NPO except for sips w/ meds ?TPN ? ?Plan:  ?TPN at 127m/hr (goal rate) ?Electrolytes in TPN: no changes today ?Na 90 mEq/L ?K  567m/L ?Ca 53m12mL ?Mg 8 mEq/L  ?Phos 15 mmol/L ?Cl:Ac 1:2  ?Continue standard MVI and trace elements in TPN ?Continue Sensitive SSI q6h and adjust as needed  ?Monitor TPN labs on Mon/Thurs and PRN ? ? ?MelRoyetta AsalharmD, BCPS ?Clinical Pharmacist ?[place] utilize Amion for appropriate phone number to reach the unit pharmacist ([facility] [place]5/04/2022 9:02 AM ? ? ? ? ? ?

## 2022-03-20 NOTE — Progress Notes (Signed)
Patient started receiving blood product at 0510 where the patient had a slight wheeze in the left lower lung. At 0530 the patient consistently coughed, unable to catch his breath and this nurse noted wheezes bilaterally with diminished rhonchi. The blood transfusion was stopped, IV was flushed with NS. Additionally, the patient's bed pad being recently (within the last hour) saturated with copious amounts of exudate from his wound. Dr. Harlow Asa was notified and made aware of this possible adverse reaction and wound concern. Per Dr. Harlow Asa to administer a dose of benadryl 25 mg and tylenol 1000 mg IV. Continue to monitor the patient for further adverse reactions and any bleeding. If the adverse reactions subside, restart blood and await the arrival of the OR team this AM.  ?

## 2022-03-20 NOTE — Progress Notes (Signed)
Pharmacy Antibiotic Note ? ?Tyler [name] is a 71 y.o. male admitted on 03/09/2022 with rectal perforation.  Pharmacy was consulted for Cefepime dosing on 5/3 (med active and consult was signed off).  Later on 5/3, Pharmacy was consulted for Vancomycin dosing for sepsis. ? ?Plan: ?Continue vancomycin 1 g IV q12h (eAUC 517.4 using Scr 0.95) ?Vancomycin peak and trough following 5/7 morning dose ?Continue Cefepime 2gm IV q8h ?Metronidazole per MD ?Valtrex per MD ? ?Height: '5\' 9"'$  (175.3 cm) ?Weight: 82.2 kg (181 lb 3.5 oz) ?IBW/kg (Calculated) : 70.7 ? ?Temp (24hrs), Avg:100.1 ?F (37.8 ?C), Min:98.4 ?F (36.9 ?C), Max:102.4 ?F (39.1 ?C) ? ?Recent Labs  ?Lab 03/16/22 ?0444 03/17/22 ?0313 03/17/22 ?1611 03/17/22 ?1849 03/18/22 ?2330 03/18/22 ?0617 03/18/22 ?2040 03/19/22 ?0500 03/20/22 ?0400  ?WBC  --  33.3* 28.8*  --  24.9*  --  24.3* 18.2* 15.4*  ?CREATININE 0.80 0.95 0.92  --   --  0.75  --  0.95  --   ?LATICACIDVEN  --   --  1.5 1.7  --   --   --   --   --   ? ?  ?Estimated Creatinine Clearance: 73.4 mL/min (by C-G formula based on SCr of 0.95 mg/dL).   ? ?Allergies  ?Allergen Reactions  ? Fluconazole Hives, Rash and Other (See Comments)  ?  Shingles activated ?  ? Griseofulvin Anaphylaxis, Swelling, Rash and Other (See Comments)  ?  Throat swelling ? ?  ? Penicillins Rash and Other (See Comments)  ?  High fever, tolerates Cefepime  ? Sulfa Antibiotics Other (See Comments)  ?  Joints ache, swell and caused inflammation ?  ? Mometasone Furo-Formoterol Fum Other (See Comments)  ?  Lack of therapeutic effect ?  ? Oxycodone Nausea Only and Other (See Comments)  ?  Nauseous to the point of almost vomiting  ? Retapamulin Rash  ? ? ?Antimicrobials this admission: ?4/26 Valtrex >>    ?4/30 Cefepime >>   ?4/26 Metronidazole >> ?5/3 Vancomycin >> ? ?Dose adjustments this admission: ?  ? ?Microbiology results: ?5/3 MRSA PCR:  not detected ?5/3 Bcx: ngtd ? ?Thank you for allowing pharmacy to be a part of this patient?s  care. ? ?Royetta Asal, PharmD, BCPS ?Clinical Pharmacist ?[place] ?Please utilize Amion for appropriate phone number to reach the unit pharmacist ([place]) ?03/20/2022 9:18 AM ? ? ?

## 2022-03-20 NOTE — Op Note (Signed)
03/09/2022 - 03/20/2022 ? ?10:31 AM ? ?PATIENT:  Tyler [name]  71 y.o. male ? ?Patient Care Team: ?Cari Caraway, MD as PCP - General (Family Medicine) ? ?PRE-OPERATIVE DIAGNOSIS:  NA ? ?POST-OPERATIVE DIAGNOSIS:  * No post-op diagnosis entered * ? ?PROCEDURE:  DIAGNOSTIC LAPAROSCOPY; ABDOMINAL [place] OUT AND DRAIN PLACEMENT; CREATION OF DIVERTING COLOSTOMY; ANAL EXAM UNDER ANESTHESIA ? ?  Surgeon(s): ?Leighton Ruff, MD ?Armandina Gemma, MD ? ?ASSISTANT: Dr Harlow Asa  ? ?ANESTHESIA:   general ? ?EBL: 213m Total I/O ?In: 15643[I.V.:700; Blood:315; IV Piggyback:460] ?Out: 400 [Urine:150; Blood:250] ? ?DRAINS: Penrose drain in the perianal space and (24F) Jackson-Pratt drain(s) with closed bulb suction in the presacral space, L pericolic gutter and subfascial space   ? ?SPECIMEN:  No Specimen ? ?DISPOSITION OF SPECIMEN:  N/A ? ?COUNTS:  YES ? ?PLAN OF CARE:  Pt already admitted ? ?PATIENT DISPOSITION:  PACU - guarded condition. ? ?INDICATION: 71year old male status post hemorrhoidopexy with necrosis of the anal canal.  Patient became hemodynamically unstable and a diverting colostomy and abdominal washout was recommended.  Consent was signed and placed on chart prior to the procedure. ? ?OR FINDINGS: extraperitoneal infection tracking into presacral space, L pericolic gutter and infrapubic space ? ?DESCRIPTION: the patient was identified in the preoperative holding area and taken to the OR where they were laid supine on the operating room table.  General anesthesia was induced without difficulty. SCDs were also noted to be in place prior to the initiation of anesthesia.  The patient was then prepped and draped in the usual sterile fashion. ?  A surgical timeout was performed indicating the correct patient, procedure, positioning and need for preoperative antibiotics.  I began by going through his previous infraumbilical incision using a 15 blade scalpel.  Dissection was carried down to level the fascia.  Fascia was  elevated and the peritoneum was entered bluntly.  A 0 Vicryl stay suture was placed and the HSheryle Hailwas placed into this.  The abdomen was insufflated to approximately 15 mmHg.  A camera was inserted into the abdomen.  There was obvious preperitoneal inflammation anteriorly.  The sigmoid colon was adherent to the left paracolic gutter.  There was purulent ascites and evidence of infection within the pelvis.  I then placed 2 5 mm ports in the right upper and right lower quadrants.  I evaluated the pelvis.  The infection appeared to be stemming from the presacral space.  I decided to mobilize the sigmoid colon for diverting ostomy.  This was done using laparoscopic scissors and blunt dissection.  There was obvious inflammation and infection noted along the left paracolic gutter.  This was irrigated and debrided.  The sigmoid colon was mobilized laterally up to the level of the splenic flexure.  Hemostasis was good. ? ?At this point the insufflation was removed and we switched to a open procedure.  A Pfannenstiel incision was made.  There was obvious infection posterior to the fascia anteriorly that was irrigated and drained.  The peritoneum was then entered bluntly.  An [place] Head Island[place]ound protector was placed.  I began by dissecting into the preperitoneal space bluntly.  This was irrigated and drained.  The abdominal cavity was irrigated with approximately 3 to 4 L of warm normal saline.  I made a defect in the abdominal wall for an ostomy.  This was carried down to the level of the fascia.  The fascia was incised in a cruciate manner.  The rectus muscle was split and the peritoneum  was entered.  I divided the sigmoid colon using a 75 mm GIA stapler.  The proximal and was brought up through the ostomy site.  The distal edge was also brought up through the medial portion of the ostomy site.  This was then matured in standard [place] fashion using interrupted 2-0 Vicryl sutures.  The corner of the distal portion of the sigmoid  colon was excised and sutured to the dermis of the medial portion of the ostomy wound.  We then irrigated the distal rectum with several liters of warm normal saline.  I went down below and inspected the anal canal.  There was significant posterior necrosis of the left posterior lateral anal canal.  There was a 1 cm defect in the rectum at the anorectal junction.  I placed a Penrose drain into the space behind this and brought it out through the previously incised right gluteal incision.  This was secured with a 2-0 nylon suture. ? ?Once this was completed we continue to washout the abdomen.  There was significant contamination in the retroperitoneal space.  A 19 [place] Blake drain was placed in the left paracolic gutter and retroperitoneal space and brought out through the left lower quadrant abdominal wall and secured into place with a nylon suture.  Another 57 [place] Blake drain was placed into the preperitoneal space and brought out through the right lower quadrant abdominal wall and secured with a nylon suture.  The abdomen was irrigated once again.  We ran the small bowel and saw no evidence of injury.  We inspected the sigmoid colon proximally and distally.  There were no defects noted.  The colon appeared well perfused.  At this point the wound protector was removed and the peritoneum of the Pfannenstiel incision was closed with a 0 Vicryl suture.  A 19 [place] Blake drain was placed into the anterior preperitoneal space and brought out through the right lower quadrant abdominal wall.  This was secured into place with a nylon suture.  The fascia was then closed using running #1 PDS sutures x2.  The subcutaneous tissue was left open and a wound VAC was placed over this.  Ostomy appliance and drain sponges were then placed.  The remaining port sites were closed using interrupted 4-0 Vicryl sutures.  Sterile strips were placed over these.  The patient was then awakened from anesthesia and sent to the  postanesthesia care unit in stable condition.  All counts were correct per operating room staff. ? ?Rosario Adie, MD ? ?Colorectal and General Surgery ?[facility] ? ?

## 2022-03-21 DIAGNOSIS — K631 Perforation of intestine (nontraumatic): Secondary | ICD-10-CM | POA: Diagnosis not present

## 2022-03-21 LAB — CBC
HCT: 23 % — ABNORMAL LOW (ref 39.0–52.0)
Hemoglobin: 7.9 g/dL — ABNORMAL LOW (ref 13.0–17.0)
MCH: 34.5 pg — ABNORMAL HIGH (ref 26.0–34.0)
MCHC: 34.3 g/dL (ref 30.0–36.0)
MCV: 100.4 fL — ABNORMAL HIGH (ref 80.0–100.0)
Platelets: 366 10*3/uL (ref 150–400)
RBC: 2.29 MIL/uL — ABNORMAL LOW (ref 4.22–5.81)
RDW: 16.8 % — ABNORMAL HIGH (ref 11.5–15.5)
WBC: 11.8 10*3/uL — ABNORMAL HIGH (ref 4.0–10.5)
nRBC: 0 % (ref 0.0–0.2)

## 2022-03-21 LAB — GLUCOSE, CAPILLARY
Glucose-Capillary: 114 mg/dL — ABNORMAL HIGH (ref 70–99)
Glucose-Capillary: 119 mg/dL — ABNORMAL HIGH (ref 70–99)
Glucose-Capillary: 122 mg/dL — ABNORMAL HIGH (ref 70–99)
Glucose-Capillary: 126 mg/dL — ABNORMAL HIGH (ref 70–99)
Glucose-Capillary: 127 mg/dL — ABNORMAL HIGH (ref 70–99)
Glucose-Capillary: 138 mg/dL — ABNORMAL HIGH (ref 70–99)
Glucose-Capillary: 152 mg/dL — ABNORMAL HIGH (ref 70–99)

## 2022-03-21 LAB — BASIC METABOLIC PANEL
Anion gap: 5 (ref 5–15)
BUN: 45 mg/dL — ABNORMAL HIGH (ref 8–23)
CO2: 17 mmol/L — ABNORMAL LOW (ref 22–32)
Calcium: 7.2 mg/dL — ABNORMAL LOW (ref 8.9–10.3)
Chloride: 115 mmol/L — ABNORMAL HIGH (ref 98–111)
Creatinine, Ser: 1.09 mg/dL (ref 0.61–1.24)
GFR, Estimated: >60 mL/min (ref >60)
Glucose, Bld: 130 mg/dL — ABNORMAL HIGH (ref 70–99)
Potassium: 3.9 mmol/L (ref 3.5–5.1)
Sodium: 137 mmol/L (ref 135–145)

## 2022-03-21 LAB — VANCOMYCIN, TROUGH: Vancomycin Tr: 28 ug/mL (ref 15–20)

## 2022-03-21 LAB — VANCOMYCIN, PEAK: Vancomycin Pk: 47 ug/mL — ABNORMAL HIGH (ref 30–40)

## 2022-03-21 LAB — LACTIC ACID, PLASMA: Lactic Acid, Venous: 2.1 mmol/L (ref 0.5–1.9)

## 2022-03-21 MED ORDER — NALOXONE HCL 0.4 MG/ML IJ SOLN: 0.4000 mg | INTRAMUSCULAR | Status: DC | PRN

## 2022-03-21 MED ORDER — HYDROMORPHONE HCL 1 MG/ML IJ SOLN
0.5000 mg | Freq: Once | INTRAMUSCULAR | Status: AC
  Administered 2022-03-21: 0.5 mg via INTRAVENOUS
  Filled 2022-03-21: qty 1

## 2022-03-21 MED ORDER — TRAVASOL 10 % IV SOLN
INTRAVENOUS | Status: AC
  Filled 2022-03-21: qty 1248

## 2022-03-21 MED ORDER — HYDROMORPHONE 1 MG/ML IV SOLN
INTRAVENOUS | Status: DC
  Administered 2022-03-21: 3.5 mg via INTRAVENOUS
  Administered 2022-03-21: 0.9 mg via INTRAVENOUS
  Administered 2022-03-21: 0.5 mg via INTRAVENOUS
  Administered 2022-03-22: 0.9 mg via INTRAVENOUS
  Administered 2022-03-22: 0.6 mg via INTRAVENOUS
  Administered 2022-03-22: 2.1 mg via INTRAVENOUS
  Administered 2022-03-22: 1.2 mg via INTRAVENOUS
  Administered 2022-03-23: 0.6 mg via INTRAVENOUS
  Administered 2022-03-23: 0.3 mg via INTRAVENOUS
  Administered 2022-03-24: 0.9 mg via INTRAVENOUS
  Administered 2022-03-24: 0.3 mg via INTRAVENOUS
  Administered 2022-03-25: 30 mg via INTRAVENOUS
  Administered 2022-03-25: 0.6 mg via INTRAVENOUS
  Administered 2022-03-25: 30 mg via INTRAVENOUS
  Administered 2022-03-27 – 2022-03-28 (×5): 0.3 mg via INTRAVENOUS
  Administered 2022-03-28: 0.9 mg via INTRAVENOUS
  Administered 2022-03-29: 0.3 mg via INTRAVENOUS
  Administered 2022-03-29 – 2022-03-30 (×3): 0.6 mg via INTRAVENOUS
  Administered 2022-03-30 – 2022-03-31 (×4): 0.3 mg via INTRAVENOUS
  Administered 2022-03-31: 0.6 mg via INTRAVENOUS
  Administered 2022-03-31 (×2): 0.3 mg via INTRAVENOUS
  Administered 2022-03-31: 0.9 mg via INTRAVENOUS
  Administered 2022-04-01 – 2022-04-02 (×4): 0.3 mg via INTRAVENOUS
  Filled 2022-03-21 (×2): qty 30

## 2022-03-21 MED ORDER — HYDROMORPHONE HCL 1 MG/ML IJ SOLN
1.0000 mg | Freq: Once | INTRAMUSCULAR | Status: AC
  Administered 2022-03-21: 1 mg via INTRAVENOUS
  Filled 2022-03-21: qty 1

## 2022-03-21 MED ORDER — METHOCARBAMOL 1000 MG/10ML IJ SOLN
500.0000 mg | Freq: Four times a day (QID) | INTRAVENOUS | Status: DC | PRN
  Administered 2022-03-21 – 2022-04-02 (×10): 500 mg via INTRAVENOUS
  Filled 2022-03-21 (×7): qty 500
  Filled 2022-03-21: qty 5
  Filled 2022-03-21: qty 500
  Filled 2022-03-21: qty 5
  Filled 2022-03-21 (×2): qty 500
  Filled 2022-03-21 (×2): qty 5

## 2022-03-21 MED ORDER — VANCOMYCIN HCL IN DEXTROSE 1-5 GM/200ML-% IV SOLN: 1000.0000 mg | INTRAVENOUS | Status: DC

## 2022-03-21 MED ORDER — SODIUM CHLORIDE 0.9% FLUSH
9.0000 mL | INTRAVENOUS | Status: DC | PRN
  Administered 2022-03-21: 9 mL via INTRAVENOUS

## 2022-03-21 MED ORDER — ACETAMINOPHEN 10 MG/ML IV SOLN
1000.0000 mg | Freq: Three times a day (TID) | INTRAVENOUS | Status: AC | PRN
  Administered 2022-03-22 (×2): 1000 mg via INTRAVENOUS
  Filled 2022-03-21 (×2): qty 100

## 2022-03-21 NOTE — Progress Notes (Signed)
? ?NAME:  Tyler [name], MRN:  [id number], DOB:  Nov 21, 1950, LOS: 39 ?ADMISSION DATE:  03/09/2022, CONSULTATION DATE:  03/21/22 ?REFERRING MD:  Marcello Moores, CHIEF COMPLAINT:  sepsis  ? ?History of Present Illness:  ?Tyler [name] is a 71 y.o. M with PMH of Asthma, GERD and HTN who underwent a routine transanal hemorrhoidal dearterialization on 4/21.   He experienced worsening abdominal pain, nausea and fever, so presented to the ED on 4/25.   CT scan of the abdomen and pelvis was performed.  This showed thickening of the rectal wall.  He was admitted by surgery and treated conservatively with Cefepime and Flagyl, however continued to have pain and fever, and repeat CT with possible perforation, so was taken to the OR for I&D of the anal canal on 5/1 there was necrosis of the L posterior anal canal which was debrided.   He initially felt better, but over the last 48 hours has continued to be febrile with WBC increasing to 33k.   He reports continued lower abdominal pain and nausea.  BP has been stable, though HR increased to 120-130.  He has been passing gas and blood per rectum and making urine.  Pt was transferred to stepdown and PCCM consulted ? ?Pertinent  Medical History  ? 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 ? ? ?Significant Hospital Events: ?Including procedures, antibiotic start and stop dates in addition to other pertinent events   ?4/21 hemorrhoidal dearterialization ?4/25 admitted to surgery with fever and  pain, thickening of rectal wall  ?5/1 Taken to OR for anal canal I&D and rectal wall necrosis debridement ?5/3 signs of worsening sepsis, transfer to progressive and PCCM consult ?CT abd/pelvis 4/25 Rectal wall thickening with gas tracking into the anterior rectal ?wall, presacral fluid as well as stranding and gas in the mesorectal fat, findings most consistent with rectal perforation. CT abd/pelvis 4/30 1. Significant increase in extraluminal extraperitoneal gas in the abdomen and pelvis from CT 5 days ago as described above. Source of air likely represents rectal perforation. There is generalized increase in stranding and ill-defined fluid associated with this extraluminal air. No peripherally enhancing fluid collection. 2. Left inguinal hernia contains tracking air and small amount of non organized free fluid. ?5/4 HR better end organ fxn stable ?5/6 Has worsening infection on CT scan and patient taken back to OR for diverting colostomy and abd wash out.  PCCM reconsulted for hypotension ? ? ?Interim History / Subjective:  ? ? ? ?Objective   ?Blood pressure (!) 142/64, pulse (!) 101, temperature 98.5 ?F (36.9 ?C), temperature source Oral, resp. rate (!) 22, height '5\' 9"'$  (1.753 m), weight 82.2 kg, SpO2 99 %. ?CVP:  [6 mmHg-7 mmHg] 7 mmHg  ?   ? ?Intake/Output Summary (Last 24 hours) at 03/21/2022 0729 ?Last data filed at 03/21/2022 7062 ?Gross per 24 hour  ?Intake 8936.96 ml  ?Output 2789 ml  ?Net 6147.96 ml  ? ?Filed Weights  ? 03/09/22 1410 03/18/22 0800  ?Weight: 77.2 kg 82.2 kg  ? ?Blood pressure (!) 93/54, pulse 95, temperature 99.4 ?F (37.4 ?C), temperature source Axillary, resp. rate (!) 26, height  $'5\' 9"'O$  (1.753 m), weight 82.2 kg, SpO2 95 %. ?Gen:      No acute distress ?HEENT:  EOMI, sclera anicteric ?Neck:     No masses; no thyromegaly ?Lungs:    Clear to auscultation bilaterally; normal respiratory effort ?CV:         Regular rate and rhythm; no murmurs ?Abd:      + bowel sounds; soft, non-tender; no  palpable masses, no distension ?Ext:    No edema; adequate peripheral perfusion ?Skin:      Warm and dry; no rash ?Neuro: alert and oriented x 3 ?Psych: normal mood and affect  ? ?Labs/imaging reviewed with ?Creatinine 1.31, lactic acid 2.3, WBC 11.8, hemoglobin 7.9 ?No new imaging ? ?Resolved Hospital Problem list   ? ? ?Assessment & Plan:  ? ?Principal Problem: ?  Rectal perforation ([place]) ?Active Problems: ?  Abdominal pain ?  Acute urinary retention ?  Sepsis ([place]) ?  Ileus following gastrointestinal surgery (HCC) ?  Normal anion gap metabolic acidosis ?  Anemia ?  Post-op pain ? ? ?Sepsis from anal abscess  c/ b Necrotic anal wall  ?S/p I&D and debridement on 5/1  ?Diverting ostomy, washout today on 5/6,  ?On broad antibiotic coverage with vancomycin, Flagyl, cefepime ?Continue IV fluid resuscitation.  Monitor elevated lactic acid ?Consider PCA pump as he is in severe pain ? ?Ileus exacerbated by narcotics ?Plan ?Monitor ? ?NAGMA in context of hyperchloremia  ?Plan ?Follow labs ? ?Anemia ?S/p 2 units PRBCs in OR 5/ ?Plan ?Trend cbc ? ?AKI secondary to ATN in setting of sepsis ?Gentle hydration.  Follow labs ? ?Best Practice (right click and "Reselect all SmartList Selections" daily)  ? ?Diet/type: TPN ?DVT prophylaxis: SCD ?GI prophylaxis: N/A ?Lines: yes and it is still needed ?Foley:  Yes, and it is still needed ?Code Status:  full code ?Last date of multidisciplinary goals of care discussion [per primary ] ? ?Critical care time:   ? ?The patient is critically ill with multiple organ system failure and requires high complexity decision making for assessment and support, frequent evaluation and titration of therapies, advanced monitoring, review of radiographic studies and interpretation of complex data.  ? ?Critical Care Time devoted to patient care services, exclusive of separately billable procedures, described in this note is 35 minutes.  ? ?Marshell Garfinkel MD ?[facility] ?See Amion  for pager ? ?If no response to pager , please call [phone] until 7pm ?After 7:00 pm call Elink  [phone] ?03/21/2022, 7:35 AM  ? ? ?

## 2022-03-21 NOTE — Progress Notes (Signed)
eLink Physician-Brief Progress Note ?Patient Name: Tyler [name] ?DOB: 1951-02-24 ?MRN: [id number] ? ? ?Date of Service ? 03/21/2022  ?HPI/Events of Note ? Patient c/o abdominal pain. Nursing concerned about increasing swelling in his abdomen and scrotum over night.   ?eICU Interventions ? Plan: ?Dilaudid 0.5 mg IV now (extra dose). ?Defer further management of pain and abdominal to surgical service. Bedside nurse to contact surgery.   ? ? ? ?Intervention Category ?Major Interventions: Other: ? ?Merle Cirelli Cornelia Copa ?03/21/2022, 6:04 AM ?

## 2022-03-21 NOTE — Progress Notes (Signed)
PHARMACY - TOTAL PARENTERAL NUTRITION CONSULT NOTE  ? ?Indication: rectal perforation, prolonged NPO ? ?Patient Measurements: ?Height: '5\' 9"'$  (175.3 cm) ?Weight: 82.2 kg (181 lb 3.5 oz) ?IBW/kg (Calculated) : 70.7 ?TPN AdjBW (KG): 82.2 ?Body mass index is 26.76 kg/m?. ? ?Assessment: 71 yo male with history of rectal bleeding and prolapsed internal hemorrhoids who recently underwent hemorrhoid surgery w/ hemorrhoidal dearterialization on 03/05/22.  He presented to [facility] on 4/25 with increasing abdominal pain, nausea, and fever.  CT abdomen/pelvis with thickening of rectal wall; gas and fluid within the rectal wall and in presacral space concerning for rectal perforation.  Pharmacy has been consulted to manage TPN as patient unable to tolerate PO currently and for the foreseeable future.  ? ?Glucose / Insulin: no history of DM, CBGs show 70 - 107 in previous 24 hours; no hypoglycemia ?- 0 units insulin aspart given in past 24 hours  ?- Continue q6h monitoring for hypoglycemia ?Electrolytes:  no new labs today, BMET d/c'd by MD ?5/6 WNL except chloride 113, bicarb 14 ?Renal: Scr up 1.31 and BUN yesterday, repeat CMP in am  ?Hepatic: AST/ALT wnl, Tbili 1.5 (down), albumin 1.8, TG 142 ?Intake / Output: UOP 2135 ml/24h ?- Net I/O +5.6 L ?- MIVF: NS KVO ?GI Imaging: ?- 4/25 CTAP: rectal wall thickening w/ gas tracking into the anterior rectal wall, presacral fluid as well as stranding and gas in the mesorectal fat; consistent w/ rectal perforation ?- 4/30 CT scan: extraluminal air tracking into mesenteric space but no free peritoneal fluid   ?GI Surgeries / Procedures:  ?- 4/21 (prior to admit): hemorrhoid surgery w/ a dearterialization THD procedure ?- 5/1 right gluteal abscess with anal canal necrosis  ?- 5/6 ex lap, ostomy creation and drain placement ? ?Central access: PICC placed 4/28 ?TPN start date: 03/12/22 ? ?Nutritional Goals: ?Goal TPN rate is 100 mL/hr (provides 125 g of protein and 2395 kcals per day) ? ?RD  Assessment:  (5/4) ?Estimated Needs ?Total Energy Estimated Needs: 2300-2500 kcal ?Total Protein Estimated Needs: 115-130 grams ?Total Fluid Estimated Needs: >/= 2 L/day ? ? ?Current Nutrition:  ?NPO except for sips w/ meds ?TPN ? ?Plan:  ?TPN at 152m/hr (goal rate) ?Electrolytes in TPN: no changes today ?Na 90 mEq/L ?K  5561m/L ?Ca 61m50mL ?Mg 8 mEq/L  ?Phos 15 mmol/L ?Cl:Ac 1:2  ?Continue standard MVI and trace elements in TPN ?Continue Sensitive SSI q6h and adjust as needed  ?Monitor TPN labs on Mon/Thurs and PRN ? ? ?MelRoyetta AsalharmD, BCPS ?Clinical Pharmacist ?[facility] utilize Amion for appropriate phone number to reach the unit pharmacist ([facility]5/05/2022 9:50 AM ? ? ? ? ? ?

## 2022-03-21 NOTE — Progress Notes (Signed)
Pharmacy Antibiotic Note ? ?Tyler [name] is a 71 y.o. male with hx rectal bleeding and prolapsed internal hemorrhoids who underwent transanal hemorrhoidal dearterialization on 03/05/22.  He presented to the ED on 03/09/2022 with c/o fever, abdominal pain and n/v. Abd CT on 03/09/22 showed findings with concern for rectal perforation. He was started on cefepime and flagyl on admission and underwent I&D of anal anal on 03/15/22.  He had fevers and became tachycardic on 03/17/22 with vancomycin added to abx regimen. On 03/20/22, he underwent abdominal washout with drain placement and creation of diverting colostomy. ? ? ?Today, 03/21/2022: ?- Tmax 101.3, wbc 11.8 ?- scr down 1.09 (crcl~64) ?- vancomycin levels collected today:  ?  Vanc pk= 47, VT = 28 with dose 1000 mg q12h; Calc AUC elevated at 898 (goal 400-550)   ? ?Plan: ?- change vancomycin to 1000 mg IV q24h for calc AUC 449 ?- CCS note on 5/7 recom to d/c vancomycin if bcx remain negative at day #5 ?- cont cefepime 2gm q8h ?- flagyl 500 mg IV q12h per MD ? ?__________________________________ ? ?Height: '5\' 9"'$  (175.3 cm) ?Weight: 82.2 kg (181 lb 3.5 oz) ?IBW/kg (Calculated) : 70.7 ? ?Temp (24hrs), Avg:99.7 ?F (37.6 ?C), Min:98.5 ?F (36.9 ?C), Max:101.3 ?F (38.5 ?C) ? ?Recent Labs  ?Lab 03/17/22 ?1611 03/17/22 ?1849 03/18/22 ?2353 03/18/22 ?0617 03/18/22 ?2040 03/19/22 ?0500 03/20/22 ?0400 03/20/22 ?1318 03/20/22 ?1339 03/20/22 ?1612 03/20/22 ?2006 03/20/22 ?2342 03/21/22 ?6144 03/21/22 ?3154 03/21/22 ?1548  ?WBC 28.8*  --    < >  --  24.3* 18.2* 15.4* 14.1*  --   --   --   --  11.8*  --   --   ?CREATININE 0.92  --   --  0.75  --  0.95  --  1.31*  --   --   --   --   --  1.09  --   ?LATICACIDVEN 1.5 1.7  --   --   --   --   --   --  2.5* 2.9* 2.3* 2.1*  --   --   --   ?[place]  --   --   --   --   --   --   --   --   --   --   --   --   --   --  28*  ?VANCOPEAK  --   --   --   --   --   --   --   --   --   --   --   --  47*  --   --   ? < > = values in this interval  not displayed.  ? ?  ?Estimated Creatinine Clearance: 64 mL/min (by C-G formula based on SCr of 1.09 mg/dL).   ? ?Allergies  ?Allergen Reactions  ? Fluconazole Hives, Rash and Other (See Comments)  ?  Shingles activated ?  ? Griseofulvin Anaphylaxis, Swelling, Rash and Other (See Comments)  ?  Throat swelling ? ?  ? Penicillins Rash and Other (See Comments)  ?  High fever, tolerates Cefepime  ? Sulfa Antibiotics Other (See Comments)  ?  Joints ache, swell and caused inflammation ?  ? Mometasone Furo-Formoterol Fum Other (See Comments)  ?  Lack of therapeutic effect ?  ? Oxycodone Nausea Only and Other (See Comments)  ?  Nauseous to the point of almost vomiting  ? Retapamulin Rash  ? ?4/25 cefepime>> ?4/25 flagyl>>   ?5/3  vanc >> ? ?4/25 Resp: neg ?5/1 MRSA PCR: neg ?5/3 MRSA PCR: not detected ?5/3 BCx: ngtd ?5/6 soft tissue: few GNR, few enterococcus, adundant GPC ? ? ?Thank you for allowing pharmacy to be a part of this patient?s care. ? ?Lynelle Doctor ?03/21/2022 5:15 PM ? ?

## 2022-03-21 NOTE — Progress Notes (Signed)
Notified Pharmacist, Renato Gails in regards to patient's vancomycin trough being 28. Will continue to monitor and assess.  ?

## 2022-03-21 NOTE — Progress Notes (Signed)
1 Day Post-Op ex lap, ostomy creation and drain placement ?Subjective: ?Mainly complains of pain.  Febrile yesterday after surgery.  Good UOP, Min NG output, metal status improved, lactic acid trending down ? ?Objective: ?Vital signs in last 24 hours: ?Temp:  [98.4 ?F (36.9 ?C)-101.3 ?F (38.5 ?C)] 98.5 ?F (36.9 ?C) (05/07 0400) ?Pulse Rate:  [73-108] 101 (05/07 0600) ?Resp:  [20-37] 22 (05/07 0600) ?BP: (86-164)/(37-108) 142/64 (05/07 0600) ?SpO2:  [91 %-100 %] 99 % (05/07 0600) ? ? ?Intake/Output from previous day: ?05/06 0701 - 05/07 0700 ?In: 5320 [I.V.:5807.1; Blood:315; NG/GT:30; IV Piggyback:2784.9] ?Out: 2789 [EBXID:5686; HUOHFG:902; Blood:250] ?Intake/Output this shift: ?No intake/output data recorded. ? ? ?General appearance: alert and cooperative ?GI: soft, distended, appropriately TTP ?Pelvis JP with brown fluid ?Sub Q and RP drains with SSF ?Wound vac in place ?NG in place ?Colostomy viable with clear drainage and air ? ?Lab Results:  ?Recent Labs  ?  03/20/22 ?1318 03/21/22 ?0543  ?WBC 14.1* 11.8*  ?HGB 8.9* 7.9*  ?HCT 25.6* 23.0*  ?PLT 377 366  ? ? ?BMET ?Recent Labs  ?  03/19/22 ?0500 03/20/22 ?1318  ?NA 132* 135  ?K 3.7 4.2  ?CL 108 113*  ?CO2 15* 14*  ?GLUCOSE 120* 117*  ?BUN 37* 45*  ?CREATININE 0.95 1.31*  ?CALCIUM 7.5* 7.2*  ? ? ?PT/INR ?No results for input(s): LABPROT, INR in the last 72 hours. ? ?ABG ?No results for input(s): PHART, HCO3 in the last 72 hours. ? ?Invalid input(s): PCO2, PO2 ? ?MEDS, Scheduled ? sodium chloride   Intravenous Once  ? Chlorhexidine Gluconate Cloth  6 each Topical Q0600  ? ferrous sulfate  325 mg Oral TID  ? insulin aspart  0-9 Units Subcutaneous Q4H  ? lip balm  1 application. Topical BID  ? mouth rinse  15 mL Mouth Rinse BID  ? pantoprazole  40 mg Oral Daily  ? sodium chloride flush  10-40 mL Intracatheter Q12H  ? ? ?Studies/Results: ?No results found. ? ?Assessment: ?s/p  ?Patient Active Problem List  ? Diagnosis Date Noted  ? Sepsis ([place]) 03/18/2022  ? Ileus  following gastrointestinal surgery ([place]) 03/18/2022  ? Normal anion gap metabolic acidosis 09/30/5207  ? Anemia 03/18/2022  ? Post-op pain 03/18/2022  ? Acute urinary retention 03/12/2022  ? 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 Mild persistent asthma 03/12/2022  ? Abdominal pain 03/09/2022  ? Rectal perforation ([place]) 03/09/2022  ? Pruritus ani 04/20/2018  ? Arthritis of both knees 01/13/2018  ? Hx of adenomatous polyp of colon 04/07/2017  ? Perianal dermatitis 01/14/2016  ? 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/12/2013  ? History of tobacco use 11/12/2013  ? Low testosterone 09/19/2013  ? Sinusitis 12/21/2012  ? 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 Eczema 10/30/2012  ? Prolapsed internal hemorrhoids, grade 3 10/30/2012  ? Hypertension 04/13/2011  ? [place], [place] 05/14/2010  ? NEOPLASM, SKIN, UNCERTAIN BEHAVIOR 01/07/3611  ? ALLERGIC RHINITIS 08/16/2008  ? Rash and other nonspecific skin eruption 07/02/2008  ? ABNORMAL COAGULATION PROFILE 05/29/2008  ? Gastroesophageal reflux disease 06/28/2007  ? PITYRIASIS [place] 06/28/2007  ? OSTEOARTHROSIS, LOCAL NOS, LOWER LEG 06/28/2007  ? Asthma, chronic 05/11/2007  ? ? ?Pelvic sepsis.  Currently drained and debrided.   ? 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 ?CT 5/5 shows significant retroperitoneal contamination.   ? ?OR findings 5/6: significant RP infection, drained via L pericolic gutter, presacral infection, drained, anterior abd wall  infection, drained. ? ? ?Plan: ?Cont NPO and bowel rest for now ?Cont TPN ? ?Cont IV antibiotics, Flagyl/Vanc added to regimen 5/3.  Blood cultures NGTD.  Can d/c Vanc if blood cultures remain neg at day 5, Recheck cbc in AM ? ?SCD's ?Dressing changes q shift ?dilaudid PCA for pain control. ?Hold PO meds for now ?Incentive spirometer Q1h while awake ? ? LOS: 11 days  ? ? ? ?Marland KitchenRosario Adie, MD ?[facility], [place] ? ? ? ?03/21/2022 ?7:53  AM ? ? ? ?  ?

## 2022-03-22 ENCOUNTER — Encounter (HOSPITAL_COMMUNITY): Payer: Self-pay | Admitting: General Surgery

## 2022-03-22 DIAGNOSIS — K631 Perforation of intestine (nontraumatic): Secondary | ICD-10-CM | POA: Diagnosis not present

## 2022-03-22 LAB — COMPREHENSIVE METABOLIC PANEL
ALT: 20 U/L (ref 0–44)
AST: 21 U/L (ref 15–41)
Albumin: <1.5 g/dL — ABNORMAL LOW (ref 3.5–5.0)
Alkaline Phosphatase: 104 U/L (ref 38–126)
Anion gap: 6 (ref 5–15)
BUN: 44 mg/dL — ABNORMAL HIGH (ref 8–23)
CO2: 18 mmol/L — ABNORMAL LOW (ref 22–32)
Calcium: 7.5 mg/dL — ABNORMAL LOW (ref 8.9–10.3)
Chloride: 116 mmol/L — ABNORMAL HIGH (ref 98–111)
Creatinine, Ser: 1 mg/dL (ref 0.61–1.24)
GFR, Estimated: >60 mL/min (ref >60)
Glucose, Bld: 125 mg/dL — ABNORMAL HIGH (ref 70–99)
Potassium: 4 mmol/L (ref 3.5–5.1)
Sodium: 140 mmol/L (ref 135–145)
Total Bilirubin: 1 mg/dL (ref 0.3–1.2)
Total Protein: 4.8 g/dL — ABNORMAL LOW (ref 6.5–8.1)

## 2022-03-22 LAB — CBC
HCT: 24.3 % — ABNORMAL LOW (ref 39.0–52.0)
Hemoglobin: 7.9 g/dL — ABNORMAL LOW (ref 13.0–17.0)
MCH: 32.2 pg (ref 26.0–34.0)
MCHC: 32.5 g/dL (ref 30.0–36.0)
MCV: 99.2 fL (ref 80.0–100.0)
Platelets: 429 10*3/uL — ABNORMAL HIGH (ref 150–400)
RBC: 2.45 MIL/uL — ABNORMAL LOW (ref 4.22–5.81)
RDW: 15.9 % — ABNORMAL HIGH (ref 11.5–15.5)
WBC: 13.9 10*3/uL — ABNORMAL HIGH (ref 4.0–10.5)
nRBC: 0 % (ref 0.0–0.2)

## 2022-03-22 LAB — BPAM RBC
Blood Product Expiration Date: 202305312359
Blood Product Expiration Date: 202305312359
ISSUE DATE / TIME: 202305060450
ISSUE DATE / TIME: 202305060803
Unit Type and Rh: 5100
Unit Type and Rh: 5100

## 2022-03-22 LAB — GLUCOSE, CAPILLARY
Glucose-Capillary: 113 mg/dL — ABNORMAL HIGH (ref 70–99)
Glucose-Capillary: 131 mg/dL — ABNORMAL HIGH (ref 70–99)
Glucose-Capillary: 106 mg/dL — ABNORMAL HIGH (ref 70–99)
Glucose-Capillary: 127 mg/dL — ABNORMAL HIGH (ref 70–99)
Glucose-Capillary: 112 mg/dL — ABNORMAL HIGH (ref 70–99)
Glucose-Capillary: 129 mg/dL — ABNORMAL HIGH (ref 70–99)
Glucose-Capillary: 118 mg/dL — ABNORMAL HIGH (ref 70–99)

## 2022-03-22 LAB — TYPE AND SCREEN
ABO/RH(D): O POS
Antibody Screen: NEGATIVE
Unit division: 0
Unit division: 0

## 2022-03-22 LAB — TRIGLYCERIDES: Triglycerides: 116 mg/dL (ref <150)

## 2022-03-22 LAB — CULTURE, BLOOD (ROUTINE X 2)
Culture: NO GROWTH
Culture: NO GROWTH
Special Requests: ADEQUATE

## 2022-03-22 LAB — MAGNESIUM: Magnesium: 2 mg/dL (ref 1.7–2.4)

## 2022-03-22 LAB — PHOSPHORUS: Phosphorus: 2.5 mg/dL (ref 2.5–4.6)

## 2022-03-22 MED ORDER — FUROSEMIDE 10 MG/ML IJ SOLN
40.0000 mg | Freq: Once | INTRAMUSCULAR | Status: AC
  Administered 2022-03-22: 40 mg via INTRAVENOUS
  Filled 2022-03-22: qty 4

## 2022-03-22 MED ORDER — LORAZEPAM 2 MG/ML IJ SOLN
0.5000 mg | INTRAMUSCULAR | Status: DC | PRN
  Administered 2022-03-22 – 2022-03-28 (×15): 1 mg via INTRAVENOUS
  Administered 2022-03-28 – 2022-03-29 (×2): 0.5 mg via INTRAVENOUS
  Filled 2022-03-22 (×18): qty 1

## 2022-03-22 MED ORDER — PIPERACILLIN-TAZOBACTAM 3.375 G IVPB
3.3750 g | Freq: Three times a day (TID) | INTRAVENOUS | Status: DC
  Administered 2022-03-22 – 2022-03-24 (×5): 3.375 g via INTRAVENOUS
  Filled 2022-03-22 (×5): qty 50

## 2022-03-22 MED ORDER — TRAVASOL 10 % IV SOLN
INTRAVENOUS | Status: AC
  Filled 2022-03-22: qty 1248

## 2022-03-22 NOTE — Consult Note (Signed)
WOC Nurse Consult Note: ?Patient receiving care in [facility] ICU 1223. ?I have requested the [place] order a small VAC dressing, Kellie Simmering [phone] for use on Tuesday. ? ?[place] Nurse ostomy follow up ?Stoma type/location: LUQ colostomy ?Stomal assessment/size: approximately 1.5 inches, slightly budded. Pouch had been changed during the night. Supplies ordered to begin to use tomorrow. ?Peristomal assessment: deferred ?Treatment options for stomal/peristomal skin: barrier ring ?Output: none  ?Ostomy pouching: 1pc. Pouch Lawson #[id number]; barrier Antionette Poles [phone] ?Education provided: none ?Enrolled patient in [place] Start Discharge program: No  ? ?Additionally, scrotum is severely edematous. I have ordered Estrella Deeds [phone] for nursing to wrap around the scrotum to attempt to prevent MASD to the area. ? ?Val Riles, RN, MSN, CWOCN, CNS-BC, pager [phone]  ?  ?

## 2022-03-22 NOTE — Progress Notes (Signed)
PHARMACY - TOTAL PARENTERAL NUTRITION CONSULT NOTE  ? ?Indication: rectal perforation, prolonged NPO ? ?Patient Measurements: ?Height: '5\' 9"'$  (175.3 cm) ?Weight: 82.2 kg (181 lb 3.5 oz) ?IBW/kg (Calculated) : 70.7 ?TPN AdjBW (KG): 82.2 ?Body mass index is 26.76 kg/m?. ? ?Assessment: 71 yo male with history of rectal bleeding and prolapsed internal hemorrhoids who recently underwent hemorrhoid surgery w/ hemorrhoidal dearterialization on 03/05/22.  He presented to [facility] on 4/25 with increasing abdominal pain, nausea, and fever.  CT abdomen/pelvis with thickening of rectal wall; gas and fluid within the rectal wall and in presacral space concerning for rectal perforation.  Pharmacy has been consulted to manage TPN as patient unable to tolerate PO currently and for the foreseeable future.  ? ?Glucose / Insulin: no history of DM, CBGs range 114-131 ?- 4 units insulin aspart given in past 24 hours  ?- Continue q6h monitoring for hypoglycemia ?- 5/6 Dexamethasone '5mg'$  ?Electrolytes:  High Cl, low CO2, Phos borderline low 2.5, Na increasing up to 140 (monitor).  K at goal (>/= 4), Mag at goal (>/= 2), CorrCa 9.5 ?Renal: Scr down to 1, BUN 44  ?Hepatic: AST/ALT, Tbili WNL.  Albumin <1.5, TG 116 ?Intake / Output: Net I/O + 2.6 L yesterday  (Admission I/O net +16L) ?- UOP down to 1600 ml/24h, drains down to 378m, stool 75 mL  ?- MIVF: LR @ 150 ml/hr (stopped 5/8 0000) ?- CCM reports severe scrotal edema, lasix '40mg'$  IV  ?GI Imaging: ?- 4/25 CTAP: rectal wall thickening w/ gas tracking into the anterior rectal wall, presacral fluid as well as stranding and gas in the mesorectal fat; consistent w/ rectal perforation ?- 4/30 CT scan: extraluminal air tracking into mesenteric space but no free peritoneal fluid   ?GI Surgeries / Procedures:  ?- 4/21 (prior to admit): Transanal hemorrhoidal dearterialization procedure ?- 5/1 Proctoscopy, debridement right gluteal abscess with anal canal necrosis  ?- 5/6 ex lap, ostomy creation and  drain placement ? ?Central access: PICC placed 4/28 ?TPN start date: 03/12/22 ? ?Nutritional Goals: ?Goal TPN rate is 100 mL/hr (provides 125 g of protein and 2395 kcals per day) ? ?RD Assessment:  (5/4) ?Estimated Needs ?Total Energy Estimated Needs: 2300-2500 kcal ?Total Protein Estimated Needs: 115-130 grams ?Total Fluid Estimated Needs: >/= 2 L/day ? ? ?Current Nutrition:  ?NPO except for sips w/ meds ?NG tube - removed by pt on 5/8 ?TPN ? ?Plan:  ?Continue TPN at 1040mhr (goal rate) ?Electrolytes in TPN:  ?Na 80 mEq/L ?K  5087mL ?Ca 5mE24m ?Mg 8 mEq/L  ?Phos 20 mmol/L ?Cl:Ac - Max Ac ?Continue standard MVI and trace elements in TPN ?Continue Sensitive SSI q6h and adjust as needed  ?Add Pepcid '40mg'$  in TPN, d/c IVPB order, d/c Pantoprazole tablet since holding PO meds per Dr. ThomManon Hildinges.   ?Monitor TPN labs on Mon/Thurs and PRN ? ? ?ChriGretta ArabrmD, BCPS ?Clinical Pharmacist ?[facility] [phone]/2023 7:11 AM ? ?

## 2022-03-22 NOTE — Progress Notes (Signed)
Upon entering room it was noted that patient had completely removed his NG tube that had been placed in the left nare. Patient does not remember doing this and confusion has progressed throughout the night. Patient has been reminded continuously about placing PCA and other relief meds have been given when able. Anti-anxiety medication has been ordered every 8 hours and he may benefit from having that frequency increased. Proper sleep hygiene practices in place yet patient has not slept more than 30 minutes at a time. ? ?I notified e-link of NG dislodgement. NG has had minimal output since placed, and no meds or nutrition are being provided this route. Day shift nursing staff said the plan was to potentially remove it today, and at this time he will not let me replace it anyways. If confusion progresses patient will be placed in mitts so no other equipment is removed. I will hold NPO's other than swabs with water to prevent risk for aspiration until it is decided if he needs NG replaced.  ? ?PT is also now refusing to wear SCD's so another form of VTE will need to be addressed.  ?

## 2022-03-22 NOTE — Progress Notes (Signed)
? ?NAME:  Tyler [name], MRN:  [id number], DOB:  October 06, 1951, LOS: 12 ?ADMISSION DATE:  03/09/2022, CONSULTATION DATE:  03/22/22 ?REFERRING MD:  Tyler [name], CHIEF COMPLAINT:  sepsis  ? ?History of Present Illness:  ?Tyler [name] is a 71 y.o. M with PMH of Asthma, GERD and HTN who underwent a routine transanal hemorrhoidal dearterialization on 4/21.   He experienced worsening abdominal pain, nausea and fever, so presented to the ED on 4/25.   CT scan of the abdomen and pelvis was performed.  This showed thickening of the rectal wall.  He was admitted by surgery and treated conservatively with Cefepime and Flagyl, however continued to have pain and fever, and repeat CT with possible perforation, so was taken to the OR for I&D of the anal canal on 5/1 there was necrosis of the L posterior anal canal which was debrided.   He initially felt better, but over the last 48 hours has continued to be febrile with WBC increasing to 33k.   He reports continued lower abdominal pain and nausea.  BP has been stable, though HR increased to 120-130.  He has been passing gas and blood per rectum and making urine.  Pt was transferred to stepdown and PCCM consulted ? ?Pertinent  Medical History  ? 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 ? ? ?Significant Hospital Events: ?Including procedures, antibiotic start and stop dates in addition to other pertinent events   ?4/21 hemorrhoidal dearterialization ?4/25 admitted to surgery with fever and  pain, thickening of rectal wall  ?5/1 Taken to OR for anal canal I&D and rectal wall necrosis debridement ?5/3 signs of worsening sepsis, transfer to progressive and PCCM consult ?CT abd/pelvis 4/25 Rectal wall thickening with gas tracking into the anterior rectal ?wall, presacral fluid as well as stranding and gas in the mesorectal fat, findings most consistent with rectal perforation. CT abd/pelvis 4/30 1. Significant increase in extraluminal extraperitoneal gas in the abdomen and pelvis from CT 5 days ago as described above. Source of air likely represents rectal perforation. There is generalized increase in stranding and ill-defined fluid associated with this extraluminal air. No peripherally enhancing fluid collection. 2. Left inguinal hernia contains tracking air and small amount of non organized free fluid. ?5/4 HR better end organ fxn stable ?5/6 Has worsening infection on CT scan and patient taken back to OR for diverting colostomy with ostomy creation and abd wash out.  PCCM reconsulted for hypotension ? ? ?Interim History / Subjective:  ?Anxious and confused per RN ?Dislodged NGT overnight ?Patient refused SCDs however agreeable to wear this am ? ?Objective   ?Blood pressure (!) 142/80, pulse (!) 115, temperature 98.7 ?F (37.1 ?C), temperature source Oral, resp. rate (!) 27, height '5\' 9"'$  (1.753 m), weight 82.2 kg, SpO2 97 %. ?CVP:  [6 mmHg-7 mmHg] 7 mmHg  ?   ? ?Intake/Output Summary (Last 24 hours) at 03/22/2022 0714 ?Last data filed at 03/22/2022 0086 ?Gross per 24 hour  ?Intake 4636.49 ml  ?Output 1968 ml  ?Net 2668.49 ml  ? ?Filed Weights  ? 03/09/22 1410 03/18/22 0800  ?Weight: 77.2 kg 82.2 kg  ? ?Physical  Exam: ?General: Chronically ill-appearing, no acute distress, mildly confused ?HENT: [place], AT, OP clear, MMM ?Eyes: EOMI, no scleral icterus ?Respiratory: Clear to auscultation bilaterally.  No crackles, wheezing or rales ?Cardiovascular: Tachycardic, RR, -M/R/G, no JVD ?GI: Firm, TTP, hypoactive BS+,  drains x 3 ?Extremities: 2+ pitting edema,-tenderness ?Neuro: AAO x4, CNII-XII grossly intact ?Psych: Anxious mood, normal affect ?GU: Severe scrotal edema, foley in place ? ?CFB +16L ?Improving CO2 ?Albumin <1.5 ?LA 03/20/22 2.1 ?WBC 13.9 slightly increased ? ?Bcx 5/3 NGTD ?Would culture 5/6 Few GNC, Few enterococcus, final pending ? ?Imaging, labs and test noted above have been reviewed independently by me. ? ? ?Resolved Hospital Problem list   ? ? ?Assessment & Plan:  ? ?Principal Problem: ?  Rectal perforation ([place]) ?Active Problems: ?  Abdominal pain ?  Acute urinary retention ?  Sepsis ([place]) ?  Ileus following gastrointestinal surgery (HCC) ?  Normal anion gap metabolic acidosis ?  Anemia ?  Post-op pain ? ? ?Sepsis from anal abscess  c/ b Necrotic anal wall  ?S/p I&D and debridement on 5/1  ?Diverting ostomy, washout on 5/6 ?Post-op management per Surgery ?NPO for bowel rest. TPN for nutrition ?Continue broad antibiotic coverage with vancomycin, Flagyl, cefepime ?Consider PCA pump as he is in severe pain ? ?Ileus exacerbated by narcotics ?Plan ?NGT pulled overnight ?Monitor ? ?NAGMA in context of hyperchloremia - resolving ?Plan ?Follow labs ? ?Anemia ?S/p 2 units PRBCs in OR  ?Plan ?Trend cbc ? ?AKI secondary to ATN in setting of sepsis - resolved ?Gentle hydration.  Follow labs ? ?Volume overload ?Scrotal/extremity edema ?Discontinue maintenance IVF ?Diurese lasix 40 mg x 1 ? ?ICU delirium ?Anxiety ?Pain control with PCA above ?IV ativan q8 PRN ? ?Acute hypoxemic respiratory failure secondary atelectasis, pain ?Wean supplemental O2 for goal SpO2 >88% ?Incentive spirometry ? ?Best Practice (right click and "Reselect all SmartList Selections" daily)  ? ?Diet/type: TPN ?DVT prophylaxis: SCD Patient refused overnight ?GI prophylaxis: N/A ?Lines: yes and it is still needed ?Foley:  Yes, and it is still needed ?Code Status:  full code ?Last date of multidisciplinary goals of care discussion [per primary  ] ? ?Critical care time:   ? ?The patient is critically ill with multiple organ systems failure and requires high complexity decision making for assessment and support, frequent evaluation and titration of therapies, application of advanced monitoring technologies and extensive interpretation of multiple databases.  Independent Critical Care Time: 50 Minutes.  ? ?Rodman Pickle, M.D. ?[facility] ?03/22/2022 7:14 AM  ? ?Please see Amion for pager number to reach on-call Pulmonary and Critical Care Team. ? ? ? ?

## 2022-03-22 NOTE — Progress Notes (Signed)
2 Days Post-Op ex lap, ostomy creation and drain placement ?Subjective: ?More swelling today, good UOP, less fevers, wbc up, anxious at night, pulled out NG (low output) ? ?Objective: ?Vital signs in last 24 hours: ?Temp:  [98.7 ?F (37.1 ?C)-100.8 ?F (38.2 ?C)] 100.5 ?F (38.1 ?C) (05/08 0800) ?Pulse Rate:  [102-116] 111 (05/08 0800) ?Resp:  [17-34] 26 (05/08 0800) ?BP: (111-146)/(64-87) 138/82 (05/08 0800) ?SpO2:  [95 %-99 %] 98 % (05/08 0800) ? ? ?Intake/Output from previous day: ?05/07 0701 - 05/08 0700 ?In: 4636.5 [I.V.:3801.9; NG/GT:30; IV Piggyback:804.6] ?Out: 1968 [Urine:1600; Drains:293; Stool:75] ?Intake/Output this shift: ?Total I/O ?In: 382.1 [I.V.:282.1; IV Piggyback:100] ?Out: 660 [Urine:575; Drains:85] ? ? ?General appearance: alert and cooperative ?GI: soft, distended, appropriately TTP ?Pelvis JP with brown fluid ?Sub Q and RP drains with cloudy, brown fluid ?Wound vac in place ?Colostomy viable with clear drainage and air ? ?Lab Results:  ?Recent Labs  ?  03/21/22 ?9485 03/22/22 ?0415  ?WBC 11.8* 13.9*  ?HGB 7.9* 7.9*  ?HCT 23.0* 24.3*  ?PLT 366 429*  ? ? ?BMET ?Recent Labs  ?  03/21/22 ?0833 03/22/22 ?0415  ?NA 137 140  ?K 3.9 4.0  ?CL 115* 116*  ?CO2 17* 18*  ?GLUCOSE 130* 125*  ?BUN 45* 44*  ?CREATININE 1.09 1.00  ?CALCIUM 7.2* 7.5*  ? ? ?PT/INR ?No results for input(s): LABPROT, INR in the last 72 hours. ? ?ABG ?No results for input(s): PHART, HCO3 in the last 72 hours. ? ?Invalid input(s): PCO2, PO2 ? ?MEDS, Scheduled ? sodium chloride   Intravenous Once  ? Chlorhexidine Gluconate Cloth  6 each Topical Q0600  ? ferrous sulfate  325 mg Oral TID  ? HYDROmorphone   Intravenous Q4H  ? insulin aspart  0-9 Units Subcutaneous Q4H  ? lip balm  1 application. Topical BID  ? mouth rinse  15 mL Mouth Rinse BID  ? pantoprazole  40 mg Oral Daily  ? sodium chloride flush  10-40 mL Intracatheter Q12H  ? ? ?Studies/Results: ?No results found. ? ?Assessment: ?s/p  ?Patient Active Problem List  ? Diagnosis Date  Noted  ? Sepsis ([place]) 03/18/2022  ? Ileus following gastrointestinal surgery ([place]) 03/18/2022  ? Normal anion gap metabolic acidosis 46/27/0350  ? Anemia 03/18/2022  ? Post-op pain 03/18/2022  ? Acute urinary retention 03/12/2022  ? 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 Mild persistent asthma 03/12/2022  ? Abdominal pain 03/09/2022  ? Rectal perforation ([place]) 03/09/2022  ? Pruritus ani 04/20/2018  ? Arthritis of both knees 01/13/2018  ? Hx of adenomatous polyp of colon 04/07/2017  ? Perianal dermatitis 01/14/2016  ? 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/12/2013  ? History of tobacco use 11/12/2013  ? Low testosterone 09/19/2013  ? Sinusitis 12/21/2012  ? 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 Eczema 10/30/2012  ? Prolapsed internal hemorrhoids, grade 3 10/30/2012  ? Hypertension 04/13/2011  ? [place], [place] 05/14/2010  ? NEOPLASM, SKIN, UNCERTAIN BEHAVIOR 09/38/1829  ? ALLERGIC RHINITIS 08/16/2008  ? Rash and other nonspecific skin eruption 07/02/2008  ? ABNORMAL COAGULATION PROFILE 05/29/2008  ? Gastroesophageal reflux disease 06/28/2007  ? PITYRIASIS [place] 06/28/2007  ? OSTEOARTHROSIS, LOCAL NOS, LOWER LEG 06/28/2007  ? Asthma, chronic 05/11/2007  ? ? ?Pelvic sepsis.  Currently drained and debrided.   ? 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 ?CT 5/5 shows significant retroperitoneal contamination.   ? ?OR findings 5/6: significant RP infection, drained via L pericolic  gutter, presacral infection, drained, anterior abd wall infection, drained. ? ? ?Plan: ?Cont NPO and bowel rest for now ?Cont TPN ? ?Cont IV antibiotics, Flagyl/Vanc added to regimen 5/3.  Blood cultures NGTD.  Can d/c Vanc at this point, Recheck cbc in AM ? ?SCD's ?Perineal dressing changes q shift ?dilaudid PCA for pain control. ?Hold PO meds for now ?Incentive spirometer Q1h while awake ? ? LOS: 12 days  ? ? ? ?Marland KitchenRosario Adie, MD ?[facility],  [place] ? ? ? ?03/22/2022 ?8:48 AM ? ? ? ?  ?

## 2022-03-23 DIAGNOSIS — K631 Perforation of intestine (nontraumatic): Secondary | ICD-10-CM | POA: Diagnosis not present

## 2022-03-23 LAB — BASIC METABOLIC PANEL
Anion gap: 8 (ref 5–15)
BUN: 45 mg/dL — ABNORMAL HIGH (ref 8–23)
CO2: 24 mmol/L (ref 22–32)
Calcium: 7.6 mg/dL — ABNORMAL LOW (ref 8.9–10.3)
Chloride: 112 mmol/L — ABNORMAL HIGH (ref 98–111)
Creatinine, Ser: 1.13 mg/dL (ref 0.61–1.24)
GFR, Estimated: >60 mL/min (ref >60)
Glucose, Bld: 127 mg/dL — ABNORMAL HIGH (ref 70–99)
Potassium: 3.5 mmol/L (ref 3.5–5.1)
Sodium: 144 mmol/L (ref 135–145)

## 2022-03-23 LAB — GLUCOSE, CAPILLARY
Glucose-Capillary: 100 mg/dL — ABNORMAL HIGH (ref 70–99)
Glucose-Capillary: 103 mg/dL — ABNORMAL HIGH (ref 70–99)
Glucose-Capillary: 122 mg/dL — ABNORMAL HIGH (ref 70–99)
Glucose-Capillary: 137 mg/dL — ABNORMAL HIGH (ref 70–99)
Glucose-Capillary: 143 mg/dL — ABNORMAL HIGH (ref 70–99)
Glucose-Capillary: 97 mg/dL (ref 70–99)

## 2022-03-23 LAB — CBC
HCT: 23.7 % — ABNORMAL LOW (ref 39.0–52.0)
Hemoglobin: 8.1 g/dL — ABNORMAL LOW (ref 13.0–17.0)
MCH: 35.4 pg — ABNORMAL HIGH (ref 26.0–34.0)
MCHC: 34.2 g/dL (ref 30.0–36.0)
MCV: 103.5 fL — ABNORMAL HIGH (ref 80.0–100.0)
Platelets: 402 10*3/uL — ABNORMAL HIGH (ref 150–400)
RBC: 2.29 MIL/uL — ABNORMAL LOW (ref 4.22–5.81)
RDW: 16.4 % — ABNORMAL HIGH (ref 11.5–15.5)
WBC: 10.4 10*3/uL (ref 4.0–10.5)
nRBC: 0 % (ref 0.0–0.2)

## 2022-03-23 LAB — PHOSPHORUS
Phosphorus: 9.1 mg/dL — ABNORMAL HIGH (ref 2.5–4.6)
Phosphorus: 3.1 mg/dL (ref 2.5–4.6)

## 2022-03-23 LAB — MAGNESIUM
Magnesium: 2.8 mg/dL — ABNORMAL HIGH (ref 1.7–2.4)
Magnesium: 2.2 mg/dL (ref 1.7–2.4)

## 2022-03-23 MED ORDER — FUROSEMIDE 10 MG/ML IJ SOLN
40.0000 mg | Freq: Once | INTRAMUSCULAR | Status: AC
  Administered 2022-03-23: 40 mg via INTRAVENOUS
  Filled 2022-03-23: qty 4

## 2022-03-23 MED ORDER — POTASSIUM CHLORIDE 10 MEQ/50ML IV SOLN
10.0000 meq | INTRAVENOUS | Status: AC
  Administered 2022-03-23 (×4): 10 meq via INTRAVENOUS
  Filled 2022-03-23 (×4): qty 50

## 2022-03-23 MED ORDER — ACETAMINOPHEN 10 MG/ML IV SOLN
1000.0000 mg | Freq: Once | INTRAVENOUS | Status: AC
  Administered 2022-03-23: 1000 mg via INTRAVENOUS
  Filled 2022-03-23: qty 100

## 2022-03-23 MED ORDER — TRAVASOL 10 % IV SOLN
INTRAVENOUS | Status: AC
  Filled 2022-03-23: qty 1248

## 2022-03-23 MED ORDER — ACETAMINOPHEN 10 MG/ML IV SOLN
1000.0000 mg | Freq: Four times a day (QID) | INTRAVENOUS | Status: DC | PRN
  Administered 2022-03-23 – 2022-03-24 (×2): 1000 mg via INTRAVENOUS
  Filled 2022-03-23 (×2): qty 100

## 2022-03-23 NOTE — Progress Notes (Signed)
eLink Physician-Brief Progress Note ?Patient Name: Tyler [name] ?DOB: 10-02-1951 ?MRN: [id number] ? ? ?Date of Service ? 03/23/2022  ?HPI/Events of Note ? Patient with temperature of 101, he is unable to receive enteral or rectal Tylenol.  ?eICU Interventions ? Ofirmev 1000 mg iv x 1 ordered.  ? ? ? ?  ? ?Kerry Kass Rozanna Cormany ?03/23/2022, 4:13 AM ?

## 2022-03-23 NOTE — Progress Notes (Signed)
? ?NAME:  Tyler [name], MRN:  [id number], DOB:  05/31/1951, LOS: 64 ?ADMISSION DATE:  03/09/2022, CONSULTATION DATE:  03/23/22 ?REFERRING MD:  Tyler [name], CHIEF COMPLAINT:  sepsis  ? ?History of Present Illness:  ?Tyler [name] is a 71 y.o. M with PMH of Asthma, GERD and HTN who underwent a routine transanal hemorrhoidal dearterialization on 4/21.   He experienced worsening abdominal pain, nausea and fever, so presented to the ED on 4/25.   CT scan of the abdomen and pelvis was performed.  This showed thickening of the rectal wall.  He was admitted by surgery and treated conservatively with Cefepime and Flagyl, however continued to have pain and fever, and repeat CT with possible perforation, so was taken to the OR for I&D of the anal canal on 5/1 there was necrosis of the L posterior anal canal which was debrided.   He initially felt better, but over the last 48 hours has continued to be febrile with WBC increasing to 33k.   He reports continued lower abdominal pain and nausea.  BP has been stable, though HR increased to 120-130.  He has been passing gas and blood per rectum and making urine.  Pt was transferred to stepdown and PCCM consulted ? ?Pertinent  Medical History  ? 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 ? ? ?Significant Hospital Events: ?Including procedures, antibiotic start and stop dates in addition to other pertinent events   ?4/21 hemorrhoidal dearterialization ?4/25 admitted to surgery with fever and  pain, thickening of rectal wall  ?5/1 Taken to OR for anal canal I&D and rectal wall necrosis debridement ?5/3 signs of worsening sepsis, transfer to progressive and PCCM consult ?CT abd/pelvis 4/25 Rectal wall thickening with gas tracking into the anterior rectal ?wall, presacral fluid as well as stranding and gas in the mesorectal fat, findings most consistent with rectal perforation. CT abd/pelvis 4/30 1. Significant increase in extraluminal extraperitoneal gas in the abdomen and pelvis from CT 5 days ago as described above. Source of air likely represents rectal perforation. There is generalized increase in stranding and ill-defined fluid associated with this extraluminal air. No peripherally enhancing fluid collection. 2. Left inguinal hernia contains tracking air and small amount of non organized free fluid. ?5/4 HR better end organ fxn stable ?5/6 Has worsening infection on CT scan and patient taken back to OR for diverting colostomy with ostomy creation and abd wash out.  PCCM reconsulted for hypotension ? ? ? ?Interim History / Subjective:  ?Hypotension has resolved >48h ?Diuresed well with lasix ? ?Objective   ?Blood pressure (!) 153/87, pulse (!) 110, temperature 99.3 ?F (37.4 ?C), temperature source Axillary, resp. rate (!) 37, height '5\' 9"'$  (1.753 m), weight 82.2 kg, SpO2 100 %. ?CVP:  [8 mmHg-10 mmHg] 8 mmHg  ?FiO2 (%):  [0 %] 0 %  ? ?Intake/Output Summary (Last 24 hours) at 03/23/2022 0925 ?Last data filed at 03/23/2022 0807 ?Gross per 24 hour  ?Intake 1969.57 ml  ?Output 5251 ml  ?Net -3281.43 ml  ? ?Filed Weights  ? 03/09/22 1410 03/18/22 0800  ?Weight: 77.2 kg 82.2 kg  ? ?Physical Exam: ?General: Chronically  ill-appearing, no acute distress ?HENT: , AT, OP clear, MMM ?Eyes: EOMI, no scleral icterus ?Respiratory: Clear to auscultation bilaterally.  No crackles, wheezing or rales ?Cardiovascular: RRR, -M/R/G, no JVD ?GI: Firm but improved, hypoactive BS+, soft, mild TTP, drains x 3 ?Extremities: 2+  pitting edema and severe scrotal edema,+tenderness ?Neuro: AAO x4, CNII-XII grossly intact ?Skin: Intact, no rashes or bruising ?Psych: Normal mood, normal affect ?GU: Foley in place ? ?CFB +16L>+12L ?BMET pending ?Leukocytosis has resolved ? ?Bcx 5/3 NGTD ?Would culture 5/6 Few Ecoli, Few enterococcus ? ?Imaging, labs and test noted above have been reviewed independently by me. ? ?Resolved Hospital Problem list   ?Sepsis secondary to abscess - resolved ?AKI secondary to ATN in setting of sepsis ?Assessment & Plan:  ? ?Principal Problem: ?  Rectal perforation ([place]) ?Active Problems: ?  Abdominal pain ?  Acute urinary retention ?  Sepsis ([place]) ?  Ileus following gastrointestinal surgery (HCC) ?  Normal anion gap metabolic acidosis ?  Anemia ?  Post-op pain ? ? ?Anal abscess +Ecoli, Enterococcus ?C/B necrotic anal wall  ?S/p I&D and debridement on 5/1  ?Diverting ostomy, washout on 5/6 ?Post-op management per Surgery ?NPO for bowel rest. TPN for nutrition ?Continue antibiotics with Vanc and Zosyn ?PCA pump ? ?Ileus exacerbated by narcotics ?Plan ?Bowel regimen ?Bowel rest ? ?NAGMA in context of hyperchloremia - resolving ?Plan ?Follow labs ? ?Anemia ?S/p 2 units PRBCs in OR  ?Plan ?Trend cbc ? ?Volume overload ?Scrotal/extremity edema ?Discontinue maintenance IVF ?Diurese ?Monitor K and replete as needed ? ?ICU delirium ?Anxiety ?Pain control with PCA above ?IV ativan q8 PRN ? ?Acute hypoxemic respiratory failure secondary atelectasis, pain ?Wean supplemental O2 for goal SpO2 >88% ?Incentive spirometry ? ?Best Practice (right click and "Reselect all SmartList Selections" daily)  ? ?Diet/type: TPN ?DVT prophylaxis: SCD Patient refused overnight ?GI prophylaxis: N/A ?Lines: yes and it is still needed ?Foley:  Yes, and it is still needed ?Code Status:  full code ?Last date of multidisciplinary goals of care discussion [per primary ] ? ?Critical care time:   ? ?Care Time: 50 min ? ?Tyler [name], M.D. ?[facility] ?03/23/2022 9:25 AM  ? ?Please see Amion for pager number to reach on-call Pulmonary and Critical Care Team. ? ? ? ? ?

## 2022-03-23 NOTE — Consult Note (Signed)
WOC Nurse Consult Note: ?Patient receiving care in [facility] ICU 1223. ?Reason for Consult: VAC dressing change to lower abdomen surgical incision ?Wound type: surgical ?Pressure Injury POA: Yes/No/NA ?Measurement: 0.8 cm x 11.2 cm x 1.1 cm ?Wound bed: 90% yellow, 10% pink ?Drainage (amount, consistency, odor) murky in [facility] cannister ?Periwound: impacted by Medical Adhesive Related Skin Injury (MARSI). ?Dressing procedure/placement/frequency: ?One piece of black foam removed from over the wound bed. A false bottom had developed, which was easily broken up with light use of a cotton tipped applicator. Sutures are visible in the wound bed. ?Despite my best efforts I could not get the narrow piece of black foam tucked into the slit that is the wound. I did the best I could with assistance from another RN in holding the piece in place. The areas of periwound MARSI were crusted with stoma powder and skin prep. Drape was applied and an immediate seal obtained.  I sent a Secure Chat message to Dr. Marcello Moores about the foam placement difficulty and my recommendations to switch to a twice daily saline moistened gauze dressing. ? ?[place] Nurse ostomy follow up ?Stoma type/location: LUQ colostomy ?Stomal assessment/size: 1.5 inches, pink, moist, sutures intact ?Peristomal assessment: impacted by [facility] also. Areas crusted with stoma powder and skin prep ?Treatment options for stomal/peristomal skin: barrier ring, crusting, convex pouch ?Output: approximately 25 ml of thin brown  ?Ostomy pouching: 1pc. convex ?Education provided: none, patient was mumbling incoherently during my care. ?Enrolled patient in [place] Start Discharge program: No  ?Val Riles, RN, MSN, CWOCN, CNS-BC, pager [phone]  ?

## 2022-03-23 NOTE — Progress Notes (Signed)
PHARMACY - TOTAL PARENTERAL NUTRITION CONSULT NOTE  ? ?Indication: rectal perforation, prolonged NPO ? ?Patient Measurements: ?Height: '5\' 9"'$  (175.3 cm) ?Weight: 82.2 kg (181 lb 3.5 oz) ?IBW/kg (Calculated) : 70.7 ?TPN AdjBW (KG): 82.2 ?Body mass index is 26.76 kg/m?. ? ?Assessment: 71 yo male with history of rectal bleeding and prolapsed internal hemorrhoids who recently underwent hemorrhoid surgery w/ hemorrhoidal dearterialization on 03/05/22.  He [facility] to [facility] with increasing abdominal pain, nausea, and fever.  CT abdomen/pelvis with thickening of rectal wall; gas and fluid within the rectal wall and in presacral space concerning for rectal perforation.  Pharmacy has been consulted to manage TPN as patient unable to tolerate PO. ? ?Glucose / Insulin: no history of DM, CBGs range 103-143 ?- 2 units insulin aspart given in past 24 hours  ?- Continue q6h monitoring for hypoglycemia ?- 5/1 Dexamethasone '4mg'$ , 5/6 Dexamethasone '5mg'$  ?Electrolytes:  High Cl, Na increased to 144 (Na decreased 5/8),  K down to 3.5 s/p lasix (>/= 4), Mag at goal (>/= 2), CorrCa 9.5 ?Renal: stable - Scr 1.13, BUN 45 ?Hepatic: AST/ALT, Tbili WNL.  Albumin <1.5, TG 116 ?Intake / Output: Net I/O -3.4 L yesterday  (Admission I/O net +16L) ?- UOP up to 5.5L/24h, drains down to 214m, stool 100 mL  ?- MIVF none; Severe scrotal edema:  lasix '40mg'$  IV  ?GI Imaging: ?- 4/25 CTAP: rectal wall thickening w/ gas tracking into the anterior rectal wall, presacral fluid as well as stranding and gas in the mesorectal fat; consistent w/ rectal perforation ?- 4/30 CT scan: extraluminal air tracking into mesenteric space but no free peritoneal fluid   ?- 5/5 CT: Progressive Necrotizing Infection, ileus.  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 ?Central  access: PICC placed 4/28 ?TPN start date: 03/12/22 ? ?Nutritional Goals: ?Goal TPN rate is 100 mL/hr (provides 125 g of protein and 2395 kcals per day) ? ?RD Assessment:  (5/4) ?Estimated Needs ?Total Energy Estimated Needs: 2300-2500 kcal ?Total Protein Estimated Needs: 115-130 grams ?Total Fluid Estimated Needs: >/= 2 L/day ? ? ?Current Nutrition:  ?NPO; NG tube removed by pt on 5/8 ?TPN ? ?Plan:  ?Now:  Give extra KCl 10 mEq IV x4 runs replacement for ongoing lasix doses.  ?At 18:00: Continue TPN at 1020mhr (goal rate) ?Electrolytes in TPN:  ?Na 30 mEq/L ?K  6035mL ?Ca 5mE9m ?Mg 8 mEq/L  ?Phos 20 mmol/L  ?Cl:Ac - Max Ac ?Continue standard MVI and trace elements in TPN ?Continue Sensitive SSI q6h and adjust as needed  ?Continue Pepcid '40mg'$  in TPN   ?Monitor TPN labs on Mon/Thurs and PRN ? ? ?ChriGretta ArabrmD, BCPS ?Clinical Pharmacist ?[facility] [phone] [id number] 9:29 AM ? ?

## 2022-03-23 NOTE — Progress Notes (Signed)
3 Days Post-Op ex lap, ostomy creation and drain placement ?Subjective: ?Seems stable today, good UOP with lasix yesterday, still with fevers, wbc back down, mainly having anterior pelvic pain when he moves ?Objective: ?Vital signs in last 24 hours: ?Temp:  [98.8 ?F (37.1 ?C)-101.8 ?F (38.8 ?C)] 99.3 ?F (37.4 ?C) (05/09 9211) ?Pulse Rate:  [95-136] 110 (05/09 0807) ?Resp:  [20-37] 37 (05/09 0807) ?BP: (121-166)/(69-104) 153/87 (05/09 0800) ?SpO2:  [86 %-100 %] 100 % (05/09 0807) ?FiO2 (%):  [0 %] 0 % (05/09 0807) ? ? ?Intake/Output from previous day: ?05/08 0701 - 05/09 0700 ?In: 2451.3 [I.V.:1807.1; IV Piggyback:644.2] ?Out: 5886 [Urine:5525; Drains:261; Stool:100] ?Intake/Output this shift: ?Total I/O ?In: -  ?Out: 25 [Drains:25] ? ? ?General appearance: alert and cooperative ?GI: soft, distended, appropriately TTP ?Pelvis JP with brown fluid ?Sub Q and RP drains with cloudy, brown fluid ?Wound vac in place ?Colostomy viable with clear drainage and air ? ?Lab Results:  ?Recent Labs  ?  03/22/22 ?9417 03/23/22 ?0401  ?WBC 13.9* 10.4  ?HGB 7.9* 8.1*  ?HCT 24.3* 23.7*  ?PLT 429* 402*  ? ? ?BMET ?Recent Labs  ?  03/21/22 ?0833 03/22/22 ?0415  ?NA 137 140  ?K 3.9 4.0  ?CL 115* 116*  ?CO2 17* 18*  ?GLUCOSE 130* 125*  ?BUN 45* 44*  ?CREATININE 1.09 1.00  ?CALCIUM 7.2* 7.5*  ? ? ?PT/INR ?No results for input(s): LABPROT, INR in the last 72 hours. ? ?ABG ?No results for input(s): PHART, HCO3 in the last 72 hours. ? ?Invalid input(s): PCO2, PO2 ? ?MEDS, Scheduled ? sodium chloride   Intravenous Once  ? Chlorhexidine Gluconate Cloth  6 each Topical Q0600  ? ferrous sulfate  325 mg Oral TID  ? HYDROmorphone   Intravenous Q4H  ? insulin aspart  0-9 Units Subcutaneous Q4H  ? lip balm  1 application. Topical BID  ? mouth rinse  15 mL Mouth Rinse BID  ? sodium chloride flush  10-40 mL Intracatheter Q12H  ? ? ?Studies/Results: ?No results found. ? ?Assessment: ?s/p  ?Patient Active Problem List  ? Diagnosis Date Noted  ? Sepsis  ([place]) 03/18/2022  ? Ileus following gastrointestinal surgery ([place]) 03/18/2022  ? Normal anion gap metabolic acidosis 40/81/4481  ? Anemia 03/18/2022  ? Post-op pain 03/18/2022  ? Acute urinary retention 03/12/2022  ? 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 Mild persistent asthma 03/12/2022  ? Abdominal pain 03/09/2022  ? Rectal perforation ([place]) 03/09/2022  ? Pruritus ani 04/20/2018  ? Arthritis of both knees 01/13/2018  ? Hx of adenomatous polyp of colon 04/07/2017  ? Perianal dermatitis 01/14/2016  ? 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/12/2013  ? History of tobacco use 11/12/2013  ? Low testosterone 09/19/2013  ? Sinusitis 12/21/2012  ? 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 Eczema 10/30/2012  ? Prolapsed internal hemorrhoids, grade 3 10/30/2012  ? Hypertension 04/13/2011  ? [place], [place] 05/14/2010  ? NEOPLASM, SKIN, UNCERTAIN BEHAVIOR 85/63/1497  ? ALLERGIC RHINITIS 08/16/2008  ? Rash and other nonspecific skin eruption 07/02/2008  ? ABNORMAL COAGULATION PROFILE 05/29/2008  ? Gastroesophageal reflux disease 06/28/2007  ? PITYRIASIS ROSEA 06/28/2007  ? OSTEOARTHROSIS, LOCAL NOS, LOWER LEG 06/28/2007  ? Asthma, chronic 05/11/2007  ? ? ?Pelvic sepsis.  Currently drained and debrided.   ? 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 ?CT 5/5 shows significant retroperitoneal contamination.   ? ?OR findings 5/6: significant RP infection, drained via L  pericolic gutter, presacral infection, drained, anterior abd wall infection, drained. ? ? ?Plan: ?Cont NPO and bowel rest for now ?Cont TPN ? ?Cont IV antibiotics, Flagyl/Vanc added to regimen 5/3. Changed to Zosyn 5/8 due to pelvis cultures, will tailor as new info becomes available ? Blood cultures neg from 5/4.  Recheck cbc in AM ? ?SCD's ?Perineal dressing changes q shift ?dilaudid PCA for pain control. ?Hold PO meds for now ?Incentive spirometer Q1h while awake ? ? LOS: 13 days   ? ? ? ?Marland KitchenRosario Adie, MD ?[facility], [place] ? ? ? ?03/23/2022 ?8:49 AM ? ? ? ?  ?

## 2022-03-23 NOTE — Plan of Care (Signed)

## 2022-03-24 ENCOUNTER — Inpatient Hospital Stay (HOSPITAL_COMMUNITY): Payer: BC Managed Care – PPO

## 2022-03-24 DIAGNOSIS — K631 Perforation of intestine (nontraumatic): Secondary | ICD-10-CM | POA: Diagnosis not present

## 2022-03-24 DIAGNOSIS — M7989 Other specified soft tissue disorders: Secondary | ICD-10-CM | POA: Diagnosis not present

## 2022-03-24 LAB — GLUCOSE, CAPILLARY
Glucose-Capillary: 111 mg/dL — ABNORMAL HIGH (ref 70–99)
Glucose-Capillary: 136 mg/dL — ABNORMAL HIGH (ref 70–99)
Glucose-Capillary: 142 mg/dL — ABNORMAL HIGH (ref 70–99)
Glucose-Capillary: 111 mg/dL — ABNORMAL HIGH (ref 70–99)
Glucose-Capillary: 123 mg/dL — ABNORMAL HIGH (ref 70–99)
Glucose-Capillary: 149 mg/dL — ABNORMAL HIGH (ref 70–99)

## 2022-03-24 LAB — BASIC METABOLIC PANEL
Anion gap: 11 (ref 5–15)
BUN: 42 mg/dL — ABNORMAL HIGH (ref 8–23)
CO2: 26 mmol/L (ref 22–32)
Calcium: 7.2 mg/dL — ABNORMAL LOW (ref 8.9–10.3)
Chloride: 107 mmol/L (ref 98–111)
Creatinine, Ser: 1.2 mg/dL (ref 0.61–1.24)
GFR, Estimated: >60 mL/min (ref >60)
Glucose, Bld: 130 mg/dL — ABNORMAL HIGH (ref 70–99)
Potassium: 3.8 mmol/L (ref 3.5–5.1)
Sodium: 144 mmol/L (ref 135–145)

## 2022-03-24 LAB — CBC
HCT: 23.2 % — ABNORMAL LOW (ref 39.0–52.0)
Hemoglobin: 7.4 g/dL — ABNORMAL LOW (ref 13.0–17.0)
MCH: 32 pg (ref 26.0–34.0)
MCHC: 31.9 g/dL (ref 30.0–36.0)
MCV: 100.4 fL — ABNORMAL HIGH (ref 80.0–100.0)
Platelets: 354 10*3/uL (ref 150–400)
RBC: 2.31 MIL/uL — ABNORMAL LOW (ref 4.22–5.81)
RDW: 15 % (ref 11.5–15.5)
WBC: 7.4 10*3/uL (ref 4.0–10.5)
nRBC: 0 % (ref 0.0–0.2)

## 2022-03-24 LAB — PHOSPHORUS: Phosphorus: 2.8 mg/dL (ref 2.5–4.6)

## 2022-03-24 LAB — MAGNESIUM: Magnesium: 1.9 mg/dL (ref 1.7–2.4)

## 2022-03-24 MED ORDER — TRAVASOL 10 % IV SOLN
INTRAVENOUS | Status: AC
  Filled 2022-03-24: qty 1248

## 2022-03-24 MED ORDER — METRONIDAZOLE 500 MG/100ML IV SOLN
500.0000 mg | Freq: Two times a day (BID) | INTRAVENOUS | Status: DC
  Administered 2022-03-24 – 2022-03-28 (×9): 500 mg via INTRAVENOUS
  Filled 2022-03-24 (×9): qty 100

## 2022-03-24 MED ORDER — CEFAZOLIN SODIUM-DEXTROSE 2-4 GM/100ML-% IV SOLN
2.0000 g | Freq: Three times a day (TID) | INTRAVENOUS | Status: DC
  Administered 2022-03-24: 2 g via INTRAVENOUS
  Filled 2022-03-24: qty 100

## 2022-03-24 MED ORDER — SODIUM CHLORIDE 0.9 % IV SOLN
12.5000 mg | Freq: Four times a day (QID) | INTRAVENOUS | Status: DC | PRN
  Filled 2022-03-24 (×3): qty 0.5

## 2022-03-24 MED ORDER — ENSURE SURGERY PO LIQD
237.0000 mL | Freq: Two times a day (BID) | ORAL | Status: DC
  Administered 2022-03-25: 237 mL via ORAL
  Filled 2022-03-24 (×6): qty 237

## 2022-03-24 MED ORDER — CEFAZOLIN SODIUM-DEXTROSE 2-4 GM/100ML-% IV SOLN
2.0000 g | Freq: Three times a day (TID) | INTRAVENOUS | Status: DC
  Administered 2022-03-24 – 2022-03-28 (×11): 2 g via INTRAVENOUS
  Filled 2022-03-24 (×15): qty 100

## 2022-03-24 MED ORDER — SODIUM CHLORIDE 0.9 % IV SOLN
12.5000 mg | Freq: Once | INTRAVENOUS | Status: AC | PRN
  Administered 2022-03-24: 12.5 mg via INTRAVENOUS
  Filled 2022-03-24 (×2): qty 0.5

## 2022-03-24 MED ORDER — POTASSIUM CHLORIDE 10 MEQ/50ML IV SOLN
10.0000 meq | INTRAVENOUS | Status: AC
  Administered 2022-03-24 (×4): 10 meq via INTRAVENOUS
  Filled 2022-03-24 (×4): qty 50

## 2022-03-24 MED ORDER — VANCOMYCIN HCL IN DEXTROSE 1-5 GM/200ML-% IV SOLN
1000.0000 mg | INTRAVENOUS | Status: DC
  Administered 2022-03-25 – 2022-03-29 (×5): 1000 mg via INTRAVENOUS
  Filled 2022-03-24 (×5): qty 200

## 2022-03-24 MED ORDER — ENSURE ENLIVE PO LIQD
237.0000 mL | Freq: Two times a day (BID) | ORAL | Status: DC
  Administered 2022-03-24 – 2022-03-26 (×4): 237 mL via ORAL

## 2022-03-24 MED ORDER — FUROSEMIDE 10 MG/ML IJ SOLN
40.0000 mg | Freq: Once | INTRAMUSCULAR | Status: AC
  Administered 2022-03-24: 40 mg via INTRAVENOUS
  Filled 2022-03-24: qty 4

## 2022-03-24 MED ORDER — ACETAMINOPHEN 500 MG PO TABS
1000.0000 mg | ORAL_TABLET | Freq: Four times a day (QID) | ORAL | Status: DC
  Administered 2022-03-24 – 2022-04-02 (×34): 1000 mg via ORAL
  Filled 2022-03-24 (×36): qty 2

## 2022-03-24 MED ORDER — VANCOMYCIN HCL 1500 MG/300ML IV SOLN
1500.0000 mg | Freq: Once | INTRAVENOUS | Status: AC
  Administered 2022-03-24: 1500 mg via INTRAVENOUS
  Filled 2022-03-24: qty 300

## 2022-03-24 NOTE — Progress Notes (Signed)
PHARMACY - TOTAL PARENTERAL NUTRITION CONSULT NOTE  ? ?Indication: rectal perforation, prolonged NPO ? ?Patient Measurements: ?Height: '5\' 9"'$  (175.3 cm) ?Weight: 82.2 kg (181 lb 3.5 oz) ?IBW/kg (Calculated) : 70.7 ?TPN AdjBW (KG): 82.2 ?Body mass index is 26.76 kg/m?. ? ?Assessment: 71 yo male with history of rectal bleeding and prolapsed internal hemorrhoids who recently underwent hemorrhoid surgery w/ hemorrhoidal dearterialization on 03/05/22.  He presented to [facility] on 4/25 with increasing abdominal pain, nausea, and fever.  CT abdomen/pelvis with thickening of rectal wall; gas and fluid within the rectal wall and in presacral space concerning for rectal perforation.  Pharmacy has been consulted to manage TPN as patient unable to tolerate PO. ? ?Glucose / Insulin: no history of DM, CBGs at goal < 150 ?- 2 units insulin aspart given in past 24 hours  ?- Continue q6h monitoring for hypoglycemia ?- 5/1 Dexamethasone '4mg'$ , 5/6 Dexamethasone '5mg'$  ?Electrolytes:   WNL except K at 3.8 (>/= 4), Mag 1.9 (>/= 2), CorrCa 9.2 ?Renal: stable - Scr 1.2 up some, BUN 42 ?Hepatic: AST/ALT, Tbili WNL.  Albumin <1.5, TG 116 ?Intake / Output: Net I/O -3.2 L yesterday  (Admission I/O net +5L) ?- UOP up to 6L/24h, drains down to 323m, stool 220 mL  ?- MIVF none; Severe scrotal edema:  lasix '40mg'$  IV  ?GI Imaging: ?- 4/25 CTAP: rectal wall thickening w/ gas tracking into the anterior rectal wall, presacral fluid as well as stranding and gas in the mesorectal fat; consistent w/ rectal perforation ?- 4/30 CT scan: extraluminal air tracking into mesenteric space but no free peritoneal fluid   ?- 5/5 CT: Progressive Necrotizing Infection, ileus.  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 ?Central access: PICC placed 4/28 ?TPN start date:  03/12/22 ? ?Nutritional Goals: ?Goal TPN rate is 100 mL/hr (provides 125 g of protein and 2395 kcals per day) ? ?RD Assessment:  (5/4) ?Estimated Needs ?Total Energy Estimated Needs: 2300-2500 kcal ?Total Protein Estimated Needs: 115-130 grams ?Total Fluid Estimated Needs: >/= 2 L/day ? ? ?Current Nutrition:  ?NPO; NG tube removed by pt on 5/8, diet to start 5/10 and advance as tolerated ?TPN ? ?Plan:  ?Now:  Give extra KCl 10 mEq IV x4 runs replacement for ongoing lasix doses.  ?At 18:00: Continue TPN at 1043mhr (goal rate) ?Electrolytes in TPN:  ?Na 30 mEq/L ?K  6063mL ?Ca 5mE46m ?Mg 8 mEq/L  ?Phos 20 mmol/L  ?Cl:Ac - Max Ac ?Continue standard MVI and trace elements in TPN ?Continue Sensitive SSI q6h and adjust as needed  ?Continue Pepcid '40mg'$  in TPN   ?Monitor TPN labs on Mon/Thurs and PRN ?Hopefully will be able to wean the TPN in the next few days with patient starting a diet ? ? ? ?JustAdrian SaranarmD, BCPS ?Secure Chat if ?s ?03/24/2022 9:04 AM ? ?

## 2022-03-24 NOTE — Plan of Care (Signed)

## 2022-03-24 NOTE — Progress Notes (Signed)
4 Days Post-Op ex lap, ostomy creation and drain placement ?Subjective: ?Seems better today, good UOP with diuresis, still with fevers, wbc normal ?Objective: ?Vital signs in last 24 hours: ?Temp:  [98.7 ?F (37.1 ?C)-103 ?F (39.4 ?C)] 98.7 ?F (37.1 ?C) (05/10 0720) ?Pulse Rate:  [109-169] 116 (05/10 0800) ?Resp:  [21-35] 26 (05/10 0800) ?BP: (120-154)/(59-88) 120/69 (05/10 0800) ?SpO2:  [66 %-98 %] 93 % (05/10 0800) ?FiO2 (%):  [0 %-21 %] 21 % (05/10 0734) ? ? ?Intake/Output from previous day: ?05/09 0701 - 05/10 0700 ?In: 3497.6 [I.V.:2521.2; IV Piggyback:976.3] ?Out: 3220 [URKYH:0623; Drains:350; Stool:220] ?Intake/Output this shift: ?No intake/output data recorded. ? ? ?General appearance: alert and cooperative ?GI: soft, less distended, less TTP ?Pelvis JP with brown fluid ?Sub Q and RP drains with cloudy, brown fluid ?Wound vac in place ?Colostomy viable with stool output and air ? ?Lab Results:  ?Recent Labs  ?  03/23/22 ?0401 03/24/22 ?7628  ?WBC 10.4 7.4  ?HGB 8.1* 7.4*  ?HCT 23.7* 23.2*  ?PLT 402* 354  ? ? ?BMET ?Recent Labs  ?  03/23/22 ?0904 03/24/22 ?0356  ?NA 144 144  ?K 3.5 3.8  ?CL 112* 107  ?CO2 24 26  ?GLUCOSE 127* 130*  ?BUN 45* 42*  ?CREATININE 1.13 1.20  ?CALCIUM 7.6* 7.2*  ? ? ?PT/INR ?No results for input(s): LABPROT, INR in the last 72 hours. ? ?ABG ?No results for input(s): PHART, HCO3 in the last 72 hours. ? ?Invalid input(s): PCO2, PO2 ? ?MEDS, Scheduled ? sodium chloride   Intravenous Once  ? Chlorhexidine Gluconate Cloth  6 each Topical Q0600  ? ferrous sulfate  325 mg Oral TID  ? HYDROmorphone   Intravenous Q4H  ? insulin aspart  0-9 Units Subcutaneous Q4H  ? lip balm  1 application. Topical BID  ? mouth rinse  15 mL Mouth Rinse BID  ? sodium chloride flush  10-40 mL Intracatheter Q12H  ? ? ?Studies/Results: ?No results found. ? ?Assessment: ?s/p  ?Patient Active Problem List  ? Diagnosis Date Noted  ? Sepsis ([place]) 03/18/2022  ? Ileus following gastrointestinal surgery ([place]) 03/18/2022   ? Normal anion gap metabolic acidosis 31/51/7616  ? Anemia 03/18/2022  ? Post-op pain 03/18/2022  ? Acute urinary retention 03/12/2022  ? 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 Mild persistent asthma 03/12/2022  ? Abdominal pain 03/09/2022  ? Rectal perforation ([place]) 03/09/2022  ? Pruritus ani 04/20/2018  ? Arthritis of both knees 01/13/2018  ? Hx of adenomatous polyp of colon 04/07/2017  ? Perianal dermatitis 01/14/2016  ? 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/12/2013  ? History of tobacco use 11/12/2013  ? Low testosterone 09/19/2013  ? Sinusitis 12/21/2012  ? 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 Eczema 10/30/2012  ? Prolapsed internal hemorrhoids, grade 3 10/30/2012  ? Hypertension 04/13/2011  ? [place], [place] 05/14/2010  ? NEOPLASM, SKIN, UNCERTAIN BEHAVIOR 07/37/1062  ? ALLERGIC RHINITIS 08/16/2008  ? Rash and other nonspecific skin eruption 07/02/2008  ? ABNORMAL COAGULATION PROFILE 05/29/2008  ? Gastroesophageal reflux disease 06/28/2007  ? PITYRIASIS ROSEA 06/28/2007  ? OSTEOARTHROSIS, LOCAL NOS, LOWER LEG 06/28/2007  ? Asthma, chronic 05/11/2007  ? ? ?Pelvic sepsis.  Currently drained and debrided.  Bowel function returning ? 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 ?CT 5/5 shows significant retroperitoneal contamination.   ? ?OR findings 5/6: significant RP infection, drained via L pericolic gutter, presacral infection, drained, anterior abd wall infection, drained. ? ? ?  Plan: ?Bowel function returning.  Start clears advance to fulls as tolerated ?Cont TPN until tolerating a reg diet ? ?Cont IV antibiotics x5 more days, Flagyl/Vanc/Ancef per culture results. Blood cultures neg from 5/4.   ?PT/OT consult today ?SCD's ?Perineal dressing changes q shift ?dilaudid PCA for pain control. ?Restart PO meds slowly ?Incentive spirometer Q1h while awake ? ?Can transfer out of stepdown once he remains without fevers for 24h ? ? LOS:  14 days  ? ? ? ?Marland KitchenRosario Adie, MD ?[facility], [place] ? ? ? ?03/24/2022 ?8:30 AM ? ? ? ?  ?

## 2022-03-24 NOTE — Progress Notes (Addendum)
Pharmacy Antibiotic Note ? ?DEREN DEGRAZIA is a 71 y.o. male admitted on 03/09/2022 with cellulitis.  Pharmacy has been consulted for vanc dosing. Ancef also started per Md. Patient was on Zosyn since 5/8 and today is day 16 total abx's. Soft tissue culture grew Ecoli and E.faecalis. Patient with hx of fevers due to [facility] and has had fevers since start of Zosyn so going to try and switch abx's ? ?Plan: ?Based on previous vanc dosing from a few days ago, will give vanc '1500mg'$  IV x 1 then restart vanc 1g IV q24 as previously ordered and monitor closely as patient had increasing SCr while on vanc previously - goal AUC 400-550 ?Ancef 2g IV q8 ? ?Height: '5\' 9"'$  (175.3 cm) ?Weight: 82.2 kg (181 lb 3.5 oz) ?IBW/kg (Calculated) : 70.7 ? ?Temp (24hrs), Avg:100.2 ?F (37.9 ?C), Min:98.7 ?F (37.1 ?C), Max:103 ?F (39.4 ?C) ? ?Recent Labs  ?Lab 03/17/22 ?1849 03/18/22 ?0272 03/20/22 ?1318 03/20/22 ?1339 03/20/22 ?1612 03/20/22 ?2006 03/20/22 ?2342 03/21/22 ?5366 03/21/22 ?4403 03/21/22 ?1548 03/22/22 ?4742 03/23/22 ?0401 03/23/22 ?5956 03/23/22 ?3875 03/24/22 ?6433  ?WBC  --    < > 14.1*  --   --   --   --  11.8*  --   --  13.9* 10.4  --   --  7.4  ?CREATININE  --    < > 1.31*  --   --   --   --   --  1.09  --  1.00  --  QUESTIONABLE RESULTS, RECOMMEND RECOLLECT TO VERIFY 1.13 1.20  ?LATICACIDVEN 1.7  --   --  2.5* 2.9* 2.3* 2.1*  --   --   --   --   --   --   --   --   ?[place]  --   --   --   --   --   --   --   --   --  28*  --   --   --   --   --   ?VANCOPEAK  --   --   --   --   --   --   --  14*  --   --   --   --   --   --   --   ? < > = values in this interval not displayed.  ?  ?Estimated Creatinine Clearance: 58.1 mL/min (by C-G formula based on SCr of 1.2 mg/dL).   ? ?Allergies  ?Allergen Reactions  ? Fluconazole Hives, Rash and Other (See Comments)  ?  Shingles activated ?  ? Griseofulvin Anaphylaxis, Swelling, Rash and Other (See Comments)  ?  Throat swelling ? ?  ? Penicillins Rash and Other (See Comments)  ?   High fever, tolerates Cefepime  ? Sulfa Antibiotics Other (See Comments)  ?  Joints ache, swell and caused inflammation ?  ? Mometasone Furo-Formoterol Fum Other (See Comments)  ?  Lack of therapeutic effect ?  ? Oxycodone Nausea Only and Other (See Comments)  ?  Nauseous to the point of almost vomiting  ? Retapamulin Rash  ? ? ? ?Thank you for allowing pharmacy to be a part of this patient?s care. ? ?Kara Mead ?03/24/2022 8:34 AM ? ?

## 2022-03-24 NOTE — Progress Notes (Addendum)
? ?NAME:  Tyler [name], MRN:  [id number], DOB:  1951-10-06, LOS: 23 ?ADMISSION DATE:  03/09/2022, CONSULTATION DATE:  03/24/22 ?REFERRING MD:  Marcello Moores, CHIEF COMPLAINT:  sepsis  ? ?History of Present Illness:  ?Tyler [name] is a 71 y.o. M with PMH of Asthma, GERD and HTN who underwent a routine transanal hemorrhoidal dearterialization on 4/21.   He experienced worsening abdominal pain, nausea and fever, so presented to the ED on 4/25.   CT scan of the abdomen and pelvis was performed.  This showed thickening of the rectal wall.  He was admitted by surgery and treated conservatively with Cefepime and Flagyl, however continued to have pain and fever, and repeat CT with possible perforation, so was taken to the OR for I&D of the anal canal on 5/1 there was necrosis of the L posterior anal canal which was debrided.   He initially felt better, but over the last 48 hours has continued to be febrile with WBC increasing to 33k.   He reports continued lower abdominal pain and nausea.  BP has been stable, though HR increased to 120-130.  He has been passing gas and blood per rectum and making urine.  Pt was transferred to stepdown and PCCM consulted ? ?Pertinent  Medical History  ? 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 ? ? ?Significant Hospital Events: ?Including procedures, antibiotic start and stop dates in addition to other pertinent events   ?4/21 hemorrhoidal dearterialization ?4/25 admitted to surgery with fever and  pain, thickening of rectal wall  ?5/1 Taken to OR for anal canal I&D and rectal wall necrosis debridement ?5/3 signs of worsening sepsis, transfer to progressive and PCCM consult ?CT abd/pelvis 4/25 Rectal wall thickening with gas tracking into the anterior rectal ?wall, presacral fluid as well as stranding and gas in the mesorectal fat, findings most consistent with rectal perforation. CT abd/pelvis 4/30 1. Significant increase in extraluminal extraperitoneal gas in the abdomen and pelvis from CT 5 days ago as described above. Source of air likely represents rectal perforation. There is generalized increase in stranding and ill-defined fluid associated with this extraluminal air. No peripherally enhancing fluid collection. 2. Left inguinal hernia contains tracking air and small amount of non organized free fluid. ?5/4 HR better end organ fxn stable ?5/6 Has worsening infection on CT scan and patient taken back to OR for diverting colostomy with ostomy creation and abd wash out.  PCCM reconsulted for hypotension.  ?5/6-5/9 PCCM followed for possible evolving sepsis. Has had intermittent fevers however leukocytosis has resolved and clinically improving ? ? ?Interim History / Subjective:  ?Weaned off supplemental oxygen ?Aggressively diuresed in the last 48 hours for volume overload. Improved but remains with +9.5L since admission ?Tmax 103 ?Refusing SCDs ?Objective   ?Blood pressure 127/71, pulse (!) 120, temperature 99 ?F (37.2 ?C), temperature source Oral, resp. rate (!) 27, height '5\' 9"'$  (1.753 m), weight 82.2 kg, SpO2 93 %. ?CVP:  [3 mmHg-8 mmHg] 3 mmHg  ?FiO2 (%):  [0 %-21 %] 21 %  ? ?Intake/Output Summary (Last 24 hours) at 03/24/2022 0746 ?Last data filed at 03/24/2022 0700 ?Gross per 24 hour  ?  Intake 3497.56 ml  ?Output 6695 ml  ?Net -3197.44 ml  ? ?Filed Weights  ? 03/09/22 1410 03/18/22 0800  ?Weight: 77.2 kg 82.2 kg  ? ?Physical Exam: ?General: Chronically ill-appearing, no acute distress, encephalopathic but  improved ?HENT: [place], AT, OP clear, MMM ?Eyes: EOMI, no scleral icterus ?Respiratory: Clear to auscultation bilaterally.  No crackles, wheezing or rales ?Cardiovascular: RRR, -M/R/G, no JVD ?GI: hypoactive BS+, firm but improved, mild TTP, drains x 3, wound vac in place ?Extremities: 2+ pitting edema, +tenderness in LE bilaterally ?Neuro: AAO x1, CNII-XII grossly intact ?GU: Foley in place, severe scrotal edema improved in the last 48 hours ? ?CFB +16L>+12L>9L ?K 3.8 ?BUN/Cr 42/1.2 slightly increasing ?Normal WBC ? ?Bcx 5/3 NGTD ?Would culture 5/6 Few Ecoli, Few enterococcus ? ?Imaging, labs and test noted above have been reviewed independently by me. ? ?Resolved Hospital Problem list   ?Sepsis secondary to abscess - resolved ?AKI secondary to ATN in setting of sepsis ?Acute hypoxemic respiratory failure secondary atelectasis, pain ?NAGMA in context of hyperchloremia ?Assessment & Plan:  ? ?Principal Problem: ?  Rectal perforation ([place]) ?Active Problems: ?  Abdominal pain ?  Acute urinary retention ?  Sepsis ([place]) ?  Ileus following gastrointestinal surgery (HCC) ?  Normal anion gap metabolic acidosis ?  Anemia ?  Post-op pain ? ? ?Anal abscess +Ecoli, Enterococcus ?C/B necrotic anal wall  ?Fevers - will r/o DVT, consider drug-induced fevers with Zosyn ?S/p I&D and debridement on 5/1  ?Diverting ostomy, washout on 5/6 ?Post-op management per Surgery ?NPO for bowel rest. TPN for nutrition ?De-escalate antibiotic to Vanc and Ancef. Surgery added Flagyl ?PCA pump ?Order [place] to rule out DVT for fever ? ?Ileus exacerbated by narcotics ?Plan ?Bowel regimen and bowel rest per Surgery ? ?Anemia ?S/p 2 units PRBCs in OR  ?Plan ?Trend cbc ? ?Volume overload ?Scrotal/extremity edema ?Decrease lasix to 40 mg once ?Trend BMET ?Monitor K and replete as needed ? ?ICU delirium ?Anxiety ?Pain control with PCA above ?IV ativan q8 PRN ? ?No acute critical care needs. Pulmonary will sign off. Updated primary team on  recommendations ? ?Best Practice (right click and "Reselect all SmartList Selections" daily)  ? ?Diet/type: TPN ?DVT prophylaxis: SCD Patient has refused ?GI prophylaxis: N/A ?Lines: yes and it is still needed ?Foley:  Yes, and it is still needed ?Code Status:  full code ?Last date of multidisciplinary goals of care discussion [per primary ] ? ?Critical care time:   ? ?Care Time: 50 min ? ?Rodman Pickle, M.D. ?[facility] ?03/24/2022 8:57 AM  ? ?See Amion for personal pager ?For hours between 7 PM to 7 AM, please call [place] for urgent questions ? ? ? ? ?

## 2022-03-24 NOTE — Progress Notes (Signed)
Nutrition Follow-up ? ?DOCUMENTATION CODES:  ? ?Not applicable ? ?INTERVENTION:  ?- continue Ensure Plus High Protein BID per Surgeon order, each supplement provides 350 kcal and 20 grams of protein. ? ?- TPN per Pharmacist.  ? ?- diet advancement as medically feasible.  ? ?- weigh patient today.  ? ? ?NUTRITION DIAGNOSIS:  ? ?Increased nutrient needs related to acute illness, post-op healing as evidenced by estimated needs. -ongoing ? ?GOAL:  ? ?Patient will meet greater than or equal to 90% of their needs -met with TPN regimen and minimal intake on CLD.  ? ?MONITOR:  ? ?PO intake, Supplement acceptance, Diet advancement, Labs, Weight trends, Other (Comment) (TPN regimen) ? ?REASON FOR ASSESSMENT:  ? ?Consult ?New TPN/TNA ? ?ASSESSMENT:  ? ?71 y.o. male with a past medical history of hypertension, GERD, status post appendectomy, status post inguinal hernia repair, status post transanal hemorrhoid dearterialization (03/05/2022 performed by Dr. Marcello Moores).  Presents to the emergency department with a complaint of abdominal pain, nausea, vomiting, and fever. ? ?Significant Events: ?4/25- admission ?4/26- diet changed from NPO to NPO with sips of clears allowed ?4/28- initial RD assessment; double lumen PICC placed in R brachial; TPN initiation ?5/1- anal exam with debridement, I&D of anal canal, proctoscopy d/t pelvic infection--found to have necrosis of anoderm and large R gluteal abscess which was opened and drained ?5/6- diagnostic ex lap, abdominal washout and drain placement, diverting colostomy, anal exam ? ? ?He is POD #9 and #3. Diet advanced to CLD today at 0844. Able to discuss with RN at bedside.  ? ?He is receiving TPN at goal rate of 100 ml/hr which is providing 2395 kcal and 125 grams protein. Plan is for continuation of TPN until tolerating Regular diet. ? ?Patient laying in bed with no visitors present at the time of RD visit. Patient had eaten ~50% of jello, consumed 100% of grape juice, and consumed  ~50-75% of ginger ale. He reported that the chicken broth was too salty.  ? ?He denies any overt abdominal discomfort.  ? ?He has not been weighed since 5/4. Re-estimated needs today d/t surgery on 5/6. TPN regimen meeting 100% re-estimated needs.  ? ?Non-pitting edema to BUE, mild pitting edema to BLE, and deep pitting edema to perineal area/scrotum.  ? ? ?Labs reviewed; CBGs: 111, 136, 142 mg/dl, BUN: 42 mg/dl, Ca: 7.2 mg/dl.  ? ?Medications reviewed; 325 mg ferrous sulfate TID, 40 mg IV lasix x1 dose 5/9 and x1 dose 5/10, sliding scale novolog, 10 mEq IV KCl x8 runs 5/9. ? ?  ? ?NUTRITION - FOCUSED PHYSICAL EXAM: ? ?Completed on 4/28 and no depletions at that time. ? ?Diet Order:   ?Diet Order   ? ?       ?  Diet clear liquid Room service appropriate? Yes; Fluid consistency: Thin  Diet effective now       ?  ? ?  ?  ? ?  ? ? ?EDUCATION NEEDS:  ? ?Education needs have been addressed ? ?Skin:  Skin Assessment: Skin Integrity Issues: ?Skin Integrity Issues:: Incisions ?Incisions: rectum (5/1) and abdomen (5/6) ? ?Last BM:  5/10 (420 ml via colostomy) ? ?Height:  ? ?Ht Readings from Last 1 Encounters:  ?03/18/22 '5\' 9"'  (1.753 m)  ? ? ?Weight:  ? ?Wt Readings from Last 1 Encounters:  ?03/18/22 82.2 kg  ? ? ?BMI:  Body mass index is 26.76 kg/m?. ? ?Estimated Nutritional Needs:  ?Kcal:  2400-2700 kcal ?Protein:  125-140 grams ?Fluid:  >/= 2.5  L/day ? ? ? ? ? ?Jarome Matin, MS, RD, LDN ?Registered Dietitian II ?Inpatient Clinical Nutrition ?RD pager # and on-call/weekend pager # available in [place]  ? ?

## 2022-03-24 NOTE — Progress Notes (Signed)
Bilateral upper and lower extremity venous duplexes has been completed. ?Preliminary results can be found in CV Proc through chart review.  ?Results were given to the patient's nurse, Lauren. ? ?03/24/22 10:42 AM ?Tyler [name] RVT   ?

## 2022-03-25 ENCOUNTER — Inpatient Hospital Stay (HOSPITAL_COMMUNITY): Payer: BC Managed Care – PPO

## 2022-03-25 DIAGNOSIS — M7989 Other specified soft tissue disorders: Secondary | ICD-10-CM

## 2022-03-25 LAB — COMPREHENSIVE METABOLIC PANEL
ALT: 24 U/L (ref 0–44)
AST: 28 U/L (ref 15–41)
Albumin: <1.5 g/dL — ABNORMAL LOW (ref 3.5–5.0)
Alkaline Phosphatase: 156 U/L — ABNORMAL HIGH (ref 38–126)
Anion gap: 6 (ref 5–15)
BUN: 41 mg/dL — ABNORMAL HIGH (ref 8–23)
CO2: 25 mmol/L (ref 22–32)
Calcium: 7.2 mg/dL — ABNORMAL LOW (ref 8.9–10.3)
Chloride: 107 mmol/L (ref 98–111)
Creatinine, Ser: 1.17 mg/dL (ref 0.61–1.24)
GFR, Estimated: >60 mL/min (ref >60)
Glucose, Bld: 121 mg/dL — ABNORMAL HIGH (ref 70–99)
Potassium: 3.7 mmol/L (ref 3.5–5.1)
Sodium: 138 mmol/L (ref 135–145)
Total Bilirubin: 0.7 mg/dL (ref 0.3–1.2)
Total Protein: 5.1 g/dL — ABNORMAL LOW (ref 6.5–8.1)

## 2022-03-25 LAB — GLUCOSE, CAPILLARY
Glucose-Capillary: 103 mg/dL — ABNORMAL HIGH (ref 70–99)
Glucose-Capillary: 119 mg/dL — ABNORMAL HIGH (ref 70–99)
Glucose-Capillary: 132 mg/dL — ABNORMAL HIGH (ref 70–99)
Glucose-Capillary: 106 mg/dL — ABNORMAL HIGH (ref 70–99)
Glucose-Capillary: 131 mg/dL — ABNORMAL HIGH (ref 70–99)

## 2022-03-25 LAB — CBC
HCT: 21.9 % — ABNORMAL LOW (ref 39.0–52.0)
Hemoglobin: 7.2 g/dL — ABNORMAL LOW (ref 13.0–17.0)
MCH: 32.9 pg (ref 26.0–34.0)
MCHC: 32.9 g/dL (ref 30.0–36.0)
MCV: 100 fL (ref 80.0–100.0)
Platelets: 327 10*3/uL (ref 150–400)
RBC: 2.19 MIL/uL — ABNORMAL LOW (ref 4.22–5.81)
RDW: 14.9 % (ref 11.5–15.5)
WBC: 8.9 10*3/uL (ref 4.0–10.5)
nRBC: 0 % (ref 0.0–0.2)

## 2022-03-25 LAB — TRIGLYCERIDES: Triglycerides: 105 mg/dL (ref <150)

## 2022-03-25 LAB — AEROBIC/ANAEROBIC CULTURE W GRAM STAIN (SURGICAL/DEEP WOUND): Gram Stain: NONE SEEN

## 2022-03-25 MED ORDER — TRAVASOL 10 % IV SOLN
INTRAVENOUS | Status: AC
  Filled 2022-03-25: qty 1248

## 2022-03-25 NOTE — Progress Notes (Addendum)
5 Days Post-Op ex lap, ostomy creation and drain placement ?Subjective: ?Overall feels better each day, albeit slowly. +Fevers noted by Dr. Marcello Moores daily now for many days. Wbc normal ?Objective: ?Vital signs in last 24 hours: ?Temp:  [98.6 ?F (37 ?C)-102.3 ?F (39.1 ?C)] 102.3 ?F (39.1 ?C) (05/11 1610) ?Pulse Rate:  [99-140] 128 (05/11 0700) ?Resp:  [24-39] 34 (05/11 0700) ?BP: (119-161)/(63-89) 149/67 (05/11 0700) ?SpO2:  [92 %-100 %] 95 % (05/11 0700) ?FiO2 (%):  [21 %] 21 % (05/11 0400) ?Weight:  [73 kg] 73 kg (05/10 1208) ? ? ?Intake/Output from previous day: ?05/10 0701 - 05/11 0700 ?In: 4588.3 [P.O.:1370; I.V.:2302.4; IV Piggyback:915.9] ?Out: 9604 [Urine:3400; Drains:375; Stool:850] ?Intake/Output this shift: ?No intake/output data recorded. ? ? 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 ?Lab Results:  ?Recent Labs  ?  03/24/22 ?0356 03/25/22 ?5409  ?WBC 7.4 8.9  ?HGB 7.4* 7.2*  ?HCT 23.2* 21.9*  ?PLT 354 327  ? ?BMET ?Recent Labs  ?  03/24/22 ?0356 03/25/22 ?0353  ?NA 144 138  ?K 3.8 3.7  ?CL 107 107  ?CO2 26 25  ?GLUCOSE 130* 121*  ?BUN 42* 41*  ?CREATININE 1.20 1.17  ?CALCIUM 7.2* 7.2*  ? ?PT/INR ?No results for input(s): LABPROT, INR in the last 72 hours. ? ?ABG ?No results for input(s): PHART, HCO3 in the last 72 hours. ? ?Invalid input(s): PCO2, PO2 ? ?MEDS, Scheduled ? sodium chloride   Intravenous Once  ? acetaminophen  1,000 mg Oral Q6H  ? Chlorhexidine Gluconate Cloth  6 each Topical Q0600  ? feeding supplement  237 mL Oral BID BM  ? feeding supplement  237 mL Oral BID BM  ? ferrous sulfate  325 mg Oral TID  ? HYDROmorphone   Intravenous Q4H  ? insulin aspart  0-9 Units Subcutaneous Q4H  ? lip balm  1 application. Topical BID  ? mouth rinse  15 mL Mouth Rinse BID  ? sodium chloride flush   10-40 mL Intracatheter Q12H  ? ? ?Studies/Results: ?VAS [place] LOWER EXTREMITY VENOUS (DVT) ? ?Result Date: 03/24/2022 ? Lower Venous DVT Study Patient Name:  Tyler [name]  Date of Exam:   03/24/2022 Medical Rec #: [id number]          Accession #:    [id number] Date of Birth: June 30, 1951          Patient Gender: M Patient Age:   71 years Exam Location:  [facility] Procedure:      VAS [place] LOWER EXTREMITY VENOUS (DVT) Referring Phys: CHI ELLISON --------------------------------------------------------------------------------  Indications: Swelling.  Risk Factors: None identified. Limitations: Poor ultrasound/tissue interface and patient positioning, patient pain tolerance. Comparison Study: No prior studies. Performing Technologist: Oliver Hum RVT  Examination Guidelines: A complete evaluation includes B-mode imaging, spectral Doppler, color Doppler, and power Doppler as needed of all accessible portions of each vessel. Bilateral testing is considered an integral part of a complete examination. Limited examinations for reoccurring indications may be performed as noted. The reflux portion of the exam is performed with the patient in reverse Trendelenburg.  +---------+---------------+---------+-----------+----------+--------------+ RIGHT    CompressibilityPhasicitySpontaneityPropertiesThrombus Aging +---------+---------------+---------+-----------+----------+--------------+ CFV      Full           Yes      Yes                                 +---------+---------------+---------+-----------+----------+--------------+  SFJ      Full                                                        +---------+---------------+---------+-----------+----------+--------------+ FV Prox  Full                                                        +---------+---------------+---------+-----------+----------+--------------+ FV Mid   Full                                                         +---------+---------------+---------+-----------+----------+--------------+ FV DistalFull                                                        +---------+---------------+---------+-----------+----------+--------------+ PFV      Full                                                        +---------+---------------+---------+-----------+----------+--------------+ POP      Full           Yes      Yes                                 +---------+---------------+---------+-----------+----------+--------------+ PTV      Full                                                        +---------+---------------+---------+-----------+----------+--------------+ PERO     Full                                                        +---------+---------------+---------+-----------+----------+--------------+   +---------+---------------+---------+-----------+----------+-------------------+ LEFT     CompressibilityPhasicitySpontaneityPropertiesThrombus Aging      +---------+---------------+---------+-----------+----------+-------------------+ CFV                     Yes      Yes                                      +---------+---------------+---------+-----------+----------+-------------------+ FV Prox                 Yes      Yes                                      +---------+---------------+---------+-----------+----------+-------------------+  FV Mid                  Yes      Yes                                      +---------+---------------+---------+-----------+----------+-------------------+ FV Distal               Yes      Yes                                      +---------+---------------+---------+-----------+----------+-------------------+ PFV                                                   Not well visualized +---------+---------------+---------+-----------+----------+-------------------+ POP      Full           Yes      Yes                                       +---------+---------------+---------+-----------+----------+-------------------+ PTV      Full                                                             +---------+---------------+---------+-----------+----------+-------------------+ PERO     Full                                                             +---------+---------------+---------+-----------+----------+-------------------+     Summary: RIGHT: - There is no evidence of deep vein thrombosis in the lower extremity. However, portions of this examination were limited- see technologist comments above.  - No cystic structure found in the popliteal fossa.  LEFT: - There is no evidence of deep vein thrombosis in the lower extremity. However, portions of this examination were limited- see technologist comments above.  - No cystic structure found in the popliteal fossa.  *See table(s) above for measurements and observations. Electronically signed by Orlie Pollen on 03/24/2022 at 9:19:55 PM.    Final   ? ?VAS [place] UPPER EXTREMITY VENOUS DUPLEX ? ?Result Date: 03/24/2022 ?UPPER VENOUS STUDY  Patient Name:  Tyler [name]  Date of Exam:   03/24/2022 Medical Rec #: [id number]          Accession #:    [id number] Date of Birth: 26-Sep-1951          Patient Gender: M Patient Age:   71 years Exam Location:  [facility] Procedure:      VAS [place] UPPER EXTREMITY VENOUS DUPLEX Referring Phys: CHI ELLISON --------------------------------------------------------------------------------  Indications: Swelling Risk Factors: None identified. Limitations: Poor ultrasound/tissue interface, bandages, line and patient positioning, patient pain tolerance. Comparison Study: No prior studies. Performing Technologist: Oliver Hum RVT  Examination Guidelines: A complete evaluation includes B-mode imaging, spectral Doppler, color Doppler, and power Doppler as needed of all accessible portions of each vessel. Bilateral testing is considered an  integral part of a complete examination. Limited examinations for reoccurring indications may be performed as noted.  Right Findings: +----------+------------+---------+-----------+----------+-------+ RIGHT     CompressiblePhasicitySpontaneousPropertiesSummary +----------+------------+---------+-----------+----------+-------+ IJV           Fu

## 2022-03-25 NOTE — Consult Note (Addendum)
[place] Nurse wound follow up ?Patient receiving care in [facility] ICU 1223. Primary RN at bedside during care provisions. ?Wound type: Surgical ?Measurement: measured two days ago ?Wound bed: When I removed the strip of VAC foam, the negative pressure had pulled the wound together to give an appearance of being healed over. With very gentle traction and use of a cotton tipped applicator, the wound opened up to reveal a wound that is covered in yellow/tan non-granulation tissue. ?Drainage (amount, consistency, odor) no odor ?Periwound: impacted by MARSI. ?Dressing procedure/placement/frequency: ?1.5 rolled, saline moistened 4 x 4 gauzes into the wound, cover with dry 4 x 4 gauzes.  Also, I crusted the MARSI impacted periwound tissues using stoma powder and Cavilon No Sting Barrier Film. ? ?I also entered an order to crust with powder and film barrier around the drain tubes and ostomy. ? ?I did communicate with Dr. Deland Pretty prior to my seeing the patient, that I was concerned about the wound on Tuesday, and my plans to remove the [facility] today.   ? ?[place] Nurse ostomy follow up ?The peristomal ostomy was crusted with stoma powder and film barrier. A new pouching system was placed. ? ?The patient is not in any condition to be taught ostomy care at this time. ? ?Val Riles, RN, MSN, CWOCN, CNS-BC, pager [phone]  ?

## 2022-03-25 NOTE — Progress Notes (Signed)
Stat lower extremity venous left study completed. ? ?Preliminary results relayed to RN at bedside as well as Dema Severin, MD. ? ?See CV Proc for preliminary results report.  ? ?Darlin Coco, RDMS, RVT ? ?

## 2022-03-25 NOTE — Progress Notes (Signed)
OT Cancellation Note ? ?Patient Details ?Name: MIGEL HANNIS ?MRN: [id number] ?DOB: 1950/12/05 ? ? ?Cancelled Treatment:    Reason Eval/Treat Not Completed: Medical issues which prohibited therapy. Patient with high fever, red MEWs. Will check back as schedule permits. ? ?Delbert Phenix OT ?OT pager: [phone] ? ? ?Rosemary Holms ?03/25/2022, 7:50 AM ?

## 2022-03-25 NOTE — Progress Notes (Signed)
PT Cancellation Note ? ?Patient Details ?Name: Tyler [name] ?MRN: [id number] ?DOB: 03-Mar-1951 ? ? ?Cancelled Treatment:    Reason Eval/Treat Not Completed: Medical issues which prohibited therapy. Will check back another time. ?Tresa Endo PT ?Acute Rehabilitation Services ?Pager [phone] ?Office [phone] ? ? ? ?Tyler [name], Tyler [name] ?03/25/2022, 10:11 AM ?

## 2022-03-25 NOTE — Progress Notes (Signed)
PHARMACY - TOTAL PARENTERAL NUTRITION CONSULT NOTE  ? ?Indication: rectal perforation, prolonged NPO ? ?Patient Measurements: ?Height: '5\' 9"'$  (175.3 cm) ?Weight: 73 kg (160 lb 15 oz) ?IBW/kg (Calculated) : 70.7 ?TPN AdjBW (KG): 82.2 ?Body mass index is 23.77 kg/m?. ? ?Assessment: 71 yo male with history of rectal bleeding and prolapsed internal hemorrhoids who recently underwent hemorrhoid surgery w/ hemorrhoidal dearterialization on 03/05/22.  He presented to [facility] on 4/25 with increasing abdominal pain, nausea, and fever.  CT abdomen/pelvis with thickening of rectal wall; gas and fluid within the rectal wall and in presacral space concerning for rectal perforation.  Pharmacy has been consulted to manage TPN as patient unable to tolerate PO. ? ?Glucose / Insulin: no history of DM, CBGs at goal < 150 ?- 4 units insulin aspart given in past 24 hours  ?- Continue q6h monitoring of CBGs ?Electrolytes:   stable ?Renal: stable - Scr ~1.1-1.2 ?Hepatic: AST/ALT, Tbili WNL.  Albumin <1.5, TG 116 ?Intake / Output: Net I/O Admission +5L  ?GI Imaging: ?- 4/25 CTAP: rectal wall thickening w/ gas tracking into the anterior rectal wall, presacral fluid as well as stranding and gas in the mesorectal fat; consistent w/ rectal perforation ?- 4/30 CT scan: extraluminal air tracking into mesenteric space but no free peritoneal fluid   ?- 5/5 CT: Progressive Necrotizing Infection, ileus.  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 ?Central access: PICC placed 4/28 ?TPN start date: 03/12/22 ? ?Nutritional Goals: ?Goal TPN rate is 100 mL/hr (provides 125 g of protein and 2395 kcals per day) ? ?RD Assessment:  (5/10) ?Estimated Needs ?Total Energy Estimated Needs: 2400-2700 kcal ?Total Protein Estimated Needs: 125-140 grams ?Total Fluid Estimated  Needs: >/= 2.5 L/day ? ? ?Current Nutrition:  ?TPN ?Diet advanced to FLD today ? ?Plan:  ?Diet advanced to FLD - continue to follow advancement for appropriate taper off TPN ?Continue TPN at 117m/hr (goal rate) ?Electrolytes in TPN:  ?Na 30 mEq/L ?K  6541m/L ?Ca 41m71mL ?Mg 8 mEq/L  ?Phos 20 mmol/L  ?Cl:Ac 1:1 ?Continue standard MVI and trace elements in TPN ?Continue Sensitive SSI q6h and adjust as needed  ?Continue Pepcid '40mg'$  in TPN   ?Monitor TPN labs on Mon/Thurs and PRN ?Hopefully will be able to wean the TPN in the next few days with patient starting a diet ? ? ?AbbTawnya CrookharmD, BCPS ?Clinical Pharmacist ?03/25/2022 11:10 AM ? ? ?

## 2022-03-26 ENCOUNTER — Inpatient Hospital Stay (HOSPITAL_COMMUNITY): Payer: BC Managed Care – PPO

## 2022-03-26 ENCOUNTER — Encounter (HOSPITAL_COMMUNITY): Payer: Self-pay | Admitting: General Surgery

## 2022-03-26 LAB — CBC
HCT: 20.9 % — ABNORMAL LOW (ref 39.0–52.0)
Hemoglobin: 7 g/dL — ABNORMAL LOW (ref 13.0–17.0)
MCH: 33.3 pg (ref 26.0–34.0)
MCHC: 33.5 g/dL (ref 30.0–36.0)
MCV: 99.5 fL (ref 80.0–100.0)
Platelets: 314 10*3/uL (ref 150–400)
RBC: 2.1 MIL/uL — ABNORMAL LOW (ref 4.22–5.81)
RDW: 14.3 % (ref 11.5–15.5)
WBC: 11.1 10*3/uL — ABNORMAL HIGH (ref 4.0–10.5)
nRBC: 0 % (ref 0.0–0.2)

## 2022-03-26 LAB — BASIC METABOLIC PANEL
Anion gap: 5 (ref 5–15)
BUN: 40 mg/dL — ABNORMAL HIGH (ref 8–23)
CO2: 24 mmol/L (ref 22–32)
Calcium: 7.3 mg/dL — ABNORMAL LOW (ref 8.9–10.3)
Chloride: 107 mmol/L (ref 98–111)
Creatinine, Ser: 1.15 mg/dL (ref 0.61–1.24)
GFR, Estimated: >60 mL/min (ref >60)
Glucose, Bld: 125 mg/dL — ABNORMAL HIGH (ref 70–99)
Potassium: 3.8 mmol/L (ref 3.5–5.1)
Sodium: 136 mmol/L (ref 135–145)

## 2022-03-26 LAB — GLUCOSE, CAPILLARY
Glucose-Capillary: 102 mg/dL — ABNORMAL HIGH (ref 70–99)
Glucose-Capillary: 128 mg/dL — ABNORMAL HIGH (ref 70–99)
Glucose-Capillary: 95 mg/dL (ref 70–99)

## 2022-03-26 LAB — HEMOGLOBIN AND HEMATOCRIT, BLOOD
HCT: 25.1 % — ABNORMAL LOW (ref 39.0–52.0)
Hemoglobin: 8 g/dL — ABNORMAL LOW (ref 13.0–17.0)

## 2022-03-26 LAB — PREPARE RBC (CROSSMATCH)

## 2022-03-26 MED ORDER — SODIUM CHLORIDE 0.9% IV SOLUTION: Freq: Once | INTRAVENOUS | Status: AC

## 2022-03-26 MED ORDER — SODIUM CHLORIDE (PF) 0.9 % IJ SOLN
INTRAMUSCULAR | Status: AC
  Filled 2022-03-26: qty 50

## 2022-03-26 MED ORDER — FAMOTIDINE 20 MG PO TABS
20.0000 mg | ORAL_TABLET | Freq: Two times a day (BID) | ORAL | Status: DC
  Administered 2022-03-26 – 2022-04-09 (×28): 20 mg via ORAL
  Filled 2022-03-26 (×28): qty 1

## 2022-03-26 MED ORDER — ADULT MULTIVITAMIN W/MINERALS CH
1.0000 | ORAL_TABLET | Freq: Every day | ORAL | Status: DC
  Administered 2022-03-26 – 2022-04-09 (×15): 1 via ORAL
  Filled 2022-03-26 (×15): qty 1

## 2022-03-26 MED ORDER — IOHEXOL 9 MG/ML PO SOLN
ORAL | Status: AC
  Filled 2022-03-26: qty 1000

## 2022-03-26 MED ORDER — IOHEXOL 300 MG/ML  SOLN
100.0000 mL | Freq: Once | INTRAMUSCULAR | Status: AC | PRN
  Administered 2022-03-26: 100 mL via INTRAVENOUS

## 2022-03-26 MED ORDER — IOHEXOL 9 MG/ML PO SOLN
1000.0000 mL | ORAL | Status: AC
  Administered 2022-03-26: 500 mL via ORAL

## 2022-03-26 MED ORDER — TRAVASOL 10 % IV SOLN
INTRAVENOUS | Status: AC
  Filled 2022-03-26: qty 1248

## 2022-03-26 NOTE — Progress Notes (Signed)
PT Cancellation Note ? ?Patient Details ?Name: Tyler [name] ?MRN: [id number] ?DOB: 04-17-1951 ? ? ?Cancelled Treatment:    Reason Eval/Treat Not Completed: Medical issues which prohibited therapy, just startin blood. Will check back tomorrow. ?Tresa Endo PT ?Acute Rehabilitation Services ?Pager [phone] ?Office [phone] ? ? ? ?Mycheal Veldhuizen, Shella Maxim ?03/26/2022, 1:30 PM ?

## 2022-03-26 NOTE — Progress Notes (Signed)
OT Cancellation Note ? ?Patient Details ?Name: Tyler [name] ?MRN: [id number] ?DOB: 08-14-1951 ? ? ?Cancelled Treatment:    Reason Eval/Treat Not Completed: Medical issues which prohibited therapy Patient back from CT however blood transfusion just starting. Will check back 5/13 as schedule permits. ? ?Delbert Phenix OT ?OT pager: [phone] ? ? ?Rosemary Holms ?03/26/2022, 1:40 PM ?

## 2022-03-26 NOTE — Progress Notes (Addendum)
6 Days Post-Op ex lap, ostomy creation and drain placement ?Subjective: ?Overall feels better each day, albeit slowly. +Fevers noted by Dr. Marcello Moores daily now for many days. Wbc bumped to 11 ? ?Objective: ?Vital signs in last 24 hours: ?Temp:  [98.2 ?F (36.8 ?C)-102.2 ?F (39 ?C)] 98.2 ?F (36.8 ?C) (05/12 0400) ?Pulse Rate:  [110-137] 119 (05/12 0600) ?Resp:  [19-42] 27 (05/12 0755) ?BP: (115-161)/(65-89) 142/89 (05/12 0600) ?SpO2:  [92 %-98 %] 96 % (05/12 0755) ?FiO2 (%):  [0 %-21 %] 0 % (05/12 0400) ?Weight:  [88.6 kg] 88.6 kg (05/12 0400) ? ? ?Intake/Output from previous day: ?05/11 0701 - 05/12 0700 ?In: 4120.5 [P.O.:990; I.V.:2442.3; IV Piggyback:688.2] ?Out: 1250 [Urine:550; Drains:275; Stool:425] ?Intake/Output this shift: ?No intake/output data recorded. ? ? ?General appearance: alert and cooperative; drinking oral contrast ?GI: soft, appropriate tenderness/distention; ostomy pink and productive ?Pelvis JP with brown fluid. Pfannenstiel incision clean, base with some drainage but not copious, similar to drain effluent. No blanching expansile erythema ?Sub Q and RP drains with cloudy, brown fluid ?Wound vac in place ?Colostomy viable with stool output and air ? ?Lab Results:  ?Recent Labs  ?  03/25/22 ?5462 03/26/22 ?0222  ?WBC 8.9 11.1*  ?HGB 7.2* 7.0*  ?HCT 21.9* 20.9*  ?PLT 327 314  ? ?BMET ?Recent Labs  ?  03/25/22 ?0353 03/26/22 ?0222  ?NA 138 136  ?K 3.7 3.8  ?CL 107 107  ?CO2 25 24  ?GLUCOSE 121* 125*  ?BUN 41* 40*  ?CREATININE 1.17 1.15  ?CALCIUM 7.2* 7.3*  ? ?PT/INR ?No results for input(s): LABPROT, INR in the last 72 hours. ? ?ABG ?No results for input(s): PHART, HCO3 in the last 72 hours. ? ?Invalid input(s): PCO2, PO2 ? ?MEDS, Scheduled ? sodium chloride   Intravenous Once  ? sodium chloride   Intravenous Once  ? acetaminophen  1,000 mg Oral Q6H  ? Chlorhexidine Gluconate Cloth  6 each Topical Q0600  ? feeding supplement  237 mL Oral BID BM  ? feeding supplement  237 mL Oral BID BM  ? ferrous  sulfate  325 mg Oral TID  ? HYDROmorphone   Intravenous Q4H  ? insulin aspart  0-9 Units Subcutaneous Q4H  ? iohexol      ? lip balm  1 application. Topical BID  ? mouth rinse  15 mL Mouth Rinse BID  ? sodium chloride flush  10-40 mL Intracatheter Q12H  ? ? ?Studies/Results: ?VAS [place] LOWER EXTREMITY VENOUS (DVT) ? ?Result Date: 03/25/2022 ? Lower Venous DVT Study Patient Name:  Tyler [name]  Date of Exam:   03/25/2022 Medical Rec #: [id number]          Accession #:    [id number] Date of Birth: 21-Mar-1951          Patient Gender: M Patient Age:   3 years Exam Location:  [facility] Procedure:      VAS [place] LOWER EXTREMITY VENOUS (DVT) Referring Phys: Leighton Ruff --------------------------------------------------------------------------------  Indications: Swelling.  Risk Factors: None identified. Limitations: Patient pain tolerance. Comparison Study: 03-24-2022 Bilateral upper and lower extremity venous studies                   were negative for DVT. Performing Technologist: Darlin Coco RDMS, RVT  Examination Guidelines: A complete evaluation includes B-mode imaging, spectral Doppler, color Doppler, and power Doppler as needed of all accessible portions of each vessel. Bilateral testing is considered an integral part of a complete examination. Limited examinations for reoccurring indications  may be performed as noted. The reflux portion of the exam is performed with the patient in reverse Trendelenburg.  +-----+---------------+---------+-----------+----------+--------------+ RIGHTCompressibilityPhasicitySpontaneityPropertiesThrombus Aging +-----+---------------+---------+-----------+----------+--------------+ CFV  Full           Yes      Yes                                 +-----+---------------+---------+-----------+----------+--------------+   +---------+---------------+---------+-----------+----------+--------------+ LEFT     CompressibilityPhasicitySpontaneityPropertiesThrombus  Aging +---------+---------------+---------+-----------+----------+--------------+ CFV      Full           Yes      Yes                                 +---------+---------------+---------+-----------+----------+--------------+ SFJ      Full                                                        +---------+---------------+---------+-----------+----------+--------------+ FV Prox  Full                                                        +---------+---------------+---------+-----------+----------+--------------+ FV Mid   Full                                                        +---------+---------------+---------+-----------+----------+--------------+ FV DistalFull                                                        +---------+---------------+---------+-----------+----------+--------------+ PFV      Full                                                        +---------+---------------+---------+-----------+----------+--------------+ POP      Full           Yes      Yes                                 +---------+---------------+---------+-----------+----------+--------------+ PTV      Full                                                        +---------+---------------+---------+-----------+----------+--------------+ PERO     Full                                                        +---------+---------------+---------+-----------+----------+--------------+  Gastroc  Full                                                        +---------+---------------+---------+-----------+----------+--------------+     Summary: RIGHT: - No evidence of deep vein thrombosis in the lower extremity. No indirect evidence of obstruction proximal to the inguinal ligament.  LEFT: - Findings appear essentially unchanged compared to previous examination performed yesterday.  - There is no evidence of deep vein thrombosis in the lower extremity.  - No cystic  structure found in the popliteal fossa.  *See table(s) above for measurements and observations.    Preliminary   ? ?VAS [place] LOWER EXTREMITY VENOUS (DVT) ? ?Result Date: 03/24/2022 ? Lower Venous DVT Study Patient Name:  Tyler [name]  Date of Exam:   03/24/2022 Medical Rec #: [id number]          Accession #:    [id number] Date of Birth: [birth year]          Patient Gender: M Patient Age:   71 years Exam Location:  [facility] Procedure:      VAS [place] LOWER EXTREMITY VENOUS (DVT) Referring Phys: CHI ELLISON --------------------------------------------------------------------------------  Indications: Swelling.  Risk Factors: None identified. Limitations: Poor ultrasound/tissue interface and patient positioning, patient pain tolerance. Comparison Study: No prior studies. Performing Technologist: Oliver Hum RVT  Examination Guidelines: A complete evaluation includes B-mode imaging, spectral Doppler, color Doppler, and power Doppler as needed of all accessible portions of each vessel. Bilateral testing is considered an integral part of a complete examination. Limited examinations for reoccurring indications may be performed as noted. The reflux portion of the exam is performed with the patient in reverse Trendelenburg.  +---------+---------------+---------+-----------+----------+--------------+ RIGHT    CompressibilityPhasicitySpontaneityPropertiesThrombus Aging +---------+---------------+---------+-----------+----------+--------------+ CFV      Full           Yes      Yes                                 +---------+---------------+---------+-----------+----------+--------------+ SFJ      Full                                                        +---------+---------------+---------+-----------+----------+--------------+ FV Prox  Full                                                        +---------+---------------+---------+-----------+----------+--------------+ FV Mid   Full                                                         +---------+---------------+---------+-----------+----------+--------------+ FV DistalFull                                                        +---------+---------------+---------+-----------+----------+--------------+  PFV      Full

## 2022-03-26 NOTE — Progress Notes (Addendum)
PHARMACY - TOTAL PARENTERAL NUTRITION CONSULT NOTE  ? ?Indication: rectal perforation, prolonged NPO ? ?Patient Measurements: ?Height: '5\' 9"'  (175.3 cm) ?Weight: 88.6 kg (195 lb 5.2 oz) ?IBW/kg (Calculated) : 70.7 ?TPN AdjBW (KG): 82.2 ?Body mass index is 28.84 kg/m?. ? ?Assessment: 71 yo male with history of rectal bleeding and prolapsed internal hemorrhoids who recently underwent hemorrhoid surgery w/ hemorrhoidal dearterialization on 03/05/22.  He presented to [facility] on 4/25 with increasing abdominal pain, nausea, and fever.  CT abdomen/pelvis with thickening of rectal wall; gas and fluid within the rectal wall and in presacral space concerning for rectal perforation.  Pharmacy has been consulted to manage TPN as patient unable to tolerate PO. ? ?Glucose / Insulin: no history of DM, CBGs at goal < 150 ?- 2 units insulin aspart given in past 24 hours  ?-DC CBGs & DC SSI 5/12 ?Electrolytes:   stable ?Renal: stable - Scr 1.15 ?Hepatic: AST/ALT, Tbili WNL.  Albumin <1.5, TG 105, alk phos 156 ?Intake / Output: I/O + 2870, 425 ml stool. 275 ml drain OP ?GI Imaging: ?- 4/25 CTAP: rectal wall thickening w/ gas tracking into the anterior rectal wall, presacral fluid as well as stranding and gas in the mesorectal fat; consistent w/ rectal perforation ?- 4/30 CT scan: extraluminal air tracking into mesenteric space but no free peritoneal fluid   ?- 5/5 CT: Progressive Necrotizing Infection, ileus.  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 ?Central access: PICC placed 4/28 ?TPN start date: 03/12/22 ? ?Nutritional Goals: ?Goal TPN rate is 100 mL/hr (provides 125 g of protein and 2395 kcals per day) ? ?RD Assessment:  (5/10) ?Estimated Needs ?Total Energy Estimated Needs: 2400-2700 kcal ?Total Protein Estimated Needs:  125-140 grams ?Total Fluid Estimated Needs: >/= 2.5 L/day ? ? ?Current Nutrition:  ?TPN ?Diet advanced to soft today ? ?Plan:  ?Diet advanced to soft - continue to follow advancement for appropriate taper off TPN ?Continue TPN at 1727m/hr (goal rate) ?Electrolytes in TPN:  ?Na 30 mEq/L ?K  649m/L ?Ca 27m86mL ?Mg 8 mEq/L  ?Phos 20 mmol/L  ?Cl:Ac 1:1 ?MVI to PO ?DC SSI & CBGs ? Pepcid 92m20m TPN> change to 20 po bid   ?Monitor TPN labs on Mon/Thurs and PRN ?Hopefully will be able to wean the TPN in the next few days with patient advancing diet ? ? ?BellEudelia BuncharmD ?Clinical Pharmacist ?03/26/2022 9:33 AM ? ? ?

## 2022-03-27 ENCOUNTER — Inpatient Hospital Stay (HOSPITAL_COMMUNITY): Payer: BC Managed Care – PPO

## 2022-03-27 LAB — TYPE AND SCREEN
ABO/RH(D): O POS
Antibody Screen: NEGATIVE
Unit division: 0

## 2022-03-27 LAB — BPAM RBC
Blood Product Expiration Date: 202306122359
ISSUE DATE / TIME: 202305121315
Unit Type and Rh: 5100

## 2022-03-27 LAB — CBC
HCT: 23.7 % — ABNORMAL LOW (ref 39.0–52.0)
Hemoglobin: 7.7 g/dL — ABNORMAL LOW (ref 13.0–17.0)
MCH: 34.1 pg — ABNORMAL HIGH (ref 26.0–34.0)
MCHC: 32.5 g/dL (ref 30.0–36.0)
MCV: 104.9 fL — ABNORMAL HIGH (ref 80.0–100.0)
Platelets: 295 K/uL (ref 150–400)
RBC: 2.26 MIL/uL — ABNORMAL LOW (ref 4.22–5.81)
RDW: 16.1 % — ABNORMAL HIGH (ref 11.5–15.5)
WBC: 9.3 K/uL (ref 4.0–10.5)
nRBC: 0 % (ref 0.0–0.2)

## 2022-03-27 MED ORDER — CHLORHEXIDINE GLUCONATE 0.12 % MT SOLN
15.0000 mL | Freq: Two times a day (BID) | OROMUCOSAL | Status: DC
  Administered 2022-03-27 – 2022-04-06 (×18): 15 mL via OROMUCOSAL
  Filled 2022-03-27 (×17): qty 15

## 2022-03-27 MED ORDER — IBUPROFEN 200 MG PO TABS
400.0000 mg | ORAL_TABLET | Freq: Once | ORAL | Status: AC
  Administered 2022-03-27: 400 mg via ORAL
  Filled 2022-03-27: qty 2

## 2022-03-27 MED ORDER — TRAVASOL 10 % IV SOLN
INTRAVENOUS | Status: AC
  Filled 2022-03-27: qty 1248

## 2022-03-27 MED ORDER — CHLORHEXIDINE GLUCONATE CLOTH 2 % EX PADS
6.0000 | MEDICATED_PAD | Freq: Every day | CUTANEOUS | Status: DC
  Administered 2022-03-27 – 2022-04-08 (×13): 6 via TOPICAL

## 2022-03-27 MED ORDER — CHLORHEXIDINE GLUCONATE 0.12 % MT SOLN
OROMUCOSAL | Status: AC
Start: 2022-03-27 — End: 2022-03-27
  Administered 2022-03-27: 15 mL
  Filled 2022-03-27: qty 15

## 2022-03-27 MED ORDER — ORAL CARE MOUTH RINSE
15.0000 mL | Freq: Two times a day (BID) | OROMUCOSAL | Status: DC
  Administered 2022-03-27 – 2022-04-09 (×19): 15 mL via OROMUCOSAL

## 2022-03-27 NOTE — Progress Notes (Signed)
OT Cancellation Note ? ?Patient Details ?Name: Tyler [name] ?MRN: [id number] ?DOB: [birth year] ? ? ?Cancelled Treatment:    Reason Eval/Treat Not Completed: Medical issues which prohibited therapy ?Patient was noted to have had dressing changes completed with patient reporting increased pain and having just settled back in bed. OT to continue to follow and check back on 03/28/22. ?Artur Winningham OTR/L, MS ?Acute Rehabilitation Department ?Office# [phone] ?Pager# [phone] ? ?03/27/2022, 3:28 PM ?

## 2022-03-27 NOTE — Progress Notes (Signed)
PHARMACY - TOTAL PARENTERAL NUTRITION CONSULT NOTE  ? ?Indication: rectal perforation, prolonged NPO ? ?Patient Measurements: ?Height: '5\' 9"'  (175.3 cm) ?Weight: 88.6 kg (195 lb 5.2 oz) ?IBW/kg (Calculated) : 70.7 ?TPN AdjBW (KG): 82.2 ?Body mass index is 28.84 kg/m?. ? ?Assessment: 71 yo male with history of rectal bleeding and prolapsed internal hemorrhoids who recently underwent hemorrhoid surgery w/ hemorrhoidal dearterialization on 03/05/22.  He presented to [facility] on 4/25 with increasing abdominal pain, nausea, and fever.  CT abdomen/pelvis with thickening of rectal wall; gas and fluid within the rectal wall and in presacral space concerning for rectal perforation.  Pharmacy has been consulted to manage TPN as patient unable to tolerate PO. ? ?Glucose / Insulin: no history of DM, CBGs at goal < 150 ?-DC CBGs & DC SSI 5/12 ?Electrolytes:  stable. Last labs 5/12 ?Renal: stable - Scr 1.15 ?Hepatic: AST/ALT, Tbili WNL.  Albumin <1.5, TG 105, alk phos 156 ?Intake / Output: I/O -1092 , 660 ml stool. 490 ml drain OP ?GI Imaging: ?- 4/25 CTAP: rectal wall thickening w/ gas tracking into the anterior rectal wall, presacral fluid as well as stranding and gas in the mesorectal fat; consistent w/ rectal perforation ?- 4/30 CT scan: extraluminal air tracking into mesenteric space but no free peritoneal fluid   ?- 5/5 CT: Progressive Necrotizing Infection, ileus.  ?5/12 CTAP: 5.3x3.5x7.2 cm gas collection in the LUQ near the kidney.  Plan is to hold on drain and reassess next week ?GI Surgeries / Procedures:  ?- 4/21 (prior to admit): Transanal hemorrhoidal dearterialization procedure ?- 5/1 Proctoscopy, debridement right gluteal abscess with anal canal necrosis  ?- 5/6 ex lap, ostomy creation and drain placement ? ?Central access: PICC placed 4/28 ?TPN start date: 03/12/22 ? ?Nutritional Goals: ?Goal TPN rate is 100 mL/hr (provides 125 g of protein and 2395 kcals per day) ? ?RD Assessment:  (5/10) ?Estimated Needs ?Total Energy  Estimated Needs: 2400-2700 kcal ?Total Protein Estimated Needs: 125-140 grams ?Total Fluid Estimated Needs: >/= 2.5 L/day ? ? ?Current Nutrition:  ?TPN ?Soft diet (ordered 5/12)  ? ?Plan:  ?Diet advanced to soft on 5/12- continue to follow advancement for appropriate taper off TPN ?Continue TPN at 150m/hr (goal rate) ?Electrolytes in TPN:  ?Na 30 mEq/L ?K  620m/L ?Ca 54m58mL ?Mg 8 mEq/L  ?Phos 20 mmol/L  ?Cl:Ac 1:1 ?PO MVI & PO pepcid ?Monitor TPN labs on Mon/Thurs and PRN ?Hopefully will be able to wean the TPN in the next few days with patient advancing diet ? ? ?BelEudelia BunchharmD ?Clinical Pharmacist ?03/27/2022 8:52 AM ? ? ?

## 2022-03-27 NOTE — Progress Notes (Signed)
Pharmacy Antibiotic Note ? ?Tyler [name] is a 71 y.o. male admitted on 03/09/2022 with cellulitis.  Pharmacy has been consulted for vanc dosing. Ancef also started per Md. Patient was on Zosyn since 5/8 and today is day 16 total abx's. Soft tissue culture grew Ecoli and E.faecalis. Patient with hx of fevers due to [facility] and has had fevers since start of Zosyn.  ? ?03/27/2022 ?Day # 19 total antibiotics, D#4 vancomycin/ancef/flagyl, off zosyn since 5/10 5 am dose ?Still persistently febrile, Tm 101.4 ?WBC WNL ?SCr 1.15 (5/12) UOP 3500 ml/24 hr ?5/12 CT A/P:  5.3x3.5x7.2 cm gas collection in the LUQ near the kidney.  Plan is to hold on drain and reassess next week ?Plan: ?continue vanc 1g IV q24 as previously ordered and monitor closely as patient had increasing SCr while on vanc previously - goal AUC 400-550 ?Ancef 2g IV q8 ?Flagyl 500 mg IV q12 ?F/u renal function, WBC, temp, culture data ?F/u LOT ?Vancomycin levels as needed ? ?Height: '5\' 9"'$  (175.3 cm) ?Weight: 88.6 kg (195 lb 5.2 oz) ?IBW/kg (Calculated) : 70.7 ? ?Temp (24hrs), Avg:98.9 ?F (37.2 ?C), Min:98 ?F (36.7 ?C), Max:101.4 ?F (38.6 ?C) ? ?Recent Labs  ?Lab 03/20/22 ?1339 03/20/22 ?1612 03/20/22 ?2006 03/20/22 ?2342 03/21/22 ?2025 03/21/22 ?4270 03/21/22 ?1548 03/22/22 ?6237 03/23/22 ?0401 03/23/22 ?6283 03/23/22 ?1517 03/24/22 ?6160 03/25/22 ?0353 03/26/22 ?0222 03/27/22 ?0500  ?WBC  --   --   --   --  11.8*  --   --    < > 10.4  --   --  7.4 8.9 11.1* 9.3  ?CREATININE  --   --   --   --   --    < >  --    < >  --  QUESTIONABLE RESULTS, RECOMMEND RECOLLECT TO VERIFY 1.13 1.20 1.17 1.15  --   ?LATICACIDVEN 2.5* 2.9* 2.3* 2.1*  --   --   --   --   --   --   --   --   --   --   --   ?[place]  --   --   --   --   --   --  28*  --   --   --   --   --   --   --   --   ?VANCOPEAK  --   --   --   --  4*  --   --   --   --   --   --   --   --   --   --   ? < > = values in this interval not displayed.  ? ?  ?Estimated Creatinine Clearance: 66.8 mL/min (by C-G  formula based on SCr of 1.15 mg/dL).   ? ?Allergies  ?Allergen Reactions  ? Fluconazole Hives, Rash and Other (See Comments)  ?  Shingles activated ?  ? Griseofulvin Anaphylaxis, Swelling, Rash and Other (See Comments)  ?  Throat swelling ? ?  ? Penicillins Rash and Other (See Comments)  ?  High fever, tolerates Cefepime  ? Sulfa Antibiotics Other (See Comments)  ?  Joints ache, swell and caused inflammation ?  ? Mometasone Furo-Formoterol Fum Other (See Comments)  ?  Lack of therapeutic effect ?  ? Oxycodone Nausea Only and Other (See Comments)  ?  Nauseous to the point of almost vomiting  ? Retapamulin Rash  ? ?Antimicrobials this admission:  ?PTA Valtrex>> ?4/25 cefepime>>5/8 ?4/25 flagyl>>  5/8, 5/10 >> ?  5/3 vanc >>5/8, 5/10 >> ?5/8 Zosyn >> 5/10 ?5/10 Ancef >> ?Dose adjustments this admission:  ?5/7 at 0543 Vpk= 47 ?- At 1548 VT = 28 with dose 1000 mg q12h; Calc AUC 898, half life 13.5 hrs, Ke 0.0514  -> changed dose to 1000 mg q24h for AUC 449, pk 32, trough 10 ? ?Microbiology results:  ?4/25 Resp: neg ?5/1 MRSA PCR: neg ?5/3 MRSA PCR: not detected ?5/3 BCx: ngF ?5/6 soft tissue: Cxt: E.coli (R-amp/Unasyn), E.faecalis (pansensitive) ?5/11 BCx2: ngtd ? ? ?Thank you for allowing pharmacy to be a part of this patient?s care. ? ?Eudelia Bunch, Pharm.D ?03/27/2022 9:10 AM ? ?

## 2022-03-27 NOTE — Progress Notes (Signed)
PT Cancellation Note ? ?Patient Details ?Name: Tyler [name] ?MRN: [id number] ?DOB: December 30, 1950 ? ? ?Cancelled Treatment:     PT eval attempted but deferred at pt request.  "I just finally got things settled down after the dressing change".  Will follow in am. ? ? ?Stanton Kissoon ?03/27/2022, 3:31 PM ?

## 2022-03-27 NOTE — Progress Notes (Signed)
7 Days Post-Op  ? ?Subjective/Chief Complaint: ?Fevers over night  ?Pain controlled ?Very tired this morning ? ? ?Objective: ?Vital signs in last 24 hours: ?Temp:  [98 ?F (36.7 ?C)-101.4 ?F (38.6 ?C)] 98 ?F (36.7 ?C) (05/13 0800) ?Pulse Rate:  [95-124] 96 (05/13 0700) ?Resp:  [19-37] 29 (05/13 0700) ?BP: (133-167)/(73-100) 140/73 (05/13 0700) ?SpO2:  [94 %-99 %] 97 % (05/13 0700) ?Last BM Date : 03/26/22 ? ?Intake/Output from previous day: ?05/12 0701 - 05/13 0700 ?In: 3557.6 [P.O.:165; I.V.:2358; Blood:298; IV Piggyback:736.6] ?Out: 0109 [Urine:3500; Drains:490; Stool:660] ?Intake/Output this shift: ?No intake/output data recorded. ? ?Exam; ?Sleeping but wakes up, follows commands ?Abdomen soft, ostomy viable ?Drains wit brown fluid ? ?Lab Results:  ?Recent Labs  ?  03/26/22 ?0222 03/26/22 ?2100 03/27/22 ?0500  ?WBC 11.1*  --  9.3  ?HGB 7.0* 8.0* 7.7*  ?HCT 20.9* 25.1* 23.7*  ?PLT 314  --  295  ? ?BMET ?Recent Labs  ?  03/25/22 ?0353 03/26/22 ?0222  ?NA 138 136  ?K 3.7 3.8  ?CL 107 107  ?CO2 25 24  ?GLUCOSE 121* 125*  ?BUN 41* 40*  ?CREATININE 1.17 1.15  ?CALCIUM 7.2* 7.3*  ? ?PT/INR ?No results for input(s): LABPROT, INR in the last 72 hours. ?ABG ?No results for input(s): PHART, HCO3 in the last 72 hours. ? ?Invalid input(s): PCO2, PO2 ? ?Studies/Results: ?CT ABDOMEN PELVIS W CONTRAST ? ?Result Date: 03/26/2022 ?CLINICAL DATA:  Postop day 6 from diverting colostomy and abdominal washout secondary to anal canal necrosis after hemorrhoid surgery EXAM: CT ABDOMEN AND PELVIS WITH CONTRAST TECHNIQUE: Multidetector CT imaging of the abdomen and pelvis was performed using the standard protocol following bolus administration of intravenous contrast. RADIATION DOSE REDUCTION: This exam was performed according to the departmental dose-optimization program which includes automated exposure control, adjustment of the mA and/or kV according to patient size and/or use of iterative reconstruction technique. CONTRAST:  192m  OMNIPAQUE IOHEXOL 300 MG/ML  SOLN COMPARISON:  Pelvis CT 03/19/2022 FINDINGS: Lower chest: There is some dependent collapse/consolidative opacity in the lower lobes, left greater than right with small left and tiny right pleural effusions. Hepatobiliary: No suspicious focal abnormality within the liver parenchyma. There is no evidence for gallstones, gallbladder wall thickening, or pericholecystic fluid. No intrahepatic or extrahepatic biliary dilation. Pancreas: No focal mass lesion. No dilatation of the main duct. No intraparenchymal cyst. No peripancreatic edema. Spleen: No splenomegaly. No focal mass lesion. Adrenals/Urinary Tract: No adrenal nodule or mass. Kidneys unremarkable. No evidence for hydroureter. Bladder is decompressed by Foley catheter. Gas in the bladder lumen is compatible with the instrumentation. Stomach/Bowel: Stomach is unremarkable. No gastric wall thickening. No evidence of outlet obstruction. Duodenum is normally positioned as is the ligament of Treitz. No small bowel wall thickening. No small bowel dilatation. The terminal ileum is normal. The appendix is not well visualized, but there is no edema or inflammation in the region of the cecum. Right and transverse colon unremarkable. Left colon and sigmoid colon are decompressed with left abdominal sigmoid colostomy evident. The wall of the descending and sigmoid colon is irregular, likely secondary to the adjacent inflammatory changes (see below). Vascular/Lymphatic: There is mild atherosclerotic calcification of the abdominal aorta without aneurysm. Portal vein, superior mesenteric vein, and splenic vein are patent. Celiac axis and SMA are patent. There is no gastrohepatic or hepatoduodenal ligament lymphadenopathy. No retroperitoneal or mesenteric lymphadenopathy. No pelvic sidewall lymphadenopathy. Reproductive: The prostate gland and seminal vesicles are unremarkable. Other: There is extensive edema/inflammation and soft tissue  gas in  the pelvic floor presacral space. Surgical drain noted posterior to the rectum and appears to track laterally and anteriorly to the left, potentially through the reported defect of the anal wall described in the surgical note (axial image 90/2 and coronal 64/4). Gas and fluid tracks up in the presacral space and a second surgical drain entering from the anterolateral right lower abdominal wall is position within the pre sacral abscess cavity. A second more inferiorly placed right lower quadrant surgical drain is positioned in the left rectus sheath. A third drain entering from the left lower quadrant is positioned in the left paracolic gutter. Gas and edema tracks in the extraperitoneal tissues from the presacral space cranially into the retroperitoneal abdomen extending up along the left anterior pararenal fascia and posterior peritoneum. 5.3 x 3.5 x 7.2 cm collection of gas and fluid is identified in the left upper quadrant, anterior to the kidney and medial to the spleen. This tracks down to the peritoneal thickening along the left paracolic gutter but is unclear by CT whether this will be decompressed by the left paracolic gutter drain. Musculoskeletal: No worrisome lytic or sclerotic osseous abnormality. Extensive subcutaneous edema is identified in the lower abdominal and pelvic sidewall. Intermuscular fluid/edema is identified in both thighs, left greater than right with fluid tracking along the left adductor muscles and intermuscular fluid in the compartments of the anterior left thigh. Imaging was carried down to a position just caudal to the femoral neck to the level of the proximal femoral diaphyses. Several tiny gas bubbles are seen in the fluid associated with the anterior left thigh muscles (see image 93/2 and 92/2). IMPRESSION: 1. There is extensive edema/inflammation and soft tissue gas in the pelvic floor and presacral space. Surgical drain noted posterior to the rectum and appears to track  laterally and anteriorly to the left, potentially through the reported defect of the anal wall described in the surgical note. The fluid and gas tracks cranially in the retroperitoneal space of the abdomen and laterally and to the left along the anterior pararenal space and posterior peritoneum towards the paracolic gutter. 2. 5.3 x 3.5 x 7.2 cm collection of gas and fluid in the left upper quadrant, anterior to the kidney and medial to the spleen. This tracks down to the peritoneal thickening along the left paracolic gutter but is unclear by CT whether this will be decompressed by the left paracolic gutter drain. 3. Intermuscular fluid/edema is identified the region of both hips and proximal thigh bilaterally, left greater than right with fluid tracking along the left adductor muscles and intermuscular fluid in the compartments of the anterior left thigh. Several tiny gas bubbles are seen in the fluid associated with the anterior left thigh muscles. Imaging appearance raises concern for cellulitis/myositis. 4. Small left and tiny right pleural effusions with dependent collapse/consolidative opacity in the lower lobes, left greater than right. 5. Left abdominal sigmoid colostomy. Descending and sigmoid colon shows ill definition of the wall and mural edema, potentially secondary to the inflammatory process in the pelvis and left abdomen. 6. Aortic Atherosclerosis (ICD10-I70.0). Electronically Signed   By: Misty Stanley M.D.   On: 03/26/2022 10:27  ? ?DG Chest Portable 1 View ? ?Result Date: 03/27/2022 ?CLINICAL DATA:  Cough and fever. EXAM: PORTABLE CHEST 1 VIEW COMPARISON:  Mar 19, 2022 FINDINGS: There is stable right-sided PICC line positioning. The cardiac silhouette is mildly enlarged and unchanged in size. Decreased lung volumes are seen. Mild atelectasis and/or infiltrate is noted  within the left lung base. A very small left pleural effusion is suspected. No pneumothorax is identified. The visualized skeletal  structures are unremarkable. IMPRESSION: Mild left basilar atelectasis and/or infiltrate with a very small left pleural effusion. Electronically Signed   By: Virgina Norfolk M.D.   On: 03/27/2022 01:14  ? ?VAS [place]

## 2022-03-28 ENCOUNTER — Inpatient Hospital Stay (HOSPITAL_COMMUNITY): Payer: BC Managed Care – PPO

## 2022-03-28 DIAGNOSIS — R502 Drug induced fever: Secondary | ICD-10-CM | POA: Diagnosis not present

## 2022-03-28 DIAGNOSIS — L0291 Cutaneous abscess, unspecified: Secondary | ICD-10-CM | POA: Diagnosis not present

## 2022-03-28 DIAGNOSIS — A4151 Sepsis due to Escherichia coli [E. coli]: Secondary | ICD-10-CM | POA: Diagnosis not present

## 2022-03-28 DIAGNOSIS — K631 Perforation of intestine (nontraumatic): Secondary | ICD-10-CM | POA: Diagnosis not present

## 2022-03-28 LAB — COMPREHENSIVE METABOLIC PANEL
ALT: 15 U/L (ref 0–44)
AST: 22 U/L (ref 15–41)
Albumin: <1.5 g/dL — ABNORMAL LOW (ref 3.5–5.0)
Alkaline Phosphatase: 206 U/L — ABNORMAL HIGH (ref 38–126)
Anion gap: 6 (ref 5–15)
BUN: 28 mg/dL — ABNORMAL HIGH (ref 8–23)
CO2: 20 mmol/L — ABNORMAL LOW (ref 22–32)
Calcium: 7.2 mg/dL — ABNORMAL LOW (ref 8.9–10.3)
Chloride: 107 mmol/L (ref 98–111)
Creatinine, Ser: 0.98 mg/dL (ref 0.61–1.24)
GFR, Estimated: >60 mL/min (ref >60)
Glucose, Bld: 99 mg/dL (ref 70–99)
Potassium: 4.2 mmol/L (ref 3.5–5.1)
Sodium: 133 mmol/L — ABNORMAL LOW (ref 135–145)
Total Bilirubin: 0.6 mg/dL (ref 0.3–1.2)
Total Protein: 5.5 g/dL — ABNORMAL LOW (ref 6.5–8.1)

## 2022-03-28 LAB — CBC
HCT: 24.5 % — ABNORMAL LOW (ref 39.0–52.0)
Hemoglobin: 8.1 g/dL — ABNORMAL LOW (ref 13.0–17.0)
MCH: 34.9 pg — ABNORMAL HIGH (ref 26.0–34.0)
MCHC: 33.1 g/dL (ref 30.0–36.0)
MCV: 105.6 fL — ABNORMAL HIGH (ref 80.0–100.0)
Platelets: 363 10*3/uL (ref 150–400)
RBC: 2.32 MIL/uL — ABNORMAL LOW (ref 4.22–5.81)
RDW: 15.9 % — ABNORMAL HIGH (ref 11.5–15.5)
WBC: 10 10*3/uL (ref 4.0–10.5)
nRBC: 0 % (ref 0.0–0.2)

## 2022-03-28 LAB — BLOOD GAS, ARTERIAL
Acid-base deficit: 2.1 mmol/L — ABNORMAL HIGH (ref 0.0–2.0)
Allens test (pass/fail): POSITIVE — AB
Bicarbonate: 19.5 mmol/L — ABNORMAL LOW (ref 20.0–28.0)
O2 Saturation: 95.8 %
Patient temperature: 36.8
pCO2 arterial: 25 mmHg — ABNORMAL LOW (ref 32–48)
pH, Arterial: 7.5 — ABNORMAL HIGH (ref 7.35–7.45)
pO2, Arterial: 61 mmHg — ABNORMAL LOW (ref 83–108)

## 2022-03-28 LAB — LACTIC ACID, PLASMA
Lactic Acid, Venous: 6.6 mmol/L (ref 0.5–1.9)
Lactic Acid, Venous: 1.2 mmol/L (ref 0.5–1.9)

## 2022-03-28 MED ORDER — LACTATED RINGERS IV BOLUS
500.0000 mL | Freq: Once | INTRAVENOUS | Status: AC
  Administered 2022-03-28: 500 mL via INTRAVENOUS

## 2022-03-28 MED ORDER — TRAVASOL 10 % IV SOLN
INTRAVENOUS | Status: AC
  Filled 2022-03-28: qty 1248

## 2022-03-28 MED ORDER — LACTATED RINGERS IV SOLN: INTRAVENOUS | Status: DC

## 2022-03-28 MED ORDER — IOHEXOL 300 MG/ML  SOLN
100.0000 mL | Freq: Once | INTRAMUSCULAR | Status: AC | PRN
  Administered 2022-03-28: 100 mL via INTRAVENOUS

## 2022-03-28 MED ORDER — IOHEXOL 9 MG/ML PO SOLN
ORAL | Status: AC
  Administered 2022-03-28: 500 mL
  Filled 2022-03-28: qty 1000

## 2022-03-28 MED ORDER — SODIUM CHLORIDE 0.9 % IV SOLN
1.0000 g | Freq: Three times a day (TID) | INTRAVENOUS | Status: DC
  Administered 2022-03-28 – 2022-03-29 (×3): 1 g via INTRAVENOUS
  Filled 2022-03-28 (×6): qty 20

## 2022-03-28 MED ORDER — IOHEXOL 9 MG/ML PO SOLN
500.0000 mL | ORAL | Status: AC
  Administered 2022-03-28 (×2): 500 mL via ORAL

## 2022-03-28 MED ORDER — HYDROMORPHONE HCL 1 MG/ML IJ SOLN
1.0000 mg | Freq: Once | INTRAMUSCULAR | Status: AC
  Administered 2022-03-28: 1 mg via INTRAVENOUS
  Filled 2022-03-28: qty 1

## 2022-03-28 MED ORDER — IBUPROFEN 200 MG PO TABS
400.0000 mg | ORAL_TABLET | Freq: Three times a day (TID) | ORAL | Status: DC | PRN
  Administered 2022-03-28 – 2022-03-30 (×4): 400 mg via ORAL
  Filled 2022-03-28 (×5): qty 2

## 2022-03-28 NOTE — Progress Notes (Signed)
PHARMACY - TOTAL PARENTERAL NUTRITION CONSULT NOTE  ? ?Indication: rectal perforation, prolonged NPO ? ?Patient Measurements: ?Height: '5\' 9"'  (175.3 cm) ?Weight: 88.6 kg (195 lb 5.2 oz) ?IBW/kg (Calculated) : 70.7 ?TPN AdjBW (KG): 82.2 ?Body mass index is 28.84 kg/m?. ? ?Assessment: 71 yo male with history of rectal bleeding and prolapsed internal hemorrhoids who recently underwent hemorrhoid surgery w/ hemorrhoidal dearterialization on 03/05/22.  He presented to [facility] on 4/25 with increasing abdominal pain, nausea, and fever.  CT abdomen/pelvis with thickening of rectal wall; gas and fluid within the rectal wall and in presacral space concerning for rectal perforation.  Pharmacy has been consulted to manage TPN as patient unable to tolerate PO. ? ?Glucose / Insulin: no history of DM, CBGs at goal < 150 ?-DC CBGs & DC SSI 5/12 ?Electrolytes:  stable. Last labs 5/12 ?Renal: stable - Scr 1.15 (5/12)  ?Hepatic: AST/ALT, Tbili WNL.  Albumin <1.5, TG 105, alk phos 156 (5/11)  ?Intake / Output: I/O -842 , 335 ml stool. 298 ml drain OP, UOP 3410/24 h ?GI Imaging: ?- 4/25 CTAP: rectal wall thickening w/ gas tracking into the anterior rectal wall, presacral fluid as well as stranding and gas in the mesorectal fat; consistent w/ rectal perforation ?- 4/30 CT scan: extraluminal air tracking into mesenteric space but no free peritoneal fluid   ?- 5/5 CT: Progressive Necrotizing Infection, ileus.  ?5/12 CTAP: 5.3x3.5x7.2 cm gas collection in the LUQ near the kidney.  Plan is to hold on drain and reassess next week ?GI Surgeries / Procedures:  ?- 4/21 (prior to admit): Transanal hemorrhoidal dearterialization procedure ?- 5/1 Proctoscopy, debridement right gluteal abscess with anal canal necrosis  ?- 5/6 ex lap, ostomy creation and drain placement ? ?Central access: PICC placed 4/28 ?TPN start date: 03/12/22 ? ?Nutritional Goals: ?Goal TPN rate is 100 mL/hr (provides 125 g of protein and 2395 kcals per day) ? ?RD Assessment:   (5/10) ?Estimated Needs ?Total Energy Estimated Needs: 2400-2700 kcal ?Total Protein Estimated Needs: 125-140 grams ?Total Fluid Estimated Needs: >/= 2.5 L/day ? ? ?Current Nutrition:  ?TPN ?Soft diet (ordered 5/12) - no po intake recorded ?Refused all Ensure 5/13 ? ?Plan:  ?Diet advanced to soft on 5/12- continue to follow advancement for appropriate taper off TPN ?Continue TPN at 110m/hr (goal rate) ?Electrolytes in TPN:  ?Na 30 mEq/L ?K  673m/L ?Ca 36m18mL ?Mg 8 mEq/L  ?Phos 20 mmol/L  ?Cl:Ac 1:1 ?PO MVI & PO pepcid ?Monitor TPN labs on Mon/Thurs and PRN ?Hopefully will be able to wean the TPN in the next few days with patient advancing diet (currently not eating) ? ?MicEudelia Bunchharm.D ?03/28/2022 10:00 AM ? ? ?

## 2022-03-28 NOTE — Progress Notes (Signed)
Patient noted to have increased agitation at night with increased confusion. Patient states he is at [facility]. Patient calling out, moaning, and making frequent requests for repositioning and water. Attempted PRNs without success. Patient encouraged to use PCA, but seems patient is forgetting that he has the button. Will continue to reposition as needed.  ?

## 2022-03-28 NOTE — Progress Notes (Signed)
Patient ID: [name], male   DOB: 07-23-1951, 71 y.o.   MRN: [id number] ? ?I have discussed the patient's current condition with his partner.  We reviewed his CT scan from Friday.  Despite and improvement in his WBC and stable Hgb, he is having increased somnolence.  Dr. Dema Severin reviewed the CT from Friday with IR and they discussed repeat imaging this week prior to a drain. ?After discussing the situation with the patient's partner, will consult Infectious Disease, CCM, and have IR see today for their opinion. ?ABG is pending. Patient will be made ICU status. ?Continuing current antibiotics for now ?Continuing TNA ? ?

## 2022-03-28 NOTE — Progress Notes (Signed)
Patient with a history of recent transanal hemorrhoidal dearterialization 03/05/22. Post-op course complicated by anal canal necrosis with abscess formations. He underwent I&D 03/15/22 followed by abdominal wash out with drain placements and creation of diverting colostomy 03/20/22.  ? ?CT Abdomen/pelvis 03/26/22 showed multiple areas of concern including: 5.3 x 3.5 x 7.2 cm collection of gas and fluid is identified in the left upper quadrant, anterior to the kidney and medial to the ?spleen. This tracks down to the peritoneal thickening along the left ?paracolic gutter but is unclear by CT whether this will be ?decompressed by the left paracolic gutter drain. ? ?Dr. Dema Severin spoke with Dr. Serafina Royals 03/26/22 to discuss possible intervention. The fluid component is small and the location would be challenging/risky to access. Decision made to continue medical management with possible repeat imaging at a later date if patient situation warrants. ? ?IR re-consulted today. The patient's WBC count is decreasing, he is afebrile but he's having worsening abdominal pain and changes in mental status. Imaging reviewed by Dr. Laurence Ferrari who agrees with Dr. Malachi Carl prior assessment - the fluid collection is small and in a risky location.  ? ?This information was discussed with Dr. Ninfa Linden as well as the patient's partner, [name]. If repeat imaging shows an enlarging fluid collection IR can review the imaging for possible procedure.  ? ?No IR procedure planned and the current order will be deleted.  ? Soyla Dryer, AGACNP-BC ?[phone] ?03/28/2022, 11:29 AM ? ? ? ? ?

## 2022-03-28 NOTE — Progress Notes (Signed)
PT Cancellation Note ? ?Patient Details ?Name: Tyler [name] ?MRN: [id number] ?DOB: 21-Feb-1951 ? ? ?Cancelled Treatment:    Reason Eval/Treat Not Completed: Medical issues which prohibited therapy; Pt is in Red MEWS at this time, HR 120 at rest/tachy, incr somnolence per MD note. CCS consulting CCM, IR and ID. ? ?This is the 4th consecutive cancel for this pt, 3 medical cancels and one refusal d/t pain.  We are signing off at this time, please re-consult PT when/if  pt becomes medically stable. Thank you. ? ? ? ? ?Baxter Flattery, PT ? ?Acute Rehab Dept [facility]) [phone] ?Pager [phone] ? ?03/28/2022 ? ?[facility] ?03/28/2022, 9:27 AM ?

## 2022-03-28 NOTE — Progress Notes (Signed)
PCCM Progress Note ? ?Reviewed CT A/P with General Surgery. Slightly decreased but persistent gas and fluid collection anterior to left kidney with LLQ drain in place. Unchanged inflammation. Interval progression of intramuscular fluid collection in left thigh. Bilateral pleural effusion with atelectasis L>R. ? ?Surgical team will discuss with primary surgeon tomorrow for definite plan. Will need to discuss with IR tomorrow regarding drainage of thigh abscess. ? ?Updated spouse. Addressed questions and concerns. ? ? ?

## 2022-03-28 NOTE — Progress Notes (Signed)
Patient ID: Tyler [name], male   DOB: [birth year], 71 y.o.   MRN: [id number] ? ? ?I have reviewed the CT scan of the abdomen and pelvis and have reexamined the patient.  The fluid collection into the anterior left kidney is slightly decreased in size.  It appears there is a minimal window visible for interventional radiology to aspirate this although it have to defer to them.  Inflammatory changes in the pelvis are unchanged.  There are no abnormalities of the bowel.  There is nothing concerning on CT scan of the abdomen or on his physical examination I would push me to going to the operating room for exploration. ? ?There is progression of the fluid collections in the left medial posterior thigh muscle.  Again, this could be concerning for an intramuscular abscess.  On physical examination I cannot elicit any pain or tenderness in his anterior posterior left thigh and there is no crepitus or erythema. ?I will notify Dr. Marcello Moores of this.  We will have to consider a possible aspiration of the fluid collections by interventional radiology to help determine whether these are infectious or not. ? ?He currently remains hemodynamically stable. ? ?I discussed the CT findings with the patient and his partner. ? ?Of note, his repeat lactic acid level was 1.2 ?

## 2022-03-28 NOTE — Progress Notes (Addendum)
? ?NAME:  Tyler [name], MRN:  [id number], DOB:  February 10, 1951, LOS: 34 ?ADMISSION DATE:  03/09/2022, CONSULTATION DATE:  03/28/22 ?REFERRING MD:  Marcello Moores, CHIEF COMPLAINT:  sepsis  ? ?History of Present Illness:  ?Tyler [name] is a 71 y.o. M with PMH of Asthma, GERD and HTN who underwent a routine transanal hemorrhoidal dearterialization on 4/21.   He experienced worsening abdominal pain, nausea and fever, so presented to the ED on 4/25.   CT scan of the abdomen and pelvis was performed.  This showed thickening of the rectal wall.  He was admitted by surgery and treated conservatively with Cefepime and Flagyl, however continued to have pain and fever, and repeat CT with possible perforation, so was taken to the OR for I&D of the anal canal on 5/1 there was necrosis of the L posterior anal canal which was debrided.   He initially felt better, but over the last 48 hours has continued to be febrile with WBC increasing to 33k.   He reports continued lower abdominal pain and nausea.  BP has been stable, though HR increased to 120-130.  He has been passing gas and blood per rectum and making urine.  Pt was transferred to stepdown and PCCM consulted ? ?Pertinent  Medical History  ? 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 ? ? ?Significant Hospital Events: ?Including procedures, antibiotic start and stop dates in addition to other pertinent events   ?4/21 hemorrhoidal dearterialization ?4/25 admitted to surgery with fever and  pain, thickening of rectal wall  ?5/1 Taken to OR for anal canal I&D and rectal wall necrosis debridement ?5/3 signs of worsening sepsis, transfer to progressive and PCCM consult ?CT abd/pelvis 4/25 Rectal wall thickening with gas tracking into the anterior rectal ?wall, presacral fluid as well as stranding and gas in the mesorectal fat, findings most consistent with rectal perforation. CT abd/pelvis 4/30 1. Significant increase in extraluminal extraperitoneal gas in the abdomen and pelvis from CT 5 days ago as described above. Source of air likely represents rectal perforation. There is generalized increase in stranding and ill-defined fluid associated with this extraluminal air. No peripherally enhancing fluid collection. 2. Left inguinal hernia contains tracking air and small amount of non organized free fluid. ?5/4 HR better end organ fxn stable ?5/6 Has worsening infection on CT scan and patient taken back to OR for diverting colostomy with ostomy creation and abd wash out.  PCCM reconsulted for hypotension.  ?5/6-5/9 PCCM followed for possible evolving sepsis. Has had intermittent fevers however leukocytosis has resolved and clinically improving ?5/14 PCCM reconsulted for tachycardia and possible sepsis ? ? ?Interim History / Subjective:  ? ?Improved leukocytosis however has had intermittent fevers since 5/8 ?Vanc was restarted by primary team on 5/10. On Cefazolin and flagyl ?Blood cx 5/11 NGTD ? ?This morning with more persistent tachycardia to 120s. SBP 150s. Tachypneic however on RA with SpO2 95% ? ?Objective   ?Blood pressure (!) 155/80, pulse (!) 120, temperature 99.3 ?F (37.4 ?C), temperature source Oral, resp. rate (!) 46, height '5\' 9"'$  (1.753 m), weight 88.6 kg, SpO2 95 %. ?CVP:  [4 mmHg-8 mmHg] 5 mmHg  ?  FiO2 (%):  [0 %] 0 %  ? ?Intake/Output Summary (Last 24 hours) at 03/28/2022 0928 ?Last data filed at 03/28/2022 0630 ?Gross per 24 hour  ?Intake 2931.78 ml  ?Output 4043 ml  ?Net -1111.22 ml  ? ?Filed  Weights  ? 03/18/22 0800 03/24/22 1208 03/26/22 0400  ?Weight: 82.2 kg 73 kg 88.6 kg  ? ? ?Physical Exam: ?General: Chronically ill-appearing, appears uncomfortable/anguished with any movement including turning. Awake, alert and follows commands. Oriented but distracted by pain ?HENT: Tyler [name], AT, OP clear, MMM ?Eyes: EOMI, no scleral icterus ?Respiratory: Clear to auscultation bilaterally.  No crackles, wheezing or rales ?Cardiovascular: Tachycardic, RR, -M/R/G, no JVD ?GI: Hypoactive BS, soft, tender, drains x 3 ?Extremities: 2+ pitting edema,+tenderness ?Neuro: AAO x2, CNII-XII grossly intact, moves extremities x 4 however limited due to pain ?GU: Foley in place, improved scrotal edema ? ?Bedside Thorax [place] 5/14 with no left pleural effusion, consolidated lung ? ?K 4.2 ?Cr improved ? ?WBC normalized since 5/8 ? ?Bcx 5/3 NGTD ?Would culture 5/6 Few Ecoli, Few enterococcus ? ?Imaging, labs and test noted above have been reviewed independently by me. ? ?Resolved Hospital Problem list   ?Sepsis secondary to abscess - resolved ?AKI secondary to ATN in setting of sepsis ?Acute hypoxemic respiratory failure secondary atelectasis, pain ?NAGMA in context of hyperchloremia ?Assessment & Plan:  ? ?Principal Problem: ?  Rectal perforation ([place]) ?Active Problems: ?  Abdominal pain ?  Acute urinary retention ?  Sepsis ([place]) ?  Ileus following gastrointestinal surgery (HCC) ?  Normal anion gap metabolic acidosis ?  Anemia ?  Post-op pain ? ?Sepsis secondary to anal abscess +Ecoli, Enterococcus ?C/B necrotic anal wall  ?Persistent fevers - since 5/8 however leukocytosis resolved. 5/10 [place] of bilateral UE/LE neg. CT A/P 5/12 with fluid and gas collection in pelvic floor and presarcral space and edema/inflammation in LUQ  ?S/p I&D and debridement on 5/1  ?Diverting ostomy, washout on 5/6 ?Primary surgery team re-consulted PCCM 5/14 ?ID and IR has also been consulted by primary ?Pain management per Surgery ?Continue antibiotics ?Performed  bedside ultrasound of left thorax which demonstrated consolidated lung but no pleural effusion to drain ? ?Respiratory alkalosis secondary to tachypnea ?ABG reviewed. Adequate oxygenation ?Recommend pain control ? ?Ileus exacerbated by narcotics ?Bowel regimen and bowel rest per Surgery ? ?Anemia ?S/p 2 units PRBCs in OR  ?Plan ?Trend ? ?Volume overload - improving however remains +10L since admission ?Scrotal/extremity edema - improving ? ?ICU delirium ?Anxiety ?Pain control with PCA above ?IV ativan q8 PRN ? ?Pulmonary will continue to follow. Updated spouse. Discussed plan with ID and bedside RN. ? ?Best Practice (right click and "Reselect all SmartList Selections" daily)  ? ?Diet/type: TPN ?DVT prophylaxis: SCD  ?GI prophylaxis: N/A ?Lines: yes and it is still needed ?Foley:  Yes, and it is still needed ?Code Status:  full code ?Last date of multidisciplinary goals of care discussion [per primary ] ? ?Critical care time:   ? ?The patient is critically ill with multiple organ systems failure and requires high complexity decision making for assessment and support, frequent evaluation and titration of therapies, application of advanced monitoring technologies and extensive interpretation of multiple databases.  Independent Critical Care Time: 14 Minutes.  ? ?Rodman Pickle, M.D. ?[facility] ?03/28/2022 9:28 AM  ? ?Please see Amion for pager number to reach on-call Pulmonary and Critical Care Team. ? ? ? ?

## 2022-03-28 NOTE — Progress Notes (Signed)
8 Days Post-Op  ? ?Subjective/Chief Complaint: ?No acute changes overnight ?Fever curve decreasing ?Denies SOB ?Remains intermittently tachycardic ? ? ? ?Objective: ?Vital signs in last 24 hours: ?Temp:  [97.7 ?F (36.5 ?C)-100.9 ?F (38.3 ?C)] 99.4 ?F (37.4 ?C) (05/14 0400) ?Pulse Rate:  [77-125] 120 (05/14 0600) ?Resp:  [22-42] 32 (05/14 0600) ?BP: (133-162)/(75-93) 159/93 (05/14 0600) ?SpO2:  [93 %-98 %] 96 % (05/14 0600) ?FiO2 (%):  [0 %] 0 % (05/13 1610) ?Last BM Date : 03/20/22 ? ?Intake/Output from previous day: ?05/13 0701 - 05/14 0700 ?In: 3131.6 [I.V.:2379.3; IV Piggyback:752.3] ?Out: 9323 [Urine:3410; Drains:298; Stool:335] ?Intake/Output this shift: ?No intake/output data recorded. ? ?Exam: ?Wakes easily, follows commands ?Abdomen soft, ostomy viable ?Wounds stable, drains continue to have brown fluid ? ?Lab Results:  ?Recent Labs  ?  03/27/22 ?0500 03/28/22 ?0457  ?WBC 9.3 10.0  ?HGB 7.7* 8.1*  ?HCT 23.7* 24.5*  ?PLT 295 363  ? ?BMET ?Recent Labs  ?  03/26/22 ?0222  ?NA 136  ?K 3.8  ?CL 107  ?CO2 24  ?GLUCOSE 125*  ?BUN 40*  ?CREATININE 1.15  ?CALCIUM 7.3*  ? ?PT/INR ?No results for input(s): LABPROT, INR in the last 72 hours. ?ABG ?No results for input(s): PHART, HCO3 in the last 72 hours. ? ?Invalid input(s): PCO2, PO2 ? ?Studies/Results: ?CT ABDOMEN PELVIS W CONTRAST ? ?Result Date: 03/26/2022 ?CLINICAL DATA:  Postop day 6 from diverting colostomy and abdominal washout secondary to anal canal necrosis after hemorrhoid surgery EXAM: CT ABDOMEN AND PELVIS WITH CONTRAST TECHNIQUE: Multidetector CT imaging of the abdomen and pelvis was performed using the standard protocol following bolus administration of intravenous contrast. RADIATION DOSE REDUCTION: This exam was performed according to the departmental dose-optimization program which includes automated exposure control, adjustment of the mA and/or kV according to patient size and/or use of iterative reconstruction technique. CONTRAST:  159m OMNIPAQUE  IOHEXOL 300 MG/ML  SOLN COMPARISON:  Pelvis CT 03/19/2022 FINDINGS: Lower chest: There is some dependent collapse/consolidative opacity in the lower lobes, left greater than right with small left and tiny right pleural effusions. Hepatobiliary: No suspicious focal abnormality within the liver parenchyma. There is no evidence for gallstones, gallbladder wall thickening, or pericholecystic fluid. No intrahepatic or extrahepatic biliary dilation. Pancreas: No focal mass lesion. No dilatation of the main duct. No intraparenchymal cyst. No peripancreatic edema. Spleen: No splenomegaly. No focal mass lesion. Adrenals/Urinary Tract: No adrenal nodule or mass. Kidneys unremarkable. No evidence for hydroureter. Bladder is decompressed by Foley catheter. Gas in the bladder lumen is compatible with the instrumentation. Stomach/Bowel: Stomach is unremarkable. No gastric wall thickening. No evidence of outlet obstruction. Duodenum is normally positioned as is the ligament of Treitz. No small bowel wall thickening. No small bowel dilatation. The terminal ileum is normal. The appendix is not well visualized, but there is no edema or inflammation in the region of the cecum. Right and transverse colon unremarkable. Left colon and sigmoid colon are decompressed with left abdominal sigmoid colostomy evident. The wall of the descending and sigmoid colon is irregular, likely secondary to the adjacent inflammatory changes (see below). Vascular/Lymphatic: There is mild atherosclerotic calcification of the abdominal aorta without aneurysm. Portal vein, superior mesenteric vein, and splenic vein are patent. Celiac axis and SMA are patent. There is no gastrohepatic or hepatoduodenal ligament lymphadenopathy. No retroperitoneal or mesenteric lymphadenopathy. No pelvic sidewall lymphadenopathy. Reproductive: The prostate gland and seminal vesicles are unremarkable. Other: There is extensive edema/inflammation and soft tissue gas in the pelvic  floor presacral space.  Surgical drain noted posterior to the rectum and appears to track laterally and anteriorly to the left, potentially through the reported defect of the anal wall described in the surgical note (axial image 90/2 and coronal 64/4). Gas and fluid tracks up in the presacral space and a second surgical drain entering from the anterolateral right lower abdominal wall is position within the pre sacral abscess cavity. A second more inferiorly placed right lower quadrant surgical drain is positioned in the left rectus sheath. A third drain entering from the left lower quadrant is positioned in the left paracolic gutter. Gas and edema tracks in the extraperitoneal tissues from the presacral space cranially into the retroperitoneal abdomen extending up along the left anterior pararenal fascia and posterior peritoneum. 5.3 x 3.5 x 7.2 cm collection of gas and fluid is identified in the left upper quadrant, anterior to the kidney and medial to the spleen. This tracks down to the peritoneal thickening along the left paracolic gutter but is unclear by CT whether this will be decompressed by the left paracolic gutter drain. Musculoskeletal: No worrisome lytic or sclerotic osseous abnormality. Extensive subcutaneous edema is identified in the lower abdominal and pelvic sidewall. Intermuscular fluid/edema is identified in both thighs, left greater than right with fluid tracking along the left adductor muscles and intermuscular fluid in the compartments of the anterior left thigh. Imaging was carried down to a position just caudal to the femoral neck to the level of the proximal femoral diaphyses. Several tiny gas bubbles are seen in the fluid associated with the anterior left thigh muscles (see image 93/2 and 92/2). IMPRESSION: 1. There is extensive edema/inflammation and soft tissue gas in the pelvic floor and presacral space. Surgical drain noted posterior to the rectum and appears to track laterally and  anteriorly to the left, potentially through the reported defect of the anal wall described in the surgical note. The fluid and gas tracks cranially in the retroperitoneal space of the abdomen and laterally and to the left along the anterior pararenal space and posterior peritoneum towards the paracolic gutter. 2. 5.3 x 3.5 x 7.2 cm collection of gas and fluid in the left upper quadrant, anterior to the kidney and medial to the spleen. This tracks down to the peritoneal thickening along the left paracolic gutter but is unclear by CT whether this will be decompressed by the left paracolic gutter drain. 3. Intermuscular fluid/edema is identified the region of both hips and proximal thigh bilaterally, left greater than right with fluid tracking along the left adductor muscles and intermuscular fluid in the compartments of the anterior left thigh. Several tiny gas bubbles are seen in the fluid associated with the anterior left thigh muscles. Imaging appearance raises concern for cellulitis/myositis. 4. Small left and tiny right pleural effusions with dependent collapse/consolidative opacity in the lower lobes, left greater than right. 5. Left abdominal sigmoid colostomy. Descending and sigmoid colon shows ill definition of the wall and mural edema, potentially secondary to the inflammatory process in the pelvis and left abdomen. 6. Aortic Atherosclerosis (ICD10-I70.0). Electronically Signed   By: Misty Stanley M.D.   On: 03/26/2022 10:27  ? ?DG Chest Portable 1 View ? ?Result Date: 03/27/2022 ?CLINICAL DATA:  Cough and fever. EXAM: PORTABLE CHEST 1 VIEW COMPARISON:  Mar 19, 2022 FINDINGS: There is stable right-sided PICC line positioning. The cardiac silhouette is mildly enlarged and unchanged in size. Decreased lung volumes are seen. Mild atelectasis and/or infiltrate is noted within the left lung base. A very small  left pleural effusion is suspected. No pneumothorax is identified. The visualized skeletal structures are  unremarkable. IMPRESSION: Mild left basilar atelectasis and/or infiltrate with a very small left pleural effusion. Electronically Signed   By: Virgina Norfolk M.D.   On: 03/27/2022 01:14   ? ?Anti-infectiv

## 2022-03-28 NOTE — Progress Notes (Signed)
Pharmacy Antibiotic Note ? ?Tyler [name] is a 71 y.o. male admitted on 03/09/2022 with cellulitis.  Pharmacy has been consulted for vanc dosing. Ancef also started per Md. Patient was on Zosyn since 5/8 and today is day 16 total abx's. Soft tissue culture grew Ecoli and E.faecalis. Patient with hx of fevers due to [facility] and has had fevers since start of Zosyn.  ? ?03/28/2022 ?Day # 20 total antibiotics, D#5 vancomycin/ancef/flagyl, off zosyn since 5/10 5 am dose ?To broaden ancef/flagyl to meropenem and continue vancomycin due to lactate up to 6.6 & ongoing tachycardia & tachypnea ?Still persistently febrile, but Tmax decreasing ?WBC WNL ?SCr 1.15 (5/12) UOP 3410 ml/24 hr ?5/12 CT A/P:  5.3x3.5x7.2 cm gas collection in the LUQ near the kidney. ? ?Plan: ?continue vanc 1g IV q24 as previously ordered and monitor closely as patient had increasing SCr while on vanc previously - goal AUC 400-550 ?Meropenem 1 gm IV q8h ?F/u renal function, WBC, temp, culture data ?Vancomycin levels as needed ? ?Height: '5\' 9"'$  (175.3 cm) ?Weight: 88.6 kg (195 lb 5.2 oz) ?IBW/kg (Calculated) : 70.7 ? ?Temp (24hrs), Avg:99.6 ?F (37.6 ?C), Min:98.5 ?F (36.9 ?C), Max:100.9 ?F (38.3 ?C) ? ?Recent Labs  ?Lab 03/21/22 ?1548 03/22/22 ?0415 03/23/22 ?8938 03/24/22 ?0356 03/25/22 ?0353 03/26/22 ?0222 03/27/22 ?0500 03/28/22 ?0457 03/28/22 ?1017 03/28/22 ?1215  ?WBC  --    < >  --  7.4 8.9 11.1* 9.3 10.0  --   --   ?CREATININE  --    < > 1.13 1.20 1.17 1.15  --   --  0.98  --   ?LATICACIDVEN  --   --   --   --   --   --   --   --   --  6.6*  ?VANCOTROUGH 28*  --   --   --   --   --   --   --   --   --   ? < > = values in this interval not displayed.  ? ?  ?Estimated Creatinine Clearance: 78.4 mL/min (by C-G formula based on SCr of 0.98 mg/dL).   ? ?Allergies  ?Allergen Reactions  ? Fluconazole Hives, Rash and Other (See Comments)  ?  Shingles activated ?  ? Griseofulvin Anaphylaxis, Swelling, Rash and Other (See Comments)  ?  Throat swelling ? ?  ?  Penicillins Rash and Other (See Comments)  ?  High fever, tolerates Cefepime  ? Sulfa Antibiotics Other (See Comments)  ?  Joints ache, swell and caused inflammation ?  ? Mometasone Furo-Formoterol Fum Other (See Comments)  ?  Lack of therapeutic effect ?  ? Oxycodone Nausea Only and Other (See Comments)  ?  Nauseous to the point of almost vomiting  ? Retapamulin Rash  ? ?Antimicrobials this admission:  ?PTA Valtrex>> ?4/25 cefepime>>5/8 ?4/25 flagyl>>  5/8, 5/10 >>5/14 ?5/3 vanc >>5/8, 5/10 >> ?5/8 Zosyn >> 5/10 ?5/10 Ancef >>5/14 ?5/14 meropenem>> ?Dose adjustments this admission:  ?5/7 at 0543 Vpk= 47 ?- At 1548 VT = 28 with dose 1000 mg q12h; Calc AUC 898, half life 13.5 hrs, Ke 0.0514  -> changed dose to 1000 mg q24h for AUC 449, pk 32, trough 10 ? ?Microbiology results:  ?4/25 Resp: neg ?5/1 MRSA PCR: neg ?5/3 MRSA PCR: not detected ?5/3 BCx: ngF ?5/6 soft tissue: Cxt: E.coli (R-amp/Unasyn), E.faecalis (pansensitive) ?5/11 BCx2: ngtd ? ?Thank you for allowing pharmacy to be a part of this patient?s care. ? ?Eudelia Bunch, Pharm.D ?  03/28/2022 1:36 PM ? ?

## 2022-03-28 NOTE — Progress Notes (Signed)
OT Cancellation Note ? ?Patient Details ?Name: Tyler [name] ?MRN: [id number] ?DOB: September 18, 1951 ? ? ?Cancelled Treatment:    Reason Eval/Treat Not Completed: Patient not medically ready ?Patient is currently in [facility] at this time. OT to continue to follow and check back as schedule will allow.  ?Chinonso Linker OTR/L, MS ?Acute Rehabilitation Department ?Office# [phone] ?Pager# [phone] ? ?03/28/2022, 6:48 AM ?

## 2022-03-28 NOTE — Consult Note (Addendum)
?  [facility]  ? ? ?Date of Admission:  03/09/2022    ? ?Reason for Consult: Intra-abdominal infection ?    ?Referring Physician: Dr Ninfa Linden ? ?Current antibiotics: ?Cefazolin ?Metronidazole ?Vancomycin ? ? ?ASSESSMENT:   ? ?71 y.o. male admitted with: ? ?Intra-abdominal infection: Patient developed sepsis following initial transanal hemorrhoidal dearterialization 03/05/2022.  Presented this admission 03/09/2022 with abdominal pain and fever.  Status post OR 5/1 with findings of necrosis of the anoderm posteriorly on EUA and large right gluteal abscess which was opened and drained.  Repeat CT 5/5 given ongoing sepsis physiology showed significant retroperitoneal contamination.  Taken back to the OR 5/6 with findings of significant infection status post creation of diverting colostomy and drain placement x3 as well as a Penrose drain in the perineal space.  Cultures from that OR notable for E. coli, E faecalis, anaerobes.  Currently on adequate antimicrobial coverage for these organisms.  Most recent CT 5/12 showed edema/inflammation and soft tissue gas in the pelvic floor and presacral space with surgical drains in place.  Also noted was a 5.3 x 3.5 x 7.2 cm fluid collection in the left upper quadrant.  This was evaluated by IR again today who feels that its in a risky anatomical location for drain placement.  They are recommending repeat imaging later this week for reassessment. ?Sepsis: Secondary to #1 ?Reported drug fever with Zosyn ? ?RECOMMENDATIONS:   ? ?Patient with complicated intra-abdominal infection following initial surgical procedure 4/21.  He has been on adequate antimicrobial coverage for the isolated organisms thus far.  Most recent imaging shows a fluid collection in the left upper quadrant, presumably abscess.  Interventional radiology has evaluated and deemed this is in a risky anatomical position for drain placement. ?His fever curve appears to be improving over the  last 24 to 48 hours and leukocytosis has normalized.  Blood cultures this admission have been negative. ?Ideally, would be able to tolerate Zosyn as this would provide good intra-abdominal coverage for all isolated organisms thus far.  However, we will have to further clarify his potential allergy with patient and his partner.  His partner had stepped away at the time of my visit with [name] but I will be at [facility] all next week to continue seeing him. ?For now we will continue vancomycin which provides enterococcal coverage ?We will continue metronidazole for anaerobic coverage and cefazolin which provides coverage for E. Coli ?Anticipate repeat imaging in the near future to reassess LUQ fluid collection ?Will follow closely ? ? ?ADDENDUM 1:24 PM ?- discussed with CCM.   Lactate up to 6.6 with ongoing tachycardia, tachypnea ?- repeat imaging now ?- fluids ?- discussed with patients partner.  Unable to further clarify his antibiotic allergy to Zosyn at this time ?- will broaden to Meropenem  ?- continue vancomycin ? ? ?Principal Problem: ?  Rectal perforation ([place]) ?Active Problems: ?  Abdominal pain ?  Acute urinary retention ?  Sepsis ([place]) ?  Ileus following gastrointestinal surgery ([facility]) ?  Normal anion gap metabolic acidosis ?  Anemia ?  Post-op pain ? ? ?MEDICATIONS:   ? ?Scheduled Meds: ? acetaminophen  1,000 mg Oral Q6H  ? chlorhexidine  15 mL Mouth Rinse BID  ? Chlorhexidine Gluconate Cloth  6 each Topical Q0600  ? famotidine  20 mg Oral BID  ? feeding supplement  237 mL Oral BID BM  ? ferrous sulfate  325 mg Oral TID  ? HYDROmorphone   Intravenous Q4H  ?  HYDROmorphone (DILAUDID) injection  1 mg Intravenous Once  ? lip balm  1 application. Topical BID  ? mouth rinse  15 mL Mouth Rinse q12n4p  ? multivitamin with minerals  1 tablet Oral Daily  ? sodium chloride flush  10-40 mL Intracatheter Q12H  ? ?Continuous Infusions: ? sodium chloride 10 mL/hr at 03/28/22 1000  ?  ceFAZolin (ANCEF) IV Stopped  (03/28/22 0526)  ? chlorproMAZINE (THORAZINE) IV    ? methocarbamol (ROBAXIN) IV Stopped (03/27/22 2235)  ? metronidazole 500 mg (03/28/22 1204)  ? ondansetron (ZOFRAN) IV    ? TPN ADULT (ION) 100 mL/hr at 03/28/22 1000  ? TPN ADULT (ION)    ? vancomycin 1,000 mg (03/28/22 1041)  ? ?PRN Meds:.sodium chloride, albuterol, azelastine, chlorproMAZINE (THORAZINE) IV, diphenhydrAMINE, enalaprilat, LORazepam, magic mouthwash, menthol-cetylpyridinium, methocarbamol (ROBAXIN) IV, metoprolol tartrate, naloxone **AND** sodium chloride flush, ondansetron (ZOFRAN) IV **OR** ondansetron (ZOFRAN) IV, ondansetron **OR** [DISCONTINUED] ondansetron (ZOFRAN) IV, simethicone, sodium chloride flush ? ?HPI:   ? ?Tyler [name] is a 71 y.o. male with a past medical history as noted below who has been admitted at [facility] since April 25.  Patient initially underwent transanal hemorrhoidal dearterialization procedure on 4/21 with general surgery.  He presented to [facility] emergency department 4 days later on 4/25 with increasing abdominal pain, nausea, and fever.  A CT scan was obtained which was abnormal with thickening of the rectal wall, gas and fluid within the rectal wall and in the presacral space concerning for perforation.  He was observed as it was felt that the CT findings likely represented postoperative pelvic inflammation and he had no evidence of leukocytosis.  However, he was treated with cefepime and metronidazole due to the possibility of infection.  He subsequently developed leukocytosis and fever.  A repeat CT scan was obtained on 4/30.  This showed rectal wall thickening with gas tracking into the anterior rectal wall, presacral fluid as well as stranding and gas in the mesorectal fat concerning for rectal perforation.  Patient was taken back to the operating room on 5/1 where he was found to have anal canal necrosis and right gluteal abscess status post debridement and incision and drainage.  Cultures  were not obtained at that time.  Patient was noted to have fever and tachycardia on 5/3 necessitating transfer to the stepdown unit.  Blood cultures were obtained which were no growth.  He had another CT scan obtained on 5/5 with appearance compatible with progressive necrotizing infection affecting multiple deep spaces of the lower abdomen and pelvis and now tracking into the left lower extremity along the sciatic neurovascular bundle.  Other findings included organizing left retroperitoneal and left pelvic sidewall abscess.  Abundant presacral and space of Retzius gas and suspicion for ongoing dehiscence of the left lateral rectal wall.  He returned to the OR on 5/6 for diagnostic laparoscopy, abdominal washout, and drain placement with creation of diverting colostomy.  During that OR he was found to have extraperitoneal infection tracking into the presacral space, left pericolic gutter, and infrapubic space.  He had drains x 3 placed as well as a penrose drain in the perianal space.  Cultures were obtained in the OR notable for E. coli, Enterococcus faecalis, and mixed anaerobic flora.  Patient's antibiotics were adjusted based on OR cultures to piperacillin tazobactam.  However, this was adjusted to Ancef, metronidazole, vancomycin as patient's partner reported high fevers previously on Zosyn.  At that time he was also having fevers but these have  persisted with the discontinuation of piperacillin/tazobactam.  His Tmax in the last 24 hours is 100.9.  He had a repeat set of blood cultures obtained on 5/11 that are no growth to date.  Given ongoing fevers, he had a repeat CT scan on 5/12.  This showed edema/inflammation and soft tissue gas in the pelvic floor and presacral space with a surgical drain posterior to the rectum tracking laterally and anteriorly to the left.  A 5.3 x 3.5 x 7.2 cm gas collection in the left upper quadrant near the kidney.  This was discussed with IR and they discussed repeat imaging  later in the week prior to placing a drain.  However, general surgery has opted to have IR see patient today for their opinion after discussing the situation with the patient's partner. ? ?Patient was reeva

## 2022-03-29 ENCOUNTER — Inpatient Hospital Stay (HOSPITAL_COMMUNITY): Payer: BC Managed Care – PPO

## 2022-03-29 ENCOUNTER — Encounter (HOSPITAL_COMMUNITY): Payer: Self-pay | Admitting: Radiology

## 2022-03-29 DIAGNOSIS — L0291 Cutaneous abscess, unspecified: Secondary | ICD-10-CM | POA: Diagnosis not present

## 2022-03-29 DIAGNOSIS — K631 Perforation of intestine (nontraumatic): Secondary | ICD-10-CM | POA: Diagnosis not present

## 2022-03-29 DIAGNOSIS — E43 Unspecified severe protein-calorie malnutrition: Secondary | ICD-10-CM | POA: Diagnosis not present

## 2022-03-29 DIAGNOSIS — A4151 Sepsis due to Escherichia coli [E. coli]: Secondary | ICD-10-CM | POA: Diagnosis not present

## 2022-03-29 LAB — CBC
HCT: 23.8 % — ABNORMAL LOW (ref 39.0–52.0)
Hemoglobin: 7.8 g/dL — ABNORMAL LOW (ref 13.0–17.0)
MCH: 35.8 pg — ABNORMAL HIGH (ref 26.0–34.0)
MCHC: 32.8 g/dL (ref 30.0–36.0)
MCV: 109.2 fL — ABNORMAL HIGH (ref 80.0–100.0)
Platelets: 347 10*3/uL (ref 150–400)
RBC: 2.18 MIL/uL — ABNORMAL LOW (ref 4.22–5.81)
RDW: 15.5 % (ref 11.5–15.5)
WBC: 7.4 10*3/uL (ref 4.0–10.5)
nRBC: 0 % (ref 0.0–0.2)

## 2022-03-29 LAB — COMPREHENSIVE METABOLIC PANEL
ALT: 16 U/L (ref 0–44)
AST: 24 U/L (ref 15–41)
Albumin: <1.5 g/dL — ABNORMAL LOW (ref 3.5–5.0)
Alkaline Phosphatase: 215 U/L — ABNORMAL HIGH (ref 38–126)
Anion gap: 6 (ref 5–15)
BUN: 24 mg/dL — ABNORMAL HIGH (ref 8–23)
CO2: 20 mmol/L — ABNORMAL LOW (ref 22–32)
Calcium: 7.4 mg/dL — ABNORMAL LOW (ref 8.9–10.3)
Chloride: 105 mmol/L (ref 98–111)
Creatinine, Ser: 0.86 mg/dL (ref 0.61–1.24)
GFR, Estimated: >60 mL/min (ref >60)
Glucose, Bld: 93 mg/dL (ref 70–99)
Potassium: 4.1 mmol/L (ref 3.5–5.1)
Sodium: 131 mmol/L — ABNORMAL LOW (ref 135–145)
Total Bilirubin: 0.5 mg/dL (ref 0.3–1.2)
Total Protein: 5.6 g/dL — ABNORMAL LOW (ref 6.5–8.1)

## 2022-03-29 LAB — TRIGLYCERIDES
Triglycerides: 940 mg/dL — ABNORMAL HIGH (ref <150)
Triglycerides: 111 mg/dL (ref <150)

## 2022-03-29 LAB — MAGNESIUM: Magnesium: 1.9 mg/dL (ref 1.7–2.4)

## 2022-03-29 LAB — GLUCOSE, CAPILLARY: Glucose-Capillary: 123 mg/dL — ABNORMAL HIGH (ref 70–99)

## 2022-03-29 LAB — PHOSPHORUS: Phosphorus: 3.4 mg/dL (ref 2.5–4.6)

## 2022-03-29 MED ORDER — PROSOURCE PLUS PO LIQD: 30.0000 mL | Freq: Two times a day (BID) | ORAL | Status: DC

## 2022-03-29 MED ORDER — PIPERACILLIN-TAZOBACTAM 3.375 G IVPB
3.3750 g | Freq: Three times a day (TID) | INTRAVENOUS | Status: DC
  Administered 2022-03-29 – 2022-04-09 (×33): 3.375 g via INTRAVENOUS
  Filled 2022-03-29 (×35): qty 50

## 2022-03-29 MED ORDER — DIPHENHYDRAMINE HCL 50 MG/ML IJ SOLN
12.5000 mg | Freq: Four times a day (QID) | INTRAMUSCULAR | Status: DC | PRN
  Administered 2022-03-29: 12.5 mg via INTRAVENOUS
  Administered 2022-03-31 – 2022-04-01 (×5): 25 mg via INTRAVENOUS
  Filled 2022-03-29 (×7): qty 1

## 2022-03-29 MED ORDER — LORAZEPAM 2 MG/ML IJ SOLN
0.5000 mg | INTRAMUSCULAR | Status: DC | PRN
  Administered 2022-03-29 – 2022-04-02 (×6): 0.5 mg via INTRAVENOUS
  Filled 2022-03-29 (×7): qty 1

## 2022-03-29 MED ORDER — LORAZEPAM 2 MG/ML IJ SOLN
0.2500 mg | Freq: Three times a day (TID) | INTRAMUSCULAR | Status: DC
  Administered 2022-03-30 – 2022-04-01 (×4): 0.25 mg via INTRAVENOUS
  Filled 2022-03-29 (×4): qty 1

## 2022-03-29 MED ORDER — TRAVASOL 10 % IV SOLN
INTRAVENOUS | Status: AC
  Filled 2022-03-29: qty 624

## 2022-03-29 MED ORDER — METOPROLOL TARTRATE 5 MG/5ML IV SOLN
5.0000 mg | Freq: Four times a day (QID) | INTRAVENOUS | Status: DC
  Administered 2022-03-29 – 2022-04-01 (×12): 5 mg via INTRAVENOUS
  Filled 2022-03-29 (×12): qty 5

## 2022-03-29 MED ORDER — ENSURE ENLIVE PO LIQD
237.0000 mL | Freq: Three times a day (TID) | ORAL | Status: DC
  Administered 2022-03-29 – 2022-04-09 (×19): 237 mL via ORAL

## 2022-03-29 MED ORDER — PROSOURCE PLUS PO LIQD
30.0000 mL | Freq: Three times a day (TID) | ORAL | Status: DC
  Administered 2022-03-29 – 2022-04-09 (×36): 30 mL via ORAL
  Filled 2022-03-29 (×36): qty 30

## 2022-03-29 MED ORDER — GADOBUTROL 1 MMOL/ML IV SOLN: 9.0000 mL | Freq: Once | INTRAVENOUS | Status: DC | PRN

## 2022-03-29 NOTE — Progress Notes (Signed)
PHARMACY - TOTAL PARENTERAL NUTRITION CONSULT NOTE  ? ?Indication: rectal perforation, prolonged NPO ? ?Patient Measurements: ?Height: 5' 9" (175.3 cm) ?Weight: 88.6 kg (195 lb 5.2 oz) ?IBW/kg (Calculated) : 70.7 ?TPN AdjBW (KG): 82.2 ?Body mass index is 28.84 kg/m?. ? ?Assessment: 71 yo male with history of rectal bleeding and prolapsed internal hemorrhoids who recently underwent hemorrhoid surgery w/ hemorrhoidal dearterialization on 03/05/22.  He presented to [facility] on 4/25 with increasing abdominal pain, nausea, and fever.  CT abdomen/pelvis with thickening of rectal wall; gas and fluid within the rectal wall and in presacral space concerning for rectal perforation.  Pharmacy has been consulted to manage TPN as patient unable to tolerate PO. ? ?Glucose / Insulin: no history of DM, CBG/SSI d/c 5/12.  Glucose on chem < 150 ?Electrolytes:  Na 131, CO2 20, others WNL ?Renal: stable - Scr down to 0.86, BUN 24 ?Hepatic: AST/ALT, Tbili WNL.  Albumin <1.5, TG 111, alk phos rising to 215 ?Intake / Output: I/O net -1.6 L.   ?- Output:  300 ml stool, 285 ml drain, 5381 ml urine ?- Intake:  No PO diet, or feeding supplement intake recorded. ?GI Imaging: ?- 4/25 CTAP: rectal wall thickening w/ gas tracking into the anterior rectal wall, presacral fluid as well as stranding and gas in the mesorectal fat; consistent w/ rectal perforation ?- 4/30 CT scan: extraluminal air tracking into mesenteric space but no free peritoneal fluid   ?- 5/5 CT: Progressive Necrotizing Infection, ileus.  ?5/12 CTAP: 5.3x3.5x7.2 cm gas collection in the LUQ near the kidney.   ?5/14 CTAP: Persistent but slightly decreased gas/fluid collection anterior to the left kidney.  Progression of IM fluid collections within left thigh.  ?GI Surgeries / Procedures:  ?- 4/21 (prior to admit): Transanal hemorrhoidal dearterialization procedure ?- 5/1 Proctoscopy, debridement right gluteal abscess with anal canal necrosis  ?- 5/6 ex lap, ostomy creation and drain  placement ? ?Central access: PICC placed 4/28 ?TPN start date: 03/12/22 ? ?Nutritional Goals: ?Goal TPN rate is 100 mL/hr (provides 125 g of protein and 2395 kcals per day) ? ?RD Assessment:   ?Estimated Needs ?Total Energy Estimated Needs: 2400-2700 kcal ?Total Protein Estimated Needs: 125-140 grams ?Total Fluid Estimated Needs: >/= 2.5 L/day ? ? ?Current Nutrition:  ?TPN ?Ensure and Soft diet (5/12) - no po intake recorded ?- 5/15 Pt attempted to eat pancake and sausage this morning but per RN only had ~2 bites.  ? ?Plan:  ?Discussed PO diet with Dr Thomas on 5/15; due to concern for potential PICC line infection, would like to wean off TPN and increase PO nutrition ASAP. ?Add protein supplements.  ?Decrease to 1/2 rate TPN at 50ml/hr ?Electrolytes in TPN:  ?Na 60 mEq/L ?K  60mEq/L ?Ca 5mEq/L ?Mg 8 mEq/L  ?Phos 20 mmol/L  ?Cl:Ac 1:2 ?Continue PO MVI & PO pepcid  (taking PO meds) ?Monitor TPN labs on Mon/Thurs and PRN ?Follow up further TPN weaning.   ? ?  PharmD, BCPS ?Clinical Pharmacist ?[facility] main pharmacy [phone] ?03/29/2022 7:27 AM ? ? ?

## 2022-03-29 NOTE — Progress Notes (Addendum)
? ?NAME:  Tyler [name], MRN:  [id number], DOB:  Jan 04, 1951, LOS: 102 ?ADMISSION DATE:  03/09/2022, CONSULTATION DATE:  03/29/22 ?REFERRING MD:  Dr. Marcello Moores, Surgery CHIEF COMPLAINT:  sepsis  ? ?History of Present Illness:  ?71 yo male former smoker had transanal hemorrhoidal dearterialization on 4/21.  Presented to [facility] ER on 4/25 with nausea, fever, and abdominal pain.  CT abd/pelvis showed rectal wall thickening.  Admit by surgery and started on antibiotics.  Had persistent pain and fever.  Repeat CT abdomen/pelvis showed possible perforation.  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  He had initial improvement, but then had recurrent symptoms.  He was taken to the OR on 5/06 and had diagnostic laparotomy, abdominal wash out, drain placement and diverting colostomy.  PCCM consulted to assist with medical management in ICU. ? ?Pertinent  Medical History  ?Allergies, Shingles, ED, GERD, Pityriasis rosea, Thrombocytopenia, Colon polyp, HTN, Asthma, Osteoarthritis ? ?Significant Hospital Events:   ?4/21 hemorrhoidal dearterialization ?4/25 admitted to surgery with fever and pain, thickening of rectal wall  ?5/01 Taken to OR for anal canal I&D and rectal wall necrosis debridement ?5/03 signs of worsening sepsis, transfer to progressive and PCCM consult ?5/04 HR better end organ fxn stable ?5/06 Has worsening infection on CT scan and patient taken back to OR for diverting colostomy with ostomy creation and abd wash out.  PCCM reconsulted for hypotension.  ?5/6-5/9 PCCM followed for possible evolving sepsis. Has had intermittent fevers however leukocytosis has resolved and clinically improving ?5/14 PCCM reconsulted for tachycardia and possible sepsis; IR and ID consulted ? ?Interim History / Subjective:  ?Tm 101.3.  Still feels weak, but feels some improvement compared to last 3 or 4 days.  Denies chest pain or dyspnea. ? ?Objective   ?Blood pressure  136/74, pulse (!) 120, temperature 99.6 ?F (37.6 ?C), temperature source Oral, resp. rate (!) 28, height '5\' 9"'$  (1.753 m), weight 88.6 kg, SpO2 94 %. ?CVP:  [3 mmHg-6 mmHg] 6 mmHg  ?FiO2 (%):  [0 %] 0 %  ? ?Intake/Output Summary (Last 24 hours) at 03/29/2022 0928 ?Last data filed at 03/29/2022 0900 ?Gross per 24 hour  ?Intake 4329.82 ml  ?Output 5192 ml  ?Net -862.18 ml  ? ?Filed Weights  ? 03/18/22 0800 03/24/22 1208 03/26/22 0400  ?Weight: 82.2 kg 73 kg 88.6 kg  ? ? ?Physical Exam: ? ?General - alert ?Eyes - pupils reactive ?ENT - no sinus tenderness, no stridor ?Cardiac - regular, tachycardic ?Chest - equal breath sounds b/l, no wheezing or rales ?Abdomen - soft, mild distention, decreased bowel sounds, LLQ colostomy, drain in place ?Extremities - no cyanosis, clubbing, or edema ?Skin - no rashes ?Neuro - normal strength, moves extremities, follows commands ?Psych - normal mood and behavior ? ?Assessment & Plan:  ? ?Sepsis 2nd to anal abscess with E coli and Enterococcus faecalis in wound culture. ?Fluid collection in Lt medial posterior thigh muscle. ?- Day 21 of Abx, currently on meropenem/flagyl/vancomycin per ID ?- post op care, nutrition, pain control per surgery ? ?Anemia from GI blood loss and critical illness. ?- f/u CBC ?- transfuse for Hb < 7  ?- continue ferrous sulfate ? ?Sinus tachycardia. ?Hx of hypertension. ?- scheduled lopressor ? ?Hx of allergic asthma. ?- prn albuterol ? ?Anxiety. ?- prn ativan ? ?Best Practice (right click and "Reselect all SmartList Selections" daily)  ? ?Diet/type: Regular consistency (see orders) and TPN ?  DVT prophylaxis: SCD  ?GI prophylaxis: PPI ?Lines: yes and it is still needed, double lumen Rt PICC 03/12/22 ?Foley:  Yes, and it is still needed ?Code Status:  full code ?Last date of multidisciplinary goals of care discussion [per primary ] ? ?Labs:  ? ? ?  Latest Ref Rng & Units 03/29/2022  ?  6:10 AM 03/28/2022  ?  8:50 AM 03/26/2022  ?  2:22 AM  ?CMP  ?Glucose 70 - 99 mg/dL  93   99   125    ?BUN 8 - 23 mg/dL 24   28   40    ?Creatinine 0.61 - 1.24 mg/dL 0.86   0.98   1.15    ?Sodium 135 - 145 mmol/L 131   133   136    ?Potassium 3.5 - 5.1 mmol/L 4.1   4.2   3.8    ?Chloride 98 - 111 mmol/L 105   107   107    ?CO2 22 - 32 mmol/L '20   20   24    '$ ?Calcium 8.9 - 10.3 mg/dL 7.4   7.2   7.3    ?Total Protein 6.5 - 8.1 g/dL 5.6   5.5     ?Total Bilirubin 0.3 - 1.2 mg/dL 0.5   0.6     ?Alkaline Phos 38 - 126 U/L 215   206     ?AST 15 - 41 U/L 24   22     ?ALT 0 - 44 U/L 16   15     ? ? ? ?  Latest Ref Rng & Units 03/29/2022  ?  4:30 AM 03/28/2022  ?  4:57 AM 03/27/2022  ?  5:00 AM  ?CBC  ?WBC 4.0 - 10.5 K/uL 7.4   10.0   9.3    ?Hemoglobin 13.0 - 17.0 g/dL 7.8   8.1   7.7    ?Hematocrit 39.0 - 52.0 % 23.8   24.5   23.7    ?Platelets 150 - 400 K/uL 347   363   295    ? ? ?ABG ?   ?Component Value Date/Time  ? PHART 7.5 (H) 03/28/2022 0843  ? PCO2ART 25 (L) 03/28/2022 0843  ? PO2ART 61 (L) 03/28/2022 0843  ? HCO3 19.5 (L) 03/28/2022 0843  ? TCO2 26 03/05/2022 1110  ? ACIDBASEDEF 2.1 (H) 03/28/2022 0843  ? O2SAT 95.8 03/28/2022 0843  ? ? ?CBG (last 3)  ?Recent Labs  ?  03/29/22 ?0727  ?GLUCAP 123*  ? ? ?Signature:  ?Chesley Mires, MD ?[place] ?Pager - [phone] ?03/29/2022, 9:52 AM ? ? ?

## 2022-03-29 NOTE — Progress Notes (Signed)
?   ? ?[facility] ? ?Date of Admission:  03/09/2022    ?       ?Reason for visit: Follow up on intra-abdominal infection ? ?Current antibiotics: ?Meropenem ?Vancomycin ? ?ASSESSMENT:   ? ?71 y.o. male admitted with: ? ?Intra-abdominal infection: Patient admitted with sepsis following initial transanal hemorrhoidal dearterialization 03/05/2022.  Presented this admission on 4/25 with abdominal pain and fever and taken to the OR 5/1 with findings of necrosis of the anoderm posteriorly on EUA and large right gluteal abscess which was opened and drained.  Repeat CT 5/5 given ongoing sepsis physiology showed significant retroperitoneal contamination.  Taken back to the OR 5/6 with findings of significant infection status post creation of diverting colostomy and drain placement x3 as well as a Penrose drain in the peritoneal space.  Cultures from the OR at this time were notable for E. coli, E faecalis, anaerobes.  Follow-up CT scan 5/12 showed edema/inflammation and soft tissue gas in the pelvic floor and presacral space with surgical drains in place.  He did have a 5.3 x 3.5 x 7.2 cm fluid collection of the left upper quadrant as well as intramuscular fluid/edema in the thighs left greater than right concerning for possible cellulitis/myositis.  IR was consulted for possible drainage of the left upper quadrant fluid collection, however, this was deemed too risky of an anatomical location to do so.  Another CT scan was obtained 5/14 due to worsening sepsis physiology that was notable for overall stability in the pelvis.  The left upper quadrant fluid collection had decreased in size since the prior study and was felt to be contiguous with the left lower quadrant surgical drain.  This imaging did however note interval progression of intramuscular fluid collections within the medial and posterior musculature of the proximal left thigh concerning for intramuscular abscess with largest collection  5.1 x 3.1 cm. ?Sepsis: Secondary to #1.  Remains febrile. ?Penicillin allergy: Discussed at length with patient and partner today.  Reports history of high fevers with intramuscular penicillin injections as a child. ?Protein calorie malnutrition: Albumin is < 1.5. ? ?RECOMMENDATIONS:   ? ?After discussion with patient and partner, would favor challenge with Zosyn again ?Recommend IR to see re: left thigh abscess for aspiration and/or drainage ?Nutrition ?Wound care ?Lab monitoring ?Discussed with surgery.  Appreciate their help and willingness to challenge with zosyn again ? ? ?Principal Problem: ?  Rectal perforation ([place]) ?Active Problems: ?  Abdominal pain ?  Acute urinary retention ?  Sepsis ([place]) ?  Ileus following gastrointestinal surgery ([facility]) ?  Normal anion gap metabolic acidosis ?  Anemia ?  Post-op pain ? ? ? ?MEDICATIONS:   ? ?Scheduled Meds: ? (feeding supplement) PROSource Plus  30 mL Oral TID PC & HS  ? acetaminophen  1,000 mg Oral Q6H  ? chlorhexidine  15 mL Mouth Rinse BID  ? Chlorhexidine Gluconate Cloth  6 each Topical Q0600  ? famotidine  20 mg Oral BID  ? feeding supplement  237 mL Oral TID BM  ? ferrous sulfate  325 mg Oral TID  ? HYDROmorphone   Intravenous Q4H  ? lip balm  1 application. Topical BID  ? LORazepam  0.25 mg Intravenous Q8H  ? mouth rinse  15 mL Mouth Rinse q12n4p  ? metoprolol tartrate  5 mg Intravenous Q6H  ? multivitamin with minerals  1 tablet Oral Daily  ? sodium chloride flush  10-40 mL Intracatheter Q12H  ? ?Continuous Infusions: ?  sodium chloride 10 mL/hr at 03/29/22 1000  ? chlorproMAZINE (THORAZINE) IV    ? meropenem (MERREM) IV Stopped (03/29/22 0709)  ? methocarbamol (ROBAXIN) IV Stopped (03/27/22 2235)  ? ondansetron (ZOFRAN) IV    ? TPN ADULT (ION) 100 mL/hr at 03/29/22 1000  ? TPN ADULT (ION)    ? vancomycin 1,000 mg (03/29/22 1015)  ? ?PRN Meds:.sodium chloride, albuterol, azelastine, chlorproMAZINE (THORAZINE) IV, diphenhydrAMINE, enalaprilat, ibuprofen,  LORazepam, magic mouthwash, menthol-cetylpyridinium, methocarbamol (ROBAXIN) IV, naloxone **AND** sodium chloride flush, ondansetron (ZOFRAN) IV **OR** ondansetron (ZOFRAN) IV, ondansetron **OR** [DISCONTINUED] ondansetron (ZOFRAN) IV, simethicone, sodium chloride flush ? ?SUBJECTIVE:  ? ?24 hour events:  ?Repeat CT scan obtained after lactic acid of 6.6 ?Follow-up lactic acid had normalized ?CT scan showed stable findings in the abdomen/pelvis with concerns for intramuscular abscesses in the left thigh ?Remains on Vanco and meropenem ?Febrile, Tmax 101.3 ?WBC normal ? ?Patient seen this morning.  Seems to be feeling better.  His partner is at the bedside. ? ?Review of Systems  ?All other systems reviewed and are negative. ? ?  ?OBJECTIVE:  ? ?Blood pressure 136/88, pulse (!) 108, temperature 98.9 ?F (37.2 ?C), temperature source Oral, resp. rate (!) 25, height '5\' 9"'$  (1.753 m), weight 88.6 kg, SpO2 94 %. ?Body mass index is 28.84 kg/m?. ? ?Physical Exam ?Constitutional:   ?   Comments: Tired and appears ill, however, better today than yesterday  ?HENT:  ?   Head: Normocephalic and atraumatic.  ?Eyes:  ?   Extraocular Movements: Extraocular movements intact.  ?   Conjunctiva/sclera: Conjunctivae normal.  ?Pulmonary:  ?   Effort: Pulmonary effort is normal. No respiratory distress.  ?Abdominal:  ?   Palpations: Abdomen is soft.  ?   Comments: Status post diverting colostomy ?Surgical drains in place  ?Musculoskeletal:  ?   Cervical back: Normal range of motion and neck supple.  ?   Right lower leg: Edema present.  ?   Left lower leg: Edema present.  ?   Comments: PICC line in place  ?Neurological:  ?   General: No focal deficit present.  ?   Mental Status: He is oriented to person, place, and time.  ?Psychiatric:     ?   Mood and Affect: Mood normal.     ?   Behavior: Behavior normal.  ? ? ? ?Lab Results: ?Lab Results  ?Component Value Date  ? WBC 7.4 03/29/2022  ? HGB 7.8 (L) 03/29/2022  ? HCT 23.8 (L) 03/29/2022  ?  MCV 109.2 (H) 03/29/2022  ? PLT 347 03/29/2022  ?  ?Lab Results  ?Component Value Date  ? NA 131 (L) 03/29/2022  ? K 4.1 03/29/2022  ? CO2 20 (L) 03/29/2022  ? GLUCOSE 93 03/29/2022  ? BUN 24 (H) 03/29/2022  ? CREATININE 0.86 03/29/2022  ? CALCIUM 7.4 (L) 03/29/2022  ? GFRNONAA >60 03/29/2022  ? GFRAA  03/23/2022  ?  QUESTIONABLE RESULTS, RECOMMEND RECOLLECT TO VERIFY  ?  ?Lab Results  ?Component Value Date  ? ALT 16 03/29/2022  ? AST 24 03/29/2022  ? ALKPHOS 215 (H) 03/29/2022  ? BILITOT 0.5 03/29/2022  ? ? ?No results found for: CRP ? ?No results found for: ESRSEDRATE ?  ?I have reviewed the micro and lab results in Epic. ? ?Imaging: ?CT ABDOMEN PELVIS W CONTRAST ? ?Result Date: 03/28/2022 ?CLINICAL DATA:  Abdominal pain, recent diverting colostomy. EXAM: CT ABDOMEN AND PELVIS WITH CONTRAST TECHNIQUE: Multidetector CT imaging of the abdomen and pelvis was performed  using the standard protocol following bolus administration of intravenous contrast. RADIATION DOSE REDUCTION: This exam was performed according to the departmental dose-optimization program which includes automated exposure control, adjustment of the mA and/or kV according to patient size and/or use of iterative reconstruction technique. CONTRAST:  115m OMNIPAQUE IOHEXOL 300 MG/ML  SOLN COMPARISON:  03/26/2022 FINDINGS: Lower chest: Small bilateral pleural effusions and dependent lower lobe atelectasis, left greater than right. Stable a cardiomegaly. Hepatobiliary: No focal liver abnormality is seen. No gallstones, gallbladder wall thickening, or biliary dilatation. Pancreas: Unremarkable. No pancreatic ductal dilatation or surrounding inflammatory changes. Spleen: Normal in size without focal abnormality. Adrenals/Urinary Tract: Kidneys enhance normally and symmetrically. The adrenals are unremarkable. Bladder is decompressed with a Foley catheter. 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 No evidence of bowel obstruction or ileus. Vascular/Lymphatic: Stable aortic atherosclerosis. Multiple subcentimeter lymph nodes in the mesentery are

## 2022-03-29 NOTE — Progress Notes (Addendum)
Nutrition Follow-up ? ?INTERVENTION:  ? ?-Ensure Plus High Protein po TID, each supplement provides 350 kcal and 20 grams of protein.  ? ?-Prosource Plus PO QID, each provides 100 kcals and 15g protein ? ?-TPN management per Pharmacy ? ?NUTRITION DIAGNOSIS:  ? ?Increased nutrient needs related to acute illness, post-op healing as evidenced by estimated needs. ? ?Ongoing. ? ?GOAL:  ? ?Patient will meet greater than or equal to 90% of their needs ? ?Progressing. ? ?MONITOR:  ? ?PO intake, Supplement acceptance, Diet advancement, Labs, Weight trends, Other (Comment) (TPN regimen) ? ?REASON FOR ASSESSMENT:  ? ?Consult ?Poor PO ? ?ASSESSMENT:  ? ?71 y.o. male with a past medical history of hypertension, GERD, status post appendectomy, status post inguinal hernia repair, status post transanal hemorrhoid dearterialization (03/05/2022 performed by Dr. Marcello Moores).  Presents to the emergency department with a complaint of abdominal pain, nausea, vomiting, and fever. ? ?Significant Events: ?4/25- admission ?4/26- diet changed from NPO to NPO with sips of clears allowed ?4/28- initial RD assessment; double lumen PICC placed in R brachial; TPN initiation ?5/1- anal exam with debridement, I&D of anal canal, proctoscopy d/t pelvic infection--found to have necrosis of anoderm and large R gluteal abscess which was opened and drained ?5/6- diagnostic ex lap, abdominal washout and drain placement, diverting colostomy, anal exam ? ?Consult received to maximize protein supplements. Increased Ensure HP to TID and Prosource Plus to QID (in total provides 1450 kcals and 120g protein). ? ?Pt consumed only bites at breakfast this morning. ? ?Currently receiving TPN at goal rate of 100 ml/hr. ?TPN decreasing today to 50 ml/hr (providing 1197 kcals and 62g protein). ? ?Admission weight: 170 lbs. ?Last weight 5/12: 195 lbs ? ?Per nursing documentation, pt with moderate generalized edema, moderate BUE edema and severe BLE edema. ? ?Medications:  Ferrous sulfate, Multivitamin with minerals daily ? ?Labs reviewed: ? CBGs: 123 ?Low Na ?TG 111 ? ?Diet Order:   ?Diet Order   ? ?       ?  DIET SOFT Room service appropriate? Yes; Fluid consistency: Thin  Diet effective now       ?  ? ?  ?  ? ?  ? ? ?EDUCATION NEEDS:  ? ?Education needs have been addressed ? ?Skin:  Skin Assessment: Skin Integrity Issues: ?Skin Integrity Issues:: Incisions ?Incisions: rectum (5/1) and abdomen (5/6) ? ?Last BM:  5/14 -type 7 ? ?Height:  ? ?Ht Readings from Last 1 Encounters:  ?03/18/22 '5\' 9"'$  (1.753 m)  ? ? ?Weight:  ? ?Wt Readings from Last 1 Encounters:  ?03/26/22 88.6 kg  ? ? ?BMI:  Body mass index is 28.84 kg/m?. ? ?Estimated Nutritional Needs:  ? ?Kcal:  2400-2700 kcal ? ?Protein:  125-140 grams ? ?Fluid:  >/= 2.5 L/day ? ?Clayton Bibles, MS, RD, LDN ?Inpatient Clinical Dietitian ?Contact information available via Amion ? ?

## 2022-03-29 NOTE — Progress Notes (Signed)
Spoke with Dr. Marcello Moores over the phone and MD gave order that it's okay for patient to travel to MRI without RN.  ?

## 2022-03-29 NOTE — Progress Notes (Signed)
Patient was having chills, temperature checked and 99.8, then chills resolved. Temperature rechecked and fever 102.6 therefore ibuprofen given since scheduled tylenol had been given previously. Temperature to be rechecked.  ?

## 2022-03-29 NOTE — Progress Notes (Addendum)
9 Days Post-Op ex lap, ostomy creation and drain placement ?Subjective: ?Seems stable today, good UOP, still with fevers, wbc wnl, mainly having anterior pelvic pain when he moves ?Objective: ?Vital signs in last 24 hours: ?Temp:  [98.5 ?F (36.9 ?C)-101.3 ?F (38.5 ?C)] 99.6 ?F (37.6 ?C) (05/15 0755) ?Pulse Rate:  [99-123] 120 (05/15 0800) ?Resp:  [22-35] 28 (05/15 0805) ?BP: (132-161)/(73-96) 136/74 (05/15 0800) ?SpO2:  [92 %-97 %] 94 % (05/15 0805) ?FiO2 (%):  [0 %] 0 % (05/14 1617) ? ? ?Intake/Output from previous day: ?05/14 0701 - 05/15 0700 ?In: 4473.2 [I.V.:2896.6; IV Piggyback:1576.7] ?Out: 6222 [LNLGX:2119; Drains:285; ERDEY:814] ?Intake/Output this shift: ?No intake/output data recorded. ? ? ?General appearance: alert and cooperative ?GI: soft, distended, appropriately TTP ?Pelvis JP with purulent fluid ?Sub Q and RP drains with cloudy, purulent fluid ?L thigh: no TTP ?Wet to dry dressing in place ?Colostomy viable with stool and air ? ?Lab Results:  ?Recent Labs  ?  03/28/22 ?4818 03/29/22 ?0430  ?WBC 10.0 7.4  ?HGB 8.1* 7.8*  ?HCT 24.5* 23.8*  ?PLT 363 347  ? ? ?BMET ?Recent Labs  ?  03/28/22 ?5631 03/29/22 ?0610  ?NA 133* 131*  ?K 4.2 4.1  ?CL 107 105  ?CO2 20* 20*  ?GLUCOSE 99 93  ?BUN 28* 24*  ?CREATININE 0.98 0.86  ?CALCIUM 7.2* 7.4*  ? ? ?PT/INR ?No results for input(s): LABPROT, INR in the last 72 hours. ? ?ABG ?Recent Labs  ?  03/28/22 ?0843  ?PHART 7.5*  ?HCO3 19.5*  ? ? ?MEDS, Scheduled ? acetaminophen  1,000 mg Oral Q6H  ? chlorhexidine  15 mL Mouth Rinse BID  ? Chlorhexidine Gluconate Cloth  6 each Topical Q0600  ? famotidine  20 mg Oral BID  ? feeding supplement  237 mL Oral BID BM  ? ferrous sulfate  325 mg Oral TID  ? HYDROmorphone   Intravenous Q4H  ? lip balm  1 application. Topical BID  ? LORazepam  0.25 mg Intravenous Q8H  ? mouth rinse  15 mL Mouth Rinse q12n4p  ? metoprolol tartrate  5 mg Intravenous Q6H  ? multivitamin with minerals  1 tablet Oral Daily  ? sodium chloride flush  10-40  mL Intracatheter Q12H  ? ? ?Studies/Results: ?CT ABDOMEN PELVIS W CONTRAST ? ?Result Date: 03/28/2022 ?CLINICAL DATA:  Abdominal pain, recent diverting colostomy. EXAM: CT ABDOMEN AND PELVIS WITH CONTRAST TECHNIQUE: Multidetector CT imaging of the abdomen and pelvis was performed using the standard protocol following bolus administration of intravenous contrast. RADIATION DOSE REDUCTION: This exam was performed according to the departmental dose-optimization program which includes automated exposure control, adjustment of the mA and/or kV according to patient size and/or use of iterative reconstruction technique. CONTRAST:  153m OMNIPAQUE IOHEXOL 300 MG/ML  SOLN COMPARISON:  03/26/2022 FINDINGS: Lower chest: Small bilateral pleural effusions and dependent lower lobe atelectasis, left greater than right. Stable a cardiomegaly. Hepatobiliary: No focal liver abnormality is seen. No gallstones, gallbladder wall thickening, or biliary dilatation. Pancreas: Unremarkable. No pancreatic ductal dilatation or surrounding inflammatory changes. Spleen: Normal in size without focal abnormality. Adrenals/Urinary Tract: Kidneys enhance normally and symmetrically. The adrenals are unremarkable. Bladder is decompressed with a Foley catheter. Stomach/Bowel: Left lower quadrant diverting colostomy is again identified, with mild distal colonic wall thickening just proximal to the colostomy site again noted. No evidence of bowel obstruction or ileus. Vascular/Lymphatic: Stable aortic atherosclerosis. Multiple subcentimeter lymph nodes in the mesentery are likely reactive. No pathologic adenopathy. Reproductive: Prostate is unremarkable. Other: Persistent  inflammatory changes in the perirectal and presacral tissues, with soft tissue gas seen in the presacral space and extending along the bilateral pelvic sidewalls unchanged. There is a surgical drain in the presacral space via a right gluteal approach unchanged. There are 3 additional  surgical drains. Most inferior drain is within the lower anterior abdominal wall within the rectus musculature, with no surrounding fluid collection. Two other drains entering the peritoneal space view of the right lower quadrant and left lower quadrant are coiled within the lower pelvis. The drains are unchanged in appearance. Inflammatory changes are again seen within the left lower quadrant mesentery. A stable gas and fluid collection anterior to the left kidney is again identified, measuring approximately 4.8 x 4.0 x 7.2 cm, slightly decreased in size since prior study. This is likely contiguous with the area drained by the left lower quadrant surgical drain. Musculoskeletal: No acute or destructive bony lesions. Intramuscular gas/fluid collections are seen within the medial and posterior proximal left thigh, concerning for intramuscular abscesses. Largest collection within the hamstring musculature proximal left thigh image 106/2 measures 5.1 x 3.1 cm. Reconstructed images demonstrate no additional findings. IMPRESSION: 1. Persistent gas and fluid collection anterior to the left kidney, likely contiguous with the area drain by the left lower quadrant surgical drain. Slight interval decrease in size since prior study. 2. Extensive inflammatory changes within the presacral space and along the pelvic sidewall, with numerous surgical drains as above, without significant change since prior study. 3. Interval progression of intramuscular fluid collections within the medial and posterior musculature of the proximal left thigh, concerning for intramuscular abscesses. Largest collection within the left hamstring musculature as above. 4. Left lower quadrant colostomy, with persistent wall thickening of the distal colon just proximal to the colostomy site. This may be secondary inflammatory change due to the intraperitoneal process, versus primary colitis. 5. Bilateral pleural effusions and dependent atelectasis, left  greater than right, with slight progression since prior study. 6.  Aortic Atherosclerosis (ICD10-I70.0). Electronically Signed   By: Randa Ngo M.D.   On: 03/28/2022 17:18  ? ?DG CHEST PORT 1 VIEW ? ?Result Date: 03/28/2022 ?CLINICAL DATA:  Fever EXAM: PORTABLE CHEST 1 VIEW COMPARISON:  03/27/2022 FINDINGS: Unchanged AP portable examination with small, layering left pleural effusion. Cardiomegaly. Right upper extremity PICC. IMPRESSION: Unchanged AP portable examination with small, layering left pleural effusion. Cardiomegaly. Electronically Signed   By: Delanna Ahmadi M.D.   On: 03/28/2022 10:34   ? ?Assessment: ?s/p  ?Patient Active Problem List  ? Diagnosis Date Noted  ? Sepsis ([place]) 03/18/2022  ? Ileus following gastrointestinal surgery ([place]) 03/18/2022  ? Normal anion gap metabolic acidosis 74/16/3845  ? Anemia 03/18/2022  ? Post-op pain 03/18/2022  ? Acute urinary retention 03/12/2022  ? 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 Mild persistent asthma 03/12/2022  ? Abdominal pain 03/09/2022  ? Rectal perforation ([place]) 03/09/2022  ? Pruritus ani 04/20/2018  ? Arthritis of both knees 01/13/2018  ? Hx of adenomatous polyp of colon 04/07/2017  ? Perianal dermatitis 01/14/2016  ? 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/12/2013  ? History of tobacco use 11/12/2013  ? Low testosterone 09/19/2013  ? Sinusitis 12/21/2012  ? 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 Eczema 10/30/2012  ? Prolapsed internal hemorrhoids, grade 3 10/30/2012  ? Hypertension 04/13/2011  ? [place], [place] 05/14/2010  ? NEOPLASM, SKIN, UNCERTAIN BEHAVIOR 36/46/8032  ? ALLERGIC RHINITIS 08/16/2008  ? Rash and other nonspecific skin eruption 07/02/2008  ? ABNORMAL COAGULATION PROFILE 05/29/2008  ?  Gastroesophageal reflux disease 06/28/2007  ? PITYRIASIS [place] 06/28/2007  ? OSTEOARTHROSIS, LOCAL NOS, LOWER LEG 06/28/2007  ? Asthma, chronic 05/11/2007  ? ? ?Pelvic sepsis.  Currently drained and debrided.   ? ?OR findings 5/1: Pt with necrosis of  his anoderm posteriorly on EUA, large R gluteal abscess- opened and drained ? ?CT 5/5 shows significant retroperitoneal contamination.   ? ?OR findings 5/6: significant RP infection, drained via L pericol

## 2022-03-29 NOTE — Progress Notes (Signed)
OT Cancellation Note ? ?Patient Details ?Name: Tyler [name] ?MRN: [id number] ?DOB: 1951/10/18 ? ? ?Cancelled Treatment:    Reason Eval/Treat Not Completed: Medical issues which prohibited therapy ?Patient is in RED MEWS at this time. OT to sign off at this time with 4 medical cancellations and one refusal in the past 5 days. Please re order OT services when/if patient is medically ready to participate. Thank you ? ?Maxima Skelton OTR/L, MS ?Acute Rehabilitation Department ?Office# [phone] ?Pager# [phone] ? ?03/29/2022, 6:41 AM ?

## 2022-03-29 NOTE — Progress Notes (Addendum)
Patient transported by MRI per order. Patient premedicated for comfort during scan. Patient temp is 99.1 at this time, no chills. Patient transported off monitor per order.  ?

## 2022-03-30 ENCOUNTER — Inpatient Hospital Stay (HOSPITAL_COMMUNITY): Payer: BC Managed Care – PPO

## 2022-03-30 DIAGNOSIS — R509 Fever, unspecified: Secondary | ICD-10-CM | POA: Diagnosis not present

## 2022-03-30 DIAGNOSIS — A4151 Sepsis due to Escherichia coli [E. coli]: Secondary | ICD-10-CM | POA: Diagnosis not present

## 2022-03-30 DIAGNOSIS — E8770 Fluid overload, unspecified: Secondary | ICD-10-CM

## 2022-03-30 DIAGNOSIS — K631 Perforation of intestine (nontraumatic): Secondary | ICD-10-CM | POA: Diagnosis not present

## 2022-03-30 DIAGNOSIS — E43 Unspecified severe protein-calorie malnutrition: Secondary | ICD-10-CM | POA: Diagnosis not present

## 2022-03-30 LAB — CULTURE, BLOOD (ROUTINE X 2)
Culture: NO GROWTH
Culture: NO GROWTH
Special Requests: ADEQUATE
Special Requests: ADEQUATE

## 2022-03-30 LAB — BASIC METABOLIC PANEL
Anion gap: 7 (ref 5–15)
BUN: 28 mg/dL — ABNORMAL HIGH (ref 8–23)
CO2: 21 mmol/L — ABNORMAL LOW (ref 22–32)
Calcium: 7.6 mg/dL — ABNORMAL LOW (ref 8.9–10.3)
Chloride: 105 mmol/L (ref 98–111)
Creatinine, Ser: 0.96 mg/dL (ref 0.61–1.24)
GFR, Estimated: >60 mL/min (ref >60)
Glucose, Bld: 86 mg/dL (ref 70–99)
Potassium: 4.3 mmol/L (ref 3.5–5.1)
Sodium: 133 mmol/L — ABNORMAL LOW (ref 135–145)

## 2022-03-30 LAB — CBC
HCT: 25.6 % — ABNORMAL LOW (ref 39.0–52.0)
Hemoglobin: 8.7 g/dL — ABNORMAL LOW (ref 13.0–17.0)
MCH: 32.5 pg (ref 26.0–34.0)
MCHC: 34 g/dL (ref 30.0–36.0)
MCV: 95.5 fL (ref 80.0–100.0)
Platelets: 429 10*3/uL — ABNORMAL HIGH (ref 150–400)
RBC: 2.68 MIL/uL — ABNORMAL LOW (ref 4.22–5.81)
RDW: 14.5 % (ref 11.5–15.5)
WBC: 10.4 10*3/uL (ref 4.0–10.5)
nRBC: 0 % (ref 0.0–0.2)

## 2022-03-30 MED ORDER — LIDOCAINE HCL 1 % IJ SOLN
INTRAMUSCULAR | Status: AC
Start: 2022-03-30 — End: 2022-03-30
  Administered 2022-03-30: 10 mL
  Filled 2022-03-30: qty 20

## 2022-03-30 MED ORDER — FUROSEMIDE 10 MG/ML IJ SOLN
40.0000 mg | Freq: Once | INTRAMUSCULAR | Status: AC
  Administered 2022-03-30: 40 mg via INTRAVENOUS
  Filled 2022-03-30: qty 4

## 2022-03-30 MED ORDER — TRAVASOL 10 % IV SOLN
INTRAVENOUS | Status: AC
  Filled 2022-03-30: qty 624

## 2022-03-30 NOTE — Progress Notes (Signed)
?   ? ?[facility] ? ?Date of Admission:  03/09/2022    ?       ?Reason for visit: Follow up on intra-abdominal infection ? ?Current antibiotics: ?Zosyn ? ? ?ASSESSMENT:   ? ?71 y.o. [name] admitted with: ? ?Intra-abdominal infection: Patient admitted with sepsis following initial transanal hemorrhoidal dearterialization 03/05/2022.  Presented this admission on 4/25 with abdominal pain and fever and taken to the OR 5/1 with findings of necrosis of the anoderm posteriorly on EUA and large right gluteal abscess which was opened and drained.  Repeat CT 5/5 given ongoing sepsis physiology showed significant retroperitoneal contamination.  Taken back to the OR 5/6 with findings of significant infection status post creation of diverting colostomy and drain placement x3 as well as a Penrose drain in the peritoneal space.  Cultures from the OR at this time were notable for E. coli, E faecalis, anaerobes.  Follow-up CT scan 5/12 showed edema/inflammation and soft tissue gas in the pelvic floor and presacral space with surgical drains in place.  He did have a 5.3 x 3.5 x Tyler.2 cm fluid collection of the left upper quadrant as well as intramuscular fluid/edema in the thighs left greater than right concerning for possible cellulitis/myositis.  IR was consulted for possible drainage of the left upper quadrant fluid collection, however, this was deemed too risky of an anatomical location to do so.  Another CT scan was obtained 5/14 was notable for overall stability in the pelvis.  The left upper quadrant fluid collection had decreased in size since the prior study and was felt to be contiguous with the left lower quadrant surgical drain. ?Intramuscular left thigh fluid collection: CT 5/14 noted progression of intramuscular fluid collections within the left thigh concerning for intramuscular abscesses.  IR recommended MRI which was attempted, but he was unable to tolerate this. Follow up lower extremity  ultrasound this morning shows what appears consistent with abscesses and now planning for IR aspiration/drainage today. ?Ongoing fevers: Suspect secondary to undrained fluid collections versus possible PICC line as well given that his fever development seems to coincide with PICC placement on 4/28. ?Penicillin allergy: Have discussed at length with patient and partner.  At this time, would rather exclude other ongoing potential causes of fever rather than attribute solely to Zosyn. ?Protein calorie malnutrition: Hopefully can come off TPN tomorrow. ? ?RECOMMENDATIONS:   ? ?Continue Zosyn ?Appreciate IR assistance for management of left thigh abscesses ?Will follow for TPN need.  As soon as possible, would favor removal of PICC line as this may also be contributing to his ongoing fevers ?Wound care ?Lab monitoring ?Following ? ? ?Principal Problem: ?  Rectal perforation ([place]) ?Active Problems: ?  Abdominal pain ?  Acute urinary retention ?  Sepsis ([place]) ?  Ileus following gastrointestinal surgery ([facility]) ?  Normal anion gap metabolic acidosis ?  Anemia ?  Post-op pain ? ? ? ?MEDICATIONS:   ? ?Scheduled Meds: ? (feeding supplement) PROSource Plus  30 mL Oral TID PC & HS  ? acetaminophen  1,000 mg Oral Q6H  ? chlorhexidine  15 mL Mouth Rinse BID  ? Chlorhexidine Gluconate Cloth  6 each Topical Q0600  ? famotidine  20 mg Oral BID  ? feeding supplement  237 mL Oral TID BM  ? ferrous sulfate  325 mg Oral TID  ? HYDROmorphone   Intravenous Q4H  ? lip balm  1 application. Topical BID  ? LORazepam  0.25 mg Intravenous Q8H  ?  mouth rinse  15 mL Mouth Rinse q12n4p  ? metoprolol tartrate  5 mg Intravenous Q6H  ? multivitamin with minerals  1 tablet Oral Daily  ? sodium chloride flush  10-40 mL Intracatheter Q12H  ? ?Continuous Infusions: ? sodium chloride 10 mL/hr at 03/30/22 1200  ? chlorproMAZINE (THORAZINE) IV    ? methocarbamol (ROBAXIN) IV Stopped (03/29/22 1806)  ? ondansetron (ZOFRAN) IV    ? piperacillin-tazobactam  (ZOSYN)  IV Stopped (03/30/22 1015)  ? TPN ADULT (ION) 50 mL/hr at 03/30/22 1200  ? TPN ADULT (ION)    ? ?PRN Meds:.sodium chloride, albuterol, azelastine, chlorproMAZINE (THORAZINE) IV, diphenhydrAMINE, enalaprilat, gadobutrol, ibuprofen, LORazepam, magic mouthwash, menthol-cetylpyridinium, methocarbamol (ROBAXIN) IV, naloxone **AND** sodium chloride flush, ondansetron (ZOFRAN) IV **OR** ondansetron (ZOFRAN) IV, ondansetron **OR** [DISCONTINUED] ondansetron (ZOFRAN) IV, simethicone, sodium chloride flush ? ?SUBJECTIVE:  ? ?24 hour events:  ?Febrile, Tmax 102.6 ?Unable to tolerate full MRI overnight which was without contrast and unremarkable ?IR did a soft tissue ultrasound today which showed multifocal irregularly-shaped complex fluid collections within the mid to distal left posterior lateral thigh muscle felt to represent abscess.  Planning for IR guided aspiration/drainage later today ?Continued on Zosyn ?Able to drink 2 ensures yesterday plus meals ? ?Feels tired and overwhelmed.  His partner is at the bedside. Waiting on IR procedure.  ? ?Review of Systems  ?All other systems reviewed and are negative. ? ?  ?OBJECTIVE:  ? ?Blood pressure 128/79, pulse (!) 103, temperature 98.6 ?F (37 ?C), temperature source Oral, resp. rate (!) 28, height '5\' 9"'$  (1.753 m), weight 88.6 kg, SpO2 94 %. ?Body mass index is 28.84 kg/m?. ? ?Physical Exam ? ? ?Lab Results: ?Lab Results  ?Component Value Date  ? WBC 10.4 03/30/2022  ? HGB 8.Tyler (L) 03/30/2022  ? HCT 25.6 (L) 03/30/2022  ? MCV 95.5 03/30/2022  ? PLT 429 (H) 03/30/2022  ?  ?Lab Results  ?Component Value Date  ? NA 133 (L) 03/30/2022  ? K 4.3 03/30/2022  ? CO2 21 (L) 03/30/2022  ? GLUCOSE 86 03/30/2022  ? BUN 28 (H) 03/30/2022  ? CREATININE 0.96 03/30/2022  ? CALCIUM Tyler.6 (L) 03/30/2022  ? GFRNONAA >60 03/30/2022  ? GFRAA  03/23/2022  ?  QUESTIONABLE RESULTS, RECOMMEND RECOLLECT TO VERIFY  ?  ?Lab Results  ?Component Value Date  ? ALT 16 03/29/2022  ? AST 24 03/29/2022  ?  ALKPHOS 215 (H) 03/29/2022  ? BILITOT 0.5 03/29/2022  ? ? ?No results found for: CRP ? ?No results found for: ESRSEDRATE ?  ?I have reviewed the micro and lab results in Epic. ? ?Imaging: ?CT ABDOMEN PELVIS W CONTRAST ? ?Result Date: 03/28/2022 ?CLINICAL DATA:  Abdominal pain, recent diverting colostomy. EXAM: CT ABDOMEN AND PELVIS WITH CONTRAST TECHNIQUE: Multidetector CT imaging of the abdomen and pelvis was performed using the standard protocol following bolus administration of intravenous contrast. RADIATION DOSE REDUCTION: This exam was performed according to the departmental dose-optimization program which includes automated exposure control, adjustment of the mA and/or kV according to patient size and/or use of iterative reconstruction technique. CONTRAST:  137m OMNIPAQUE IOHEXOL 300 MG/ML  SOLN COMPARISON:  03/26/2022 FINDINGS: Lower chest: Small bilateral pleural effusions and dependent lower lobe atelectasis, left greater than right. Stable a cardiomegaly. Hepatobiliary: No focal liver abnormality is seen. No gallstones, gallbladder wall thickening, or biliary dilatation. Pancreas: Unremarkable. No pancreatic ductal dilatation or surrounding inflammatory changes. Spleen: Normal in size without focal abnormality. Adrenals/Urinary Tract: Kidneys enhance normally and symmetrically. The adrenals are  unremarkable. Bladder is decompressed with a Foley catheter. Stomach/Bowel: Left lower quadrant diverting colostomy is again identified, with mild distal colonic wall thickening just proximal to the colostomy site again noted. No evidence of bowel obstruction or ileus. Vascular/Lymphatic: Stable aortic atherosclerosis. Multiple subcentimeter lymph nodes in the mesentery are likely reactive. No pathologic adenopathy. Reproductive: Prostate is unremarkable. Other: Persistent inflammatory changes in the perirectal and presacral tissues, with soft tissue gas seen in the presacral space and extending along the bilateral  pelvic sidewalls unchanged. There is a surgical drain in the presacral space via a right gluteal approach unchanged. There are 3 additional surgical drains. Most inferior drain is within the lower anterior abdom

## 2022-03-30 NOTE — Progress Notes (Signed)
PT Cancellation Note ? ?Patient Details ?Name: Tyler [name] ?MRN: [id number] ?DOB: 06/25/1951 ? ? ?Cancelled Treatment:    Reason Eval/Treat Not Completed: Medical issues which prohibited therapy, has procedure planned today. Will check back another time. ? ? ?Cressida Milford, Shella Maxim ?03/30/2022, 12:03 PM ?Tresa Endo PT ?Acute Rehabilitation Services ?Pager [phone] ?Office [phone] ? ?

## 2022-03-30 NOTE — TOC Progression Note (Signed)
Transition of Care (TOC) - Progression Note  ? ? ?Patient Details  ?Name: Tyler [name] ?MRN: [id number] ?Date of Birth: 1951/07/20 ? ?Transition of Care (TOC) CM/SW Contact  ?Leeroy Cha, RN ?Phone Number: ?03/30/2022, 7:21 AM ? ?Clinical Narrative:    ?On iv meds, temp this am of 102.6, Iv dilaud pca, iv abx.  Following for toc needs. ? ? ?  ?Barriers to Discharge: Continued Medical Work up ? ?Expected Discharge Plan and Services ?  ?  ?  ?  ?  ?                ?  ?  ?  ?  ?  ?  ?  ?  ?  ?  ? ? ?Social Determinants of Health (SDOH) Interventions ?  ? ?Readmission Risk Interventions ?   ? View : No data to display.  ?  ?  ?  ? ? ?

## 2022-03-30 NOTE — Progress Notes (Signed)
PHARMACY - TOTAL PARENTERAL NUTRITION CONSULT NOTE  ? ?Indication: rectal perforation, prolonged NPO ? ?Patient Measurements: ?Height: _0  (175.3 cm) ?Weight: 88.6 kg (195 lb 5.2 oz) ?IBW/kg (Calculated) : 70.7 ?TPN AdjBW (KG): 82.2 ?Body mass index is 28.84 kg/m?. ? ?Assessment: 71 yo male with history of rectal bleeding and prolapsed internal hemorrhoids who recently underwent hemorrhoid surgery w/ hemorrhoidal dearterialization on 03/05/22.  He presented to [facility] on 4/25 with increasing abdominal pain, nausea, and fever.  CT abdomen/pelvis with thickening of rectal wall; gas and fluid within the rectal wall and in presacral space concerning for rectal perforation.  Pharmacy has been consulted to manage TPN as patient unable to tolerate PO. ? ?Glucose / Insulin: no history of DM, CBG/SSI d/c 5/12.  Glucose on chem < 150 ?Electrolytes:  Na 133, CO2 21, others WNL ?Renal: stable, Scr <1, BUN 28 ?Hepatic: AST/ALT, Tbili WNL.  Albumin <1.5, TG 111, alk phos rising to 215 (5/15) ?Intake / Output: I/O net -1.4 L ?- Output:  528 ml stool, 220 ml drain, 4.5L urine ?- Intake: Ensure Plus x2, Prosource protein x2 ?GI Imaging: ?- 4/25 CTAP: rectal wall thickening w/ gas tracking into the anterior rectal wall, presacral fluid as well as stranding and gas in the mesorectal fat; consistent w/ rectal perforation ?- 4/30 CT scan: extraluminal air tracking into mesenteric space but no free peritoneal fluid   ?- 5/5 CT: Progressive Necrotizing Infection, ileus.  ?5/12 CTAP: 5.3x3.5x7.2 cm gas collection in the LUQ near the kidney.   ?5/14 CTAP: Persistent but slightly decreased gas/fluid collection anterior to the left kidney.  Progression of IM fluid collections within left thigh.  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 ?Central  access: PICC placed 4/28 ?TPN start date: 03/12/22 ? ?Nutritional Goals: ?Goal TPN rate is 100 mL/hr (provides 125 g of protein and 2395 kcals per day) ? ?RD Assessment:   ?Estimated Needs ?Total Energy Estimated Needs: 2400-2700 kcal ?Total Protein Estimated Needs: 125-140 grams ?Total Fluid Estimated Needs: >/= 2.5 L/day ? ? ?Current Nutrition:  ?TPN - reduced to 1/2 rate on 5/15 (62g protein, 1197 kcal) ?Mechanical soft diet ?Supplements per RD:  Ensure TID and Prosource QID (120g protein, 1450 kcals) ? ?Plan:  ?Continue TPN at 70m/hr for today  ?- Per Dr. TManon Hildingnote 5/16, Wean TPN.  Plan for 1 more day of half rate TPN, then off ?Electrolytes in TPN:  ?Na 60 mEq/L  ?K  666m/L ?Ca 67m71mL ?Mg 8 mEq/L  ?Phos 20 mmol/L  ?Cl:Ac 1:2 ?Continue PO MVI & PO pepcid  (taking PO meds) ?Monitor TPN labs on Mon/Thurs and PRN ? ?ChrGretta ArabarmD, BCPS ?Clinical Pharmacist ?[facility] [phone]/16/2023 7:08 AM ? ? ?

## 2022-03-30 NOTE — Progress Notes (Addendum)
10 Days Post-Op ex lap, ostomy creation and drain placement ?Subjective: ?More fevers with Zosyn added, drank 2 Ensures yesterday plus meals ?Objective: ?Vital signs in last 24 hours: ?Temp:  [97.9 ?F (36.6 ?C)-102.6 ?F (39.2 ?C)] 97.9 ?F (36.6 ?C) (05/16 6045) ?Pulse Rate:  [92-116] 109 (05/16 0600) ?Resp:  [20-34] 28 (05/16 0817) ?BP: (128-156)/(79-100) 141/97 (05/16 0600) ?SpO2:  [92 %-99 %] 92 % (05/16 0817) ?FiO2 (%):  [0 %] 0 % (05/16 0817) ? ? ?Intake/Output from previous day: ?05/15 0701 - 05/16 0700 ?In: 2505 [P.O.:300; I.V.:1828.1; IV Piggyback:376.8] ?Out: 4223 [WUJWJ:1914; Drains:220; NWGNF:621] ?Intake/Output this shift: ?No intake/output data recorded. ? ? ?General appearance: alert and cooperative ?GI: soft, non-distended, less TTP ?Pelvis JP with purulent fluid ?Sub Q and RP drains with cloudy, purulent fluid ?L thigh: no TTP ?Wet to dry dressing in place ?Colostomy viable with stool and air ? ?Lab Results:  ?Recent Labs  ?  03/29/22 ?0430 03/30/22 ?0527  ?WBC 7.4 10.4  ?HGB 7.8* 8.7*  ?HCT 23.8* 25.6*  ?PLT 347 429*  ? ? ?BMET ?Recent Labs  ?  03/29/22 ?0610 03/30/22 ?0527  ?NA 131* 133*  ?K 4.1 4.3  ?CL 105 105  ?CO2 20* 21*  ?GLUCOSE 93 86  ?BUN 24* 28*  ?CREATININE 0.86 0.96  ?CALCIUM 7.4* 7.6*  ? ? ?PT/INR ?No results for input(s): LABPROT, INR in the last 72 hours. ? ?ABG ?Recent Labs  ?  03/28/22 ?0843  ?PHART 7.5*  ?HCO3 19.5*  ? ? ? ?MEDS, Scheduled ? (feeding supplement) PROSource Plus  30 mL Oral TID PC & HS  ? acetaminophen  1,000 mg Oral Q6H  ? chlorhexidine  15 mL Mouth Rinse BID  ? Chlorhexidine Gluconate Cloth  6 each Topical Q0600  ? famotidine  20 mg Oral BID  ? feeding supplement  237 mL Oral TID BM  ? ferrous sulfate  325 mg Oral TID  ? furosemide  40 mg Intravenous Once  ? HYDROmorphone   Intravenous Q4H  ? lip balm  1 application. Topical BID  ? LORazepam  0.25 mg Intravenous Q8H  ? mouth rinse  15 mL Mouth Rinse q12n4p  ? metoprolol tartrate  5 mg Intravenous Q6H  ?  multivitamin with minerals  1 tablet Oral Daily  ? sodium chloride flush  10-40 mL Intracatheter Q12H  ? ? ?Studies/Results: ?CT ABDOMEN PELVIS W CONTRAST ? ?Result Date: 03/28/2022 ?CLINICAL DATA:  Abdominal pain, recent diverting colostomy. EXAM: CT ABDOMEN AND PELVIS WITH CONTRAST TECHNIQUE: Multidetector CT imaging of the abdomen and pelvis was performed using the standard protocol following bolus administration of intravenous contrast. RADIATION DOSE REDUCTION: This exam was performed according to the departmental dose-optimization program which includes automated exposure control, adjustment of the mA and/or kV according to patient size and/or use of iterative reconstruction technique. CONTRAST:  165m OMNIPAQUE IOHEXOL 300 MG/ML  SOLN COMPARISON:  03/26/2022 FINDINGS: Lower chest: Small bilateral pleural effusions and dependent lower lobe atelectasis, left greater than right. Stable a cardiomegaly. Hepatobiliary: No focal liver abnormality is seen. No gallstones, gallbladder wall thickening, or biliary dilatation. Pancreas: Unremarkable. No pancreatic ductal dilatation or surrounding inflammatory changes. Spleen: Normal in size without focal abnormality. Adrenals/Urinary Tract: Kidneys enhance normally and symmetrically. The adrenals are unremarkable. Bladder is decompressed with a Foley catheter. Stomach/Bowel: Left lower quadrant diverting colostomy is again identified, with mild distal colonic wall thickening just proximal to the colostomy site again noted. No evidence of bowel obstruction or ileus. Vascular/Lymphatic: Stable aortic atherosclerosis. Multiple subcentimeter  lymph nodes in the mesentery are likely reactive. No pathologic adenopathy. Reproductive: Prostate is unremarkable. Other: Persistent inflammatory changes in the perirectal and presacral tissues, with soft tissue gas seen in the presacral space and extending along the bilateral pelvic sidewalls unchanged. There is a surgical drain in the  presacral space via a right gluteal approach unchanged. There are 3 additional surgical drains. Most inferior drain is within the lower anterior abdominal wall within the rectus musculature, with no surrounding fluid collection. Two other drains entering the peritoneal space view of the right lower quadrant and left lower quadrant are coiled within the lower pelvis. The drains are unchanged in appearance. Inflammatory changes are again seen within the left lower quadrant mesentery. A stable gas and fluid collection anterior to the left kidney is again identified, measuring approximately 4.8 x 4.0 x 7.2 cm, slightly decreased in size since prior study. This is likely contiguous with the area drained by the left lower quadrant surgical drain. Musculoskeletal: No acute or destructive bony lesions. Intramuscular gas/fluid collections are seen within the medial and posterior proximal left thigh, concerning for intramuscular abscesses. Largest collection within the hamstring musculature proximal left thigh image 106/2 measures 5.1 x 3.1 cm. Reconstructed images demonstrate no additional findings. IMPRESSION: 1. Persistent gas and fluid collection anterior to the left kidney, likely contiguous with the area drain by the left lower quadrant surgical drain. Slight interval decrease in size since prior study. 2. Extensive inflammatory changes within the presacral space and along the pelvic sidewall, with numerous surgical drains as above, without significant change since prior study. 3. Interval progression of intramuscular fluid collections within the medial and posterior musculature of the proximal left thigh, concerning for intramuscular abscesses. Largest collection within the left hamstring musculature as above. 4. Left lower quadrant colostomy, with persistent wall thickening of the distal colon just proximal to the colostomy site. This may be secondary inflammatory change due to the intraperitoneal process, versus  primary colitis. 5. Bilateral pleural effusions and dependent atelectasis, left greater than right, with slight progression since prior study. 6.  Aortic Atherosclerosis (ICD10-I70.0). Electronically Signed   By: Randa Ngo M.D.   On: 03/28/2022 17:18  ? ?MR FEMUR LEFT WO CONTRAST ? ?Result Date: 03/29/2022 ?CLINICAL DATA:  History of hemorrhoid surgery and now has left thigh pain from hip to the knee. EXAM: MR OF THE LEFT FEMUR WITHOUT CONTRAST TECHNIQUE: Multiplanar, multisequence MR imaging of the left femur was performed. No intravenous contrast was administered. COMPARISON:  None Available. FINDINGS: Incomplete examination, patient was unable to tolerate and complete examination. Only axial T1 and coronal T1 and STIR images were obtained. Bones/Joint/Cartilage No appreciable fracture of the partially imaged left femur. No appreciable joint effusion. Degenerative changes of the left hip joint without evidence of significant joint effusion. Muscles and Tendons Muscles are normal in bulk and signal. No intramuscular hematoma or significant atrophy. Soft tissues Skin and subcutaneous soft tissues are within normal limits. IMPRESSION: Limited examination of the left femur. Within the above-mentioned limitations no acute osseous or soft tissue abnormality. Electronically Signed   By: Keane Police D.O.   On: 03/29/2022 21:59   ? ?Assessment: ?s/p  ?Patient Active Problem List  ? Diagnosis Date Noted  ? Sepsis ([place]) 03/18/2022  ? Ileus following gastrointestinal surgery ([place]) 03/18/2022  ? Normal anion gap metabolic acidosis 10/93/2355  ? Anemia 03/18/2022  ? Post-op pain 03/18/2022  ? Acute urinary retention 03/12/2022  ? 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 Mild  persistent asthma 03/12/2022  ? Abdominal pain 03/09/2022  ? Rectal perforation ([place]) 03/09/2022  ? Pruritus ani 04/20/2018  ? Arthritis of both knees 01/13/2018  ? Hx of adenomatous polyp of colon 04/07/2017  ? Perianal dermatitis  01/14/2016  ? 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/12/2013  ? History of tobacco use 11/12/2013  ? Low testosterone 09/19/2013  ? Sinusitis 12/21/2012  ? 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 Eczema 10/30/2012  ? Prolapsed internal

## 2022-03-30 NOTE — Progress Notes (Signed)
Pt ate breakfast around 0900 and has been sipping on Ensure most of the morning. US guided left thigh fluid collection aspiration/drainage procedure was explained to patient. Pt is in agreement to proceed with local anesthetic only later today.  ?Consent signed, in chart.  ? ? ?Narda Rutherford, AGNP-BC ?03/30/2022, 11:39 AM ? ? ?

## 2022-03-30 NOTE — Progress Notes (Signed)
? ?NAME:  Tyler [name], MRN:  [id number], DOB:  June 01, 1951, LOS: 65 ?ADMISSION DATE:  03/09/2022, CONSULTATION DATE:  03/30/22 ?REFERRING MD:  Dr. Marcello Moores, Surgery CHIEF COMPLAINT:  sepsis  ? ?History of Present Illness:  ?71 yo male former smoker had transanal hemorrhoidal dearterialization on 4/21.  Presented to [facility] ER on 4/25 with nausea, fever, and abdominal pain.  CT abd/pelvis showed rectal wall thickening.  Admit by surgery and started on antibiotics.  Had persistent pain and fever.  Repeat CT abdomen/pelvis showed possible perforation.  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  He had initial improvement, but then had recurrent symptoms.  He was taken to the OR on 5/06 and had diagnostic laparotomy, abdominal wash out, drain placement and diverting colostomy.  PCCM consulted to assist with medical management in ICU. ? ?Pertinent  Medical History  ?Allergies, Shingles, ED, GERD, Pityriasis rosea, Thrombocytopenia, Colon polyp, HTN, Asthma, Osteoarthritis ? ?Significant Hospital Events:   ?4/21 hemorrhoidal dearterialization ?4/25 admitted to surgery with fever and pain, thickening of rectal wall  ?5/01 Taken to OR for anal canal I&D and rectal wall necrosis debridement ?5/03 signs of worsening sepsis, transfer to progressive and PCCM consult ?5/04 HR better end organ fxn stable ?5/06 Has worsening infection on CT scan and patient taken back to OR for diverting colostomy with ostomy creation and abd wash out.  PCCM reconsulted for hypotension.  ?5/6-5/9 PCCM followed for possible evolving sepsis. Has had intermittent fevers however leukocytosis has resolved and clinically improving ?5/14 PCCM reconsulted for tachycardia and possible sepsis; IR and ID consulted ?5/16 Tm 102.6 F, MRI Lt femur negative for abscess ? ?Interim History / Subjective:  ?Fever overnight.  Responds better to NSAIDs.  Feels short of breath and legs feel  heavy. ? ?Objective   ?Blood pressure (!) 141/97, pulse (!) 109, temperature 97.9 ?F (36.6 ?C), temperature source Axillary, resp. rate (!) 28, height '5\' 9"'$  (1.753 m), weight 88.6 kg, SpO2 92 %. ?CVP:  [5 mmHg-10 mmHg] 10 mmHg  ?FiO2 (%):  [0 %] 0 %  ? ?Intake/Output Summary (Last 24 hours) at 03/30/2022 0839 ?Last data filed at 03/30/2022 0630 ?Gross per 24 hour  ?Intake 2504.95 ml  ?Output 4223 ml  ?Net -1718.05 ml  ? ?Filed Weights  ? 03/18/22 0800 03/24/22 1208 03/26/22 0400  ?Weight: 82.2 kg 73 kg 88.6 kg  ? ? ?Physical Exam: ? ?General - anxious ?Eyes - pupils reactive ?ENT - no stridor ?Cardiac - regular, tachycardic ?Chest - decreased BS at bases ?Abdomen - colostomy LLQ, drains in place ?Extremities - 1+ edema ?Skin - no rashes ?Neuro - alert, follows commands, moves all extremities ?GU - scrotal edema ? ?Assessment & Plan:  ? ?Sepsis 2nd to anal abscess with E coli and Enterococcus faecalis in wound culture. ?Persistent fever. ?- Day 22 of Abx, changed to zosyn on 5/15 per ID ?- ?if PICC line could be source of fever >> site looks okay; would help if he can transition off TPN and then have PICC line removed ?- post op care, nutrition, pain control per surgery ? ?Anemia from GI blood loss and critical illness. ?- f/u CBC ?- transfuse for Hb < 7  ?- continue ferrous sulfate ? ?Sinus tachycardia, hypervolemia.Marland Kitchen ?Hx of hypertension. ?- scheduled lopressor ?- lasix 40 mg IV x one on 5/16 ? ?Hx of allergic asthma. ?- prn albuterol ? ?Anxiety. ?- scheduled ativan ? ?  Best Practice (right click and "Reselect all SmartList Selections" daily)  ? ?Diet/type: Regular consistency (see orders) and TPN ?DVT prophylaxis: SCD  ?GI prophylaxis: PPI ?Lines: yes and it is still needed, double lumen Rt PICC 03/12/22 ?Foley:  Yes, and it is still needed ?Code Status:  full code ?Last date of multidisciplinary goals of care discussion [per primary ] ? ?Labs:  ? ? ?  Latest Ref Rng & Units 03/30/2022  ?  5:27 AM 03/29/2022  ?  6:10 AM  03/28/2022  ?  8:50 AM  ?CMP  ?Glucose 70 - 99 mg/dL 86   93   99    ?BUN 8 - 23 mg/dL '28   24   28    '$ ?Creatinine 0.61 - 1.24 mg/dL 0.96   0.86   0.98    ?Sodium 135 - 145 mmol/L 133   131   133    ?Potassium 3.5 - 5.1 mmol/L 4.3   4.1   4.2    ?Chloride 98 - 111 mmol/L 105   105   107    ?CO2 22 - 32 mmol/L '21   20   20    '$ ?Calcium 8.9 - 10.3 mg/dL 7.6   7.4   7.2    ?Total Protein 6.5 - 8.1 g/dL  5.6   5.5    ?Total Bilirubin 0.3 - 1.2 mg/dL  0.5   0.6    ?Alkaline Phos 38 - 126 U/L  215   206    ?AST 15 - 41 U/L  24   22    ?ALT 0 - 44 U/L  16   15    ? ? ? ?  Latest Ref Rng & Units 03/30/2022  ?  5:27 AM 03/29/2022  ?  4:30 AM 03/28/2022  ?  4:57 AM  ?CBC  ?WBC 4.0 - 10.5 K/uL 10.4   7.4   10.0    ?Hemoglobin 13.0 - 17.0 g/dL 8.7   7.8   8.1    ?Hematocrit 39.0 - 52.0 % 25.6   23.8   24.5    ?Platelets 150 - 400 K/uL 429   347   363    ? ? ?ABG ?   ?Component Value Date/Time  ? PHART 7.5 (H) 03/28/2022 0843  ? PCO2ART 25 (L) 03/28/2022 0843  ? PO2ART 61 (L) 03/28/2022 0843  ? HCO3 19.5 (L) 03/28/2022 0843  ? TCO2 26 03/05/2022 1110  ? ACIDBASEDEF 2.1 (H) 03/28/2022 0843  ? O2SAT 95.8 03/28/2022 0843  ? ? ?CBG (last 3)  ?Recent Labs  ?  03/29/22 ?0727  ?GLUCAP 123*  ? ? ?Signature:  ?Chesley Mires, MD ?[place] ?Pager - [phone] ?03/30/2022, 8:39 AM ? ? ?

## 2022-03-30 NOTE — Procedures (Signed)
Interventional Radiology Procedure Note ? ?Procedure: Ultrasound guided left posterior thigh aspiration ? ?Findings: Please refer to procedural dictation for full description. Phlegmonous changes throughout left thigh.  Largest pocket of fluid targeted for aspiration, yielding approximately 10 mL purulent fluid. ? ?Complications: None immediate ? ?Estimated Blood Loss: <5 mL ? ?Recommendations: ?Follow up culture. ? ? ?Ruthann Cancer, MD ? ? ?

## 2022-03-31 DIAGNOSIS — L02416 Cutaneous abscess of left lower limb: Secondary | ICD-10-CM

## 2022-03-31 DIAGNOSIS — E8770 Fluid overload, unspecified: Secondary | ICD-10-CM | POA: Diagnosis not present

## 2022-03-31 DIAGNOSIS — R509 Fever, unspecified: Secondary | ICD-10-CM | POA: Diagnosis not present

## 2022-03-31 DIAGNOSIS — K631 Perforation of intestine (nontraumatic): Secondary | ICD-10-CM | POA: Diagnosis not present

## 2022-03-31 LAB — CBC
HCT: 24.7 % — ABNORMAL LOW (ref 39.0–52.0)
Hemoglobin: 8.1 g/dL — ABNORMAL LOW (ref 13.0–17.0)
MCH: 31.3 pg (ref 26.0–34.0)
MCHC: 32.8 g/dL (ref 30.0–36.0)
MCV: 95.4 fL (ref 80.0–100.0)
Platelets: 470 10*3/uL — ABNORMAL HIGH (ref 150–400)
RBC: 2.59 MIL/uL — ABNORMAL LOW (ref 4.22–5.81)
RDW: 14.1 % (ref 11.5–15.5)
WBC: 11 10*3/uL — ABNORMAL HIGH (ref 4.0–10.5)
nRBC: 0 % (ref 0.0–0.2)

## 2022-03-31 LAB — BASIC METABOLIC PANEL
Anion gap: 8 (ref 5–15)
BUN: 29 mg/dL — ABNORMAL HIGH (ref 8–23)
CO2: 23 mmol/L (ref 22–32)
Calcium: 7.6 mg/dL — ABNORMAL LOW (ref 8.9–10.3)
Chloride: 104 mmol/L (ref 98–111)
Creatinine, Ser: 1.15 mg/dL (ref 0.61–1.24)
GFR, Estimated: >60 mL/min (ref >60)
Glucose, Bld: 116 mg/dL — ABNORMAL HIGH (ref 70–99)
Potassium: 3.8 mmol/L (ref 3.5–5.1)
Sodium: 135 mmol/L (ref 135–145)

## 2022-03-31 MED ORDER — TRAVASOL 10 % IV SOLN
INTRAVENOUS | Status: AC
  Filled 2022-03-31: qty 624

## 2022-03-31 MED ORDER — FUROSEMIDE 10 MG/ML IJ SOLN
40.0000 mg | Freq: Once | INTRAMUSCULAR | Status: AC
  Administered 2022-03-31: 40 mg via INTRAVENOUS
  Filled 2022-03-31: qty 4

## 2022-03-31 NOTE — Evaluation (Signed)
Physical Therapy Evaluation ?Patient Details ?Name: Tyler [name] ?MRN: [id number] ?DOB: October 11, 1951 ?Today's Date: 03/31/2022 ? ?History of Present Illness ? 71 yo male former smoker had transanal hemorrhoidal dearterialization on 4/21.  Presented to [facility] ER on 4/25 with nausea, fever, and abdominal pain.  Repeat CT abdomen/pelvis showed possible perforation.  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 He was taken to the OR on 5/06 and had diagnostic laparotomy, abdominal wash out, drain placement and diverting colostomy.  5/14 PCCM reconsulted for tachycardia and possible sepsis.  MRI Lt femur negative for abscess but ultrasound positive for fluid collection aspirated by IR ? ?  ?Clinical Impression ? Tyler [name] is 71 y.o. male admitted with above HPI and diagnosis. Patient is currently limited by functional impairments below (see PT problem list). Patient lives with his partner and is independent at baseline. Patient currently is required 2+ Mod Assist for bed mobility and Min +2 for transfers. He was able to move to recliner today and tolerated being on RA, HR in 120's with activity and BP stable. Pt is highly motivated to improve function and mobility. Patient has excellent support at home and partner is able to assist him. Patient will benefit from continued skilled PT interventions to address impairments and progress independence with mobility, recommending intense follow up rehab at AIR setting. Acute PT will follow and progress as able.    ?   ? ?Recommendations for follow up therapy are one component of a multi-disciplinary discharge planning process, led by the attending physician.  Recommendations may be updated based on patient status, additional functional criteria and insurance authorization. ? ?Follow Up Recommendations Acute inpatient rehab (3hours/day) ? ?  ?Assistance Recommended at Discharge    ?Patient can return home with  the following ? A lot of help with walking and/or transfers;A lot of help with bathing/dressing/bathroom;Assistance with cooking/housework;Direct supervision/assist for medications management;Assist for transportation;Help with stairs or ramp for entrance ? ?  ?Equipment Recommendations  (TBA)  ?Recommendations for Other Services ? Rehab consult  ?  ?Functional Status Assessment Patient has had a recent decline in their functional status and demonstrates the ability to make significant improvements in function in a reasonable and predictable amount of time.  ? ?  ?Precautions / Restrictions Precautions ?Precautions: Fall ?Precaution Comments: 3 drains, colostomy ?Restrictions ?Weight Bearing Restrictions: No  ? ?  ? ?Mobility ? Bed Mobility ?Overal bed mobility: Needs Assistance ?Bed Mobility: Rolling, Sidelying to Sit ?Rolling: Min assist, +2 for safety/equipment ?Sidelying to sit: Mod assist, +2 for safety/equipment, HOB elevated ?  ?  ?  ?General bed mobility comments: Min assist with pt initiating roll to Rt side and Mod Assist to press up trunk and control lowering to bring LE's off EOB ?  ? ?Transfers ?Overall transfer level: Needs assistance ?Equipment used: Rolling walker (2 wheels) ?Transfers: Sit to/from Stand, Bed to chair/wheelchair/BSC ?Sit to Stand: Min assist, +2 physical assistance, +2 safety/equipment, From elevated surface ?  ?Step pivot transfers: Min assist, +2 physical assistance, +2 safety/equipment ?  ?  ?  ?General transfer comment: Min +2 assist for rise and to steady, verbal/manual cuse and blocking to position feet for stand. Min +2 assist to manage IV and pivot walker while guiding pt's steps to reclienr. assist to control lowering to chair. ?  ? ?Ambulation/Gait ?  ?  ?  ?  ?  ?  ?  ?  ? ?  Stairs ?  ?  ?  ?  ?  ? ?Wheelchair Mobility ?  ? ?Modified Rankin (Stroke Patients Only) ?  ? ?  ? ?Balance Overall balance assessment: Needs assistance ?Sitting-balance support: Feet supported, No  upper extremity supported ?Sitting balance-Leahy Scale: Fair ?  ?  ?Standing balance support: During functional activity ?Standing balance-Leahy Scale: Poor ?Standing balance comment: reliant on external assistance ?  ?  ?  ?  ?  ?  ?  ?  ?  ?  ?  ?   ? ? ? ?Pertinent Vitals/Pain Pain Assessment ?Pain Assessment: Faces ?Faces Pain Scale: Hurts even more ?Pain Location: abdomen/generalized ?Pain Descriptors / Indicators: Grimacing, Guarding ?Pain Intervention(s): Limited activity within patient's tolerance, Monitored during session, Repositioned  ? ? ?Home Living Family/patient expects to be discharged to:: Inpatient rehab ?Living Arrangements: Spouse/significant other ?Available Help at Discharge: Family ?Type of Home: House ?Home Access: Stairs to enter ?  ?Entrance Stairs-Number of Steps: 3 ?  ?  ?Home Equipment: None ?   ?  ?Prior Function Prior Level of Function : Independent/Modified Independent ?  ?  ?  ?  ?  ?  ?  ?  ?  ? ? ?Hand Dominance  ? Dominant Hand: Right ? ?  ?Extremity/Trunk Assessment  ? Upper Extremity Assessment ?Upper Extremity Assessment: Defer to OT evaluation ?RUE Deficits / Details: WFL ROM, 5/5 strength ?RUE Sensation: WNL ?RUE Coordination: WNL ?LUE Deficits / Details: WFL ROM, 5/5 strength ?LUE Sensation: WNL ?LUE Coordination: WNL ?  ? ?Lower Extremity Assessment ?Lower Extremity Assessment: RLE deficits/detail;LLE deficits/detail ?RLE Deficits / Details: grossly WFL for ROM, 3/5 for MMT with hip flex/ext, 4/5 dorsiflexion, 3/5 plantarflexion ?RLE Sensation: WNL ?RLE Coordination: decreased gross motor (secondary to weakness) ?LLE Deficits / Details: grossly WFL for ROM, 3/5 for MMT with hip flex/ext, 4/5 dorsiflexion, 3/5 plantarflexion ?LLE Sensation: WNL ?LLE Coordination: decreased gross motor (secondary to weakness) ?  ? ?Cervical / Trunk Assessment ?Cervical / Trunk Assessment: Normal  ?Communication  ? Communication: No difficulties  ?Cognition Arousal/Alertness:  Awake/alert ?Behavior During Therapy: [facility] for tasks assessed/performed ?Overall Cognitive Status: Within Functional Limits for tasks assessed ?  ?  ?  ?  ?  ?  ?  ?  ?  ?  ?  ?  ?  ?  ?  ?  ?  ?  ?  ? ?  ?General Comments   ? ?  ?Exercises    ? ?Assessment/Plan  ?  ?PT Assessment Patient needs continued PT services  ?PT Problem List Decreased strength;Decreased range of motion;Decreased activity tolerance;Decreased balance;Decreased mobility;Decreased knowledge of use of DME;Decreased knowledge of precautions;Pain ? ?   ?  ?PT Treatment Interventions DME instruction;Gait training;Stair training;Functional mobility training;Therapeutic activities;Therapeutic exercise;Balance training;Neuromuscular re-education;Patient/family education   ? ?PT Goals (Current goals can be found in the Care Plan section)  ?Acute Rehab PT Goals ?Patient Stated Goal: get back strength and independence ?PT Goal Formulation: With patient ?Time For Goal Achievement: 04/14/22 ?Potential to Achieve Goals: Good ? ?  ?Frequency Min 4X/week ?  ? ? ?Co-evaluation   ?  ?  ?  ?  ? ? ?  ?AM-PAC PT "6 Clicks" Mobility  ?Outcome Measure Help needed turning from your back to your side while in a flat bed without using bedrails?: A Lot ?Help needed moving from lying on your back to sitting on the side of a flat bed without using bedrails?: A Lot ?Help needed moving to and from a bed  to a chair (including a wheelchair)?: A Lot ?Help needed standing up from a chair using your arms (e.g., wheelchair or bedside chair)?: A Lot ?Help needed to walk in hospital room?: Total ?Help needed climbing 3-5 steps with a railing? : Total ?6 Click Score: 10 ? ?  ?End of Session Equipment Utilized During Treatment: Gait belt ?Activity Tolerance: Patient tolerated treatment well ?Patient left: in chair;with call bell/phone within reach;with nursing/sitter in room ?Nurse Communication: Mobility status ?PT Visit Diagnosis: Muscle weakness (generalized) (M62.81);Difficulty in  walking, not elsewhere classified (R26.2);Unsteadiness on feet (R26.81) ?  ? ?Time: 8768-1157 ?PT Time Calculation (min) (ACUTE ONLY): 26 min ? ? ?Charges:   PT Evaluation ?$PT Eval Moderate Complexity: 1 Mod ?  ?  ?   ?

## 2022-03-31 NOTE — Progress Notes (Signed)
? ?NAME:  Tyler [name], MRN:  [id number], DOB:  1951/10/10, LOS: 21 ?ADMISSION DATE:  03/09/2022, CONSULTATION DATE:  03/31/22 ?REFERRING MD:  Dr. Marcello Moores, Surgery CHIEF COMPLAINT:  sepsis  ? ?History of Present Illness:  ?71 yo male former smoker had transanal hemorrhoidal dearterialization on 4/21.  Presented to [facility] ER on 4/25 with nausea, fever, and abdominal pain.  CT abd/pelvis showed rectal wall thickening.  Admit by surgery and started on antibiotics.  Had persistent pain and fever.  Repeat CT abdomen/pelvis showed possible perforation.  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  He had initial improvement, but then had recurrent symptoms.  He was taken to the OR on 5/06 and had diagnostic laparotomy, abdominal wash out, drain placement and diverting colostomy.  PCCM consulted to assist with medical management in ICU. ? ?Pertinent  Medical History  ?Allergies, Shingles, ED, GERD, Pityriasis rosea, Thrombocytopenia, Colon polyp, HTN, Asthma, Osteoarthritis ? ?Significant Hospital Events:   ?4/21 hemorrhoidal dearterialization ?4/25 admitted to surgery with fever and pain, thickening of rectal wall  ?5/01 Taken to OR for anal canal I&D and rectal wall necrosis debridement ?5/03 signs of worsening sepsis, transfer to progressive and PCCM consult ?5/04 HR better end organ fxn stable ?5/06 Has worsening infection on CT scan and patient taken back to OR for diverting colostomy with ostomy creation and abd wash out.  PCCM reconsulted for hypotension.  ?5/6-5/9 PCCM followed for possible evolving sepsis. Has had intermittent fevers however leukocytosis has resolved and clinically improving ?5/14 PCCM reconsulted for tachycardia and possible sepsis; IR and ID consulted ?5/16 Tm 102.6 F, MRI Lt femur negative for abscess but ultrasound positive for fluid collection >> aspirated by IR  ? ?Interim History / Subjective:  ?Feels some improvement.  Tm  100.1. ? ?Objective   ?Blood pressure 134/73, pulse 99, temperature 99 ?F (37.2 ?C), temperature source Oral, resp. rate (!) 29, height '5\' 9"'$  (1.753 m), weight 88.6 kg, SpO2 92 %. ?   ?FiO2 (%):  [0 %-21 %] 21 %  ? ?Intake/Output Summary (Last 24 hours) at 03/31/2022 0831 ?Last data filed at 03/31/2022 0700 ?Gross per 24 hour  ?Intake 1638.65 ml  ?Output 3035 ml  ?Net -1396.35 ml  ? ?Filed Weights  ? 03/18/22 0800 03/24/22 1208 03/26/22 0400  ?Weight: 82.2 kg 73 kg 88.6 kg  ? ? ?Physical Exam: ? ?General - alert ?Eyes - pupils reactive ?ENT - no sinus tenderness, no stridor ?Cardiac - regular rate/rhythm, no murmur ?Chest - equal breath sounds b/l, no wheezing or rales ?Abdomen - drains in place, ostomy in place ?Extremities - no cyanosis, clubbing, or edema ?Skin - no rashes ?Neuro - normal strength, moves extremities, follows commands ?Psych - normal mood and behavior ? ?Assessment & Plan:  ? ?Sepsis 2nd to anal abscess with E coli and Enterococcus faecalis in wound culture. ?Persistent fever likely from Lt thigh abscess. ?- Day 23 of Abx, changed to zosyn on 5/15 per ID ?- f/u abscess culture from 5/16 ?- d/c PICC line once he is transitioned off TPN ? ?Anemia from GI blood loss and critical illness. ?- f/u CBC ?- transfuse for Hb < 7  ?- continue ferrous sulfate ? ?Sinus tachycardia, hypervolemia.Marland Kitchen ?Hx of hypertension. ?- continue lopressor ?- had good response to diuresis on 5/16 >> will give additional lasix 40 mg IV x one on 5/17 ? ?Hx of allergic asthma. ?- prn albuterol ? ?  Anxiety. ?- scheduled ativan ? ?Best Practice (right click and "Reselect all SmartList Selections" daily)  ? ?Diet/type: Regular consistency (see orders) and TPN ?DVT prophylaxis: SCD  ?GI prophylaxis: PPI ?Lines: yes and it is still needed, double lumen Rt PICC 03/12/22 ?Foley:  Yes, and it is still needed ?Code Status:  full code ?Last date of multidisciplinary goals of care discussion [per primary ] ? ?Labs:  ? ? ?  Latest Ref Rng & Units  03/31/2022  ?  3:41 AM 03/30/2022  ?  5:27 AM 03/29/2022  ?  6:10 AM  ?CMP  ?Glucose 70 - 99 mg/dL 116   86   93    ?BUN 8 - 23 mg/dL '29   28   24    '$ ?Creatinine 0.61 - 1.24 mg/dL 1.15   0.96   0.86    ?Sodium 135 - 145 mmol/L 135   133   131    ?Potassium 3.5 - 5.1 mmol/L 3.8   4.3   4.1    ?Chloride 98 - 111 mmol/L 104   105   105    ?CO2 22 - 32 mmol/L '23   21   20    '$ ?Calcium 8.9 - 10.3 mg/dL 7.6   7.6   7.4    ?Total Protein 6.5 - 8.1 g/dL   5.6    ?Total Bilirubin 0.3 - 1.2 mg/dL   0.5    ?Alkaline Phos 38 - 126 U/L   215    ?AST 15 - 41 U/L   24    ?ALT 0 - 44 U/L   16    ? ? ? ?  Latest Ref Rng & Units 03/31/2022  ?  3:41 AM 03/30/2022  ?  5:27 AM 03/29/2022  ?  4:30 AM  ?CBC  ?WBC 4.0 - 10.5 K/uL 11.0   10.4   7.4    ?Hemoglobin 13.0 - 17.0 g/dL 8.1   8.7   7.8    ?Hematocrit 39.0 - 52.0 % 24.7   25.6   23.8    ?Platelets 150 - 400 K/uL 470   429   347    ? ? ?ABG ?   ?Component Value Date/Time  ? PHART 7.5 (H) 03/28/2022 0843  ? PCO2ART 25 (L) 03/28/2022 0843  ? PO2ART 61 (L) 03/28/2022 0843  ? HCO3 19.5 (L) 03/28/2022 0843  ? TCO2 26 03/05/2022 1110  ? ACIDBASEDEF 2.1 (H) 03/28/2022 0843  ? O2SAT 95.8 03/28/2022 0843  ? ? ?CBG (last 3)  ?Recent Labs  ?  03/29/22 ?0727  ?GLUCAP 123*  ? ? ?Signature:  ?Chesley Mires, MD ?[place] ?Pager - [phone] ?03/31/2022, 8:31 AM ? ? ?

## 2022-03-31 NOTE — Progress Notes (Signed)
PHARMACY - TOTAL PARENTERAL NUTRITION CONSULT NOTE  ? ?Indication: rectal perforation, prolonged NPO ? ?Patient Measurements: ?Height: 5' 9" (175.3 cm) ?Weight: 88.6 kg (195 lb 5.2 oz) ?IBW/kg (Calculated) : 70.7 ?TPN AdjBW (KG): 82.2 ?Body mass index is 28.84 kg/m?. ? ?Assessment: 71 yo male with history of rectal bleeding and prolapsed internal hemorrhoids who recently underwent hemorrhoid surgery w/ hemorrhoidal dearterialization on 03/05/22.  He presented to [facility] on 4/25 with increasing abdominal pain, nausea, and fever.  CT abdomen/pelvis with thickening of rectal wall; gas and fluid within the rectal wall and in presacral space concerning for rectal perforation.  Pharmacy has been consulted to manage TPN as patient unable to tolerate PO. ? ?Glucose / Insulin: no history of DM, CBG/SSI d/c 5/12.  Glucose on chem < 150 ?Electrolytes:  Na 135, CO2 23, others WNL ?Renal: stable, Scr slight bump 1.15 ?Hepatic: AST/ALT, Tbili WNL.  Albumin <1.5, TG 111, alk phos rising to 215 (5/15) ?Intake / Output: I/O net -4.8 L for admission ?- Output overnight:  150 ml stool, 160 ml drain, 1425 mL urine ?- Intake 5/16: Ensure Plus x2, Prosource protein x3 ?GI Imaging: ?- 4/25 CTAP: rectal wall thickening w/ gas tracking into the anterior rectal wall, presacral fluid as well as stranding and gas in the mesorectal fat; consistent w/ rectal perforation ?- 4/30 CT scan: extraluminal air tracking into mesenteric space but no free peritoneal fluid   ?- 5/5 CT: Progressive Necrotizing Infection, ileus.  ?5/12 CTAP: 5.3x3.5x7.2 cm gas collection in the LUQ near the kidney.   ?5/14 CTAP: Persistent but slightly decreased gas/fluid collection anterior to the left kidney.  Progression of IM fluid collections within left thigh.  ?GI Surgeries / Procedures:  ?- 4/21 (prior to admit): Transanal hemorrhoidal dearterialization procedure ?- 5/1 Proctoscopy, debridement right gluteal abscess with anal canal necrosis  ?- 5/6 ex lap, ostomy  creation and drain placement ? ?Central access: PICC placed 4/28 ?TPN start date: 03/12/22 ? ?Nutritional Goals: ?Goal TPN rate is 100 mL/hr (provides 125 g of protein and 2395 kcals per day) ? ?RD Assessment:   ?Estimated Needs ?Total Energy Estimated Needs: 2400-2700 kcal ?Total Protein Estimated Needs: 125-140 grams ?Total Fluid Estimated Needs: >/= 2.5 L/day ? ? ?Current Nutrition:  ?TPN - reduced to 1/2 rate on 5/15 (62g protein, 1197 kcal) ?Mechanical soft diet ?Supplements per RD:  Ensure TID and Prosource QID (120g protein, 1450 kcals) ? ?Plan:  ?Continue TPN at 89m/hr - oral intake, nutritional supplements inconsistent due to intermittent patient refusal. Nursing continuing to encourage PO intake. ?Electrolytes in TPN:  ?Na 60 mEq/L  ?K  671m/L ?Ca 9m3mL ?Mg 8 mEq/L  ?Phos 20 mmol/L  ?Cl:Ac 1:1 ?Continue PO MVI & PO pepcid  (taking PO meds) ?Monitor TPN labs on Mon/Thurs and PRN ? ?AbbTawnya CrookharmD, BCPS ?Clinical Pharmacist ?03/31/2022 11:18 AM ? ? ? ?

## 2022-03-31 NOTE — Evaluation (Signed)
Occupational Therapy Evaluation ?Patient Details ?Name: Tyler [name] ?MRN: [id number] ?DOB: 10/31/1951 ?Today's Date: 03/31/2022 ? ? ?History of Present Illness 71 yo male former smoker had transanal hemorrhoidal dearterialization on 4/21.  Presented to [facility] ER on 4/25 with nausea, fever, and abdominal pain.  Repeat CT abdomen/pelvis showed possible perforation.  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 He was taken to the OR on 5/06 and had diagnostic laparotomy, abdominal wash out, drain placement and diverting colostomy.  5/14 PCCM reconsulted for tachycardia and possible sepsis.  MRI Lt femur negative for abscess but ultrasound positive for fluid collection aspirated by IR  ? ?Clinical Impression ?  ?Tyler [name] is a 71 year old man who presents with generalized weakness decreased activity tolerance, impaired balance and pain resulting in a sudden decline in functional abilities. 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 On evaluation he requires +2 assistance for supine to sit, standing and transfer to recliner. He needs increased assistance for UB ADLs and total assist for toileting and LB dressing. He is limited by abdomina pain, drains and colostomy. Patient will benefit from skilled OT services while in hospital to improve deficits and learn compensatory strategies as needed in order to return to PLOF.  Recommend aggressive short term rehab at discharge. ?   ? ?Recommendations for follow up therapy are one component of a multi-disciplinary discharge planning process, led by the attending physician.  Recommendations may be updated based on patient status, additional functional criteria and insurance authorization.  ? ?Follow Up Recommendations ? Acute inpatient rehab (3hours/day)  ?  ?Assistance Recommended at Discharge Frequent or constant Supervision/Assistance  ?Patient can return home with the  following A lot of help with walking and/or transfers;A lot of help with bathing/dressing/bathroom;Assistance with cooking/housework;Direct supervision/assist for financial management;Direct supervision/assist for medications management;Help with stairs or ramp for entrance ? ?  ?Functional Status Assessment ? Patient has had a recent decline in their functional status and demonstrates the ability to make significant improvements in function in a reasonable and predictable amount of time.  ?Equipment Recommendations ? BSC/3in1;Tub/shower seat  ?  ?Recommendations for Other Services   ? ? ?  ?Precautions / Restrictions Precautions ?Precautions: Fall ?Precaution Comments: 3 drains, colostomy ?Restrictions ?Weight Bearing Restrictions: No  ? ?  ? ?Mobility Bed Mobility ?Overal bed mobility: Needs Assistance ?Bed Mobility: Supine to Sit ?  ?  ?Supine to sit: Mod assist, HOB elevated, +2 for physical assistance ?  ?  ?General bed mobility comments: total of mod assist with multimodal cues to transfer to edge of bed, +2 assistance ?  ? ?Transfers ?Overall transfer level: Needs assistance ?Equipment used: Rolling walker (2 wheels) ?Transfers: Sit to/from Stand, Bed to chair/wheelchair/BSC ?Sit to Stand: Min assist, +2 physical assistance, From elevated surface ?  ?  ?Step pivot transfers: Min assist, +2 physical assistance ?  ?  ?General transfer comment: Min x 2 to power up and min x 2 to take steps to recliner with RW. Verbal cues and encouragement ?  ? ?  ?Balance Overall balance assessment: Needs assistance ?Sitting-balance support: No upper extremity supported ?Sitting balance-Leahy Scale: Fair ?Sitting balance - Comments: propping on UEs but more so due to pain ?  ?Standing balance support: During functional activity ?Standing balance-Leahy Scale: Poor ?Standing balance comment: reliant on external assistance ?  ?  ?  ?  ?  ?  ?  ?  ?  ?  ?  ?   ? ?  ADL either performed or assessed with clinical judgement  ? ?ADL  Overall ADL's : Needs assistance/impaired ?Eating/Feeding: Set up;Sitting ?  ?Grooming: Set up;Sitting ?  ?Upper Body Bathing: Sitting;Moderate assistance ?  ?Lower Body Bathing: Sitting/lateral leans;Maximal assistance ?  ?Upper Body Dressing : Moderate assistance;Sitting ?  ?Lower Body Dressing: Total assistance;Sitting/lateral leans ?  ?  ?Toilet Transfer Details (indicate cue type and reason): deferred - colostomy, catheter ?Toileting- Clothing Manipulation and Hygiene: Total assistance ?  ?  ?  ?Functional mobility during ADLs: Minimal assistance;+2 for physical assistance;Rolling walker (2 wheels) ?   ? ? ? ?Vision Patient Visual Report: No change from baseline ?   ?   ?Perception   ?  ?Praxis   ?  ? ?Pertinent Vitals/Pain Pain Assessment ?Pain Assessment: Faces ?Pain Location: abdomen ?Pain Descriptors / Indicators: Grimacing, Guarding ?Pain Intervention(s): PCA encouraged, Monitored during session  ? ? ? ?Hand Dominance Right ?  ?Extremity/Trunk Assessment Upper Extremity Assessment ?Upper Extremity Assessment: RUE deficits/detail;LUE deficits/detail ?RUE Deficits / Details: WFL ROM, 5/5 strength ?RUE Sensation: WNL ?RUE Coordination: WNL ?LUE Deficits / Details: WFL ROM, 5/5 strength ?LUE Sensation: WNL ?LUE Coordination: WNL ?  ?Lower Extremity Assessment ?Lower Extremity Assessment: Defer to PT evaluation ?  ?Cervical / Trunk Assessment ?Cervical / Trunk Assessment: Normal ?  ?Communication Communication ?Communication: No difficulties ?  ?Cognition Arousal/Alertness: Awake/alert ?Behavior During Therapy: [facility] for tasks assessed/performed ?Overall Cognitive Status: Within Functional Limits for tasks assessed ?  ?  ?  ?  ?  ?  ?  ?  ?  ?  ?  ?  ?  ?  ?  ?  ?  ?  ?  ?General Comments    ? ?  ?Exercises   ?  ?Shoulder Instructions    ? ? ?Home Living Family/patient expects to be discharged to:: Inpatient rehab ?Living Arrangements: Spouse/significant other ?Available Help at Discharge: Family ?Type of Home:  House ?Home Access: Stairs to enter ?Entrance Stairs-Number of Steps: 3 ?  ?  ?  ?  ?  ?  ?  ?  ?  ?Home Equipment: None ?  ?  ?  ? ?  ?Prior Functioning/Environment Prior Level of Function : Independent/Modified Independent ?  ?  ?  ?  ?  ?  ?  ?  ?  ? ?  ?  ?OT Problem List: Decreased strength;Decreased activity tolerance;Impaired balance (sitting and/or standing);Decreased knowledge of use of DME or AE;Decreased knowledge of precautions;Cardiopulmonary status limiting activity;Pain;Increased edema ?  ?   ?OT Treatment/Interventions: Self-care/ADL training;Therapeutic exercise;DME and/or AE instruction;Therapeutic activities;Balance training;Patient/family education  ?  ?OT Goals(Current goals can be found in the care plan section) Acute Rehab OT Goals ?Patient Stated Goal: go back to bed ?OT Goal Formulation: With patient ?Time For Goal Achievement: 04/14/22 ?Potential to Achieve Goals: Good  ?OT Frequency: Min 2X/week ?  ? ?Co-evaluation   ?  ?  ?  ?  ? ?  ?AM-PAC OT "6 Clicks" Daily Activity     ?Outcome Measure Help from another person eating meals?: A Little ?Help from another person taking care of personal grooming?: A Little ?Help from another person toileting, which includes using toliet, bedpan, or urinal?: Total ?Help from another person bathing (including washing, rinsing, drying)?: A Lot ?Help from another person to put on and taking off regular upper body clothing?: A Lot ?Help from another person to put on and taking off regular lower body clothing?: Total ?6 Click Score: 12 ?  ?End  of Session Equipment Utilized During Treatment: Rolling walker (2 wheels) ?Nurse Communication: Mobility status ? ?Activity Tolerance: Patient tolerated treatment well ?Patient left: in chair;with call bell/phone within reach;with nursing/sitter in room ? ?OT Visit Diagnosis: Muscle weakness (generalized) (M62.81);Pain  ?              ?Time: 4604-7998 ?OT Time Calculation (min): 26 min ?Charges:  OT General Charges ?$OT  Visit: 1 Visit ?OT Evaluation ?$OT Eval Moderate Complexity: 1 Mod ? ?Larsen Dungan, OTR/L ?Acute Care Rehab Services  ?Office [phone] ?Pager: [phone]  ? ?Sherwood Castilla L Kohner Orlick ?03/31/2022, 1:39 PM ?

## 2022-03-31 NOTE — Progress Notes (Signed)
11 Days Post-Op ex lap, ostomy creation and drain placement ?Subjective: ?Poor po intake yesterday.  Had aspiration.  Foley replaced due to urinary retention ?Objective: ?Vital signs in last 24 hours: ?Temp:  [97.8 ?F (36.6 ?C)-100.1 ?F (37.8 ?C)] 98.1 ?F (36.7 ?C) (05/17 0800) ?Pulse Rate:  [92-129] 99 (05/17 0700) ?Resp:  [16-42] 25 (05/17 0800) ?BP: (109-156)/(68-93) 134/73 (05/17 0700) ?SpO2:  [87 %-100 %] 95 % (05/17 0800) ?FiO2 (%):  [0 %-21 %] 21 % (05/17 0348) ? ? ?Intake/Output from previous day: ?05/16 0701 - 05/17 0700 ?In: 1747.4 [P.O.:120; I.V.:1458.6; IV Piggyback:168.8] ?Out: 0092 [ZRAQT:6226; Drains:185; JFHLK:562] ?Intake/Output this shift: ?Total I/O ?In: 72.5 [I.V.:60; IV Piggyback:12.5] ?Out: 725 [Urine:625; Drains:100] ? ? ?General appearance: alert and cooperative ?GI: soft, non-distended, less TTP ?Pelvis JP with purulent fluid ?Sub Q and RP drains with cloudy, purulent fluid ?Wet to dry dressing in place ?Colostomy viable with stool and air ? ?Lab Results:  ?Recent Labs  ?  03/30/22 ?5638 03/31/22 ?0341  ?WBC 10.4 11.0*  ?HGB 8.7* 8.1*  ?HCT 25.6* 24.7*  ?PLT 429* 470*  ? ? ?BMET ?Recent Labs  ?  03/30/22 ?9373 03/31/22 ?0341  ?NA 133* 135  ?K 4.3 3.8  ?CL 105 104  ?CO2 21* 23  ?GLUCOSE 86 116*  ?BUN 28* 29*  ?CREATININE 0.96 1.15  ?CALCIUM 7.6* 7.6*  ? ? ?PT/INR ?No results for input(s): LABPROT, INR in the last 72 hours. ? ?ABG ?No results for input(s): PHART, HCO3 in the last 72 hours. ? ?Invalid input(s): PCO2, PO2 ? ? ?MEDS, Scheduled ? (feeding supplement) PROSource Plus  30 mL Oral TID PC & HS  ? acetaminophen  1,000 mg Oral Q6H  ? chlorhexidine  15 mL Mouth Rinse BID  ? Chlorhexidine Gluconate Cloth  6 each Topical Q0600  ? famotidine  20 mg Oral BID  ? feeding supplement  237 mL Oral TID BM  ? ferrous sulfate  325 mg Oral TID  ? HYDROmorphone   Intravenous Q4H  ? lip balm  1 application. Topical BID  ? LORazepam  0.25 mg Intravenous Q8H  ? mouth rinse  15 mL Mouth Rinse q12n4p  ?  metoprolol tartrate  5 mg Intravenous Q6H  ? multivitamin with minerals  1 tablet Oral Daily  ? sodium chloride flush  10-40 mL Intracatheter Q12H  ? ? ?Studies/Results: ?MR FEMUR LEFT WO CONTRAST ? ?Result Date: 03/29/2022 ?CLINICAL DATA:  History of hemorrhoid surgery and now has left thigh pain from hip to the knee. EXAM: MR OF THE LEFT FEMUR WITHOUT CONTRAST TECHNIQUE: Multiplanar, multisequence MR imaging of the left femur was performed. No intravenous contrast was administered. COMPARISON:  None Available. FINDINGS: Incomplete examination, patient was unable to tolerate and complete examination. Only axial T1 and coronal T1 and STIR images were obtained. Bones/Joint/Cartilage No appreciable fracture of the partially imaged left femur. No appreciable joint effusion. Degenerative changes of the left hip joint without evidence of significant joint effusion. Muscles and Tendons Muscles are normal in bulk and signal. No intramuscular hematoma or significant atrophy. Soft tissues Skin and subcutaneous soft tissues are within normal limits. IMPRESSION: Limited examination of the left femur. Within the above-mentioned limitations no acute osseous or soft tissue abnormality. Electronically Signed   By: Keane Police D.O.   On: 03/29/2022 21:59  ? ?[place] LT LOWER EXTREM LTD SOFT TISSUE NON VASCULAR ? ?Result Date: 03/30/2022 ?CLINICAL DATA:  71 year old male with history of recent diverting colostomy status post hemorrhoid embolization procedure with development  of left lower extremity swelling and possible fluid collections. EXAM: ULTRASOUND LEFT LOWER EXTREMITY LIMITED TECHNIQUE: Ultrasound examination of the lower extremity soft tissues was performed in the area of clinical concern. COMPARISON:  03/28/2022, 03/29/2022 FINDINGS: Within the mid to distal posterolateral thigh musculature are multifocal, irregularly-shaped heterogeneously hypoechoic structures without significant internal vascularity. Given morphologic  irregularity, the fluid collections are difficult to measure in entirety. IMPRESSION: Multifocal, irregularly-shaped mildly complex fluid collections within the mid to distal left posterolateral thigh musculature. Favored to represent abscesses given findings on comparison studies. Ruthann Cancer, MD [facility] Radiology Electronically Signed   By: Ruthann Cancer M.D.   On: 03/30/2022 10:06  ? ?[place] FINE NEEDLE ASP 1ST LESION ? ?Result Date: 03/31/2022 ?INDICATION: 71 year old male with history of recent diverting colostomy status post hemorrhoid embolization procedure with development of left lower extremity intramuscular fluid collections concerning for abscess. EXAM: Ultrasound-guided aspiration of left lower extremity fluid collection MEDICATIONS: The patient is currently admitted to the hospital and receiving intravenous antibiotics. The antibiotics were administered within an appropriate time frame prior to the initiation of the procedure. ANESTHESIA/SEDATION: None. COMPLICATIONS: None immediate. PROCEDURE: Informed written consent was obtained from the patient after a thorough discussion of the procedural risks, benefits and alternatives. All questions were addressed. Maximal Sterile Barrier Technique was utilized including caps, mask, sterile gowns, sterile gloves, sterile drape, hand hygiene and skin antiseptic. A timeout was performed prior to the initiation of the procedure. Preprocedure ultrasound evaluation of the left lower extremity was performed. Within the intramuscular aspect of the posterior left thigh in the mid to distal portions are phlegmonous changes with a few interspersed simple appearing fluid collections. The procedure was planned. The left lower extremity is prepped and draped in standard fashion. Subdermal Local anesthesia was administered at the planned needle entry site with 1% lidocaine. Under direct ultrasound visualization, a 5 [place], 7  cm Yueh catheter was directed into the fluid collection. Aspiration was performed which yielded approximately 10 mL of purulent fluid. Samples were sent for culture. The needle was removed. Postprocedure ultrasound of it patient demonstrated no evidence of hematoma or other complicating features. A bandage was applied. The patient tolerated the procedure well was transferred back to the ICU in stable condition. IMPRESSION: Technically successful ultrasound-guided aspiration of left posterior thigh intramuscular abscess yielding approximately 10 mL of purulent fluid. Ruthann Cancer, MD [facility] Radiology Electronically Signed   By: Ruthann Cancer M.D.   On: 03/31/2022 08:07   ? ?Assessment: ?s/p  ?Patient Active Problem List  ? Diagnosis Date Noted  ? Sepsis ([place]) 03/18/2022  ? Ileus following gastrointestinal surgery ([place]) 03/18/2022  ? Normal anion gap metabolic acidosis 63/11/6008  ? Anemia 03/18/2022  ? Post-op pain 03/18/2022  ? Acute urinary retention 03/12/2022  ? 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 Mild persistent asthma 03/12/2022  ? Abdominal pain 03/09/2022  ? Rectal perforation ([place]) 03/09/2022  ? Pruritus ani 04/20/2018  ? Arthritis of both knees 01/13/2018  ? Hx of adenomatous polyp of colon 04/07/2017  ? Perianal dermatitis 01/14/2016  ? 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/12/2013  ? History of tobacco use 11/12/2013  ? Low testosterone 09/19/2013  ? Sinusitis 12/21/2012  ? 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 Eczema 10/30/2012  ? Prolapsed internal hemorrhoids, grade 3 10/30/2012  ? Hypertension 04/13/2011  ? [place] 05/14/2010  ? NEOPLASM, SKIN, UNCERTAIN BEHAVIOR 93/23/5573  ? ALLERGIC RHINITIS 08/16/2008  ? Rash and other nonspecific  skin eruption 07/02/2008  ? ABNORMAL COAGULATION PROFILE 05/29/2008  ? Gastroesophageal reflux disease 06/28/2007  ? PITYRIASIS [place] 06/28/2007  ? OSTEOARTHROSIS, LOCAL NOS, LOWER LEG 06/28/2007   ? Asthma, chronic 05/11/2007  ? ? ?Pelvic sepsis.  Currently drained and debrided.   ? 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 ?CT 5/5

## 2022-03-31 NOTE — Progress Notes (Signed)
Pt complaining of urinary urgency,voiding less than 50cc each encounter and still having the urge to urinate. Unable to scan bladder due to surgical interventions and dressings. Will replace foley for suspected urinary retention and continue to monitor patient. ?

## 2022-03-31 NOTE — Progress Notes (Signed)
? ?  Inpatient Rehab Admissions Coordinator : ? ?Per therapy recommendations, patient was screened for CIR candidacy by Moataz Tavis RN MSN.  At this time patient appears to be a potential candidate for CIR. I will place a rehab consult per protocol for full assessment. Please call me with any questions. ? ?Sueko Dimichele RN MSN ?Admissions Coordinator ?[phone] ?  ?

## 2022-04-01 DIAGNOSIS — L0291 Cutaneous abscess, unspecified: Secondary | ICD-10-CM | POA: Diagnosis not present

## 2022-04-01 DIAGNOSIS — K631 Perforation of intestine (nontraumatic): Secondary | ICD-10-CM | POA: Diagnosis not present

## 2022-04-01 DIAGNOSIS — E43 Unspecified severe protein-calorie malnutrition: Secondary | ICD-10-CM | POA: Diagnosis not present

## 2022-04-01 LAB — CBC
HCT: 22.9 % — ABNORMAL LOW (ref 39.0–52.0)
Hemoglobin: 7.8 g/dL — ABNORMAL LOW (ref 13.0–17.0)
MCH: 32.4 pg (ref 26.0–34.0)
MCHC: 34.1 g/dL (ref 30.0–36.0)
MCV: 95 fL (ref 80.0–100.0)
Platelets: 456 10*3/uL — ABNORMAL HIGH (ref 150–400)
RBC: 2.41 MIL/uL — ABNORMAL LOW (ref 4.22–5.81)
RDW: 14.1 % (ref 11.5–15.5)
WBC: 8.4 10*3/uL (ref 4.0–10.5)
nRBC: 0 % (ref 0.0–0.2)

## 2022-04-01 LAB — COMPREHENSIVE METABOLIC PANEL
ALT: 30 U/L (ref 0–44)
AST: 33 U/L (ref 15–41)
Albumin: 1.6 g/dL — ABNORMAL LOW (ref 3.5–5.0)
Alkaline Phosphatase: 338 U/L — ABNORMAL HIGH (ref 38–126)
Anion gap: 8 (ref 5–15)
BUN: 27 mg/dL — ABNORMAL HIGH (ref 8–23)
CO2: 23 mmol/L (ref 22–32)
Calcium: 7.5 mg/dL — ABNORMAL LOW (ref 8.9–10.3)
Chloride: 105 mmol/L (ref 98–111)
Creatinine, Ser: 1.06 mg/dL (ref 0.61–1.24)
GFR, Estimated: >60 mL/min (ref >60)
Glucose, Bld: 108 mg/dL — ABNORMAL HIGH (ref 70–99)
Potassium: 3.7 mmol/L (ref 3.5–5.1)
Sodium: 136 mmol/L (ref 135–145)
Total Bilirubin: 0.7 mg/dL (ref 0.3–1.2)
Total Protein: 5.8 g/dL — ABNORMAL LOW (ref 6.5–8.1)

## 2022-04-01 LAB — PHOSPHORUS: Phosphorus: 4.1 mg/dL (ref 2.5–4.6)

## 2022-04-01 LAB — TRIGLYCERIDES: Triglycerides: 173 mg/dL — ABNORMAL HIGH (ref <150)

## 2022-04-01 LAB — MAGNESIUM: Magnesium: 1.9 mg/dL (ref 1.7–2.4)

## 2022-04-01 MED ORDER — TRAZODONE HCL 50 MG PO TABS
50.0000 mg | ORAL_TABLET | Freq: Every evening | ORAL | Status: DC | PRN
  Administered 2022-04-01 – 2022-04-07 (×4): 50 mg via ORAL
  Filled 2022-04-01 (×5): qty 1

## 2022-04-01 MED ORDER — LORAZEPAM 0.5 MG PO TABS
0.5000 mg | ORAL_TABLET | Freq: Two times a day (BID) | ORAL | Status: DC
  Administered 2022-04-01 (×2): 0.5 mg via ORAL
  Filled 2022-04-01 (×2): qty 1

## 2022-04-01 MED ORDER — METOPROLOL TARTRATE 50 MG PO TABS
50.0000 mg | ORAL_TABLET | Freq: Two times a day (BID) | ORAL | Status: DC
  Administered 2022-04-01 – 2022-04-03 (×5): 50 mg via ORAL
  Filled 2022-04-01: qty 2
  Filled 2022-04-01 (×3): qty 1
  Filled 2022-04-01 (×2): qty 2

## 2022-04-01 NOTE — Progress Notes (Signed)
12 Days Post-Op ex lap, ostomy creation and drain placement Subjective: Poor po intake yesterday.  Got up with PT.    Objective: Vital signs in last 24 hours: Temp:  [97.9 F (36.6 C)-99.9 F (37.7 C)] 97.9 F (36.6 C) (05/18 0400) Pulse Rate:  [31-122] 95 (05/18 0700) Resp:  [15-34] 31 (05/18 0700) BP: (118-148)/(66-95) 143/73 (05/18 0700) SpO2:  [38 %-100 %] 91 % (05/18 0700) FiO2 (%):  [21 %] 21 % (05/18 0421)   Intake/Output from previous day: 05/17 0701 - 05/18 0700 In: 716.8 [I.V.:650.8; IV Piggyback:66] Out: 8546 [Urine:4700; Drains:240; EVOJJ:009] Intake/Output this shift: No intake/output data recorded.   General appearance: alert and cooperative GI: soft, non-distended, less TTP Pelvis JP with purulent fluid Sub Q and RP drains with cloudy, purulent fluid Wet to dry dressing in place Colostomy viable with stool and air  Lab Results:  Recent Labs    03/31/22 0341 04/01/22 0343  WBC 11.0* 8.4  HGB 8.1* 7.8*  HCT 24.7* 22.9*  PLT 470* 456*    BMET Recent Labs    03/31/22 0341 04/01/22 0343  NA 135 136  K 3.8 3.7  CL 104 105  CO2 23 23  GLUCOSE 116* 108*  BUN 29* 27*  CREATININE 1.15 1.06  CALCIUM 7.6* 7.5*    PT/INR No results for input(s): LABPROT, INR in the last 72 hours.  ABG No results for input(s): PHART, HCO3 in the last 72 hours.  Invalid input(s): PCO2, PO2   MEDS, Scheduled  (feeding supplement) PROSource Plus  30 mL Oral TID PC & HS   acetaminophen  1,000 mg Oral Q6H   chlorhexidine  15 mL Mouth Rinse BID   Chlorhexidine Gluconate Cloth  6 each Topical Q0600   famotidine  20 mg Oral BID   feeding supplement  237 mL Oral TID BM   ferrous sulfate  325 mg Oral TID   HYDROmorphone   Intravenous Q4H   lip balm  1 application. Topical BID   LORazepam  0.25 mg Intravenous Q8H   mouth rinse  15 mL Mouth Rinse q12n4p   metoprolol tartrate  5 mg Intravenous Q6H   multivitamin with minerals  1 tablet Oral Daily   sodium chloride  flush  10-40 mL Intracatheter Q12H    Studies/Results: [place] LT LOWER EXTREM LTD SOFT TISSUE NON VASCULAR  Result Date: 03/30/2022 CLINICAL DATA:  71 year old male with history of recent diverting colostomy status post hemorrhoid embolization procedure with development of left lower extremity swelling and possible fluid collections. EXAM: ULTRASOUND LEFT LOWER EXTREMITY LIMITED TECHNIQUE: Ultrasound examination of the lower extremity soft tissues was performed in the area of clinical concern. COMPARISON:  03/28/2022, 03/29/2022 FINDINGS: Within the mid to distal posterolateral thigh musculature are multifocal, irregularly-shaped heterogeneously hypoechoic structures without significant internal vascularity. Given morphologic irregularity, the fluid collections are difficult to measure in entirety. IMPRESSION: Multifocal, irregularly-shaped mildly complex fluid collections within the mid to distal left posterolateral thigh musculature. Favored to represent abscesses given findings on comparison studies. Ruthann Cancer, MD [facility] Radiology Electronically Signed   By: Ruthann Cancer M.D.   On: 03/30/2022 10:06   [place] FINE NEEDLE ASP 1ST LESION  Result Date: 03/31/2022 INDICATION: 71 year old male with history of recent diverting colostomy status post hemorrhoid embolization procedure with development of left lower extremity intramuscular fluid collections concerning for abscess. EXAM: Ultrasound-guided aspiration of left lower extremity fluid collection MEDICATIONS: The patient is currently admitted to the hospital and receiving intravenous antibiotics.  The antibiotics were administered within an appropriate time frame prior to the initiation of the procedure. ANESTHESIA/SEDATION: None. COMPLICATIONS: None immediate. PROCEDURE: Informed written consent was obtained from the patient after a thorough discussion of the procedural risks, benefits and alternatives. All  questions were addressed. Maximal Sterile Barrier Technique was utilized including caps, mask, sterile gowns, sterile gloves, sterile drape, hand hygiene and skin antiseptic. A timeout was performed prior to the initiation of the procedure. Preprocedure ultrasound evaluation of the left lower extremity was performed. Within the intramuscular aspect of the posterior left thigh in the mid to distal portions are phlegmonous changes with a few interspersed simple appearing fluid collections. The procedure was planned. The left lower extremity is prepped and draped in standard fashion. Subdermal Local anesthesia was administered at the planned needle entry site with 1% lidocaine. Under direct ultrasound visualization, a 5 [place], 7 cm Yueh catheter was directed into the fluid collection. Aspiration was performed which yielded approximately 10 mL of purulent fluid. Samples were sent for culture. The needle was removed. Postprocedure ultrasound of it patient demonstrated no evidence of hematoma or other complicating features. A bandage was applied. The patient tolerated the procedure well was transferred back to the ICU in stable condition. IMPRESSION: Technically successful ultrasound-guided aspiration of left posterior thigh intramuscular abscess yielding approximately 10 mL of purulent fluid. Ruthann Cancer, MD [facility] Radiology Electronically Signed   By: Ruthann Cancer M.D.   On: 03/31/2022 08:07    Assessment: s/p  Patient Active Problem List   Diagnosis Date Noted   Sepsis ([place]) 03/18/2022   Ileus following gastrointestinal surgery ([place]) 03/18/2022   Normal anion gap metabolic acidosis 93/71/6967   Anemia 03/18/2022   Post-op pain 03/18/2022   Acute urinary retention 03/12/2022   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   Mild persistent asthma 03/12/2022   Abdominal pain 03/09/2022   Rectal perforation (HCC) 03/09/2022   Pruritus ani  04/20/2018   Arthritis of both knees 01/13/2018   Hx of adenomatous polyp of colon 04/07/2017   Perianal dermatitis 01/14/2016   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/12/2013   History of tobacco use 11/12/2013   Low testosterone 09/19/2013   Sinusitis 12/21/2012   Herpes labialis 10/30/2012   Eczema 10/30/2012   Prolapsed internal hemorrhoids, grade 3 10/30/2012   Hypertension 04/13/2011   FASCIITIS, PLANTAR 05/14/2010   NEOPLASM, SKIN, UNCERTAIN BEHAVIOR 89/38/1017   ALLERGIC RHINITIS 08/16/2008   Rash and other nonspecific skin eruption 07/02/2008   ABNORMAL COAGULATION PROFILE 05/29/2008   Gastroesophageal reflux disease 06/28/2007   PITYRIASIS ROSEA 06/28/2007   OSTEOARTHROSIS, LOCAL NOS, LOWER LEG 06/28/2007   Asthma, chronic 05/11/2007    Pelvic sepsis.  Currently drained and debrided.    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    OR findings 5/6: significant RP infection, drained via L pericolic gutter, presacral infection, drained, anterior abd wall infection, drained.  CT 5/14: L RP fluid collection slightly small, L thigh fluid collection stable  IR aspiration: 5/16, cultures pending Plan: Nutrition: Advance protein supplements.  TPN off Hopefully can remove PICC once TPN off ID: Cont IV antibiotics per ID.  Recheck cbc in AM.   Schedule metoprolol for tachycardia Cont scheduled tylenol  Anxiety: ativan scheduled and PRN Cont SCD's dressing changes q shift Cont foley d/t urinary retention     LOS: 22 days  Rosario Adie, MD [facility], [place] Pager: [phone]   04/01/2022 7:50 AM

## 2022-04-01 NOTE — Consult Note (Addendum)
[place] Nurse ostomy consult note Stoma type/location: LUQ colostomy Stomal assessment/size: 1 and 3/8 inches, round, red, moist, lumen pointing toward 9 o'clock . Current pouching system is oriented to the side; today's placement will orient pouching system vertically and toward the patient's groin for ease in emptying. Peristomal assessment: depression/gulley at 9 o'clock Treatment options for stomal/peristomal skin: skin barrier ring, convex 1-piece pouch Output: brown liquid effluent Ostomy pouching: 1pc.convex pouching system Kellie Simmering # [phone]) with skin barrier ring Kellie Simmering # [phone]). May need to add an additional 1/2 ring at 9 o'clock and consider belt Kellie Simmering # [phone]) for additional  security at the medial and lateral edge (3 and 9 o'clock). Education provided:  Patient is education-adverse this visit, bedside RN and student RN report that he got OOB for the first time since surgery yesterday (03/31/22). Patient reports that it did not go well.  I will order him a pressure redistribution chair cushion for use the chair today. Patient does not look at stoma today, but listens to education. My teaching is to both patient and Advertising copywriter. Explained role of ostomy nurse and creation of stoma  Explained stoma characteristics (budded, flush, color, texture, care) Demonstrated pouch change (cutting new skin barrier, measuring stoma, cleaning peristomal skin and stoma, use of barrier ring) Education on emptying when 1/3 to 1/2 full and how to empty  Answered student RN question regarding ostomy supplies and coverage and I relay that Medicare covers 80% of ostomy pouching supplies and that many supplemental plans cover the other 20%. Patient is relieved to hear that and thanks me for this information.  I reassure him that  continued education will be provided by me and by my associates in the days ahead as he leaves ICU and progresses to the floor.   Enrolled patient in [place] program: No    WOC nursing team will follow, and will remain available to this patient, the nursing and medical teams.   Thanks, Maudie Flakes, MSN, RN, [place], Arther Abbott  Pager# [phone]

## 2022-04-01 NOTE — TOC Progression Note (Signed)
Transition of Care [facility]) - Progression Note    Patient Details  Name: Tyler [name] MRN: [id number] Date of Birth: Jun 25, 1951  Transition of [facility] CM/SW Contact  Leeroy Cha, RN Phone Number: 04/01/2022, 7:21 AM  Clinical Narrative:   CIR Physical therapy suggesting CIR will follow for toc needs.     Barriers to Discharge: Continued Medical Work up  Expected Discharge Plan and Services                                                 Social Determinants of Health (SDOH) Interventions    Readmission Risk Interventions     View : No data to display.

## 2022-04-01 NOTE — Progress Notes (Signed)
[place] for Infectious Disease  Date of Admission:  03/09/2022           Reason for visit: Follow up on intra-abdominal infection   Current antibiotics: Zosyn    ASSESSMENT:    71 y.o. male admitted with:  Intra-abdominal infection: Patient admitted with sepsis following initial transanal hemorrhoidal dearterialization 03/05/2022.  Presented this admission on 4/25 with abdominal pain and fever and taken to the OR 5/1 with findings of necrosis of the anoderm posteriorly on EUA and large right gluteal abscess which was opened and drained.  Repeat CT 5/5 given ongoing sepsis physiology showed significant retroperitoneal contamination.  Taken back to the OR 5/6 with findings of significant infection status post creation of diverting colostomy and drain placement x3 as well as a Penrose drain in the peritoneal space.  Cultures from the OR at this time were notable for E. coli, E faecalis, anaerobes.  Follow-up CT scan 5/12 showed edema/inflammation and soft tissue gas in the pelvic floor and presacral space with surgical drains in place.  He did have a 5.3 x 3.5 x 7.2 cm fluid collection of the left upper quadrant as well as intramuscular fluid/edema in the thighs left greater than right concerning for possible cellulitis/myositis.  IR was consulted for possible drainage of the left upper quadrant fluid collection, however, this was deemed too risky of an anatomical location to do so.  Another CT scan was obtained 5/14 was notable for overall stability in the pelvis.  The left upper quadrant fluid collection had decreased in size since the prior study and was felt to be contiguous with the left lower quadrant surgical drain. Intramuscular left thigh fluid collection: CT 5/14 noted progression of intramuscular fluid collections within the left thigh concerning for intramuscular abscesses.  IR recommended MRI which was attempted, but he was unable to tolerate this. Follow up lower extremity  ultrasound 5/16 shows what appears consistent with abscesses and now s/p IR aspiration/drainage 5/16. Cultures are pending. Fevers: Suspect secondary to undrained fluid collections versus possible PICC line as well given that his fever development seems to coincide with PICC placement on 4/28.  Fever curve has improved s/p IR aspiration.  Penicillin allergy: Have discussed at length with patient and partner.  At this time, would rather exclude other ongoing potential causes of fever rather than attribute to Zosyn. Protein calorie malnutrition: Hopefully can stay off TPN and remove PICC.   RECOMMENDATIONS:    Continue Zosyn Follow-up cultures Remove PICC line once able to do so after TPN finishes Wound care Lab monitoring Will follow   Principal Problem:   Rectal perforation (HCC) Active Problems:   Abdominal pain   Acute urinary retention   Sepsis (HCC)   Ileus following gastrointestinal surgery (HCC)   Normal anion gap metabolic acidosis   Anemia   Post-op pain    MEDICATIONS:    Scheduled Meds:  (feeding supplement) PROSource Plus  30 mL Oral TID PC & HS   acetaminophen  1,000 mg Oral Q6H   chlorhexidine  15 mL Mouth Rinse BID   Chlorhexidine Gluconate Cloth  6 each Topical Q0600   famotidine  20 mg Oral BID   feeding supplement  237 mL Oral TID BM   ferrous sulfate  325 mg Oral TID   HYDROmorphone   Intravenous Q4H   lip balm  1 application. Topical BID   LORazepam  0.5 mg Oral BID   mouth rinse  15 mL Mouth Rinse q12n4p  metoprolol tartrate  50 mg Oral BID   multivitamin with minerals  1 tablet Oral Daily   sodium chloride flush  10-40 mL Intracatheter Q12H   Continuous Infusions:  sodium chloride 10 mL/hr at 03/31/22 1800   chlorproMAZINE (THORAZINE) IV     methocarbamol (ROBAXIN) IV Stopped (03/29/22 1806)   ondansetron (ZOFRAN) IV     piperacillin-tazobactam (ZOSYN)  IV 3.375 g (04/01/22 0648)   TPN ADULT (ION) 50 mL/hr at 03/31/22 1800   PRN Meds:.sodium  chloride, albuterol, azelastine, chlorproMAZINE (THORAZINE) IV, diphenhydrAMINE, gadobutrol, ibuprofen, LORazepam, magic mouthwash, menthol-cetylpyridinium, methocarbamol (ROBAXIN) IV, naloxone **AND** sodium chloride flush, ondansetron (ZOFRAN) IV **OR** ondansetron (ZOFRAN) IV, ondansetron **OR** [DISCONTINUED] ondansetron (ZOFRAN) IV, simethicone, sodium chloride flush, traZODone  SUBJECTIVE:   24 hour events:  TPN off per surgery.   WBC 8.4 Continued on zosyn IR cx NGTD Tmax 99.9  No new complaints this morning.  Appears to be doing better and more alert, talkative.  No fevers.  Tolerating Zosyn.  Review of Systems  All other systems reviewed and are negative.    OBJECTIVE:   Blood pressure (!) 143/73, pulse 95, temperature 97.9 F (36.6 C), temperature source Axillary, resp. rate (!) 31, height '5\' 9"'$  (1.753 m), weight 88.6 kg, SpO2 91 %. Body mass index is 28.84 kg/m.  Physical Exam Constitutional:      General: He is not in acute distress.    Appearance: Normal appearance.  HENT:     Head: Normocephalic and atraumatic.  Pulmonary:     Effort: Pulmonary effort is normal. No respiratory distress.  Musculoskeletal:     Cervical back: Normal range of motion and neck supple.     Right lower leg: No edema.     Left lower leg: No edema.     Comments: PICC line in place  Skin:    General: Skin is warm and dry.     Findings: No rash.  Neurological:     General: No focal deficit present.     Mental Status: He is alert and oriented to person, place, and time.  Psychiatric:        Mood and Affect: Mood normal.        Behavior: Behavior normal.     Lab Results: Lab Results  Component Value Date   WBC 8.4 04/01/2022   HGB 7.8 (L) 04/01/2022   HCT 22.9 (L) 04/01/2022   MCV 95.0 04/01/2022   PLT 456 (H) 04/01/2022    Lab Results  Component Value Date   NA 136 04/01/2022   K 3.7 04/01/2022   CO2 23 04/01/2022   GLUCOSE 108 (H) 04/01/2022   BUN 27 (H) 04/01/2022    CREATININE 1.06 04/01/2022   CALCIUM 7.5 (L) 04/01/2022   GFRNONAA >60 04/01/2022   GFRAA  03/23/2022    QUESTIONABLE RESULTS, RECOMMEND RECOLLECT TO VERIFY    Lab Results  Component Value Date   ALT 30 04/01/2022   AST 33 04/01/2022   ALKPHOS 338 (H) 04/01/2022   BILITOT 0.7 04/01/2022    No results found for: CRP  No results found for: ESRSEDRATE   I have reviewed the micro and lab results in Epic.  Imaging: [place] LT LOWER EXTREM LTD SOFT TISSUE NON VASCULAR  Result Date: 03/30/2022 CLINICAL DATA:  71 year old male with history of recent diverting colostomy status post hemorrhoid embolization procedure with development of left lower extremity swelling and possible fluid collections. EXAM: ULTRASOUND LEFT LOWER EXTREMITY LIMITED TECHNIQUE: Ultrasound examination of the lower extremity soft  tissues was performed in the area of clinical concern. COMPARISON:  03/28/2022, 03/29/2022 FINDINGS: Within the mid to distal posterolateral thigh musculature are multifocal, irregularly-shaped heterogeneously hypoechoic structures without significant internal vascularity. Given morphologic irregularity, the fluid collections are difficult to measure in entirety. IMPRESSION: Multifocal, irregularly-shaped mildly complex fluid collections within the mid to distal left posterolateral thigh musculature. Favored to represent abscesses given findings on comparison studies. Ruthann Cancer, MD [facility] Radiology Electronically Signed   By: Ruthann Cancer M.D.   On: 03/30/2022 10:06   [place] FINE NEEDLE ASP 1ST LESION  Result Date: 03/31/2022 INDICATION: 71 year old male with history of recent diverting colostomy status post hemorrhoid embolization procedure with development of left lower extremity intramuscular fluid collections concerning for abscess. EXAM: Ultrasound-guided aspiration of left lower extremity fluid collection MEDICATIONS: The patient is currently  admitted to the hospital and receiving intravenous antibiotics. The antibiotics were administered within an appropriate time frame prior to the initiation of the procedure. ANESTHESIA/SEDATION: None. COMPLICATIONS: None immediate. PROCEDURE: Informed written consent was obtained from the patient after a thorough discussion of the procedural risks, benefits and alternatives. All questions were addressed. Maximal Sterile Barrier Technique was utilized including caps, mask, sterile gowns, sterile gloves, sterile drape, hand hygiene and skin antiseptic. A timeout was performed prior to the initiation of the procedure. Preprocedure ultrasound evaluation of the left lower extremity was performed. Within the intramuscular aspect of the posterior left thigh in the mid to distal portions are phlegmonous changes with a few interspersed simple appearing fluid collections. The procedure was planned. The left lower extremity is prepped and draped in standard fashion. Subdermal Local anesthesia was administered at the planned needle entry site with 1% lidocaine. Under direct ultrasound visualization, a 5 [place], 7 cm Yueh catheter was directed into the fluid collection. Aspiration was performed which yielded approximately 10 mL of purulent fluid. Samples were sent for culture. The needle was removed. Postprocedure ultrasound of it patient demonstrated no evidence of hematoma or other complicating features. A bandage was applied. The patient tolerated the procedure well was transferred back to the ICU in stable condition. IMPRESSION: Technically successful ultrasound-guided aspiration of left posterior thigh intramuscular abscess yielding approximately 10 mL of purulent fluid. Ruthann Cancer, MD [facility] Radiology Electronically Signed   By: Ruthann Cancer M.D.   On: 03/31/2022 08:07     Imaging independently reviewed in Epic.    [facility] for Infectious  Disease [facility] Group [phone] pager 04/01/2022, 8:11 AM

## 2022-04-01 NOTE — Progress Notes (Signed)
NAME:  Tyler [name], MRN:  [id number], DOB:  [birth year], LOS: 82 ADMISSION DATE:  03/09/2022, CONSULTATION DATE:  04/01/22 REFERRING MD:  Dr. Marcello Moores, Surgery CHIEF COMPLAINT:  sepsis   History of Present Illness:  71 yo male former smoker had transanal hemorrhoidal dearterialization on 4/21.  Presented to [facility] ER on 4/25 with nausea, fever, and abdominal pain.  CT abd/pelvis showed rectal wall thickening.  Admit by surgery and started on antibiotics.  Had persistent pain and fever.  Repeat CT abdomen/pelvis showed possible perforation.  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  He had initial improvement, but then had recurrent symptoms.  He was taken to the OR on 5/06 and had diagnostic laparotomy, abdominal wash out, drain placement and diverting colostomy.  PCCM consulted to assist with medical management in ICU.  Pertinent  Medical History  Allergies, Shingles, ED, GERD, Pityriasis rosea, Thrombocytopenia, Colon polyp, HTN, Asthma, Osteoarthritis  Significant Hospital Events:   4/21 hemorrhoidal dearterialization 4/25 admitted to surgery with fever and pain, thickening of rectal wall  5/01 Taken to OR for anal canal I&D and rectal wall necrosis debridement 5/03 signs of worsening sepsis, transfer to progressive and PCCM consult 5/04 HR better end organ fxn stable 5/06 Has worsening infection on CT scan and patient taken back to OR for diverting colostomy with ostomy creation and abd wash out.  PCCM reconsulted for hypotension.  5/6-5/9 PCCM followed for possible evolving sepsis. Has had intermittent fevers however leukocytosis has resolved and clinically improving 5/14 PCCM reconsulted for tachycardia and possible sepsis; IR and ID consulted 5/16 Tm 102.6 F, MRI Lt femur negative for abscess but ultrasound positive for fluid collection >> aspirated by IR   Interim History / Subjective:  Feels weak, but otherwise feels  like he is turning the corner.  Not having chest pain, dyspnea, or abdominal pain.  Having trouble sleeping.  Objective   Blood pressure (!) 143/73, pulse 95, temperature 97.9 F (36.6 C), temperature source Axillary, resp. rate (!) 31, height '5\' 9"'$  (1.753 m), weight 88.6 kg, SpO2 91 %.    FiO2 (%):  [21 %] 21 %   Intake/Output Summary (Last 24 hours) at 04/01/2022 0805 Last data filed at 04/01/2022 1761 Gross per 24 hour  Intake 644.27 ml  Output 4415 ml  Net -3770.73 ml   Filed Weights   03/18/22 0800 03/24/22 1208 03/26/22 0400  Weight: 82.2 kg 73 kg 88.6 kg    Physical Exam:  General - alert Eyes - pupils reactive ENT - no sinus tenderness, no stridor Cardiac - regular rate/rhythm, no murmur Chest - equal breath sounds b/l, no wheezing or rales Abdomen - drains, ostomy in place Extremities - no cyanosis, clubbing, or edema Skin - no rashes Neuro - normal strength, moves extremities, follows commands Psych - normal mood and behavior   Assessment & Plan:   Sepsis 2nd to anal abscess with E coli and Enterococcus faecalis in wound culture. Persistent fever likely from Lt thigh abscess. - Day 24 of Abx, changed to zosyn on 5/15 per ID - f/u abscess culture from 5/16 - d/c PICC line once he is transitioned off TPN  Anemia from GI blood loss and critical illness. - f/u CBC intermittently - transfuse for Hb < 7  - continue ferrous sulfate  Sinus tachycardia, hypervolemia.Marland Kitchen Hx of hypertension. - change to lopressor 50 mg bid - had good response to diuresis  on 5/16 and 5/17 >> hold additional lasix for now  Hx of allergic asthma. - prn albuterol  Anxiety, insomnia. - change ativan to po - add trazodone qhs prn  PCCM will sign off.  Please call if additional help needed while he is in hospital.  Best Practice (right click and "Reselect all SmartList Selections" daily)   Diet/type: Regular consistency (see orders) and TPN DVT prophylaxis: SCD  GI prophylaxis:  PPI Lines: yes and it is still needed, double lumen Rt PICC 03/12/22 Foley:  Yes, and it is still needed Code Status:  full code Last date of multidisciplinary goals of care discussion [per primary ]  Labs:      Latest Ref Rng & Units 04/01/2022    3:43 AM 03/31/2022    3:41 AM 03/30/2022    5:27 AM  CMP  Glucose 70 - 99 mg/dL 108   116   86    BUN 8 - 23 mg/dL '27   29   28    '$ Creatinine 0.61 - 1.24 mg/dL 1.06   1.15   0.96    Sodium 135 - 145 mmol/L 136   135   133    Potassium 3.5 - 5.1 mmol/L 3.7   3.8   4.3    Chloride 98 - 111 mmol/L 105   104   105    CO2 22 - 32 mmol/L '23   23   21    '$ Calcium 8.9 - 10.3 mg/dL 7.5   7.6   7.6    Total Protein 6.5 - 8.1 g/dL 5.8      Total Bilirubin 0.3 - 1.2 mg/dL 0.7      Alkaline Phos 38 - 126 U/L 338      AST 15 - 41 U/L 33      ALT 0 - 44 U/L 30           Latest Ref Rng & Units 04/01/2022    3:43 AM 03/31/2022    3:41 AM 03/30/2022    5:27 AM  CBC  WBC 4.0 - 10.5 K/uL 8.4   11.0   10.4    Hemoglobin 13.0 - 17.0 g/dL 7.8   8.1   8.7    Hematocrit 39.0 - 52.0 % 22.9   24.7   25.6    Platelets 150 - 400 K/uL 456   470   429      ABG    Component Value Date/Time   PHART 7.5 (H) 03/28/2022 0843   PCO2ART 25 (L) 03/28/2022 0843   PO2ART 61 (L) 03/28/2022 0843   HCO3 19.5 (L) 03/28/2022 0843   TCO2 26 03/05/2022 1110   ACIDBASEDEF 2.1 (H) 03/28/2022 0843   O2SAT 95.8 03/28/2022 0843    CBG (last 3)  No results for input(s): GLUCAP in the last 72 hours.   Signature:  Chesley Mires, MD [place] Pager - [phone] 04/01/2022, 8:05 AM

## 2022-04-02 MED ORDER — SODIUM CHLORIDE 0.9 % IV SOLN
100.0000 mg | INTRAVENOUS | Status: DC
  Administered 2022-04-02 – 2022-04-08 (×7): 100 mg via INTRAVENOUS
  Filled 2022-04-02 (×8): qty 5

## 2022-04-02 MED ORDER — OXYCODONE HCL 5 MG PO TABS: 5.0000 mg | ORAL_TABLET | ORAL | Status: DC | PRN

## 2022-04-02 MED ORDER — HYDROMORPHONE HCL 1 MG/ML IJ SOLN: 0.5000 mg | INTRAMUSCULAR | Status: DC | PRN

## 2022-04-02 MED ORDER — ACETAMINOPHEN 500 MG PO TABS
1000.0000 mg | ORAL_TABLET | Freq: Four times a day (QID) | ORAL | Status: DC | PRN
  Administered 2022-04-02 – 2022-04-03 (×2): 1000 mg via ORAL
  Filled 2022-04-02 (×2): qty 2

## 2022-04-02 MED ORDER — PANTOPRAZOLE SODIUM 40 MG PO TBEC
40.0000 mg | DELAYED_RELEASE_TABLET | Freq: Every day | ORAL | Status: DC
  Administered 2022-04-02 – 2022-04-07 (×6): 40 mg via ORAL
  Filled 2022-04-02 (×6): qty 1

## 2022-04-02 MED ORDER — AMLODIPINE BESYLATE 5 MG PO TABS
5.0000 mg | ORAL_TABLET | Freq: Every morning | ORAL | Status: DC
  Administered 2022-04-03: 5 mg via ORAL
  Filled 2022-04-02: qty 1

## 2022-04-02 NOTE — Plan of Care (Signed)

## 2022-04-02 NOTE — Progress Notes (Signed)
PT Cancellation Note  Patient Details Name: Tyler [name] MRN: [id number] DOB: 1951/11/09   Cancelled Treatment:    Reason Eval/Treat Not Completed: Fatigue/lethargy limiting ability to participate, recently  with nursing  for hygiene, and not feeling up to PT, also to be transferred to another  unit.  [place] Pager [phone] Office [phone]    Claretha Cooper 04/02/2022, 3:07 PM

## 2022-04-02 NOTE — Progress Notes (Signed)
RN noticed no labs have been ordered/drawn today and no orders for labs to be drawn in the AM either. Paged MD to see if they wanted to order labs since yesterdays note reflects that. No return call. Phoned the on-call service with a return call from Dr. Donne Hazel who stated that Dr. Marcello Moores will see pt in the morning and can order labs then if she wants them done.

## 2022-04-02 NOTE — Progress Notes (Addendum)
Brief ID note:  Patient appears stable today and remains afebrile over the last 24 hours with a Tmax of 99.1.  Appears that his PICC line has been removed as of this morning.  Cultures from his thigh abscess aspiration are still preliminary, however, updated late this morning with Candida albicans.  He continues on Zosyn for his intra-abdominal infection and abscess with drains in place.  Will add micafungin (has allergy to fluconazole) given this culture update and continue Zosyn.  Dr. Gale Journey available as needed over the weekend and he will take over on Monday.   Raynelle Highland for Infectious Disease [facility] 04/02/2022, 12:12 PM

## 2022-04-02 NOTE — Progress Notes (Signed)
Inpatient Rehab Admissions Coordinator:   Met with patient at the bedside to discuss CIR recommendations and goals/expectations of CIR stay.  Reviewed 3 hrs/day of therapy, physician follow, and average length of stay 2 weeks (depending on progress) with goals of supervision to modified independence.  We discussed potential need for 24/7 supervision initially and he reports his partner and other friends are available to assist as needed.  I reviewed insurance process and will start Monday with updated therapy notes, as long as patient remains medically stable.   Shann Medal, PT, DPT Admissions Coordinator [phone] 04/02/22  3:03 PM

## 2022-04-03 ENCOUNTER — Encounter (HOSPITAL_COMMUNITY): Payer: Self-pay | Admitting: General Surgery

## 2022-04-03 DIAGNOSIS — E43 Unspecified severe protein-calorie malnutrition: Secondary | ICD-10-CM

## 2022-04-03 DIAGNOSIS — F418 Other specified anxiety disorders: Secondary | ICD-10-CM

## 2022-04-03 DIAGNOSIS — Z933 Colostomy status: Secondary | ICD-10-CM

## 2022-04-03 DIAGNOSIS — K651 Peritoneal abscess: Secondary | ICD-10-CM

## 2022-04-03 DIAGNOSIS — L02419 Cutaneous abscess of limb, unspecified: Secondary | ICD-10-CM

## 2022-04-03 HISTORY — DX: Other specified anxiety disorders: F41.8

## 2022-04-03 MED ORDER — LACTATED RINGERS IV BOLUS
1000.0000 mL | Freq: Three times a day (TID) | INTRAVENOUS | Status: AC | PRN
Start: 2022-04-03 — End: 2022-04-05

## 2022-04-03 MED ORDER — LORAZEPAM 2 MG/ML IJ SOLN: 0.5000 mg | Freq: Three times a day (TID) | INTRAMUSCULAR | Status: DC | PRN

## 2022-04-03 MED ORDER — IBUPROFEN 200 MG PO TABS
400.0000 mg | ORAL_TABLET | Freq: Three times a day (TID) | ORAL | Status: DC | PRN
  Administered 2022-04-03: 400 mg via ORAL
  Administered 2022-04-05: 800 mg via ORAL
  Filled 2022-04-03: qty 4

## 2022-04-03 MED ORDER — PHENOL 1.4 % MT LIQD
2.0000 | OROMUCOSAL | Status: DC | PRN
Start: 2022-04-03 — End: 2022-04-09

## 2022-04-03 MED ORDER — METHOCARBAMOL 500 MG PO TABS: 1000.0000 mg | ORAL_TABLET | Freq: Four times a day (QID) | ORAL | Status: DC | PRN

## 2022-04-03 MED ORDER — ALUM & MAG HYDROXIDE-SIMETH 200-200-20 MG/5ML PO SUSP: 30.0000 mL | Freq: Four times a day (QID) | ORAL | Status: DC | PRN

## 2022-04-03 MED ORDER — LORAZEPAM 0.5 MG PO TABS: 0.5000 mg | ORAL_TABLET | Freq: Three times a day (TID) | ORAL | Status: DC | PRN

## 2022-04-03 MED ORDER — METOPROLOL TARTRATE 50 MG PO TABS
50.0000 mg | ORAL_TABLET | Freq: Two times a day (BID) | ORAL | Status: DC
Start: 2022-04-03 — End: 2022-04-04
  Administered 2022-04-03: 50 mg via ORAL
  Filled 2022-04-03: qty 1

## 2022-04-03 MED ORDER — PROCHLORPERAZINE EDISYLATE 10 MG/2ML IJ SOLN: 5.0000 mg | INTRAMUSCULAR | Status: DC | PRN

## 2022-04-03 MED ORDER — LACTATED RINGERS IV BOLUS
1000.0000 mL | Freq: Three times a day (TID) | INTRAVENOUS | Status: DC | PRN
Start: 2022-04-03 — End: 2022-04-03

## 2022-04-03 MED ORDER — SODIUM CHLORIDE 0.9 % IV SOLN
125.0000 mg | Freq: Once | INTRAVENOUS | Status: AC
  Administered 2022-04-03: 125 mg via INTRAVENOUS
  Filled 2022-04-03: qty 10

## 2022-04-03 MED ORDER — HYDROMORPHONE HCL 1 MG/ML IJ SOLN: 0.5000 mg | INTRAMUSCULAR | Status: DC | PRN

## 2022-04-03 MED ORDER — METHOCARBAMOL 1000 MG/10ML IJ SOLN
1000.0000 mg | Freq: Four times a day (QID) | INTRAMUSCULAR | Status: DC | PRN
Start: 2022-04-03 — End: 2022-04-06

## 2022-04-03 MED ORDER — CALCIUM POLYCARBOPHIL 625 MG PO TABS
625.0000 mg | ORAL_TABLET | Freq: Two times a day (BID) | ORAL | Status: DC
Start: 2022-04-03 — End: 2022-04-09
  Administered 2022-04-03 – 2022-04-09 (×11): 625 mg via ORAL
  Filled 2022-04-03 (×12): qty 1

## 2022-04-03 MED ORDER — ENALAPRILAT 1.25 MG/ML IV SOLN: 0.6250 mg | Freq: Four times a day (QID) | INTRAVENOUS | Status: DC | PRN

## 2022-04-03 MED ORDER — ACETAMINOPHEN 500 MG PO TABS
1000.0000 mg | ORAL_TABLET | Freq: Four times a day (QID) | ORAL | Status: DC
  Administered 2022-04-03 – 2022-04-09 (×19): 1000 mg via ORAL
  Filled 2022-04-03 (×21): qty 2

## 2022-04-03 NOTE — Progress Notes (Signed)
Dressing change done to lower abd incision site. Drains' dsgs changed. Colostomy bag changed. Pt tolerated with no problem.

## 2022-04-03 NOTE — Progress Notes (Signed)
14 Days Post-Op   Subjective/Chief Complaint: Pt weak and tired  Having sweats no fever    Objective: Vital signs in last 24 hours: Temp:  [98.2 F (36.8 C)-99 F (37.2 C)] 99 F (37.2 C) (05/20 0423) Pulse Rate:  [89-104] 95 (05/20 0423) Resp:  [16-24] 18 (05/20 0423) BP: (130-154)/(79-94) 130/82 (05/20 0423) SpO2:  [95 %-100 %] 95 % (05/20 0423) Last BM Date : 04/02/22  Intake/Output from previous day: 05/19 0701 - 05/20 0700 In: 559.6 [P.O.:360; IV Piggyback:199.6] Out: 2355 [Urine:1775; Drains:130; Stool:450] Intake/Output this shift: No intake/output data recorded.   General appearance: alert and cooperative GI: soft, non-distended, less TTP Pelvis JP with purulent fluid Sub Q and RP drains with cloudy, purulent fluid Wet to dry dressing in place Colostomy viable with stool and air Lab Results:  Recent Labs    04/01/22 0343  WBC 8.4  HGB 7.8*  HCT 22.9*  PLT 456*   BMET Recent Labs    04/01/22 0343  NA 136  K 3.7  CL 105  CO2 23  GLUCOSE 108*  BUN 27*  CREATININE 1.06  CALCIUM 7.5*   PT/INR No results for input(s): LABPROT, INR in the last 72 hours. ABG No results for input(s): PHART, HCO3 in the last 72 hours.  Invalid input(s): PCO2, PO2  Studies/Results: No results found.  Anti-infectives: Anti-infectives (From admission, onward)    Start     Dose/Rate Route Frequency Ordered Stop   04/02/22 1400  micafungin (MYCAMINE) 100 mg in sodium chloride 0.9 % 100 mL IVPB        100 mg 110 mL/hr over 1 Hours Intravenous Every 24 hours 04/02/22 1224     03/29/22 1400  piperacillin-tazobactam (ZOSYN) IVPB 3.375 g        3.375 g 12.5 mL/hr over 240 Minutes Intravenous Every 8 hours 03/29/22 1231     03/28/22 1430  meropenem (MERREM) 1 g in sodium chloride 0.9 % 100 mL IVPB  Status:  Discontinued        1 g 200 mL/hr over 30 Minutes Intravenous Every 8 hours 03/28/22 1336 03/29/22 1231   03/25/22 1000  vancomycin (VANCOCIN) IVPB 1000 mg/200 mL  premix  Status:  Discontinued        1,000 mg 200 mL/hr over 60 Minutes Intravenous Every 24 hours 03/24/22 0840 03/29/22 1231   03/24/22 2000  ceFAZolin (ANCEF) IVPB 2g/100 mL premix  Status:  Discontinued        2 g 200 mL/hr over 30 Minutes Intravenous Every 8 hours 03/24/22 1230 03/28/22 1326   03/24/22 1100  metroNIDAZOLE (FLAGYL) IVPB 500 mg  Status:  Discontinued        500 mg 100 mL/hr over 60 Minutes Intravenous Every 12 hours 03/24/22 0848 03/28/22 1326   03/24/22 1000  ceFAZolin (ANCEF) IVPB 2g/100 mL premix  Status:  Discontinued        2 g 200 mL/hr over 30 Minutes Intravenous Every 8 hours 03/24/22 0811 03/24/22 1230   03/24/22 0930  vancomycin (VANCOREADY) IVPB 1500 mg/300 mL        1,500 mg 150 mL/hr over 120 Minutes Intravenous  Once 03/24/22 0840 03/24/22 1147   03/22/22 2200  piperacillin-tazobactam (ZOSYN) IVPB 3.375 g  Status:  Discontinued        3.375 g 12.5 mL/hr over 240 Minutes Intravenous Every 8 hours 03/22/22 1517 03/24/22 0811   03/22/22 1700  vancomycin (VANCOCIN) IVPB 1000 mg/200 mL premix  Status:  Discontinued  1,000 mg 200 mL/hr over 60 Minutes Intravenous Every 24 hours 03/21/22 1727 03/22/22 0844   03/20/22 0647  vancomycin (VANCOCIN) 1-5 GM/200ML-% IVPB       Note to Pharmacy: Dara Lords M: cabinet override      03/20/22 0647 03/20/22 0832   03/18/22 0500  vancomycin (VANCOCIN) IVPB 1000 mg/200 mL premix  Status:  Discontinued        1,000 mg 200 mL/hr over 60 Minutes Intravenous Every 12 hours 03/17/22 1614 03/21/22 1727   03/17/22 1700  vancomycin (VANCOREADY) IVPB 1500 mg/300 mL        1,500 mg 150 mL/hr over 120 Minutes Intravenous  Once 03/17/22 1609 03/17/22 1927   03/17/22 1000  metroNIDAZOLE (FLAGYL) IVPB 500 mg  Status:  Discontinued        500 mg 100 mL/hr over 60 Minutes Intravenous Every 12 hours 03/17/22 0915 03/22/22 1517   03/14/22 1400  ceFEPIme (MAXIPIME) 2 g in sodium chloride 0.9 % 100 mL IVPB  Status:  Discontinued         2 g 200 mL/hr over 30 Minutes Intravenous Every 8 hours 03/14/22 0845 03/22/22 1517   03/10/22 1400  valACYclovir (VALTREX) tablet 500 mg  Status:  Discontinued        500 mg Oral Every morning 03/10/22 1021 03/21/22 0751   03/10/22 0500  ceFEPIme (MAXIPIME) 2 g in sodium chloride 0.9 % 100 mL IVPB  Status:  Discontinued        2 g 200 mL/hr over 30 Minutes Intravenous Every 12 hours 03/09/22 1750 03/14/22 0845   03/10/22 0400  metroNIDAZOLE (FLAGYL) IVPB 500 mg        500 mg 100 mL/hr over 60 Minutes Intravenous Every 12 hours 03/09/22 1734 03/16/22 0705   03/09/22 1645  ceFEPIme (MAXIPIME) 2 g in sodium chloride 0.9 % 100 mL IVPB        2 g 200 mL/hr over 30 Minutes Intravenous  Once 03/09/22 1635 03/09/22 1721   03/09/22 1630  metroNIDAZOLE (FLAGYL) IVPB 500 mg  Status:  Discontinued        500 mg 100 mL/hr over 60 Minutes Intravenous Every 12 hours 03/09/22 1621 03/09/22 1743       Assessment/Plan: s/p Procedure(s): DIAGNOSTIC LAPAROSCOPY; ABDOMINAL [place] OUT AND DRAIN PLACEMENT; CREATION OF DIVERTING COLOSTOMY; ANAL EXAM UNDER ANESTHESIA (N/A) Pelvic sepsis.  Currently drained and debrided.     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     OR findings 5/6: significant RP infection, drained via L pericolic gutter, presacral infection, drained, anterior abd wall infection, drained.   CT 5/14: L RP fluid collection slightly small, L thigh fluid collection stable   IR aspiration: 5/16, cultures pending Plan: Nutrition: Advance protein supplements.  TPN off  ID: Cont IV antibiotics per ID.  Recheck cbc in AM.   Schedule metoprolol for tachycardia Cont scheduled tylenol  Anxiety: ativan scheduled and PRN Cont SCD's dressing changes q shift Cont foley d/t urinary retention  LOS: 24 days    Turner Daniels MD  04/03/2022

## 2022-04-03 NOTE — Progress Notes (Addendum)
Tyler [name]  1951-05-06 [id number]  Patient Care Team: Cari Caraway, MD as PCP - General (Family Medicine) Leighton Ruff, MD as Consulting Physician (General Surgery) Gatha Mayer, MD as Consulting Physician (Gastroenterology)  Called made by patient's significant other, Tyler [name].  He had paged [facility] surgery & reached my partner Dr. Brantley Stage early this morning hoping to reach Dr. Marcello Moores.  Dr Brantley Stage noted she was not available till Monday the weekend but spoke with Tyler [name].  Tyler [name] did note he did talk to Dr. Marcello Moores yesterday but was adamant on talking with all MDs.    Tyler. Lauretta [name] called again tonight.  I called back.  He noted many concerns and wished to express them.  He wished to go over the very difficult complicated course of the patient and his many concerns.  I gave him a chance to note his concerns of the patient's hospital course.    Tyler. Lauretta [name] is concerned that there were no labs ordered in the past 48 hours.  I noted there were orders for the morning and we are trying to give the patient a lab holiday since electrolytes had been stable for the past 5 days & WBC normal on last check with clinical improvement.  Despite a very difficult road, I noted that the patient's shock had resolved, he had been able to safely come out of the intensive care unit, he was advancing his diet and weaned off IV nutrition.  He was on a solid diet.  He appeared to be on appropriate antibiotics.  I updated that the cultures of the last drain 4 days ago did grow out Candida and Dr. Juleen China with infectious disease had already started the patient on an antifungal agent yesterday in addition to the piperacillin/tazobactam..  That seems somewhat reassuring.  I reviewed last weeks latest CT scan and agreed that there is a fluid collection anterior to the kidney that is not accessible by drainage.  However the patient is afebrile, white count is normal, he is not in shock nor tachycardia, he is on the  appropriate antibiotics, therefore, things seem to be improving.  May need to reconsider repeat CT scan next week to follow undrained collection and rule out new collections, but I will defer to Dr. Marcello Moores on that.  Tyler [name] concerned about the numerous drains and high output.  It looks like they are slowly tapering off.  I noted that the hope would be is as the patient is further out with fecal diversion, appropriate antibiotics and improve nutrition, the abscess will heal, drainage output will come down & hopefully the drains can eventually be removed over the next few weeks.  Patient is followed by wound ostomy nurses who are helping with colostomy care and wound care management.  Hopefully wounds will close down over time.  We will see.  Tyler. Lauretta [name] was concerned that physical therapy had not come by.  It looks like they came by yesterday but it was canceled due to nursing care and patient exhaustion.  I noted we will try and get physical therapy to work with the patient and noted they would be more likely successful with the patient was out of the intensive care unit and on the floor bed.  I sense that Tyler. D was skeptical that the patient was able to get adequate nursing care on the floor since the patient's colostomy had a bad leaking event this morning and it was a challenge to get the patient up and mobilize.  I offered a different floor but it sounded like he was okay with staying on 5E and was hesitant to return the patient to [facility] surgical floor where the patient had been in the past.  He did not necessarily want the patient to go back to the stepdown unit nor did I want the patient to go back there without a strong indication.  There was no complaint of nausea or uncontrolled pain but it sound like the patient was uncomfortable from therapy.  We will place back on some scheduled Tylenol and continue PRN medications to help out.  We will give IV iron given the anemia and fair p.o. intake in the hopes that  we will help improve his anemia as well.  Rechecking hemoglobin in the morning.  Looking in the [facility], he does use lorazepam 2-3 times a day.  Will back off from q4h PRN to q8H & then every 12 hours PRN to avoid oversedation.  I tried to give the patient's significant other, Tyler. Lauretta [name], time to express concerns.  I tried to empathize his need to double/triple check things given the very complicated and atypical course.  I tried to give hope that the worst is behind and things should gradually improve but it is going to take months for rehab and recovery.  Surgery will continue to lead care with help from nursing, therapy, infectious disease, nutrition, pharmacy, etc. 20-minute phone conversation & additional 30-minute review of labs, data, and orders.  Adin Hector, MD, FACS, MASCRS Esophageal, Gastrointestinal & Colorectal Surgery Robotic and Minimally Invasive Surgery  [facility], [place]  [place]. 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 04/03/2022   Protein-calorie malnutrition, severe ([place]) 04/03/2022   Anxiety associated with depression 04/03/2022   Intra-abdominal abscess ([place]) 04/03/2022   Thigh abscess 04/03/2022   Normal anion gap metabolic acidosis 87/86/7672   Acute blood loss anemia (ABLA) 03/18/2022   Acute urinary retention 03/12/2022   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   Mild persistent asthma 03/12/2022   Abdominal pain 03/09/2022   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 03/09/2022   Pruritus ani 04/20/2018   Arthritis of both knees 01/13/2018   Hx of adenomatous polyp of colon 04/07/2017   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   Perianal dermatitis 01/14/2016   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/12/2013   History of tobacco use 11/12/2013   Low testosterone 09/19/2013   Sinusitis 12/21/2012   Herpes labialis 10/30/2012   Eczema 10/30/2012   Prolapsed internal hemorrhoids, grade 3, THD ligation 03/07/2022 10/30/2012   Hypertension 04/13/2011   FASCIITIS, PLANTAR 05/14/2010   NEOPLASM, SKIN, UNCERTAIN BEHAVIOR 09/47/0962   Chronic rhinitis 08/16/2008   Rash and other nonspecific skin eruption 07/02/2008   ABNORMAL COAGULATION PROFILE 05/29/2008   Gastroesophageal reflux disease 06/28/2007   PITYRIASIS ROSEA 06/28/2007   Osteoarthritis of knee 06/28/2007   Asthmatic bronchitis 05/11/2007     Patient Active Problem List   Diagnosis Date Noted  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 04/03/2022   Protein-calorie malnutrition, severe ([place]) 04/03/2022   Anxiety associated with depression 04/03/2022   Intra-abdominal abscess ([place]) 04/03/2022   Thigh abscess 04/03/2022   Normal anion gap metabolic acidosis 16/08/9603   Acute blood loss anemia (ABLA) 03/18/2022   Acute urinary retention 03/12/2022   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   Mild persistent asthma 03/12/2022   Abdominal pain 03/09/2022   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/09/2022   Pruritus ani 04/20/2018   Arthritis of both knees 01/13/2018   Hx of adenomatous polyp of colon 04/07/2017   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   Perianal dermatitis 01/14/2016   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/12/2013   History of tobacco use 11/12/2013   Low testosterone 09/19/2013   Sinusitis  12/21/2012   Herpes labialis 10/30/2012   Eczema 10/30/2012   Prolapsed internal hemorrhoids, grade 3, THD ligation 03/07/2022 10/30/2012   Hypertension 04/13/2011   FASCIITIS, PLANTAR 05/14/2010   NEOPLASM, SKIN, UNCERTAIN BEHAVIOR 54/07/8118   Chronic rhinitis 08/16/2008   Rash and other nonspecific skin eruption 07/02/2008   ABNORMAL COAGULATION PROFILE 05/29/2008   Gastroesophageal reflux disease 06/28/2007   PITYRIASIS ROSEA 06/28/2007   Osteoarthritis of knee 06/28/2007   Asthmatic bronchitis 05/11/2007    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), bilateral    Chronic pruritic rash in adult 2004   hx shingles  w/ residual recurrent rash, take valtrex daily   ED (erectile dysfunction)    GERD (gastroesophageal reflux disease)    H/O pityriasis rosea    History of basal cell carcinoma (BCC) excision    2022 left calf s/p mohs   History of pertussis 2018   History of SCC (squamous cell carcinoma) of skin    removed from head   History of thrombocytopenia 12/03/2010   Hx of adenomatous polyp of colon 04/07/2017   Hypertension    Mild asthma    followed by pcp   OA (osteoarthritis)    knees, fingers   Prolapsed internal hemorrhoids, grade 3    hx  external hemorroid banding   Wears glasses     Past Surgical History:  Procedure Laterality Date   COLONOSCOPY WITH PROPOFOL  04/01/2017   by dr Carlean Purl   DIRECT LARYNGOSCOPY  1986   left vocal fold bx   (non-cancerous granuloma)   FINGER SURGERY Left 2017   ligament and tendon repair left index finger   HEMORRHOID SURGERY N/A 03/15/2022   Procedure: ANAL EXAM UNDER ANESTHESIA WITH PROCTOSCOPY  AND DEBRIDEMENT;  Surgeon: Leighton Ruff, MD;  Location: [facility];  Service: General;  Laterality: N/A;   INGUINAL HERNIA REPAIR Right 05/25/2018   Procedure: RIGHT INGUINAL HERNIA REPAIR WITH MESH;  Surgeon: Jovita Kussmaul, MD;   Location: [facility];  Service: General;  Laterality: Right;   INSERTION OF MESH Right 05/25/2018   Procedure: INSERTION OF MESH;  Surgeon: Jovita Kussmaul, MD;  Location: [facility];  Service: General;  Laterality: Right;   KNEE ARTHROSCOPY W/ MENISCAL REPAIR Right 10/26/2005   '@[facility]'$   by Dr  Wynelle Link   LAPAROSCOPIC APPENDECTOMY  03/22/2011   '@[facility]'$    LAPAROSCOPY N/A 03/20/2022   Procedure: DIAGNOSTIC LAPAROSCOPY; ABDOMINAL [place] OUT AND DRAIN PLACEMENT; CREATION OF DIVERTING COLOSTOMY; ANAL EXAM UNDER ANESTHESIA;  Surgeon: Leighton Ruff,  MD;  Location: [facility];  Service: General;  Laterality: N/A;   MOHS SURGERY  05/2021   left lower leg   TONSILLECTOMY  1958   TRANSANAL HEMORRHOIDAL DEARTERIALIZATION N/A 03/05/2022   Procedure: TRANSANAL HEMORRHOIDAL DEARTERIALIZATION;  Surgeon: Leighton Ruff, MD;  Location: [facility];  Service: General;  Laterality: N/A;    Social History   Socioeconomic History   Marital status: Single    Spouse name: Not on file   Number of children: Not on file   Years of education: Not on file   Highest education level: Not on file  Occupational History   Occupation: Professor    Employer: [employer]  Tobacco Use   Smoking status: Former    Years: 20.00    Types: Cigarettes    Quit date: 1990    Years since quitting: 33.4   Smokeless tobacco: Never  Vaping Use   Vaping Use: Never used  Substance and Sexual Activity   Alcohol use: Yes    Alcohol/week: 28.0 standard drinks    Types: 28 Standard drinks or equivalent per week    Comment: 4 cocktails daily   Drug use: Never   Sexual activity: Yes    Partners: Male  Other Topics Concern   Not on file  Social History Narrative   Patient reports he is single 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  Male partners   Former smoker   No substance abuse   Alcohol 2/day   Social Determinants of Radio broadcast assistant  Strain: Not on file  Food Insecurity: Not on file  Transportation Needs: Not on file  Physical Activity: Not on file  Stress: Not on file  Social Connections: Not on file  Intimate Partner Violence: Not on file    Family History  Problem Relation Age of Onset   AAA (abdominal aortic aneurysm) Mother    Glaucoma Sister    Stomach cancer Paternal Grandfather    Colon cancer Neg Hx     Current Facility-Administered Medications  Medication Dose Route Frequency Provider Last Rate Last Admin   (feeding supplement) PROSource Plus liquid 30 mL  30 mL Oral TID PC & HS Leighton Ruff, MD   30 mL at 04/03/22 1759   0.9 %  sodium chloride infusion   Intravenous PRN Leighton Ruff, MD 10 mL/hr at 04/01/22 1200 Infusion Verify at 04/01/22 1200   acetaminophen (TYLENOL) tablet 1,000 mg  1,000 mg Oral Q6H Michael Boston, MD       albuterol (PROVENTIL) (2.5 MG/3ML) 0.083% nebulizer solution 3 mL  3 mL Nebulization D1V PRN Leighton Ruff, MD   3 mL at 61/60/73 0548   alum & mag hydroxide-simeth (MAALOX/MYLANTA) 200-200-20 MG/5ML suspension 30 mL  30 mL Oral Q6H PRN Michael Boston, MD       amLODipine (NORVASC) tablet 5 mg  5 mg Oral q AM Leighton Ruff, MD   5 mg at 71/06/26 1004   azelastine (ASTELIN) 0.1 % nasal spray 1 spray  1 spray Each Nare BID PRN Leighton Ruff, MD   1 spray at 03/31/22 1648   chlorhexidine (PERIDEX) 0.12 % solution 15 mL  15 mL Mouth Rinse BID Leighton Ruff, MD   15 mL at 04/03/22 1005   Chlorhexidine Gluconate Cloth 2 % PADS 6 each  6 each Topical [id number] Leighton Ruff, MD   6 each at 04/03/22 1659   diphenhydrAMINE (BENADRYL) injection 12.5-25 mg  12.5-25 mg Intravenous Q6H  PRN Leighton Ruff, MD   25 mg at 04/01/22 2345   enalaprilat (VASOTEC) injection 0.625-1.25 mg  0.625-1.25 mg Intravenous Q6H PRN Michael Boston, MD       famotidine (PEPCID) tablet 20 mg  20 mg Oral BID Leighton Ruff, MD   20 mg at 04/03/22 1004   feeding supplement (ENSURE ENLIVE / ENSURE PLUS) liquid 237  mL  237 mL Oral TID BM Leighton Ruff, MD   242 mL at 04/03/22 1006   ferric gluconate (FERRLECIT) 125 mg in sodium chloride 0.9 % 100 mL IVPB  125 mg Intravenous Once Michael Boston, MD       gadobutrol (GADAVIST) 1 MMOL/ML injection 9 mL  9 mL Intravenous Once PRN Leighton Ruff, MD       HYDROmorphone (DILAUDID) injection 0.5-1 mg  0.5-1 mg Intravenous Q2H PRN Michael Boston, MD       ibuprofen (ADVIL) tablet 400-800 mg  400-800 mg Oral Q8H PRN Michael Boston, MD   400 mg at 04/03/22 1759   lactated ringers bolus 1,000 mL  1,000 mL Intravenous Q8H PRN Michael Boston, MD       lip balm (CARMEX) ointment 1 application.  1 application. Topical BID Leighton Ruff, MD   1 application. at 04/03/22 1006   LORazepam (ATIVAN) injection 0.5 mg  0.5 mg Intravenous Q8H PRN Michael Boston, MD       LORazepam (ATIVAN) tablet 0.5-1 mg  0.5-1 mg Oral Q8H PRN Michael Boston, MD       magic mouthwash  15 mL Oral QID PRN Leighton Ruff, MD       MEDLINE mouth rinse  15 mL Mouth Rinse [id number] Leighton Ruff, MD   15 mL at 04/03/22 1800   menthol-cetylpyridinium (CEPACOL) lozenge 3 mg  1 lozenge Oral PRN Leighton Ruff, MD   3 mg at 62/22/97 1423   methocarbamol (ROBAXIN) 1,000 mg in dextrose 5 % 100 mL IVPB  1,000 mg Intravenous Q6H PRN Michael Boston, MD       methocarbamol (ROBAXIN) tablet 1,000 mg  1,000 mg Oral Q6H PRN Michael Boston, MD       metoprolol tartrate (LOPRESSOR) tablet 50 mg  50 mg Oral BID Michael Boston, MD       micafungin [facility]) 100 mg in sodium chloride 0.9 % 100 mL IVPB  100 mg Intravenous Q24H Mignon [place], DO 110 mL/hr at 04/03/22 1457 100 mg at 04/03/22 1457   multivitamin with minerals tablet 1 tablet  1 tablet Oral Daily Leighton Ruff, MD   1 tablet at 04/03/22 1004   ondansetron (ZOFRAN) injection 4 mg  4 mg Intravenous L8X PRN Leighton Ruff, MD   4 mg at 21/19/41 1925   Or   ondansetron (ZOFRAN) 8 mg in sodium chloride 0.9 % 50 mL IVPB  8 mg Intravenous D4Y PRN Leighton Ruff, MD        ondansetron (ZOFRAN-ODT) disintegrating tablet 4 mg  4 mg Oral C1K PRN Leighton Ruff, MD       oxyCODONE (Oxy IR/ROXICODONE) immediate release tablet 5-10 mg  5-10 mg Oral G8J PRN Leighton Ruff, MD       pantoprazole (PROTONIX) EC tablet 40 mg  40 mg Oral Daily Leighton Ruff, MD   40 mg at 04/03/22 1004   phenol (CHLORASEPTIC) mouth spray 2 spray  2 spray Mouth/Throat PRN Michael Boston, MD       piperacillin-tazobactam (ZOSYN) IVPB 3.375 g  3.375 g Intravenous E5U Thomas, Alicia, MD 31.4 mL/hr at 04/03/22 1805  3.375 g at 04/03/22 1805   polycarbophil (FIBERCON) tablet 625 mg  625 mg Oral BID Michael Boston, MD       prochlorperazine (COMPAZINE) injection 5-10 mg  5-10 mg Intravenous Q4H PRN Michael Boston, MD       simethicone (MYLICON) 40 SN/0.5LZ suspension 80 mg  80 mg Oral QID PRN Leighton Ruff, MD   80 mg at 03/24/22 2209   sodium chloride flush (NS) 0.9 % injection 10-40 mL  10-40 mL Intracatheter J67H Leighton Ruff, MD   10 mL at 04/02/22 2135   sodium chloride flush (NS) 0.9 % injection 10-40 mL  10-40 mL Intracatheter PRN Leighton Ruff, MD   10 mL at 03/25/22 2138   traZODone (DESYREL) tablet 50 mg  50 mg Oral QHS PRN Leighton Ruff, MD   50 mg at 04/02/22 2151     Allergies  Allergen Reactions   Fluconazole Hives, Rash and Other (See Comments)    Shingles activated    Griseofulvin Anaphylaxis, Swelling, Rash and Other (See Comments)    Throat swelling     Penicillins Rash and Other (See Comments)    High fever, tolerates Cefepime   Sulfa Antibiotics Other (See Comments)    Joints ache, swell and caused inflammation    Mometasone Furo-Formoterol Fum Other (See Comments)    Lack of therapeutic effect    Oxycodone Nausea Only and Other (See Comments)    Nauseous to the point of almost vomiting   Retapamulin Rash    BP 122/84 (BP Location: Right Arm)   Pulse 100   Temp 99.6 F (37.6 C) (Oral)   Resp 17   Ht '5\' 9"'$  (1.753 m)   Wt 88.6 kg   SpO2 98%   BMI 28.84  kg/m   CT PELVIS W CONTRAST  Addendum Date: 03/19/2022   ADDENDUM REPORT: 03/19/2022 41:93 ADDENDUM: Dr. Leighton Ruff is operating in the OR, per her Nurse who contacted me by telephone at 0900 hours. I related the salient findings to her Nurse, and left my cell phone number for Dr. Marcello Moores to call me and discuss further as needed. Electronically Signed   By: Genevie Ann M.D.   On: 03/19/2022 09:20   Result Date: 03/19/2022 CLINICAL DATA:  71 year old male postoperative day 4 status post debridement and drainage of anal canal necrosis, right gluteal abscess, pelvic infection. Sepsis. EXAM: CT PELVIS WITH CONTRAST TECHNIQUE: Multidetector CT imaging of the pelvis was performed using the standard protocol following the bolus administration of intravenous contrast. RADIATION DOSE REDUCTION: This exam was performed according to the departmental dose-optimization program which includes automated exposure control, adjustment of the mA and/or kV according to patient size and/or use of iterative reconstruction technique. CONTRAST:  165m OMNIPAQUE IOHEXOL 300 MG/ML  SOLN COMPARISON:  CT Abdomen and Pelvis 03/14/2022 and earlier. FINDINGS: Urinary Tract: Visible kidneys appears stable. There is mild left hydroureter surrounded by inflammation and ongoing abnormal soft tissue gas. Urinary bladder is decompressed by Foley catheter. Abundant abnormal space of Retzius gas. Bowel: Ongoing abnormal rectum with appearance suspicious for focal perforation on series 4, image 61, abundant abnormal perirectal gas and inflammation. Abundant presacral gas and inflammation tracking cephalad into the retroperitoneum of the lower abdomen greater on the left. Will issue anal soft tissue gas right greater than left, tracking into the right inferior buttock on series 4, image 93. Associated organizing air in fluid collection situated along the left lateral conal fascia and pre renal fascia (series 4 images 1 through 14. This  component of  developing abscess is estimated at 40 mL volume. And additional bulky gas and fluid tracking along the left pelvic side wall appears somewhat contiguous (series 4, image 48) and increased since 03/14/2022. Throughout the lower abdomen small and large bowel loops are fluid-filled and mildly dilated. There is a small volume of free intraperitoneal air visible at the top of the scan in the abdominal cavity on the left series 4, image 1. Vascular/Lymphatic: Grossly patent visible major arterial structures with some iliofemoral atherosclerosis. No discrete lymphadenopathy. Reproductive: Gas and fluid tracking into the left inguinal canal has increased on series 4, image 63. Urethral catheter in place. Diffuse scrotal edema on series 4, image 116. No scrotal gas. Other: No layering free fluid in the lower abdomen or pelvis. Generalized subcutaneous edema at both flanks. Musculoskeletal: Degenerative changes in the visible lower spine. No acute osseous abnormality identified. But infectious appearing soft tissue gas and stranding now tracks distally along the left sciatic neurovascular bundle into the left thigh a distance of at least 12 cm (series 4, image 108 and series 602, image 76). IMPRESSION: 1. CT appearance compatible with Progressive Necrotizing Infection affecting multiple deep spaces of the lower abdomen and pelvis, and now tracking into the left lower extremity now along the sciatic neurovascular bundle. Organizing left retroperitoneal (pararenal space, lateral conal fascia) and left pelvic sidewall Abscesses (series 4, image 14). Abundant presacral and space of Retzius gas, and suspicion of ongoing dehiscence of the left lateral rectal wall (series 4, image 61). 2. Diffuse scrotal edema.  No scrotal gas. 3. Mildly dilated small and large bowel loops throughout the lower abdomen and pelvis, suggesting ileus. Electronically Signed: By: Genevie Ann M.D. On: 03/19/2022 08:45   CT ABDOMEN PELVIS W CONTRAST  Result  Date: 03/28/2022 CLINICAL DATA:  Abdominal pain, recent diverting colostomy. EXAM: CT ABDOMEN AND PELVIS WITH CONTRAST TECHNIQUE: Multidetector CT imaging of the abdomen and pelvis was performed using the standard protocol following bolus administration of intravenous contrast. RADIATION DOSE REDUCTION: This exam was performed according to the departmental dose-optimization program which includes automated exposure control, adjustment of the mA and/or kV according to patient size and/or use of iterative reconstruction technique. CONTRAST:  174m OMNIPAQUE IOHEXOL 300 MG/ML  SOLN COMPARISON:  03/26/2022 FINDINGS: Lower chest: Small bilateral pleural effusions and dependent lower lobe atelectasis, left greater than right. Stable a cardiomegaly. Hepatobiliary: No focal liver abnormality is seen. No gallstones, gallbladder wall thickening, or biliary dilatation. Pancreas: Unremarkable. No pancreatic ductal dilatation or surrounding inflammatory changes. Spleen: Normal in size without focal abnormality. Adrenals/Urinary Tract: Kidneys enhance normally and symmetrically. The adrenals are unremarkable. Bladder is decompressed with a Foley catheter. Stomach/Bowel: Left lower quadrant diverting colostomy is again identified, with mild distal colonic wall thickening just proximal to the colostomy site again noted. No evidence of bowel obstruction or ileus. Vascular/Lymphatic: Stable aortic atherosclerosis. Multiple subcentimeter lymph nodes in the mesentery are likely reactive. No pathologic adenopathy. Reproductive: Prostate is unremarkable. Other: Persistent inflammatory changes in the perirectal and presacral tissues, with soft tissue gas seen in the presacral space and extending along the bilateral pelvic sidewalls unchanged. There is a surgical drain in the presacral space via a right gluteal approach unchanged. There are 3 additional surgical drains. Most inferior drain is within the lower anterior abdominal wall within  the rectus musculature, with no surrounding fluid collection. Two other drains entering the peritoneal space view of the right lower quadrant and left lower quadrant are coiled within the lower pelvis.  The drains are unchanged in appearance. Inflammatory changes are again seen within the left lower quadrant mesentery. A stable gas and fluid collection anterior to the left kidney is again identified, measuring approximately 4.8 x 4.0 x 7.2 cm, slightly decreased in size since prior study. This is likely contiguous with the area drained by the left lower quadrant surgical drain. Musculoskeletal: No acute or destructive bony lesions. Intramuscular gas/fluid collections are seen within the medial and posterior proximal left thigh, concerning for intramuscular abscesses. Largest collection within the hamstring musculature proximal left thigh image 106/2 measures 5.1 x 3.1 cm. Reconstructed images demonstrate no additional findings. IMPRESSION: 1. Persistent gas and fluid collection anterior to the left kidney, likely contiguous with the area drain by the left lower quadrant surgical drain. Slight interval decrease in size since prior study. 2. Extensive inflammatory changes within the presacral space and along the pelvic sidewall, with numerous surgical drains as above, without significant change since prior study. 3. Interval progression of intramuscular fluid collections within the medial and posterior musculature of the proximal left thigh, concerning for intramuscular abscesses. Largest collection within the left hamstring musculature as above. 4. Left lower quadrant colostomy, with persistent wall thickening of the distal colon just proximal to the colostomy site. This may be secondary inflammatory change due to the intraperitoneal process, versus primary colitis. 5. Bilateral pleural effusions and dependent atelectasis, left greater than right, with slight progression since prior study. 6.  Aortic Atherosclerosis  (ICD10-I70.0). Electronically Signed   By: Randa Ngo M.D.   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 RADIATION DOSE REDUCTION: This exam was performed according to the departmental dose-optimization program which includes automated exposure control, adjustment of the mA and/or kV according to patient size and/or use of iterative reconstruction technique. CONTRAST:  181m OMNIPAQUE IOHEXOL 300 MG/ML  SOLN COMPARISON:  Pelvis CT 03/19/2022 FINDINGS: Lower chest: There is some dependent collapse/consolidative opacity in the lower lobes, left greater than right with small left and tiny right pleural effusions. Hepatobiliary: No suspicious focal abnormality within the liver parenchyma. There is no evidence for gallstones, gallbladder wall thickening, or pericholecystic fluid. No intrahepatic or extrahepatic biliary dilation. Pancreas: No focal mass lesion. No dilatation of the main duct. No intraparenchymal cyst. No peripancreatic edema. Spleen: No splenomegaly. No focal mass lesion. Adrenals/Urinary Tract: No adrenal nodule or mass. Kidneys unremarkable. No evidence for hydroureter. Bladder is decompressed by Foley catheter. Gas in the bladder lumen is compatible with the instrumentation. Stomach/Bowel: Stomach is unremarkable. No gastric wall thickening. No evidence of outlet obstruction. Duodenum is normally positioned as is the ligament of Treitz. No small bowel wall thickening. No small bowel dilatation. The terminal ileum is normal. The appendix is not well visualized, but there is no edema or inflammation in the region of the cecum. Right  and transverse colon unremarkable. Left colon and sigmoid colon are decompressed with left abdominal sigmoid colostomy evident. The wall of the descending and sigmoid colon is irregular, likely secondary to the adjacent inflammatory changes (see below). Vascular/Lymphatic: There is mild atherosclerotic calcification of the abdominal aorta without aneurysm. Portal vein, superior mesenteric vein, and splenic vein are patent. Celiac axis and SMA are patent.  There is no gastrohepatic or hepatoduodenal ligament lymphadenopathy. No retroperitoneal or mesenteric lymphadenopathy. No pelvic sidewall lymphadenopathy. Reproductive: The prostate gland and seminal vesicles are unremarkable. Other: There is extensive edema/inflammation and soft tissue gas in the pelvic floor presacral space. Surgical drain noted posterior to the rectum and appears to track laterally and anteriorly to the left, potentially through the reported defect of the anal wall described in the surgical note (axial image 90/2 and coronal 64/4). Gas and fluid tracks up in the presacral space and a second surgical drain entering from the anterolateral right lower abdominal wall is position within the pre sacral abscess cavity. A second more inferiorly placed right lower quadrant surgical drain is positioned in the left rectus sheath. A third drain entering from the left lower quadrant is positioned in the left paracolic gutter. Gas and edema tracks in the extraperitoneal tissues from the presacral space cranially into the retroperitoneal abdomen extending up along the left anterior pararenal fascia and posterior peritoneum. 5.3 x 3.5 x 7.2 cm collection of gas and fluid is identified in the left upper quadrant, anterior to the kidney and medial to the spleen. This tracks down to the peritoneal thickening along the left paracolic gutter but is unclear by CT whether this will be decompressed by the left paracolic gutter drain. Musculoskeletal: No worrisome lytic  or sclerotic osseous abnormality. Extensive subcutaneous edema is identified in the lower abdominal and pelvic sidewall. Intermuscular fluid/edema is identified in both thighs, left greater than right with fluid tracking along the left adductor muscles and intermuscular fluid in the compartments of the anterior left thigh. Imaging was carried down to a position just caudal to the femoral neck to the level of the proximal femoral diaphyses. Several tiny gas bubbles are seen in the fluid associated with the anterior left thigh muscles (see image 93/2 and 92/2). IMPRESSION: 1. There is extensive edema/inflammation and soft tissue gas in the pelvic floor and presacral space. Surgical drain noted posterior to the rectum and appears to track laterally and anteriorly to the left, potentially through the reported defect of the anal wall described in the surgical note. The fluid and gas tracks cranially in the retroperitoneal space of the abdomen and laterally and to the left along the anterior pararenal space and posterior peritoneum towards the paracolic gutter. 2. 5.3 x 3.5 x 7.2 cm collection of gas and fluid in the left upper quadrant, anterior to the kidney and medial to the spleen. This tracks down to the peritoneal thickening along the left paracolic gutter but is unclear by CT whether this will be decompressed by the left paracolic gutter drain. 3. Intermuscular fluid/edema is identified the region of both hips and proximal thigh bilaterally, left greater than right with fluid tracking along the left adductor muscles and intermuscular fluid in the compartments of the anterior left thigh. Several tiny gas bubbles are seen in the fluid associated with the anterior left thigh muscles. Imaging appearance raises concern for cellulitis/myositis. 4. Small left and tiny right pleural effusions with dependent collapse/consolidative opacity in the lower lobes, left greater than right. 5. Left abdominal sigmoid colostomy.  Descending and sigmoid colon shows ill definition of the wall and mural edema, potentially secondary to the inflammatory process in the pelvis and left abdomen. 6. Aortic Atherosclerosis (ICD10-I70.0). Electronically Signed   By: Misty Stanley M.D.   On: 03/26/2022 10:27   CT ABDOMEN PELVIS W CONTRAST  Result Date: 03/14/2022 CLINICAL DATA:  Acute abdominal pain.  Pelvic pain in male.  EXAM: CT ABDOMEN AND PELVIS WITH CONTRAST TECHNIQUE: Multidetector CT imaging of the abdomen and pelvis was performed using the standard protocol following bolus administration of intravenous contrast. RADIATION DOSE REDUCTION: This exam was performed according to the departmental dose-optimization program which includes automated exposure control, adjustment of the mA and/or kV according to patient size and/or use of iterative reconstruction technique. CONTRAST:  132m OMNIPAQUE IOHEXOL 300 MG/ML  SOLN COMPARISON:  CT 5 days ago 03/09/2022 FINDINGS: Lower chest: Central line tip in the lower SVC. Small bilateral pleural effusions and adjacent atelectasis. Hepatobiliary: Punctate hepatic granuloma. No focal liver lesion. Layering hyperdensity in the gallbladder not seen on prior likely representing vicarious excretion of contrast. No calcified gallstone. Pancreas: No ductal dilatation or inflammation. Spleen: Normal in size without focal abnormality. Adrenals/Urinary Tract: Normal adrenal glands. No hydronephrosis or perinephric edema. Homogeneous renal enhancement with symmetric excretion on delayed phase imaging. No renal calculi. Foley catheter decompresses the urinary bladder. Stomach/Bowel: Significant increase in the extraluminal gas from prior exam. There is patchy free air in the pelvis involving the extraperitoneal spaces, presacral space, left anterior preperitoneal space and tracking adjacent to the sigmoid colon. Soft tissue gas tracks anterior to the urinary bladder. Air tracks into a left inguinal hernia. Patchy soft  tissue gas tracks cranially in the retroperitoneum adjacent to the mesenteric vessels to the level of the iliac confluence. There is large amount of extraluminal gas involving the left pericolic gutter and anterior pararenal space. Free air tracks into the upper abdomen where it is seen anterior to the liver. Source of air likely represents rectal perforation or there is rectal wall thickening and edema. Air in the anterior rectal wall, series 2, image 102. There is generalized increase in stranding and ill-defined fluid associated with this extraluminal air. There is no peripherally enhancing fluid collection. Small hiatal hernia. Decompressed stomach. No small bowel obstruction or inflammation. High-riding cecum in the right mid abdomen. Appendix is not seen. Administered enteric contrast is seen to the level of the sigmoid colon. There is diverticulosis of the distal descending and sigmoid colon but no focally inflamed diverticulum. Vascular/Lymphatic: Aortic atherosclerosis. No aneurysm. No evidence of mesenteric venous thrombus. The portal vein is patent. No bulky adenopathy. Reproductive: Unremarkable prostate. Other: Patchy increased an extraperitoneal gas as described. Left inguinal hernia contains air and small amount of non organized free fluid. There is edema within the subcutaneous soft tissues. Musculoskeletal: Scoliosis and vacuum phenomenon in the lumbar spine. There are no acute or suspicious osseous abnormalities. IMPRESSION: 1. Significant increase in extraluminal extraperitoneal gas in the abdomen and pelvis from CT 5 days ago as described above. Source of air likely represents rectal perforation. There is generalized increase in stranding and ill-defined fluid associated with this extraluminal air. No peripherally enhancing fluid collection. 2. Left inguinal hernia contains tracking air and small amount of non organized free fluid. 3. Distal colonic diverticulosis without acute inflammation. 4.  Small bilateral pleural effusions and adjacent atelectasis. Aortic Atherosclerosis (ICD10-I70.0). These results will be called to the ordering clinician or representative by the Radiologist Assistant, and communication documented in the PACS or [employer] Electronically Signed   By: MKeith RakeM.D.   On: 03/14/2022 15:52   CT ABDOMEN PELVIS W CONTRAST  Result Date: 03/09/2022 CLINICAL DATA:  Fever abdominal pain and bloating. History of transient hemorrhoidal Dearterialization on April 21 EXAM: CT ABDOMEN AND PELVIS WITH CONTRAST TECHNIQUE: Multidetector CT imaging of the abdomen and pelvis was performed using the standard protocol following bolus  administration of intravenous contrast. RADIATION DOSE REDUCTION: This exam was performed according to the departmental dose-optimization program which includes automated exposure control, adjustment of the mA and/or kV according to patient size and/or use of iterative reconstruction technique. CONTRAST:  163m OMNIPAQUE IOHEXOL 300 MG/ML  SOLN COMPARISON:  CT Mar 22, 2011 FINDINGS: Lower chest: Small bilateral pleural effusions with bibasilar atelectasis. Hepatobiliary: No suspicious hepatic lesion. Gallbladder is unremarkable. No biliary ductal dilation. Pancreas: No pancreatic ductal dilation or evidence of acute inflammation. Spleen: No splenomegaly or focal splenic lesion. Adrenals/Urinary Tract: Bilateral adrenal glands appear normal. No hydronephrosis. Kidneys demonstrate symmetric enhancement and excretion of contrast material. Urinary bladder is unremarkable for degree of distension. Stomach/Bowel: No radiopaque enteric contrast material was administered. Stomach is unremarkable for degree of distension. No pathologic dilation of small or large bowel. Colonic diverticulosis. Rectal wall thickening with gas tracking into the anterior rectal wall on sagittal image 95/6. Presacral fluid with stranding and gas in the mesorectal fat. Vascular/Lymphatic:  Aortic atherosclerosis without abdominal aortic aneurysm. Reproductive: Prostate is unremarkable. Other: Small volume retroperitoneal and mesenteric free fluid. No walled off fluid collection. Musculoskeletal: Dextroconvex curvature of the thoracolumbar spine with associated degenerative change. 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 IMPRESSION: 1. Rectal wall thickening with gas tracking into the anterior rectal wall, presacral fluid as well as stranding and gas in the mesorectal fat, findings most consistent with rectal perforation. 2. Small volume retroperitoneal and mesenteric free fluid. No walled off fluid collection. 3. Small bilateral pleural effusions with bibasilar atelectasis. 4.  Aortic Atherosclerosis (ICD10-I70.0). Electronically Signed   By: JDahlia BailiffM.D.   On: 03/09/2022 16:15   Tyler FEMUR LEFT WO CONTRAST  Result Date: 03/29/2022 CLINICAL DATA:  History of hemorrhoid surgery and now has left thigh pain from hip to the knee. EXAM: Tyler OF THE LEFT FEMUR WITHOUT CONTRAST TECHNIQUE: Multiplanar, multisequence Tyler imaging of the left femur was performed. No intravenous contrast was administered. COMPARISON:  None Available. FINDINGS: Incomplete examination, patient was unable to tolerate and complete examination. Only axial T1 and coronal T1 and STIR images were obtained. Bones/Joint/Cartilage No appreciable fracture of the partially imaged left femur. No appreciable joint effusion. Degenerative changes of the left hip joint without evidence of significant joint effusion. Muscles and Tendons Muscles are normal in bulk and signal. No intramuscular hematoma or significant atrophy. Soft tissues Skin and subcutaneous soft tissues are within normal limits. IMPRESSION: Limited examination of the left femur. Within the above-mentioned limitations no acute osseous or soft tissue abnormality. Electronically Signed   By: IKeane PoliceD.O.   On: 03/29/2022 21:59   DG CHEST PORT 1  VIEW  Result Date: 03/28/2022 CLINICAL DATA:  Fever EXAM: PORTABLE CHEST 1 VIEW COMPARISON:  03/27/2022 FINDINGS: Unchanged AP portable examination with small, layering left pleural effusion. Cardiomegaly. Right upper extremity PICC. IMPRESSION: Unchanged AP portable examination with small, layering left pleural effusion. Cardiomegaly. Electronically Signed   By: ADelanna AhmadiM.D.   On: 03/28/2022 10:34   DG Chest Portable 1 View  Result Date: 03/27/2022 CLINICAL DATA:  Cough and fever. EXAM: PORTABLE CHEST 1 VIEW COMPARISON:  Mar 19, 2022 FINDINGS: There is stable right-sided PICC line positioning. The cardiac silhouette is mildly enlarged and unchanged in size. Decreased lung volumes are seen. Mild atelectasis and/or infiltrate is noted within the left lung base. A very small left pleural effusion is suspected. No pneumothorax is identified. The visualized skeletal structures are unremarkable. IMPRESSION: Mild left basilar atelectasis and/or  infiltrate with a very small left pleural effusion. Electronically Signed   By: Virgina Norfolk M.D.   On: 03/27/2022 01:14   DG CHEST PORT 1 VIEW  Result Date: 03/19/2022 CLINICAL DATA:  Sepsis. EXAM: PORTABLE CHEST 1 VIEW COMPARISON:  PA and lateral 01/15/2019, lung base images from abdomen pelvis CT 03/09/2022. FINDINGS: There is mild cardiomegaly, increased central vascular prominence, evidence of early interstitial edema in the base of the lungs and small pleural effusions. There is hazy opacity in both lower lung fields which could indicate ground-glass edema, pneumonitis, atelectasis or combination. The mid and upper lung fields are clear.  There is no pneumothorax. Stable mediastinum with mild aortic tortuosity and atherosclerosis. Mild osteopenia. IMPRESSION: Cardiomegaly with mild perihilar vascular congestion, early findings of interstitial edema in the lung bases and small pleural effusions. There are hazy opacities in the lung bases which could be  atelectasis, ground-glass edema, pneumonitis or combination. Clinical correlation and radiographic follow-up recommended. Electronically Signed   By: Telford Nab M.D.   On: 03/19/2022 07:39   DG Abd 2 Views  Result Date: 03/17/2022 CLINICAL DATA:  Abdominal pain EXAM: ABDOMEN - 2 VIEW COMPARISON:  CT done on 03/14/2022 FINDINGS: Bowel gas pattern is nonspecific. Extraluminal air seen in the pelvis could not be distinctly identified in the radiographs. There is no definite pneumothorax in the upright radiograph. There is infiltrate in the left lower lung fields suggesting atelectasis/pneumonia. Part of this finding may suggest pleural effusion. There is dextroscoliosis in the lumbar spine. Degenerative changes are noted with bony spurs and facet hypertrophy in the lumbar spine. Degenerative changes are noted in the left hip. IMPRESSION: Nonspecific bowel gas pattern. Increased density in the left lower lung fields suggests pleural effusion and infiltrates. Electronically Signed   By: Elmer Picker M.D.   On: 03/17/2022 16:22   [place] LT LOWER EXTREM LTD SOFT TISSUE NON VASCULAR  Result Date: 03/30/2022 CLINICAL DATA:  71 year old male with history of recent diverting colostomy status post hemorrhoid embolization procedure with development of left lower extremity swelling and possible fluid collections. EXAM: ULTRASOUND LEFT LOWER EXTREMITY LIMITED TECHNIQUE: Ultrasound examination of the lower extremity soft tissues was performed in the area of clinical concern. COMPARISON:  03/28/2022, 03/29/2022 FINDINGS: Within the mid to distal posterolateral thigh musculature are multifocal, irregularly-shaped heterogeneously hypoechoic structures without significant internal vascularity. Given morphologic irregularity, the fluid collections are difficult to measure in entirety. IMPRESSION: Multifocal, irregularly-shaped mildly complex fluid collections within the mid to distal left posterolateral thigh musculature.  Favored to represent abscesses given findings on comparison studies. Ruthann Cancer, MD [facility] Electronically Signed   By: Ruthann Cancer M.D.   On: 03/30/2022 10:06   VAS [place] LOWER EXTREMITY VENOUS (DVT)  Result Date: 03/26/2022  Lower Venous DVT Study Patient Name:  PHELIX FUDALA  Date of Exam:   03/25/2022 Medical Rec #: [id number]          Accession #:    [id number] Date of Birth: [birth year]          Patient Gender: M Patient Age:   64 years Exam Location:  [facility] Procedure:      VAS [place] LOWER EXTREMITY VENOUS (DVT) Referring Phys: Leighton Ruff --------------------------------------------------------------------------------  Indications: Swelling.  Risk Factors: None identified. Limitations: Patient pain tolerance. Comparison Study: 03-24-2022 Bilateral upper and lower extremity venous studies                   were negative  for DVT. Performing Technologist: Darlin Coco RDMS, RVT  Examination Guidelines: A complete evaluation includes B-mode imaging, spectral Doppler, color Doppler, and power Doppler as needed of all accessible portions of each vessel. Bilateral testing is considered an integral part of a complete examination. Limited examinations for reoccurring indications may be performed as noted. The reflux portion of the exam is performed with the patient in reverse Trendelenburg.  +-----+---------------+---------+-----------+----------+--------------+ RIGHTCompressibilityPhasicitySpontaneityPropertiesThrombus Aging +-----+---------------+---------+-----------+----------+--------------+ CFV  Full           Yes      Yes                                 +-----+---------------+---------+-----------+----------+--------------+   +---------+---------------+---------+-----------+----------+--------------+ LEFT     CompressibilityPhasicitySpontaneityPropertiesThrombus Aging  +---------+---------------+---------+-----------+----------+--------------+ CFV      Full           Yes      Yes                                 +---------+---------------+---------+-----------+----------+--------------+ SFJ      Full                                                        +---------+---------------+---------+-----------+----------+--------------+ FV Prox  Full                                                        +---------+---------------+---------+-----------+----------+--------------+ FV Mid   Full                                                        +---------+---------------+---------+-----------+----------+--------------+ FV DistalFull                                                        +---------+---------------+---------+-----------+----------+--------------+ PFV      Full                                                        +---------+---------------+---------+-----------+----------+--------------+ POP      Full           Yes      Yes                                 +---------+---------------+---------+-----------+----------+--------------+ PTV      Full                                                        +---------+---------------+---------+-----------+----------+--------------+  PERO     Full                                                        +---------+---------------+---------+-----------+----------+--------------+ Gastroc  Full                                                        +---------+---------------+---------+-----------+----------+--------------+     Summary: RIGHT: - No evidence of deep vein thrombosis in the lower extremity. No indirect evidence of obstruction proximal to the inguinal ligament.  LEFT: - Findings appear essentially unchanged compared to previous examination performed yesterday.  - There is no evidence of deep vein thrombosis in the lower extremity.  - No cystic structure  found in the popliteal fossa.  *See table(s) above for measurements and observations. Electronically signed by Monica Martinez MD on 03/26/2022 at 1:31:02 PM.    Final    VAS [place] LOWER EXTREMITY VENOUS (DVT)  Result Date: 03/24/2022  Lower Venous DVT Study Patient Name:  AMRITPAL SHROPSHIRE  Date of Exam:   03/24/2022 Medical Rec #: [id number]          Accession #:    [id number] Date of Birth: [birth year]          Patient Gender: M Patient Age:   35 years Exam Location:  [facility] Procedure:      VAS [place] LOWER EXTREMITY VENOUS (DVT) Referring Phys: CHI ELLISON --------------------------------------------------------------------------------  Indications: Swelling.  Risk Factors: None identified. Limitations: Poor ultrasound/tissue interface and patient positioning, patient pain tolerance. Comparison Study: No prior studies. Performing Technologist: Oliver Hum RVT  Examination Guidelines: A complete evaluation includes B-mode imaging, spectral Doppler, color Doppler, and power Doppler as needed of all accessible portions of each vessel. Bilateral testing is considered an integral part of a complete examination. Limited examinations for reoccurring indications may be performed as noted. The reflux portion of the exam is performed with the patient in reverse Trendelenburg.  +---------+---------------+---------+-----------+----------+--------------+ RIGHT    CompressibilityPhasicitySpontaneityPropertiesThrombus Aging +---------+---------------+---------+-----------+----------+--------------+ CFV      Full           Yes      Yes                                 +---------+---------------+---------+-----------+----------+--------------+ SFJ      Full                                                        +---------+---------------+---------+-----------+----------+--------------+ FV Prox  Full                                                         +---------+---------------+---------+-----------+----------+--------------+ FV Mid   Full                                                        +---------+---------------+---------+-----------+----------+--------------+  FV DistalFull                                                        +---------+---------------+---------+-----------+----------+--------------+ PFV      Full                                                        +---------+---------------+---------+-----------+----------+--------------+ POP      Full           Yes      Yes                                 +---------+---------------+---------+-----------+----------+--------------+ PTV      Full                                                        +---------+---------------+---------+-----------+----------+--------------+ PERO     Full                                                        +---------+---------------+---------+-----------+----------+--------------+   +---------+---------------+---------+-----------+----------+-------------------+ LEFT     CompressibilityPhasicitySpontaneityPropertiesThrombus Aging      +---------+---------------+---------+-----------+----------+-------------------+ CFV                     Yes      Yes                                      +---------+---------------+---------+-----------+----------+-------------------+ FV Prox                 Yes      Yes                                      +---------+---------------+---------+-----------+----------+-------------------+ FV Mid                  Yes      Yes                                      +---------+---------------+---------+-----------+----------+-------------------+ FV Distal               Yes      Yes                                      +---------+---------------+---------+-----------+----------+-------------------+ PFV  Not well  visualized +---------+---------------+---------+-----------+----------+-------------------+ POP      Full           Yes      Yes                                      +---------+---------------+---------+-----------+----------+-------------------+ PTV      Full                                                             +---------+---------------+---------+-----------+----------+-------------------+ PERO     Full                                                             +---------+---------------+---------+-----------+----------+-------------------+     Summary: RIGHT: - There is no evidence of deep vein thrombosis in the lower extremity. However, portions of this examination were limited- see technologist comments above.  - No cystic structure found in the popliteal fossa.  LEFT: - There is no evidence of deep vein thrombosis in the lower extremity. However, portions of this examination were limited- see technologist comments above.  - No cystic structure found in the popliteal fossa.  *See table(s) above for measurements and observations. Electronically signed by Orlie Pollen on 03/24/2022 at 9:19:55 PM.    Final    VAS [place] UPPER EXTREMITY VENOUS DUPLEX  Result Date: 03/24/2022 UPPER VENOUS STUDY  Patient Name:  VIHAAN GLOSS  Date of Exam:   03/24/2022 Medical Rec #: [id number]          Accession #:    [id number] Date of Birth: 08/06/1951          Patient Gender: M Patient Age:   83 years Exam Location:  [facility] Procedure:      VAS [place] UPPER EXTREMITY VENOUS DUPLEX Referring Phys: CHI ELLISON --------------------------------------------------------------------------------  Indications: Swelling Risk Factors: None identified. Limitations: Poor ultrasound/tissue interface, bandages, line and patient positioning, patient pain tolerance. Comparison Study: No prior studies. Performing Technologist: Oliver Hum RVT  Examination Guidelines: A complete evaluation includes B-mode  imaging, spectral Doppler, color Doppler, and power Doppler as needed of all accessible portions of each vessel. Bilateral testing is considered an integral part of a complete examination. Limited examinations for reoccurring indications may be performed as noted.  Right Findings: +----------+------------+---------+-----------+----------+-------+ RIGHT     CompressiblePhasicitySpontaneousPropertiesSummary +----------+------------+---------+-----------+----------+-------+ IJV           Full       Yes       Yes                      +----------+------------+---------+-----------+----------+-------+ Subclavian    Full       Yes       Yes                      +----------+------------+---------+-----------+----------+-------+ Axillary      Full       Yes       Yes                      +----------+------------+---------+-----------+----------+-------+  Brachial      Full       Yes       Yes                      +----------+------------+---------+-----------+----------+-------+ Radial        Full                                          +----------+------------+---------+-----------+----------+-------+ Ulnar         Full                                          +----------+------------+---------+-----------+----------+-------+ Cephalic      Full                                          +----------+------------+---------+-----------+----------+-------+ Basilic       Full                                          +----------+------------+---------+-----------+----------+-------+  Left Findings: +----------+------------+---------+-----------+----------+-------+ LEFT      CompressiblePhasicitySpontaneousPropertiesSummary +----------+------------+---------+-----------+----------+-------+ IJV           Full       Yes       Yes                      +----------+------------+---------+-----------+----------+-------+ Subclavian    Full       Yes       Yes                       +----------+------------+---------+-----------+----------+-------+ Axillary      Full       Yes       Yes                      +----------+------------+---------+-----------+----------+-------+ Brachial      Full       Yes       Yes                      +----------+------------+---------+-----------+----------+-------+ Radial        Full                                          +----------+------------+---------+-----------+----------+-------+ Ulnar         Full                                          +----------+------------+---------+-----------+----------+-------+ Cephalic      None                                   Acute  +----------+------------+---------+-----------+----------+-------+ Basilic       Full                                          +----------+------------+---------+-----------+----------+-------+  Summary:  Right: No evidence of deep vein thrombosis in the upper extremity. No evidence of superficial vein thrombosis in the upper extremity.  Left: No evidence of deep vein thrombosis in the upper extremity. Findings consistent with acute superficial vein thrombosis involving the left cephalic vein.  *See table(s) above for measurements and observations.  Diagnosing physician: Orlie Pollen Electronically signed by Orlie Pollen on 03/24/2022 at 9:16:30 PM.    Final    [place] EKG SITE RITE  Result Date: 03/12/2022 If Site Rite image not attached, placement could not be confirmed due to current cardiac rhythm.  [place] FINE NEEDLE ASP 1ST LESION  Result Date: 03/31/2022 INDICATION: 71 year old male with history of recent diverting colostomy status post hemorrhoid embolization procedure with development of left lower extremity intramuscular fluid collections concerning for abscess. EXAM: Ultrasound-guided aspiration of left lower extremity fluid collection MEDICATIONS: The patient is currently admitted to the hospital and receiving  intravenous antibiotics. The antibiotics were administered within an appropriate time frame prior to the initiation of the procedure. ANESTHESIA/SEDATION: None. COMPLICATIONS: None immediate. PROCEDURE: Informed written consent was obtained from the patient after a thorough discussion of the procedural risks, benefits and alternatives. All questions were addressed. Maximal Sterile Barrier Technique was utilized including caps, mask, sterile gowns, sterile gloves, sterile drape, hand hygiene and skin antiseptic. A timeout was performed prior to the initiation of the procedure. Preprocedure ultrasound evaluation of the left lower extremity was performed. Within the intramuscular aspect of the posterior left thigh in the mid to distal portions are phlegmonous changes with a few interspersed simple appearing fluid collections. The procedure was planned. The left lower extremity is prepped and draped in standard fashion. Subdermal Local anesthesia was administered at the planned needle entry site with 1% lidocaine. Under direct ultrasound visualization, a 5 [place], 7 cm Yueh catheter was directed into the fluid collection. Aspiration was performed which yielded approximately 10 mL of purulent fluid. Samples were sent for culture. The needle was removed. Postprocedure ultrasound of it patient demonstrated no evidence of hematoma or other complicating features. A bandage was applied. The patient tolerated the procedure well was transferred back to the ICU in stable condition. IMPRESSION: Technically successful ultrasound-guided aspiration of left posterior thigh intramuscular abscess yielding approximately 10 mL of purulent fluid. Ruthann Cancer, MD [facility] Radiology Electronically Signed   By: Ruthann Cancer M.D.   On: 03/31/2022 08:07    Note:  This dictation was prepared with Dragon/digital dictation along with [employer]. Any transcriptional errors that  result from this process are unintentional.   .Adin Hector, M.D., F.A.C.S. Gastrointestinal and Minimally Invasive Surgery [facility] [address], [place],  [address] [phone] Main / Paging  04/03/2022 7:14 PM

## 2022-04-04 DIAGNOSIS — E876 Hypokalemia: Secondary | ICD-10-CM

## 2022-04-04 LAB — COMPREHENSIVE METABOLIC PANEL
ALT: 55 U/L — ABNORMAL HIGH (ref 0–44)
AST: 51 U/L — ABNORMAL HIGH (ref 15–41)
Albumin: 1.9 g/dL — ABNORMAL LOW (ref 3.5–5.0)
Alkaline Phosphatase: 436 U/L — ABNORMAL HIGH (ref 38–126)
Anion gap: 6 (ref 5–15)
BUN: 24 mg/dL — ABNORMAL HIGH (ref 8–23)
CO2: 21 mmol/L — ABNORMAL LOW (ref 22–32)
Calcium: 7.7 mg/dL — ABNORMAL LOW (ref 8.9–10.3)
Chloride: 107 mmol/L (ref 98–111)
Creatinine, Ser: 0.91 mg/dL (ref 0.61–1.24)
GFR, Estimated: >60 mL/min (ref >60)
Glucose, Bld: 113 mg/dL — ABNORMAL HIGH (ref 70–99)
Potassium: 3.6 mmol/L (ref 3.5–5.1)
Sodium: 134 mmol/L — ABNORMAL LOW (ref 135–145)
Total Bilirubin: 0.8 mg/dL (ref 0.3–1.2)
Total Protein: 6.3 g/dL — ABNORMAL LOW (ref 6.5–8.1)

## 2022-04-04 LAB — CBC WITH DIFFERENTIAL/PLATELET
Abs Immature Granulocytes: 0.03 10*3/uL (ref 0.00–0.07)
Basophils Absolute: 0 10*3/uL (ref 0.0–0.1)
Basophils Relative: 1 %
Eosinophils Absolute: 0 10*3/uL (ref 0.0–0.5)
Eosinophils Relative: 0 %
HCT: 25.8 % — ABNORMAL LOW (ref 39.0–52.0)
Hemoglobin: 8.7 g/dL — ABNORMAL LOW (ref 13.0–17.0)
Immature Granulocytes: 0 %
Lymphocytes Relative: 28 %
Lymphs Abs: 1.9 10*3/uL (ref 0.7–4.0)
MCH: 31.4 pg (ref 26.0–34.0)
MCHC: 33.7 g/dL (ref 30.0–36.0)
MCV: 93.1 fL (ref 80.0–100.0)
Monocytes Absolute: 0.3 10*3/uL (ref 0.1–1.0)
Monocytes Relative: 5 %
Neutro Abs: 4.4 10*3/uL (ref 1.7–7.7)
Neutrophils Relative %: 66 %
Platelets: 417 10*3/uL — ABNORMAL HIGH (ref 150–400)
RBC: 2.77 MIL/uL — ABNORMAL LOW (ref 4.22–5.81)
RDW: 13.9 % (ref 11.5–15.5)
WBC: 6.7 10*3/uL (ref 4.0–10.5)
nRBC: 0 % (ref 0.0–0.2)

## 2022-04-04 LAB — BASIC METABOLIC PANEL
Anion gap: 6 (ref 5–15)
BUN: 25 mg/dL — ABNORMAL HIGH (ref 8–23)
CO2: 24 mmol/L (ref 22–32)
Calcium: 8.2 mg/dL — ABNORMAL LOW (ref 8.9–10.3)
Chloride: 108 mmol/L (ref 98–111)
Creatinine, Ser: 0.93 mg/dL (ref 0.61–1.24)
GFR, Estimated: >60 mL/min (ref >60)
Glucose, Bld: 98 mg/dL (ref 70–99)
Potassium: 3.5 mmol/L (ref 3.5–5.1)
Sodium: 138 mmol/L (ref 135–145)

## 2022-04-04 LAB — AEROBIC/ANAEROBIC CULTURE W GRAM STAIN (SURGICAL/DEEP WOUND)

## 2022-04-04 LAB — PHOSPHORUS: Phosphorus: 3.9 mg/dL (ref 2.5–4.6)

## 2022-04-04 LAB — MAGNESIUM: Magnesium: 2 mg/dL (ref 1.7–2.4)

## 2022-04-04 MED ORDER — METOPROLOL TARTRATE 25 MG PO TABS: 25.0000 mg | ORAL_TABLET | Freq: Two times a day (BID) | ORAL | Status: DC

## 2022-04-04 MED ORDER — ASCORBIC ACID 500 MG PO TABS
500.0000 mg | ORAL_TABLET | Freq: Two times a day (BID) | ORAL | Status: DC
  Administered 2022-04-04 – 2022-04-09 (×11): 500 mg via ORAL
  Filled 2022-04-04 (×11): qty 1

## 2022-04-04 MED ORDER — ENOXAPARIN SODIUM 40 MG/0.4ML IJ SOSY
40.0000 mg | PREFILLED_SYRINGE | INTRAMUSCULAR | Status: DC
  Administered 2022-04-04 – 2022-04-09 (×6): 40 mg via SUBCUTANEOUS
  Filled 2022-04-04 (×6): qty 0.4

## 2022-04-04 MED ORDER — LOPERAMIDE HCL 2 MG PO CAPS
2.0000 mg | ORAL_CAPSULE | Freq: Once | ORAL | Status: AC
Start: 2022-04-04 — End: 2022-04-04
  Administered 2022-04-04: 2 mg via ORAL
  Filled 2022-04-04: qty 1

## 2022-04-04 MED ORDER — NYSTATIN 100000 UNIT/GM EX POWD
Freq: Two times a day (BID) | CUTANEOUS | Status: AC
  Filled 2022-04-04: qty 15

## 2022-04-04 MED ORDER — TRAMADOL HCL 50 MG PO TABS
50.0000 mg | ORAL_TABLET | Freq: Four times a day (QID) | ORAL | Status: DC | PRN
  Administered 2022-04-04 (×2): 50 mg via ORAL
  Administered 2022-04-05 – 2022-04-06 (×2): 100 mg via ORAL
  Administered 2022-04-07: 50 mg via ORAL
  Filled 2022-04-04 (×3): qty 1
  Filled 2022-04-04 (×2): qty 2

## 2022-04-04 MED ORDER — METOPROLOL TARTRATE 12.5 MG HALF TABLET
12.5000 mg | ORAL_TABLET | Freq: Two times a day (BID) | ORAL | Status: DC
  Filled 2022-04-04: qty 1

## 2022-04-04 MED ORDER — LORAZEPAM 0.5 MG PO TABS: 0.5000 mg | ORAL_TABLET | Freq: Two times a day (BID) | ORAL | Status: DC | PRN

## 2022-04-04 MED ORDER — METOPROLOL TARTRATE 12.5 MG HALF TABLET
12.5000 mg | ORAL_TABLET | Freq: Two times a day (BID) | ORAL | Status: DC
  Administered 2022-04-04 – 2022-04-06 (×5): 12.5 mg via ORAL
  Filled 2022-04-04 (×5): qty 1

## 2022-04-04 MED ORDER — FERROUS SULFATE 325 (65 FE) MG PO TABS
325.0000 mg | ORAL_TABLET | Freq: Two times a day (BID) | ORAL | Status: DC
  Administered 2022-04-04 – 2022-04-09 (×10): 325 mg via ORAL
  Filled 2022-04-04 (×10): qty 1

## 2022-04-04 MED ORDER — POTASSIUM CHLORIDE CRYS ER 20 MEQ PO TBCR
40.0000 meq | EXTENDED_RELEASE_TABLET | Freq: Every day | ORAL | Status: AC
  Administered 2022-04-04 – 2022-04-06 (×3): 40 meq via ORAL
  Filled 2022-04-04 (×3): qty 2

## 2022-04-04 MED ORDER — LACTATED RINGERS IV BOLUS
1000.0000 mL | Freq: Once | INTRAVENOUS | Status: AC
  Administered 2022-04-04: 1000 mL via INTRAVENOUS

## 2022-04-04 MED ORDER — LORAZEPAM 2 MG/ML IJ SOLN: 0.5000 mg | Freq: Two times a day (BID) | INTRAMUSCULAR | Status: DC | PRN

## 2022-04-04 MED ORDER — METHOCARBAMOL 500 MG PO TABS
500.0000 mg | ORAL_TABLET | Freq: Four times a day (QID) | ORAL | Status: DC
  Administered 2022-04-04 – 2022-04-05 (×8): 500 mg via ORAL
  Filled 2022-04-04 (×8): qty 1

## 2022-04-04 MED ORDER — METOPROLOL TARTRATE 5 MG/5ML IV SOLN
5.0000 mg | Freq: Four times a day (QID) | INTRAVENOUS | Status: DC | PRN
  Administered 2022-04-04: 5 mg via INTRAVENOUS
  Filled 2022-04-04: qty 5

## 2022-04-04 NOTE — Progress Notes (Addendum)
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  Presented to [facility] ER on 4/25 with nausea, fever, and abdominal pain.  Repeat CT abdomen/pelvis showed possible perforation.  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 He was taken to the OR on 5/06 and had diagnostic laparotomy, abdominal wash out, drain placement and diverting colostomy.  5/14 PCCM reconsulted for tachycardia and possible sepsis.  MRI Lt femur negative for abscess but ultrasound positive for fluid collection aspirated by IR   OT comments  OT/PT co-treat today for safety as patient known to have orthostatic issues. Overall improved mobility and activity tolerance to tolerate donning of underwear, standing and ambulating with chair follow. Patient reported being dizzy with ambulation but BP not significantly low. He exhibited improved affect and motivation. Continue to recommend to AIR at discharge. Will work on standing ADLs and activity tolerance at next session now that he has progressed to mostly one assist.    Recommendations for follow up therapy are one component of a multi-disciplinary discharge planning process, led by the attending physician.  Recommendations may be updated based on patient status, additional functional criteria and insurance authorization.    Follow Up Recommendations  Acute inpatient rehab (3hours/day)    Assistance Recommended at Discharge Frequent or constant Supervision/Assistance  Patient can return home with the following  A lot of help with bathing/dressing/bathroom;Assistance with cooking/housework;Direct supervision/assist for financial management;Direct supervision/assist for medications management;Help with stairs or ramp for entrance;A little help  with walking and/or transfers   Equipment Recommendations  BSC/3in1;Tub/shower seat    Recommendations for Other Services      Precautions / Restrictions Precautions Precautions: Fall Precaution Comments: 3 drains, colostomy, rectal drainage Restrictions Weight Bearing Restrictions: No       Mobility Bed Mobility                    Transfers                         Balance Overall balance assessment: Needs assistance Sitting-balance support: No upper extremity supported, Feet supported Sitting balance-Leahy Scale: Fair     Standing balance support: During functional activity, Reliant on assistive device for balance Standing balance-Leahy Scale: Poor Standing balance comment: reliant on walker                           ADL either performed or assessed with clinical judgement   ADL Overall ADL's : Needs assistance/impaired                     Lower Body Dressing: Total assistance;Sit to/from stand Lower Body Dressing Details (indicate cue type and reason): Total assist to don underwear and perform sit to stand             Functional mobility during ADLs: Min guard;+2 for safety/equipment General ADL Comments: Min guard and verbal cues to log roll out of bed. Min guard and elevated bed height to stand with walker. Drainage from rectum noted with standing. Returned to seated position to provide clean up. Total assist to don mesh underwear and place pad. Patient min guard to stand from bed a second time and then twice from  recliner during functional mobility. Patient able to perform two bouts of ambulation with one rest break and encouragement. Patient reported feeling a little dizzy with ambulation but BP not significantly low.    Extremity/Trunk Assessment              Vision Baseline Vision/History: 1 Wears glasses     Perception     Praxis      Cognition Arousal/Alertness: Awake/alert Behavior During Therapy: WFL  for tasks assessed/performed Overall Cognitive Status: Within Functional Limits for tasks assessed                                          Exercises      Shoulder Instructions       General Comments      Pertinent Vitals/ Pain       Pain Assessment Pain Assessment: Faces Faces Pain Scale: Hurts little more Pain Location: Colostomy site Pain Descriptors / Indicators: Grimacing, Tender  Home Living                                          Prior Functioning/Environment              Frequency  Min 2X/week        Progress Toward Goals  OT Goals(current goals can now be found in the care plan section)  Progress towards OT goals: Progressing toward goals  Acute Rehab OT Goals Patient Stated Goal: have mroe strength OT Goal Formulation: With patient Time For Goal Achievement: 04/14/22 Potential to Achieve Goals: Good  Plan Discharge plan remains appropriate    Co-evaluation    PT/OT/SLP Co-Evaluation/Treatment: Yes     OT goals addressed during session: ADL's and self-care (activity tolerance)      AM-PAC OT "6 Clicks" Daily Activity     Outcome Measure   Help from another person eating meals?: None Help from another person taking care of personal grooming?: A Little Help from another person toileting, which includes using toliet, bedpan, or urinal?: Total Help from another person bathing (including washing, rinsing, drying)?: A Lot Help from another person to put on and taking off regular upper body clothing?: A Lot Help from another person to put on and taking off regular lower body clothing?: Total 6 Click Score: 13    End of Session Equipment Utilized During Treatment: Rolling walker (2 wheels)  OT Visit Diagnosis: Muscle weakness (generalized) (M62.81);Pain   Activity Tolerance Patient tolerated treatment well   Patient Left in chair;with call bell/phone within reach;with nursing/sitter in room   Nurse  Communication Mobility status        Time: 2353-6144 OT Time Calculation (min): 29 min  Charges: OT General Charges $OT Visit: 1 Visit OT Treatments $Therapeutic Activity: 8-22 mins  Derl Barrow, OTR/L [place]  Office [phone] Pager: [place] 04/04/2022, 3:45 PM

## 2022-04-04 NOTE — Progress Notes (Signed)
Physical Therapy Treatment Patient Details Name: Tyler [name] MRN: [id number] DOB: 1951/05/29 Today's Date: 04/04/2022   History of Present Illness 71 yo male former smoker had transanal hemorrhoidal dearterialization on 4/21.  Presented to [facility] ER on 4/25 with nausea, fever, and abdominal pain.  Repeat CT abdomen/pelvis showed possible perforation.  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 He was taken to the OR on 5/06 and had diagnostic laparotomy, abdominal wash out, drain placement and diverting colostomy.  5/14 PCCM reconsulted for tachycardia and possible sepsis.  MRI Lt femur negative for abscess but ultrasound positive for fluid collection aspirated by IR    PT Comments    Pt agreeable to working with therapies. Mod A +2 for safety/equipment on today. He took 2 short walks with the RW. Distance limited by fatigue, weakness, and some pain. BP WNL when assessed after first walk but pt did c/o dizziness. O2 99% on RA, dyspnea 2/4 with activity. Encouraged pt to sit in recliner as tolerated.     Recommendations for follow up therapy are one component of a multi-disciplinary discharge planning process, led by the attending physician.  Recommendations may be updated based on patient status, additional functional criteria and insurance authorization.  Follow Up Recommendations  Acute inpatient rehab (3hours/day)     Assistance Recommended at Discharge Frequent or constant Supervision/Assistance  Patient can return home with the following A lot of help with walking and/or transfers;A lot of help with bathing/dressing/bathroom;Assistance with cooking/housework;Direct supervision/assist for medications management;Assist for transportation;Help with stairs or ramp for entrance   Equipment Recommendations       Recommendations for Other Services Rehab consult     Precautions / Restrictions Precautions Precautions:  Fall Precaution Comments: 3 drains, colostomy, rectal drainage Restrictions Weight Bearing Restrictions: No     Mobility  Bed Mobility Overal bed mobility: Needs Assistance Bed Mobility: Rolling, Sidelying to Sit Rolling: Min assist, +2 for safety/equipment Sidelying to sit: +2 for safety/equipment, HOB elevated, Mod assist       General bed mobility comments: Min assist with pt initiating roll to Rt side and Mod Assist to press up trunk and control lowering to bring LE's off EOB    Transfers Overall transfer level: Needs assistance Equipment used: Rolling walker (2 wheels) Transfers: Sit to/from Stand Sit to Stand: Min assist, +2 physical assistance, +2 safety/equipment, From elevated surface           General transfer comment: Assist to power up, stabilize, control descent. Cues for safety, technique, hand placement. Increased time. Task is effortful for pt.    Ambulation/Gait Ambulation/Gait assistance: Min assist, +2 safety/equipment Gait Distance (Feet): 5 Feet (x2) Assistive device: Rolling walker (2 wheels) Gait Pattern/deviations: Step-to pattern, Decreased step length - right, Decreased step length - left       General Gait Details: Assist to stabilize pt throughout short distance. Followed closely with recliner and used it to transport pt back to room. Seated rest break between walks. Cues for safety, posture, breathing. Provided some encouragement to progress distance as well.   Stairs             Wheelchair Mobility    Modified Rankin (Stroke Patients Only)       Balance Overall balance assessment: Needs assistance         Standing balance support: During functional activity, Reliant on assistive device for balance, Bilateral upper extremity supported Standing balance-Leahy  Scale: Poor                              Cognition Arousal/Alertness: Awake/alert Behavior During Therapy: WFL for tasks assessed/performed Overall  Cognitive Status: Within Functional Limits for tasks assessed                                          Exercises      General Comments        Pertinent Vitals/Pain Pain Assessment Pain Assessment: Faces Faces Pain Scale: Hurts little more Pain Location: Colostomy site Pain Descriptors / Indicators: Grimacing, Tender Pain Intervention(s): Monitored during session, Limited activity within patient's tolerance, Repositioned    Home Living                          Prior Function            PT Goals (current goals can now be found in the care plan section) Progress towards PT goals: Progressing toward goals    Frequency    Min 4X/week      PT Plan Current plan remains appropriate    Co-evaluation       OT goals addressed during session: ADL's and self-care (activity tolerance)      AM-PAC PT "6 Clicks" Mobility   Outcome Measure  Help needed turning from your back to your side while in a flat bed without using bedrails?: A Lot Help needed moving from lying on your back to sitting on the side of a flat bed without using bedrails?: A Lot Help needed moving to and from a bed to a chair (including a wheelchair)?: A Lot Help needed standing up from a chair using your arms (e.g., wheelchair or bedside chair)?: A Lot Help needed to walk in hospital room?: A Lot Help needed climbing 3-5 steps with a railing? : Total 6 Click Score: 11    End of Session Equipment Utilized During Treatment: Gait belt Activity Tolerance: Patient limited by fatigue;Patient limited by pain Patient left: in chair;with call bell/phone within reach;with nursing/sitter in room   PT Visit Diagnosis: Muscle weakness (generalized) (M62.81);Difficulty in walking, not elsewhere classified (R26.2);Unsteadiness on feet (R26.81)     Time: 0947-0962 PT Time Calculation (min) (ACUTE ONLY): 29 min  Charges:  $Gait Training: 8-22 mins                        Doreatha Massed,  PT Acute Rehabilitation  Office: [phone] Pager: [phone]

## 2022-04-04 NOTE — Progress Notes (Signed)
Orthostatic completed this AM with the help of night shift RN. Both nightshift RN and I were  able to transfer pt from bed to chair after obtaining orthostatic vitals. Pt c/o feeling very weak upon standing. Decrease in bp and elevation in HR noted upon standing placing pt in a red MEWS. Surgeon on unit and made aware of events. New order placed.  Colostomy changed by Dr. Johney Maine, Surgeon , when he rounded this AM. Will continue to care for pt.

## 2022-04-04 NOTE — Consult Note (Signed)
[place] Nurse ostomy follow up Noted are events of the morning and conversation that Dr. Johney Maine had with the patient's partner last evening. Noted that Dr. Johney Maine replaced patient's pouch this morning. Discussed POC with Dr. Johney Maine today via [place].  This Probation officer will plan to see patient tomorrow; Bedside RN V. Jimmye Norman and I have communicated via [place] and I have asked her to see if the patient's partner can participate in a teaching session tomorrow am. She will notify me via Secure Chat if this is possible.  If not possible to meet with patient's partner tomorrow, my visit will proceed as planned as a teaching session with the patient's partner can certainly occur at another time. Discharge is not imminent and while early to be finalized, the immediate post discharge care setting does not appear to be home (CIR was recommended by PT on 5/17 and Rehab Admissions has screened and indicated that he is a potential candidate for CIR).   [place] nursing team will continue to follow, and will remain available to this patient, the nursing and medical teams.    Thank you for allowing [place] to participate in this patient's POC.  Maudie Flakes, MSN, RN, [place], [facility]  Pager# [phone]

## 2022-04-04 NOTE — Progress Notes (Addendum)
Tyler [name] [id number] 28-Jan-1951  CARE TEAM:  PCP: Cari Caraway, MD  Outpatient Care Team: Patient Care Team: Cari Caraway, MD as PCP - General (Family Medicine) Leighton Ruff, MD as Consulting Physician (General Surgery) Gatha Mayer, MD as Consulting Physician (Gastroenterology)  Inpatient Treatment Team: Treatment Team: Attending Provider: Leighton Ruff, MD; [place] Nurse: McNichol, Rudi Heap, RN; Consulting Physician: Ileana Roup, MD; Occupational Therapist: Lavon Paganini, OT; Consulting Physician: Jabier Mutton, MD; Registered Nurse: Early Osmond, RN; Registered Nurse: Louanne Skye, RN; Registered Nurse: Danie Chandler, RN; Technician: Celene Kras, NT; Utilization Review: Claudie Leach, RN; Occupational Therapist: Lenward Chancellor, OT   Problem List:   Principal Problem:   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 Active Problems:   Chronic rhinitis   Asthmatic bronchitis   Gastroesophageal reflux disease   Hypertension   Eczema   Prolapsed internal hemorrhoids, grade 3, THD ligation 03/07/2022   Abdominal pain   Acute urinary retention   History of tobacco use   Normal anion gap metabolic acidosis   Acute blood loss anemia (ABLA)   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   Protein-calorie malnutrition, severe ([place])   Anxiety associated with depression   Intra-abdominal abscess ([place])   Thigh abscess   Post-Op    03/05/2022 POST-OPERATIVE DIAGNOSIS:  GRADE 3 HEMORRHOIDS   PROCEDURE:  TRANSANAL HEMORRHOIDAL DEARTERIALIZATION   Surgeon(s): Leighton Ruff, MD  [birth year] POST-OPERATIVE DIAGNOSIS:  Anal canal necrosis R gluteal abscess   PROCEDURE:  ANAL EXAM UNDER ANESTHESIA WITH DEBRIDEMENT, INCISION AND DRAINAGE OF ANAL CANAL, PROCTOSCOPY  Surgeon(s): Leighton Ruff, MD  OR findings 5/1: Pt with necrosis of his anoderm posteriorly on EUA, large R gluteal abscess- opened and drained  CT 5/5 shows  significant retroperitoneal contamination.    03/20/2022 POST-OPERATIVE DIAGNOSIS:  * No post-op diagnosis entered *   PROCEDURE:  DIAGNOSTIC LAPAROSCOPY; ABDOMINAL Bee OUT AND DRAIN PLACEMENT; CREATION OF DIVERTING COLOSTOMY; ANAL EXAM UNDER ANESTHESIA   Surgeon(s): Leighton Ruff, MD  OR findings 5/6: significant RP infection, drained via L pericolic gutter, presacral infection, drained, anterior abd wall infection, drained.  CT 5/14: L RP fluid collection slightly small, L thigh fluid collection stable  IR aspiration left thigh abscess 5/16:  Cultures w Candida Albicans - micofungin started per ID 5/19-    Assessment  Anal necrosis after hemorrhoidal ligation and pexy complicated with pelvic sepsis and abscesses requiring operative drainage and fecal diversion.    Slowly improving but very deconditioned & malnourished  [facility] Stay = 25 days)  Plan:  -Solid diet with supplemental shakes to improve nutrition.  Weekly nutritional labs.  Check tomorrow.  Last albumin 1.5  -IV antibiotics.  Zosyn for feculent contamination.  Micofungin added 5/20 w Candida albicans and left thigh aspiration.  Per Dr. Marcello Moores and infectious disease.  Suspecting prolonged course given malnourished state and extremely complicated infection with multifocal abscesses.  Undrained left retroperitoneal Perry nephric loculated collection.  Continue IV antibiotics.  Repeat CT scan PRN per Dr. Marcello Moores & infectious disease.  HIV/influenza/COVID-negative.  -Continue drains.  Hopefully as output continues to go down & nutrition improves, we can gradually remove them in the next few weeks.  We will see.  Perhaps drain study down the pelvis to rule out fistulization  -Work with physical therapy and Occupational Therapy.  Canceled appointments - I believe pain and orthostasis have been barriers along with being very deconditioned.  Pain seems improved today.  Try and correct orthostasis.  Hopefully can gradually get him  more mobilized.    -Orthostatic with some hypotension with history of moderate hypertension on multiple medications.  Hold amlodipine.  Continue holding ACE inhibitor as well.  Back metoprolol to minimal doses with as needed.  IV fluid bolus and follow  -Acute postoperative anemia in the setting of anemia of chronic disease.  Hemoglobin improved to 8.7.  Received IV iron.  Continue oral iron with multivitamin/vitamin C follow.  Should gradually improve.  -Borderline potassium.  Will supplement for the next 2 days.  Magnesium fine.  Other electrolytes fine.  -VTE prophylaxis- SCDs - pt prefers TED.  Enoxaparin since patient not likely any procedures or surgeries anytime soon.  Follow hemoglobin and platelets expectantly  -Anxiolysis.  I think that is limited some of his testing and care.  He has been through a lot.  Concern of being oversedated earlier in hospitalization so slowly backing off as needed.  Oral or IV options.  Offered spiritual care/chaplaincy for some support.  -Improve pain control.  I think the patient was holding off and asking for any medications but it is limited his ability to mobilize and clearly is "uncomfortable" = in pain.  I think I convinced him to reconsider some mild standing medications and asked for more medication so he can tolerate getting out of bed and mobilize and do therapy.  Give him a chance to sleep.  He has nausea with oxycodone and also offered tramadol p.o.  Scheduled Tylenol and add low dose PO methocarbamol.  IV meds backup as well  -Colostomy care.  His loop ostomy somewhat contracted and leaking.  We will try and thicken his bowels and gently constipate him since he has some post ileus diarrhea.  I replaced a convex pouch.  I reached out to one of our wound ostomy nurse experts, Maudie Flakes, to see if she can help troubleshoot  -Keeping Foley catheter for acute urinary tension in the setting of pelvic sepsis.  Perhaps can do voiding trial in the  coming week if abscesses resolved and improved  I updated the patient's status to the patient and nurse noted the patient that I discussed with his husband and Dr. Brantley Stage had yesterday as well.  Patient noted his husband can "be a lot".  But the patient noted he can be as well.  I noted that it is a good sign that his husband wants the best for him and is just trying to be diligent.  Recommendations were made.  While the patient is severely deconditioned, I tried to focus on the fact that he is no longer in shock, he is out of the ICU, his bowel function has returned and he is weaned off IV nutrition, all current abscesses are drained or control with antibiotics.  Lesser guardedly good signs right now.  No new events or decline.  He agreed to some more aggressive pain control and working with rehab.  Noted this will take quite a long time and we will be diligent with multidisciplinary care.  Questions were answered.  They expressed understanding & appreciation.   Disposition:  Disposition:  The patient is from: Home  Anticipate discharge to:  Inpatient Rehab ([facility])  Anticipated Date of Discharge is:  June 2,2023    Barriers to discharge:  Pending Clinical improvement (more likely than not)  Patient currently is NOT MEDICALLY STABLE for discharge from the hospital from a surgery standpoint.      I reviewed nursing notes, Consultant Infective disease, PT,  pharmacy, notes, last 24 h vitals and pain scores, last 48 h intake and output, last 24 h labs and trends, and last 24 h imaging results. I have reviewed this patient's available data, including medical history, events of note, test results, etc as part of my evaluation.  A significant portion of that time was spent in counseling.  Care during the described time interval was provided by me.  This care required high  level of medical decision making.  04/04/2022    Subjective: (Chief complaint)  Patient's, 1 husband called and discussed  with Dr. Paula Libra at yesterday morning with myself yesterday evening.  See yesterday's note  Patient felt a little lightheaded and orthostatic with checking orthostasis today.  However less pain  Nursing involved in care and in room.  Objective:  Vital signs:  Vitals:   04/03/22 1458 04/03/22 2131 04/04/22 0722 04/04/22 0729  BP: 122/84 125/90 127/85 93/79  Pulse: 100 97 95 (!) 130  Resp: 17 18    Temp: 99.6 F (37.6 C) 98.3 F (36.8 C) 98.4 F (36.9 C)   TempSrc: Oral Oral Oral   SpO2: 98% 98% 96%   Weight:      Height:        Last BM Date : 04/04/22  Intake/Output   Yesterday:  05/20 0701 - 05/21 0700 In: 815 [P.O.:795] Out: 2050 [Urine:1850; Drains:200] This shift:  No intake/output data recorded.  Bowel function:  Flatus: YES  BM:  YES  Drain: Right-sided abdominal drains x2 & L abd drain x1 - all Purulent   Physical Exam:  General: Pt awake/alert in no acute distress.  Deconditioned and cachectic. Eyes: PERRL, normal EOM.  Sclera clear.  No icterus Neuro: CN II-XII intact w/o focal sensory/motor deficits. Lymph: No head/neck/groin lymphadenopathy Psych:  No delerium/psychosis/paranoia.  Mildly foggy/days but then perked up and is well oriented x 4 HENT: Normocephalic, Mucus membranes moist.  No thrush Neck: Supple, No tracheal deviation.  No obvious thyromegaly Chest: No pain to chest wall compression.  Good respiratory excursion.  No audible wheezing CV:  Pulses intact.  Regular rhythm.  No major extremity edema MS: Normal AROM mjr joints.  No obvious deformity  Abdomen: Soft.  Mildy distended.  Mildly tender at incisions only.  No evidence of peritonitis.  No incarcerated hernias. Colostomy on left side with frank copious liquid stool leaking.  I removed washed and replaced a convex pouch.  Ostomy pink.  Some retraction inferomedially.  Pfannenstiel incision mostly closed with mild separation.  Drain sites okay.  GU: Foley catheter in place with clear  light yellow-colored urine.  No hematuria or purulence.  Perineal wounds with minimal granulation and some drainage Ext:   No deformity.  No mjr edema.  No cyanosis Skin: No petechiae / purpurea.  No major sores.  Warm and dry    Results:   Cultures: Recent Results (from the past 720 hour(s))  Resp Panel by RT-PCR (Flu A&B, Covid) Nasopharyngeal Swab     Status: None   Collection Time: 03/09/22  3:00 PM   Specimen: Nasopharyngeal Swab; Nasopharyngeal(NP) swabs in vial transport medium  Result Value Ref Range Status   SARS Coronavirus 2 by RT PCR NEGATIVE NEGATIVE Final    Comment: (NOTE) SARS-CoV-2 target nucleic acids are NOT DETECTED.  The SARS-CoV-2 RNA is generally detectable in upper respiratory specimens during the acute phase of infection. The lowest concentration of SARS-CoV-2 viral copies this assay can detect is 138 copies/mL. A negative result does not preclude  SARS-Cov-2 infection and should not be used as the sole basis for treatment or other patient management decisions. A negative result may occur with  improper specimen collection/handling, submission of specimen other than nasopharyngeal swab, presence of viral mutation(s) within the areas targeted by this assay, and inadequate number of viral copies(<138 copies/mL). A negative result must be combined with clinical observations, patient history, and epidemiological information. The expected result is Negative.  Fact Sheet for Patients:  EntrepreneurPulse.com.au  Fact Sheet for Healthcare Providers:  IncredibleEmployment.be  This test is no t yet approved or cleared by the [place] FDA and  has been authorized for detection and/or diagnosis of SARS-CoV-2 by FDA under an Emergency Use Authorization (EUA). This EUA will remain  in effect (meaning this test can be used) for the duration of the COVID-19 declaration under Section 564(b)(1) of the Act, 21 U.S.C.section  360bbb-3(b)(1), unless the authorization is terminated  or revoked sooner.       Influenza A by PCR NEGATIVE NEGATIVE Final   Influenza B by PCR NEGATIVE NEGATIVE Final    Comment: (NOTE) The Xpert Xpress SARS-CoV-2/FLU/RSV plus assay is intended as an aid in the diagnosis of influenza from Nasopharyngeal swab specimens and should not be used as a sole basis for treatment. Nasal washings and aspirates are unacceptable for Xpert Xpress SARS-CoV-2/FLU/RSV testing.  Fact Sheet for Patients: EntrepreneurPulse.com.au  Fact Sheet for Healthcare Providers: IncredibleEmployment.be  This test is not yet approved or cleared by the [place] FDA and has been authorized for detection and/or diagnosis of SARS-CoV-2 by FDA under an Emergency Use Authorization (EUA). This EUA will remain in effect (meaning this test can be used) for the duration of the COVID-19 declaration under Section 564(b)(1) of the Act, 21 U.S.C. section 360bbb-3(b)(1), unless the authorization is terminated or revoked.  Performed at [facility] [place] [address], [place], [place] [address]   Surgical PCR screen     Status: None   Collection Time: 03/15/22 12:00 PM   Specimen: Nasal Mucosa; Nasal Swab  Result Value Ref Range Status   MRSA, PCR NEGATIVE NEGATIVE Final   Staphylococcus aureus NEGATIVE NEGATIVE Final    Comment: (NOTE) The Xpert SA Assay (FDA approved for NASAL specimens in patients 62 years of age and older), is one component of a comprehensive surveillance program. It is not intended to diagnose infection nor to guide or monitor treatment. Performed at [facility], [place] [address] [place], [place] [address]   MRSA Next Gen by PCR, Nasal     Status: None   Collection Time: 03/17/22  3:46 PM   Specimen: Nasal Mucosa; Nasal Swab  Result Value Ref Range Status   MRSA by PCR Next Gen NOT DETECTED NOT DETECTED Final     Comment: (NOTE) The GeneXpert MRSA Assay (FDA approved for NASAL specimens only), is one component of a comprehensive MRSA colonization surveillance program. It is not intended to diagnose MRSA infection nor to guide or monitor treatment for MRSA infections. Test performance is not FDA approved in patients less than 48 years old. Performed at [facility], [place] [address], [place], [place] [address]   Culture, blood (Routine X 2) w Reflex to ID Panel     Status: None   Collection Time: 03/17/22  4:11 PM   Specimen: BLOOD  Result Value Ref Range Status   Specimen Description   Final    BLOOD BLOOD LEFT ARM Performed at [address] [place], [place] [address]    Special Requests   Final    BOTTLES DRAWN AEROBIC ONLY Blood Culture results may not be optimal due to an inadequate volume of blood received in culture bottles Performed at [place] [address] [place], [place] [address]    Culture   Final    NO GROWTH 5 DAYS Performed at [facility], [place] [address], [place], [place] [address]    Report Status 03/22/2022 FINAL  Final  Culture, blood (Routine X 2) w Reflex to ID Panel     Status: None   Collection Time: 03/17/22  4:11 PM   Specimen: BLOOD  Result Value Ref Range Status   Specimen Description   Final    BLOOD BLOOD LEFT HAND Performed at [facility] [address] [place], [place] [address]    Special Requests   Final    BOTTLES DRAWN AEROBIC ONLY Blood Culture adequate volume Performed at [place] [facility] [address], [place], [place] [address]    Culture   Final    NO GROWTH 5 DAYS Performed at [facility], [place] [address] [place], [place] [address]    Report Status 03/22/2022 FINAL  Final  Aerobic/Anaerobic Culture w Gram Stain (surgical/deep wound)     Status: None   Collection Time: 03/20/22  8:44 AM   Specimen: PATH Soft  tissue  Result Value Ref Range Status   Specimen Description   Final    TISSUE PATH SOFT TISSUE Performed at [place] [address], [place], [place] [address]    Special Requests   Final    TISSUE Performed at [facility], [place] [address] [place], [place] [address]    Gram Stain   Final    NO SQUAMOUS EPITHELIAL CELLS SEEN MODERATE WBC SEEN ABUNDANT GRAM POSITIVE COCCI Performed at [facility], [place] [address], [place], [place] [address]    Culture   Final    FEW ESCHERICHIA COLI FEW ENTEROCOCCUS FAECALIS MIXED ANAEROBIC FLORA PRESENT.  CALL LAB IF FURTHER IID REQUIRED.    Report Status 03/25/2022 FINAL  Final   Organism ID, Bacteria ESCHERICHIA COLI  Final   Organism ID, Bacteria ENTEROCOCCUS FAECALIS  Final      Susceptibility   Escherichia coli - MIC*    AMPICILLIN >=32 RESISTANT Resistant     CEFAZOLIN <=4 SENSITIVE Sensitive     CEFEPIME <=0.12 SENSITIVE Sensitive     CEFTAZIDIME <=1 SENSITIVE Sensitive     CEFTRIAXONE <=0.25 SENSITIVE Sensitive     CIPROFLOXACIN <=0.25 SENSITIVE Sensitive     GENTAMICIN <=1 SENSITIVE Sensitive     IMIPENEM <=0.25 SENSITIVE Sensitive     TRIMETH/SULFA <=20 SENSITIVE Sensitive     AMPICILLIN/SULBACTAM 16 INTERMEDIATE Intermediate     PIP/TAZO <=4 SENSITIVE Sensitive     * FEW ESCHERICHIA COLI   Enterococcus faecalis - MIC*    AMPICILLIN <=2 SENSITIVE Sensitive     VANCOMYCIN 1 SENSITIVE Sensitive     GENTAMICIN SYNERGY SENSITIVE Sensitive     * FEW ENTEROCOCCUS FAECALIS  Culture, blood (Routine X 2) w Reflex to ID Panel     Status: None   Collection Time: 03/25/22 10:30 AM   Specimen: BLOOD  Result Value Ref Range Status   Specimen Description   Final    BLOOD LEFT ANTECUBITAL Performed at [facility] [address], [place], [place] [address]  Special Requests   Final    IN PEDIATRIC BOTTLE Blood Culture adequate volume Performed at [facility] [place] [address] [place], [place] [address]    Culture   Final    NO GROWTH 5 DAYS Performed at [facility], [place] [address] [place], [place] [address]    Report Status 03/30/2022 FINAL  Final  Culture, blood (Routine X 2) w Reflex to ID Panel     Status: None   Collection Time: 03/25/22 10:37 AM   Specimen: BLOOD LEFT HAND  Result Value Ref Range Status   Specimen Description   Final    BLOOD LEFT HAND Performed at [place] [address] [place], [place] [address]    Special Requests   Final    IN PEDIATRIC BOTTLE Blood Culture adequate volume Performed at [place] [address] [place], [place] [address]    Culture   Final    NO GROWTH 5 DAYS Performed at [facility], [place] [address], [place], [place] [address]    Report Status 03/30/2022 FINAL  Final  Aerobic/Anaerobic Culture w Gram Stain (surgical/deep wound)     Status: None (Preliminary result)   Collection Time: 03/30/22  5:00 PM   Specimen: Abscess  Result Value Ref Range Status   Specimen Description   Final    ABSCESS Performed at [place] [address], [place], [place] [address]    Special Requests LEFT LEG  Final   Gram Stain   Final    ABUNDANT WBC PRESENT, PREDOMINANTLY PMN NO ORGANISMS SEEN Performed at [facility], [place] [address] [place], [place] [address]    Culture   Final    RARE CANDIDA ALBICANS NO ANAEROBES ISOLATED; CULTURE IN PROGRESS FOR 5 DAYS    Report Status PENDING  Incomplete    Labs: Results for orders placed or performed during the hospital encounter of 03/09/22 (from the past 48 hour(s))  Magnesium     Status: None   Collection Time: 04/04/22  5:02 AM  Result Value Ref Range   Magnesium 2.0 1.7 - 2.4 mg/dL    Comment: Performed at [facility], [place] [address] [place], [place] [address]  Phosphorus     Status: None   Collection Time: 04/04/22   5:02 AM  Result Value Ref Range   Phosphorus 3.9 2.5 - 4.6 mg/dL    Comment: Performed at [facility], [place] [address], [place], [place] [address]  CBC with Differential/Platelet     Status: Abnormal   Collection Time: 04/04/22  5:02 AM  Result Value Ref Range   WBC 6.7 4.0 - 10.5 K/uL   RBC 2.77 (L) 4.22 - 5.81 MIL/uL   Hemoglobin 8.7 (L) 13.0 - 17.0 g/dL   HCT 25.8 (L) 39.0 - 52.0 %   MCV 93.1 80.0 - 100.0 fL   MCH 31.4 26.0 - 34.0 pg   MCHC 33.7 30.0 - 36.0 g/dL   RDW 13.9 11.5 - 15.5 %   Platelets 417 (H) 150 - 400 K/uL   nRBC 0.0 0.0 - 0.2 %   Neutrophils Relative % 66 %   Neutro Abs 4.4 1.7 - 7.7 K/uL   Lymphocytes Relative 28 %   Lymphs Abs 1.9 0.7 - 4.0 K/uL   Monocytes Relative 5 %   Monocytes Absolute 0.3 0.1 - 1.0 K/uL   Eosinophils Relative 0 %   Eosinophils Absolute 0.0  0.0 - 0.5 K/uL   Basophils Relative 1 %   Basophils Absolute 0.0 0.0 - 0.1 K/uL   Immature Granulocytes 0 %   Abs Immature Granulocytes 0.03 0.00 - 0.07 K/uL   Polychromasia PRESENT     Comment: Performed at [facility], [place] [address], [place], [place] [address]  Basic metabolic panel     Status: Abnormal   Collection Time: 04/04/22  5:02 AM  Result Value Ref Range   Sodium 138 135 - 145 mmol/L   Potassium 3.5 3.5 - 5.1 mmol/L   Chloride 108 98 - 111 mmol/L   CO2 24 22 - 32 mmol/L   Glucose, Bld 98 70 - 99 mg/dL    Comment: Glucose reference range applies only to samples taken after fasting for at least 8 hours.   BUN 25 (H) 8 - 23 mg/dL   Creatinine, Ser 0.93 0.61 - 1.24 mg/dL   Calcium 8.2 (L) 8.9 - 10.3 mg/dL   GFR, Estimated >60 >60 mL/min    Comment: (NOTE) Calculated using the CKD-EPI Creatinine Equation (2021)    Anion gap 6 5 - 15    Comment: Performed at [facility], [place] [address] [place], [place] [address]    Imaging / Studies: No results found.  Medications / Allergies: per chart  Antibiotics: Anti-infectives  (From admission, onward)    Start     Dose/Rate Route Frequency Ordered Stop   04/02/22 1400  micafungin (MYCAMINE) 100 mg in sodium chloride 0.9 % 100 mL IVPB        100 mg 110 mL/hr over 1 Hours Intravenous Every 24 hours 04/02/22 1224     03/29/22 1400  piperacillin-tazobactam (ZOSYN) IVPB 3.375 g        3.375 g 12.5 mL/hr over 240 Minutes Intravenous Every 8 hours 03/29/22 1231     03/28/22 1430  meropenem (MERREM) 1 g in sodium chloride 0.9 % 100 mL IVPB  Status:  Discontinued        1 g 200 mL/hr over 30 Minutes Intravenous Every 8 hours 03/28/22 1336 03/29/22 1231   03/25/22 1000  vancomycin (VANCOCIN) IVPB 1000 mg/200 mL premix  Status:  Discontinued        1,000 mg 200 mL/hr over 60 Minutes Intravenous Every 24 hours 03/24/22 0840 03/29/22 1231   03/24/22 2000  ceFAZolin (ANCEF) IVPB 2g/100 mL premix  Status:  Discontinued        2 g 200 mL/hr over 30 Minutes Intravenous Every 8 hours 03/24/22 1230 03/28/22 1326   03/24/22 1100  metroNIDAZOLE (FLAGYL) IVPB 500 mg  Status:  Discontinued        500 mg 100 mL/hr over 60 Minutes Intravenous Every 12 hours 03/24/22 0848 03/28/22 1326   03/24/22 1000  ceFAZolin (ANCEF) IVPB 2g/100 mL premix  Status:  Discontinued        2 g 200 mL/hr over 30 Minutes Intravenous Every 8 hours 03/24/22 0811 03/24/22 1230   03/24/22 0930  vancomycin (VANCOREADY) IVPB 1500 mg/300 mL        1,500 mg 150 mL/hr over 120 Minutes Intravenous  Once 03/24/22 0840 03/24/22 1147   03/22/22 2200  piperacillin-tazobactam (ZOSYN) IVPB 3.375 g  Status:  Discontinued        3.375 g 12.5 mL/hr over 240 Minutes Intravenous Every 8 hours 03/22/22 1517 03/24/22 0811   03/22/22 1700  vancomycin (VANCOCIN) IVPB 1000 mg/200 mL premix  Status:  Discontinued  1,000 mg 200 mL/hr over 60 Minutes Intravenous Every 24 hours 03/21/22 1727 03/22/22 0844   03/20/22 0647  vancomycin (VANCOCIN) 1-5 GM/200ML-% IVPB       Note to Pharmacy: Dara Lords M: cabinet override       03/20/22 0647 03/20/22 0832   03/18/22 0500  vancomycin (VANCOCIN) IVPB 1000 mg/200 mL premix  Status:  Discontinued        1,000 mg 200 mL/hr over 60 Minutes Intravenous Every 12 hours 03/17/22 1614 03/21/22 1727   03/17/22 1700  vancomycin (VANCOREADY) IVPB 1500 mg/300 mL        1,500 mg 150 mL/hr over 120 Minutes Intravenous  Once 03/17/22 1609 03/17/22 1927   03/17/22 1000  metroNIDAZOLE (FLAGYL) IVPB 500 mg  Status:  Discontinued        500 mg 100 mL/hr over 60 Minutes Intravenous Every 12 hours 03/17/22 0915 03/22/22 1517   03/14/22 1400  ceFEPIme (MAXIPIME) 2 g in sodium chloride 0.9 % 100 mL IVPB  Status:  Discontinued        2 g 200 mL/hr over 30 Minutes Intravenous Every 8 hours 03/14/22 0845 03/22/22 1517   03/10/22 1400  valACYclovir (VALTREX) tablet 500 mg  Status:  Discontinued        500 mg Oral Every morning 03/10/22 1021 03/21/22 0751   03/10/22 0500  ceFEPIme (MAXIPIME) 2 g in sodium chloride 0.9 % 100 mL IVPB  Status:  Discontinued        2 g 200 mL/hr over 30 Minutes Intravenous Every 12 hours 03/09/22 1750 03/14/22 0845   03/10/22 0400  metroNIDAZOLE (FLAGYL) IVPB 500 mg        500 mg 100 mL/hr over 60 Minutes Intravenous Every 12 hours 03/09/22 1734 03/16/22 0705   03/09/22 1645  ceFEPIme (MAXIPIME) 2 g in sodium chloride 0.9 % 100 mL IVPB        2 g 200 mL/hr over 30 Minutes Intravenous  Once 03/09/22 1635 03/09/22 1721   03/09/22 1630  metroNIDAZOLE (FLAGYL) IVPB 500 mg  Status:  Discontinued        500 mg 100 mL/hr over 60 Minutes Intravenous Every 12 hours 03/09/22 1621 03/09/22 1743         Note: Portions of this report may have been transcribed using voice recognition software. Every effort was made to ensure accuracy; however, inadvertent computerized transcription errors may be present.   Any transcriptional errors that result from this process are unintentional.    Adin Hector, MD, FACS, MASCRS Esophageal, Gastrointestinal & Colorectal  Surgery Robotic and Minimally Invasive Surgery  [facility], [place]  [place]. [address] [place], [place] [address] [phone] Fax [phone] Main  CONTACT INFORMATION:  Weekday (9AM-5PM): Call [facility] main office at [phone]  Weeknight (5PM-9AM) or Weekend/Holiday: Check [email or web address] (password " [email or web address]") for General Surgery [facility] coverage  (Please, do not use SecureChat as it is not reliable communication to operating surgeons for immediate patient care)      04/04/2022  7:39 AM

## 2022-04-05 ENCOUNTER — Inpatient Hospital Stay (HOSPITAL_COMMUNITY): Payer: BC Managed Care – PPO

## 2022-04-05 DIAGNOSIS — K651 Peritoneal abscess: Secondary | ICD-10-CM

## 2022-04-05 DIAGNOSIS — Z933 Colostomy status: Secondary | ICD-10-CM

## 2022-04-05 LAB — CBC
HCT: 26.8 % — ABNORMAL LOW (ref 39.0–52.0)
Hemoglobin: 8.7 g/dL — ABNORMAL LOW (ref 13.0–17.0)
MCH: 30.7 pg (ref 26.0–34.0)
MCHC: 32.5 g/dL (ref 30.0–36.0)
MCV: 94.7 fL (ref 80.0–100.0)
Platelets: 410 10*3/uL — ABNORMAL HIGH (ref 150–400)
RBC: 2.83 MIL/uL — ABNORMAL LOW (ref 4.22–5.81)
RDW: 14 % (ref 11.5–15.5)
WBC: 7.2 10*3/uL (ref 4.0–10.5)
nRBC: 0 % (ref 0.0–0.2)

## 2022-04-05 LAB — FOLATE: Folate: 10.8 ng/mL (ref >5.9)

## 2022-04-05 LAB — IRON AND TIBC
Iron: 50 ug/dL (ref 45–182)
Saturation Ratios: 28 % (ref 17.9–39.5)
TIBC: 176 ug/dL — ABNORMAL LOW (ref 250–450)
UIBC: 126 ug/dL

## 2022-04-05 LAB — RETICULOCYTES
Immature Retic Fract: 34.3 % — ABNORMAL HIGH (ref 2.3–15.9)
RBC.: 2.86 MIL/uL — ABNORMAL LOW (ref 4.22–5.81)
Retic Count, Absolute: 82.4 10*3/uL (ref 19.0–186.0)
Retic Ct Pct: 2.9 % (ref 0.4–3.1)

## 2022-04-05 LAB — FERRITIN: Ferritin: 1068 ng/mL — ABNORMAL HIGH (ref 24–336)

## 2022-04-05 LAB — PREALBUMIN: Prealbumin: 21 mg/dL (ref 18–38)

## 2022-04-05 LAB — VITAMIN B12: Vitamin B-12: 572 pg/mL (ref 180–914)

## 2022-04-05 MED ORDER — SODIUM CHLORIDE (PF) 0.9 % IJ SOLN
INTRAMUSCULAR | Status: AC
  Filled 2022-04-05: qty 50

## 2022-04-05 MED ORDER — LOPERAMIDE HCL 2 MG PO CAPS
2.0000 mg | ORAL_CAPSULE | Freq: Three times a day (TID) | ORAL | Status: DC
  Administered 2022-04-05 – 2022-04-09 (×14): 2 mg via ORAL
  Filled 2022-04-05 (×14): qty 1

## 2022-04-05 MED ORDER — IOHEXOL 300 MG/ML  SOLN
100.0000 mL | Freq: Once | INTRAMUSCULAR | Status: AC | PRN
  Administered 2022-04-05: 100 mL via INTRAVENOUS

## 2022-04-05 MED ORDER — HYDROMORPHONE HCL 1 MG/ML IJ SOLN
0.5000 mg | INTRAMUSCULAR | Status: DC | PRN
  Administered 2022-04-06: 0.5 mg via INTRAVENOUS
  Filled 2022-04-05: qty 1

## 2022-04-05 NOTE — Progress Notes (Signed)
Nutrition Follow-up  INTERVENTION:   -48 Calorie Count ordered  -Ensure Plus High Protein po TID, each supplement provides 350 kcal and 20 grams of protein.    -Prosource Plus PO QID, each provides 100 kcals and 15g protein   NUTRITION DIAGNOSIS:   Increased nutrient needs related to acute illness, post-op healing as evidenced by estimated needs.  Ongoing.  GOAL:   Patient will meet greater than or equal to 90% of their needs  Progressing.  MONITOR:   PO intake, Supplement acceptance, Diet advancement, Labs, Weight trends, Other (Comment) (TPN regimen)  REASON FOR ASSESSMENT:   Consult Calorie Count  ASSESSMENT:   71 y.o. male with a past medical history of hypertension, GERD, status post appendectomy, status post inguinal hernia repair, status post transanal hemorrhoid dearterialization (03/05/2022 performed by Dr. Marcello Moores).  Presents to the emergency department with a complaint of abdominal pain, nausea, vomiting, and fever.  Significant Events: 4/25- admission 4/26- diet changed from NPO to NPO with sips of clears allowed 4/28- initial RD assessment; double lumen PICC placed in R brachial; TPN initiation 5/1- anal exam with debridement, I&D of anal canal, proctoscopy d/t pelvic infection--found to have necrosis of anoderm and large R gluteal abscess which was opened and drained 5/6- diagnostic ex lap, abdominal washout and drain placement, diverting colostomy, anal exam 5/16- Regular diet 5/18- TPN stopped  Patient in room with partner at bedside.  States he is eating a lot better. Drinking all of his supplements.  Calorie Count: 5/21 B:460 kcals, 10g protein L: 170 kcals, 3g protein D: 220 kcals, 9g protein Supplements:1100 kcals, 100g (2 Ensures, 4 Prosource Plus) Total:1950 kcals, 122g protein   For today 5/22, pt ate a fruit cup this morning. Was not feeling that well this morning. Has had 2 Prosource Plus so far, no Ensures yet.  Snacking on saltines  with egg salad. Plans to order pot roast for dinner today.  Medications: Vitamin C, Pepcid, Ferrous sulfate, Imodium, Multivitamin with minerals daily, Fibercon, KLOR-CON  Labs reviewed: Low Na TG:173  Diet Order:   Diet Order             Diet regular Room service appropriate? Yes with Assist; Fluid consistency: Thin  Diet effective now                   EDUCATION NEEDS:   Education needs have been addressed  Skin:  Skin Assessment: Skin Integrity Issues: Skin Integrity Issues:: Incisions Incisions: rectum (5/1) and abdomen (5/6)  Last BM:  5/14 -type 7  Height:   Ht Readings from Last 1 Encounters:  03/18/22 '5\' 9"'$  (1.753 m)    Weight:   Wt Readings from Last 1 Encounters:  03/26/22 88.6 kg    BMI:  Body mass index is 28.84 kg/m.  Estimated Nutritional Needs:   Kcal:  2400-2700 kcal  Protein:  125-140 grams  Fluid:  >/= 2.5 L/day   Clayton Bibles, MS, RD, LDN Inpatient Clinical Dietitian Contact information available via Amion

## 2022-04-05 NOTE — Progress Notes (Signed)
LLQ drain leaking around site. Dressings changed multiple times during shift. MD made aware.

## 2022-04-05 NOTE — Progress Notes (Signed)
Inpatient Rehab Admissions Coordinator:   Will open insurance today.    Shann Medal, PT, DPT Admissions Coordinator [phone] 04/05/22  10:38 AM

## 2022-04-05 NOTE — TOC Progression Note (Addendum)
Transition of Care [facility]) - Progression Note    Patient Details  Name: Tyler [name] MRN: [id number] Date of Birth: 10/21/1951  Transition of Care [facility] CM/SW Contact  Ross Ludwig, [place] Phone Number: 04/05/2022, 3:16 PM  Clinical Narrative:     CIR is starting insurance authorization for patient.  CSW to continue to follow patient's progress throughout discharge planning.     Barriers to Discharge: Continued Medical Work up  Expected Discharge Plan and Services    Inpatient rehab pending insurance authorization.                                             Social Determinants of Health (SDOH) Interventions    Readmission Risk Interventions     View : No data to display.

## 2022-04-05 NOTE — Progress Notes (Signed)
16 Days Post-Op ex lap, ostomy creation and drain placement Subjective: Having some intolerable pain with just tylenol.  Doesn't like the oxycodone d/t nausea, Tramadol working better. Got up with PT.  Having a lot if diarrhea  Objective: Vital signs in last 24 hours: Temp:  [97.5 F (36.4 C)-98.4 F (36.9 C)] 97.7 F (36.5 C) (05/22 0602) Pulse Rate:  [84-106] 99 (05/22 0602) Resp:  [17-19] 18 (05/22 0602) BP: (123-138)/(80-94) 138/94 (05/22 0602) SpO2:  [98 %-99 %] 99 % (05/22 0602)   Intake/Output from previous day: 05/21 0701 - 05/22 0700 In: 720 [P.O.:720] Out: 3370 [Urine:3250; Drains:70] Intake/Output this shift: No intake/output data recorded.   General appearance: alert and cooperative GI: soft, non-distended, less TTP Pelvis JP with purulent fluid Sub Q and RP drains with dark colored, purulent fluid dressings in place Colostomy viable with stool and air  Lab Results:  Recent Labs    04/04/22 0502 04/05/22 0432  WBC 6.7 7.2  HGB 8.7* 8.7*  HCT 25.8* 26.8*  PLT 417* 410*    BMET Recent Labs    04/04/22 0502 04/04/22 2333  NA 138 134*  K 3.5 3.6  CL 108 107  CO2 24 21*  GLUCOSE 98 113*  BUN 25* 24*  CREATININE 0.93 0.91  CALCIUM 8.2* 7.7*    PT/INR No results for input(s): LABPROT, INR in the last 72 hours.  ABG No results for input(s): PHART, HCO3 in the last 72 hours.  Invalid input(s): PCO2, PO2   MEDS, Scheduled  (feeding supplement) PROSource Plus  30 mL Oral TID PC & HS   acetaminophen  1,000 mg Oral Q6H   vitamin C  500 mg Oral BID   chlorhexidine  15 mL Mouth Rinse BID   Chlorhexidine Gluconate Cloth  6 each Topical Q0600   enoxaparin (LOVENOX) injection  40 mg Subcutaneous Q24H   famotidine  20 mg Oral BID   feeding supplement  237 mL Oral TID BM   ferrous sulfate  325 mg Oral BID WC   lip balm  1 application. Topical BID   loperamide  2 mg Oral TID with meals   mouth rinse  15 mL Mouth Rinse q12n4p   methocarbamol  500  mg Oral QID   metoprolol tartrate  12.5 mg Oral BID   multivitamin with minerals  1 tablet Oral Daily   nystatin   Topical BID   pantoprazole  40 mg Oral Daily   polycarbophil  625 mg Oral BID   potassium chloride  40 mEq Oral Daily   sodium chloride flush  10-40 mL Intracatheter Q12H    Studies/Results: No results found.  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/09/2022   Pruritus ani 04/20/2018   Arthritis of both knees 01/13/2018   Hx of adenomatous polyp of colon 04/07/2017   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   Perianal dermatitis 01/14/2016   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/12/2013   History of tobacco use 11/12/2013   Low testosterone 09/19/2013   Sinusitis 12/21/2012   Herpes labialis 10/30/2012   Eczema 10/30/2012   Prolapsed internal hemorrhoids, grade 3, THD ligation 03/07/2022 10/30/2012   Hypertension 04/13/2011   FASCIITIS, PLANTAR 05/14/2010   NEOPLASM, SKIN, UNCERTAIN BEHAVIOR 09/17/1593   Chronic rhinitis 08/16/2008   Rash and other nonspecific skin eruption 07/02/2008   ABNORMAL COAGULATION PROFILE 05/29/2008   Gastroesophageal reflux disease 06/28/2007   PITYRIASIS ROSEA 06/28/2007   Osteoarthritis of knee 06/28/2007   Asthmatic bronchitis 05/11/2007    Pelvic sepsis.  Currently drained and debrided.     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    OR findings 5/6: significant RP infection, drained via L pericolic gutter, presacral infection, drained, anterior abd wall infection, drained.  CT 5/14: L RP fluid collection slightly small, L thigh fluid collection stable  IR aspiration: 5/16, cultures pending Plan: Nutrition: Cont protein supplements.  TPN off  ID: Cont IV antibiotics per ID.   PICC removed.  Recheck CT scan today Pain: Cont scheduled tylenol, tramadol  GI: Scheduled Imodium for diarrhea Anxiety: ativan PRN Cont SCD's dressing changes q shift Cont foley d/t urinary retention     LOS: 26 days     Rosario Adie, MD [facility], [place] Pager: [phone]   04/05/2022 8:39 AM

## 2022-04-05 NOTE — Consult Note (Addendum)
[place] Nurse ostomy follow up Stoma type/location: LLQ colostomy Stomal assessment/size: 1 and 1/2 round stoma Peristomal assessment: irritant contact dermatitis  ICD-10 CM Codes for Irritant Dermatitis fecal, urinary or dual incontinence  L24A9 - Due to friction or contact with other specified body fluids  L24B3 - Related to fecal or urinary stoma or fistula   Treatment options for stomal/peristomal skin:  Output  Ostomy pouching: 1pc.convex pouch, 1 and 1/2 inch compressible convex Lawson # [id number] and 1 and 1/2 skin barrier rings, Lawson # [phone]. The second ring is to be place on top of the first ring from 7 o'clock to 10 o'clock. Education provided:  Extended session with partner for trouble shooting. First and foremost, they are taught that teaching regarding ostomy in general is our goal today-we are not able to teach how to pouch until we can achieve a repeatable and predictable pouching routine. I tell them that our goal today is 24 hours and we will incrementally increase from there. They express gratitude and relief. Explained role of ostomy nurse and creation of stoma  Explained stoma characteristics (budded, flush, color, texture, care) Demonstrated pouch change (cutting new skin barrier, measuring stoma, cleaning peristomal skin and stoma, use of barrier ring) Education on emptying when 1/3 to 1/2 full and how to empty Demonstrated use of wick to clean spout  Demonstrated treatment of peristomal skin (ostomy powder, skin barrier wipes) Use of belt.  Belt applied today.  Answered patient/family questions:  Patient and partner are overwhelmed by near-constant leakage for 36 hours, pain from dermatitis and no sleep due to fear of movement or leakage. Questions answered about future, expected state.   Enrolled patient in [place] Start Discharge program: No    Note:  Undermining of pouch seal is partially attributed to large amount of drainage from peritube wound in  LLQ.  Dressing (Split gauze) is appropriate, but it is recommended to use only one and change frequently, rather than placing many and having leakage rest upon the skin contributing to irritant contact dermatitis. Check frequently-recommend every hour-and change with any sign of soiling.  [place] nursing team will follow, and will remain available to this patient, the nursing and medical teams.    Instructions and extra supplies at bedside for pouching and treatment of irritant contact dermatitis. Step-by-step instructions left for pouch change procedures in room (several copies).  My associates will see in my absence.  Thanks, Maudie Flakes, MSN, RN, [place], [facility] Abbott  Pager# [phone]

## 2022-04-05 NOTE — Progress Notes (Signed)
[facility] for Infectious Disease  Date of Admission:  03/09/2022           Reason for visit: Follow up on intra-abdominal infection   Current antibiotics: Zosyn    ASSESSMENT:    71 y.o. male admitted with:  Intra-abdominal infection: Patient admitted with sepsis following initial transanal hemorrhoidal dearterialization 03/05/2022.  Presented this admission on 4/25 with abdominal pain and fever and taken to the OR 5/1 with findings of necrosis of the anoderm posteriorly on EUA and large right gluteal abscess which was opened and drained.  Repeat CT 5/5 given ongoing sepsis physiology showed significant retroperitoneal contamination.  Taken back to the OR 5/6 with findings of significant infection status post creation of diverting colostomy and drain placement x3 as well as a Penrose drain in the peritoneal space.  Cultures from the OR at this time were notable for E. coli, E faecalis, anaerobes.  Follow-up CT scan 5/12 showed edema/inflammation and soft tissue gas in the pelvic floor and presacral space with surgical drains in place.  He did have a 5.3 x 3.5 x 7.2 cm fluid collection of the left upper quadrant as well as intramuscular fluid/edema in the thighs left greater than right concerning for possible cellulitis/myositis.  IR was consulted for possible drainage of the left upper quadrant fluid collection, however, this was deemed too risky of an anatomical location to do so.  Another CT scan was obtained 5/14 was notable for overall stability in the pelvis.  The left upper quadrant fluid collection had decreased in size since the prior study and was felt to be contiguous with the left lower quadrant surgical drain. Intramuscular left thigh fluid collection: CT 5/14 noted progression of intramuscular fluid collections within the left thigh concerning for intramuscular abscesses.  IR recommended MRI which was attempted, but he was unable to tolerate this. Follow up lower extremity  ultrasound 5/16 shows what appears consistent with abscesses and now s/p IR aspiration/drainage 5/16. Cultures growing c albican. No drain was placed Fevers: Suspect secondary to undrained fluid collections versus possible PICC line as well given that his fever development seems to coincide with PICC placement on 4/28.  Fever curve has improved s/p IR aspiration of the thigh.  Penicillin allergy: Have discussed at length with patient and partner.  At this time, would rather exclude other ongoing potential causes of fever rather than attribute to Zosyn. He has been tolerating piptazo Protein calorie malnutrition: Hopefully can stay off TPN and remove PICC.   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 and unlikely there is another oral alternative to iv micafungin His intraabd abscess would be most simple with piptazo treatment at this time otherwise would be a combination of another 3 oral agents rather high pill burden Discussed with primary team   I spent more than 50 minute reviewing data/chart, and coordinating care and >50% direct face to face time providing counseling/discussing diagnostics/treatment plan with patient     Principal Problem:   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 Active Problems:   Chronic rhinitis   Asthmatic bronchitis   Gastroesophageal reflux disease   Hypertension   Eczema   Prolapsed internal hemorrhoids, grade 3, THD ligation 03/07/2022   Abdominal pain   Acute urinary retention   History of tobacco use   Normal anion gap  metabolic acidosis   Acute blood loss anemia (ABLA)   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 Topical BID   loperamide  2 mg Oral TID with meals   mouth rinse  15 mL Mouth Rinse q12n4p   methocarbamol  500 mg Oral QID   metoprolol tartrate  12.5 mg Oral BID   multivitamin with minerals  1 tablet Oral Daily   nystatin   Topical BID   pantoprazole  40 mg Oral Daily   polycarbophil  625 mg Oral BID   potassium chloride  40 mEq Oral Daily   sodium chloride flush  10-40 mL Intracatheter Q12H   Continuous Infusions:  sodium chloride 10 mL/hr at 04/01/22 1200   lactated ringers     methocarbamol (ROBAXIN) IV     micafungin (MYCAMINE) IV 100 mg (04/04/22 1333)   ondansetron (ZOFRAN) IV     piperacillin-tazobactam (ZOSYN)  IV 3.375 g (04/05/22 0823)   PRN Meds:.sodium chloride, albuterol, alum & mag hydroxide-simeth, azelastine, diphenhydrAMINE, enalaprilat, HYDROmorphone (DILAUDID) injection, ibuprofen, lactated ringers, LORazepam, LORazepam, magic mouthwash, menthol-cetylpyridinium, methocarbamol (ROBAXIN) IV, metoprolol tartrate, ondansetron (ZOFRAN) IV **OR** ondansetron (ZOFRAN) IV, ondansetron **OR** [DISCONTINUED] ondansetron (ZOFRAN) IV, phenol, prochlorperazine, simethicone, sodium chloride flush, traMADol, traZODone  SUBJECTIVE:   24 hour events:  Off tpn Picc removed prior to weekend Feels much better in terms of oral intake, mentation No fever No rash No increased output in colostomy Left thigh feeling better although still painful  Review of Systems  All other systems reviewed and are negative.    OBJECTIVE:   Blood pressure (!) 138/94, pulse 99,  temperature 97.7 F (36.5 C), temperature source Oral, resp. rate 18, height '5\' 9"'$  (1.753 m), weight 88.6 kg, SpO2 99 %. Body mass index is 28.84 kg/m.  Physical exam: General/constitutional: no distress, pleasant HEENT: Normocephalic, PER, Conj Clear, EOMI, Oropharynx clear Neck supple CV: rrr no mrg Lungs: clear to auscultation, normal respiratory effort Abd: Soft, Nontender -- colostomy functioning; 3 drains present -- 2 in the rlq purulent fluid and 1 on the left lower quadrant black murky fluid Ext: no edema -- no obvious swelling left thigh Skin: No Rash Neuro: nonfocal MSK: no peripheral joint swelling/tenderness/warmth   Central line presence: no    Lab Results: Lab Results  Component Value Date   WBC 7.2 04/05/2022   HGB 8.7 (L) 04/05/2022   HCT 26.8 (L) 04/05/2022   MCV 94.7 04/05/2022   PLT 410 (H) 04/05/2022    Lab Results  Component Value Date   NA 134 (L) 04/04/2022   K 3.6 04/04/2022   CO2 21 (L) 04/04/2022   GLUCOSE 113 (H) 04/04/2022   BUN 24 (H) 04/04/2022   CREATININE 0.91 04/04/2022   CALCIUM 7.7 (L) 04/04/2022  GFRNONAA >60 04/04/2022   GFRAA  03/23/2022    QUESTIONABLE RESULTS, RECOMMEND RECOLLECT TO VERIFY    Lab Results  Component Value Date   ALT 55 (H) 04/04/2022   AST 51 (H) 04/04/2022   ALKPHOS 436 (H) 04/04/2022   BILITOT 0.8 04/04/2022    No results found for: CRP  No results found for: ESRSEDRATE   I have reviewed the micro and lab results in Epic.  Imaging: Imaging independently reviewed in Epic and incorporated into decision making    5/14 abd pelv ct with contrast 1. Persistent gas and fluid collection anterior to the left kidney, likely contiguous with the area drain by the left lower quadrant surgical drain. Slight interval decrease in size since prior study. 2. Extensive inflammatory changes within the presacral space and along the pelvic sidewall, with numerous surgical drains as above, without significant  change since prior study. 3. Interval progression of intramuscular fluid collections within the medial and posterior musculature of the proximal left thigh, concerning for intramuscular abscesses. Largest collection within the left hamstring musculature as above. 4. Left lower quadrant colostomy, with persistent wall thickening of the distal colon just proximal to the colostomy site. This may be secondary inflammatory change due to the intraperitoneal process, versus primary colitis. 5. Bilateral pleural effusions and dependent atelectasis, left greater than right, with slight progression since prior study. 6.  Aortic Atherosclerosis     Jabier Mutton, [place] for [place] [phone]  pager   [phone] cell 04/05/2022, 2:06 PM

## 2022-04-05 NOTE — Progress Notes (Signed)
Chaplain engaged in an initial visit with Tyler [name] and his partner, Jonni Sanger.  Chaplain spent some time with Jonni Sanger outside the room, learning of [name]'s healthcare journey and the toll it has taken on them both.  Chaplain wanted to honor [name]'s arrival to see Tyler [name] and give them space to talk.  Chaplain engaged in an introduction with Tyler [name] who voiced that his Idelle Crouch has been to see him several times and that he is doing much better.  He did note that he had a episode of crying recently and his attending physician offered him peace over his situation.    Chaplain offered a compassionate presence to offer space to share, reflective listening, and support.  Chaplain will follow-up later today for individual care.    04/05/22 1000  Clinical Encounter Type  Visited With Patient and family together  Visit Type Initial;Spiritual support  Referral From Physician;Patient's clergy  Consult/Referral To Chaplain

## 2022-04-06 MED ORDER — METHOCARBAMOL 500 MG PO TABS
1000.0000 mg | ORAL_TABLET | Freq: Three times a day (TID) | ORAL | Status: DC
  Administered 2022-04-06: 1000 mg via ORAL
  Administered 2022-04-06 (×2): 500 mg via ORAL
  Administered 2022-04-07 – 2022-04-09 (×5): 1000 mg via ORAL
  Filled 2022-04-06 (×8): qty 2

## 2022-04-06 NOTE — Progress Notes (Signed)
Chaplain engaged in a follow-up visit with [name].  [name] shared about his PT journey today and how hard that was on him.  He voiced that he was feeling exhausted and short of breath at times.  [name] also talked about his healthcare journey and the road ahead for him.  Chaplain can assess that [name]'s progress may feel daunting to him.  Chaplain worked to offer encouragement and normalize what he has been experiencing.  [name] noted that at one point he did want to give up but that he is in a much better place now.  [name] continued to state, "I will get there."  Chaplain affirmed that statement.    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  He and his partner are musically inclined and met through music courses.    [name] stated that several people have offered him support including having his Idelle Crouch come in.  Chaplain let him know that if he needed an additional layer of support through his extended time that Chaplains are there for him.   Chaplain worked to Cytogeneticist and relationship, offering listening as he shared his story, and offer further community.     04/06/22 1500  Clinical Encounter Type  Visited With Patient  Visit Type Follow-up

## 2022-04-06 NOTE — Progress Notes (Signed)
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  Presented to [facility] ER on 4/25 with nausea, fever, and abdominal pain.  Repeat CT abdomen/pelvis showed possible perforation.  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 He was taken to the OR on 5/06 and had diagnostic laparotomy, abdominal wash out, drain placement and diverting colostomy.  5/14 PCCM reconsulted for tachycardia and possible sepsis.  MRI Lt femur negative for abscess but ultrasound positive for fluid collection aspirated by IR   OT comments  Pt making good progress with adls and functional mobility. Pt most limited by decreased endurance and orthostatic hypotension. BPs are as follows: Supine:  119/88 Sitting 110/88 Standing 94/74 Sitting:  105/73  Pt very motivated and participated well in all mobility and adls. Encouraged pt to focus on one day at a time with his recovery as pt is very overwhelmed with his weakness.  Will continue to see with focus on standing.    Recommendations for follow up therapy are one component of a multi-disciplinary discharge planning process, led by the attending physician.  Recommendations may be updated based on patient status, additional functional criteria and insurance authorization.    Follow Up Recommendations  Acute inpatient rehab (3hours/day)    Assistance Recommended at Discharge Frequent or constant Supervision/Assistance  Patient can return home with the following  A lot of help with bathing/dressing/bathroom;Assistance with cooking/housework;Direct supervision/assist for financial management;Direct supervision/assist for medications management;Help with stairs or ramp for entrance;A little help with walking and/or transfers   Equipment  Recommendations  BSC/3in1;Tub/shower seat    Recommendations for Other Services      Precautions / Restrictions Precautions Precautions: Fall Precaution Comments: 2 drains, colostomy, IV Restrictions Weight Bearing Restrictions: No       Mobility Bed Mobility Overal bed mobility: Needs Assistance Bed Mobility: Rolling, Sidelying to Sit Rolling: Supervision Sidelying to sit: Min assist, HOB elevated Supine to sit: Min assist, HOB elevated     General bed mobility comments: Pt requiring +1 min assist only for reaching full sit and managing lines    Transfers Overall transfer level: Needs assistance Equipment used: Rolling walker (2 wheels) Transfers: Bed to chair/wheelchair/BSC, Sit to/from Stand Sit to Stand: Min assist, From elevated surface Stand pivot transfers: Min assist, From elevated surface   Step pivot transfers: Min assist     General transfer comment: Pt fatigues very quickly requiring assist to power up. Pt anxious about standing and becoming dizzy.     Balance Overall balance assessment: Needs assistance Sitting-balance support: No upper extremity supported, Feet supported Sitting balance-Leahy Scale: Fair Sitting balance - Comments: fatigues quickly Postural control: Posterior lean Standing balance support: During functional activity, Reliant on assistive device for balance, Bilateral upper extremity supported Standing balance-Leahy Scale: Poor Standing balance comment: reliant on walker                           ADL either performed or assessed with clinical judgement   ADL Overall ADL's : Needs assistance/impaired Eating/Feeding: Set up;Sitting   Grooming: Set up;Sitting   Upper Body Bathing: Sitting;Minimal assistance Upper Body Bathing Details (indicate cue type and reason): sitting EOB Lower Body Bathing: Moderate assistance;Sit to/from stand;Cueing for compensatory techniques Lower Body Bathing Details (indicate  cue type and  reason): Pt unable to sit in figure 4 position or lean foward at this time.  Stretching to hip joint in figure 4 completed in prep for LE dressing. Pt may benefit from adaptive eqiupment. Upper Body Dressing : Minimal assistance;Sitting   Lower Body Dressing: Maximal assistance;Sit to/from stand Lower Body Dressing Details (indicate cue type and reason): min assist for sit to stand from elevated bed. Total assist for shoes and socks at this time due to fatigue and inablility to access feet due to pain.   Toilet Transfer Details (indicate cue type and reason): deferred - colostomy, catheter Toileting- Clothing Manipulation and Hygiene: Total assistance       Functional mobility during ADLs: Minimal assistance General ADL Comments: Pt overall doing UE adls with min assist to set up and LE adls with mod to max assist. Pt very dizzy when standing today and was orthostatic.  See assessment section for BPs.    Extremity/Trunk Assessment Upper Extremity Assessment Upper Extremity Assessment: Overall WFL for tasks assessed   Lower Extremity Assessment Lower Extremity Assessment: Defer to PT evaluation        Vision   Vision Assessment?: No apparent visual deficits Additional Comments: wears glasses   Perception Perception Perception: Within Functional Limits   Praxis Praxis Praxis: Intact    Cognition Arousal/Alertness: Awake/alert Behavior During Therapy: WFL for tasks assessed/performed Overall Cognitive Status: Within Functional Limits for tasks assessed                                          Exercises      Shoulder Instructions       General Comments Pt limited by activity tolerance and low BP in standing.    Pertinent Vitals/ Pain       Pain Assessment Pain Assessment: 0-10 Faces Pain Scale: Hurts little more Pain Location: abdomen with movement Pain Descriptors / Indicators: Grimacing, Tender Pain Intervention(s): Limited activity within  patient's tolerance, Monitored during session, Premedicated before session, Repositioned  Home Living                                          Prior Functioning/Environment              Frequency  Min 2X/week        Progress Toward Goals  OT Goals(current goals can now be found in the care plan section)  Progress towards OT goals: Progressing toward goals  Acute Rehab OT Goals Patient Stated Goal: to get stronger OT Goal Formulation: With patient Time For Goal Achievement: 04/14/22 Potential to Achieve Goals: Good ADL Goals Pt Will Perform Upper Body Bathing: with supervision;sitting Pt Will Perform Lower Body Dressing: with min assist;sit to/from stand Additional ADL Goal #1: Patient will stand at sink to perform grooming task as evidence of improving activity tolerance Additional ADL Goal #2: Patient will perform 10 min functional activity or exercise activity as evidence of improving activity tolerance  Plan Discharge plan remains appropriate    Co-evaluation                 AM-PAC OT "6 Clicks" Daily Activity     Outcome Measure   Help from another person eating meals?: None Help from another person taking care of personal grooming?: None Help  from another person toileting, which includes using toliet, bedpan, or urinal?: Total Help from another person bathing (including washing, rinsing, drying)?: A Lot Help from another person to put on and taking off regular upper body clothing?: A Little Help from another person to put on and taking off regular lower body clothing?: Total 6 Click Score: 15    End of Session Equipment Utilized During Treatment: Rolling walker (2 wheels)  OT Visit Diagnosis: Muscle weakness (generalized) (M62.81);Pain   Activity Tolerance Patient tolerated treatment well   Patient Left in chair;with call bell/phone within reach   Nurse Communication Mobility status        Time: 6384-5364 OT Time  Calculation (min): 47 min  Charges: OT General Charges $OT Visit: 1 Visit OT Treatments $Self Care/Home Management : 38-52 mins    Glenford Peers 04/06/2022, 11:47 AM

## 2022-04-06 NOTE — Consult Note (Addendum)
[place] Nurse ostomy follow up Ostomy pouch applied by WOC yesterday is intact with good seal, mod amt semi-formed stool in the pouch. [place] team will perform another pouch change Wed or Thurs, depending on the patient's needs. Discussed plan of care with patient. Supplies at the bedside and instructions are taped to the wall for bedside nurses to perform PRN if leakage occurs.  Julien Girt MSN, RN, [place], [place], [place]

## 2022-04-06 NOTE — TOC Progression Note (Signed)
Transition of Care [facility]) - Progression Note    Patient Details  Name: Tyler [name] MRN: [id number] Date of Birth: March 14, 1951  Transition of Care [facility]) CM/SW Contact  Ross Ludwig, [place] Phone Number: 04/06/2022, 6:16 PM  Clinical Narrative:     CIR received insurance authorization for patient.  Continuing to follow in case TOC needs arise.    Barriers to Discharge: Continued Medical Work up  Expected Discharge Plan and Services                                                 Social Determinants of Health (SDOH) Interventions    Readmission Risk Interventions     View : No data to display.

## 2022-04-06 NOTE — Progress Notes (Signed)
Pt's LLQ JP drain came out around 0555. Will defer to dayshift team. Charge RN notified and made aware of situation. No bleeding. Site continues to have minimal drainage. Covered with dry dressing. Pt resting with eyes closed. No complaints of pain. Plan of care ongoing.

## 2022-04-06 NOTE — H&P (Signed)
Physical Medicine and Rehabilitation Admission H&P    Chief Complaint  Patient presents with   Fever   Post-op Problem   Emesis  : HPI: Tyler [name] is a 71 year old right-handed male with history of anxiety, Thrombocytopeni, hypertension, quit smoking 33 years ago.  Per chart review lives with significant other.  1 level home 3 steps to entry.  Independent prior to admission.  Patient with recent transanal hemorrhoidal dearterialization 4/21 outpatient procedure per Dr. Leighton Ruff.  Postoperative course complicated by increasing abdominal pain nausea and fever that persisted x2 days.  He presented to the emergency department 03/09/2022.  Chemistry showed sodium 132 potassium 3.4 BUN 27 creatinine 1.27, his white count was upper end of normal 10.1.  CT scan of the abdomen pelvis performed showing thickening of the rectal wall.  There is also gas and fluid within the rectal wall and in the presacral space concerning for possible rectal perforation.  He was made n.p.o. maintained on TPN for nutritional support with broad-spectrum antibiotics.  His white count increased to 20,000.  He underwent anal exam under anesthesia with debridement, incision and drainage of anal canal proctoscopy 03/15/2022.  Hospital course persistent fevers transferred to progressive care and underwent diagnostic laparoscopy, abdominal washout and drain placement with creation of diverting colostomy 03/20/2022.  Follow-up CT abdomen pelvis 03/26/2022 showed extensive edema/inflammation and soft tissue gas in the pelvic floor and presacral space as well as a 5.3 x 3.5 x 7.2 cm collection of gas and fluid in the left upper quadrant anterior to the kidney and medial to the spleen.  Infectious disease Dr. Juleen China consulted in regards to suspect intra-abdominal infection and currently maintained on intravenous Zosyn for coverage as well as micafungin x6 weeks from 5/16 with repeat CT abdomen/pelvis and left thigh and 1 to 2 weeks..   Latest CT abdomen pelvis 04/05/2022 showed improvement in small bilateral pleural effusions and bibasilar atelectasis as well as improvement in body wall edema.  Small air-fluid collection anterior to the left kidney with possibly developing small fluid collection in the left pelvic sidewall 5.2 x 2.2 cm.  No new fluid collections adjacent to the drains.  He was cleared to begin Lovenox for DVT prophylaxis.  Left lower quadrant JP drain inadvertently came out around 6 AM 04/06/2022 with fall per surgery advise just continue to monitor with dry dressing applied.  Hospital course anemia 8.0.  His white blood cell count has improved to 5300.  Bouts of urinary retention he did require a Foley tube planning voiding trial.  Diet has been advanced to regular and TPN discontinued.  Therapy evaluations completed due to patient's decreased functional mobility was admitted for a comprehensive rehab program.  Review of Systems  Constitutional:  Positive for fever. Negative for chills.  HENT:  Negative for hearing loss.   Eyes:  Negative for blurred vision and double vision.  Respiratory:  Negative for cough and shortness of breath.   Cardiovascular:  Negative for chest pain, palpitations and leg swelling.  Gastrointestinal:  Positive for abdominal pain, diarrhea, nausea and vomiting.       GERD  Genitourinary:  Positive for urgency. Negative for dysuria, flank pain and hematuria.  Musculoskeletal:  Positive for joint pain and myalgias.  Skin:  Negative for rash.  Psychiatric/Behavioral:         Anxiety  All other systems reviewed and are negative. 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), bilateral    Chronic pruritic rash in adult 2004   hx shingles  w/ residual recurrent rash, take valtrex daily   ED (erectile dysfunction)    GERD (gastroesophageal reflux disease)    H/O pityriasis rosea     History of basal cell carcinoma (BCC) excision    2022 left calf s/p mohs   History of pertussis 2018   History of SCC (squamous cell carcinoma) of skin    removed from head   History of thrombocytopenia 12/03/2010   Hx of adenomatous polyp of colon 04/07/2017   Hypertension    Mild asthma    followed by pcp   OA (osteoarthritis)    knees, fingers   Prolapsed internal hemorrhoids, grade 3    hx  external hemorroid banding   Wears glasses    Past Surgical History:  Procedure Laterality Date   COLONOSCOPY WITH PROPOFOL  04/01/2017   by dr Carlean Purl   DIRECT LARYNGOSCOPY  1986   left vocal fold bx   (non-cancerous granuloma)   FINGER SURGERY Left 2017   ligament and tendon repair left index finger   HEMORRHOID SURGERY N/A 03/15/2022   Procedure: ANAL EXAM UNDER ANESTHESIA WITH PROCTOSCOPY  AND DEBRIDEMENT;  Surgeon: Leighton Ruff, MD;  Location: [facility];  Service: General;  Laterality: N/A;   INGUINAL HERNIA REPAIR Right 05/25/2018   Procedure: RIGHT INGUINAL HERNIA REPAIR WITH MESH;  Surgeon: Jovita Kussmaul, MD;  Location: [facility];  Service: General;  Laterality: Right;   INSERTION OF MESH Right 05/25/2018   Procedure: INSERTION OF MESH;  Surgeon: Jovita Kussmaul, MD;  Location: [facility];  Service: General;  Laterality: Right;   KNEE ARTHROSCOPY W/ MENISCAL REPAIR Right 10/26/2005   '@[facility]'$   by Dr  Wynelle Link   LAPAROSCOPIC APPENDECTOMY  03/22/2011   '@[facility]'$    LAPAROSCOPY N/A 03/20/2022   Procedure: DIAGNOSTIC LAPAROSCOPY; ABDOMINAL [place] OUT AND DRAIN PLACEMENT; CREATION OF DIVERTING COLOSTOMY; ANAL EXAM UNDER ANESTHESIA;  Surgeon: Leighton Ruff, MD;  Location: [facility];  Service: General;  Laterality: N/A;   MOHS SURGERY  05/2021   left lower leg   TONSILLECTOMY  1958   TRANSANAL HEMORRHOIDAL DEARTERIALIZATION N/A 03/05/2022   Procedure: TRANSANAL HEMORRHOIDAL DEARTERIALIZATION;  Surgeon: Leighton Ruff, MD;  Location: [facility];  Service:  General;  Laterality: N/A;   Family History  Problem Relation Age of Onset   AAA (abdominal aortic aneurysm) Mother    Glaucoma Sister    Stomach cancer Paternal Grandfather    Colon cancer Neg Hx    Social History:  reports that he quit smoking about 33 years ago. His smoking use included cigarettes. He has never used smokeless tobacco. He reports current alcohol use of about 28.0 standard drinks per week. He reports that he does not use drugs. Allergies:  Allergies  Allergen Reactions   Fluconazole Hives, Rash and Other (See Comments)    Shingles activated    Griseofulvin Anaphylaxis, Swelling, Rash and Other (See Comments)    Throat swelling     Penicillins Rash and Other (See Comments)    ?fever, tolerates Cefepime TOLERATING ZOSYN [id number]   Sulfa Antibiotics Other (See Comments)    Joints ache, swell and caused inflammation    Oxycodone Nausea Only and Other (See Comments)    Nauseous to the point of almost vomiting   Mometasone Furo-Formoterol Fum Other (See Comments)    Lack of therapeutic effect    Retapamulin Rash  Medications Prior to Admission  Medication Sig Dispense Refill   albuterol (VENTOLIN HFA) 108 (90 Base) MCG/ACT inhaler Inhale 2 puffs into the lungs every 6 (six) hours as needed for wheezing or shortness of breath.     amLODipine (NORVASC) 5 MG tablet TAKE 1 TABLET BY MOUTH DAILY (Patient taking differently: Take 5 mg by mouth in the morning.) 90 tablet 1   azelastine (ASTELIN) 0.1 % nasal spray Place 1 spray into both nostrils 2 (two) times daily as needed for rhinitis. Use in each nostril as directed     bisoprolol (ZEBETA) 10 MG tablet Take 10 mg by mouth at bedtime.     meloxicam (MOBIC) 15 MG tablet Take 15 mg by mouth daily as needed for pain.  1   montelukast (SINGULAIR) 10 MG tablet TAKE 1 TABLET BY MOUTH EVERY NIGHT AT BEDTIME (Patient taking differently: Take 10 mg by mouth at bedtime.) 90 tablet 3   NEXIUM 24HR 20 MG capsule Take 20 mg by  mouth daily before breakfast.     oxyCODONE (OXY IR/ROXICODONE) 5 MG immediate release tablet Take 1-2 tablets (5-10 mg total) by mouth every 6 (six) hours as needed for severe pain. 30 tablet 0   tadalafil (CIALIS) 10 MG tablet Take 10 mg by mouth daily as needed for erectile dysfunction.     valACYclovir (VALTREX) 500 MG tablet Take 500 mg by mouth in the morning.        Home: Home Living Family/patient expects to be discharged to:: Inpatient rehab Living Arrangements: Spouse/significant other Available Help at Discharge: Family Type of Home: House Home Access: Stairs to enter Technical brewer of Steps: 3 Home Equipment: None   Functional History: Prior Function Prior Level of Function : Independent/Modified Independent  Functional Status:  Mobility: Bed Mobility Overal bed mobility: Needs Assistance Bed Mobility: Sit to Supine Rolling: Supervision Sidelying to sit: Min guard Supine to sit: Min assist, HOB elevated Sit to supine: Min guard General bed mobility comments: Min guard to assist with trunk position in bed, but pt able to return to supine, controlling trunk and LEs with supervision. Transfers Overall transfer level: Needs assistance Equipment used: Rolling walker (2 wheels) Transfers: Sit to/from Stand Sit to Stand: Min guard Bed to/from chair/wheelchair/BSC transfer type:: Stand pivot Stand pivot transfers: Min assist, From elevated surface Step pivot transfers: Min assist General transfer comment: min guard to power to stand, VC for hand placement, denies dizziness or lightheadedness, BUE assisting to power up Ambulation/Gait Ambulation/Gait assistance: Min assist Gait Distance (Feet): 5 Feet (x4) Assistive device: Rolling walker (2 wheels) Gait Pattern/deviations: Step-to pattern, Trunk flexed, Decreased stride length General Gait Details: step-to pattern with flexed trunk, slow steps and slightly shaky, denies dizziness or lightheadedness, fatigues  with ambulation and limited by wound draining Gait velocity: decreased    ADL: ADL Overall ADL's : Needs assistance/impaired Eating/Feeding: Set up, Sitting Grooming: Standing, Oral care, Set up, Min guard Grooming Details (indicate cue type and reason): Pt stood at sink with Min gaurd assist for steadying and full setup for grooming tasks as pt required unilateral UE support on RW to fee safe. Chair placed behind pt in case of dizziness and pt did require a sitting rest directly after toothbrushing.  After pt took ~4 steps from EOB-->sink with RW and Min As, stood for 1 grooming task and lowered to chair, pt stated a RPE score of 8/10, suggesting near maximum effort. Upper Body Bathing: Sitting, Minimal assistance Upper Body Bathing Details (indicate cue type  and reason): sitting EOB Lower Body Bathing: Moderate assistance, Sit to/from stand, Cueing for compensatory techniques Lower Body Bathing Details (indicate cue type and reason): Pt unable to sit in figure 4 position or lean foward at this time.  Stretching to hip joint in figure 4 completed in prep for LE dressing. Pt may benefit from adaptive eqiupment. Upper Body Dressing : Minimal assistance, Sitting Lower Body Dressing: Maximal assistance, Sit to/from stand Lower Body Dressing Details (indicate cue type and reason): min assist for sit to stand from elevated bed. Total assist for shoes and socks at this time due to fatigue and inablility to access feet due to pain. Toilet Transfer: Minimal assistance, Ambulation Toilet Transfer Details (indicate cue type and reason): Pt stood from EOB x 1 to RW and stood from recliner x 2 to RW all with Min As to power up. Pt ambulated ~4' with RW to sink with Min As. Pt performed stand pivot from recliner back to EOB with Min As and RW. Toileting- Clothing Manipulation and Hygiene: Total assistance Functional mobility during ADLs: Minimal assistance, Rolling walker (2 wheels) General ADL Comments: Pt  overall doing UE adls with min assist to set up and LE adls with mod to max assist. Pt very dizzy when standing today and was orthostatic.  See assessment section for BPs.  Cognition: Cognition Overall Cognitive Status: Within Functional Limits for tasks assessed Orientation Level: Oriented X4 Cognition Arousal/Alertness: Awake/alert Behavior During Therapy: WFL for tasks assessed/performed Overall Cognitive Status: Within Functional Limits for tasks assessed General Comments: Very pleasant and motivated  Physical Exam: Blood pressure 129/80, pulse 88, temperature 98 F (36.7 C), resp. rate 18, height '5\' 9"'$  (1.753 m), weight 74.7 kg, SpO2 97 %. Gen: no distress, normal appearing HEENT: oral mucosa pink and moist, NCAT Cardio: Reg rate Chest: normal effort, normal rate of breathing Abdominal:     Comments: Colostomy is viable with stool and air  Skin: pelvis JP in place, RP former drain site erythematous Psych: anxious about CIR, pleasant and with appropriate affect Neurological:     Comments: Patient is alert.  Sitting up in bed.  Oriented x3 and follows commands. No focal neurologic deficits  Results for orders placed or performed during the hospital encounter of 03/09/22 (from the past 48 hour(s))  Hepatic function panel     Status: Abnormal   Collection Time: 04/08/22  5:02 AM  Result Value Ref Range   Total Protein 6.6 6.5 - 8.1 g/dL   Albumin 2.0 (L) 3.5 - 5.0 g/dL   AST 26 15 - 41 U/L   ALT 33 0 - 44 U/L   Alkaline Phosphatase 357 (H) 38 - 126 U/L   Total Bilirubin 0.8 0.3 - 1.2 mg/dL   Bilirubin, Direct 0.2 0.0 - 0.2 mg/dL   Indirect Bilirubin 0.6 0.3 - 0.9 mg/dL    Comment: Performed at [facility], [place] [address], [place], [place] [address]  CBC     Status: Abnormal   Collection Time: 04/09/22  2:43 AM  Result Value Ref Range   WBC 5.3 4.0 - 10.5 K/uL   RBC 2.53 (L) 4.22 - 5.81 MIL/uL   Hemoglobin 8.0 (L) 13.0 - 17.0 g/dL   HCT 23.9 (L) 39.0 -  52.0 %   MCV 94.5 80.0 - 100.0 fL   MCH 31.6 26.0 - 34.0 pg   MCHC 33.5 30.0 - 36.0 g/dL   RDW 14.8 11.5 - 15.5 %   Platelets 311 150 - 400 K/uL  nRBC 0.0 0.0 - 0.2 %    Comment: Performed at [facility], [place] [address], [place], [place] [address]   [place] EKG SITE RITE  Result Date: 04/07/2022 If [facility] image not attached, placement could not be confirmed due to current cardiac rhythm.     Blood pressure 129/80, pulse 88, temperature 98 F (36.7 C), resp. rate 18, height '5\' 9"'$  (1.753 m), weight 74.7 kg, SpO2 97 %.  Medical Problem List and Plan: 1. Functional deficits secondary to debility secondary to pelvic abscess.  Status post laparoscopy, abdominal washout drain placement with creation of diverting colostomy 03/20/2022  -patient may shower but incisions/drains should be covered  -ELOS/Goals: 10-14 days modI  -Admit to CIR 2.  Antithrombotics: -DVT/anticoagulation:  Pharmaceutical: Lovenox.  Venous Doppler negative  -antiplatelet therapy: N/A 3. Pain Management: Robaxin 1000 mg 3 times daily, tramadol as needed 4. Mood: Ativan as needed as well as trazodone  -antipsychotic agents: N/A 5. Neuropsych: This patient is capable of making decisions on his own behalf. 6. Skin/Wound Care: Routine skin checks with colostomy education 7. Fluids/Electrolytes/Nutrition: Routine in and outs with follow-up chemistries 8.  ID/intra-abdominal infection.  Continue micafungin 100 mg daily as well as Zosyn 3.375 g every 8 hours.  Plan is for 6 weeks starting date 5/16 ending 05/11/2022 and will need repeat CT abdomen/pelvis on left thigh in 1 to 2 weeks 9.  Postoperative anemia.  Continue iron supplement.  Follow-up CBC 10.  Hypertension.  Continue Zebeta 10 mg nightly,   Monitor with increased mobility 11.  Decreased nutritional storage.  Dietary follow-up. Continue Prosource.  12.  Urinary retention.  Foley tube removed and urinating.  Check PVR  I have personally performed  a face to face diagnostic evaluation, including, but not limited to relevant history and physical exam findings, of this patient and developed relevant assessment and plan.  Additionally, I have reviewed and concur with the physician assistant's documentation above.  Leeroy Cha, MD   Tyler Paganini Grover, PA-C 04/09/2022

## 2022-04-06 NOTE — Progress Notes (Signed)
17 Days Post-Op ex lap, ostomy creation and drain placement Subjective: Pain better with tramadol. Having trouble sleeping due to back pain.    Objective: Vital signs in last 24 hours: Temp:  [98 F (36.7 C)-98.2 F (36.8 C)] 98.2 F (36.8 C) (05/22 2123) Pulse Rate:  [89-103] 103 (05/22 2123) Resp:  [17-18] 18 (05/22 2123) BP: (133)/(83-95) 133/95 (05/22 2123) SpO2:  [98 %-99 %] 99 % (05/22 2123)   Intake/Output from previous day: 05/22 0701 - 05/23 0700 In: 1164.2 [P.O.:480; IV Piggyback:684.2] Out: 2540 [Urine:2450; Drains:90] Intake/Output this shift: No intake/output data recorded.   General appearance: alert and cooperative GI: soft, non-distended, less TTP Pelvis JP with purulent fluid RP drain out, drain site erythematous but draining appropriately Sub Q with cloudy, purulent fluid dressings in place Colostomy viable with stool and air  Lab Results:  Recent Labs    04/04/22 0502 04/05/22 0432  WBC 6.7 7.2  HGB 8.7* 8.7*  HCT 25.8* 26.8*  PLT 417* 410*    BMET Recent Labs    04/04/22 0502 04/04/22 2333  NA 138 134*  K 3.5 3.6  CL 108 107  CO2 24 21*  GLUCOSE 98 113*  BUN 25* 24*  CREATININE 0.93 0.91  CALCIUM 8.2* 7.7*    PT/INR No results for input(s): LABPROT, INR in the last 72 hours.  ABG No results for input(s): PHART, HCO3 in the last 72 hours.  Invalid input(s): PCO2, PO2   MEDS, Scheduled  (feeding supplement) PROSource Plus  30 mL Oral TID PC & HS   acetaminophen  1,000 mg Oral Q6H   vitamin C  500 mg Oral BID   chlorhexidine  15 mL Mouth Rinse BID   Chlorhexidine Gluconate Cloth  6 each Topical Q0600   enoxaparin (LOVENOX) injection  40 mg Subcutaneous Q24H   famotidine  20 mg Oral BID   feeding supplement  237 mL Oral TID BM   ferrous sulfate  325 mg Oral BID WC   lip balm  1 application. Topical BID   loperamide  2 mg Oral TID with meals   mouth rinse  15 mL Mouth Rinse q12n4p   methocarbamol  500 mg Oral QID    metoprolol tartrate  12.5 mg Oral BID   multivitamin with minerals  1 tablet Oral Daily   nystatin   Topical BID   pantoprazole  40 mg Oral Daily   polycarbophil  625 mg Oral BID   potassium chloride  40 mEq Oral Daily   sodium chloride flush  10-40 mL Intracatheter Q12H    Studies/Results: CT ABDOMEN PELVIS W CONTRAST  Result Date: 04/05/2022 CLINICAL DATA:  Intra-abdominal abscess. Recent diverting colostomy. EXAM: CT ABDOMEN AND PELVIS WITH CONTRAST TECHNIQUE: Multidetector CT imaging of the abdomen and pelvis was performed using the standard protocol following bolus administration of intravenous contrast. RADIATION DOSE REDUCTION: This exam was performed according to the departmental dose-optimization program which includes automated exposure control, adjustment of the mA and/or kV according to patient size and/or use of iterative reconstruction technique. CONTRAST:  148m OMNIPAQUE IOHEXOL 300 MG/ML  SOLN COMPARISON:  03/28/2022 and earlier FINDINGS: Lower chest: There is minimal atelectasis at the RIGHT lung base. Slight interval improvement in small RIGHT pleural effusion. There has been slight improvement in LEFT pleural effusion and LEFT basilar atelectasis/consolidation. Heart size is normal. Hepatobiliary: No focal liver abnormality is seen. No radiopaque gallstones, biliary dilatation, or pericholecystic inflammatory changes. Pancreas: Unremarkable. No pancreatic ductal dilatation or surrounding inflammatory changes. Spleen:  Normal in size without focal abnormality. Adrenals/Urinary Tract: Normal appearance of the adrenal glands and kidneys. Urinary bladder is decompressed by a Foley catheter. Stomach/Bowel: Stomach and small bowel loops are normal in appearance. Diverting colostomy in the LEFT mid abdomen is again noted. There is mild stranding surrounding the colonic loops and mesentery in the LEFT mid abdomen, similar in appearance to prior study. No evidence for obstructing lesion.  Vascular/Lymphatic: There is atherosclerotic calcification of the abdominal aorta. No retroperitoneal or mesenteric adenopathy. Reproductive: Prostate is unremarkable. Other: No interval change in the appearance of surgical drains. No new fluid collections surrounding any of the surgical drains. There is similar appearance of presacral locules of gas. Similar appearance bilateral pelvic wall gas. There is a possible developing small fluid collection along the LEFT pelvic sidewall measures 5.2 x 2.2 centimeters on image 75 of series 2. Air-fluid collection identified anterior to the LEFT kidney is 4.4 x 2.8 centimeters (image 35 of series 2). Previously, this collection measured 4.8 x 4.0 centimeters Significant improvement in body wall edema. Musculoskeletal: No acute or significant osseous findings. There are persistent low-attenuation small fluid collections in the proximal LEFT thigh, similar in appearance to prior studies. Largest collection is in the LEFT pectineus, measuring 1.9 x 3.3 centimeters on image 84 of series 2. Smaller collections are seen in the LEFT obturator and proximal adductors. (Images 86 of series 2 and 94 series 2. The appearance is similar compared to prior studies. IMPRESSION: 1. Improvement in small bilateral pleural effusions and bibasilar atelectasis/consolidation. 2. Improvement in body wall edema. 3. Smaller air-fluid collection anterior to the LEFT kidney. 4. Similar postoperative and inflammatory changes in the LEFT mid abdomen. 5. Possible developing small fluid collection in the LEFT pelvic sidewall, 5.2 x 2.2 centimeters. 6. Unchanged appearance of multiple surgical drains. No new fluid collections adjacent to the drains. 7. Similar appearance of multiple probable intramuscular abscesses in the proximal LEFT thigh. Electronically Signed   By: Nolon Nations M.D.   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 04/03/2022   Protein-calorie malnutrition, severe ([place]) 04/03/2022   Anxiety associated with depression 04/03/2022   Intra-abdominal abscess ([place]) 04/03/2022   Thigh abscess 04/03/2022   Normal anion gap metabolic acidosis 36/14/4315   Acute blood loss anemia ([facility]) 03/18/2022   Acute urinary retention 03/12/2022   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   Mild persistent asthma 03/12/2022   Abdominal pain 03/09/2022   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/09/2022   Pruritus ani 04/20/2018   Arthritis of both knees 01/13/2018   Hx of adenomatous polyp of colon 04/07/2017   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   Perianal dermatitis 01/14/2016   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/12/2013   History of tobacco use 11/12/2013   Low testosterone 09/19/2013   Sinusitis 12/21/2012   Herpes labialis 10/30/2012   Eczema 10/30/2012   Prolapsed internal hemorrhoids, grade 3, THD ligation 03/07/2022 10/30/2012   Hypertension 04/13/2011   FASCIITIS, PLANTAR 05/14/2010   NEOPLASM, SKIN, UNCERTAIN BEHAVIOR 40/06/6760   Chronic rhinitis 08/16/2008   Rash and other nonspecific skin eruption 07/02/2008   ABNORMAL COAGULATION PROFILE 05/29/2008   Gastroesophageal reflux disease 06/28/2007   PITYRIASIS ROSEA 06/28/2007   Osteoarthritis of knee 06/28/2007   Asthmatic bronchitis 05/11/2007    Pelvic sepsis.  Currently drained and debrided.    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    OR findings 5/6: significant RP infection, drained via L pericolic gutter, presacral infection, drained, anterior abd wall infection, drained.  CT 5/14: L RP fluid collection slightly small, L thigh fluid collection stable  IR aspiration:  5/16, cultures pending  CT 5/22: improving fluid collection, new left sided pelvic collection, thigh fluid unchanged but doesn't appear rim enhancing to me  Plan: Nutrition: Cont protein supplements.  TPN off Prealbumin good  ID: Cont IV antibiotics per ID.   PICC removed.  Most   Pain: Cont scheduled tylenol, tramadol, schedule robaxin, dilaudid prn   GI: Scheduled Imodium for diarrhea  Anxiety: ativan PRN  Cont SCD's, Lovenox SQ  dressing changes q shift  Will try to d/c foley again today.  Will need to monitor uop closely     LOS: 27 days     Rosario Adie, MD [facility], [place] Pager: [phone]   04/06/2022 8:22 AM

## 2022-04-06 NOTE — Progress Notes (Signed)
Inpatient Rehab Admissions Coordinator:   Received insurance approval for CIR admit.  Discussed with Dr. Marcello Moores while she was at pt bedside this AM and she anticipates he should be ready to transfer to CIR towards the end of the week.  I will continue to follow.    Shann Medal, PT, DPT Admissions Coordinator [phone] 04/06/22  10:58 AM

## 2022-04-06 NOTE — Progress Notes (Signed)
PT Cancellation Note  Patient Details Name: Tyler [name] MRN: [id number] DOB: December 10, 1950   Cancelled Treatment:    Reason Eval/Treat Not Completed: Fatigue/lethargy limiting ability to participate;Other (comment). Pt fatigued from OT session earlier this morning, motivated to get to recliner but requests to do at later time. Return to room at later time and pt already in recliner with assist from nursing. Reports wanting to each lunch and requests therapy return tomorrow. Will continue to follow.    Tori Azai Gaffin PT, DPT 04/06/22, 1:10 PM

## 2022-04-06 NOTE — Progress Notes (Signed)
Calorie Count Note  48 hour calorie count ordered.  Diet: Regular Supplements:  -Ensure Enlive po TID, each supplement provides 350 kcal and 20 grams of protein.  -Prosource Plus PO QID, each provides 100 kcals and 15g protein   5/22: Breakfast: 335 kcals, 1g protein Lunch: 85 kcals, 2g protein Dinner: 280 kcals, 14g protein Supplements: 4 Prosource Plus (400 kcals, 60g protein)  Total intake: 1100 kcal (45% of minimum estimated needs)  79g protein (63% of minimum estimated needs)  **Pt did not consume any Ensures yesterday. Will need to drink Ensure in order to meet needs. Pt states he does like them.   Nutrition Dx: Increased nutrient needs related to acute illness, post-op healing as evidenced by estimated needs.  Goal: Pt to meet >/= 90% of their estimated nutrition needs   Intervention:  -Continue Ensure Plus High Protein TID -Continue Prosource Plus QID   Clayton Bibles, MS, RD, LDN Inpatient Clinical Dietitian Contact information available via Amion

## 2022-04-06 NOTE — Progress Notes (Signed)
Called answering service for surgery to update Dr. Marcello Moores that patient pulled JP drain at 0600.

## 2022-04-07 ENCOUNTER — Inpatient Hospital Stay: Payer: Self-pay

## 2022-04-07 LAB — CBC
HCT: 24.8 % — ABNORMAL LOW (ref 39.0–52.0)
Hemoglobin: 8.6 g/dL — ABNORMAL LOW (ref 13.0–17.0)
MCH: 32.3 pg (ref 26.0–34.0)
MCHC: 34.7 g/dL (ref 30.0–36.0)
MCV: 93.2 fL (ref 80.0–100.0)
Platelets: 329 10*3/uL (ref 150–400)
RBC: 2.66 MIL/uL — ABNORMAL LOW (ref 4.22–5.81)
RDW: 14.6 % (ref 11.5–15.5)
WBC: 8.4 10*3/uL (ref 4.0–10.5)
nRBC: 0 % (ref 0.0–0.2)

## 2022-04-07 MED ORDER — BISOPROLOL FUMARATE 5 MG PO TABS
10.0000 mg | ORAL_TABLET | Freq: Every day | ORAL | Status: DC
  Administered 2022-04-07 – 2022-04-08 (×2): 10 mg via ORAL
  Filled 2022-04-07 (×2): qty 2

## 2022-04-07 MED ORDER — PANTOPRAZOLE SODIUM 40 MG PO TBEC
40.0000 mg | DELAYED_RELEASE_TABLET | Freq: Every day | ORAL | Status: DC
  Administered 2022-04-08 – 2022-04-09 (×2): 40 mg via ORAL
  Filled 2022-04-07 (×2): qty 1

## 2022-04-07 MED ORDER — METOPROLOL TARTRATE 25 MG PO TABS
25.0000 mg | ORAL_TABLET | Freq: Two times a day (BID) | ORAL | Status: DC
  Administered 2022-04-07: 25 mg via ORAL
  Filled 2022-04-07: qty 1

## 2022-04-07 NOTE — Progress Notes (Addendum)
Physical Therapy Treatment Patient Details Name: NAYEF COLLEGE MRN: [id number] DOB: Jun 10, 1951 Today's Date: 04/07/2022   History of Present Illness 71 yo male former smoker had transanal hemorrhoidal dearterialization on 4/21.  Presented to [facility] ER on 4/25 with nausea, fever, and abdominal pain.  Repeat CT abdomen/pelvis showed possible perforation.  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 He was taken to the OR on 5/06 and had diagnostic laparotomy, abdominal wash out, drain placement and diverting colostomy.  5/14 PCCM reconsulted for tachycardia and possible sepsis.  MRI Lt femur negative for abscess but ultrasound positive for fluid collection aspirated by IR    PT Comments    Pt with improvement in pain tolerance this session, motivated to ambulate. Pt ambulates 5 ft x4 with RW, min A to steady, flexed trunk and shakiness noted, cued for upright posture and breathing while ambulating. Pt requires seated rest breaks between bouts due to fatigue. Transfer training to encourage powering up to stand, BUE assist, exhale and quad/glute engagement to improve technique and independence. Pt able to demo supine to sit through log roll, VC for hand placement and exhalation. Pt progressing with ambulation tolerance, limited by pain and wound drainage, but motivated to continue progressing as able. Pt tolerates remaining up in chair at EOS.    Recommendations for follow up therapy are one component of a multi-disciplinary discharge planning process, led by the attending physician.  Recommendations may be updated based on patient status, additional functional criteria and insurance authorization.  Follow Up Recommendations  Acute inpatient rehab (3hours/day)     Assistance Recommended at Discharge Frequent or constant Supervision/Assistance  Patient can return home with the following A lot of help with walking and/or transfers;A lot of  help with bathing/dressing/bathroom;Assistance with cooking/housework;Direct supervision/assist for medications management;Assist for transportation;Help with stairs or ramp for entrance   Equipment Recommendations  None recommended by PT    Recommendations for Other Services Rehab consult     Precautions / Restrictions Precautions Precautions: Fall Precaution Comments: drains, colostomy, rectal drainage Restrictions Weight Bearing Restrictions: No     Mobility  Bed Mobility Overal bed mobility: Needs Assistance Bed Mobility: Rolling, Sidelying to Sit Rolling: Supervision Sidelying to sit: Min guard  General bed mobility comments: supv to roll into sidelying and min guard to power up into sitting, VC for hand placement and for exhalation    Transfers Overall transfer level: Needs assistance Equipment used: Rolling walker (2 wheels) Transfers: Sit to/from Stand Sit to Stand: Min guard  General transfer comment: min guard to power to stand, VC for hand placement, denies dizziness or lightheadedness, BUE assisting to power up    Ambulation/Gait Ambulation/Gait assistance: Min assist Gait Distance (Feet): 5 Feet (x4) Assistive device: Rolling walker (2 wheels) Gait Pattern/deviations: Step-to pattern, Trunk flexed, Decreased stride length Gait velocity: decreased  General Gait Details: step-to pattern with flexed trunk, slow steps and slightly shaky, denies dizziness or lightheadedness, fatigues with ambulation and limited by wound draining   Stairs             Wheelchair Mobility    Modified Rankin (Stroke Patients Only)       Balance Overall balance assessment: Needs assistance Sitting-balance support: No upper extremity supported Sitting balance-Leahy Scale: Fair Sitting balance - Comments: fatigues easily   Standing balance support: During functional activity, Reliant on assistive device for balance, Bilateral upper extremity supported Standing  balance-Leahy Scale:  Poor Standing balance comment: reliant on walker     Cognition Arousal/Alertness: Awake/alert Behavior During Therapy: WFL for tasks assessed/performed Overall Cognitive Status: Within Functional Limits for tasks assessed     Exercises      General Comments General comments (skin integrity, edema, etc.): Pt denies dizziness, lightheadedness with mobility; slight increase in pain with mobility      Pertinent Vitals/Pain Pain Assessment Pain Assessment: Faces Faces Pain Scale: Hurts a little bit Pain Location: abdomen Pain Descriptors / Indicators: Grimacing, Tender Pain Intervention(s): Limited activity within patient's tolerance, Monitored during session, Repositioned    Home Living                          Prior Function            PT Goals (current goals can now be found in the care plan section) Acute Rehab PT Goals Patient Stated Goal: get back strength and independence PT Goal Formulation: With patient Time For Goal Achievement: 04/14/22 Potential to Achieve Goals: Good Progress towards PT goals: Progressing toward goals    Frequency    Min 4X/week      PT Plan Current plan remains appropriate    Co-evaluation              AM-PAC PT "6 Clicks" Mobility   Outcome Measure  Help needed turning from your back to your side while in a flat bed without using bedrails?: A Little Help needed moving from lying on your back to sitting on the side of a flat bed without using bedrails?: A Little Help needed moving to and from a bed to a chair (including a wheelchair)?: A Lot Help needed standing up from a chair using your arms (e.g., wheelchair or bedside chair)?: A Little Help needed to walk in hospital room?: A Lot Help needed climbing 3-5 steps with a railing? : Total 6 Click Score: 14    End of Session Equipment Utilized During Treatment: Gait belt Activity Tolerance: Patient limited by fatigue;Patient limited by  pain Patient left: in chair;with call bell/phone within reach Nurse Communication: Mobility status PT Visit Diagnosis: Muscle weakness (generalized) (M62.81);Difficulty in walking, not elsewhere classified (R26.2);Unsteadiness on feet (R26.81)     Time: 9758-8325 PT Time Calculation (min) (ACUTE ONLY): 27 min  Charges:  $Gait Training: 8-22 mins $Therapeutic Activity: 8-22 mins                      Tori Avion Patella PT, DPT 04/07/22, 1:01 PM

## 2022-04-07 NOTE — Progress Notes (Addendum)
   04/07/22 0342  Vitals  Temp 98.8 F (37.1 C)  Temp Source Oral  BP 137/87  MAP (mmHg) 100  BP Location Right Arm  BP Method Automatic  Patient Position (if appropriate) Lying  Pulse Rate (!) 112  Pulse Rate Source Monitor  Resp 15  MEWS COLOR  MEWS Score Color Yellow  Oxygen Therapy  SpO2 97 %  O2 Device Room Air  MEWS Score  MEWS Temp 0  MEWS Systolic 0  MEWS Pulse 2  MEWS RR 0  MEWS LOC 0  MEWS Score 2   Gen Surgery oncall Dr. Ninfa Linden notified via text page that Pt Mews now yellow r/t pulse 112; all other vitals WNL; pt asymptomatic; yellow mews protocol followed.  Pt had refused Lopressor at [facility], stating the doctor told him he shouldn't take it since he got dizzy when standing up.

## 2022-04-07 NOTE — Progress Notes (Signed)
   04/07/22 0543  Vitals  Temp 98.4 F (36.9 C)  Temp Source Oral  BP 120/78  MAP (mmHg) 90  BP Location Left Arm  BP Method Automatic  Pulse Rate 74  Pulse Rate Source Monitor  Resp 18  Oxygen Therapy  SpO2 98 %  MEWS Score  MEWS Temp 0  MEWS Systolic 0  MEWS Pulse 0  MEWS RR 0  MEWS LOC 0  MEWS Score 0  MEWS Score Color Green   Pulse now wnl; Mews Green

## 2022-04-07 NOTE — Progress Notes (Incomplete)
Inpatient Rehabilitation Admission Medication Review by a Pharmacist  A complete drug regimen review was completed for this patient to identify any potential clinically significant medication issues.  High Risk Drug Classes Is patient taking? Indication by Medication  Antipsychotic Yes Compazine- N/V  Anticoagulant Yes Lovenox- VTE prophylaxis  Antibiotic Yes, as an intravenous medication Micafungin, Zosyn, nystatin topical- rectal necrosis and L thigh abscess, topical fungal growth  Opioid Yes Dilaudid- acute severe pain Tramadol- acute mild to moderate pain  Antiplatelet No   Hypoglycemics/insulin No   Vasoactive Medication Yes Vasotec prn- hypertension  Chemotherapy No   Other Yes Protonix, pepcid- GERD Robaxin- muscle spasms Ferrous sulfate- iron deficiency     Type of Medication Issue Identified Description of Issue Recommendation(s)  Drug Interaction(s) (clinically significant)     Duplicate Therapy     Allergy     No Medication Administration End Date     Incorrect Dose     Additional Drug Therapy Needed     Significant med changes from prior encounter (inform family/care partners about these prior to discharge).    Other  PTA meds: Albuterol HFA 2 q6h prn wheezing / SOB Norvasc 5 mg daily Azelastine 0.1% 1 spray each nare bid prn rhinitis Zebeta 10 mg HS Valium 5 mg q8h prn anxiety or muscle spasms Mobic 15 mg qday prn pain Singulair 10 mg bedtime oxyIR 5 mg 1-2 tabs q6h prn severe pain Cialis 10 mg daily prn ED Valtrex 500 mg daily Restart PTA meds when and if necessary during CIR admission or at time of discharge, if warranted    Clinically significant medication issues were identified that warrant physician communication and completion of prescribed/recommended actions by midnight of the next day:  No   Time spent performing this drug regimen review (minutes):  30   Kodi Steil BS, PharmD, BCPS Clinical Pharmacist 04/08/2022 2:17 PM  Contact:  [phone] after 3 PM  "Be curious, not judgmental..." -Jamal Maes

## 2022-04-07 NOTE — Progress Notes (Addendum)
[facility]  Date of Admission:  03/09/2022           Reason for visit: Follow up on intra-abdominal infection   Current antibiotics: Zosyn    ASSESSMENT:    71 y.o. male admitted with:  Intra-abdominal infection: Patient admitted with sepsis following initial transanal hemorrhoidal dearterialization 03/05/2022.  Presented this admission on 4/25 with abdominal pain and fever and taken to the OR 5/1 with findings of necrosis of the anoderm posteriorly on EUA and large right gluteal abscess which was opened and drained.  Repeat CT 5/5 given ongoing sepsis physiology showed significant retroperitoneal contamination.  on 5/6 s/p diverting colostomy and drain placement x3 (2 rlq, 1 left quadrant); OR cultures E. coli, E faecalis, anaerobes.  5/12 CT abd/pelv 5.3 x 3.5 x 7.2 cm fluid collection LUQ as well as intramuscular fluid/edema in the thighs left greater than right concerning for possible cellulitis/myositis; too risky for IR drainage LUQ. 5/14 CT overall stability in the pelvis.  The left upper quadrant fluid collection had decreased in size since the prior study and was felt to be contiguous with the left lower quadrant surgical drain. 5/22 repeat ct a new defined left pelvis wall 5cm fluid collection, with some improvement anterior to left kidney otherwise stable. He accidentally dislodged a left quadrant perc drain on 5/24. Intramuscular left thigh fluid collection: CT 5/14 noted progression of intramuscular fluid collections within the left thigh concerning for intramuscular abscesses; s/p IR aspiration/drainage 5/16. Cultures growing c albican. No drain was placed. Fever had resolved since 5/16 drainage Fevers: Suspect secondary to undrained fluid collections versus possible PICC line as well given that his fever development seems to coincide with PICC placement on 4/28.  Fever curve has improved s/p IR aspiration of the thigh.  Penicillin allergy: Have  discussed at length with patient and partner.  At this time, would rather exclude other ongoing potential causes of fever rather than attribute to Zosyn. He has been tolerating piptazo Fluconazole allergy. Skin rash initial and on rechallenge in the past. Will avoid Protein calorie malnutrition: Hopefully can stay off TPN and remove PICC.  Deconditioning. He reported not able to exert more on pt/ot due to dyspnea/fatigue. He normally takes bisoprolol at home for hypotention; that was hold here. He has tachycardia so metoprolol started this admission. As his blood pressure is fine here, would like to maximize his cardiac response for pt/ot so will hold metoprolol  RECOMMENDATIONS:    Given significant clinical improvement would keep current piptazo and micafungin Holding metoprolol Would repeat ct abd pelv in aound 10 days given a new pelvis wall lesion, and especially him dislodging his left qudrant drain on 5/24 I dont see reason to empirically broaden/change abx at this time I agree a picc is appropriate at this time I discussed this with dr Marcello Moores  I spent more than 50 minute reviewing data/chart, and coordinating care and >50% direct face to face time providing counseling/discussing diagnostics/treatment plan with patient      Principal Problem:   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 Active Problems:   Chronic rhinitis   Asthmatic bronchitis   Gastroesophageal reflux disease   Hypertension   Eczema   Prolapsed internal hemorrhoids, grade 3, THD ligation 03/07/2022   Abdominal pain   Acute urinary retention   History of tobacco use   Normal anion gap metabolic acidosis   Acute blood loss anemia (ABLA)   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 Topical BID   loperamide  2 mg Oral TID with meals   mouth rinse  15 mL Mouth Rinse q12n4p   methocarbamol  1,000 mg Oral TID   multivitamin with minerals  1 tablet Oral Daily   nystatin   Topical BID   pantoprazole  40 mg Oral Daily   polycarbophil  625 mg Oral BID   sodium chloride flush  10-40 mL Intracatheter Q12H   Continuous Infusions:  sodium chloride 10 mL/hr at 04/01/22 1200   micafungin [facility]) IV Stopped (04/06/22 1542)   ondansetron (ZOFRAN) IV     piperacillin-tazobactam (ZOSYN)  IV 3.375 g (04/07/22 1023)   PRN Meds:.sodium chloride, albuterol, alum & mag hydroxide-simeth, azelastine, diphenhydrAMINE, enalaprilat, HYDROmorphone (DILAUDID) injection, ibuprofen, LORazepam, magic mouthwash, menthol-cetylpyridinium, ondansetron (ZOFRAN) IV **OR** ondansetron (ZOFRAN) IV, ondansetron **OR** [DISCONTINUED] ondansetron (ZOFRAN) IV, phenol, prochlorperazine, simethicone, sodium chloride flush, traMADol, traZODone  SUBJECTIVE:   24 hour events:  Oob working with pt/ot. Very weak. Not much pain left thigh Left abd drain accidentally dislodged No n/v/fever  Patient with many quesions  Reviewed abd ct from 5/22   Review of Systems  All other systems reviewed and are negative.    OBJECTIVE:   Blood pressure 112/83, pulse (!) 104, temperature 98.7 F (37.1 C), temperature source Oral, resp. rate 18, height '5\' 9"'$  (1.753 m), weight 88.6 kg, SpO2 100 %. Body mass index is 28.84 kg/m.  Physical exam: General/constitutional: no distress, pleasant HEENT: Normocephalic, PER, Conj Clear, EOMI, Oropharynx clear Neck supple CV:  rrr no mrg Lungs: clear to auscultation, normal respiratory effort Abd: Soft, Nontender -- colostomy functioning; 3 drains present -- 2 in the rlq purulent fluid. The one on the left quadrant was accidentally dislodged today 5/24 Ext: no edema Skin: No Rash Neuro: nonfocal MSK: no peripheral joint swelling/tenderness/warmth. No left thigh swelling/tenderness     Lab Results: Lab Results  Component Value Date   WBC 8.4 04/07/2022   HGB 8.6 (L) 04/07/2022   HCT 24.8 (L) 04/07/2022   MCV 93.2 04/07/2022   PLT 329 04/07/2022    Lab Results  Component Value Date   NA 134 (L) 04/04/2022   K 3.6 04/04/2022   CO2 21 (L) 04/04/2022   GLUCOSE 113 (H) 04/04/2022   BUN 24 (H) 04/04/2022   CREATININE 0.91 04/04/2022   CALCIUM 7.7 (L) 04/04/2022   GFRNONAA >60 04/04/2022   GFRAA  03/23/2022    QUESTIONABLE RESULTS, RECOMMEND RECOLLECT TO VERIFY    Lab Results  Component Value Date   ALT 55 (H) 04/04/2022   AST 51 (H) 04/04/2022   ALKPHOS 436 (H) 04/04/2022   BILITOT 0.8 04/04/2022    No results found for: CRP  No results found for: ESRSEDRATE   I have reviewed the micro and lab results  in [place].  Imaging: Imaging independently reviewed in Epic and incorporated into decision making    5/14 abd pelv ct with contrast 1. Persistent gas and fluid collection anterior to the left kidney, likely contiguous with the area drain by the left lower quadrant surgical drain. Slight interval decrease in size since prior study. 2. Extensive inflammatory changes within the presacral space and along the pelvic sidewall, with numerous surgical drains as above, without significant change since prior study. 3. Interval progression of intramuscular fluid collections within the medial and posterior musculature of the proximal left thigh, concerning for intramuscular abscesses. Largest collection within the left hamstring musculature as above. 4. Left lower quadrant colostomy, with persistent  wall thickening of the distal colon just proximal to the colostomy site. This may be secondary inflammatory change due to the intraperitoneal process, versus primary colitis. 5. Bilateral pleural effusions and dependent atelectasis, left greater than right, with slight progression since prior study. 6.  Aortic Atherosclerosis   5/22 abd pelv ct with contrast 1. Improvement in small bilateral pleural effusions and bibasilar atelectasis/consolidation. 2. Improvement in body wall edema. 3. Smaller air-fluid collection anterior to the LEFT kidney. 4. Similar postoperative and inflammatory changes in the LEFT mid abdomen. 5. Possible developing small fluid collection in the LEFT pelvic sidewall, 5.2 x 2.2 centimeters. 6. Unchanged appearance of multiple surgical drains. No new fluid collections adjacent to the drains. 7. Similar appearance of multiple probable intramuscular abscesses in the proximal LEFT thigh.  Jabier Mutton, [place] [phone]  pager   [phone] cell 04/07/2022, 1:21 PM

## 2022-04-07 NOTE — Progress Notes (Signed)
18 Days Post-Op ex lap, ostomy creation and drain placement Subjective: Pain better with tramadol. Has trouble sitting up due to pain.  Sleeping better.  Foley removed and urinating   Objective: Vital signs in last 24 hours: Temp:  [97.5 F (36.4 C)-99.3 F (37.4 C)] 98.2 F (36.8 C) (05/24 0823) Pulse Rate:  [74-112] 112 (05/24 0823) Resp:  [15-20] 20 (05/24 0823) BP: (120-140)/(78-99) 136/89 (05/24 0823) SpO2:  [97 %-99 %] 97 % (05/24 0823)   Intake/Output from previous day: 05/23 0701 - 05/24 0700 In: 949.8 [P.O.:360; IV Piggyback:589.8] Out: 1520 [Urine:1450; Drains:70] Intake/Output this shift: Total I/O In: 320 [P.O.:320] Out: 620 [Urine:500; Stool:120]   General appearance: alert and cooperative GI: soft, non-distended, less TTP Pelvis JP with purulent fluid RP drain out, drain site erythematous but draining appropriately Sub Q with cloudy, purulent fluid dressings in place Colostomy viable with stool and air  Lab Results:  Recent Labs    04/05/22 0432 04/07/22 0503  WBC 7.2 8.4  HGB 8.7* 8.6*  HCT 26.8* 24.8*  PLT 410* 329    BMET Recent Labs    04/04/22 2333  NA 134*  K 3.6  CL 107  CO2 21*  GLUCOSE 113*  BUN 24*  CREATININE 0.91  CALCIUM 7.7*    PT/INR No results for input(s): LABPROT, INR in the last 72 hours.  ABG No results for input(s): PHART, HCO3 in the last 72 hours.  Invalid input(s): PCO2, PO2   MEDS, Scheduled  (feeding supplement) PROSource Plus  30 mL Oral TID PC & HS   acetaminophen  1,000 mg Oral Q6H   vitamin C  500 mg Oral BID   Chlorhexidine Gluconate Cloth  6 each Topical Q0600   enoxaparin (LOVENOX) injection  40 mg Subcutaneous Q24H   famotidine  20 mg Oral BID   feeding supplement  237 mL Oral TID BM   ferrous sulfate  325 mg Oral BID WC   lip balm  1 application. Topical BID   loperamide  2 mg Oral TID with meals   mouth rinse  15 mL Mouth Rinse q12n4p   methocarbamol  1,000 mg Oral TID   metoprolol  tartrate  25 mg Oral BID   multivitamin with minerals  1 tablet Oral Daily   nystatin   Topical BID   pantoprazole  40 mg Oral Daily   polycarbophil  625 mg Oral BID   sodium chloride flush  10-40 mL Intracatheter Q12H    Studies/Results: CT ABDOMEN PELVIS W CONTRAST  Result Date: 04/05/2022 CLINICAL DATA:  Intra-abdominal abscess. Recent diverting colostomy. EXAM: CT ABDOMEN AND PELVIS WITH CONTRAST TECHNIQUE: Multidetector CT imaging of the abdomen and pelvis was performed using the standard protocol following bolus administration of intravenous contrast. RADIATION DOSE REDUCTION: This exam was performed according to the departmental dose-optimization program which includes automated exposure control, adjustment of the mA and/or kV according to patient size and/or use of iterative reconstruction technique. CONTRAST:  151m OMNIPAQUE IOHEXOL 300 MG/ML  SOLN COMPARISON:  03/28/2022 and earlier FINDINGS: Lower chest: There is minimal atelectasis at the RIGHT lung base. Slight interval improvement in small RIGHT pleural effusion. There has been slight improvement in LEFT pleural effusion and LEFT basilar atelectasis/consolidation. Heart size is normal. Hepatobiliary: No focal liver abnormality is seen. No radiopaque gallstones, biliary dilatation, or pericholecystic inflammatory changes. Pancreas: Unremarkable. No pancreatic ductal dilatation or surrounding inflammatory changes. Spleen: Normal in size without focal abnormality. Adrenals/Urinary Tract: Normal appearance of the adrenal glands and kidneys.  Urinary bladder is decompressed by a Foley catheter. Stomach/Bowel: Stomach and small bowel loops are normal in appearance. Diverting colostomy in the LEFT mid abdomen is again noted. There is mild stranding surrounding the colonic loops and mesentery in the LEFT mid abdomen, similar in appearance to prior study. No evidence for obstructing lesion. Vascular/Lymphatic: There is atherosclerotic calcification  of the abdominal aorta. No retroperitoneal or mesenteric adenopathy. Reproductive: Prostate is unremarkable. Other: No interval change in the appearance of surgical drains. No new fluid collections surrounding any of the surgical drains. There is similar appearance of presacral locules of gas. Similar appearance bilateral pelvic wall gas. There is a possible developing small fluid collection along the LEFT pelvic sidewall measures 5.2 x 2.2 centimeters on image 75 of series 2. Air-fluid collection identified anterior to the LEFT kidney is 4.4 x 2.8 centimeters (image 35 of series 2). Previously, this collection measured 4.8 x 4.0 centimeters Significant improvement in body wall edema. Musculoskeletal: No acute or significant osseous findings. There are persistent low-attenuation small fluid collections in the proximal LEFT thigh, similar in appearance to prior studies. Largest collection is in the LEFT pectineus, measuring 1.9 x 3.3 centimeters on image 84 of series 2. Smaller collections are seen in the LEFT obturator and proximal adductors. (Images 86 of series 2 and 94 series 2. The appearance is similar compared to prior studies. IMPRESSION: 1. Improvement in small bilateral pleural effusions and bibasilar atelectasis/consolidation. 2. Improvement in body wall edema. 3. Smaller air-fluid collection anterior to the LEFT kidney. 4. Similar postoperative and inflammatory changes in the LEFT mid abdomen. 5. Possible developing small fluid collection in the LEFT pelvic sidewall, 5.2 x 2.2 centimeters. 6. Unchanged appearance of multiple surgical drains. No new fluid collections adjacent to the drains. 7. Similar appearance of multiple probable intramuscular abscesses in the proximal LEFT thigh. Electronically Signed   By: Nolon Nations M.D.   On: 04/05/2022 16:59   [place] EKG SITE RITE  Result Date: 04/07/2022 If Site Rite image not attached, placement could not be confirmed due to current cardiac rhythm.    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/09/2022   Pruritus ani 04/20/2018   Arthritis of both knees 01/13/2018   Hx of adenomatous polyp of colon 04/07/2017   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   Perianal dermatitis 01/14/2016   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/12/2013   History of tobacco use 11/12/2013   Low testosterone 09/19/2013   Sinusitis 12/21/2012   Herpes labialis 10/30/2012   Eczema 10/30/2012   Prolapsed internal hemorrhoids, grade 3, THD ligation 03/07/2022 10/30/2012   Hypertension 04/13/2011   FASCIITIS, PLANTAR 05/14/2010   NEOPLASM, SKIN, UNCERTAIN BEHAVIOR 52/77/8242   Chronic rhinitis 08/16/2008   Rash and other nonspecific skin eruption 07/02/2008   ABNORMAL COAGULATION PROFILE 05/29/2008   Gastroesophageal reflux disease 06/28/2007   PITYRIASIS ROSEA 06/28/2007   Osteoarthritis of knee 06/28/2007  Asthmatic bronchitis 05/11/2007    Pelvic sepsis.  Currently drained and debrided.    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    OR findings 5/6: significant RP infection, drained via L pericolic gutter, presacral infection, drained, anterior abd wall infection, drained.  CT 5/14:  L RP fluid collection slightly small, L thigh fluid collection stable  IR aspiration: 5/16, cultures pending  CT 5/22: improving fluid collection, new left sided pelvic collection, thigh fluid unchanged but doesn't appear rim enhancing to me  Plan: Nutrition: Cont protein supplements.  TPN off, Prealbumin good  ID: Cont IV antibiotics per ID.   PICC removed.  Plan to cont IV abx for 2 weeks then repeat CT.  Will ask [place] team to replace PICC  Pain: Cont scheduled tylenol, tramadol prn, schedule robaxin, dilaudid prn   GI: Scheduled Imodium for diarrhea  Anxiety: ativan   Cont SCD's, Lovenox SQ  dressing changes q shift  Possible transfer to CIR in the next 1-2 days     LOS: 28 days     Rosario Adie, MD [facility], [place] Pager: [phone]   04/07/2022 10:56 AM

## 2022-04-08 DIAGNOSIS — L02419 Cutaneous abscess of limb, unspecified: Secondary | ICD-10-CM

## 2022-04-08 LAB — HEPATIC FUNCTION PANEL
ALT: 33 U/L (ref 0–44)
AST: 26 U/L (ref 15–41)
Albumin: 2 g/dL — ABNORMAL LOW (ref 3.5–5.0)
Alkaline Phosphatase: 357 U/L — ABNORMAL HIGH (ref 38–126)
Bilirubin, Direct: 0.2 mg/dL (ref 0.0–0.2)
Indirect Bilirubin: 0.6 mg/dL (ref 0.3–0.9)
Total Bilirubin: 0.8 mg/dL (ref 0.3–1.2)
Total Protein: 6.6 g/dL (ref 6.5–8.1)

## 2022-04-08 MED ORDER — SODIUM CHLORIDE 0.9% FLUSH
10.0000 mL | Freq: Two times a day (BID) | INTRAVENOUS | Status: DC
  Administered 2022-04-08 – 2022-04-09 (×2): 10 mL

## 2022-04-08 MED ORDER — SODIUM CHLORIDE 0.9% FLUSH: 10.0000 mL | INTRAVENOUS | Status: DC | PRN

## 2022-04-08 MED ORDER — HYDROMORPHONE HCL 1 MG/ML IJ SOLN: 0.5000 mg | Freq: Four times a day (QID) | INTRAMUSCULAR | Status: DC | PRN

## 2022-04-08 NOTE — Consult Note (Signed)
[place] Nurse ostomy follow up Patient encouraged to participate in care today. Limitations include visual issues reported, and inability to stand unassisted.  Barriers to self care, will discharge to CIR first.  Stoma type/location: LLQ colostomy Stomal assessment/size: 1 1/2", lumen points toward 9 o'clock.  Barrier ring around periphery and extra layer of ring from 7 to 10 o'clock to promote seal.  Peristomal assessment: red, healing.  Pouch was in place for 3 days and significant improvement has occurred.  Scabbed breakdown in abdominal crease at pelvis.  Area is protected with barrier cream.  Treatment options for stomal/peristomal skin: barrier ring, stoma powder and skin prep Output soft brown stool Ostomy pouching: 1pc. Convex, barrier ring, stoma powder and skin prep to skin. Ostomy belt.  Education provided: Patient able to cut barrier opening today with encouragement.  Due to visual challenges while in bed, he observes pouch application and assists in smoothing barrier adhesive. He is able to ventilate pouch and seal closed.  Enrolled patient in [place] Start Discharge program: Yes Will follow.  Domenic Moras MSN, RN, FNP-BC [facility] Wound, Ostomy, Continence Nurse Pager [phone]

## 2022-04-08 NOTE — Plan of Care (Signed)

## 2022-04-08 NOTE — Progress Notes (Signed)
[facility]  Date of Admission:  03/09/2022           Reason for visit: Follow up on intra-abdominal infection   Current antibiotics: Zosyn    ASSESSMENT:    71 y.o. male admitted with:  Intra-abdominal infection: Patient admitted with sepsis following initial transanal hemorrhoidal dearterialization 03/05/2022.  Presented this admission on 4/25 with abdominal pain and fever and taken to the OR 5/1 with findings of necrosis of the anoderm posteriorly on EUA and large right gluteal abscess which was opened and drained.  Repeat CT 5/5 given ongoing sepsis physiology showed significant retroperitoneal contamination.  on 5/6 s/p diverting colostomy and drain placement x3 (2 rlq, 1 left quadrant); OR cultures E. coli, E faecalis, anaerobes.  5/12 CT abd/pelv 5.3 x 3.5 x 7.2 cm fluid collection LUQ as well as intramuscular fluid/edema in the thighs left greater than right concerning for possible cellulitis/myositis; too risky for IR drainage LUQ. 5/14 CT overall stability in the pelvis.  The left upper quadrant fluid collection had decreased in size since the prior study and was felt to be contiguous with the left lower quadrant surgical drain. 5/22 repeat ct a new defined left pelvis wall 5cm fluid collection, with some improvement anterior to left kidney otherwise stable. He accidentally dislodged a left quadrant perc drain on 5/24. Intramuscular left thigh fluid collection: CT 5/14 noted progression of intramuscular fluid collections within the left thigh concerning for intramuscular abscesses; s/p IR aspiration/drainage 5/16. Cultures growing c albican. No drain was placed. Fever had resolved since 5/16 drainage Fevers: Suspect secondary to undrained fluid collections versus possible PICC line as well given that his fever development seems to coincide with PICC placement on 4/28.  Fever curve has improved s/p IR aspiration of the thigh on 5/16 Penicillin allergy: Have  discussed at length with patient and partner.  At this time, would rather exclude other ongoing potential causes of fever rather than attribute to Zosyn. He has been tolerating piptazo Fluconazole allergy. Skin rash initial and on rechallenge in the past. Will avoid Protein calorie malnutrition: Hopefully can stay off TPN and remove PICC.  Deconditioning. He reported not able to exert more on pt/ot due to dyspnea/fatigue. He normally takes bisoprolol at home for hypotention; that was hold here. He has tachycardia so metoprolol started this admission. As his blood pressure is fine here, would like to maximize his cardiac response for pt/ot so will hold metoprolol. Noted team had restarted home bisoprolol 5/25  RECOMMENDATIONS:    Continue pip-tazo and micafungin Plan 6 weeks from 5/16 but will need repeat ct abd/pelv and left thigh in 1-2 weeks (first week of June) and periodically to determine further duration Patient awaiting disposition to inpatient rehab at [facility] From id standpoint, no contraindication to PICC Discussed with primary team   I spent more than 35 minute reviewing data/chart, and coordinating care and >50% direct face to face time providing counseling/discussing diagnostics/treatment plan with patient      Principal Problem:   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 Active Problems:   Chronic rhinitis   Asthmatic bronchitis   Gastroesophageal reflux disease   Hypertension   Eczema   Prolapsed internal hemorrhoids, grade 3, THD ligation 03/07/2022   Abdominal pain   Acute urinary retention   History of tobacco use   Normal anion gap metabolic acidosis   Acute blood loss anemia (ABLA)   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 Topical BID   loperamide  2 mg Oral TID with meals   mouth rinse  15 mL Mouth Rinse q12n4p   methocarbamol  1,000 mg Oral TID   multivitamin with minerals  1 tablet Oral Daily   nystatin   Topical BID   pantoprazole  40 mg Oral Daily   polycarbophil  625 mg Oral BID   sodium chloride flush  10-40 mL Intracatheter Q12H   Continuous Infusions:  sodium chloride 10 mL/hr at 04/01/22 1200   micafungin [facility]) IV 100 mg (04/08/22 1336)   ondansetron (ZOFRAN) IV     piperacillin-tazobactam (ZOSYN)  IV 3.375 g (04/08/22 0839)   PRN Meds:.sodium chloride, albuterol, alum & mag hydroxide-simeth, azelastine, diphenhydrAMINE, HYDROmorphone (DILAUDID) injection, ibuprofen, LORazepam, magic mouthwash, ondansetron (ZOFRAN) IV **OR** ondansetron (ZOFRAN) IV, ondansetron **OR** [DISCONTINUED] ondansetron (ZOFRAN) IV, phenol, prochlorperazine, simethicone, sodium chloride flush, traMADol, traZODone  SUBJECTIVE:   24 hour events:  Still struggle to do pt/ot and getting to bathroom. Dizziness seems to be his main complaint No n/v/increased osteomy output No rash No cough/chest pain  Bisprolol restarted   Review of Systems  All other systems reviewed and are negative.    OBJECTIVE:   Blood pressure (!) 143/75, pulse 99, temperature 99.9 F (37.7 C), temperature source Oral, resp. rate 18, height '5\' 9"'$  (1.753 m), weight 74.7 kg, SpO2 100 %. Body mass index is 24.32 kg/m.  Physical exam:  General/constitutional: no distress, pleasant HEENT: Normocephalic, PER, Conj Clear, EOMI, Oropharynx clear Neck supple CV:  rrr no mrg Lungs: clear to auscultation, normal respiratory effort Abd: Soft, Nontender -- colostomy functioning; 2 drains RLQ present still with purulent material Neuro: nonfocal -- generalized weakness especially bilateral LE MSK: no peripheral joint swelling/tenderness/warmth. No left thigh swelling/tenderness     Lab Results: Lab Results  Component Value Date   WBC 8.4 04/07/2022   HGB 8.6 (L) 04/07/2022   HCT 24.8 (L) 04/07/2022   MCV 93.2 04/07/2022   PLT 329 04/07/2022    Lab Results  Component Value Date   NA 134 (L) 04/04/2022   K 3.6 04/04/2022   CO2 21 (L) 04/04/2022   GLUCOSE 113 (H) 04/04/2022   BUN 24 (H) 04/04/2022   CREATININE 0.91 04/04/2022   CALCIUM 7.7 (L) 04/04/2022   GFRNONAA >60 04/04/2022   GFRAA  03/23/2022    QUESTIONABLE RESULTS, RECOMMEND RECOLLECT TO VERIFY    Lab Results  Component Value Date   ALT 33 04/08/2022   AST 26 04/08/2022   ALKPHOS 357 (H) 04/08/2022   BILITOT 0.8 04/08/2022    No results found for: CRP  No results found for: ESRSEDRATE   I have reviewed the micro and lab results in Epic.  Imaging: Imaging independently reviewed in Epic and incorporated into decision  making    5/14 abd pelv ct with contrast 1. Persistent gas and fluid collection anterior to the left kidney, likely contiguous with the area drain by the left lower quadrant surgical drain. Slight interval decrease in size since prior study. 2. Extensive inflammatory changes within the presacral space and along the pelvic sidewall, with numerous surgical drains as above, without significant change since prior study. 3. Interval progression of intramuscular fluid collections within the medial and posterior musculature of the proximal left thigh, concerning for intramuscular abscesses. Largest collection within the left hamstring musculature as above. 4. Left lower quadrant colostomy, with persistent wall thickening of the distal colon just proximal to  the colostomy site. This may be secondary inflammatory change due to the intraperitoneal process, versus primary colitis. 5. Bilateral pleural effusions and dependent atelectasis, left greater than right, with slight progression since prior study. 6.  Aortic Atherosclerosis   5/22 abd pelv ct with contrast 1. Improvement in small bilateral pleural effusions and bibasilar atelectasis/consolidation. 2. Improvement in body wall edema. 3. Smaller air-fluid collection anterior to the LEFT kidney. 4. Similar postoperative and inflammatory changes in the LEFT mid abdomen. 5. Possible developing small fluid collection in the LEFT pelvic sidewall, 5.2 x 2.2 centimeters. 6. Unchanged appearance of multiple surgical drains. No new fluid collections adjacent to the drains. 7. Similar appearance of multiple probable intramuscular abscesses in the proximal LEFT thigh.  Jabier Mutton, [place] [facility] [phone]  pager   [phone] cell 04/08/2022, 1:59 PM

## 2022-04-08 NOTE — Progress Notes (Signed)
Occupational Therapy Treatment Patient Details Name: Tyler [name] MRN: [id number] DOB: September 18, 1951 Today's Date: 04/08/2022   History of present illness 71 yo male former smoker had transanal hemorrhoidal dearterialization on 4/21.  Presented to [facility] ER on 4/25 with nausea, fever, and abdominal pain.  Repeat CT abdomen/pelvis showed possible perforation.  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 He was taken to the OR on 5/06 and had diagnostic laparotomy, abdominal wash out, drain placement and diverting colostomy.  5/14 PCCM reconsulted for tachycardia and possible sepsis.  MRI Lt femur negative for abscess but ultrasound positive for fluid collection aspirated by IR   OT comments  Patient progressing and showed improved tolerance to standing ADLs compared to previous session when pt required a seated position.  Pt requires at least one hand on RW to feel safe with standing with Min guard to steady as needed, and Pt does still endorse dizziness with BP drop in standing from 122/79 to 95/63, which effects his ability to tolerate prolonged standing.  Patient remains limited by abdominal discomfort, unpredictable rectal leakage with mobility, generalized weakness and decreased activity tolerance along with deficits noted below. Pt continues to demonstrate very good rehab potential and would benefit from continued skilled OT to increase safety and independence with ADLs and functional transfers to allow pt to return home safely and reduce caregiver burden and fall risk.    Recommendations for follow up therapy are one component of a multi-disciplinary discharge planning process, led by the attending physician.  Recommendations may be updated based on patient status, additional functional criteria and insurance authorization.    Follow Up Recommendations  Acute inpatient rehab (3hours/day)    Assistance Recommended at Discharge  Frequent or constant Supervision/Assistance  Patient can return home with the following  A lot of help with bathing/dressing/bathroom;Assistance with cooking/housework;Direct supervision/assist for financial management;Direct supervision/assist for medications management;Help with stairs or ramp for entrance;A little help with walking and/or transfers   Equipment Recommendations  BSC/3in1;Tub/shower seat    Recommendations for Other Services      Precautions / Restrictions Precautions Precautions: Fall Precaution Comments: drains, colostomy, rectal drainage Restrictions Weight Bearing Restrictions: No       Mobility Bed Mobility Overal bed mobility: Needs Assistance Bed Mobility: Sit to Supine       Sit to supine: Min guard   General bed mobility comments: Min guard to assist with trunk position in bed, but pt able to return to supine, controlling trunk and LEs with supervision.    Transfers                         Balance Overall balance assessment: Needs assistance Sitting-balance support: No upper extremity supported Sitting balance-Leahy Scale: Fair Sitting balance - Comments: fatigues easily   Standing balance support: During functional activity, Reliant on assistive device for balance, Single extremity supported Standing balance-Leahy Scale: Poor                             ADL either performed or assessed with clinical judgement   ADL Overall ADL's : Needs assistance/impaired     Grooming: Standing;Oral care;Set up;Min guard Grooming Details (indicate cue type and reason): Pt stood at sink with Min gaurd assist for steadying and full setup for grooming tasks as pt required unilateral UE support on RW to fee  safe. Chair placed behind pt in case of dizziness and pt did require a sitting rest directly after toothbrushing.  After pt took ~4 steps from EOB-->sink with RW and Min As, stood for 1 grooming task and lowered to chair, pt stated a RPE  score of 8/10, suggesting near maximum effort.                 Toilet Transfer: Minimal assistance;Ambulation Toilet Transfer Details (indicate cue type and reason): Pt stood from EOB x 1 to RW and stood from recliner x 2 to RW all with Min As to power up. Pt ambulated ~4' with RW to sink with Min As. Pt performed stand pivot from recliner back to EOB with Min As and RW. Toileting- Clothing Manipulation and Hygiene: Total assistance       Functional mobility during ADLs: Minimal assistance;Rolling walker (2 wheels)      Extremity/Trunk Assessment Upper Extremity Assessment Upper Extremity Assessment: Overall WFL for tasks assessed            Vision       Perception     Praxis      Cognition Arousal/Alertness: Awake/alert Behavior During Therapy: WFL for tasks assessed/performed Overall Cognitive Status: Within Functional Limits for tasks assessed                                 General Comments: Very pleasant and motivated        Exercises      Shoulder Instructions       General Comments      Pertinent Vitals/ Pain       Pain Assessment Pain Assessment: Faces Faces Pain Scale: Hurts a little bit Pain Location: abdomen Pain Descriptors / Indicators: Grimacing Pain Intervention(s): Limited activity within patient's tolerance, Monitored during session, Repositioned  Home Living                                          Prior Functioning/Environment              Frequency  Min 2X/week        Progress Toward Goals  OT Goals(current goals can now be found in the care plan section)  Progress towards OT goals: Progressing toward goals  Acute Rehab OT Goals Patient Stated Goal: Increase standing tolerance. OT Goal Formulation: With patient Time For Goal Achievement: 04/14/22 Potential to Achieve Goals: Good  Plan Discharge plan remains appropriate    Co-evaluation                 AM-PAC OT "6  Clicks" Daily Activity     Outcome Measure   Help from another person eating meals?: None Help from another person taking care of personal grooming?: A Little Help from another person toileting, which includes using toliet, bedpan, or urinal?: Total Help from another person bathing (including washing, rinsing, drying)?: A Lot Help from another person to put on and taking off regular upper body clothing?: A Little Help from another person to put on and taking off regular lower body clothing?: Total 6 Click Score: 14    End of Session Equipment Utilized During Treatment: Rolling walker (2 wheels)  OT Visit Diagnosis: Muscle weakness (generalized) (M62.81);Pain   Activity Tolerance Patient tolerated treatment well   Patient Left in bed;with call bell/phone within reach;with  nursing/sitter in room (RN and Ostomy NP in room for hand off.)   Nurse Communication Mobility status        Time: [phone] OT Time Calculation (min): 43 min  Charges: OT General Charges $OT Visit: 1 Visit OT Treatments $Self Care/Home Management : 8-22 mins $Therapeutic Activity: 23-37 mins  Anderson Malta, OT Acute Rehab Services Office: [phone] 04/08/2022  Julien Girt 04/08/2022, 1:59 PM

## 2022-04-08 NOTE — Progress Notes (Signed)
Peripherally Inserted Central Catheter Placement  The IV Nurse has discussed with the patient and/or persons authorized to consent for the patient, the purpose of this procedure and the potential benefits and risks involved with this procedure.  The benefits include less needle sticks, lab draws from the catheter, and the patient may be discharged home with the catheter. Risks include, but not limited to, infection, bleeding, blood clot (thrombus formation), and puncture of an artery; nerve damage and irregular heartbeat and possibility to perform a PICC exchange if needed/ordered by physician.  Alternatives to this procedure were also discussed.  Bard Power PICC patient education guide, fact sheet on infection prevention and patient information card has been provided to patient /or left at bedside.    PICC Placement Documentation  PICC Single Lumen 04/08/22 Right Brachial 39 cm 0 cm (Active)  Indication for Insertion or Continuance of Line Home intravenous therapies (PICC only) 04/08/22 1500  Exposed Catheter (cm) 0 cm 04/08/22 1500  Site Assessment Clean, Dry, Intact 04/08/22 1500  Line Status Flushed;Blood return noted;Saline locked 04/08/22 1500  Dressing Type Transparent;Securing device 04/08/22 1500  Dressing Status Clean, Dry, Intact;Antimicrobial disc in place 04/08/22 1500  Dressing Change Due 04/15/22 04/08/22 1500       Tyler [name] 04/08/2022, 3:55 PM

## 2022-04-08 NOTE — Progress Notes (Signed)
19 Days Post-Op ex lap, ostomy creation and drain placement Subjective: Still having some back pain. Sat up in a chair and moved around a bit more yesterday.  Still urinating well with occasional incontinence  Objective: Vital signs in last 24 hours: Temp:  [98 F (36.7 C)-99.5 F (37.5 C)] 98.5 F (36.9 C) (05/25 0520) Pulse Rate:  [88-112] 96 (05/25 0520) Resp:  [18-22] 20 (05/25 0520) BP: (112-138)/(79-89) 138/87 (05/25 0520) SpO2:  [97 %-100 %] 100 % (05/25 0520) Weight:  [74.7 kg] 74.7 kg (05/24 2300)   Intake/Output from previous day: 05/24 0701 - 05/25 0700 In: 963.2 [P.O.:560; I.V.:179.2; IV Piggyback:223.9] Out: 1000 [Urine:500; Drains:50; Stool:450] Intake/Output this shift: No intake/output data recorded.   General appearance: alert and cooperative GI: soft, non-distended, less TTP Pelvis JP with purulent fluid RP drain out, drain site erythematous but draining appropriately Sub Q with cloudy, purulent fluid dressings in place Colostomy viable with stool and air  Lab Results:  Recent Labs    04/07/22 0503  WBC 8.4  HGB 8.6*  HCT 24.8*  PLT 329    BMET No results for input(s): NA, K, CL, CO2, GLUCOSE, BUN, CREATININE, CALCIUM in the last 72 hours.  PT/INR No results for input(s): LABPROT, INR in the last 72 hours.  ABG No results for input(s): PHART, HCO3 in the last 72 hours.  Invalid input(s): PCO2, PO2   MEDS, Scheduled  (feeding supplement) PROSource Plus  30 mL Oral TID PC & HS   acetaminophen  1,000 mg Oral Q6H   vitamin C  500 mg Oral BID   bisoprolol  10 mg Oral QHS   Chlorhexidine Gluconate Cloth  6 each Topical Q0600   enoxaparin (LOVENOX) injection  40 mg Subcutaneous Q24H   famotidine  20 mg Oral BID   feeding supplement  237 mL Oral TID BM   ferrous sulfate  325 mg Oral BID WC   lip balm  1 application. Topical BID   loperamide  2 mg Oral TID with meals   mouth rinse  15 mL Mouth Rinse q12n4p   methocarbamol  1,000 mg Oral TID    multivitamin with minerals  1 tablet Oral Daily   nystatin   Topical BID   pantoprazole  40 mg Oral Daily   polycarbophil  625 mg Oral BID   sodium chloride flush  10-40 mL Intracatheter Q12H    Studies/Results: [place] EKG SITE RITE  Result Date: 04/07/2022 If Site Rite image not attached, placement could not be confirmed due to current cardiac rhythm.   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/09/2022   Pruritus ani 04/20/2018   Arthritis of both knees 01/13/2018   Hx of adenomatous polyp of colon 04/07/2017   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   Perianal dermatitis 01/14/2016   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/12/2013   History of tobacco use 11/12/2013  Low testosterone 09/19/2013   Sinusitis 12/21/2012   Herpes labialis 10/30/2012   Eczema 10/30/2012   Prolapsed internal hemorrhoids, grade 3, THD ligation 03/07/2022 10/30/2012   Hypertension 04/13/2011   FASCIITIS, PLANTAR 05/14/2010   NEOPLASM, SKIN, UNCERTAIN BEHAVIOR 08/65/7846   Chronic rhinitis 08/16/2008   Rash and other nonspecific skin eruption 07/02/2008   ABNORMAL COAGULATION PROFILE 05/29/2008   Gastroesophageal reflux disease 06/28/2007   PITYRIASIS ROSEA 06/28/2007   Osteoarthritis of knee 06/28/2007   Asthmatic bronchitis 05/11/2007    Pelvic sepsis.   Currently drained and debrided.    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    OR findings 5/6: significant RP infection, drained via L pericolic gutter, presacral infection, drained, anterior abd wall infection, drained.  CT 5/14: L RP fluid collection slightly small, L thigh fluid collection stable  IR aspiration: 5/16, cultures pending  CT 5/22: improving fluid collection, new left sided small pelvic collection, thigh fluid unchanged but doesn't appear rim enhancing to me  Plan: Nutrition: Cont protein supplements.  TPN off, Prealbumin good  ID: Cont IV antibiotics at current regimen.   Replace PICC.  Plan to cont IV abx for 2 weeks then repeat CT.   Pain: Cont scheduled tylenol, tramadol prn, schedule robaxin, dilaudid prn    GI: Scheduled Imodium for diarrhea  Anxiety: ativan   Cont SCD's, Lovenox SQ  dressing changes q shift  Possible transfer to CIR tomorrow vs Mon.  May be able to remove the anterior drain prior to going to rehab.  Will f/u outputs tom     LOS: 29 days     Rosario Adie, MD [facility], [place] Pager: [phone]   04/08/2022 7:40 AM

## 2022-04-09 ENCOUNTER — Inpatient Hospital Stay (HOSPITAL_COMMUNITY)
Admission: RE | Admit: 2022-04-09 | Discharge: 2022-04-28 | DRG: 945 | Disposition: A | Discharge status: Home Health | Payer: BC Managed Care – PPO | Source: Intra-hospital | Attending: Physical Medicine & Rehabilitation | Admitting: Physical Medicine & Rehabilitation

## 2022-04-09 ENCOUNTER — Other Ambulatory Visit: Payer: Self-pay

## 2022-04-09 ENCOUNTER — Inpatient Hospital Stay (HOSPITAL_COMMUNITY): Payer: BC Managed Care – PPO

## 2022-04-09 DIAGNOSIS — R5381 Other malaise: Principal | ICD-10-CM | POA: Diagnosis present

## 2022-04-09 DIAGNOSIS — I10 Essential (primary) hypertension: Secondary | ICD-10-CM | POA: Diagnosis present

## 2022-04-09 DIAGNOSIS — Z87891 Personal history of nicotine dependence: Secondary | ICD-10-CM

## 2022-04-09 DIAGNOSIS — J45909 Unspecified asthma, uncomplicated: Secondary | ICD-10-CM | POA: Diagnosis present

## 2022-04-09 DIAGNOSIS — Z83511 Family history of glaucoma: Secondary | ICD-10-CM | POA: Diagnosis not present

## 2022-04-09 DIAGNOSIS — E8809 Other disorders of plasma-protein metabolism, not elsewhere classified: Secondary | ICD-10-CM | POA: Diagnosis present

## 2022-04-09 DIAGNOSIS — D72829 Elevated white blood cell count, unspecified: Secondary | ICD-10-CM

## 2022-04-09 DIAGNOSIS — J4520 Mild intermittent asthma, uncomplicated: Secondary | ICD-10-CM | POA: Diagnosis not present

## 2022-04-09 DIAGNOSIS — L89626 Pressure-induced deep tissue damage of left heel: Secondary | ICD-10-CM | POA: Diagnosis present

## 2022-04-09 DIAGNOSIS — B952 Enterococcus as the cause of diseases classified elsewhere: Secondary | ICD-10-CM | POA: Diagnosis present

## 2022-04-09 DIAGNOSIS — B9620 Unspecified Escherichia coli [E. coli] as the cause of diseases classified elsewhere: Secondary | ICD-10-CM | POA: Diagnosis present

## 2022-04-09 DIAGNOSIS — K6811 Postprocedural retroperitoneal abscess: Secondary | ICD-10-CM | POA: Diagnosis present

## 2022-04-09 DIAGNOSIS — A498 Other bacterial infections of unspecified site: Secondary | ICD-10-CM | POA: Diagnosis present

## 2022-04-09 DIAGNOSIS — Z800 Family history of malignant neoplasm of digestive organs: Secondary | ICD-10-CM

## 2022-04-09 DIAGNOSIS — B3789 Other sites of candidiasis: Secondary | ICD-10-CM | POA: Diagnosis present

## 2022-04-09 DIAGNOSIS — E876 Hypokalemia: Secondary | ICD-10-CM | POA: Diagnosis present

## 2022-04-09 DIAGNOSIS — K631 Perforation of intestine (nontraumatic): Secondary | ICD-10-CM

## 2022-04-09 DIAGNOSIS — M25559 Pain in unspecified hip: Secondary | ICD-10-CM

## 2022-04-09 DIAGNOSIS — Z888 Allergy status to other drugs, medicaments and biological substances status: Secondary | ICD-10-CM

## 2022-04-09 DIAGNOSIS — D62 Acute posthemorrhagic anemia: Secondary | ICD-10-CM

## 2022-04-09 DIAGNOSIS — T8143XA Infection following a procedure, organ and space surgical site, initial encounter: Secondary | ICD-10-CM

## 2022-04-09 DIAGNOSIS — F418 Other specified anxiety disorders: Secondary | ICD-10-CM

## 2022-04-09 DIAGNOSIS — A4181 Sepsis due to Enterococcus: Secondary | ICD-10-CM | POA: Diagnosis present

## 2022-04-09 DIAGNOSIS — G4700 Insomnia, unspecified: Secondary | ICD-10-CM | POA: Diagnosis present

## 2022-04-09 DIAGNOSIS — Z880 Allergy status to penicillin: Secondary | ICD-10-CM

## 2022-04-09 DIAGNOSIS — L02416 Cutaneous abscess of left lower limb: Secondary | ICD-10-CM | POA: Diagnosis present

## 2022-04-09 DIAGNOSIS — D649 Anemia, unspecified: Secondary | ICD-10-CM | POA: Diagnosis present

## 2022-04-09 DIAGNOSIS — A419 Sepsis, unspecified organism: Secondary | ICD-10-CM | POA: Diagnosis not present

## 2022-04-09 DIAGNOSIS — L89616 Pressure-induced deep tissue damage of right heel: Secondary | ICD-10-CM | POA: Diagnosis present

## 2022-04-09 DIAGNOSIS — R509 Fever, unspecified: Secondary | ICD-10-CM | POA: Diagnosis not present

## 2022-04-09 DIAGNOSIS — R339 Retention of urine, unspecified: Secondary | ICD-10-CM | POA: Diagnosis present

## 2022-04-09 DIAGNOSIS — R3915 Urgency of urination: Secondary | ICD-10-CM | POA: Diagnosis not present

## 2022-04-09 DIAGNOSIS — Z933 Colostomy status: Secondary | ICD-10-CM

## 2022-04-09 DIAGNOSIS — R52 Pain, unspecified: Secondary | ICD-10-CM | POA: Diagnosis not present

## 2022-04-09 DIAGNOSIS — B9629 Other Escherichia coli [E. coli] as the cause of diseases classified elsewhere: Secondary | ICD-10-CM | POA: Diagnosis not present

## 2022-04-09 DIAGNOSIS — S31809D Unspecified open wound of unspecified buttock, subsequent encounter: Secondary | ICD-10-CM | POA: Diagnosis not present

## 2022-04-09 DIAGNOSIS — D696 Thrombocytopenia, unspecified: Secondary | ICD-10-CM | POA: Diagnosis present

## 2022-04-09 DIAGNOSIS — R338 Other retention of urine: Secondary | ICD-10-CM

## 2022-04-09 DIAGNOSIS — R1010 Upper abdominal pain, unspecified: Secondary | ICD-10-CM

## 2022-04-09 DIAGNOSIS — B379 Candidiasis, unspecified: Secondary | ICD-10-CM

## 2022-04-09 DIAGNOSIS — Z20822 Contact with and (suspected) exposure to covid-19: Secondary | ICD-10-CM | POA: Diagnosis present

## 2022-04-09 DIAGNOSIS — A4151 Sepsis due to Escherichia coli [E. coli]: Secondary | ICD-10-CM | POA: Diagnosis not present

## 2022-04-09 DIAGNOSIS — K659 Peritonitis, unspecified: Secondary | ICD-10-CM | POA: Diagnosis not present

## 2022-04-09 LAB — CBC
HCT: 23.9 % — ABNORMAL LOW (ref 39.0–52.0)
HCT: 24.1 % — ABNORMAL LOW (ref 39.0–52.0)
Hemoglobin: 8 g/dL — ABNORMAL LOW (ref 13.0–17.0)
Hemoglobin: 8.1 g/dL — ABNORMAL LOW (ref 13.0–17.0)
MCH: 31.6 pg (ref 26.0–34.0)
MCH: 31.3 pg (ref 26.0–34.0)
MCHC: 33.5 g/dL (ref 30.0–36.0)
MCHC: 33.6 g/dL (ref 30.0–36.0)
MCV: 94.5 fL (ref 80.0–100.0)
MCV: 93.1 fL (ref 80.0–100.0)
Platelets: 311 10*3/uL (ref 150–400)
Platelets: 296 10*3/uL (ref 150–400)
RBC: 2.53 MIL/uL — ABNORMAL LOW (ref 4.22–5.81)
RBC: 2.59 MIL/uL — ABNORMAL LOW (ref 4.22–5.81)
RDW: 14.8 % (ref 11.5–15.5)
RDW: 14.6 % (ref 11.5–15.5)
WBC: 5.3 10*3/uL (ref 4.0–10.5)
WBC: 4.3 10*3/uL (ref 4.0–10.5)
nRBC: 0 % (ref 0.0–0.2)
nRBC: 0 % (ref 0.0–0.2)

## 2022-04-09 LAB — SARS CORONAVIRUS 2 BY RT PCR: SARS Coronavirus 2 by RT PCR: NEGATIVE

## 2022-04-09 LAB — CREATININE, SERUM
Creatinine, Ser: 1.05 mg/dL (ref 0.61–1.24)
GFR, Estimated: >60 mL/min (ref >60)

## 2022-04-09 MED ORDER — FERROUS SULFATE 325 (65 FE) MG PO TABS
325.0000 mg | ORAL_TABLET | Freq: Two times a day (BID) | ORAL | Status: DC
  Administered 2022-04-09 – 2022-04-28 (×37): 325 mg via ORAL
  Filled 2022-04-09 (×38): qty 1

## 2022-04-09 MED ORDER — ENSURE ENLIVE PO LIQD
237.0000 mL | Freq: Three times a day (TID) | ORAL | Status: DC
Start: 2022-04-09 — End: 2022-04-28
  Administered 2022-04-09 – 2022-04-28 (×41): 237 mL via ORAL

## 2022-04-09 MED ORDER — SODIUM CHLORIDE 0.9% FLUSH: 10.0000 mL | Freq: Two times a day (BID) | INTRAVENOUS | Status: DC

## 2022-04-09 MED ORDER — SODIUM CHLORIDE 0.9 % IV SOLN
100.0000 mg | INTRAVENOUS | Status: DC
  Administered 2022-04-09 – 2022-04-28 (×20): 100 mg via INTRAVENOUS
  Filled 2022-04-09 (×3): qty 5
  Filled 2022-04-09: qty 10
  Filled 2022-04-09 (×3): qty 5
  Filled 2022-04-09: qty 10
  Filled 2022-04-09 (×12): qty 5

## 2022-04-09 MED ORDER — ACETAMINOPHEN 325 MG PO TABS
325.0000 mg | ORAL_TABLET | ORAL | Status: DC | PRN
  Administered 2022-04-10 – 2022-04-25 (×10): 650 mg via ORAL
  Filled 2022-04-09 (×10): qty 2

## 2022-04-09 MED ORDER — SIMETHICONE 40 MG/0.6ML PO SUSP: 80.0000 mg | Freq: Four times a day (QID) | ORAL | Status: DC | PRN

## 2022-04-09 MED ORDER — ENOXAPARIN SODIUM 40 MG/0.4ML IJ SOSY
40.0000 mg | PREFILLED_SYRINGE | INTRAMUSCULAR | Status: DC
  Administered 2022-04-10 – 2022-04-27 (×16): 40 mg via SUBCUTANEOUS
  Filled 2022-04-09 (×18): qty 0.4

## 2022-04-09 MED ORDER — AZELASTINE HCL 0.1 % NA SOLN: 1.0000 | Freq: Two times a day (BID) | NASAL | Status: DC | PRN

## 2022-04-09 MED ORDER — PIPERACILLIN-TAZOBACTAM 3.375 G IVPB
3.3750 g | Freq: Three times a day (TID) | INTRAVENOUS | Status: DC
  Administered 2022-04-09 – 2022-04-14 (×15): 3.375 g via INTRAVENOUS
  Filled 2022-04-09 (×15): qty 50

## 2022-04-09 MED ORDER — VANCOMYCIN HCL IN DEXTROSE 1-5 GM/200ML-% IV SOLN: 1000.0000 mg | Freq: Two times a day (BID) | INTRAVENOUS | Status: DC

## 2022-04-09 MED ORDER — TRAMADOL HCL 50 MG PO TABS
50.0000 mg | ORAL_TABLET | Freq: Four times a day (QID) | ORAL | Status: DC | PRN
  Administered 2022-04-20 – 2022-04-21 (×2): 100 mg via ORAL
  Administered 2022-04-22 (×2): 50 mg via ORAL
  Filled 2022-04-09 (×2): qty 1
  Filled 2022-04-09 (×2): qty 2

## 2022-04-09 MED ORDER — ACETAMINOPHEN 500 MG PO TABS
1000.0000 mg | ORAL_TABLET | Freq: Four times a day (QID) | ORAL | Status: DC | PRN
Start: 2022-04-09 — End: 2022-04-09

## 2022-04-09 MED ORDER — LOPERAMIDE HCL 2 MG PO CAPS
2.0000 mg | ORAL_CAPSULE | Freq: Three times a day (TID) | ORAL | Status: DC
  Administered 2022-04-09 – 2022-04-25 (×35): 2 mg via ORAL
  Filled 2022-04-09 (×47): qty 1

## 2022-04-09 MED ORDER — LORAZEPAM 0.5 MG PO TABS
0.5000 mg | ORAL_TABLET | Freq: Two times a day (BID) | ORAL | Status: DC | PRN
  Administered 2022-04-23: 0.5 mg via ORAL
  Administered 2022-04-25: 1 mg via ORAL
  Filled 2022-04-09: qty 1
  Filled 2022-04-09 (×2): qty 2

## 2022-04-09 MED ORDER — ENSURE ENLIVE PO LIQD
237.0000 mL | Freq: Three times a day (TID) | ORAL | 12 refills | Status: DC
Start: 2022-04-09 — End: 2022-04-28

## 2022-04-09 MED ORDER — BISOPROLOL FUMARATE 5 MG PO TABS
10.0000 mg | ORAL_TABLET | Freq: Every day | ORAL | Status: DC
  Administered 2022-04-09 – 2022-04-27 (×19): 10 mg via ORAL
  Filled 2022-04-09 (×19): qty 2

## 2022-04-09 MED ORDER — TRAZODONE HCL 50 MG PO TABS
50.0000 mg | ORAL_TABLET | Freq: Every evening | ORAL | Status: DC | PRN
  Administered 2022-04-18 – 2022-04-20 (×2): 50 mg via ORAL
  Filled 2022-04-09 (×4): qty 1

## 2022-04-09 MED ORDER — IBUPROFEN 400 MG PO TABS: 400.0000 mg | ORAL_TABLET | Freq: Three times a day (TID) | ORAL | 0 refills | Status: AC | PRN

## 2022-04-09 MED ORDER — ONDANSETRON HCL 4 MG/2ML IJ SOLN: 4.0000 mg | Freq: Four times a day (QID) | INTRAMUSCULAR | Status: DC | PRN

## 2022-04-09 MED ORDER — IBUPROFEN 400 MG PO TABS: 400.0000 mg | ORAL_TABLET | Freq: Three times a day (TID) | ORAL | Status: DC | PRN

## 2022-04-09 MED ORDER — VANCOMYCIN HCL 1500 MG/300ML IV SOLN
1500.0000 mg | Freq: Once | INTRAVENOUS | Status: DC
  Filled 2022-04-09: qty 300

## 2022-04-09 MED ORDER — MONTELUKAST SODIUM 10 MG PO TABS
10.0000 mg | ORAL_TABLET | Freq: Every day | ORAL | Status: DC
  Administered 2022-04-09 – 2022-04-27 (×19): 10 mg via ORAL
  Filled 2022-04-09 (×19): qty 1

## 2022-04-09 MED ORDER — SODIUM CHLORIDE 0.9 % IV SOLN: 8.0000 mg | Freq: Four times a day (QID) | INTRAVENOUS | Status: DC | PRN

## 2022-04-09 MED ORDER — CALCIUM POLYCARBOPHIL 625 MG PO TABS
625.0000 mg | ORAL_TABLET | Freq: Two times a day (BID) | ORAL | Status: DC
  Administered 2022-04-09 – 2022-04-19 (×18): 625 mg via ORAL
  Filled 2022-04-09 (×20): qty 1

## 2022-04-09 MED ORDER — CALCIUM POLYCARBOPHIL 625 MG PO TABS: 625.0000 mg | ORAL_TABLET | Freq: Two times a day (BID) | ORAL | Status: DC

## 2022-04-09 MED ORDER — DIPHENHYDRAMINE HCL 25 MG PO CAPS
25.0000 mg | ORAL_CAPSULE | Freq: Four times a day (QID) | ORAL | 0 refills | Status: DC | PRN
Start: 2022-04-09 — End: 2022-04-28

## 2022-04-09 MED ORDER — LIP MEDEX EX OINT
1.0000 "application " | TOPICAL_OINTMENT | Freq: Two times a day (BID) | CUTANEOUS | Status: DC
  Administered 2022-04-18 – 2022-04-28 (×5): 1 via TOPICAL
  Filled 2022-04-09: qty 7

## 2022-04-09 MED ORDER — FAMOTIDINE 20 MG PO TABS
20.0000 mg | ORAL_TABLET | Freq: Two times a day (BID) | ORAL | Status: DC
  Administered 2022-04-09 – 2022-04-28 (×37): 20 mg via ORAL
  Filled 2022-04-09 (×37): qty 1

## 2022-04-09 MED ORDER — ENOXAPARIN SODIUM 40 MG/0.4ML IJ SOSY: 40.0000 mg | PREFILLED_SYRINGE | INTRAMUSCULAR | Status: DC

## 2022-04-09 MED ORDER — METHOCARBAMOL 1000 MG PO TABS: 1000.0000 mg | ORAL_TABLET | Freq: Three times a day (TID) | ORAL | Status: DC | PRN

## 2022-04-09 MED ORDER — DIPHENHYDRAMINE HCL 25 MG PO CAPS
25.0000 mg | ORAL_CAPSULE | Freq: Four times a day (QID) | ORAL | Status: DC | PRN
Start: 2022-04-09 — End: 2022-04-09

## 2022-04-09 MED ORDER — SODIUM CHLORIDE 0.9% FLUSH: 10.0000 mL | INTRAVENOUS | Status: DC | PRN

## 2022-04-09 MED ORDER — MICAFUNGIN SODIUM 100 MG IV SOLR
INTRAVENOUS | Status: DC
Start: 2022-04-09 — End: 2022-06-29

## 2022-04-09 MED ORDER — ALBUTEROL SULFATE (2.5 MG/3ML) 0.083% IN NEBU
3.0000 mL | INHALATION_SOLUTION | Freq: Four times a day (QID) | RESPIRATORY_TRACT | Status: DC | PRN
  Administered 2022-04-11 – 2022-04-19 (×16): 3 mL via RESPIRATORY_TRACT
  Filled 2022-04-09 (×19): qty 3

## 2022-04-09 MED ORDER — ADULT MULTIVITAMIN W/MINERALS CH
1.0000 | ORAL_TABLET | Freq: Every day | ORAL | Status: DC
  Administered 2022-04-10 – 2022-04-28 (×18): 1 via ORAL
  Filled 2022-04-09 (×18): qty 1

## 2022-04-09 MED ORDER — PANTOPRAZOLE SODIUM 40 MG PO TBEC
40.0000 mg | DELAYED_RELEASE_TABLET | Freq: Every day | ORAL | Status: DC
  Administered 2022-04-10 – 2022-04-28 (×18): 40 mg via ORAL
  Filled 2022-04-09 (×18): qty 1

## 2022-04-09 MED ORDER — TRAMADOL HCL 50 MG PO TABS: 50.0000 mg | ORAL_TABLET | Freq: Four times a day (QID) | ORAL | Status: DC | PRN

## 2022-04-09 MED ORDER — ALUM & MAG HYDROXIDE-SIMETH 200-200-20 MG/5ML PO SUSP: 30.0000 mL | Freq: Four times a day (QID) | ORAL | Status: DC | PRN

## 2022-04-09 MED ORDER — FERROUS SULFATE 325 (65 FE) MG PO TABS: 325.0000 mg | ORAL_TABLET | Freq: Two times a day (BID) | ORAL | 3 refills | Status: DC

## 2022-04-09 MED ORDER — ONDANSETRON 4 MG PO TBDP: 4.0000 mg | ORAL_TABLET | Freq: Four times a day (QID) | ORAL | 0 refills | Status: DC | PRN

## 2022-04-09 MED ORDER — TRAZODONE HCL 50 MG PO TABS: 50.0000 mg | ORAL_TABLET | Freq: Every evening | ORAL | Status: DC | PRN

## 2022-04-09 MED ORDER — METHOCARBAMOL 500 MG PO TABS
1000.0000 mg | ORAL_TABLET | Freq: Three times a day (TID) | ORAL | Status: DC
  Administered 2022-04-09 – 2022-04-26 (×15): 1000 mg via ORAL
  Filled 2022-04-09 (×43): qty 2

## 2022-04-09 MED ORDER — ASCORBIC ACID 500 MG PO TABS: 500.0000 mg | ORAL_TABLET | Freq: Two times a day (BID) | ORAL | Status: DC

## 2022-04-09 MED ORDER — ONDANSETRON 4 MG PO TBDP: 4.0000 mg | ORAL_TABLET | Freq: Four times a day (QID) | ORAL | Status: DC | PRN

## 2022-04-09 MED ORDER — ACETAMINOPHEN 500 MG PO TABS: 1000.0000 mg | ORAL_TABLET | Freq: Four times a day (QID) | ORAL | 0 refills | Status: AC | PRN

## 2022-04-09 MED ORDER — ADULT MULTIVITAMIN W/MINERALS CH: 1.0000 | ORAL_TABLET | Freq: Every day | ORAL | Status: DC

## 2022-04-09 MED ORDER — ASCORBIC ACID 500 MG PO TABS
500.0000 mg | ORAL_TABLET | Freq: Two times a day (BID) | ORAL | Status: DC
  Administered 2022-04-09 – 2022-04-28 (×37): 500 mg via ORAL
  Filled 2022-04-09 (×38): qty 1

## 2022-04-09 MED ORDER — LOPERAMIDE HCL 2 MG PO CAPS: 2.0000 mg | ORAL_CAPSULE | Freq: Three times a day (TID) | ORAL | 0 refills | Status: AC

## 2022-04-09 MED ORDER — PIPERACILLIN-TAZOBACTAM 3.375 G IVPB: 3.3750 g | Freq: Three times a day (TID) | INTRAVENOUS | Status: DC

## 2022-04-09 NOTE — Progress Notes (Signed)
Id brief note  Patient transferred to inpatient rehab at [facility]   A/p Intraabdominal abscesses with involvement of left thigh space, complication after rectal hemorroid dearterialization  S/p exlap with divertign colostomy 5/6  2 remaining draines in place RLQ   Repeat ct 5/22 with defined left pelvis wall 5cm fluid collection otherwise improved/stable other fluid collection  Fluconazole allergy    -continue piptazo -continue micafungin -please refer to 5/25 id note for detail -ID inpatient service is not actively following patient -- please reengage in 2-3 weeks for reassessment if he continues to be inpatient rehab -if he discharges earlier please also call ID to setup OPAT and outpatient ID follow up -discussed with primary team

## 2022-04-09 NOTE — Progress Notes (Signed)
Pharmacy Antibiotic Note  Tyler [name] is a 71 y.o. male admitted on 04/09/2022 .    Pharmacy received call about patient with new fever and worsening respiratory status this evening. WBC normal at 4.3. Patient is being seen by ID and is currently receiving micafungin and zosyn.     Plan: Restart vancomycin '1500mg'$  x1 then 1g q 12 hours Consider trough at steady state Continue current dosing of micafungin and zosyn  Height: '5\' 9"'$  (175.3 cm) Weight: 75.1 kg (165 lb 9.1 oz) IBW/kg (Calculated) : 70.7  Temp (24hrs), Avg:99 F (37.2 C), Min:98 F (36.7 C), Max:100.2 F (37.9 C)  Recent Labs  Lab 04/04/22 0502 04/04/22 2333 04/05/22 0432 04/07/22 0503 04/09/22 0243 04/09/22 1545 04/09/22 1925  WBC 6.7  --  7.2 8.4 5.3 4.3  --   CREATININE 0.93 0.91  --   --   --   --  1.05     Estimated Creatinine Clearance: 66.4 mL/min (by C-G formula based on SCr of 1.05 mg/dL).    Allergies  Allergen Reactions   Fluconazole Hives, Rash and Other (See Comments)    Shingles activated    Griseofulvin Anaphylaxis, Swelling, Rash and Other (See Comments)    Throat swelling     Penicillins Rash and Other (See Comments)    ?fever, tolerates Cefepime TOLERATING ZOSYN [id number]   Sulfa Antibiotics Other (See Comments)    Joints ache, swell and caused inflammation    Oxycodone Nausea Only and Other (See Comments)    Nauseous to the point of almost vomiting   Mometasone Furo-Formoterol Fum Other (See Comments)    Lack of therapeutic effect    Retapamulin Rash   Erin Hearing PharmD., BCPS Clinical Pharmacist 04/09/2022 10:10 PM

## 2022-04-09 NOTE — Discharge Summary (Signed)
Physician Discharge Summary  Patient ID: Tyler [name] MRN: [id number] DOB/AGE: December 18, 1950 71 y.o.  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  He was noted to have necrosis of his anal canal with rectal perforation.  He was placed on IV antibiotics and made n.p.o.  PICC line was placed and he was started on TPN.  He was taken to the OR where his infection and abscess were drained 03/15/2022.  He continued to decline and CT was ordered.  This showed significant retroperitoneal infection.  Therefore we decided to proceed to the OR for diverting ostomy on Mar 20, 2022.  Patient was noted to have extensive retroperitoneal and preperitoneal infection.  Drains were placed in the pelvis was washed out.  Patient subsequently then then began to improve, but he continued to have high-grade fevers.  Cultures grew out E. coli and Enterococcus bacillus.  CT scan on 5/14 showed a left retroperitoneal fluid collection and a left thigh fluid collection.  Patient continued to have fevers and ID was consulted.  They recommended imaging of the left thigh and eventually IR aspiration was performed on May 16.  This grew a  small amount of Candida.  They also recommended switching to Zosyn despite his penicillin allergy and high fevers when we tried it before.  Patient did have some fevers on the first day of starting Zosyn but these subsided afterwards.  He was placed on micafungin for the Candida due to his allergy to Diflucan.  His PICC line and TPN were removed due to the fact that he was tolerating a diet and we were concerned these may be the source of his fevers.  His infectious issues slowed and stopped after doing these things.  He continues to have drainage through his JP drains in the pelvis and in the preperitoneal space.  The JP drain in his left retroperitoneum was accidentally removed by the patient on 5/23. A follow-up CT scan was performed on 5/22.  This showed that the retroperitoneal fluid collection was improving.  There was a new left sided small pelvic collection in the left thigh fluid collection was stable.  Given that his white count was normal and he was improving clinically, it was decided to continue on the current IV antibiotic course.  He began physical therapy and during this time we were working on his enteral nutrition.  By 04/09/2022, the patient was felt to be in stable condition from an infection standpoint.  He was tolerating a diet and improving daily.  It was recommended to continue rehab and an inpatient setting.  This was arranged and the patient was transferred.  Consults: pulmonary/intensive care and ID  Significant Diagnostic Studies: labs: cbc, chemistries, microbiology: blood culture: negative and  wound culture: positive, and radiology: CXR: atelectasis bilaterally and pleural effusion: bilaterally, MRI: Left left fluid collections, CT scan: See dictated report above, and Ultrasound: Left thigh fluid collection  Treatments: IV hydration, antibiotics: Zosyn and Micafungin, analgesia: Dilaudid and Tramadol, TPN, therapies: PT and OT, procedures: PICC line and IR aspiriation, and  surgery: I&D gluteal abscess, diverting ostomy, pelvic washout and drain placement.    Discharge Exam: Blood pressure 129/80, pulse 88, temperature 98 F (36.7 C), resp. rate 18, height '5\' 9"'$  (1.753 m), weight 74.7 kg, SpO2 97 %. General appearance: alert, cooperative, and no distress Resp: clear to auscultation bilaterally Cardio: regular rate and rhythm GI: normal findings: soft, non-tender and ostomy in place and functioning  Disposition: Discharge disposition: 02-Transferred to [facility]     Transferred to [facility].  Patient will need a follow-up CT scan of the abdomen and pelvis and left thigh June 8th or 9th.  F/u in my office the following week if out of rehab.   Allergies as of 04/09/2022       Reactions   Fluconazole Hives, Rash, Other (See Comments)   Shingles activated   Griseofulvin Anaphylaxis, Swelling, Rash, Other (See Comments)   Throat swelling   Penicillins Rash, Other (See Comments)   ?fever, tolerates Cefepime TOLERATING ZOSYN [id number]   Sulfa Antibiotics Other (See Comments)   Joints ache, swell and caused inflammation   Oxycodone Nausea Only, Other (See Comments)   Nauseous to the point of almost vomiting   Mometasone Furo-formoterol Fum Other (See Comments)   Lack of therapeutic effect   Retapamulin Rash        Medication List     STOP taking these medications    meloxicam 15 MG tablet Commonly known as: MOBIC   oxyCODONE 5 MG immediate release tablet Commonly known as: Oxy IR/ROXICODONE       TAKE these medications    acetaminophen 500 MG tablet Commonly known as: TYLENOL Take 2 tablets (1,000 mg total) by mouth every 6 (six) hours as needed for mild pain.   albuterol 108 (90 Base) MCG/ACT inhaler Commonly known as: VENTOLIN HFA Inhale 2 puffs into the lungs every 6 (six) hours as needed for wheezing or shortness of breath.   amLODipine 5 MG tablet Commonly known as: NORVASC TAKE 1 TABLET BY MOUTH DAILY What changed: when  to take this   ascorbic acid 500 MG tablet Commonly known as: VITAMIN C Take 1 tablet (500 mg total) by mouth 2 (two) times daily.   azelastine 0.1 % nasal spray Commonly known as: ASTELIN Place 1 spray into both nostrils 2 (two) times daily as needed for rhinitis. Use in each nostril as directed   bisoprolol 10 MG tablet Commonly known as: ZEBETA Take 10 mg by mouth at bedtime.   diphenhydrAMINE 25 mg capsule Commonly known as: BENADRYL Take 1 capsule (25 mg total) by mouth every 6 (six) hours as needed for itching, allergies or sleep (anxiety).   feeding supplement Liqd Take 237 mLs by mouth 3 (three) times daily between meals.   ferrous sulfate 325 (65 FE) MG tablet Take 1 tablet (325 mg total) by mouth 2 (two) times daily with a meal.   ibuprofen 400 MG tablet Commonly known as: ADVIL Take 1-2 tablets (400-800 mg total) by mouth every 8 (eight) hours as needed for fever, mild pain or cramping.   loperamide 2 MG capsule Commonly known as: IMODIUM Take 1 capsule (2 mg total) by mouth with breakfast, with lunch, and  with evening meal.   Methocarbamol 1000 MG Tabs Take 1,000 mg by mouth every 8 (eight) hours as needed for muscle spasms.   micafungin in sodium chloride 0.9 % 100 mL '100mg'$  IV q24h   montelukast 10 MG tablet Commonly known as: SINGULAIR TAKE 1 TABLET BY MOUTH EVERY NIGHT AT BEDTIME   multivitamin with minerals Tabs tablet Take 1 tablet by mouth daily.   NexIUM 24HR 20 MG capsule Generic drug: esomeprazole Take 20 mg by mouth daily before breakfast.   ondansetron 4 MG disintegrating tablet Commonly known as: ZOFRAN-ODT Take 1 tablet (4 mg total) by mouth every 6 (six) hours as needed for nausea.   piperacillin-tazobactam 3.375 GM/50ML IVPB Commonly known as: ZOSYN Inject 50 mLs (3.375 g total) into the vein every 8 (eight) hours.   polycarbophil 625 MG tablet Commonly known as: FIBERCON Take 1 tablet (625 mg total) by mouth 2 (two) times daily.    sodium chloride flush 0.9 % Soln Commonly known as: NS 10-40 mLs by Intracatheter route every 12 (twelve) hours.   sodium chloride flush 0.9 % Soln Commonly known as: NS 10-40 mLs by Intracatheter route as needed (flush).   tadalafil 10 MG tablet Commonly known as: CIALIS Take 10 mg by mouth daily as needed for erectile dysfunction.   traMADol 50 MG tablet Commonly known as: ULTRAM Take 1-2 tablets (50-100 mg total) by mouth every 6 (six) hours as needed for moderate pain or severe pain.   traZODone 50 MG tablet Commonly known as: DESYREL Take 1 tablet (50 mg total) by mouth at bedtime as needed for sleep.   valACYclovir 500 MG tablet Commonly known as: VALTREX Take 500 mg by mouth in the morning.         Signed: Rosario Adie 8/75/6433, 12:00 PM

## 2022-04-09 NOTE — PMR Pre-admission (Signed)
PMR Admission Coordinator Pre-Admission Assessment   Patient: Tyler [name] is an 71 y.o., male MRN: [id number] DOB: 08/09/1951 Height: 5' 9" (175.3 cm) Weight: 74.7 kg   Insurance Information HMO:     PPO: yes     PCP:      IPA:      80/20:      OTHER:  PRIMARY: [employer]      Policy#: [id number]      Subscriber: pt CM Name: faxed approval      Phone#:      Fax#: [phone] Pre-Cert#: [id number] auth for CIR via faxed approval with updates due to fax listed above on 6/5      Employer:  Benefits:  Phone #: [phone]     Name:  Eff. Date: 11/15/21     Deduct: $1250 (met)      Out of Pocket Max: $4890 (met $4318.11)      Life Max: n/a CIR: 80%      SNF: 80% Outpatient:      Co-Pay: $10-$52 depending on provider Home Health: 80%      Co-Ins: 20% DME: 80%     Co-Ins: 20% Providers: preferred network SECONDARY: Medicare A only      Policy#: [id number]     Phone#:    Financial Counselor:       Phone#:    The "Data Collection Information Summary" for patients in Inpatient Rehabilitation Facilities with attached "Privacy Act Statement-Health Care Records" was provided and verbally reviewed with: N/A   Emergency Contact Information Contact Information       Name Relation Home Work Mobile    Defabo,Andrew Other [phone]   [phone]           Current Medical History  Patient Admitting Diagnosis: debility    History of Present Illness: Tyler [name] is a 71-year-old right-handed male with history of anxiety, Thrombocytopeni, hypertension, quit smoking 33 years ago.  Patient with recent transanal hemorrhoidal dearterialization 4/21 outpatient procedure per Dr. Alicia Thomas.  Postoperative course complicated by increasing abdominal pain, nausea, and fever that persisted x2 days.  He presented to the [facility] emergency department 03/09/2022.  Chemistry showed sodium 132 potassium 3.4 BUN 27 creatinine 1.27, his white count was upper end of normal 10.1.  CT  scan of the abdomen pelvis performed showing thickening of the rectal wall.  There was also gas and fluid within the rectal wall and in the presacral space concerning for possible rectal perforation.  He was made n.p.o. and maintained on TPN for nutritional support with broad-spectrum antibiotics.  His white count increased to 20,000.  He underwent anal exam under anesthesia with debridement, incision and drainage of anal canal, and proctoscopy 03/15/2022.  Hospital course complicated by persistent fevers.  He was transferred to progressive care and underwent diagnostic laparoscopy, abdominal washout and drain placement with creation of diverting colostomy 03/20/2022.  Follow-up CT abdomen pelvis 03/26/2022 showed extensive edema/inflammation and soft tissue gas in the pelvic floor and presacral space as well as a 5.3 x 3.5 x 7.2 cm collection of gas and fluid in the left upper quadrant anterior to the kidney and medial to the spleen.  Infectious disease, Dr. Wallace, consulted in regards to suspect intra-abdominal infection and currently maintained on intravenous Zosyn for coverage as well as micafungin x6 weeks from 5/16 with repeat CT abdomen/pelvis and left thigh recommended in 1 to 2 weeks.  Latest CT abdomen pelvis 04/05/2022 showed improvement in small bilateral pleural effusions and   bibasilar atelectasis as well as improvement in body wall edema.  Small air-fluid collection anterior to the left kidney with possibly developing small fluid collection in the left pelvic sidewall 5.2 x 2.2 cm.  No new fluid collections adjacent to the drains.  He was cleared to begin Lovenox for DVT prophylaxis.  Left lower quadrant JP drain inadvertently came out around 6 AM 04/06/2022 with fall per surgery advise just continue to monitor with dry dressing applied.  Hospital course anemia 8.0.  His white blood cell count has improved to 5300.  Bouts of urinary retention he did require a Foley tube planning voiding trial.  Diet has  been advanced to regular and TPN discontinued.  Therapy evaluations completed due to patient's decreased functional mobility was admitted for a comprehensive rehab program.   Patient's medical record from [facility] has been reviewed by the rehabilitation admission coordinator and physician.   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), bilateral     Chronic pruritic rash in adult 2004    hx shingles  w/ residual recurrent rash, take valtrex daily   ED (erectile dysfunction)     GERD (gastroesophageal reflux disease)     H/O pityriasis rosea     History of basal cell carcinoma (BCC) excision      2022 left calf s/p mohs   History of pertussis 2018   History of SCC (squamous cell carcinoma) of skin      removed from head   History of thrombocytopenia 12/03/2010   Hx of adenomatous polyp of colon 04/07/2017   Hypertension     Mild asthma      followed by pcp   OA (osteoarthritis)      knees, fingers   Prolapsed internal hemorrhoids, grade 3      hx  external hemorroid banding   Wears glasses        Has the patient had major surgery during 100 days prior to admission? Yes   Family History   family history includes AAA (abdominal aortic aneurysm) in his mother; Glaucoma in his sister; Stomach cancer in his paternal grandfather.   Current Medications   Current Facility-Administered Medications:    (feeding supplement) PROSource Plus liquid 30 mL, 30 mL, Oral, TID PC & HS, Thomas, Alicia, MD, 30 mL at 04/09/22 0831   0.9 %  sodium chloride infusion, , Intravenous, PRN, Thomas, Alicia, MD, Last Rate: 10 mL/hr at 04/08/22 1646, New Bag at 04/08/22 1646   acetaminophen (TYLENOL) tablet 1,000 mg, 1,000 mg, Oral, Q6H, Gross, Steven, MD, 1,000 mg at 04/09/22 0627   albuterol (PROVENTIL) (2.5 MG/3ML) 0.083% nebulizer solution 3 mL, 3 mL, Nebulization, Q6H  PRN, Thomas, Alicia, MD, 3 mL at 03/21/22 0548   alum & mag hydroxide-simeth (MAALOX/MYLANTA) 200-200-20 MG/5ML suspension 30 mL, 30 mL, Oral, Q6H PRN, Gross, Steven, MD   ascorbic acid (VITAMIN C) tablet 500 mg, 500 mg, Oral, BID, Gross, Steven, MD, 500 mg at 04/08/22 2131   azelastine (ASTELIN) 0.1 % nasal spray 1 spray, 1 spray, Each Nare, BID PRN, Thomas, Alicia, MD, 1 spray at 03/31/22 1648   bisoprolol (ZEBETA) tablet 10 mg, 10 mg, Oral, QHS, Thomas, Alicia, MD, 10 mg at 04/08/22 2131   Chlorhexidine Gluconate Cloth 2 % PADS 6 each, 6 each, Topical, Q0600, Thomas, Alicia, MD, 6 each at 04/08/22 2100     diphenhydrAMINE (BENADRYL) injection 12.5-25 mg, 12.5-25 mg, Intravenous, Q6H PRN, Thomas, Alicia, MD, 25 mg at 04/01/22 2345   enoxaparin (LOVENOX) injection 40 mg, 40 mg, Subcutaneous, Q24H, Gross, Steven, MD, 40 mg at 04/08/22 1032   famotidine (PEPCID) tablet 20 mg, 20 mg, Oral, BID, Thomas, Alicia, MD, 20 mg at 04/08/22 2132   feeding supplement (ENSURE ENLIVE / ENSURE PLUS) liquid 237 mL, 237 mL, Oral, TID BM, Thomas, Alicia, MD, 237 mL at 04/08/22 2019   ferrous sulfate tablet 325 mg, 325 mg, Oral, BID WC, Gross, Steven, MD, 325 mg at 04/09/22 0830   HYDROmorphone (DILAUDID) injection 0.5-1 mg, 0.5-1 mg, Intravenous, Q6H PRN, Thomas, Alicia, MD   ibuprofen (ADVIL) tablet 400-800 mg, 400-800 mg, Oral, Q8H PRN, Gross, Steven, MD, 800 mg at 04/05/22 0807   lip balm (CARMEX) ointment 1 application., 1 application., Topical, BID, Thomas, Alicia, MD, 1 application. at 04/08/22 2132   loperamide (IMODIUM) capsule 2 mg, 2 mg, Oral, TID with meals, Thomas, Alicia, MD, 2 mg at 04/09/22 0830   LORazepam (ATIVAN) tablet 0.5-1 mg, 0.5-1 mg, Oral, Q12H PRN, Gross, Steven, MD   magic mouthwash, 15 mL, Oral, QID PRN, Thomas, Alicia, MD   MEDLINE mouth rinse, 15 mL, Mouth Rinse, q12n4p, Thomas, Alicia, MD, 15 mL at 04/07/22 1722   methocarbamol (ROBAXIN) tablet 1,000 mg, 1,000 mg, Oral, TID, Thomas,  Alicia, MD, 1,000 mg at 04/08/22 2131   micafungin (MYCAMINE) 100 mg in sodium chloride 0.9 % 100 mL IVPB, 100 mg, Intravenous, Q24H, Wallace, Andrew N, DO, Last Rate: 105 mL/hr at 04/08/22 1336, 100 mg at 04/08/22 1336   multivitamin with minerals tablet 1 tablet, 1 tablet, Oral, Daily, Thomas, Alicia, MD, 1 tablet at 04/08/22 1030   ondansetron (ZOFRAN) injection 4 mg, 4 mg, Intravenous, Q6H PRN, 4 mg at 03/24/22 1925 **OR** ondansetron (ZOFRAN) 8 mg in sodium chloride 0.9 % 50 mL IVPB, 8 mg, Intravenous, Q6H PRN, Thomas, Alicia, MD   ondansetron (ZOFRAN-ODT) disintegrating tablet 4 mg, 4 mg, Oral, Q6H PRN **OR** [DISCONTINUED] ondansetron (ZOFRAN) injection 4 mg, 4 mg, Intravenous, Q6H PRN, Gerkin, Todd, MD   pantoprazole (PROTONIX) EC tablet 40 mg, 40 mg, Oral, Daily, Thomas, Alicia, MD, 40 mg at 04/08/22 1030   phenol (CHLORASEPTIC) mouth spray 2 spray, 2 spray, Mouth/Throat, PRN, Gross, Steven, MD   piperacillin-tazobactam (ZOSYN) IVPB 3.375 g, 3.375 g, Intravenous, Q8H, Thomas, Alicia, MD, Last Rate: 12.5 mL/hr at 04/09/22 0833, 3.375 g at 04/09/22 0833   polycarbophil (FIBERCON) tablet 625 mg, 625 mg, Oral, BID, Gross, Steven, MD, 625 mg at 04/08/22 1035   prochlorperazine (COMPAZINE) injection 5-10 mg, 5-10 mg, Intravenous, Q4H PRN, Gross, Steven, MD   simethicone (MYLICON) 40 MG/0.6ML suspension 80 mg, 80 mg, Oral, QID PRN, Thomas, Alicia, MD, 80 mg at 03/24/22 2209   sodium chloride flush (NS) 0.9 % injection 10-40 mL, 10-40 mL, Intracatheter, Q12H, Thomas, Alicia, MD, 10 mL at 04/08/22 2133   sodium chloride flush (NS) 0.9 % injection 10-40 mL, 10-40 mL, Intracatheter, PRN, Thomas, Alicia, MD, 10 mL at 03/25/22 2138   sodium chloride flush (NS) 0.9 % injection 10-40 mL, 10-40 mL, Intracatheter, Q12H, Thomas, Alicia, MD, 10 mL at 04/08/22 2133   sodium chloride flush (NS) 0.9 % injection 10-40 mL, 10-40 mL, Intracatheter, PRN, Thomas, Alicia, MD   traMADol (ULTRAM) tablet 50-100 mg, 50-100  mg, Oral, Q6H PRN, Gross, Steven, MD, 50 mg at 04/07/22 2127   traZODone (DESYREL) tablet 50 mg, 50 mg, Oral, QHS PRN, Thomas,   Alicia, MD, 50 mg at 04/07/22 2127   Patients Current Diet:  Diet Order                  Diet regular Room service appropriate? Yes with Assist; Fluid consistency: Thin  Diet effective now                         Precautions / Restrictions Precautions Precautions: Fall Precaution Comments: drains, colostomy, rectal drainage Restrictions Weight Bearing Restrictions: No    Has the patient had 2 or more falls or a fall with injury in the past year? 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 Independent   Indoor Mobility: Did the patient need assistance with walking from room to room (with or without device)? Independent   Stairs: Did the patient need assistance with internal or external stairs (with or without device)? Independent   Functional Cognition: Did the patient need help planning regular tasks such as shopping or remembering to take medications? Independent   Patient Information Are you of Hispanic, Latino/a,or Spanish origin?: A. No, not of Hispanic, Latino/a, or Spanish origin What is your race?: A. White Do you need or want an interpreter to communicate with a doctor or health care staff?: 0. No   Patient's Response To:  Health Literacy and Transportation Is the patient able to respond to health literacy and transportation needs?: Yes Health Literacy - How often do you need to have someone help you when you read instructions, pamphlets, or other written material from your doctor or pharmacy?: Never In the past 12 months, has lack of transportation kept you from medical appointments or from getting medications?: No In the past 12 months, has lack of transportation  kept you from meetings, work, or from getting things needed for daily living?: No   Home Assistive Devices / Equipment Home Assistive Devices/Equipment: Eyeglasses Home Equipment: None   Prior Device Use: Indicate devices/aids used by the patient prior to current illness, exacerbation or injury? None of the above   Current Functional Level Cognition   Overall Cognitive Status: Within Functional Limits for tasks assessed Orientation Level: Oriented X4 General Comments: Very pleasant and motivated    Extremity Assessment (includes Sensation/Coordination)   Upper Extremity Assessment: Overall WFL for tasks assessed RUE Deficits / Details: WFL ROM, 5/5 strength RUE Sensation: WNL RUE Coordination: WNL LUE Deficits / Details: WFL ROM, 5/5 strength LUE Sensation: WNL LUE Coordination: WNL  Lower Extremity Assessment: Defer to PT evaluation RLE Deficits / Details: grossly WFL for ROM, 3/5 for MMT with hip flex/ext, 4/5 dorsiflexion, 3/5 plantarflexion RLE Sensation: WNL RLE Coordination: decreased gross motor (secondary to weakness) LLE Deficits / Details: grossly WFL for ROM, 3/5 for MMT with hip flex/ext, 4/5 dorsiflexion, 3/5 plantarflexion LLE Sensation: WNL LLE Coordination: decreased gross motor (secondary to weakness)     ADLs   Overall ADL's : Needs assistance/impaired Eating/Feeding: Set up, Sitting Grooming: Standing, Oral care, Set up, Min guard Grooming Details (indicate cue type and reason): Pt stood at sink with Min gaurd assist for steadying and full setup for grooming tasks as pt required unilateral UE support on RW to fee safe. Chair placed behind pt in case of dizziness and pt did require a sitting rest directly after toothbrushing.  After pt took ~4 steps from EOB-->sink   with RW and Min As, stood for 1 grooming task and lowered to chair, pt stated a RPE score of 8/10, suggesting near maximum effort. Upper Body Bathing: Sitting, Minimal assistance Upper Body Bathing  Details (indicate cue type and reason): sitting EOB Lower Body Bathing: Moderate assistance, Sit to/from stand, Cueing for compensatory techniques Lower Body Bathing Details (indicate cue type and reason): Pt unable to sit in figure 4 position or lean foward at this time.  Stretching to hip joint in figure 4 completed in prep for LE dressing. Pt may benefit from adaptive eqiupment. Upper Body Dressing : Minimal assistance, Sitting Lower Body Dressing: Maximal assistance, Sit to/from stand Lower Body Dressing Details (indicate cue type and reason): min assist for sit to stand from elevated bed. Total assist for shoes and socks at this time due to fatigue and inablility to access feet due to pain. Toilet Transfer: Minimal assistance, Ambulation Toilet Transfer Details (indicate cue type and reason): Pt stood from EOB x 1 to RW and stood from recliner x 2 to RW all with Min As to power up. Pt ambulated ~4' with RW to sink with Min As. Pt performed stand pivot from recliner back to EOB with Min As and RW. Toileting- Clothing Manipulation and Hygiene: Total assistance Functional mobility during ADLs: Minimal assistance, Rolling walker (2 wheels) General ADL Comments: Pt overall doing UE adls with min assist to set up and LE adls with mod to max assist. Pt very dizzy when standing today and was orthostatic.  See assessment section for BPs.     Mobility   Overal bed mobility: Needs Assistance Bed Mobility: Sit to Supine Rolling: Supervision Sidelying to sit: Min guard Supine to sit: Min assist, HOB elevated Sit to supine: Min guard General bed mobility comments: Min guard to assist with trunk position in bed, but pt able to return to supine, controlling trunk and LEs with supervision.     Transfers   Overall transfer level: Needs assistance Equipment used: Rolling walker (2 wheels) Transfers: Sit to/from Stand Sit to Stand: Min guard Bed to/from chair/wheelchair/BSC transfer type:: Stand  pivot Stand pivot transfers: Min assist, From elevated surface Step pivot transfers: Min assist General transfer comment: min guard to power to stand, VC for hand placement, denies dizziness or lightheadedness, BUE assisting to power up     Ambulation / Gait / Stairs / Wheelchair Mobility   Ambulation/Gait Ambulation/Gait assistance: Min assist Gait Distance (Feet): 5 Feet (x4) Assistive device: Rolling walker (2 wheels) Gait Pattern/deviations: Step-to pattern, Trunk flexed, Decreased stride length General Gait Details: step-to pattern with flexed trunk, slow steps and slightly shaky, denies dizziness or lightheadedness, fatigues with ambulation and limited by wound draining Gait velocity: decreased     Posture / Balance Dynamic Sitting Balance Sitting balance - Comments: fatigues easily Balance Overall balance assessment: Needs assistance Sitting-balance support: No upper extremity supported Sitting balance-Leahy Scale: Fair Sitting balance - Comments: fatigues easily Postural control: Posterior lean Standing balance support: During functional activity, Reliant on assistive device for balance, Single extremity supported Standing balance-Leahy Scale: Poor Standing balance comment: reliant on walker     Special needs/care consideration Continuous Drip IV  IV mycamine and IV zosyn, Skin new colostomy, 2x abdominal drains, 1x perianal drain, and Special service needs WON consult    Previous Home Environment (from acute therapy documentation) Living Arrangements: Spouse/significant other Available Help at Discharge: Family Type of Home: House Home Access: Stairs to enter Entrance Stairs-Number of Steps: 3 Home Care Services: No     Discharge Living Setting Plans for Discharge Living Setting: Patient's home, Lives with (comment) (partner, Andrew) Type of Home at Discharge: House Discharge Home Layout: One level Discharge Home Access: Stairs to enter Entrance Stairs-Number of Steps:  3 Discharge Bathroom Shower/Tub: Walk-in shower Discharge Bathroom Toilet: Standard Discharge Bathroom Accessibility: Yes How Accessible: Accessible via walker Does the patient have any problems obtaining your medications?: No   Social/Family/Support Systems Patient Roles: Partner Anticipated Caregiver: Andrew Defabo Anticipated Caregiver's Contact Information: [phone] Caregiver Availability: 24/7 Discharge Plan Discussed with Primary Caregiver: Yes Is Caregiver In Agreement with Plan?: Yes Does Caregiver/Family have Issues with Lodging/Transportation while Pt is in Rehab?: No   Goals Patient/Family Goal for Rehab: PT/OT supervision to mod I, SLP n/a Expected length of stay: 14-16 days Additional Information: pt with 3 drains, IV abx and IV antifungal, new ostomy Pt/Family Agrees to Admission and willing to participate: Yes Program Orientation Provided & Reviewed with Pt/Caregiver Including Roles  & Responsibilities: Yes Additional Information Needs: wound ostomy nurse consult  Barriers to Discharge: Medical stability, IV antibiotics, Insurance for SNF coverage   Decrease burden of Care through IP rehab admission: n/a   Possible need for SNF placement upon discharge: No   Patient Condition: I have reviewed medical records from [facility], spoken with CSW, and patient. I met with patient at the bedside and discussed via phone for inpatient rehabilitation assessment.  Patient will benefit from ongoing PT and OT, can actively participate in 3 hours of therapy a day 5 days of the week, and can make measurable gains during the admission.  Patient will also benefit from the coordinated team approach during an Inpatient Acute Rehabilitation admission.  The patient will receive intensive therapy as well as Rehabilitation physician, nursing, social worker, and care management interventions.  Due to bowel management, safety, skin/wound care, disease management, medication administration,  pain management, and patient education the patient requires 24 hour a day rehabilitation nursing.  The patient is currently min assist with mobility and mod/max with basic ADLs.  Discharge setting and therapy post discharge at home with home health is anticipated.  Patient has agreed to participate in the Acute Inpatient Rehabilitation Program and will admit today.   Preadmission Screen Completed By:  Caitlin E Warren, PT, DPT 04/09/2022 10:00 AM ______________________________________________________________________   Discussed status with Dr. Zykee Avakian on 04/09/22  at 10:18 AM  and received approval for admission today.   Admission Coordinator:  Caitlin E Warren, PT, DPT time 10:18 AM /Date 04/09/22     Assessment/Plan: Diagnosis: Debility Does the need for close, 24 hr/day Medical supervision in concert with the patient's rehab needs make it unreasonable for this patient to be served in a less intensive setting? 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 Yes Does the patient require coordinated care of a physician, rehab nurse, PT, OT to address physical and functional deficits in the context of the above medical diagnosis(es)? Yes Addressing deficits in the following areas: balance, endurance, locomotion, strength, transferring, bowel/bladder control, bathing, dressing, feeding, grooming, toileting, and psychosocial support Can the patient actively participate in an intensive therapy program of at least 3 hrs of therapy 5 days a week? Yes The potential for patient to make measurable gains while on inpatient rehab is excellent Anticipated functional outcomes upon

## 2022-04-09 NOTE — Progress Notes (Addendum)
Received call from bedside nurse that patient was febrile to 101.6, tachycardic (HR 115) with acute on-site of cough.  Patient is on room air with SpO2 at 97 %.  Last COVID test 02/17/22 and last CT abdomen on 04/05/22.  Patient is also subjectively reporting wheezing and shortness of breath.  OR cultures from abdominal abscess (03/20/22) shows presence of E. Coli, E. Faecalis, and anaerobes.  Abscess on left thigh positive (03/30/22) for candida albicans.  Last CT abdomen on 04/05/22 showed possible fluid accumulation.  Pt inadvertently had one JP drain come out on the morning of 04/06/22. Consulted with Dr. Dagoberto Ligas, MD and Dietrich Pates [place] (on-call ID) and Georga Bora, Pharm D concerning adding on any additional antibiotic coverage. Per pharmacy, current regimen would cover the OR cultures species present.  Per ID recommendations, continue course of IV Zosyn and micafungin.  Initially a vancomycin consult was placed, but later discontinued following provided recommendations.  ID will follow with patient this weekend.  Dr. Camie Patience also recommended blood cultures and  suggested that a repeat CT Abdomen might be indicated.  Orders placed for CXR, Respiratory panel, COVID, and am labs (CBC/BMP).

## 2022-04-09 NOTE — Discharge Instructions (Addendum)
Inpatient Rehab Discharge Instructions  Tyler [name] Discharge date and time: No discharge date for patient encounter.   Activities/Precautions/ Functional Status: Activity: activity as tolerated Diet: regular diet Wound Care: Routine skin checks Functional status:  ___ No restrictions     ___ Walk up steps independently ___ 24/7 supervision/assistance   ___ Walk up steps with assistance ___ Intermittent supervision/assistance  ___ Bathe/dress independently ___ Walk with walker     _x__ Bathe/dress with assistance ___ Walk Independently    ___ Shower independently ___ Walk with assistance    ___ Shower with assistance ___ No alcohol     ___ Return to work/school ________  Special Instructions: No driving smoking or alcohol  Routine ostomy care  Wound care to left lower buttock full-thickness wound.  Cleanse with soap and water, rinse and pat dry.  Cover with size appropriate piece of silver Hydrofiber (aqua seal) top of single folded gauze 4 x 4.  Cover/secure with silicone foam dressing.  Change daily.  Encouraged side-lying positioning while in bed, minimize time in the supine position.  Follow-up with interventional radiology for repeat abdomen/pelvis CT and left thigh CT prior to 6/25  Continue Primaxin 500 mg every 6 hours and micafungin 100 mg every 24 hours through 05/09/2022 per infectious disease    COMMUNITY REFERRALS UPON DISCHARGE:    Home Health:   PT  & RN                  Agency:  [facility] Equipment/Items Ordered: Vassie Moselle                                                 Agency/Supplier:[facility]  [phone]    My questions have been answered and I understand these instructions. I will adhere to these goals and the provided educational materials after my discharge from the hospital.  Patient/Caregiver Signature _______________________________ Date __________  Clinician Signature  _______________________________________ Date __________  Please bring this form and your medication list with you to all your follow-up doctor's appointments.

## 2022-04-09 NOTE — Progress Notes (Signed)
Inpatient Rehab Admissions Coordinator:   Dr. Marcello Moores in agreement for transfer to CIR today.  I will let pt/family and TOC team know.  I will arrange transport with [facility] as soon as I have a room number and let RN know estimated pick up time.   Shann Medal, PT, DPT Admissions Coordinator [phone] 04/09/22  10:19 AM

## 2022-04-09 NOTE — Progress Notes (Signed)
Inpatient Rehabilitation Admission Medication Review by a Pharmacist  A complete drug regimen review was completed for this patient to identify any potential clinically significant medication issues.  High Risk Drug Classes Is patient taking? 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  Opioid Yes Tramadol- acute pain  Antiplatelet No   Hypoglycemics/insulin No   Vasoactive Medication Yes Zebeta- hypertension  Chemotherapy No   Other Yes Protonix, pepcid- GERD Robaxin- muscle spasms Ferrous sulfate- iron deficiency Ativan- anxiety Trazodone- sleep     Type of Medication Issue Identified Description of Issue Recommendation(s)  Drug Interaction(s) (clinically significant)     Duplicate Therapy     Allergy     No Medication Administration End Date     Incorrect Dose     Additional Drug Therapy Needed     Significant med changes from prior encounter (inform family/care partners about these prior to discharge).    Other  PTA meds: Albuterol HFA 2 q6h prn wheezing / SOB Norvasc 5 mg daily Azelastine 0.1% 1 spray each nare bid prn rhinitis Valium 5 mg q8h prn anxiety or muscle spasms Mobic 15 mg qday prn pain Singulair 10 mg bedtime oxyIR 5 mg 1-2 tabs q6h prn severe pain Cialis 10 mg daily prn ED Valtrex 500 mg daily Restart PTA meds when and if necessary during CIR admission or at time of discharge, if warranted    Clinically significant medication issues were identified that warrant physician communication and completion of prescribed/recommended actions by midnight of the next day:  No   Time spent performing this drug regimen review (minutes):  30   Coulson Wehner BS, PharmD, BCPS Clinical Pharmacist 04/09/2022 1:48 PM  Contact: [phone] after 3 PM  "Be curious, not judgmental..." -Jamal Maes

## 2022-04-09 NOTE — Progress Notes (Signed)
PHARMACY CONSULT NOTE FOR:  OUTPATIENT  PARENTERAL ANTIBIOTIC THERAPY (OPAT)  Indication: Intra-abdominal Abscesses Regimen: Zosyn 3.375g EI every 8 hours + Micafungin 100 mg IV every 24 hours End date: 05/11/22  IV antibiotic discharge orders are pended. To discharging provider:  please sign these orders via discharge navigator,  Select New Orders & click on the button choice - Manage This Unsigned Work.     Thank you for allowing pharmacy to be a part of this patient's care.  Alycia Rossetti, PharmD, BCPS Infectious Diseases Clinical Pharmacist 04/09/2022 8:32 AM   **Pharmacist phone directory can now be found on [email or web address] ([email or web address]).  Listed under [place].

## 2022-04-09 NOTE — Consult Note (Addendum)
[place] Nurse ostomy follow up Pt is familiar to [place] team from previous admission at [facility]; they just arrived at [facility]. Consult was performed on 5/26 for pouch change and teaching session, refer to previous progress notes. Instructions have been provided for staff nurses to perform pouch changes PRN if pouch is leaking.  [place] team will perform additional teaching sessions weekly while in rehab. Julien Girt MSN, RN, [place], [place], [place]

## 2022-04-09 NOTE — Progress Notes (Signed)
PMR Admission Coordinator Pre-Admission Assessment   Patient: Tyler [name] is an 71 y.o., male MRN: [id number] DOB: 1950-11-25 Height: 5\' 9"  (175.3 cm) Weight: 74.7 kg   Insurance Information HMO:     PPO: yes     PCP:      IPA:      80/20:      OTHER:  PRIMARY: [employer]      Policy#: [id number]      Subscriber: pt CM Name: faxed approval      Phone#:      Fax#: [phone] Pre-Cert#: [id number] auth for CIR via faxed approval with updates due to fax listed above on 6/5      Employer:  Benefits:  Phone #: [phone]     Name:  Eff. Date: 11/15/21     Deduct: $1250 (met)      Out of Pocket Max: [phone] (met [phone])      Life Max: n/a CIR: 80%      SNF: 80% Outpatient:      Co-Pay: $10-$52 depending on provider Home Health: 80%      Co-Ins: 20% DME: 80%     Co-Ins: 20% Providers: preferred network SECONDARY: Medicare A only      Policy#: [id number]     Phone#:    Financial Counselor:       Phone#:    The Engineer, materials Information Summary" for patients in Inpatient Rehabilitation Facilities with attached "Privacy Act Statement-Health Care Records" was provided and verbally reviewed with: N/A   Emergency Contact Information Contact Information       Name Relation Home Work Kuna Other [phone]   [phone]           Current Medical History  Patient Admitting Diagnosis: debility    History of Present Illness: Tyler [name] is a 71 year old right-handed male with history of anxiety, Thrombocytopeni, hypertension, quit smoking 33 years ago.  Patient with recent transanal hemorrhoidal dearterialization 4/21 outpatient procedure per Dr. Romie Levee.  Postoperative course complicated by increasing abdominal pain, nausea, and fever that persisted x2 days.  He presented to the [facility] emergency department 03/09/2022.  Chemistry showed sodium 132 potassium 3.4 BUN 27 creatinine 1.27, his white count was upper end of normal 10.1.  CT  scan of the abdomen pelvis performed showing thickening of the rectal wall.  There was also gas and fluid within the rectal wall and in the presacral space concerning for possible rectal perforation.  He was made n.p.o. and maintained on TPN for nutritional support with broad-spectrum antibiotics.  His white count increased to 20,000.  He underwent anal exam under anesthesia with debridement, incision and drainage of anal canal, and proctoscopy 03/15/2022.  Hospital course complicated by persistent fevers.  He was transferred to progressive care and underwent diagnostic laparoscopy, abdominal washout and drain placement with creation of diverting colostomy 03/20/2022.  Follow-up CT abdomen pelvis 03/26/2022 showed extensive edema/inflammation and soft tissue gas in the pelvic floor and presacral space as well as a 5.3 x 3.5 x 7.2 cm collection of gas and fluid in the left upper quadrant anterior to the kidney and medial to the spleen.  Infectious disease, Dr. Earlene Plater, consulted in regards to suspect intra-abdominal infection and currently maintained on intravenous Zosyn for coverage as well as micafungin x6 weeks from 5/16 with repeat CT abdomen/pelvis and left thigh recommended in 1 to 2 weeks.  Latest CT abdomen pelvis 04/05/2022 showed improvement in small bilateral pleural effusions and  bibasilar atelectasis as well as improvement in body wall edema.  Small air-fluid collection anterior to the left kidney with possibly developing small fluid collection in the left pelvic sidewall 5.2 x 2.2 cm.  No new fluid collections adjacent to the drains.  He was cleared to begin Lovenox for DVT prophylaxis.  Left lower quadrant JP drain inadvertently came out around 6 AM 04/06/2022 with fall per surgery advise just continue to monitor with dry dressing applied.  Hospital course anemia 8.0.  His white blood cell count has improved to 5300.  Bouts of urinary retention he did require a Foley tube planning voiding trial.  Diet has  been advanced to regular and TPN discontinued.  Therapy evaluations completed due to patient's decreased functional mobility was admitted for a comprehensive rehab program.   Patient's medical record from [facility] has been reviewed by the rehabilitation admission coordinator and physician.   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), bilateral     Chronic pruritic rash in adult 2004    hx shingles  w/ residual recurrent rash, take valtrex daily   ED (erectile dysfunction)     GERD (gastroesophageal reflux disease)     H/O pityriasis rosea     History of basal cell carcinoma (BCC) excision      2022 left calf s/p mohs   History of pertussis 2018   History of SCC (squamous cell carcinoma) of skin      removed from head   History of thrombocytopenia 12/03/2010   Hx of adenomatous polyp of colon 04/07/2017   Hypertension     Mild asthma      followed by pcp   OA (osteoarthritis)      knees, fingers   Prolapsed internal hemorrhoids, grade 3      hx  external hemorroid banding   Wears glasses        Has the patient had major surgery during 100 days prior to admission? Yes   Family History   family history includes AAA (abdominal aortic aneurysm) in his mother; Glaucoma in his sister; Stomach cancer in his paternal grandfather.   Current Medications   Current Facility-Administered Medications:    (feeding supplement) PROSource Plus liquid 30 mL, 30 mL, Oral, TID PC & HS, Romie Levee, MD, 30 mL at 04/09/22 0831   0.9 %  sodium chloride infusion, , Intravenous, PRN, Romie Levee, MD, Last Rate: 10 mL/hr at 04/08/22 1646, New Bag at 04/08/22 1646   acetaminophen (TYLENOL) tablet 1,000 mg, 1,000 mg, Oral, Q6H, Gross, Viviann Spare, MD, 1,000 mg at 04/09/22 0627   albuterol (PROVENTIL) (2.5 MG/3ML) 0.083% nebulizer solution 3 mL, 3 mL, Nebulization, Q6H  PRN, Romie Levee, MD, 3 mL at 03/21/22 0548   alum & mag hydroxide-simeth (MAALOX/MYLANTA) 200-200-20 MG/5ML suspension 30 mL, 30 mL, Oral, Q6H PRN, Karie Soda, MD   ascorbic acid (VITAMIN C) tablet 500 mg, 500 mg, Oral, BID, Karie Soda, MD, 500 mg at 04/08/22 2131   azelastine (ASTELIN) 0.1 % nasal spray 1 spray, 1 spray, Each Nare, BID PRN, Romie Levee, MD, 1 spray at 03/31/22 1648   bisoprolol (ZEBETA) tablet 10 mg, 10 mg, Oral, QHS, Romie Levee, MD, 10 mg at 04/08/22 2131   Chlorhexidine Gluconate Cloth 2 % PADS 6 each, 6 each, Topical, Q0600, Romie Levee, MD, 6 each at 04/08/22 2100  diphenhydrAMINE (BENADRYL) injection 12.5-25 mg, 12.5-25 mg, Intravenous, Q6H PRN, Romie Levee, MD, 25 mg at 04/01/22 2345   enoxaparin (LOVENOX) injection 40 mg, 40 mg, Subcutaneous, Q24H, Gross, Viviann Spare, MD, 40 mg at 04/08/22 1032   famotidine (PEPCID) tablet 20 mg, 20 mg, Oral, BID, Romie Levee, MD, 20 mg at 04/08/22 2132   feeding supplement (ENSURE ENLIVE / ENSURE PLUS) liquid 237 mL, 237 mL, Oral, TID BM, Romie Levee, MD, 237 mL at 04/08/22 2019   ferrous sulfate tablet 325 mg, 325 mg, Oral, BID WC, Karie Soda, MD, 325 mg at 04/09/22 0830   HYDROmorphone (DILAUDID) injection 0.5-1 mg, 0.5-1 mg, Intravenous, Q6H PRN, Romie Levee, MD   ibuprofen (ADVIL) tablet 400-800 mg, 400-800 mg, Oral, Q8H PRN, Karie Soda, MD, 800 mg at 04/05/22 0807   lip balm (CARMEX) ointment 1 application., 1 application., Topical, BID, Romie Levee, MD, 1 application. at 04/08/22 2132   loperamide (IMODIUM) capsule 2 mg, 2 mg, Oral, TID with meals, Romie Levee, MD, 2 mg at 04/09/22 0830   LORazepam (ATIVAN) tablet 0.5-1 mg, 0.5-1 mg, Oral, Q12H PRN, Karie Soda, MD   magic mouthwash, 15 mL, Oral, QID PRN, Romie Levee, MD   MEDLINE mouth rinse, 15 mL, Mouth Rinse, q12n4p, Romie Levee, MD, 15 mL at 04/07/22 1722   methocarbamol (ROBAXIN) tablet 1,000 mg, 1,000 mg, Oral, TID, Romie Levee, MD, 1,000 mg at 04/08/22 2131   micafungin (MYCAMINE) 100 mg in sodium chloride 0.9 % 100 mL IVPB, 100 mg, Intravenous, Q24H, Kathlynn Grate, DO, Last Rate: 105 mL/hr at 04/08/22 1336, 100 mg at 04/08/22 1336   multivitamin with minerals tablet 1 tablet, 1 tablet, Oral, Daily, Romie Levee, MD, 1 tablet at 04/08/22 1030   ondansetron (ZOFRAN) injection 4 mg, 4 mg, Intravenous, Q6H PRN, 4 mg at 03/24/22 1925 **OR** ondansetron (ZOFRAN) 8 mg in sodium chloride 0.9 % 50 mL IVPB, 8 mg, Intravenous, Q6H PRN, Romie Levee, MD   ondansetron (ZOFRAN-ODT) disintegrating tablet 4 mg, 4 mg, Oral, Q6H PRN **OR** [DISCONTINUED] ondansetron (ZOFRAN) injection 4 mg, 4 mg, Intravenous, Q6H PRN, Darnell Level, MD   pantoprazole (PROTONIX) EC tablet 40 mg, 40 mg, Oral, Daily, Romie Levee, MD, 40 mg at 04/08/22 1030   phenol (CHLORASEPTIC) mouth spray 2 spray, 2 spray, Mouth/Throat, PRN, Karie Soda, MD   piperacillin-tazobactam (ZOSYN) IVPB 3.375 g, 3.375 g, Intravenous, Q8H, Romie Levee, MD, Last Rate: 12.5 mL/hr at 04/09/22 0833, 3.375 g at 04/09/22 0833   polycarbophil (FIBERCON) tablet 625 mg, 625 mg, Oral, BID, Karie Soda, MD, 625 mg at 04/08/22 1035   prochlorperazine (COMPAZINE) injection 5-10 mg, 5-10 mg, Intravenous, Q4H PRN, Karie Soda, MD   simethicone (MYLICON) 40 MG/0.6ML suspension 80 mg, 80 mg, Oral, QID PRN, Romie Levee, MD, 80 mg at 03/24/22 2209   sodium chloride flush (NS) 0.9 % injection 10-40 mL, 10-40 mL, Intracatheter, Q12H, Romie Levee, MD, 10 mL at 04/08/22 2133   sodium chloride flush (NS) 0.9 % injection 10-40 mL, 10-40 mL, Intracatheter, PRN, Romie Levee, MD, 10 mL at 03/25/22 2138   sodium chloride flush (NS) 0.9 % injection 10-40 mL, 10-40 mL, Intracatheter, Q12H, Romie Levee, MD, 10 mL at 04/08/22 2133   sodium chloride flush (NS) 0.9 % injection 10-40 mL, 10-40 mL, Intracatheter, PRN, Romie Levee, MD   traMADol Janean Sark) tablet 50-100 mg, 50-100  mg, Oral, Q6H PRN, Karie Soda, MD, 50 mg at 04/07/22 2127   traZODone (DESYREL) tablet 50 mg, 50 mg, Oral, QHS PRN, Maisie Fus,  Helmut Muster, MD, 50 mg at 04/07/22 2127   Patients Current Diet:  Diet Order                  Diet regular Room service appropriate? Yes with Assist; Fluid consistency: Thin  Diet effective now                         Precautions / Restrictions Precautions Precautions: Fall Precaution Comments: drains, colostomy, rectal drainage Restrictions Weight Bearing Restrictions: No    Has the patient had 2 or more falls or a fall with injury in the past year? 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 Independent   Indoor Mobility: Did the patient need assistance with walking from room to room (with or without device)? Independent   Stairs: Did the patient need assistance with internal or external stairs (with or without device)? Independent   Functional Cognition: Did the patient need help planning regular tasks such as shopping or remembering to take medications? Independent   Patient Information Are you of Hispanic, Latino/a,or Spanish origin?: A. No, not of Hispanic, Latino/a, or Spanish origin What is your race?: A. White Do you need or want an interpreter to communicate with a doctor or health care staff?: 0. No   Patient's Response To:  Health Literacy and Transportation Is the patient able to respond to health literacy and transportation needs?: Yes Health Literacy - How often do you need to have someone help you when you read instructions, pamphlets, or other written material from your doctor or pharmacy?: Never In the past 12 months, has lack of transportation kept you from medical appointments or from getting medications?: No In the past 12 months, has lack of transportation  kept you from meetings, work, or from getting things needed for daily living?: No   Journalist, newspaper / Equipment Home Assistive Devices/Equipment: Eyeglasses Home Equipment: None   Prior Device Use: Indicate devices/aids used by the patient prior to current illness, exacerbation or injury? None of the above   Current Functional Level Cognition   Overall Cognitive Status: Within Functional Limits for tasks assessed Orientation Level: Oriented X4 General Comments: Very pleasant and motivated    Extremity Assessment (includes Sensation/Coordination)   Upper Extremity Assessment: Overall WFL for tasks assessed RUE Deficits / Details: WFL ROM, 5/5 strength RUE Sensation: WNL RUE Coordination: WNL LUE Deficits / Details: WFL ROM, 5/5 strength LUE Sensation: WNL LUE Coordination: WNL  Lower Extremity Assessment: Defer to PT evaluation RLE Deficits / Details: grossly WFL for ROM, 3/5 for MMT with hip flex/ext, 4/5 dorsiflexion, 3/5 plantarflexion RLE Sensation: WNL RLE Coordination: decreased gross motor (secondary to weakness) LLE Deficits / Details: grossly WFL for ROM, 3/5 for MMT with hip flex/ext, 4/5 dorsiflexion, 3/5 plantarflexion LLE Sensation: WNL LLE Coordination: decreased gross motor (secondary to weakness)     ADLs   Overall ADL's : Needs assistance/impaired Eating/Feeding: Set up, Sitting Grooming: Standing, Oral care, Set up, Min guard Grooming Details (indicate cue type and reason): Pt stood at sink with Min gaurd assist for steadying and full setup for grooming tasks as pt required unilateral UE support on RW to fee safe. Chair placed behind pt in case of dizziness and pt did require a sitting rest directly after toothbrushing.  After pt took ~4 steps from EOB-->sink  with RW and Min As, stood for 1 grooming task and lowered to chair, pt stated a RPE score of 8/10, suggesting near maximum effort. Upper Body Bathing: Sitting, Minimal assistance Upper Body Bathing  Details (indicate cue type and reason): sitting EOB Lower Body Bathing: Moderate assistance, Sit to/from stand, Cueing for compensatory techniques Lower Body Bathing Details (indicate cue type and reason): Pt unable to sit in figure 4 position or lean foward at this time.  Stretching to hip joint in figure 4 completed in prep for LE dressing. Pt may benefit from adaptive eqiupment. Upper Body Dressing : Minimal assistance, Sitting Lower Body Dressing: Maximal assistance, Sit to/from stand Lower Body Dressing Details (indicate cue type and reason): min assist for sit to stand from elevated bed. Total assist for shoes and socks at this time due to fatigue and inablility to access feet due to pain. Toilet Transfer: Minimal assistance, Ambulation Toilet Transfer Details (indicate cue type and reason): Pt stood from EOB x 1 to RW and stood from recliner x 2 to RW all with Min As to power up. Pt ambulated ~4' with RW to sink with Min As. Pt performed stand pivot from recliner back to EOB with Min As and RW. Toileting- Clothing Manipulation and Hygiene: Total assistance Functional mobility during ADLs: Minimal assistance, Rolling walker (2 wheels) General ADL Comments: Pt overall doing UE adls with min assist to set up and LE adls with mod to max assist. Pt very dizzy when standing today and was orthostatic.  See assessment section for BPs.     Mobility   Overal bed mobility: Needs Assistance Bed Mobility: Sit to Supine Rolling: Supervision Sidelying to sit: Min guard Supine to sit: Min assist, HOB elevated Sit to supine: Min guard General bed mobility comments: Min guard to assist with trunk position in bed, but pt able to return to supine, controlling trunk and LEs with supervision.     Transfers   Overall transfer level: Needs assistance Equipment used: Rolling walker (2 wheels) Transfers: Sit to/from Stand Sit to Stand: Min guard Bed to/from chair/wheelchair/BSC transfer type:: Stand  pivot Stand pivot transfers: Min assist, From elevated surface Step pivot transfers: Min assist General transfer comment: min guard to power to stand, VC for hand placement, denies dizziness or lightheadedness, BUE assisting to power up     Ambulation / Gait / Stairs / Wheelchair Mobility   Ambulation/Gait Ambulation/Gait assistance: Editor, commissioning (Feet): 5 Feet (x4) Assistive device: Rolling walker (2 wheels) Gait Pattern/deviations: Step-to pattern, Trunk flexed, Decreased stride length General Gait Details: step-to pattern with flexed trunk, slow steps and slightly shaky, denies dizziness or lightheadedness, fatigues with ambulation and limited by wound draining Gait velocity: decreased     Posture / Balance Dynamic Sitting Balance Sitting balance - Comments: fatigues easily Balance Overall balance assessment: Needs assistance Sitting-balance support: No upper extremity supported Sitting balance-Leahy Scale: Fair Sitting balance - Comments: fatigues easily Postural control: Posterior lean Standing balance support: During functional activity, Reliant on assistive device for balance, Single extremity supported Standing balance-Leahy Scale: Poor Standing balance comment: reliant on walker     Special needs/care consideration Continuous Drip IV  IV mycamine and IV zosyn, Skin new colostomy, 2x abdominal drains, 1x perianal drain, and Special service needs WON consult    Previous Home Environment (from acute therapy documentation) Living Arrangements: Spouse/significant other Available Help at Discharge: Family Type of Home: House Home Access: Stairs to enter Entrance Stairs-Number of Steps: 3 Home Care Services: No  Discharge Living Setting Plans for Discharge Living Setting: Patient's home, Lives with (comment) (partner, Greig Castilla) Type of Home at Discharge: House Discharge Home Layout: One level Discharge Home Access: Stairs to enter Entrance Stairs-Number of Steps:  3 Discharge Bathroom Shower/Tub: Walk-in shower Discharge Bathroom Toilet: Standard Discharge Bathroom Accessibility: Yes How Accessible: Accessible via walker Does the patient have any problems obtaining your medications?: No   Social/Family/Support Systems Patient Roles: Partner Anticipated Caregiver: Josefa Half Anticipated Caregiver's Contact Information: [phone] Caregiver Availability: 24/7 Discharge Plan Discussed with Primary Caregiver: Yes Is Caregiver In Agreement with Plan?: Yes Does Caregiver/Family have Issues with Lodging/Transportation while Pt is in Rehab?: No   Goals Patient/Family Goal for Rehab: PT/OT supervision to mod I, SLP n/a Expected length of stay: 14-16 days Additional Information: pt with 3 drains, IV abx and IV antifungal, new ostomy Pt/Family Agrees to Admission and willing to participate: Yes Program Orientation Provided & Reviewed with Pt/Caregiver Including Roles  & Responsibilities: Yes Additional Information Needs: wound ostomy nurse consult  Barriers to Discharge: Medical stability, IV antibiotics, Insurance for SNF coverage   Decrease burden of Care through IP rehab admission: n/a   Possible need for SNF placement upon discharge: No   Patient Condition: I have reviewed medical records from [place], spoken with CSW, and patient. I met with patient at the bedside and discussed via phone for inpatient rehabilitation assessment.  Patient will benefit from ongoing PT and OT, can actively participate in 3 hours of therapy a day 5 days of the week, and can make measurable gains during the admission.  Patient will also benefit from the coordinated team approach during an Inpatient Acute Rehabilitation admission.  The patient will receive intensive therapy as well as Rehabilitation physician, nursing, social worker, and care management interventions.  Due to bowel management, safety, skin/wound care, disease management, medication administration,  pain management, and patient education the patient requires 24 hour a day rehabilitation nursing.  The patient is currently min assist with mobility and mod/max with basic ADLs.  Discharge setting and therapy post discharge at home with home health is anticipated.  Patient has agreed to participate in the Acute Inpatient Rehabilitation Program and will admit today.   Preadmission Screen Completed By:  Stephania Fragmin, PT, DPT 04/09/2022 10:00 AM ______________________________________________________________________   Discussed status with Dr. Carlis Abbott on 04/09/22  at 10:18 AM  and received approval for admission today.   Admission Coordinator:  Stephania Fragmin, PT, DPT time 10:18 AM Dorna Bloom 04/09/22     Assessment/Plan: Diagnosis: Debility Does the need for close, 24 hr/day Medical supervision in concert with the patient's rehab needs make it unreasonable for this patient to be served in a less intensive setting? 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 Yes Does the patient require coordinated care of a physician, rehab nurse, PT, OT to address physical and functional deficits in the context of the above medical diagnosis(es)? Yes Addressing deficits in the following areas: balance, endurance, locomotion, strength, transferring, bowel/bladder control, bathing, dressing, feeding, grooming, toileting, and psychosocial support Can the patient actively participate in an intensive therapy program of at least 3 hrs of therapy 5 days a week? Yes The potential for patient to make measurable gains while on inpatient rehab is excellent Anticipated functional outcomes upon  discharge from inpatient rehab: modified independent PT, modified independent OT Estimated rehab length of stay to reach the above functional goals is: 10-14 days Anticipated discharge destination: Home 10. Overall Rehab/Functional Prognosis: excellent     MD Signature: Sula Soda, MD

## 2022-04-09 NOTE — Progress Notes (Signed)
INPATIENT REHABILITATION ADMISSION NOTE   Arrival Method: EMS     Mental Orientation: Oriented X 4   Assessment: DONE   Skin: Done   IV'S: PICC on RUE   Pain: none   Tubes and Drains: See assessment   Safety Measures: reviewed with pt and family   Vital Signs: done   Height and Weight: done   Rehab Orientation: done   Family: at bedside    Notes: done  Gerald Stabs, RN

## 2022-04-09 NOTE — H&P (Signed)
Physical Medicine and Rehabilitation Admission H&P    CC: Debility  HPI: Tyler [name]. [name] is a 71 year old right-handed male with history of anxiety, Thrombocytopeni, hypertension, quit smoking 33 years ago.  Per chart review lives with significant other.  1 level home 3 steps to entry.  Independent prior to admission.  Patient with recent transanal hemorrhoidal dearterialization 4/21 outpatient procedure per Dr. Leighton Ruff.  Postoperative course complicated by increasing abdominal pain nausea and fever that persisted x2 days.  He presented to the emergency department 03/09/2022.  Chemistry showed sodium 132 potassium 3.4 BUN 27 creatinine 1.27, his white count was upper end of normal 10.1.  CT scan of the abdomen pelvis performed showing thickening of the rectal wall.  There is also gas and fluid within the rectal wall and in the presacral space concerning for possible rectal perforation.  He was made n.p.o. maintained on TPN for nutritional support with broad-spectrum antibiotics.  His white count increased to 20,000.  He underwent anal exam under anesthesia with debridement, incision and drainage of anal canal proctoscopy 03/15/2022.  Hospital course persistent fevers transferred to progressive care and underwent diagnostic laparoscopy, abdominal washout and drain placement with creation of diverting colostomy 03/20/2022.  Follow-up CT abdomen pelvis 03/26/2022 showed extensive edema/inflammation and soft tissue gas in the pelvic floor and presacral space as well as a 5.3 x 3.5 x 7.2 cm collection of gas and fluid in the left upper quadrant anterior to the kidney and medial to the spleen.  Infectious disease Dr. Juleen China consulted in regards to suspect intra-abdominal infection and currently maintained on intravenous Zosyn for coverage as well as micafungin x6 weeks from 5/16 with repeat CT abdomen/pelvis and left thigh and 1 to 2 weeks..  Latest CT abdomen pelvis 04/05/2022 showed improvement in small  bilateral pleural effusions and bibasilar atelectasis as well as improvement in body wall edema.  Small air-fluid collection anterior to the left kidney with possibly developing small fluid collection in the left pelvic sidewall 5.2 x 2.2 cm.  No new fluid collections adjacent to the drains.  He was cleared to begin Lovenox for DVT prophylaxis.  Left lower quadrant JP drain inadvertently came out around 6 AM 04/06/2022 with fall per surgery advise just continue to monitor with dry dressing applied.  Hospital course anemia 8.0.  His white blood cell count has improved to 5300.  Bouts of urinary retention he did require a Foley tube planning voiding trial.  Diet has been advanced to regular and TPN discontinued.  Therapy evaluations completed due to patient's decreased functional mobility was admitted for a comprehensive rehab program. Currently expressed anxiety about CIR.  Review of Systems  Constitutional:  Positive for fever. Negative for chills.  HENT:  Negative for hearing loss.   Eyes:  Negative for blurred vision and double vision.  Respiratory:  Negative for cough and shortness of breath.   Cardiovascular:  Negative for chest pain, palpitations and leg swelling.  Gastrointestinal:  Positive for abdominal pain, diarrhea, nausea and vomiting.       GERD  Genitourinary:  Positive for urgency. Negative for dysuria, flank pain and hematuria.  Musculoskeletal:  Positive for joint pain and myalgias.  Skin:  Negative for rash.  Psychiatric/Behavioral:         Anxiety  All other systems reviewed and are negative. 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), bilateral    Chronic  pruritic rash in adult 2004   hx shingles  w/ residual recurrent rash, take valtrex daily   ED (erectile dysfunction)    GERD (gastroesophageal reflux disease)    H/O pityriasis rosea    History of basal cell carcinoma  (BCC) excision    2022 left calf s/p mohs   History of pertussis 2018   History of SCC (squamous cell carcinoma) of skin    removed from head   History of thrombocytopenia 12/03/2010   Hx of adenomatous polyp of colon 04/07/2017   Hypertension    Mild asthma    followed by pcp   OA (osteoarthritis)    knees, fingers   Prolapsed internal hemorrhoids, grade 3    hx  external hemorroid banding   Wears glasses    Past Surgical History:  Procedure Laterality Date   COLONOSCOPY WITH PROPOFOL  04/01/2017   by dr Carlean Purl   DIRECT LARYNGOSCOPY  1986   left vocal fold bx   (non-cancerous granuloma)   FINGER SURGERY Left 2017   ligament and tendon repair left index finger   HEMORRHOID SURGERY N/A 03/15/2022   Procedure: ANAL EXAM UNDER ANESTHESIA WITH PROCTOSCOPY  AND DEBRIDEMENT;  Surgeon: Leighton Ruff, MD;  Location: [facility];  Service: General;  Laterality: N/A;   INGUINAL HERNIA REPAIR Right 05/25/2018   Procedure: RIGHT INGUINAL HERNIA REPAIR WITH MESH;  Surgeon: Jovita Kussmaul, MD;  Location: [place];  Service: General;  Laterality: Right;   INSERTION OF MESH Right 05/25/2018   Procedure: INSERTION OF MESH;  Surgeon: Jovita Kussmaul, MD;  Location: [place];  Service: General;  Laterality: Right;   KNEE ARTHROSCOPY W/ MENISCAL REPAIR Right 10/26/2005   '@[facility]'$   by Dr  Wynelle Link   LAPAROSCOPIC APPENDECTOMY  03/22/2011   '@[facility]'$    LAPAROSCOPY N/A 03/20/2022   Procedure: DIAGNOSTIC LAPAROSCOPY; ABDOMINAL [place] OUT AND DRAIN PLACEMENT; CREATION OF DIVERTING COLOSTOMY; ANAL EXAM UNDER ANESTHESIA;  Surgeon: Leighton Ruff, MD;  Location: [facility];  Service: General;  Laterality: N/A;   MOHS SURGERY  05/2021   left lower leg   TONSILLECTOMY  1958   TRANSANAL HEMORRHOIDAL DEARTERIALIZATION N/A 03/05/2022   Procedure: TRANSANAL HEMORRHOIDAL DEARTERIALIZATION;  Surgeon: Leighton Ruff, MD;  Location: [place];  Service: General;  Laterality: N/A;    Family History  Problem Relation Age of Onset   AAA (abdominal aortic aneurysm) Mother    Glaucoma Sister    Stomach cancer Paternal Grandfather    Colon cancer Neg Hx    Social History:  reports that he quit smoking about 33 years ago. His smoking use included cigarettes. He has never used smokeless tobacco. He reports current alcohol use of about 28.0 standard drinks per week. He reports that he does not use drugs. Allergies:  Allergies  Allergen Reactions   Fluconazole Hives, Rash and Other (See Comments)    Shingles activated    Griseofulvin Anaphylaxis, Swelling, Rash and Other (See Comments)    Throat swelling     Penicillins Rash and Other (See Comments)    ?fever, tolerates Cefepime TOLERATING ZOSYN [id number]   Sulfa Antibiotics Other (See Comments)    Joints ache, swell and caused inflammation    Oxycodone Nausea Only and Other (See Comments)    Nauseous to the point of almost vomiting   Mometasone Furo-Formoterol Fum Other (See Comments)    Lack of therapeutic effect    Retapamulin Rash   Medications Prior to Admission  Medication Sig Dispense Refill  acetaminophen (TYLENOL) 500 MG tablet Take 2 tablets (1,000 mg total) by mouth every 6 (six) hours as needed for mild pain. 30 tablet 0   albuterol (VENTOLIN HFA) 108 (90 Base) MCG/ACT inhaler Inhale 2 puffs into the lungs every 6 (six) hours as needed for wheezing or shortness of breath.     amLODipine (NORVASC) 5 MG tablet TAKE 1 TABLET BY MOUTH DAILY (Patient taking differently: Take 5 mg by mouth in the morning.) 90 tablet 1   ascorbic acid (VITAMIN C) 500 MG tablet Take 1 tablet (500 mg total) by mouth 2 (two) times daily.     azelastine (ASTELIN) 0.1 % nasal spray Place 1 spray into both nostrils 2 (two) times daily as needed for rhinitis. Use in each nostril as directed     bisoprolol (ZEBETA) 10 MG tablet Take 10 mg by mouth at bedtime.     diphenhydrAMINE (BENADRYL) 25 mg capsule Take 1 capsule (25 mg total)  by mouth every 6 (six) hours as needed for itching, allergies or sleep (anxiety). 30 capsule 0   feeding supplement (ENSURE ENLIVE / ENSURE PLUS) LIQD Take 237 mLs by mouth 3 (three) times daily between meals. 237 mL 12   ferrous sulfate 325 (65 FE) MG tablet Take 1 tablet (325 mg total) by mouth 2 (two) times daily with a meal.  3   ibuprofen (ADVIL) 400 MG tablet Take 1-2 tablets (400-800 mg total) by mouth every 8 (eight) hours as needed for fever, mild pain or cramping. 30 tablet 0   loperamide (IMODIUM) 2 MG capsule Take 1 capsule (2 mg total) by mouth with breakfast, with lunch, and with evening meal. 30 capsule 0   methocarbamol 1000 MG TABS Take 1,000 mg by mouth every 8 (eight) hours as needed for muscle spasms.     micafungin in sodium chloride 0.9 % 100 mL '100mg'$  IV q24h     montelukast (SINGULAIR) 10 MG tablet TAKE 1 TABLET BY MOUTH EVERY NIGHT AT BEDTIME (Patient taking differently: Take 10 mg by mouth at bedtime.) 90 tablet 3   Multiple Vitamin (MULTIVITAMIN WITH MINERALS) TABS tablet Take 1 tablet by mouth daily.     NEXIUM 24HR 20 MG capsule Take 20 mg by mouth daily before breakfast.     ondansetron (ZOFRAN-ODT) 4 MG disintegrating tablet Take 1 tablet (4 mg total) by mouth every 6 (six) hours as needed for nausea. 20 tablet 0   piperacillin-tazobactam (ZOSYN) 3.375 GM/50ML IVPB Inject 50 mLs (3.375 g total) into the vein every 8 (eight) hours. 50 mL    polycarbophil (FIBERCON) 625 MG tablet Take 1 tablet (625 mg total) by mouth 2 (two) times daily.     sodium chloride flush (NS) 0.9 % SOLN 10-40 mLs by Intracatheter route every 12 (twelve) hours.     sodium chloride flush (NS) 0.9 % SOLN 10-40 mLs by Intracatheter route as needed (flush).     tadalafil (CIALIS) 10 MG tablet Take 10 mg by mouth daily as needed for erectile dysfunction.     traMADol (ULTRAM) 50 MG tablet Take 1-2 tablets (50-100 mg total) by mouth every 6 (six) hours as needed for moderate pain or severe pain. 30  tablet    traZODone (DESYREL) 50 MG tablet Take 1 tablet (50 mg total) by mouth at bedtime as needed for sleep.     valACYclovir (VALTREX) 500 MG tablet Take 500 mg by mouth in the morning.     Home: Home Living Family/patient expects to be discharged to::  Inpatient rehab Living Arrangements: Spouse/significant other Available Help at Discharge: Family Type of Home: House Home Access: Stairs to enter Technical brewer of Steps: 3 Home Equipment: None   Functional History: Prior Function Prior Level of Function : Independent/Modified Independent   Functional Status:  Mobility: Bed Mobility Overal bed mobility: Needs Assistance Bed Mobility: Sit to Supine Rolling: Supervision Sidelying to sit: Min guard Supine to sit: Min assist, HOB elevated Sit to supine: Min guard General bed mobility comments: Min guard to assist with trunk position in bed, but pt able to return to supine, controlling trunk and LEs with supervision. Transfers Overall transfer level: Needs assistance Equipment used: Rolling walker (2 wheels) Transfers: Sit to/from Stand Sit to Stand: Min guard Bed to/from chair/wheelchair/BSC transfer type:: Stand pivot Stand pivot transfers: Min assist, From elevated surface Step pivot transfers: Min assist General transfer comment: min guard to power to stand, VC for hand placement, denies dizziness or lightheadedness, BUE assisting to power up Ambulation/Gait Ambulation/Gait assistance: Min assist Gait Distance (Feet): 5 Feet (x4) Assistive device: Rolling walker (2 wheels) Gait Pattern/deviations: Step-to pattern, Trunk flexed, Decreased stride length General Gait Details: step-to pattern with flexed trunk, slow steps and slightly shaky, denies dizziness or lightheadedness, fatigues with ambulation and limited by wound draining Gait velocity: decreased   ADL: ADL Overall ADL's : Needs assistance/impaired Eating/Feeding: Set up, Sitting Grooming: Standing,  Oral care, Set up, Min guard Grooming Details (indicate cue type and reason): Pt stood at sink with Min gaurd assist for steadying and full setup for grooming tasks as pt required unilateral UE support on RW to fee safe. Chair placed behind pt in case of dizziness and pt did require a sitting rest directly after toothbrushing.  After pt took ~4 steps from EOB-->sink with RW and Min As, stood for 1 grooming task and lowered to chair, pt stated a RPE score of 8/10, suggesting near maximum effort. Upper Body Bathing: Sitting, Minimal assistance Upper Body Bathing Details (indicate cue type and reason): sitting EOB Lower Body Bathing: Moderate assistance, Sit to/from stand, Cueing for compensatory techniques Lower Body Bathing Details (indicate cue type and reason): Pt unable to sit in figure 4 position or lean foward at this time.  Stretching to hip joint in figure 4 completed in prep for LE dressing. Pt may benefit from adaptive eqiupment. Upper Body Dressing : Minimal assistance, Sitting Lower Body Dressing: Maximal assistance, Sit to/from stand Lower Body Dressing Details (indicate cue type and reason): min assist for sit to stand from elevated bed. Total assist for shoes and socks at this time due to fatigue and inablility to access feet due to pain. Toilet Transfer: Minimal assistance, Ambulation Toilet Transfer Details (indicate cue type and reason): Pt stood from EOB x 1 to RW and stood from recliner x 2 to RW all with Min As to power up. Pt ambulated ~4' with RW to sink with Min As. Pt performed stand pivot from recliner back to EOB with Min As and RW. Toileting- Clothing Manipulation and Hygiene: Total assistance Functional mobility during ADLs: Minimal assistance, Rolling walker (2 wheels) General ADL Comments: Pt overall doing UE adls with min assist to set up and LE adls with mod to max assist. Pt very dizzy when standing today and was orthostatic.  See assessment section for BPs.    Cognition: Cognition Overall Cognitive Status: Within Functional Limits for tasks assessed Orientation Level: Oriented X4 Cognition Arousal/Alertness: Awake/alert Behavior During Therapy: WFL for tasks assessed/performed Overall Cognitive Status: Within Functional  Limits for tasks assessed General Comments: Very pleasant and motivated    Physical Exam: There were no vitals taken for this visit. Gen: no distress, normal appearing HEENT: oral mucosa pink and moist, NCAT Cardio: Reg rate Chest: normal effort, normal rate of breathing Abdominal:     Comments: Colostomy is viable with stool and air  Skin: pelvis JP in place, RP former drain site erythematous Psych: anxious about CIR, pleasant and with appropriate affect Neurological:     Comments: Patient is alert.  Sitting up in bed.  Oriented x3 and follows commands. No focal neurologic deficits  Results for orders placed or performed during the hospital encounter of 03/09/22 (from the past 48 hour(s))  Hepatic function panel     Status: Abnormal   Collection Time: 04/08/22  5:02 AM  Result Value Ref Range   Total Protein 6.6 6.5 - 8.1 g/dL   Albumin 2.0 (L) 3.5 - 5.0 g/dL   AST 26 15 - 41 U/L   ALT 33 0 - 44 U/L   Alkaline Phosphatase 357 (H) 38 - 126 U/L   Total Bilirubin 0.8 0.3 - 1.2 mg/dL   Bilirubin, Direct 0.2 0.0 - 0.2 mg/dL   Indirect Bilirubin 0.6 0.3 - 0.9 mg/dL    Comment: Performed at [facility], [place] [address], [place], [place] [address]  CBC     Status: Abnormal   Collection Time: 04/09/22  2:43 AM  Result Value Ref Range   WBC 5.3 4.0 - 10.5 K/uL   RBC 2.53 (L) 4.22 - 5.81 MIL/uL   Hemoglobin 8.0 (L) 13.0 - 17.0 g/dL   HCT 23.9 (L) 39.0 - 52.0 %   MCV 94.5 80.0 - 100.0 fL   MCH 31.6 26.0 - 34.0 pg   MCHC 33.5 30.0 - 36.0 g/dL   RDW 14.8 11.5 - 15.5 %   Platelets 311 150 - 400 K/uL   nRBC 0.0 0.0 - 0.2 %    Comment: Performed at [facility], [place] [address],  [place],  [address]   No results found.    There were no vitals taken for this visit.  Medical Problem List and Plan: 1. Functional deficits secondary to debility secondary to pelvic abscess.  Status post laparoscopy, abdominal washout drain placement with creation of diverting colostomy 03/20/2022  -patient may shower but incisions/drains should be covered  -ELOS/Goals: 10-14 days modI  -Admit to CIR 2.  Antithrombotics: -DVT/anticoagulation:  Pharmaceutical: Lovenox.  Venous Doppler negative  -antiplatelet therapy: N/A 3. Pain Management: Robaxin 1000 mg 3 times daily, tramadol as needed 4. Mood: Ativan as needed as well as trazodone  -antipsychotic agents: N/A 5. Neuropsych: This patient is capable of making decisions on his own behalf. 6. Skin/Wound Care: Routine skin checks with colostomy education 7. Fluids/Electrolytes/Nutrition: Routine in and outs with follow-up chemistries 8.  ID/intra-abdominal infection.  Continue micafungin 100 mg daily as well as Zosyn 3.375 g every 8 hours.  Plan is for 6 weeks starting date 5/16 ending 05/11/2022 and will need repeat CT abdomen/pelvis on left thigh in 1 to 2 weeks 9.  Postoperative anemia.  Continue iron supplement.  Follow-up CBC 10.  Hypertension.  Continue Zebeta 10 mg nightly,   Monitor with increased mobility 11.  Decreased nutritional storage.  Dietary follow-up. Continue Prosource.  12.  Urinary retention.  Foley tube removed and urinating.  Check PVR  I have personally performed a face to face diagnostic evaluation, including, but not limited to relevant history  and physical exam findings, of this patient and developed relevant assessment and plan.  Additionally, I have reviewed and concur with the physician assistant's documentation above.  Leeroy Cha, MD   Lavon Paganini Angiulli, PA-C

## 2022-04-10 ENCOUNTER — Inpatient Hospital Stay (HOSPITAL_COMMUNITY): Payer: BC Managed Care – PPO

## 2022-04-10 DIAGNOSIS — R509 Fever, unspecified: Secondary | ICD-10-CM

## 2022-04-10 DIAGNOSIS — A4151 Sepsis due to Escherichia coli [E. coli]: Secondary | ICD-10-CM

## 2022-04-10 DIAGNOSIS — B952 Enterococcus as the cause of diseases classified elsewhere: Secondary | ICD-10-CM

## 2022-04-10 DIAGNOSIS — Z433 Encounter for attention to colostomy: Secondary | ICD-10-CM

## 2022-04-10 DIAGNOSIS — A498 Other bacterial infections of unspecified site: Secondary | ICD-10-CM

## 2022-04-10 DIAGNOSIS — R5381 Other malaise: Principal | ICD-10-CM

## 2022-04-10 LAB — BASIC METABOLIC PANEL
Anion gap: 9 (ref 5–15)
BUN: 12 mg/dL (ref 8–23)
CO2: 24 mmol/L (ref 22–32)
Calcium: 8.3 mg/dL — ABNORMAL LOW (ref 8.9–10.3)
Chloride: 100 mmol/L (ref 98–111)
Creatinine, Ser: 0.92 mg/dL (ref 0.61–1.24)
GFR, Estimated: >60 mL/min (ref >60)
Glucose, Bld: 101 mg/dL — ABNORMAL HIGH (ref 70–99)
Potassium: 3.4 mmol/L — ABNORMAL LOW (ref 3.5–5.1)
Sodium: 133 mmol/L — ABNORMAL LOW (ref 135–145)

## 2022-04-10 LAB — RESPIRATORY PANEL BY PCR

## 2022-04-10 LAB — CBC
HCT: 23.1 % — ABNORMAL LOW (ref 39.0–52.0)
Hemoglobin: 8.2 g/dL — ABNORMAL LOW (ref 13.0–17.0)
MCH: 32.4 pg (ref 26.0–34.0)
MCHC: 35.5 g/dL (ref 30.0–36.0)
MCV: 91.3 fL (ref 80.0–100.0)
Platelets: 301 10*3/uL (ref 150–400)
RBC: 2.53 MIL/uL — ABNORMAL LOW (ref 4.22–5.81)
RDW: 14.5 % (ref 11.5–15.5)
WBC: 5.5 10*3/uL (ref 4.0–10.5)
nRBC: 0 % (ref 0.0–0.2)

## 2022-04-10 MED ORDER — PROSOURCE PLUS PO LIQD
30.0000 mL | Freq: Two times a day (BID) | ORAL | Status: DC
  Administered 2022-04-12 – 2022-04-16 (×3): 30 mL via ORAL
  Filled 2022-04-10 (×5): qty 30

## 2022-04-10 MED ORDER — IOHEXOL 300 MG/ML  SOLN
100.0000 mL | Freq: Once | INTRAMUSCULAR | Status: AC | PRN
Start: 2022-04-10 — End: 2022-04-10
  Administered 2022-04-10: 100 mL via INTRAVENOUS

## 2022-04-10 MED ORDER — CHLORHEXIDINE GLUCONATE CLOTH 2 % EX PADS
6.0000 | MEDICATED_PAD | Freq: Every day | CUTANEOUS | Status: DC
  Administered 2022-04-10 – 2022-04-23 (×15): 6 via TOPICAL

## 2022-04-10 MED ORDER — IOHEXOL 9 MG/ML PO SOLN
ORAL | Status: AC
  Filled 2022-04-10: qty 1000

## 2022-04-10 NOTE — Progress Notes (Signed)
PROGRESS NOTE   Subjective/Complaints:  Patient had a eventful night last night becoming febrile to 101.6, tachycardic to 115, and developing new onset cough with subjective shortness of breath.  Appears much better this am., labs overnight reassuring. Patient comfortably resting in bed.  Review of Systems  Constitutional:  Positive for diaphoresis and fever.  HENT:  Negative for congestion and sinus pain.   Eyes: Negative.   Respiratory:  Positive for cough and shortness of breath. Negative for wheezing.   Cardiovascular:  Negative for chest pain and palpitations.  Gastrointestinal:  Positive for diarrhea, nausea and vomiting.  Genitourinary:  Positive for urgency. Negative for dysuria.  Musculoskeletal:  Positive for joint pain and myalgias.  Neurological:  Negative for dizziness, weakness and headaches.  Endo/Heme/Allergies: Negative.   Psychiatric/Behavioral:  Negative for depression. The patient is nervous/anxious. The patient does not have insomnia.     Objective:   DG Chest 2 View  Result Date: 04/09/2022 CLINICAL DATA:  Shortness of breath. EXAM: CHEST - 2 VIEW COMPARISON:  03/28/2022. FINDINGS: Heart is enlarged and the mediastinal contour is stable. There is a small pleural effusion on the left with atelectasis. The right lung is clear. No pneumothorax. A right PICC line terminates over the superior vena cava. No acute osseous abnormality. IMPRESSION: Small left pleural effusion with atelectasis at the left lung base. Electronically Signed   By: Brett Fairy M.D.   On: 04/09/2022 22:33   CT ABDOMEN PELVIS W CONTRAST  Result Date: 04/10/2022 CLINICAL DATA:  Intra-abdominal abscess EXAM: CT ABDOMEN AND PELVIS WITH CONTRAST TECHNIQUE: Multidetector CT imaging of the abdomen and pelvis was performed using the standard protocol following bolus administration of intravenous contrast. RADIATION DOSE REDUCTION: This exam was  performed according to the departmental dose-optimization program which includes automated exposure control, adjustment of the mA and/or kV according to patient size and/or use of iterative reconstruction technique. CONTRAST:  173m OMNIPAQUE IOHEXOL 300 MG/ML  SOLN COMPARISON:  04/05/2021 FINDINGS: Lower chest: Small left pleural effusion and associated atelectasis or consolidation, similar to prior examination. Coronary artery disease. Hepatobiliary: No solid liver abnormality is seen. No gallstones, gallbladder wall thickening, or biliary dilatation. Pancreas: Unremarkable. No pancreatic ductal dilatation or surrounding inflammatory changes. Spleen: Normal in size without significant abnormality. Adrenals/Urinary Tract: Adrenal glands are unremarkable. Kidneys are normal, without renal calculi, solid lesion, or hydronephrosis. Bladder is unremarkable. Stomach/Bowel: Stomach is within normal limits. Appendix is surgically absent. Left lower quadrant diverting colostomy. Vascular/Lymphatic: Aortic atherosclerosis. No enlarged abdominal or pelvic lymph nodes. Reproductive: No mass or other significant abnormality. Other: Left lower quadrant colostomy. There is a complex left retroperitoneal fluid collection, with components that appear to communicate overlying the Gerota's fascia and left psoas musculature (series 3, image 39, 59). A previously seen surgical drain within the inferior aspect of this collection has been removed. The superior aspect of the collection is diminished in size, measuring 3.2 x 2.3 cm, previously 4.4 x 2.8 cm (series 3, image 37). Inferior aspect of the collection is similar, measuring 4.7 x 1.6 cm (series 3, image 59). Additional presacral air and fluid collection contains 1 surgical drain, others removed and is similar in size, measuring 7.9  x 2.5 cm (series 3, image 75). Anasarca. Musculoskeletal: No acute or significant osseous findings. IMPRESSION: 1. There is a complex left  retroperitoneal fluid collection, with superior and inferior components that appear to communicate overlying the Gerota's fascia and left psoas musculature. A previously seen surgical drain within the inferior aspect of this collection has been removed. The superior aspect of the collection is diminished in size, measuring 3.2 x 2.3 cm, previously 4.4 x 2.8 cm. Inferior aspect of the collection is similar, measuring 4.7 x 1.6 cm. 2. Additional presacral air and fluid collection contains one surgical drain, others removed and is similar in size, measuring 7.9 x 2.5 cm. 3. Left lower quadrant diverting colostomy. 4. Small left pleural effusion and associated atelectasis or consolidation, similar to prior examination. 5. Coronary artery disease. Aortic Atherosclerosis (ICD10-I70.0). Electronically Signed   By: Delanna Ahmadi M.D.   On: 04/10/2022 16:11   Recent Labs    04/09/22 1545 04/10/22 0250  WBC 4.3 5.5  HGB 8.1* 8.2*  HCT 24.1* 23.1*  PLT 296 301   Recent Labs    04/09/22 1925 04/10/22 0250  NA  --  133*  K  --  3.4*  CL  --  100  CO2  --  24  GLUCOSE  --  101*  BUN  --  12  CREATININE 1.05 0.92  CALCIUM  --  8.3*    Intake/Output Summary (Last 24 hours) at 04/10/2022 1745 Last data filed at 04/10/2022 1256 Gross per 24 hour  Intake 12.18 ml  Output 2390 ml  Net -2377.82 ml     Pressure Injury 04/09/22 Heel Left Deep Tissue Pressure Injury - Purple or maroon localized area of discolored intact skin or blood-filled blister due to damage of underlying soft tissue from pressure and/or shear. (Active)  04/09/22 1315  Location: Heel  Location Orientation: Left  Staging: Deep Tissue Pressure Injury - Purple or maroon localized area of discolored intact skin or blood-filled blister due to damage of underlying soft tissue from pressure and/or shear.  Wound Description (Comments):   Present on Admission: Yes     Pressure Injury 04/09/22 Heel Right Deep Tissue Pressure Injury - Purple  or maroon localized area of discolored intact skin or blood-filled blister due to damage of underlying soft tissue from pressure and/or shear. (Active)  04/09/22 1315  Location: Heel  Location Orientation: Right  Staging: Deep Tissue Pressure Injury - Purple or maroon localized area of discolored intact skin or blood-filled blister due to damage of underlying soft tissue from pressure and/or shear.  Wound Description (Comments):   Present on Admission: Yes    Physical Exam: Vital Signs Blood pressure 126/83, pulse (!) 101, temperature 98.2 F (36.8 C), resp. rate 16, height '5\' 9"'$  (1.753 m), weight 75.1 kg, SpO2 99 %.   General: Alert and oriented x 3, No apparent distress HEENT: Head is normocephalic, atraumatic, PERRLA, EOMI, sclera anicteric, oral mucosa pink and moist, dentition intact, ext ear canals clear,  Neck: Supple without JVD or lymphadenopathy Heart: Reg rate and rhythm. No murmurs rubs or gallops Chest: CTA bilaterally without wheezes, rales, or rhonchi; no distress Abdomen: Soft, non-tender, non-distended, bowel sounds positive. Colostomy viable with stool and air.  Stool is partially formed. Comments: pelvis JP in place, had second RP former drain site which inadvertently came out.  Pelvis JP drain x2 with small amount of yellow-white drainage. Skin: Slightly erythematous, but skin intact at former drain site. Stoma aroma colostomy intact. Heel protectors are on  both heels. Extremities: No clubbing, cyanosis, or edema. Pulses are 2+,  Neuro: Patient is alert and oriented x3, sitting up in bed, follows commands, No focal deficits. Musculoskeletal: FROM all 4 extremities with exception of having a difficult time crossing one leg over the other. Strength: 5/5 overall, BUE and BLE.  Sensory: no deficits. Psych: Pt's affect is appropriate. Pt is cooperative.  Assessment/Plan: 1. Functional deficits which require 3+ hours per day of interdisciplinary therapy in a comprehensive  inpatient rehab setting. Physiatrist is providing close team supervision and 24 hour management of active medical problems listed below. Physiatrist and rehab team continue to assess barriers to discharge/monitor patient progress toward functional and medical goals  Care Tool:  Bathing    Body parts bathed by patient: Right arm, Left arm, Chest, Abdomen, Front perineal area, Face   Body parts bathed by helper: Right upper leg, Left upper leg, Right lower leg, Left lower leg     Bathing assist Assist Level: Maximal Assistance - Patient 24 - 49%     Upper Body Dressing/Undressing Upper body dressing   What is the patient wearing?: Hospital gown only    Upper body assist Assist Level: Moderate Assistance - Patient 50 - 74%    Lower Body Dressing/Undressing Lower body dressing      What is the patient wearing?: Incontinence brief     Lower body assist Assist for lower body dressing: Total Assistance - Patient < 25%     Toileting Toileting    Toileting assist Assist for toileting: Minimal Assistance - Patient > 75%     Transfers Chair/bed transfer  Transfers assist     Chair/bed transfer assist level: Minimal Assistance - Patient > 75%     Locomotion Ambulation   Ambulation assist      Assist level: Minimal Assistance - Patient > 75% Assistive device: Walker-rolling Max distance: 15   Walk 10 feet activity   Assist     Assist level: Minimal Assistance - Patient > 75% Assistive device: Walker-rolling   Walk 50 feet activity   Assist Walk 50 feet with 2 turns activity did not occur: Safety/medical concerns         Walk 150 feet activity   Assist Walk 150 feet activity did not occur: Safety/medical concerns         Walk 10 feet on uneven surface  activity   Assist Walk 10 feet on uneven surfaces activity did not occur: Safety/medical concerns         Wheelchair     Assist Is the patient using a wheelchair?: Yes Type of  Wheelchair: Manual    Wheelchair assist level: Dependent - Patient 0% Max wheelchair distance: 150    Wheelchair 50 feet with 2 turns activity    Assist        Assist Level: Dependent - Patient 0%   Wheelchair 150 feet activity     Assist      Assist Level: Dependent - Patient 0%   Blood pressure 126/83, pulse (!) 101, temperature 98.2 F (36.8 C), resp. rate 16, height '5\' 9"'$  (1.753 m), weight 75.1 kg, SpO2 99 %.   Medical Problem List and Plan: 1. Functional deficits secondary to debility secondary to pelvic abscess.  Status post laparoscopy, abdominal washout drain placement with creation of diverting colostomy 03/20/2022             -patient may shower but incisions/drains should be covered             -  ELOS/Goals: 10-14 days modI             -Admit to CIR 2.  Antithrombotics: -DVT/anticoagulation:  Pharmaceutical: Lovenox.  Venous Doppler negative             -antiplatelet therapy: N/A      5/27 Continue Lovenox 3. Pain Management: Robaxin 1000 mg 3 times daily, tramadol as needed      5/27 Pt not reporting issues with pain today. 4. Mood: Ativan as needed as well as trazodone             -antipsychotic agents: N/A 5. Neuropsych: This patient is capable of making decisions on his own behalf. 6. Skin/Wound Care: Routine skin checks with colostomy education 7. Fluids/Electrolytes/Nutrition: Routine in and outs with follow-up chemistries 5/27 Slightly low sodium (133) and potassium (3.4), continue to trend, consider repleting  BMET    Component Value Date/Time   NA 133 (L) 04/10/2022 0250   K 3.4 (L) 04/10/2022 0250   CL 100 04/10/2022 0250   CO2 24 04/10/2022 0250   GLUCOSE 101 (H) 04/10/2022 0250   BUN 12 04/10/2022 0250   CREATININE 0.92 04/10/2022 0250   CALCIUM 8.3 (L) 04/10/2022 0250   GFRNONAA >60 04/10/2022 0250     8.  ID/intra-abdominal infection.  Continue micafungin 100 mg daily as well as Zosyn 3.375 g every 8 hours.  Plan is for 6 weeks  starting date 5/16 ending 05/11/2022 and will need repeat CT abdomen/pelvis on left thigh in 1 to 2 weeks -5/27 Pt has event last night on 5/26 of temp 101.6, HR 115, new onset cough, with shortness of breath.  Per night nurse on 5/26-5/27 became diaphoretic around 0000 on 5/27 and drenched the bed with sweat, with no subsequent fevers or episodes of tachycardia. Orders were placed for CBC, BMET, blood cultures x2, CXR, COVID, respiratory panel, and repeat CT abd/pelvis. ID was consulted the evening of 5/26 with recommendation given to continue current regimen of 6 week Zosyn and micafungin based on original plan.  Per [place], CT abdomen/pelvis indicated with febrile event, especially in light that patient's last CT scan was 04/05/22 and he inadvertently lost one of his JP drains on the morning of 04/06/22. -Respiratory panel and COVID negative.  CXR shows Small left pleural effusion with atelectasis at the left lung base. -CT abd/pelvis: demonstrates a complex left retroperitoneal fluid collection, with superior and inferior components that appear to communicate overlying the Gerota's fascia and left psoas musculature. ID to round on pt on 04/11/22 and will decide next steps. 9.  Postoperative anemia.  Continue iron supplement.  Follow-up CBC 5/27 Hgb 8.2 low but stable, continue to trend and monitor.     Latest Ref Rng & Units 04/10/2022    2:50 AM 04/09/2022    3:45 PM 04/09/2022    2:43 AM  CBC  WBC 4.0 - 10.5 K/uL 5.5   4.3   5.3    Hemoglobin 13.0 - 17.0 g/dL 8.2   8.1   8.0    Hematocrit 39.0 - 52.0 % 23.1   24.1   23.9    Platelets 150 - 400 K/uL 301   296   311      10.  Hypertension.  Continue Zebeta 10 mg nightly,   Monitor with increased mobility 5/27 BP appears well controlled, continue Zebeta 10 mg nightly.    04/10/2022    7:35 PM 04/10/2022    5:42 PM 04/10/2022    1:55 PM  Vitals with BMI  Systolic 579 038 333  Diastolic 85 83 80  Pulse 832 101 94    11.  Decreased  nutritional storage.  Dietary follow-up. Continue Prosource.  12.  Urinary retention.  Foley tube removed and urinating.  Check PVR 5/27 Pt is voiding okay, reported that he had some urinary incontinence for the first day or two immediately following Foley removal, which has since resolved.    LOS: 1 days A FACE TO FACE EVALUATION WAS PERFORMED  Luetta Nutting 04/10/2022, 5:45 PM

## 2022-04-10 NOTE — Progress Notes (Signed)
Physical Therapy Session Note  Patient Details  Name: Tyler [name] MRN: [id number] Date of Birth: October 31, 1951  Today's Date: 04/10/2022 PT Individual Time: 1650-1730 PT Individual Time Calculation (min): 40 min   Short Term Goals: Week 1:  PT Short Term Goal 1 (Week 1): Pt will transfer to bed with CGA PT Short Term Goal 2 (Week 1): Pt will ambulate 70f with CGA PT Short Term Goal 3 (Week 1): Pt will propell WC 1088fwith supervision assist PT Short Term Goal 4 (Week 1): Pt will ascend 2 steps with min assist and BUE Support Week 2:     Skilled Therapeutic Interventions/Progress Updates:   Pt received supine in bed and agreeable to PT, but NT present assisting with CHG bath. PT returned in 20 minutes and pt able to participate. Supine>sit transfer with supeivsoin assist and heacy use of bed rails. Stand pivot tranfers performed x 3 throughout session with CGA and RW with cues for UE placement for safety. PT attempted orthostatic VS assessment. Sitting. 130/83, HR 89. Standing for 15 sec 120/78, HR 91. Pt reporting need for urination and unable to sustain for entire BP check. Stand pivot transfer to EOb with RW as listed. Pt able to perform urination sitting EOB with min assist for clothing management. Pt performed 5 time sit<>stand (5xSTS): 46 sec (>15 sec indicates increased fall risk) Patient left sitting in WC with call bell in reach and all needs met.         Therapy Documentation Precautions:  Precautions Precautions: Fall Precaution Comments: drains, colostomy, rectal drainage Restrictions Weight Bearing Restrictions: No General: PT Amount of Missed Time (min): 20 Minutes PT Missed Treatment Reason: Nursing care Vital Signs: Therapy Vitals Temp: (!) 100.9 F (38.3 C) Temp Source: Oral Pulse Rate: 94 Resp: 18 BP: 135/80 Patient Position (if appropriate): Lying Oxygen Therapy SpO2: 100 % O2 Device: Room Air Pain: Denies at rest.    Therapy/Group: Individual  Therapy  AuLorie Phenix/27/2023, 5:40 PM

## 2022-04-10 NOTE — Progress Notes (Signed)
Subjective:  He feels better today than he did last night.   Antibiotics:  Anti-infectives (From admission, onward)    Start     Dose/Rate Route Frequency Ordered Stop   04/10/22 1000  vancomycin (VANCOCIN) IVPB 1000 mg/200 mL premix  Status:  Discontinued        1,000 mg 200 mL/hr over 60 Minutes Intravenous Every 12 hours 04/09/22 2211 04/09/22 2228   04/09/22 2300  vancomycin (VANCOREADY) IVPB 1500 mg/300 mL  Status:  Discontinued        1,500 mg 150 mL/hr over 120 Minutes Intravenous  Once 04/09/22 2211 04/09/22 2228   04/09/22 1700  piperacillin-tazobactam (ZOSYN) IVPB 3.375 g        3.375 g 12.5 mL/hr over 240 Minutes Intravenous Every 8 hours 04/09/22 1340 05/11/22 2359   04/09/22 1430  micafungin (MYCAMINE) 100 mg in sodium chloride 0.9 % 100 mL IVPB        100 mg 105 mL/hr over 1 Hours Intravenous Every 24 hours 04/09/22 1340 05/11/22 2359       Medications: Scheduled Meds:  (feeding supplement) PROSource Plus  30 mL Oral BID BM   vitamin C  500 mg Oral BID   bisoprolol  10 mg Oral QHS   Chlorhexidine Gluconate Cloth  6 each Topical Daily   enoxaparin (LOVENOX) injection  40 mg Subcutaneous Q24H   famotidine  20 mg Oral BID   feeding supplement  237 mL Oral TID BM   ferrous sulfate  325 mg Oral BID WC   lip balm  1 application. Topical BID   loperamide  2 mg Oral TID with meals   methocarbamol  1,000 mg Oral TID   montelukast  10 mg Oral QHS   multivitamin with minerals  1 tablet Oral Daily   pantoprazole  40 mg Oral Daily   polycarbophil  625 mg Oral BID   Continuous Infusions:  micafungin (MYCAMINE) IV 100 mg (04/10/22 1342)   ondansetron (ZOFRAN) IV     piperacillin-tazobactam (ZOSYN)  IV 3.375 g (04/10/22 0941)   PRN Meds:.acetaminophen, albuterol, alum & mag hydroxide-simeth, azelastine, ibuprofen, LORazepam, ondansetron (ZOFRAN) IV **OR** ondansetron (ZOFRAN) IV, ondansetron, simethicone, traMADol, traZODone    Objective: Weight  change:   Intake/Output Summary (Last 24 hours) at 04/10/2022 1544 Last data filed at 04/10/2022 1256 Gross per 24 hour  Intake 237.18 ml  Output 2420 ml  Net -2182.82 ml   Blood pressure 135/80, pulse 94, temperature (!) 100.9 F (38.3 C), temperature source Oral, resp. rate 18, height '5\' 9"'$  (1.753 m), weight 75.1 kg, SpO2 100 %. Temp:  [97.6 F (36.4 C)-101.6 F (38.7 C)] 100.9 F (38.3 C) (05/27 1501) Pulse Rate:  [80-114] 94 (05/27 1355) Resp:  [15-18] 18 (05/27 1355) BP: (118-138)/(74-86) 135/80 (05/27 1355) SpO2:  [97 %-100 %] 100 % (05/27 1355)  Physical Exam: Physical Exam HENT:     Head: Normocephalic and atraumatic.  Eyes:     General:        Right eye: No discharge.        Left eye: No discharge.     Extraocular Movements: Extraocular movements intact.     Conjunctiva/sclera: Conjunctivae normal.  Cardiovascular:     Rate and Rhythm: Normal rate and regular rhythm.  Pulmonary:     Effort: Pulmonary effort is normal. No respiratory distress.     Breath sounds: No wheezing.  Abdominal:     General: There is distension.  Skin:  General: Skin is warm.     Coloration: Skin is pale.  Neurological:     General: No focal deficit present.     Mental Status: He is alert and oriented to person, place, and time.  Psychiatric:        Mood and Affect: Mood normal.        Behavior: Behavior normal.        Thought Content: Thought content normal.        Judgment: Judgment normal.    Drains with purulent material present   CBC:    BMET Recent Labs    04/09/22 1925 04/10/22 0250  NA  --  133*  K  --  3.4*  CL  --  100  CO2  --  24  GLUCOSE  --  101*  BUN  --  12  CREATININE 1.05 0.92  CALCIUM  --  8.3*     Liver Panel  Recent Labs    04/08/22 0502  PROT 6.6  ALBUMIN 2.0*  AST 26  ALT 33  ALKPHOS 357*  BILITOT 0.8  BILIDIR 0.2  IBILI 0.6       Sedimentation Rate No results for input(s): ESRSEDRATE in the last 72 hours. C-Reactive  Protein No results for input(s): CRP in the last 72 hours.  Micro Results: Recent Results (from the past 720 hour(s))  Surgical PCR screen     Status: None   Collection Time: 03/15/22 12:00 PM   Specimen: Nasal Mucosa; Nasal Swab  Result Value Ref Range Status   MRSA, PCR NEGATIVE NEGATIVE Final   Staphylococcus aureus NEGATIVE NEGATIVE Final    Comment: (NOTE) The Xpert SA Assay (FDA approved for NASAL specimens in patients 67 years of age and older), is one component of a comprehensive surveillance program. It is not intended to diagnose infection nor to guide or monitor treatment. Performed at [facility], [place] [address], [place], [place] [address]   MRSA Next Gen by PCR, Nasal     Status: None   Collection Time: 03/17/22  3:46 PM   Specimen: Nasal Mucosa; Nasal Swab  Result Value Ref Range Status   MRSA by PCR Next Gen NOT DETECTED NOT DETECTED Final    Comment: (NOTE) The GeneXpert MRSA Assay (FDA approved for NASAL specimens only), is one component of a comprehensive MRSA colonization surveillance program. It is not intended to diagnose MRSA infection nor to guide or monitor treatment for MRSA infections. Test performance is not FDA approved in patients less than 65 years old. Performed at [facility], [place] [address] [place], [place] [address]   Culture, blood (Routine X 2) w Reflex to ID Panel     Status: None   Collection Time: 03/17/22  4:11 PM   Specimen: BLOOD  Result Value Ref Range Status   Specimen Description   Final    BLOOD BLOOD LEFT ARM Performed at [facility] [address] [place], [place] [address]    Special Requests   Final    BOTTLES DRAWN AEROBIC ONLY Blood Culture results may not be optimal due to an inadequate volume of blood received in culture bottles Performed at [facility] [address], [place], [place] [address]    Culture   Final    NO GROWTH 5  DAYS Performed at [facility], [place] [address], [place], [place] [address]    Report Status 03/22/2022 FINAL  Final  Culture, blood (Routine X  2) w Reflex to ID Panel     Status: None   Collection Time: 03/17/22  4:11 PM   Specimen: BLOOD  Result Value Ref Range Status   Specimen Description   Final    BLOOD BLOOD LEFT HAND Performed at [facility] [address] [place], [place] [address]    Special Requests   Final    BOTTLES DRAWN AEROBIC ONLY Blood Culture adequate volume Performed at [facility] [address], [place], [place] [address]    Culture   Final    NO GROWTH 5 DAYS Performed at [facility], [place] [address], [place], [place] [address]    Report Status 03/22/2022 FINAL  Final  Aerobic/Anaerobic Culture w Gram Stain (surgical/deep wound)     Status: None   Collection Time: 03/20/22  8:44 AM   Specimen: PATH Soft tissue  Result Value Ref Range Status   Specimen Description   Final    TISSUE PATH SOFT TISSUE Performed at [facility] [address] [place], [place] [address]    Special Requests   Final    TISSUE Performed at [facility], [place] [address] [place], [place] [address]    Gram Stain   Final    NO SQUAMOUS EPITHELIAL CELLS SEEN MODERATE WBC SEEN ABUNDANT GRAM POSITIVE COCCI Performed at [facility], [place] [address] [place], [place] [address]    Culture   Final    FEW ESCHERICHIA COLI FEW ENTEROCOCCUS FAECALIS MIXED ANAEROBIC FLORA PRESENT.  CALL LAB IF FURTHER IID REQUIRED.    Report Status 03/25/2022 FINAL  Final   Organism ID, Bacteria ESCHERICHIA COLI  Final   Organism ID, Bacteria ENTEROCOCCUS FAECALIS  Final      Susceptibility   Escherichia coli - MIC*    AMPICILLIN >=32 RESISTANT Resistant     CEFAZOLIN <=4 SENSITIVE Sensitive     CEFEPIME <=0.12 SENSITIVE Sensitive     CEFTAZIDIME <=1 SENSITIVE Sensitive     CEFTRIAXONE <=0.25  SENSITIVE Sensitive     CIPROFLOXACIN <=0.25 SENSITIVE Sensitive     GENTAMICIN <=1 SENSITIVE Sensitive     IMIPENEM <=0.25 SENSITIVE Sensitive     TRIMETH/SULFA <=20 SENSITIVE Sensitive     AMPICILLIN/SULBACTAM 16 INTERMEDIATE Intermediate     PIP/TAZO <=4 SENSITIVE Sensitive     * FEW ESCHERICHIA COLI   Enterococcus faecalis - MIC*    AMPICILLIN <=2 SENSITIVE Sensitive     VANCOMYCIN 1 SENSITIVE Sensitive     GENTAMICIN SYNERGY SENSITIVE Sensitive     * FEW ENTEROCOCCUS FAECALIS  Culture, blood (Routine X 2) w Reflex to ID Panel     Status: None   Collection Time: 03/25/22 10:30 AM   Specimen: BLOOD  Result Value Ref Range Status   Specimen Description   Final    BLOOD LEFT ANTECUBITAL Performed at [facility] [address] [place], [place] [address]    Special Requests   Final    IN PEDIATRIC BOTTLE Blood Culture adequate volume Performed at [facility] [address], [place], [place] [address]    Culture   Final    NO GROWTH 5 DAYS Performed at [facility], [place] [address], [place], [place] [address]    Report Status 03/30/2022 FINAL  Final  Culture, blood (Routine X 2) w Reflex to ID Panel     Status: None   Collection Time: 03/25/22 10:37 AM   Specimen:  BLOOD LEFT HAND  Result Value Ref Range Status   Specimen Description   Final    BLOOD LEFT HAND Performed at [facility] [address] [place], [place] [address]    Special Requests   Final    IN PEDIATRIC BOTTLE Blood Culture adequate volume Performed at [facility] [address], [place], [place] [address]    Culture   Final    NO GROWTH 5 DAYS Performed at [facility], [place] [address] [place], [place] [address]    Report Status 03/30/2022 FINAL  Final  Aerobic/Anaerobic Culture w Gram Stain (surgical/deep wound)     Status: None   Collection Time: 03/30/22  5:00 PM   Specimen: Abscess  Result Value Ref  Range Status   Specimen Description   Final    ABSCESS Performed at [facility] [address], [place], [place] [address]    Special Requests LEFT LEG  Final   Gram Stain   Final    ABUNDANT WBC PRESENT, PREDOMINANTLY PMN NO ORGANISMS SEEN    Culture   Final    RARE CANDIDA ALBICANS NO ANAEROBES ISOLATED Performed at [facility], [place] [address] [place], [place] [address]    Report Status 04/04/2022 FINAL  Final  Respiratory (~20 pathogens) panel by PCR     Status: None   Collection Time: 04/09/22 11:00 PM   Specimen: Nasopharyngeal Swab; Respiratory  Result Value Ref Range Status   Adenovirus NOT DETECTED NOT DETECTED Final   Coronavirus 229E NOT DETECTED NOT DETECTED Final    Comment: (NOTE) The Coronavirus on the Respiratory Panel, DOES NOT test for the novel  Coronavirus (2019 nCoV)    Coronavirus HKU1 NOT DETECTED NOT DETECTED Final   Coronavirus NL63 NOT DETECTED NOT DETECTED Final   Coronavirus OC43 NOT DETECTED NOT DETECTED Final   Metapneumovirus NOT DETECTED NOT DETECTED Final   Rhinovirus / Enterovirus NOT DETECTED NOT DETECTED Final   Influenza A NOT DETECTED NOT DETECTED Final   Influenza B NOT DETECTED NOT DETECTED Final   Parainfluenza Virus 1 NOT DETECTED NOT DETECTED Final   Parainfluenza Virus 2 NOT DETECTED NOT DETECTED Final   Parainfluenza Virus 3 NOT DETECTED NOT DETECTED Final   Parainfluenza Virus 4 NOT DETECTED NOT DETECTED Final   Respiratory Syncytial Virus NOT DETECTED NOT DETECTED Final   Bordetella pertussis NOT DETECTED NOT DETECTED Final   Bordetella Parapertussis NOT DETECTED NOT DETECTED Final   Chlamydophila pneumoniae NOT DETECTED NOT DETECTED Final   Mycoplasma pneumoniae NOT DETECTED NOT DETECTED Final    Comment: Performed at [facility], [place]. [address] [place],  [address]  SARS Coronavirus 2 by RT PCR (hospital order, performed in [facility] hospital lab) *cepheid single result test*      Status: None   Collection Time: 04/09/22 11:00 PM  Result Value Ref Range Status   SARS Coronavirus 2 by RT PCR NEGATIVE NEGATIVE Final    Comment: (NOTE) SARS-CoV-2 target nucleic acids are NOT DETECTED.  The SARS-CoV-2 RNA is generally detectable in upper and lower respiratory specimens during the acute phase of infection. The lowest concentration of SARS-CoV-2 viral copies this assay can detect is 250 copies / mL. A negative result does not preclude SARS-CoV-2 infection and should not be used as the sole basis for treatment or other patient management decisions.  A negative result may occur with improper specimen collection / handling, submission of specimen other than nasopharyngeal  swab, presence of viral mutation(s) within the areas targeted by this assay, and inadequate number of viral copies (<250 copies / mL). A negative result must be combined with clinical observations, patient history, and epidemiological information.  Fact Sheet for Patients:   https://www.patel.info/  Fact Sheet for Healthcare Providers: https://hall.com/  This test is not yet approved or  cleared by the [place] FDA and has been authorized for detection and/or diagnosis of SARS-CoV-2 by FDA under an Emergency Use Authorization (EUA).  This EUA will remain in effect (meaning this test can be used) for the duration of the COVID-19 declaration under Section 564(b)(1) of the Act, 21 U.S.C. section 360bbb-3(b)(1), unless the authorization is terminated or revoked sooner.  Performed at [facility], [place] [address], [place], [place] [address]   Culture, blood (Routine X 2) w Reflex to ID Panel     Status: None (Preliminary result)   Collection Time: 04/10/22  2:50 AM   Specimen: BLOOD LEFT FOREARM  Result Value Ref Range Status   Specimen Description BLOOD LEFT FOREARM  Final   Special Requests   Final    BOTTLES DRAWN AEROBIC AND ANAEROBIC Blood  Culture results may not be optimal due to an excessive volume of blood received in culture bottles   Culture   Final    NO GROWTH < 12 HOURS Performed at [facility], [place] [address], [place], [place] [address]    Report Status PENDING  Incomplete  Culture, blood (Routine X 2) w Reflex to ID Panel     Status: None (Preliminary result)   Collection Time: 04/10/22  2:50 AM   Specimen: BLOOD LEFT HAND  Result Value Ref Range Status   Specimen Description BLOOD LEFT HAND  Final   Special Requests   Final    BOTTLES DRAWN AEROBIC AND ANAEROBIC Blood Culture results may not be optimal due to an excessive volume of blood received in culture bottles   Culture   Final    NO GROWTH < 12 HOURS Performed at [facility], [place] [address] [place], [place] [address]    Report Status PENDING  Incomplete    Studies/Results: DG Chest 2 View  Result Date: 04/09/2022 CLINICAL DATA:  Shortness of breath. EXAM: CHEST - 2 VIEW COMPARISON:  03/28/2022. FINDINGS: Heart is enlarged and the mediastinal contour is stable. There is a small pleural effusion on the left with atelectasis. The right lung is clear. No pneumothorax. A right PICC line terminates over the superior vena cava. No acute osseous abnormality. IMPRESSION: Small left pleural effusion with atelectasis at the left lung base. Electronically Signed   By: Brett Fairy M.D.   On: 04/09/2022 22:33      Assessment/Plan:  INTERVAL HISTORY: Became febrile and diaphoretic last night   Principal Problem:   Debility    Tyler [name] is a 71 y.o. male who had undergone transanal hemorrhoidal dearterialization in April 21 with general surgery.  Unfortunately he then developed nausea abdominal pain and fevers.  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  Repeat CT in May 5 was performed due to ongoing sepsis physiology  and showed significant retroperitoneal contamination and was taken back to the operating room again on May 6 and had creation of a diverting colostomy and placement of drain x3 culture the OR grew E. coli Enterococcus faecalis  and anaerobes.  Follow-up CT on May 12 that showed edema inflammation in the soft tissue and gas in the pelvic floor in the presacral space with surgical drains in place he had 5.3 x 3.5 x 7.2 cm fluid collection left upper quadrant as well is intramuscular fluid and edema.  IR was consulted for possible drainage of left upper quadrant fluid collection however at the time was deemed too risky to pursue this.  He has been on Zosyn which is active against all the organisms that have been isolated so far.  Repeat CT of the abdomen pelvis on May 22nd that showed improvement in bilateral pleural effusions body wall edema.  It was a small area of fluid collection into the left kidney a possible early developing fluid collection in the left pelvic sidewall 5.2 x 2.2 cm  His surgical drains were in good position without any change in the appearance around them.  There were no new fluid collections near those drains.  There is similar appearance of multiple intramuscular abscesses in the proximal left thigh.  In the interval one of the drains came out.  Last night he became abruptly febrile and diaphoretic.  Blood cultures were obtained.  I recommended repeat CT of the abdomen pelvis which is going to be performed today.  I suspect he will have a new abscess that will need to be drained.  If abscess is drained would send again for culture so we can adjust his antibiotics accordingly.   .I spent 36 minutes with the patient including than 50% of the time in face to face counseling of the patient regarding his intra-abdominal abscesses, personally reviewing prior CT scans updated culture data along with review of medical records in preparation for the visit and during the visit  and in coordination of his care.      LOS: 1 day   Alcide Evener 04/10/2022, 3:44 PM

## 2022-04-10 NOTE — Evaluation (Signed)
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), bilateral    Chronic pruritic rash in adult 2004   hx shingles  w/ residual recurrent rash, take valtrex daily   ED (erectile dysfunction)    GERD (gastroesophageal reflux disease)    H/O pityriasis rosea    History of basal cell carcinoma (BCC) excision    2022 left calf s/p mohs   History of pertussis 2018   History of SCC (squamous cell carcinoma) of skin    removed from head   History of thrombocytopenia 12/03/2010   Hx of adenomatous polyp of colon 04/07/2017   Hypertension    Mild asthma    followed by pcp   OA (osteoarthritis)    knees, fingers   Prolapsed internal hemorrhoids, grade 3    hx  external hemorroid banding   Wears glasses    Past Surgical History:  Past Surgical History:  Procedure Laterality Date   COLONOSCOPY WITH PROPOFOL  04/01/2017   by dr Carlean Purl   DIRECT LARYNGOSCOPY  1986   left vocal fold bx   (non-cancerous granuloma)   FINGER SURGERY Left 2017   ligament and tendon repair left index finger   HEMORRHOID SURGERY N/A 03/15/2022   Procedure: ANAL EXAM UNDER ANESTHESIA WITH PROCTOSCOPY  AND DEBRIDEMENT;  Surgeon: Leighton Ruff, MD;  Location: [facility];  Service: General;  Laterality: N/A;   INGUINAL HERNIA REPAIR Right 05/25/2018   Procedure: RIGHT INGUINAL HERNIA REPAIR WITH MESH;  Surgeon: Jovita Kussmaul, MD;  Location: [place];  Service: General;  Laterality: Right;    INSERTION OF MESH Right 05/25/2018   Procedure: INSERTION OF MESH;  Surgeon: Jovita Kussmaul, MD;  Location: [place];  Service: General;  Laterality: Right;   KNEE ARTHROSCOPY W/ MENISCAL REPAIR Right 10/26/2005   _0   by Dr  Wynelle Link   LAPAROSCOPIC APPENDECTOMY  03/22/2011   _1    LAPAROSCOPY N/A 03/20/2022   Procedure: DIAGNOSTIC LAPAROSCOPY; ABDOMINAL [place] OUT AND DRAIN PLACEMENT; CREATION OF DIVERTING COLOSTOMY; ANAL EXAM UNDER ANESTHESIA;  Surgeon: Leighton Ruff, MD;  Location: [facility];  Service: General;  Laterality: N/A;   MOHS SURGERY  05/2021   left lower leg   TONSILLECTOMY  1958   TRANSANAL HEMORRHOIDAL DEARTERIALIZATION N/A 03/05/2022   Procedure: TRANSANAL HEMORRHOIDAL DEARTERIALIZATION;  Surgeon: Leighton Ruff, MD;  Location: [place];  Service: General;  Laterality: N/A;    Assessment & Plan Clinical Impression: Patient is a 71 year old right-handed male with history of anxiety, Thrombocytopeni, hypertension, quit smoking 33 years ago.  Patient with recent transanal hemorrhoidal dearterialization 4/21 outpatient procedure per Dr. Leighton Ruff.  Postoperative course complicated by increasing abdominal pain, nausea, and fever that persisted x2 days.  He presented to the [facility] emergency department 03/09/2022.  Chemistry showed sodium 132 potassium 3.4 BUN 27 creatinine 1.27, his white count was upper end of normal 10.1.  CT scan of the abdomen pelvis performed showing thickening of the rectal wall.  There was also gas and  fluid within the rectal wall and in the presacral space concerning for possible rectal perforation.  He was made n.p.o. and maintained on TPN for nutritional support with broad-spectrum antibiotics.  His white count increased to 20,000.  He underwent anal exam under anesthesia with debridement, incision and drainage of anal canal, and proctoscopy 03/15/2022.  Hospital course complicated by persistent fevers.  He was transferred to  progressive care and underwent diagnostic laparoscopy, abdominal washout and drain placement with creation of diverting colostomy 03/20/2022.  Follow-up CT abdomen pelvis 03/26/2022 showed extensive edema/inflammation and soft tissue gas in the pelvic floor and presacral space as well as a 5.3 x 3.5 x 7.2 cm collection of gas and fluid in the left upper quadrant anterior to the kidney and medial to the spleen.  Infectious disease, Dr. Juleen China, consulted in regards to suspect intra-abdominal infection and currently maintained on intravenous Zosyn for coverage as well as micafungin x6 weeks from 5/16 with repeat CT abdomen/pelvis and left thigh recommended in 1 to 2 weeks.  Latest CT abdomen pelvis 04/05/2022 showed improvement in small bilateral pleural effusions and bibasilar atelectasis as well as improvement in body wall edema.  Small air-fluid collection anterior to the left kidney with possibly developing small fluid collection in the left pelvic sidewall 5.2 x 2.2 cm.  No new fluid collections adjacent to the drains.  He was cleared to begin Lovenox for DVT prophylaxis.  Left lower quadrant JP drain inadvertently came out around 6 AM 04/06/2022 with fall per surgery advise just continue to monitor with dry dressing applied.  Hospital course anemia 8.0.  His white blood cell count has improved to 5300.  Bouts of urinary retention he did require a Foley tube planning voiding trial.  Diet has been advanced to regular and TPN discontinued.  Therapy evaluations completed due to patient's decreased functional mobility was admitted for a comprehensive rehab program.  Patient transferred to CIR on 04/09/2022 .   Patient currently requires min with mobility secondary to muscle weakness and muscle joint tightness, decreased cardiorespiratoy endurance, and decreased standing balance and decreased balance strategies.  Prior to hospitalization, patient was independent  with mobility and lived with Significant other in a House  home.  Home access is 3Stairs to enter.  Patient will benefit from skilled PT intervention to maximize safe functional mobility, minimize fall risk, and decrease caregiver burden for planned discharge home with 24 hour supervision.  Anticipate patient will benefit from follow up [place] at discharge.  PT - End of Session Activity Tolerance: Tolerates < 10 min activity with changes in vital signs Endurance Deficit: Yes PT Assessment Rehab Potential (ACUTE/IP ONLY): Good PT Barriers to Discharge: Inaccessible home environment;Decreased caregiver support;Home environment access/layout;IV antibiotics;Wound Care;Insurance for SNF coverage PT Patient demonstrates impairments in the following area(s): Balance;Edema;Endurance;Pain;Skin Integrity PT Transfers Functional Problem(s): Bed Mobility;Bed to Chair;Car;Furniture;Floor PT Locomotion Functional Problem(s): Ambulation;Wheelchair Mobility;Stairs PT Plan PT Intensity: Minimum of 1-2 x/day ,45 to 90 minutes PT Frequency: 5 out of 7 days PT Duration Estimated Length of Stay: 14-16 days PT Treatment/Interventions: Ambulation/gait training;Discharge planning;Functional mobility training;Psychosocial support;Visual/perceptual remediation/compensation;Therapeutic Activities;Balance/vestibular training;Disease management/prevention;Neuromuscular re-education;Skin care/wound management;Therapeutic Exercise;Wheelchair propulsion/positioning;Cognitive remediation/compensation;DME/adaptive equipment instruction;Pain management;Splinting/orthotics;UE/LE Strength taining/ROM;Community reintegration;Patient/family education;Stair training;UE/LE Coordination activities PT Transfers Anticipated Outcome(s): Supervision assist trasnfers PT Locomotion Anticipated Outcome(s): Supervsion assist ambulation with RW for house hold distances. PT Recommendation Recommendations for Other Services: Neuropsych consult;Therapeutic Recreation consult Therapeutic Recreation  Interventions: Stress management;Outing/community reintergration Follow Up Recommendations: Home health PT Patient destination: Home Equipment Recommended: Rolling walker with 5"  wheels;Wheelchair (measurements);Wheelchair cushion (measurements)   PT Evaluation Precautions/Restrictions Precautions Precautions: Fall Precaution Comments: drains, colostomy, rectal drainage Restrictions Weight Bearing Restrictions: No General   Vital SignsTherapy Vitals Temp: 98.4 F (36.9 C) Pulse Rate: 88 Resp: 16 BP: 138/74 Patient Position (if appropriate): Sitting Oxygen Therapy SpO2: 100 % O2 Device: Room Air Pain Pain Assessment Pain Scale: 0-10 Pain Score: 1  Pain Type: Acute pain Pain Location: Rectum Pain Orientation: Other (Comment) (centralized) Pain Descriptors / Indicators: Aching;Dull Pain Onset: Gradual Patients Stated Pain Goal: 0 Pain Intervention(s): Repositioned;Ambulation/increased activity;Emotional support;Rest Multiple Pain Sites: No Pain Interference Pain Interference Pain Effect on Sleep: 1. Rarely or not at all Pain Interference with Therapy Activities: 1. Rarely or not at all Pain Interference with Day-to-Day Activities: 1. 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 Leisure: Hobbies-yes  (Comment) Vision/Perception  Vision - History Ability to See in Adequate Light: 0 Adequate Vision - Assessment Additional Comments: wears glasses Perception Perception: Within Functional Limits Praxis Praxis: Intact  Cognition Overall Cognitive Status: Within Functional Limits for tasks assessed Arousal/Alertness: Awake/alert Orientation Level: Oriented X4 Attention: Focused Focused Attention: Appears intact Memory: Appears intact Awareness: Appears intact Problem Solving: Appears intact Behaviors: Other (comment) (pt reports he can become mildly anxious re his health and ability since hospitalized) Comments: fearfull of movement, but [facility] Sensation Sensation Light Touch: Appears Intact Hot/Cold: Appears Intact Proprioception: Appears Intact Stereognosis: Appears Intact Coordination Gross Motor Movements are Fluid and Coordinated: Yes Fine Motor Movements are Fluid and Coordinated: Yes Finger Nose Finger Test: mild tremor in BUE, but otherwise [facility] Heel Shin Test: slow with mild ROM deficits. no coordination deficits. Motor  Motor Motor: Other (comment) Motor - Skilled Clinical Observations: generalized deconditioning   Trunk/Postural Assessment  Cervical Assessment Cervical Assessment: Within Functional Limits Thoracic Assessment Thoracic Assessment: Within Functional Limits Lumbar Assessment Lumbar Assessment: Within Functional Limits Postural Control Postural Control: Deficits on evaluation Righting Reactions: diminished due to prolonged bed rest and overal decondidtioning  Balance Balance Balance Assessed: Yes Static Sitting Balance Static Sitting - Balance Support: Feet supported Static Sitting - Level of Assistance: 4: Min assist Dynamic Sitting Balance Dynamic Sitting - Balance Support: Feet unsupported Dynamic Sitting - Level of Assistance: 3: Mod assist Dynamic Sitting Balance - Compensations: posterior pelvis and posterior lean and mild LOB posteriorly  most likely due to prolonged bed rest Dynamic Sitting - Balance Activities: Reaching for objects Sitting balance - Comments: fatigues easily Static Standing Balance Static Standing - Balance Support: Bilateral upper extremity supported Static Standing - Level of Assistance: 3: Mod assist Static Standing - Comment/# of Minutes: up to 2 minutes max for LB bathing and incontinence brief changing Dynamic Standing Balance Dynamic Standing - Balance Support: Bilateral upper extremity supported Dynamic Standing - Level of Assistance: 3: Mod assist Dynamic Standing - Balance Activities: Reaching for objects Dynamic Standing - Comments: Fearful of movment and reach OBOS with RW for safety Extremity Assessment  RUE Assessment RUE Assessment: Exceptions to [facility] RUE Strength  RUE Overall Strength: Deficits RUE Overall Strength Comments: grossly 4-/5 LUE Assessment LUE Assessment: Exceptions to [facility] LUE Strength LUE Overall Strength: Deficits LUE Overall Strength Comments: grossly 4-/5 RLE Assessment RLE Assessment: Exceptions to [facility] RLE Strength Right Hip Flexion: 4-/5 Right Hip Extension: 4-/5 Right Hip ABduction: 4-/5 Right Hip ADduction: 4-/5 Right Knee Flexion: 4-/5 Right Knee Extension: 4/5 Right Ankle Dorsiflexion: 4/5 Right Ankle Plantar Flexion: 4-/5 LLE Assessment LLE Assessment: Exceptions to [facility] LLE Strength Left Hip Flexion: 4-/5 Left Hip Extension: 4-/5 Left Hip ABduction: 3+/5 Left Hip ADduction: 4-/5 Left Knee Flexion: 4-/5 Left Knee Extension: 4-/5 Left Ankle Dorsiflexion: 4-/5 Left Ankle Plantar Flexion: 4-/5  Care Tool Care Tool Bed Mobility Roll left and right activity   Roll left and right assist level: Supervision/Verbal cueing    Sit to lying activity   Sit to lying assist level: Supervision/Verbal cueing    Lying to sitting on side of bed activity   Lying to sitting on side of bed assist level: the ability to move from lying on the back to sitting on the  side of the bed with no back support.: Supervision/Verbal cueing     Care Tool Transfers Sit to stand transfer   Sit to stand assist level: Minimal Assistance - Patient > 75%    Chair/bed transfer   Chair/bed transfer assist level: Minimal Assistance - Patient > 75%     Toilet transfer Toilet transfer activity did not occur: Safety/medical concerns      Scientist, product/process development transfer activity did not occur: Refused        Care Tool Locomotion Ambulation   Assist level: Minimal Assistance - Patient > 75% Assistive device: Walker-rolling Max distance: 15  Walk 10 feet activity   Assist level: Minimal Assistance - Patient > 75% Assistive device: Walker-rolling   Walk 50 feet with 2 turns activity Walk 50 feet with 2 turns activity did not occur: Safety/medical concerns      Walk 150 feet activity Walk 150 feet activity did not occur: Safety/medical concerns      Walk 10 feet on uneven surfaces activity Walk 10 feet on uneven surfaces activity did not occur: Safety/medical concerns      Stairs Stair activity did not occur: Safety/medical concerns        Walk up/down 1 step activity Walk up/down 1 step or curb (drop down) activity did not occur: Safety/medical concerns      Walk up/down 4 steps activity Walk up/down 4 steps activity did not occur: Safety/medical concerns      Walk up/down 12 steps activity Walk up/down 12 steps activity did not occur: Safety/medical concerns      Pick up small objects from floor   Pick up small object from the floor assist level: Total Assistance - Patient < 25%    Wheelchair Is the patient using a wheelchair?: Yes Type of Wheelchair: Manual   Wheelchair assist level: Dependent - Patient 0% Max wheelchair distance: 150  Wheel 50 feet with 2 turns activity   Assist Level: Dependent - Patient 0%  Wheel 150 feet activity   Assist Level: Dependent - Patient 0%    Refer to Care Plan for Long Term Goals  SHORT TERM GOAL WEEK 1 PT  Short Term Goal 1 (Week 1): Pt will transfer to bed with CGA PT Short Term Goal 2 (Week 1): Pt will ambulate 14f with CGA PT Short Term Goal 3 (Week 1): Pt will propell WC 1063fwith supervision assist PT Short  Term Goal 4 (Week 1): Pt will ascend 2 steps with min assist and BUE Support  Recommendations for other services: Neuropsych and Therapeutic Recreation  Stress management and Outing/community reintegration  Skilled Therapeutic Intervention Mobility Bed Mobility Bed Mobility: Rolling Right;Rolling Left;Supine to Sit;Sit to Supine Rolling Right: Supervision/verbal cueing Rolling Left: Supervision/Verbal cueing Right Sidelying to Sit: Supervision/Verbal cueing Supine to Sit: Supervision/Verbal cueing Sit to Supine: Supervision/Verbal cueing Transfers Transfers: Sit to Stand;Stand Pivot Transfers Sit to Stand: Minimal Assistance - Patient > 75% Stand Pivot Transfers: Minimal Assistance - Patient > 75% Stand Pivot Transfer Details: Tactile cues for posture;Verbal cues for safe use of DME/AE;Verbal cues for technique Stand Pivot Transfer Details (indicate cue type and reason): pt tentative with all movement and attention to 2 drains Transfer (Assistive device): Rolling walker Locomotion  Gait Ambulation: Yes Gait Assistance: Minimal Assistance - Patient > 75% Gait Distance (Feet): 15 Feet Assistive device: Rolling walker Gait Gait: Yes Gait Pattern: Impaired Gait Pattern: Shuffle;Decreased stride length Stairs / Additional Locomotion Stairs: No Wheelchair Mobility Wheelchair Mobility: Yes Wheelchair Assistance: Dependent - Patient 0% Wheelchair Parts Management: Needs assistance Distance: 150  Pt received supine in bed and agreeable to PT. Supine>sit transfer with supervision assist and cues for use of bed rails as needed. Stand pivot transfer to [facility] with mina assist and RW. PT instructed patient in PT Evaluation and initiated treatment intervention; see above for results.  PT educated patient in [place], rehab potential, rehab goals, and discharge recommendations along with recommendation for follow-up rehabilitation services.  Gait training with RW x 15 ft as listed. Pt reports mild orthostatic s/s in standing. Unable to attain BP. Pt performed all WC mobility through hall due to pt fatigue. Pt declined car transfer training at this time due to fatigue.  Pt required multiple therapeutic rest break throughout session due ot fear of fall and fatigue Pt returned to room and performed stand pivot transfer to bed with RUE supported on bed rail and min assist for safety. Sit>supine completed with supervision assist, and left supine in bed with call bell in reach and all needs met.      Discharge Criteria: Patient will be discharged from PT if patient refuses treatment 3 consecutive times without medical reason, if treatment goals not met, if there is a change in medical status, if patient makes no progress towards goals or if patient is discharged from hospital.  The above assessment, treatment plan, treatment alternatives and goals were discussed and mutually agreed upon: by patient  Lorie Phenix 04/10/2022, 12:14 PM

## 2022-04-10 NOTE — Evaluation (Signed)
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), bilateral    Chronic pruritic rash in adult 2004   hx shingles  w/ residual recurrent rash, take valtrex daily   ED (erectile dysfunction)    GERD (gastroesophageal reflux disease)    H/O pityriasis rosea    History of basal cell carcinoma (BCC) excision    2022 left calf s/p mohs   History of pertussis 2018   History of SCC (squamous cell carcinoma) of skin    removed from head   History of thrombocytopenia 12/03/2010   Hx of adenomatous polyp of colon 04/07/2017   Hypertension    Mild asthma    followed by pcp   OA (osteoarthritis)    knees, fingers   Prolapsed internal hemorrhoids, grade 3    hx  external hemorroid banding   Wears glasses    Past Surgical History:  Past Surgical History:  Procedure Laterality Date   COLONOSCOPY WITH PROPOFOL  04/01/2017   by dr Carlean Purl   DIRECT LARYNGOSCOPY  1986   left vocal fold bx   (non-cancerous granuloma)   FINGER SURGERY Left 2017   ligament and tendon repair left index finger   HEMORRHOID SURGERY N/A 03/15/2022   Procedure: ANAL EXAM UNDER ANESTHESIA WITH PROCTOSCOPY  AND DEBRIDEMENT;  Surgeon: Leighton Ruff, MD;  Location: [facility];  Service: General;  Laterality: N/A;   INGUINAL HERNIA REPAIR Right 05/25/2018   Procedure: RIGHT INGUINAL HERNIA REPAIR WITH MESH;  Surgeon: Jovita Kussmaul, MD;   Location: [place];  Service: General;  Laterality: Right;   INSERTION OF MESH Right 05/25/2018   Procedure: INSERTION OF MESH;  Surgeon: Jovita Kussmaul, MD;  Location: [place];  Service: General;  Laterality: Right;   KNEE ARTHROSCOPY W/ MENISCAL REPAIR Right 10/26/2005   '@[facility]'   by Dr  Wynelle Link   LAPAROSCOPIC APPENDECTOMY  03/22/2011   '@[facility]'    LAPAROSCOPY N/A 03/20/2022   Procedure: DIAGNOSTIC LAPAROSCOPY; ABDOMINAL [place] OUT AND DRAIN PLACEMENT; CREATION OF DIVERTING COLOSTOMY; ANAL EXAM UNDER ANESTHESIA;  Surgeon: Leighton Ruff, MD;  Location: [facility];  Service: General;  Laterality: N/A;   MOHS SURGERY  05/2021   left lower leg   TONSILLECTOMY  1958   TRANSANAL HEMORRHOIDAL DEARTERIALIZATION N/A 03/05/2022   Procedure: TRANSANAL HEMORRHOIDAL DEARTERIALIZATION;  Surgeon: Leighton Ruff, MD;  Location: [place];  Service: General;  Laterality: N/A;    Assessment & Plan Clinical Impression: Patient is a 71 y.o. year old male admitted to the hospital with pelvic sepsis after routine hemorrhoid surgery.  He was noted to have necrosis of his anal canal with rectal perforation.  He was placed on IV antibiotics and made n.p.o.  PICC line was placed and he was started on TPN.  He was taken to the OR where his infection and abscess were drained 03/15/2022.  He continued to decline and CT was ordered.  This showed significant retroperitoneal infection.  Therefore  we decided to proceed to the OR for diverting ostomy on Mar 20, 2022.  Patient was noted to have extensive retroperitoneal and preperitoneal infection.  Drains were placed in the pelvis was washed out.  Patient subsequently then then began to improve, but he continued to have high-grade fevers.  Cultures grew out E. coli and Enterococcus bacillus.  CT scan on 5/14 showed a left retroperitoneal fluid collection and a left thigh fluid collection.  Patient continued to have fevers and ID was consulted.  They  recommended imaging of the left thigh and eventually IR aspiration was performed on May 16.  This grew a small amount of Candida.  They also recommended switching to Zosyn despite his penicillin allergy and high fevers when we tried it before.  Patient did have some fevers on the first day of starting Zosyn but these subsided afterwards.  He was placed on micafungin for the Candida due to his allergy to Diflucan.  His PICC line and TPN were removed due to the fact that he was tolerating a diet and we were concerned these may be the source of his fevers.  His infectious issues slowed and stopped after doing these things.  He continues to have drainage through his JP drains in the pelvis and in the preperitoneal space.  The JP drain in his left retroperitoneum was accidentally removed by the patient on 5/23. A follow-up CT scan was performed on 5/22.  This showed that the retroperitoneal fluid collection was improving.  There was a new left sided small pelvic collection in the left thigh fluid collection was stable.  Given that his white count was normal and he was improving clinically, it was decided to continue on the current IV antibiotic course.  He began physical therapy and during this time we were working on his enteral nutrition.  By 04/09/2022, the patient was felt to be in stable condition from an infection standpoint.  He was tolerating a diet and improving daily.  It was recommended to continue rehab and an inpatient setting. Patient transferred to CIR on 04/09/2022 .    Patient currently requires max with basic self-care skills and IADL secondary to decreased sitting balance, decreased standing balance, decreased postural control, decreased balance strategies, and overal deconditioning with limited functional activity tolerance and strength .  Prior to hospitalization, patient could complete all aspects of A/IADL's including driving, gardening and recently retired Forensic psychologist professor at [employer] with independent  .  Patient will benefit from skilled intervention to decrease level of assist with basic self-care skills, increase independence with basic self-care skills, and increase level of independence with iADL prior to discharge home with care partner.  Anticipate patient will require 24 hour supervision and follow up home health.  OT - End of Session Activity Tolerance: Tolerates 10 - 20 min activity with multiple rests Endurance Deficit: Yes Endurance Deficit Description: low activity tolerance, requires frequent rests OT Assessment Rehab Potential (ACUTE ONLY): Good OT Patient demonstrates impairments in the following area(s): Balance;Endurance;Pain;Skin Integrity OT Basic ADL's Functional Problem(s): Grooming;Bathing;Dressing;Toileting OT Advanced ADL's Functional Problem(s): Simple Meal Preparation;Light Housekeeping OT Transfers Functional Problem(s): Toilet;Tub/Shower OT Additional Impairment(s): Other (comment) (overall activity tolerance and UE strength) OT Plan OT Intensity: Minimum of 1-2 x/day, 45 to 90 minutes OT Frequency: 5 out of 7 days OT Duration/Estimated Length of Stay: 14-16 days OT Treatment/Interventions: Balance/vestibular training;Discharge planning;Pain management;Self Care/advanced ADL retraining;Therapeutic Activities;UE/LE Coordination activities;Disease mangement/prevention;Functional mobility training;Patient/family education;Skin care/wound managment;Therapeutic Exercise;Community reintegration;DME/adaptive equipment instruction;Psychosocial support;UE/LE Strength taining/ROM;Wheelchair propulsion/positioning OT Basic  Self-Care Anticipated Outcome(s): mod I OT Recommendation Recommendations for Other Services: Therapeutic Recreation consult Therapeutic Recreation Interventions: Outing/community reintergration;Stress management;Pet therapy Patient destination: Home Follow Up Recommendations: Home health OT Equipment Recommended: Rolling walker with 5"  wheels;Wheelchair (measurements);Wheelchair cushion (measurements) Equipment Details: pt reports he has a commode and shower chair   OT Evaluation Precautions/Restrictions  Precautions Precautions: Fall Precaution Comments: drains, colostomy, rectal drainage Restrictions Weight Bearing Restrictions: No General Chart Reviewed: Yes Vital Signs Therapy Vitals Temp: 98.4 F (36.9 C) Pulse Rate: 88 Resp: 16 BP: 138/74 Patient Position (if appropriate): Sitting Oxygen Therapy SpO2: 100 % O2 Device: Room Air Pain Pain Assessment Pain Scale: 0-10 Pain Score: 1  Pain Type: Acute pain Pain Location: Rectum Pain Orientation: Other (Comment) (centralized) Pain Descriptors / Indicators: Aching;Dull Pain Onset: Gradual Patients Stated Pain Goal: 0 Pain Intervention(s): Repositioned;Ambulation/increased activity;Emotional support;Rest Multiple Pain Sites: No Home Living/Prior Functioning Home Living Available Help at Discharge: Family, Available 24 hours/day Type of Home: House Home Access: Stairs to enter [employer] of Steps: 3 Entrance Stairs-Rails: Right, Left, Can reach both Home Layout: One level Bathroom Shower/Tub: Multimedia programmer: Standard Bathroom Accessibility: Yes Additional Comments: pt has commode and RW from mother who was ill  Lives With: Significant other IADL History Homemaking Responsibilities: Yes Meal Prep Responsibility: Primary Laundry Responsibility: Primary Cleaning Responsibility: Primary Bill Paying/Finance Responsibility: Primary Homemaking Comments: Pt and partner share equal responsibility prior but partner able to assist upon discharge Current License: Yes Mode of Transportation: Car Education: Grad school Occupation: Retired 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 Leisure and Hobbies: gardening IADL Comments: was indep Prior Function Level of Independence: Independent with basic ADLs,  Independent with homemaking with ambulation  Able to Take Stairs?: Yes Driving: Yes 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 Leisure: Hobbies-yes (Comment) Vision Baseline Vision/History: 1 Wears glasses Ability to See in Adequate Light: 0 Adequate Patient Visual Report: No change from baseline Vision Assessment?: No apparent visual deficits Additional Comments: wears glasses Perception  Perception: Within Functional Limits Praxis Praxis: Intact Cognition Cognition Overall Cognitive Status: Within Functional Limits for tasks assessed Arousal/Alertness: Awake/alert Orientation Level: Person;Place;Situation Person: Oriented Place: Oriented Situation: Oriented Memory: Appears intact Attention: Focused Focused Attention: Appears intact Awareness: Appears intact Problem Solving: Appears intact Behaviors: Other (comment) (pt reports he can become mildly anxious re his health and ability since hospitalized) Comments: fearfull of movement, but WFL Brief Interview for Mental Status (BIMS) Repetition of Three Words (First Attempt): 3 Temporal Orientation: Year: Correct Temporal Orientation: Month: Accurate within 5 days Temporal Orientation: Day: Correct Recall: "Sock": Yes, no cue required Recall: "Blue": Yes, no cue required Recall: "Bed": Yes, no cue required BIMS Summary Score: 15 Sensation Sensation Light Touch: Appears Intact Hot/Cold: Appears Intact Proprioception: Appears Intact Stereognosis: Appears Intact Coordination Gross Motor Movements are Fluid and Coordinated: Yes Fine Motor Movements are Fluid and Coordinated: Yes Finger Nose Finger Test: mild tremor in BUE, but otherwise [facility] Heel Shin Test: slow with mild ROM deficits. no coordination deficits. Motor  Motor Motor: Other (comment) Motor - Skilled Clinical Observations: generalized deconditioning  Trunk/Postural Assessment  Cervical Assessment Cervical Assessment:  Within Functional Limits Thoracic Assessment Thoracic Assessment: Within Functional Limits Lumbar Assessment Lumbar Assessment: Within Functional Limits Postural Control Postural Control: Deficits on evaluation Righting Reactions: diminished due to prolonged bed rest and overal decondidtioning  Balance Balance Balance Assessed: Yes Static Sitting Balance Static Sitting - Balance Support: Feet supported Static Sitting - Level of Assistance: 4: Min  assist Dynamic Sitting Balance Dynamic Sitting - Balance Support: Feet unsupported Dynamic Sitting - Level of Assistance: 3: Mod assist Dynamic Sitting Balance - Compensations: posterior pelvis and posterior lean and mild LOB posteriorly most likely due to prolonged bed rest Dynamic Sitting - Balance Activities: Reaching for objects Sitting balance - Comments: fatigues easily Static Standing Balance Static Standing - Balance Support: Bilateral upper extremity supported Static Standing - Level of Assistance: 3: Mod assist Static Standing - Comment/# of Minutes: up to 2 minutes max for LB bathing and incontinence brief changing Dynamic Standing Balance Dynamic Standing - Balance Support: Bilateral upper extremity supported Dynamic Standing - Level of Assistance: 3: Mod assist Dynamic Standing - Balance Activities: Reaching for objects Dynamic Standing - Comments: Fearful of movment and reach OBOS with RW for safety Extremity/Trunk Assessment RUE Assessment RUE Assessment: Exceptions to [facility] RUE Strength RUE Overall Strength: Deficits RUE Overall Strength Comments: grossly 4-/5 LUE Assessment LUE Assessment: Exceptions to [facility] LUE Strength LUE Overall Strength: Deficits LUE Overall Strength Comments: grossly 4-/5  Care Tool Care Tool Self Care Eating   Eating Assist Level: Set up assist    Oral Care    Oral Care Assist Level: Set up assist    Bathing   Body parts bathed by patient: Right arm;Left arm;Chest;Abdomen;Front perineal  area;Face Body parts bathed by helper: Right upper leg;Left upper leg;Right lower leg;Left lower leg   Assist Level: Maximal Assistance - Patient 24 - 49%    Upper Body Dressing(including orthotics)   What is the patient wearing?: Hospital gown only   Assist Level: Moderate Assistance - Patient 50 - 74%    Lower Body Dressing (excluding footwear)   What is the patient wearing?: Incontinence brief Assist for lower body dressing: Total Assistance - Patient < 25%    Putting on/Taking off footwear   What is the patient wearing?: Non-skid slipper socks Assist for footwear: Minimal Assistance - Patient > 75%       Care Tool Toileting Toileting activity   Assist for toileting: Minimal Assistance - Patient > 75%     Care Tool Bed Mobility Roll left and right activity   Roll left and right assist level: Supervision/Verbal cueing    Sit to lying activity   Sit to lying assist level: Supervision/Verbal cueing    Lying to sitting on side of bed activity   Lying to sitting on side of bed assist level: the ability to move from lying on the back to sitting on the side of the bed with no back support.: Supervision/Verbal cueing     Care Tool Transfers Sit to stand transfer   Sit to stand assist level: Minimal Assistance - Patient > 75%    Chair/bed transfer   Chair/bed transfer assist level: Minimal Assistance - Patient > 75%     Toilet transfer Toilet transfer activity did not occur: Safety/medical concerns       Care Tool Cognition  Expression of Ideas and Wants Expression of Ideas and Wants: 4. Without difficulty (complex and basic) - expresses complex messages without difficulty and with speech that is clear and easy to understand  Understanding Verbal and Non-Verbal Content Understanding Verbal and Non-Verbal Content: 4. Understands (complex and basic) - clear comprehension without cues or repetitions   Memory/Recall Ability Memory/Recall Ability : Current season;Location of  own room;Staff names and faces;That he or she is in a hospital/hospital unit;None of the above were recalled   Refer to Care Plan for [place]  GOAL WEEK 1 OT Short Term Goal 1 (Week 1): Pt will complete grooming sink side level standing up to 4 minute intervals with brief intermittent rests seated as needed with close S OT Short Term Goal 2 (Week 1): Pt will complete toilet and shower bench transfers with CGA using grab bars OT Short Term Goal 3 (Week 1): Pt will complete UB bathing and dressing with close S after set up OT Short Term Goal 4 (Week 1): Pt will complete LB bathing and dressing using AE as needed with min A OT Short Term Goal 5 (Week 1): Pt will verbally identify, express and employ 3/3 energy conservation strategies during ADL's, functional mobility and light UE ther ex//act with min VC's.  Recommendations for other services: Therapeutic Recreation  Pet therapy, Stress management, and Outing/community reintegration   Skilled Therapeutic Intervention ADL ADL Eating: Set up Where Assessed-Eating: Chair Grooming: Minimal assistance Where Assessed-Grooming: Edge of bed Upper Body Bathing: Minimal assistance Where Assessed-Upper Body Bathing: Edge of bed Lower Body Bathing: Maximal assistance Where Assessed-Lower Body Bathing: Edge of bed Upper Body Dressing: Moderate assistance Where Assessed-Upper Body Dressing: Edge of bed Lower Body Dressing: Maximal assistance Where Assessed-Lower Body Dressing: Edge of bed Toileting: Maximal assistance (urinal use with min a and Dep for colostomy management) Toilet Transfer: Not assessed (pt transferred bed t recliner via walker. Used urinal wiht OT and has colostomy for bowl mngt currently) Tub/Shower Transfer: Not assessed Social research officer, government: Not assessed (will assess next session due to pt fearing lightheadedness) ADL Comments: Pt feared episode of lightheadedness and drop in BP as per last therapy visit and  had been in hospital bed x 2 days. OT worked with pt bed level, EOB and then transferred via RW SPT taking 4 steps for ADL routine assessment, pt using urinal for voiding and has colostomy for bowel mngt, VSS throughout session, difficulty with LE reach and overall activity tolerance requiring frequenct rests and cues for pacing and breathing Mobility  Bed Mobility Bed Mobility: Rolling Right;Rolling Left;Supine to Sit;Sit to Supine Rolling Right: Supervision/verbal cueing Rolling Left: Supervision/Verbal cueing Right Sidelying to Sit: Supervision/Verbal cueing Supine to Sit: Supervision/Verbal cueing Sit to Supine: Supervision/Verbal cueing Transfers Sit to Stand: Minimal Assistance - Patient > 75%  Pt seen for initial Occupational therapy evaluation and training this am. Pt resting comfortably with HOB upright in bed and agreeable for all activity presented. OT clinician introduced role of OT in rehab and collaborated throughout session with patient on pertinent history, goals and plan of care as well as identifying needs for safe discharge to home when ready. Pt reports his partner and he share responsibilities and his partner is able to assist upon discharge. Pt has a commode and RW from when he assisted his mother in care prior to her passing. Pt reports his fever last night had made him somewhat anxious and exhausted but since then it has broken and he feels up for increasing his activity. Pt also reported he had been in bed for 2 days and last up he became dizzy and lightheaded and his BP dropped. OT assessed VS at rest, EOB and then in standing with all VSS (see flowsheets). Pt responded well to intermittent rests and cues for breathing. OT encouraged incentive spirometer and pt reports he has been using. OT assisted with bed mobility, sidelying to sit, EOB sitting balance and simple sponge bathing and dressing UB/LB and grooming. Pt has 2 bowel drain bulbs and rectal wound covered with dressing  as  well as colostomy for bowel management. Pt using urinal currently for voiding. Pt reports nursing still needed to change thus opted for brief, gown and slipper socks this session. Pt was then able to stand with RW and take 4 steps to recliner with ROHO cushion in place as per pt request due to rectal pain. Pt demonstrates significant overall weakness, deconditioning and decreased activity tolerance but otherwise performed well this session. See functional levels above. OT positioned pt with LE's elevated with AM meal, call bell, chair alarm and needs in place prior to leaving.    Discharge Criteria: Patient will be discharged from OT if patient refuses treatment 3 consecutive times without medical reason, if treatment goals not met, if there is a change in medical status, if patient makes no progress towards goals or if patient is discharged from hospital.  The above assessment, treatment plan, treatment alternatives and goals were discussed and mutually agreed upon: by patient  Barnabas Lister 04/10/2022, 12:54 PM

## 2022-04-10 NOTE — Progress Notes (Signed)
Initial Nutrition Assessment  DOCUMENTATION CODES:   Not applicable  INTERVENTION:   Ensure Enlive po TID, each supplement provides 350 kcal and 20 grams of protein.  30 ml ProSource Plus BID, each supplement provides 100 kcals and 15 grams protein.   MVI with Minerals daily, Vitamin C and ferrous sulfate per MD  Recommend supplementing potassium  If GI issues persist, recommend GI Soft diet  Follow-up and provide Colostomy Diet Education prior to discharge as able   NUTRITION DIAGNOSIS:   Inadequate oral intake related to altered GI function, acute illness, decreased appetite as evidenced by meal completion < 50%, per patient/family report.  GOAL:   Patient will meet greater than or equal to 90% of their needs  MONITOR:   PO intake, Supplement acceptance, Labs, Weight trends, Skin  REASON FOR ASSESSMENT:   Consult Assessment of nutrition requirement/status  ASSESSMENT:   71 yo male admitted on 5/26 to CIR after hospitalization from 4/25 until 5/56 for pelvic sepsis/abscess with rectal necrosis and perforation requiring colostomy for fecal diversion. PMH includes HTN, GERD, former tobacco use, hernia repair  Unable to reach pt via phone  Calorie count was performed during hospital stay and indicated inadequate oral intake . Pt did receive TPN from 4/28 until 5/18  Pt currently on Regular diet, ate 30% at dinner last night  Pt likes Ensure Enlive shakes but was not drinking consistently. RD to order and encourage intake. Pt was also taking some Pro-Source Plus; RD to reorder this for now as well  +abd pain, diarrhea, N/V. Pt with intra-abdominal infection requiring micafungin and zosyn for 6 weeks. Noted plan for CT abdomen pelvis today with plans for repeat 04/23/22  Colostomy with 250 mL out overnight  WOC team following for ostomy teaching/management. RD to follow and provide colostomy diet education as able  Current wt 75.1 kg; noted weight of 74.7 kg on 5/24.  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 Yes with Assist; Fluid consistency: Thin  Diet effective now                   EDUCATION NEEDS:   Not appropriate for education at this time  Skin:  Skin Integrity Issues:: DTI, Incisions, Other (Comment) DTI: bilateral heels Incisions: rectum and abdomen Other: new colostomy  Last BM:  5/26 via colostomy  Height:   Ht Readings from Last 1 Encounters:  04/09/22 '5\' 9"'$  (1.753 m)    Weight:   Wt Readings from Last 1 Encounters:  04/09/22 75.1 kg     BMI:  Body mass index is 24.45 kg/m.  Estimated Nutritional Needs:   Kcal:  2200-2400 kcls  Protein:  115-135 g  Fluid:  >/= 2L   Kerman Passey MS, RDN, LDN, CNSC Registered Dietitian  III Clinical Nutrition RD Pager and On-Call Pager Number Located in [place]

## 2022-04-10 NOTE — Plan of Care (Signed)
  Problem: RH Furniture Transfers Goal: LTG Patient will perform furniture transfers w/assist (OT/PT) Description: LTG: Patient will perform furniture transfers  with assistance (OT/PT). Flowsheets (Taken 04/10/2022 1251) LTG: Pt will perform furniture transfers with assist:: Independent with assistive device    Problem: RH Balance Goal: LTG: Patient will maintain dynamic sitting balance (OT) Description: LTG:  Patient will maintain dynamic sitting balance with assistance during activities of daily living (OT) Flowsheets (Taken 04/10/2022 1251) LTG: Pt will maintain dynamic sitting balance during ADLs with: Independent with assistive device   Problem: Sit to Stand Goal: LTG:  Patient will perform sit to stand in prep for activites of daily living with assistance level (OT) Description: LTG:  Patient will perform sit to stand in prep for activites of daily living with assistance level (OT) Flowsheets (Taken 04/10/2022 1251) LTG: PT will perform sit to stand in prep for activites of daily living with assistance level: Independent with assistive device   Problem: RH Grooming Goal: LTG Patient will perform grooming w/assist,cues/equip (OT) Description: LTG: Patient will perform grooming with assist, with/without cues using equipment (OT) Flowsheets (Taken 04/10/2022 1251) LTG: Pt will perform grooming with assistance level of: Independent with assistive device    Problem: RH Bathing Goal: LTG Patient will bathe all body parts with assist levels (OT) Description: LTG: Patient will bathe all body parts with assist levels (OT) Flowsheets (Taken 04/10/2022 1251) LTG: Pt will perform bathing with assistance level/cueing: Independent with assistive device    Problem: RH Dressing Goal: LTG Patient will perform upper body dressing (OT) Description: LTG Patient will perform upper body dressing with assist, with/without cues (OT). Flowsheets (Taken 04/10/2022 1251) LTG: Pt will perform upper body  dressing with assistance level of: Independent with assistive device Goal: LTG Patient will perform lower body dressing w/assist (OT) Description: LTG: Patient will perform lower body dressing with assist, with/without cues in positioning using equipment (OT) Flowsheets (Taken 04/10/2022 1251) LTG: Pt will perform lower body dressing with assistance level of: Independent with assistive device   Problem: RH Toileting Goal: LTG Patient will perform toileting task (3/3 steps) with assistance level (OT) Description: LTG: Patient will perform toileting task (3/3 steps) with assistance level (OT)  Flowsheets (Taken 04/10/2022 1251) LTG: Pt will perform toileting task (3/3 steps) with assistance level: Supervision/Verbal cueing   Problem: RH Simple Meal Prep Goal: LTG Patient will perform simple meal prep w/assist (OT) Description: LTG: Patient will perform simple meal prep with assistance, with/without cues (OT). Flowsheets (Taken 04/10/2022 1251) LTG: Pt will perform simple meal prep with assistance level of: Supervision/Verbal cueing LTG: Pt will perform simple meal prep w/level of: Ambulate with device   Problem: RH Light Housekeeping Goal: LTG Patient will perform light housekeeping w/assist (OT) Description: LTG: Patient will perform light housekeeping with assistance, with/without cues (OT). Flowsheets (Taken 04/10/2022 1251) LTG: Pt will perform light housekeeping with assistance level of: Supervision/Verbal cueing   Problem: RH Toilet Transfers Goal: LTG Patient will perform toilet transfers w/assist (OT) Description: LTG: Patient will perform toilet transfers with assist, with/without cues using equipment (OT) Flowsheets (Taken 04/10/2022 1251) LTG: Pt will perform toilet transfers with assistance level of: Independent with assistive device   Problem: RH Tub/Shower Transfers Goal: LTG Patient will perform tub/shower transfers w/assist (OT) Description: LTG: Patient will perform  tub/shower transfers with assist, with/without cues using equipment (OT) Flowsheets (Taken 04/10/2022 1251) LTG: Pt will perform tub/shower stall transfers with assistance level of: Independent with assistive device

## 2022-04-10 NOTE — Plan of Care (Signed)
  Problem: RH Balance Goal: LTG Patient will maintain dynamic sitting balance (PT) Description: LTG:  Patient will maintain dynamic sitting balance with assistance during mobility activities (PT) Flowsheets (Taken 04/10/2022 1217) LTG: Pt will maintain dynamic sitting balance during mobility activities with:: Independent with assistive device  Goal: LTG Patient will maintain dynamic standing balance (PT) Description: LTG:  Patient will maintain dynamic standing balance with assistance during mobility activities (PT) Flowsheets (Taken 04/10/2022 1217) LTG: Pt will maintain dynamic standing balance during mobility activities with:: Supervision/Verbal cueing   Problem: RH Bed Mobility Goal: LTG Patient will perform bed mobility with assist (PT) Description: LTG: Patient will perform bed mobility with assistance, with/without cues (PT). Flowsheets (Taken 04/10/2022 1217) LTG: Pt will perform bed mobility with assistance level of: Independent with assistive device    Problem: RH Bed to Chair Transfers Goal: LTG Patient will perform bed/chair transfers w/assist (PT) Description: LTG: Patient will perform bed to chair transfers with assistance (PT). Flowsheets (Taken 04/10/2022 1217) LTG: Pt will perform Bed to Chair Transfers with assistance level: Supervision/Verbal cueing   Problem: RH Car Transfers Goal: LTG Patient will perform car transfers with assist (PT) Description: LTG: Patient will perform car transfers with assistance (PT). Flowsheets (Taken 04/10/2022 1217) LTG: Pt will perform car transfers with assist:: Supervision/Verbal cueing   Problem: RH Furniture Transfers Goal: LTG Patient will perform furniture transfers w/assist (OT/PT) Description: LTG: Patient will perform furniture transfers  with assistance (OT/PT). Flowsheets (Taken 04/10/2022 1217) LTG: Pt will perform furniture transfers with assist:: Supervision/Verbal cueing   Problem: RH Ambulation Goal: LTG Patient will  ambulate in controlled environment (PT) Description: LTG: Patient will ambulate in a controlled environment, # of feet with assistance (PT). Flowsheets (Taken 04/10/2022 1217) LTG: Pt will ambulate in controlled environ  assist needed:: Independent with assistive device LTG: Ambulation distance in controlled environment: 169f with LRAD Goal: LTG Patient will ambulate in home environment (PT) Description: LTG: Patient will ambulate in home environment, # of feet with assistance (PT). Flowsheets (Taken 04/10/2022 1217) LTG: Pt will ambulate in home environ  assist needed:: Supervision/Verbal cueing LTG: Ambulation distance in home environment: [id number] LRAD   Problem: RH Wheelchair Mobility Goal: LTG Patient will propel w/c in controlled environment (PT) Description: LTG: Patient will propel wheelchair in controlled environment, # of feet with assist (PT) Flowsheets (Taken 04/10/2022 1217) LTG: Pt will propel w/c in controlled environ  assist needed:: Supervision/Verbal cueing LTG: Propel w/c distance in controlled environment: 150 Goal: LTG Patient will propel w/c in home environment (PT) Description: LTG: Patient will propel wheelchair in home environment, # of feet with assistance (PT). Flowsheets (Taken 04/10/2022 1217) LTG: Pt will propel w/c in home environ  assist needed:: Supervision/Verbal cueing Distance: wheelchair distance in controlled environment: 50   Problem: RH Stairs Goal: LTG Patient will ambulate up and down stairs w/assist (PT) Description: LTG: Patient will ambulate up and down # of stairs with assistance (PT) Flowsheets (Taken 04/10/2022 1217) LTG: Pt will ambulate up/down stairs assist needed:: Supervision/Verbal cueing LTG: Pt will  ambulate up and down number of stairs: 2 steps to acces home with BUE support

## 2022-04-11 DIAGNOSIS — A4151 Sepsis due to Escherichia coli [E. coli]: Secondary | ICD-10-CM

## 2022-04-11 NOTE — Consult Note (Signed)
Chief Complaint: Patient was seen in consultation today for intra-abdominal fluid collection.  Referring Physician(s): Tommy Medal, Roderic Scarce, MD  Supervising Physician: Juliet Rude  Patient Status: [facility] - In-pt  History of Present Illness: Tyler [name] is a 71 y.o. male with a past medical history significant for anxiety, depression, GERD, basil cell carcinoma (left calf), HTN, thrombocytopenia who was admitted to [facility] on 03/09/22 due to abdominal pain, nausea and fever following outpatient transanal hemorrhoidal dearterialization 2/33/00 with Dr. Leighton Ruff ([facility]). 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 CT 5/5 showed significant retroperitoneal contamination and he was taken back to the OR on 5/6 where he was found to have significant RP infection and underwent abdominal washout with 19 Fr Blake placement x 2 (left paracolic gutter, RLQ) and diverting colostomy creation. LLQ drain inadvertently removed 04/06/22. He has had a prolonged hospital course with persistent fevers and has been followed by ID. CT abd/eplvis w/contrast 04/10/22 showed:  1. There is a complex left retroperitoneal fluid collection, with superior and inferior components that appear to communicate overlying the Gerota's fascia and left psoas musculature. A previously seen surgical drain within the inferior aspect of this collection has been removed. The superior aspect of the collection is diminished in size, measuring 3.2 x 2.3 cm, previously 4.4 x 2.8 cm. Inferior aspect of the collection is similar, measuring 4.7 x 1.6 cm. 2. Additional presacral air and fluid collection contains one surgical drain, others removed and is similar in size, measuring 7.9 x 2.5 cm. 3. Left lower quadrant diverting colostomy. 4. Small left pleural effusion and associated atelectasis or consolidation, similar  to prior examination. 5. Coronary artery disease.   IR has been consulted for aspiration/possible drain placement within intra-abdominal fluid collections.  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), bilateral    Chronic pruritic rash in adult 2004   hx shingles  w/ residual recurrent rash, take valtrex daily   ED (erectile dysfunction)    GERD (gastroesophageal reflux disease)    H/O pityriasis rosea    History of basal cell carcinoma (BCC) excision    2022 left calf s/p mohs   History of pertussis 2018   History of SCC (squamous cell carcinoma) of skin    removed from head   History of thrombocytopenia 12/03/2010   Hx of adenomatous polyp of colon 04/07/2017   Hypertension    Mild asthma    followed by pcp   OA (osteoarthritis)    knees, fingers   Prolapsed internal hemorrhoids, grade 3    hx  external hemorroid banding   Wears glasses     Past Surgical History:  Procedure Laterality Date   COLONOSCOPY WITH PROPOFOL  04/01/2017   by dr Carlean Purl   DIRECT LARYNGOSCOPY  1986   left vocal fold bx   (non-cancerous granuloma)   FINGER SURGERY Left 2017   ligament and tendon repair left index finger   HEMORRHOID SURGERY N/A 03/15/2022   Procedure: ANAL EXAM UNDER ANESTHESIA WITH PROCTOSCOPY  AND DEBRIDEMENT;  Surgeon: Leighton Ruff, MD;  Location: [facility];  Service: General;  Laterality: N/A;   INGUINAL HERNIA REPAIR Right 05/25/2018   Procedure: RIGHT INGUINAL HERNIA REPAIR WITH MESH;  Surgeon: Jovita Kussmaul, MD;  Location: [facility];  Service: General;  Laterality: Right;  INSERTION OF MESH Right 05/25/2018   Procedure: INSERTION OF MESH;  Surgeon: Jovita Kussmaul, MD;  Location: [facility];  Service: General;  Laterality: Right;   KNEE ARTHROSCOPY W/ MENISCAL REPAIR Right 10/26/2005   '@[facility]'$   by Dr  Wynelle Link   LAPAROSCOPIC APPENDECTOMY   03/22/2011   '@[facility]'$    LAPAROSCOPY N/A 03/20/2022   Procedure: DIAGNOSTIC LAPAROSCOPY; ABDOMINAL [place] OUT AND DRAIN PLACEMENT; CREATION OF DIVERTING COLOSTOMY; ANAL EXAM UNDER ANESTHESIA;  Surgeon: Leighton Ruff, MD;  Location: [facility];  Service: General;  Laterality: N/A;   MOHS SURGERY  05/2021   left lower leg   TONSILLECTOMY  1958   TRANSANAL HEMORRHOIDAL DEARTERIALIZATION N/A 03/05/2022   Procedure: TRANSANAL HEMORRHOIDAL DEARTERIALIZATION;  Surgeon: Leighton Ruff, MD;  Location: [place];  Service: General;  Laterality: N/A;    Allergies: Fluconazole, Griseofulvin, Penicillins, Sulfa antibiotics, Oxycodone, Mometasone furo-formoterol fum, and Retapamulin  Medications: Prior to Admission medications   Medication Sig Start Date End Date Taking? Authorizing Provider  acetaminophen (TYLENOL) 500 MG tablet Take 2 tablets (1,000 mg total) by mouth every 6 (six) hours as needed for mild pain. 9/98/33   Leighton Ruff, MD  albuterol (VENTOLIN HFA) 108 (90 Base) MCG/ACT inhaler Inhale 2 puffs into the lungs every 6 (six) hours as needed for wheezing or shortness of breath.    [provider]  amLODipine (NORVASC) 5 MG tablet TAKE 1 TABLET BY MOUTH DAILY Patient taking differently: Take 5 mg by mouth in the morning. 04/29/14   Ricard Dillon, MD  ascorbic acid (VITAMIN C) 500 MG tablet Take 1 tablet (500 mg total) by mouth 2 (two) times daily. 07/09/04   Leighton Ruff, MD  azelastine (ASTELIN) 0.1 % nasal spray Place 1 spray into both nostrils 2 (two) times daily as needed for rhinitis. Use in each nostril as directed    [provider]  bisoprolol (ZEBETA) 10 MG tablet Take 10 mg by mouth at bedtime.    [provider]  diphenhydrAMINE (BENADRYL) 25 mg capsule Take 1 capsule (25 mg total) by mouth every 6 (six) hours as needed for itching, allergies or sleep (anxiety). 3/97/67   Leighton Ruff, MD  feeding supplement (ENSURE ENLIVE / ENSURE PLUS) LIQD Take  237 mLs by mouth 3 (three) times daily between meals. 3/41/93   Leighton Ruff, MD  ferrous sulfate 325 (65 FE) MG tablet Take 1 tablet (325 mg total) by mouth 2 (two) times daily with a meal. 7/90/24   Leighton Ruff, MD  ibuprofen (ADVIL) 400 MG tablet Take 1-2 tablets (400-800 mg total) by mouth every 8 (eight) hours as needed for fever, mild pain or cramping. 0/97/35   Leighton Ruff, MD  loperamide (IMODIUM) 2 MG capsule Take 1 capsule (2 mg total) by mouth with breakfast, with lunch, and with evening meal. 02/11/91   Leighton Ruff, MD  methocarbamol 1000 MG TABS Take 1,000 mg by mouth every 8 (eight) hours as needed for muscle spasms. 03/10/82   Leighton Ruff, MD  micafungin in sodium chloride 0.9 % 100 mL '100mg'$  IV q24h 03/03/61   Leighton Ruff, MD  montelukast (SINGULAIR) 10 MG tablet TAKE 1 TABLET BY MOUTH EVERY NIGHT AT BEDTIME Patient taking differently: Take 10 mg by mouth at bedtime. 09/10/13   Ricard Dillon, MD  Multiple Vitamin (MULTIVITAMIN WITH MINERALS) TABS tablet Take 1 tablet by mouth daily. 2/29/79   Leighton Ruff, MD  NEXIUM 89QJ 20 MG capsule Take 20 mg by mouth daily before breakfast.  [provider]  ondansetron (ZOFRAN-ODT) 4 MG disintegrating tablet Take 1 tablet (4 mg total) by mouth every 6 (six) hours as needed for nausea. 0/27/74   Leighton Ruff, MD  piperacillin-tazobactam (ZOSYN) 3.375 GM/50ML IVPB Inject 50 mLs (3.375 g total) into the vein every 8 (eight) hours. 12/12/76   Leighton Ruff, MD  polycarbophil (FIBERCON) 625 MG tablet Take 1 tablet (625 mg total) by mouth 2 (two) times daily. 6/76/72   Leighton Ruff, MD  sodium chloride flush (NS) 0.9 % SOLN 10-40 mLs by Intracatheter route every 12 (twelve) hours. 0/94/70   Leighton Ruff, MD  sodium chloride flush (NS) 0.9 % SOLN 10-40 mLs by Intracatheter route as needed (flush). 9/62/83   Leighton Ruff, MD  tadalafil (CIALIS) 10 MG tablet Take 10 mg by mouth daily as needed for erectile dysfunction.     [provider]  traMADol (ULTRAM) 50 MG tablet Take 1-2 tablets (50-100 mg total) by mouth every 6 (six) hours as needed for moderate pain or severe pain. 6/62/94   Leighton Ruff, MD  traZODone (DESYREL) 50 MG tablet Take 1 tablet (50 mg total) by mouth at bedtime as needed for sleep. 7/65/46   Leighton Ruff, MD  valACYclovir (VALTREX) 500 MG tablet Take 500 mg by mouth in the morning.    [provider]     Family History  Problem Relation Age of Onset   AAA (abdominal aortic aneurysm) Mother    Glaucoma Sister    Stomach cancer Paternal Grandfather    Colon cancer Neg Hx     Social History   Socioeconomic History   Marital status: Single    Spouse name: Not on file   Number of children: Not on file   Years of education: Not on file   Highest education level: Not on file  Occupational History   Occupation: Professor    Employer: [employer]  Tobacco Use   Smoking status: Former    Years: 20.00    Types: Cigarettes    Quit date: 1990    Years since quitting: 33.4   Smokeless tobacco: Never  Vaping Use   Vaping Use: Never used  Substance and Sexual Activity   Alcohol use: Yes    Alcohol/week: 28.0 standard drinks    Types: 28 Standard drinks or equivalent per week    Comment: 4 cocktails daily   Drug use: Never   Sexual activity: Yes    Partners: Male  Other Topics Concern   Not on file  Social History Narrative   Patient reports he is single 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  Male partners   Former smoker   No substance abuse   Alcohol 2/day   Social Determinants of Health   Financial Resource Strain: Not on file  Food Insecurity: Not on file  Transportation Needs: Not on file  Physical Activity: Not on file  Stress: Not on file  Social Connections: Not on file     Review of Systems: A 12 point ROS discussed and pertinent positives are indicated in the HPI above.  All other systems are  negative.  Review of Systems  Constitutional:  Positive for fever (intermittent). Negative for chills.  Respiratory:  Negative for cough and shortness of breath.   Cardiovascular:  Negative for chest pain.  Gastrointestinal:  Positive for abdominal pain. Negative for diarrhea, nausea and vomiting.  Musculoskeletal:  Negative for back pain.  Neurological:  Negative for dizziness and  headaches.   Vital Signs: BP 129/86 (BP Location: Left Arm)   Pulse 85   Temp 98.2 F (36.8 C) (Oral)   Resp 18   Ht '5\' 9"'$  (1.753 m)   Wt 165 lb 9.1 oz (75.1 kg)   SpO2 100%   BMI 24.45 kg/m   Physical Exam Vitals and nursing note reviewed.  Constitutional:      Comments: Anxious  HENT:     Head: Normocephalic and atraumatic.  Cardiovascular:     Rate and Rhythm: Normal rate and regular rhythm.  Pulmonary:     Effort: Pulmonary effort is normal.     Breath sounds: Normal breath sounds.  Abdominal:     Palpations: Abdomen is soft.     Tenderness: There is abdominal tenderness.     Comments: 2 right flank surgical drains Midline incision R epigastric colostomy TTP left side  Skin:    General: Skin is warm and dry.  Neurological:     Mental Status: He is alert and oriented to person, place, and time.  Psychiatric:        Mood and Affect: Mood normal.        Behavior: Behavior normal.        Thought Content: Thought content normal.        Judgment: Judgment normal.     MD Evaluation Airway: WNL Heart: WNL Abdomen: Other (comments) Abdomen comments: Midline incision, right epigastric colostomy, right flank surgical drains x 2 Chest/ Lungs: WNL ASA  Classification: 3 Mallampati/Airway Score: Two   Imaging: DG Chest 2 View  Result Date: 04/09/2022 CLINICAL DATA:  Shortness of breath. EXAM: CHEST - 2 VIEW COMPARISON:  03/28/2022. FINDINGS: Heart is enlarged and the mediastinal contour is stable. There is a small pleural effusion on the left with atelectasis. The right lung is clear.  No pneumothorax. A right PICC line terminates over the superior vena cava. No acute osseous abnormality. IMPRESSION: Small left pleural effusion with atelectasis at the left lung base. Electronically Signed   By: Brett Fairy M.D.   On: 04/09/2022 22:33   CT PELVIS W CONTRAST  Addendum Date: 03/19/2022   ADDENDUM REPORT: 03/19/2022 78:58 ADDENDUM: Dr. Leighton Ruff is operating in the OR, per her Nurse who contacted me by telephone at 0900 hours. I related the salient findings to her Nurse, and left my cell phone number for Dr. Marcello Moores to call me and discuss further as needed. Electronically Signed   By: Genevie Ann M.D.   On: 03/19/2022 09:20   Result Date: 03/19/2022 CLINICAL DATA:  71 year old male postoperative day 4 status post debridement and drainage of anal canal necrosis, right gluteal abscess, pelvic infection. Sepsis. EXAM: CT PELVIS WITH CONTRAST TECHNIQUE: Multidetector CT imaging of the pelvis was performed using the standard protocol following the bolus administration of intravenous contrast. RADIATION DOSE REDUCTION: This exam was performed according to the departmental dose-optimization program which includes automated exposure control, adjustment of the mA and/or kV according to patient size and/or use of iterative reconstruction technique. CONTRAST:  170m OMNIPAQUE IOHEXOL 300 MG/ML  SOLN COMPARISON:  CT Abdomen and Pelvis 03/14/2022 and earlier. FINDINGS: Urinary Tract: Visible kidneys appears stable. There is mild left hydroureter surrounded by inflammation and ongoing abnormal soft tissue gas. Urinary bladder is decompressed by Foley catheter. Abundant abnormal space of Retzius gas. Bowel: Ongoing abnormal rectum with appearance suspicious for focal perforation on series 4, image 61, abundant abnormal perirectal gas and inflammation. Abundant presacral gas and inflammation tracking cephalad into the  retroperitoneum of the lower abdomen greater on the left. Will issue anal soft tissue gas right  greater than left, tracking into the right inferior buttock on series 4, image 93. Associated organizing air in fluid collection situated along the left lateral conal fascia and pre renal fascia (series 4 images 1 through 14. This component of developing abscess is estimated at 40 mL volume. And additional bulky gas and fluid tracking along the left pelvic side wall appears somewhat contiguous (series 4, image 48) and increased since 03/14/2022. Throughout the lower abdomen small and large bowel loops are fluid-filled and mildly dilated. There is a small volume of free intraperitoneal air visible at the top of the scan in the abdominal cavity on the left series 4, image 1. Vascular/Lymphatic: Grossly patent visible major arterial structures with some iliofemoral atherosclerosis. No discrete lymphadenopathy. Reproductive: Gas and fluid tracking into the left inguinal canal has increased on series 4, image 63. Urethral catheter in place. Diffuse scrotal edema on series 4, image 116. No scrotal gas. Other: No layering free fluid in the lower abdomen or pelvis. Generalized subcutaneous edema at both flanks. Musculoskeletal: Degenerative changes in the visible lower spine. No acute osseous abnormality identified. But infectious appearing soft tissue gas and stranding now tracks distally along the left sciatic neurovascular bundle into the left thigh a distance of at least 12 cm (series 4, image 108 and series 602, image 76). IMPRESSION: 1. CT appearance compatible with Progressive Necrotizing Infection affecting multiple deep spaces of the lower abdomen and pelvis, and now tracking into the left lower extremity now along the sciatic neurovascular bundle. Organizing left retroperitoneal (pararenal space, lateral conal fascia) and left pelvic sidewall Abscesses (series 4, image 14). Abundant presacral and space of Retzius gas, and suspicion of ongoing dehiscence of the left lateral rectal wall (series 4, image 61). 2.  Diffuse scrotal edema.  No scrotal gas. 3. Mildly dilated small and large bowel loops throughout the lower abdomen and pelvis, suggesting ileus. Electronically Signed: By: Genevie Ann M.D. On: 03/19/2022 08:45   CT ABDOMEN PELVIS W CONTRAST  Result Date: 04/10/2022 CLINICAL DATA:  Intra-abdominal abscess EXAM: CT ABDOMEN AND PELVIS WITH CONTRAST TECHNIQUE: Multidetector CT imaging of the abdomen and pelvis was performed using the standard protocol following bolus administration of intravenous contrast. RADIATION DOSE REDUCTION: This exam was performed according to the departmental dose-optimization program which includes automated exposure control, adjustment of the mA and/or kV according to patient size and/or use of iterative reconstruction technique. CONTRAST:  138m OMNIPAQUE IOHEXOL 300 MG/ML  SOLN COMPARISON:  04/05/2021 FINDINGS: Lower chest: Small left pleural effusion and associated atelectasis or consolidation, similar to prior examination. Coronary artery disease. Hepatobiliary: No solid liver abnormality is seen. No gallstones, gallbladder wall thickening, or biliary dilatation. Pancreas: Unremarkable. No pancreatic ductal dilatation or surrounding inflammatory changes. Spleen: Normal in size without significant abnormality. Adrenals/Urinary Tract: Adrenal glands are unremarkable. Kidneys are normal, without renal calculi, solid lesion, or hydronephrosis. Bladder is unremarkable. Stomach/Bowel: Stomach is within normal limits. Appendix is surgically absent. Left lower quadrant diverting colostomy. Vascular/Lymphatic: Aortic atherosclerosis. No enlarged abdominal or pelvic lymph nodes. Reproductive: No mass or other significant abnormality. Other: Left lower quadrant colostomy. There is a complex left retroperitoneal fluid collection, with components that appear to communicate overlying the Gerota's fascia and left psoas musculature (series 3, image 39, 59). A previously seen surgical drain within the  inferior aspect of this collection has been removed. The superior aspect of the collection is diminished in size,  measuring 3.2 x 2.3 cm, previously 4.4 x 2.8 cm (series 3, image 37). Inferior aspect of the collection is similar, measuring 4.7 x 1.6 cm (series 3, image 59). Additional presacral air and fluid collection contains 1 surgical drain, others removed and is similar in size, measuring 7.9 x 2.5 cm (series 3, image 75). Anasarca. Musculoskeletal: No acute or significant osseous findings. IMPRESSION: 1. There is a complex left retroperitoneal fluid collection, with superior and inferior components that appear to communicate overlying the Gerota's fascia and left psoas musculature. A previously seen surgical drain within the inferior aspect of this collection has been removed. The superior aspect of the collection is diminished in size, measuring 3.2 x 2.3 cm, previously 4.4 x 2.8 cm. Inferior aspect of the collection is similar, measuring 4.7 x 1.6 cm. 2. Additional presacral air and fluid collection contains one surgical drain, others removed and is similar in size, measuring 7.9 x 2.5 cm. 3. Left lower quadrant diverting colostomy. 4. Small left pleural effusion and associated atelectasis or consolidation, similar to prior examination. 5. Coronary artery disease. Aortic Atherosclerosis (ICD10-I70.0). Electronically Signed   By: Delanna Ahmadi M.D.   On: 04/10/2022 16:11   CT ABDOMEN PELVIS W CONTRAST  Result Date: 04/05/2022 CLINICAL DATA:  Intra-abdominal abscess. Recent diverting colostomy. EXAM: CT ABDOMEN AND PELVIS WITH CONTRAST TECHNIQUE: Multidetector CT imaging of the abdomen and pelvis was performed using the standard protocol following bolus administration of intravenous contrast. RADIATION DOSE REDUCTION: This exam was performed according to the departmental dose-optimization program which includes automated exposure control, adjustment of the mA and/or kV according to patient size and/or use  of iterative reconstruction technique. CONTRAST:  141m OMNIPAQUE IOHEXOL 300 MG/ML  SOLN COMPARISON:  03/28/2022 and earlier FINDINGS: Lower chest: There is minimal atelectasis at the RIGHT lung base. Slight interval improvement in small RIGHT pleural effusion. There has been slight improvement in LEFT pleural effusion and LEFT basilar atelectasis/consolidation. Heart size is normal. Hepatobiliary: No focal liver abnormality is seen. No radiopaque gallstones, biliary dilatation, or pericholecystic inflammatory changes. Pancreas: Unremarkable. No pancreatic ductal dilatation or surrounding inflammatory changes. Spleen: Normal in size without focal abnormality. Adrenals/Urinary Tract: Normal appearance of the adrenal glands and kidneys. Urinary bladder is decompressed by a Foley catheter. Stomach/Bowel: Stomach and small bowel loops are normal in appearance. Diverting colostomy in the LEFT mid abdomen is again noted. There is mild stranding surrounding the colonic loops and mesentery in the LEFT mid abdomen, similar in appearance to prior study. No evidence for obstructing lesion. Vascular/Lymphatic: There is atherosclerotic calcification of the abdominal aorta. No retroperitoneal or mesenteric adenopathy. Reproductive: Prostate is unremarkable. Other: No interval change in the appearance of surgical drains. No new fluid collections surrounding any of the surgical drains. There is similar appearance of presacral locules of gas. Similar appearance bilateral pelvic wall gas. There is a possible developing small fluid collection along the LEFT pelvic sidewall measures 5.2 x 2.2 centimeters on image 75 of series 2. Air-fluid collection identified anterior to the LEFT kidney is 4.4 x 2.8 centimeters (image 35 of series 2). Previously, this collection measured 4.8 x 4.0 centimeters Significant improvement in body wall edema. Musculoskeletal: No acute or significant osseous findings. There are persistent low-attenuation  small fluid collections in the proximal LEFT thigh, similar in appearance to prior studies. Largest collection is in the LEFT pectineus, measuring 1.9 x 3.3 centimeters on image 84 of series 2. Smaller collections are seen in the LEFT obturator and proximal adductors. (Images 86 of series  2 and 94 series 2. The appearance is similar compared to prior studies. IMPRESSION: 1. Improvement in small bilateral pleural effusions and bibasilar atelectasis/consolidation. 2. Improvement in body wall edema. 3. Smaller air-fluid collection anterior to the LEFT kidney. 4. Similar postoperative and inflammatory changes in the LEFT mid abdomen. 5. Possible developing small fluid collection in the LEFT pelvic sidewall, 5.2 x 2.2 centimeters. 6. Unchanged appearance of multiple surgical drains. No new fluid collections adjacent to the drains. 7. Similar appearance of multiple probable intramuscular abscesses in the proximal LEFT thigh. Electronically Signed   By: Nolon Nations M.D.   On: 04/05/2022 16:59   CT ABDOMEN PELVIS W CONTRAST  Result Date: 03/28/2022 CLINICAL DATA:  Abdominal pain, recent diverting colostomy. EXAM: CT ABDOMEN AND PELVIS WITH CONTRAST TECHNIQUE: Multidetector CT imaging of the abdomen and pelvis was performed using the standard protocol following bolus administration of intravenous contrast. RADIATION DOSE REDUCTION: This exam was performed according to the departmental dose-optimization program which includes automated exposure control, adjustment of the mA and/or kV according to patient size and/or use of iterative reconstruction technique. CONTRAST:  123m OMNIPAQUE IOHEXOL 300 MG/ML  SOLN COMPARISON:  03/26/2022 FINDINGS: Lower chest: Small bilateral pleural effusions and dependent lower lobe atelectasis, left greater than right. Stable a cardiomegaly. Hepatobiliary: No focal liver abnormality is seen. No gallstones, gallbladder wall thickening, or biliary dilatation. Pancreas: Unremarkable. No  pancreatic ductal dilatation or surrounding inflammatory changes. Spleen: Normal in size without focal abnormality. Adrenals/Urinary Tract: Kidneys enhance normally and symmetrically. The adrenals are unremarkable. Bladder is decompressed with a Foley catheter. Stomach/Bowel: Left lower quadrant diverting colostomy is again identified, with mild distal colonic wall thickening just proximal to the colostomy site again noted. No evidence of bowel obstruction or ileus. Vascular/Lymphatic: Stable aortic atherosclerosis. Multiple subcentimeter lymph nodes in the mesentery are likely reactive. No pathologic adenopathy. Reproductive: Prostate is unremarkable. Other: Persistent inflammatory changes in the perirectal and presacral tissues, with soft tissue gas seen in the presacral space and extending along the bilateral pelvic sidewalls unchanged. There is a surgical drain in the presacral space via a right gluteal approach unchanged. There are 3 additional surgical drains. Most inferior drain is within the lower anterior abdominal wall within the rectus musculature, with no surrounding fluid collection. Two other drains entering the peritoneal space view of the right lower quadrant and left lower quadrant are coiled within the lower pelvis. The drains are unchanged in appearance. Inflammatory changes are again seen within the left lower quadrant mesentery. A stable gas and fluid collection anterior to the left kidney is again identified, measuring approximately 4.8 x 4.0 x 7.2 cm, slightly decreased in size since prior study. This is likely contiguous with the area drained by the left lower quadrant surgical drain. Musculoskeletal: No acute or destructive bony lesions. Intramuscular gas/fluid collections are seen within the medial and posterior proximal left thigh, concerning for intramuscular abscesses. Largest collection within the hamstring musculature proximal left thigh image 106/2 measures 5.1 x 3.1 cm. Reconstructed  images demonstrate no additional findings. IMPRESSION: 1. Persistent gas and fluid collection anterior to the left kidney, likely contiguous with the area drain by the left lower quadrant surgical drain. Slight interval decrease in size since prior study. 2. Extensive inflammatory changes within the presacral space and along the pelvic sidewall, with numerous surgical drains as above, without significant change since prior study. 3. Interval progression of intramuscular fluid collections within the medial and posterior musculature of the proximal left thigh, concerning for intramuscular abscesses. Largest  collection within the left hamstring musculature as above. 4. Left lower quadrant colostomy, with persistent wall thickening of the distal colon just proximal to the colostomy site. This may be secondary inflammatory change due to the intraperitoneal process, versus primary colitis. 5. Bilateral pleural effusions and dependent atelectasis, left greater than right, with slight progression since prior study. 6.  Aortic Atherosclerosis (ICD10-I70.0). Electronically Signed   By: Randa Ngo M.D.   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 RADIATION DOSE REDUCTION: This exam was performed according to the departmental dose-optimization program which includes automated exposure control, adjustment of the mA and/or kV according to patient size and/or use of iterative reconstruction technique. CONTRAST:  160m OMNIPAQUE IOHEXOL 300 MG/ML  SOLN COMPARISON:  Pelvis CT 03/19/2022 FINDINGS: Lower chest: There is some dependent collapse/consolidative  opacity in the lower lobes, left greater than right with small left and tiny right pleural effusions. Hepatobiliary: No suspicious focal abnormality within the liver parenchyma. There is no evidence for gallstones, gallbladder wall thickening, or pericholecystic fluid. No intrahepatic or extrahepatic biliary dilation. Pancreas: No focal mass lesion. No dilatation of the main duct. No intraparenchymal cyst. No peripancreatic edema. Spleen: No splenomegaly. No focal mass lesion. Adrenals/Urinary Tract: No adrenal nodule or mass. Kidneys unremarkable. No evidence for hydroureter. Bladder is decompressed by Foley catheter. Gas in the bladder lumen is compatible with the instrumentation. Stomach/Bowel: Stomach is unremarkable. No gastric wall thickening. No evidence of outlet obstruction. Duodenum is normally positioned as is the ligament of Treitz. No small bowel wall thickening. No small bowel dilatation. The terminal ileum is normal. The appendix is not well visualized, but there is no edema or inflammation in the region of the cecum. Right and transverse colon unremarkable. Left colon and sigmoid colon are decompressed with left abdominal sigmoid colostomy evident. The wall of the descending and sigmoid colon is irregular, likely secondary to the adjacent inflammatory changes (see below). Vascular/Lymphatic: There is mild atherosclerotic calcification of the abdominal aorta without aneurysm. Portal vein, superior mesenteric vein, and splenic vein are patent. Celiac axis and SMA are patent. There is no gastrohepatic or hepatoduodenal ligament lymphadenopathy. No retroperitoneal or mesenteric lymphadenopathy. No pelvic sidewall lymphadenopathy. Reproductive: The prostate gland and seminal vesicles are unremarkable. Other: There is extensive edema/inflammation and soft tissue gas in the pelvic floor presacral space. Surgical drain noted posterior to the rectum and appears to track laterally and anteriorly to the left,  potentially through the reported defect of the anal wall described in the surgical note (axial image 90/2 and coronal 64/4). Gas and fluid tracks up in the presacral space and a second surgical drain entering from the anterolateral right lower abdominal wall is position within the pre sacral abscess cavity. A second more inferiorly placed right lower quadrant surgical drain is positioned in the left rectus sheath. A third drain entering from the left lower quadrant is positioned in the left paracolic gutter. Gas and edema tracks in the extraperitoneal tissues from the presacral space cranially into the retroperitoneal abdomen extending up along the left anterior pararenal fascia and posterior peritoneum. 5.3 x 3.5 x 7.2 cm collection of gas and fluid is identified in the left upper quadrant, anterior  to the kidney and medial to the spleen. This tracks down to the peritoneal thickening along the left paracolic gutter but is unclear by CT whether this will be decompressed by the left paracolic gutter drain. Musculoskeletal: No worrisome lytic or sclerotic osseous abnormality. Extensive subcutaneous edema is identified in the lower abdominal and pelvic sidewall. Intermuscular fluid/edema is identified in both thighs, left greater than right with fluid tracking along the left adductor muscles and intermuscular fluid in the compartments of the anterior left thigh. Imaging was carried down to a position just caudal to the femoral neck to the level of the proximal femoral diaphyses. Several tiny gas bubbles are seen in the fluid associated with the anterior left thigh muscles (see image 93/2 and 92/2). IMPRESSION: 1. There is extensive edema/inflammation and soft tissue gas in the pelvic floor and presacral space. Surgical drain noted posterior to the rectum and appears to track laterally and anteriorly to the left, potentially through the reported defect of the anal wall described in the surgical note. The fluid and gas  tracks cranially in the retroperitoneal space of the abdomen and laterally and to the left along the anterior pararenal space and posterior peritoneum towards the paracolic gutter. 2. 5.3 x 3.5 x 7.2 cm collection of gas and fluid in the left upper quadrant, anterior to the kidney and medial to the spleen. This tracks down to the peritoneal thickening along the left paracolic gutter but is unclear by CT whether this will be decompressed by the left paracolic gutter drain. 3. Intermuscular fluid/edema is identified the region of both hips and proximal thigh bilaterally, left greater than right with fluid tracking along the left adductor muscles and intermuscular fluid in the compartments of the anterior left thigh. Several tiny gas bubbles are seen in the fluid associated with the anterior left thigh muscles. Imaging appearance raises concern for cellulitis/myositis. 4. Small left and tiny right pleural effusions with dependent collapse/consolidative opacity in the lower lobes, left greater than right. 5. Left abdominal sigmoid colostomy. Descending and sigmoid colon shows ill definition of the wall and mural edema, potentially secondary to the inflammatory process in the pelvis and left abdomen. 6. Aortic Atherosclerosis (ICD10-I70.0). Electronically Signed   By: Misty Stanley M.D.   On: 03/26/2022 10:27   CT ABDOMEN PELVIS W CONTRAST  Result Date: 03/14/2022 CLINICAL DATA:  Acute abdominal pain.  Pelvic pain in male. EXAM: CT ABDOMEN AND PELVIS WITH CONTRAST TECHNIQUE: Multidetector CT imaging of the abdomen and pelvis was performed using the standard protocol following bolus administration of intravenous contrast. RADIATION DOSE REDUCTION: This exam was performed according to the departmental dose-optimization program which includes automated exposure control, adjustment of the mA and/or kV according to patient size and/or use of iterative reconstruction technique. CONTRAST:  165m OMNIPAQUE IOHEXOL 300 MG/ML   SOLN COMPARISON:  CT 5 days ago 03/09/2022 FINDINGS: Lower chest: Central line tip in the lower SVC. Small bilateral pleural effusions and adjacent atelectasis. Hepatobiliary: Punctate hepatic granuloma. No focal liver lesion. Layering hyperdensity in the gallbladder not seen on prior likely representing vicarious excretion of contrast. No calcified gallstone. Pancreas: No ductal dilatation or inflammation. Spleen: Normal in size without focal abnormality. Adrenals/Urinary Tract: Normal adrenal glands. No hydronephrosis or perinephric edema. Homogeneous renal enhancement with symmetric excretion on delayed phase imaging. No renal calculi. Foley catheter decompresses the urinary bladder. Stomach/Bowel: Significant increase in the extraluminal gas from prior exam. There is patchy free air in the pelvis involving the extraperitoneal spaces, presacral space, left  anterior preperitoneal space and tracking adjacent to the sigmoid colon. Soft tissue gas tracks anterior to the urinary bladder. Air tracks into a left inguinal hernia. Patchy soft tissue gas tracks cranially in the retroperitoneum adjacent to the mesenteric vessels to the level of the iliac confluence. There is large amount of extraluminal gas involving the left pericolic gutter and anterior pararenal space. Free air tracks into the upper abdomen where it is seen anterior to the liver. Source of air likely represents rectal perforation or there is rectal wall thickening and edema. Air in the anterior rectal wall, series 2, image 102. There is generalized increase in stranding and ill-defined fluid associated with this extraluminal air. There is no peripherally enhancing fluid collection. Small hiatal hernia. Decompressed stomach. No small bowel obstruction or inflammation. High-riding cecum in the right mid abdomen. Appendix is not seen. Administered enteric contrast is seen to the level of the sigmoid colon. There is diverticulosis of the distal descending  and sigmoid colon but no focally inflamed diverticulum. Vascular/Lymphatic: Aortic atherosclerosis. No aneurysm. No evidence of mesenteric venous thrombus. The portal vein is patent. No bulky adenopathy. Reproductive: Unremarkable prostate. Other: Patchy increased an extraperitoneal gas as described. Left inguinal hernia contains air and small amount of non organized free fluid. There is edema within the subcutaneous soft tissues. Musculoskeletal: Scoliosis and vacuum phenomenon in the lumbar spine. There are no acute or suspicious osseous abnormalities. IMPRESSION: 1. Significant increase in extraluminal extraperitoneal gas in the abdomen and pelvis from CT 5 days ago as described above. Source of air likely represents rectal perforation. There is generalized increase in stranding and ill-defined fluid associated with this extraluminal air. No peripherally enhancing fluid collection. 2. Left inguinal hernia contains tracking air and small amount of non organized free fluid. 3. Distal colonic diverticulosis without acute inflammation. 4. Small bilateral pleural effusions and adjacent atelectasis. Aortic Atherosclerosis (ICD10-I70.0). These results will be called to the ordering clinician or representative by the Radiologist Assistant, and communication documented in the PACS or [employer]. Electronically Signed   By: Keith Rake M.D.   On: 03/14/2022 15:52   MR FEMUR LEFT WO CONTRAST  Result Date: 03/29/2022 CLINICAL DATA:  History of hemorrhoid surgery and now has left thigh pain from hip to the knee. EXAM: MR OF THE LEFT FEMUR WITHOUT CONTRAST TECHNIQUE: Multiplanar, multisequence MR imaging of the left femur was performed. No intravenous contrast was administered. COMPARISON:  None Available. FINDINGS: Incomplete examination, patient was unable to tolerate and complete examination. Only axial T1 and coronal T1 and STIR images were obtained. Bones/Joint/Cartilage No appreciable fracture of the  partially imaged left femur. No appreciable joint effusion. Degenerative changes of the left hip joint without evidence of significant joint effusion. Muscles and Tendons Muscles are normal in bulk and signal. No intramuscular hematoma or significant atrophy. Soft tissues Skin and subcutaneous soft tissues are within normal limits. IMPRESSION: Limited examination of the left femur. Within the above-mentioned limitations no acute osseous or soft tissue abnormality. Electronically Signed   By: Keane Police D.O.   On: 03/29/2022 21:59   DG CHEST PORT 1 VIEW  Result Date: 03/28/2022 CLINICAL DATA:  Fever EXAM: PORTABLE CHEST 1 VIEW COMPARISON:  03/27/2022 FINDINGS: Unchanged AP portable examination with small, layering left pleural effusion. Cardiomegaly. Right upper extremity PICC. IMPRESSION: Unchanged AP portable examination with small, layering left pleural effusion. Cardiomegaly. Electronically Signed   By: Delanna Ahmadi M.D.   On: 03/28/2022 10:34   DG Chest Portable 1 View  Result  Date: 03/27/2022 CLINICAL DATA:  Cough and fever. EXAM: PORTABLE CHEST 1 VIEW COMPARISON:  Mar 19, 2022 FINDINGS: There is stable right-sided PICC line positioning. The cardiac silhouette is mildly enlarged and unchanged in size. Decreased lung volumes are seen. Mild atelectasis and/or infiltrate is noted within the left lung base. A very small left pleural effusion is suspected. No pneumothorax is identified. The visualized skeletal structures are unremarkable. IMPRESSION: Mild left basilar atelectasis and/or infiltrate with a very small left pleural effusion. Electronically Signed   By: Virgina Norfolk M.D.   On: 03/27/2022 01:14   DG CHEST PORT 1 VIEW  Result Date: 03/19/2022 CLINICAL DATA:  Sepsis. EXAM: PORTABLE CHEST 1 VIEW COMPARISON:  PA and lateral 01/15/2019, lung base images from abdomen pelvis CT 03/09/2022. FINDINGS: There is mild cardiomegaly, increased central vascular prominence, evidence of early  interstitial edema in the base of the lungs and small pleural effusions. There is hazy opacity in both lower lung fields which could indicate ground-glass edema, pneumonitis, atelectasis or combination. The mid and upper lung fields are clear.  There is no pneumothorax. Stable mediastinum with mild aortic tortuosity and atherosclerosis. Mild osteopenia. IMPRESSION: Cardiomegaly with mild perihilar vascular congestion, early findings of interstitial edema in the lung bases and small pleural effusions. There are hazy opacities in the lung bases which could be atelectasis, ground-glass edema, pneumonitis or combination. Clinical correlation and radiographic follow-up recommended. Electronically Signed   By: Telford Nab M.D.   On: 03/19/2022 07:39   DG Abd 2 Views  Result Date: 03/17/2022 CLINICAL DATA:  Abdominal pain EXAM: ABDOMEN - 2 VIEW COMPARISON:  CT done on 03/14/2022 FINDINGS: Bowel gas pattern is nonspecific. Extraluminal air seen in the pelvis could not be distinctly identified in the radiographs. There is no definite pneumothorax in the upright radiograph. There is infiltrate in the left lower lung fields suggesting atelectasis/pneumonia. Part of this finding may suggest pleural effusion. There is dextroscoliosis in the lumbar spine. Degenerative changes are noted with bony spurs and facet hypertrophy in the lumbar spine. Degenerative changes are noted in the left hip. IMPRESSION: Nonspecific bowel gas pattern. Increased density in the left lower lung fields suggests pleural effusion and infiltrates. Electronically Signed   By: Elmer Picker M.D.   On: 03/17/2022 16:22   [place] LT LOWER EXTREM LTD SOFT TISSUE NON VASCULAR  Result Date: 03/30/2022 CLINICAL DATA:  71 year old male with history of recent diverting colostomy status post hemorrhoid embolization procedure with development of left lower extremity swelling and possible fluid collections. EXAM: ULTRASOUND LEFT LOWER EXTREMITY LIMITED  TECHNIQUE: Ultrasound examination of the lower extremity soft tissues was performed in the area of clinical concern. COMPARISON:  03/28/2022, 03/29/2022 FINDINGS: Within the mid to distal posterolateral thigh musculature are multifocal, irregularly-shaped heterogeneously hypoechoic structures without significant internal vascularity. Given morphologic irregularity, the fluid collections are difficult to measure in entirety. IMPRESSION: Multifocal, irregularly-shaped mildly complex fluid collections within the mid to distal left posterolateral thigh musculature. Favored to represent abscesses given findings on comparison studies. Ruthann Cancer, MD [facility] Electronically Signed   By: Ruthann Cancer M.D.   On: 03/30/2022 10:06   VAS [place] LOWER EXTREMITY VENOUS (DVT)  Result Date: 03/26/2022  Lower Venous DVT Study Patient Name:  Tyler [name]  Date of Exam:   03/25/2022 Medical Rec #: [id number]          Accession #:    [id number] Date of Birth: Aug 07, 1951  Patient Gender: M Patient Age:   78 years Exam Location:  [facility] Procedure:      VAS [place] LOWER EXTREMITY VENOUS (DVT) Referring Phys: Leighton Ruff --------------------------------------------------------------------------------  Indications: Swelling.  Risk Factors: None identified. Limitations: Patient pain tolerance. Comparison Study: 03-24-2022 Bilateral upper and lower extremity venous studies                   were negative for DVT. Performing Technologist: Darlin Coco RDMS, RVT  Examination Guidelines: A complete evaluation includes B-mode imaging, spectral Doppler, color Doppler, and power Doppler as needed of all accessible portions of each vessel. Bilateral testing is considered an integral part of a complete examination. Limited examinations for reoccurring indications may be performed as noted. The reflux portion of the exam is performed with the patient in reverse  Trendelenburg.  +-----+---------------+---------+-----------+----------+--------------+ RIGHTCompressibilityPhasicitySpontaneityPropertiesThrombus Aging +-----+---------------+---------+-----------+----------+--------------+ CFV  Full           Yes      Yes                                 +-----+---------------+---------+-----------+----------+--------------+   +---------+---------------+---------+-----------+----------+--------------+ LEFT     CompressibilityPhasicitySpontaneityPropertiesThrombus Aging +---------+---------------+---------+-----------+----------+--------------+ CFV      Full           Yes      Yes                                 +---------+---------------+---------+-----------+----------+--------------+ SFJ      Full                                                        +---------+---------------+---------+-----------+----------+--------------+ FV Prox  Full                                                        +---------+---------------+---------+-----------+----------+--------------+ FV Mid   Full                                                        +---------+---------------+---------+-----------+----------+--------------+ FV DistalFull                                                        +---------+---------------+---------+-----------+----------+--------------+ PFV      Full                                                        +---------+---------------+---------+-----------+----------+--------------+ POP      Full           Yes      Yes                                 +---------+---------------+---------+-----------+----------+--------------+  PTV      Full                                                        +---------+---------------+---------+-----------+----------+--------------+ PERO     Full                                                         +---------+---------------+---------+-----------+----------+--------------+ Gastroc  Full                                                        +---------+---------------+---------+-----------+----------+--------------+     Summary: RIGHT: - No evidence of deep vein thrombosis in the lower extremity. No indirect evidence of obstruction proximal to the inguinal ligament.  LEFT: - Findings appear essentially unchanged compared to previous examination performed yesterday.  - There is no evidence of deep vein thrombosis in the lower extremity.  - No cystic structure found in the popliteal fossa.  *See table(s) above for measurements and observations. Electronically signed by Monica Martinez MD on 03/26/2022 at 1:31:02 PM.    Final    VAS [place] LOWER EXTREMITY VENOUS (DVT)  Result Date: 03/24/2022  Lower Venous DVT Study Patient Name:  Tyler [name]  Date of Exam:   03/24/2022 Medical Rec #: [id number]          Accession #:    [id number] Date of Birth: 19-Oct-1951          Patient Gender: M Patient Age:   3 years Exam Location:  [facility] Procedure:      VAS [place] LOWER EXTREMITY VENOUS (DVT) Referring Phys: CHI ELLISON --------------------------------------------------------------------------------  Indications: Swelling.  Risk Factors: None identified. Limitations: Poor ultrasound/tissue interface and patient positioning, patient pain tolerance. Comparison Study: No prior studies. Performing Technologist: Oliver Hum RVT  Examination Guidelines: A complete evaluation includes B-mode imaging, spectral Doppler, color Doppler, and power Doppler as needed of all accessible portions of each vessel. Bilateral testing is considered an integral part of a complete examination. Limited examinations for reoccurring indications may be performed as noted. The reflux portion of the exam is performed with the patient in reverse Trendelenburg.   +---------+---------------+---------+-----------+----------+--------------+ RIGHT    CompressibilityPhasicitySpontaneityPropertiesThrombus Aging +---------+---------------+---------+-----------+----------+--------------+ CFV      Full           Yes      Yes                                 +---------+---------------+---------+-----------+----------+--------------+ SFJ      Full                                                        +---------+---------------+---------+-----------+----------+--------------+ FV Prox  Full                                                        +---------+---------------+---------+-----------+----------+--------------+  FV Mid   Full                                                        +---------+---------------+---------+-----------+----------+--------------+ FV DistalFull                                                        +---------+---------------+---------+-----------+----------+--------------+ PFV      Full                                                        +---------+---------------+---------+-----------+----------+--------------+ POP      Full           Yes      Yes                                 +---------+---------------+---------+-----------+----------+--------------+ PTV      Full                                                        +---------+---------------+---------+-----------+----------+--------------+ PERO     Full                                                        +---------+---------------+---------+-----------+----------+--------------+   +---------+---------------+---------+-----------+----------+-------------------+ LEFT     CompressibilityPhasicitySpontaneityPropertiesThrombus Aging      +---------+---------------+---------+-----------+----------+-------------------+ CFV                     Yes      Yes                                       +---------+---------------+---------+-----------+----------+-------------------+ FV Prox                 Yes      Yes                                      +---------+---------------+---------+-----------+----------+-------------------+ FV Mid                  Yes      Yes                                      +---------+---------------+---------+-----------+----------+-------------------+ FV Distal               Yes      Yes                                      +---------+---------------+---------+-----------+----------+-------------------+  PFV                                                   Not well visualized +---------+---------------+---------+-----------+----------+-------------------+ POP      Full           Yes      Yes                                      +---------+---------------+---------+-----------+----------+-------------------+ PTV      Full                                                             +---------+---------------+---------+-----------+----------+-------------------+ PERO     Full                                                             +---------+---------------+---------+-----------+----------+-------------------+     Summary: RIGHT: - There is no evidence of deep vein thrombosis in the lower extremity. However, portions of this examination were limited- see technologist comments above.  - No cystic structure found in the popliteal fossa.  LEFT: - There is no evidence of deep vein thrombosis in the lower extremity. However, portions of this examination were limited- see technologist comments above.  - No cystic structure found in the popliteal fossa.  *See table(s) above for measurements and observations. Electronically signed by Orlie Pollen on 03/24/2022 at 9:19:55 PM.    Final    VAS [place] UPPER EXTREMITY VENOUS DUPLEX  Result Date: 03/24/2022 UPPER VENOUS STUDY  Patient Name:  Tyler [name]  Date of Exam:   03/24/2022 Medical  Rec #: [id number]          Accession #:    [id number] Date of Birth: July 15, 1951          Patient Gender: M Patient Age:   25 years Exam Location:  [facility] Procedure:      VAS [place] UPPER EXTREMITY VENOUS DUPLEX Referring Phys: CHI ELLISON --------------------------------------------------------------------------------  Indications: Swelling Risk Factors: None identified. Limitations: Poor ultrasound/tissue interface, bandages, line and patient positioning, patient pain tolerance. Comparison Study: No prior studies. Performing Technologist: Oliver Hum RVT  Examination Guidelines: A complete evaluation includes B-mode imaging, spectral Doppler, color Doppler, and power Doppler as needed of all accessible portions of each vessel. Bilateral testing is considered an integral part of a complete examination. Limited examinations for reoccurring indications may be performed as noted.  Right Findings: +----------+------------+---------+-----------+----------+-------+ RIGHT     CompressiblePhasicitySpontaneousPropertiesSummary +----------+------------+---------+-----------+----------+-------+ IJV           Full       Yes       Yes                      +----------+------------+---------+-----------+----------+-------+ Subclavian    Full       Yes       Yes                      +----------+------------+---------+-----------+----------+-------+  Axillary      Full       Yes       Yes                      +----------+------------+---------+-----------+----------+-------+ Brachial      Full       Yes       Yes                      +----------+------------+---------+-----------+----------+-------+ Radial        Full                                          +----------+------------+---------+-----------+----------+-------+ Ulnar         Full                                          +----------+------------+---------+-----------+----------+-------+ Cephalic      Full                                           +----------+------------+---------+-----------+----------+-------+ Basilic       Full                                          +----------+------------+---------+-----------+----------+-------+  Left Findings: +----------+------------+---------+-----------+----------+-------+ LEFT      CompressiblePhasicitySpontaneousPropertiesSummary +----------+------------+---------+-----------+----------+-------+ IJV           Full       Yes       Yes                      +----------+------------+---------+-----------+----------+-------+ Subclavian    Full       Yes       Yes                      +----------+------------+---------+-----------+----------+-------+ Axillary      Full       Yes       Yes                      +----------+------------+---------+-----------+----------+-------+ Brachial      Full       Yes       Yes                      +----------+------------+---------+-----------+----------+-------+ Radial        Full                                          +----------+------------+---------+-----------+----------+-------+ Ulnar         Full                                          +----------+------------+---------+-----------+----------+-------+ Cephalic      None  Acute  +----------+------------+---------+-----------+----------+-------+ Basilic       Full                                          +----------+------------+---------+-----------+----------+-------+  Summary:  Right: No evidence of deep vein thrombosis in the upper extremity. No evidence of superficial vein thrombosis in the upper extremity.  Left: No evidence of deep vein thrombosis in the upper extremity. Findings consistent with acute superficial vein thrombosis involving the left cephalic vein.  *See table(s) above for measurements and observations.  Diagnosing physician: Orlie Pollen Electronically  signed by Orlie Pollen on 03/24/2022 at 9:16:30 PM.    Final    [place] EKG SITE RITE  Result Date: 04/07/2022 If Site Rite image not attached, placement could not be confirmed due to current cardiac rhythm.  [place] FINE NEEDLE ASP 1ST LESION  Result Date: 03/31/2022 INDICATION: 71 year old male with history of recent diverting colostomy status post hemorrhoid embolization procedure with development of left lower extremity intramuscular fluid collections concerning for abscess. EXAM: Ultrasound-guided aspiration of left lower extremity fluid collection MEDICATIONS: The patient is currently admitted to the hospital and receiving intravenous antibiotics. The antibiotics were administered within an appropriate time frame prior to the initiation of the procedure. ANESTHESIA/SEDATION: None. COMPLICATIONS: None immediate. PROCEDURE: Informed written consent was obtained from the patient after a thorough discussion of the procedural risks, benefits and alternatives. All questions were addressed. Maximal Sterile Barrier Technique was utilized including caps, mask, sterile gowns, sterile gloves, sterile drape, hand hygiene and skin antiseptic. A timeout was performed prior to the initiation of the procedure. Preprocedure ultrasound evaluation of the left lower extremity was performed. Within the intramuscular aspect of the posterior left thigh in the mid to distal portions are phlegmonous changes with a few interspersed simple appearing fluid collections. The procedure was planned. The left lower extremity is prepped and draped in standard fashion. Subdermal Local anesthesia was administered at the planned needle entry site with 1% lidocaine. Under direct ultrasound visualization, a 5 [place], 7 cm Yueh catheter was directed into the fluid collection. Aspiration was performed which yielded approximately 10 mL of purulent fluid. Samples were sent for culture. The needle was removed. Postprocedure ultrasound of it patient  demonstrated no evidence of hematoma or other complicating features. A bandage was applied. The patient tolerated the procedure well was transferred back to the ICU in stable condition. IMPRESSION: Technically successful ultrasound-guided aspiration of left posterior thigh intramuscular abscess yielding approximately 10 mL of purulent fluid. Ruthann Cancer, MD [facility] Electronically Signed   By: Ruthann Cancer M.D.   On: 03/31/2022 08:07    Labs:  CBC: Recent Labs    04/07/22 0503 04/09/22 0243 04/09/22 1545 04/10/22 0250  WBC 8.4 5.3 4.3 5.5  HGB 8.6* 8.0* 8.1* 8.2*  HCT 24.8* 23.9* 24.1* 23.1*  PLT 329 311 296 301    COAGS: Recent Labs    03/18/22 0617  INR 1.4*    BMP: Recent Labs    03/23/22 0742 03/23/22 0904 04/01/22 0343 04/04/22 0502 04/04/22 2333 04/09/22 1925 04/10/22 0250  NA QUESTIONABLE RESULTS, RECOMMEND RECOLLECT TO VERIFY   < > 136 138 134*  --  133*  K QUESTIONABLE RESULTS, RECOMMEND RECOLLECT TO VERIFY   < > 3.7 3.5 3.6  --  3.4*  CL QUESTIONABLE RESULTS, RECOMMEND RECOLLECT TO VERIFY   < > 105  108 107  --  100  CO2 QUESTIONABLE RESULTS, RECOMMEND RECOLLECT TO VERIFY   < > 23 24 21*  --  24  GLUCOSE QUESTIONABLE RESULTS, RECOMMEND RECOLLECT TO VERIFY   < > 108* 98 113*  --  101*  BUN QUESTIONABLE RESULTS, RECOMMEND RECOLLECT TO VERIFY   < > 27* 25* 24*  --  12  CALCIUM QUESTIONABLE RESULTS, RECOMMEND RECOLLECT TO VERIFY   < > 7.5* 8.2* 7.7*  --  8.3*  CREATININE QUESTIONABLE RESULTS, RECOMMEND RECOLLECT TO VERIFY   < > 1.06 0.93 0.91 1.05 0.92  GFRNONAA QUESTIONABLE RESULTS, RECOMMEND RECOLLECT TO VERIFY   < > >60 >60 >60 >60 >60  GFRAA QUESTIONABLE RESULTS, RECOMMEND RECOLLECT TO VERIFY  --   --   --   --   --   --    < > = values in this interval not displayed.    LIVER FUNCTION TESTS: Recent Labs    03/29/22 0610 04/01/22 0343 04/04/22 2333 04/08/22 0502  BILITOT 0.5 0.7 0.8 0.8   AST 24 33 51* 26  ALT 16 30 55* 33  ALKPHOS 215* 338* 436* 357*  PROT 5.6* 5.8* 6.3* 6.6  ALBUMIN <1.5* 1.6* 1.9* 2.0*    TUMOR MARKERS: No results for input(s): AFPTM, CEA, CA199, CHROMGRNA in the last 8760 hours.  Assessment and Plan: 71 y/o M admitted 03/09/22 due to abdominal pain, nausea and fever following outpatient transanal hemorrhoidal dearterialization 2/72/53 with Dr. Leighton Ruff ([facility]). 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 CT 5/5 showed significant retroperitoneal contamination and he was taken back to the OR on 5/6 where he was found to have significant RP infection and underwent abdominal washout with 19 Fr Blake placement x 2 (left paracolic gutter, RLQ) and diverting colostomy creation. LLQ drain inadvertently removed 04/06/22. He has had a prolonged hospital course with persistent fevers and has been followed by ID. CT abd/pelvis yesterday showed a new complex left RP fluid collection with superior and inferior components appearing communicate, the inferior portion of this collection previously had a surgical drain however this was dislodged 04/06/22 and not replaced. IR has been consulted for possible aspiration/drain placement within this collection.  Patient history and imaging reviewed by Dr. Denna Haggard who approves patient for aspiration of inferior portion of fluid collection, it is likely too small for percutaneous drain placement however we will leave drain in place if it is found to be larger during procedure. Patient is agreeable to drain placement and asks that Dr. Marcello Moores be notified of procedure -- will reach out to Dr. Marcello Moores today and plan to proceed with aspiration/possible drain placement tomorrow (04/12/22) if she does not feel there is any contraindication.   Orders placed for patient to be NPO at midnight 5/30, hold Lovenox 5/30.   Risks and  benefits discussed with the patient including bleeding, infection, damage to adjacent structures, bowel perforation/fistula connection, and sepsis.  All of the patient's questions were answered, patient is agreeable to proceed.  Consent signed and in chart.  Thank you for this interesting consult.  I greatly enjoyed meeting Tyler [name] and look forward to participating in their care.  A copy of this report was sent to the requesting provider on this date.  Electronically Signed: Joaquim Nam, PA-C 04/11/2022, 2:33 PM   I spent a total of 40 Minutes in face to face in clinical consultation, greater than  50% of which was counseling/coordinating care for intra-abdominal fluid collection aspiration/possible drain placement.

## 2022-04-11 NOTE — Progress Notes (Signed)
PROGRESS NOTE   Subjective/Complaints:  Patient has been afebrile today with normal heart rate.  Was evaluated and is currently being followed by ID for febrile, tachycardia, and diaphoretic event overnight 5/26-5/27.  ID is recommending evaluation for another abdominal drain to be placed based on fluid accumulation on repeat CT abdomen on 04/10/22. Patient is resting comfortably but has voiced that he would like Dr. Marcello Moores (General Surgery) to be notified of new developments.  Review of Systems  Constitutional:  Positive for diaphoresis and fever.       Fever and diaphoresis overnight 04/09/22-04/10/22 have resolved.  HENT:  Negative for congestion and sinus pain.   Eyes: Negative.   Respiratory:  Positive for cough and shortness of breath. Negative for wheezing.        Cough and shortness of breath overnight 04/09/22-04/10/22 have resolved.  Cardiovascular:  Negative for chest pain and palpitations.  Gastrointestinal:  Negative for diarrhea, nausea and vomiting.  Genitourinary:  Positive for urgency. Negative for dysuria.  Musculoskeletal:  Positive for joint pain and myalgias.  Neurological:  Negative for dizziness, weakness and headaches.  Endo/Heme/Allergies: Negative.   Psychiatric/Behavioral:  Negative for depression. The patient is nervous/anxious. The patient does not have insomnia.     Objective:   DG Chest 2 View  Result Date: 04/09/2022 CLINICAL DATA:  Shortness of breath. EXAM: CHEST - 2 VIEW COMPARISON:  03/28/2022. FINDINGS: Heart is enlarged and the mediastinal contour is stable. There is a small pleural effusion on the left with atelectasis. The right lung is clear. No pneumothorax. A right PICC line terminates over the superior vena cava. No acute osseous abnormality. IMPRESSION: Small left pleural effusion with atelectasis at the left lung base. Electronically Signed   By: Brett Fairy M.D.   On: 04/09/2022 22:33   CT  ABDOMEN PELVIS W CONTRAST  Result Date: 04/10/2022 CLINICAL DATA:  Intra-abdominal abscess EXAM: CT ABDOMEN AND PELVIS WITH CONTRAST TECHNIQUE: Multidetector CT imaging of the abdomen and pelvis was performed using the standard protocol following bolus administration of intravenous contrast. RADIATION DOSE REDUCTION: This exam was performed according to the departmental dose-optimization program which includes automated exposure control, adjustment of the mA and/or kV according to patient size and/or use of iterative reconstruction technique. CONTRAST:  138m OMNIPAQUE IOHEXOL 300 MG/ML  SOLN COMPARISON:  04/05/2021 FINDINGS: Lower chest: Small left pleural effusion and associated atelectasis or consolidation, similar to prior examination. Coronary artery disease. Hepatobiliary: No solid liver abnormality is seen. No gallstones, gallbladder wall thickening, or biliary dilatation. Pancreas: Unremarkable. No pancreatic ductal dilatation or surrounding inflammatory changes. Spleen: Normal in size without significant abnormality. Adrenals/Urinary Tract: Adrenal glands are unremarkable. Kidneys are normal, without renal calculi, solid lesion, or hydronephrosis. Bladder is unremarkable. Stomach/Bowel: Stomach is within normal limits. Appendix is surgically absent. Left lower quadrant diverting colostomy. Vascular/Lymphatic: Aortic atherosclerosis. No enlarged abdominal or pelvic lymph nodes. Reproductive: No mass or other significant abnormality. Other: Left lower quadrant colostomy. There is a complex left retroperitoneal fluid collection, with components that appear to communicate overlying the Gerota's fascia and left psoas musculature (series 3, image 39, 59). A previously seen surgical drain within the inferior aspect of this collection has been  removed. The superior aspect of the collection is diminished in size, measuring 3.2 x 2.3 cm, previously 4.4 x 2.8 cm (series 3, image 37). Inferior aspect of the  collection is similar, measuring 4.7 x 1.6 cm (series 3, image 59). Additional presacral air and fluid collection contains 1 surgical drain, others removed and is similar in size, measuring 7.9 x 2.5 cm (series 3, image 75). Anasarca. Musculoskeletal: No acute or significant osseous findings. IMPRESSION: 1. There is a complex left retroperitoneal fluid collection, with superior and inferior components that appear to communicate overlying the Gerota's fascia and left psoas musculature. A previously seen surgical drain within the inferior aspect of this collection has been removed. The superior aspect of the collection is diminished in size, measuring 3.2 x 2.3 cm, previously 4.4 x 2.8 cm. Inferior aspect of the collection is similar, measuring 4.7 x 1.6 cm. 2. Additional presacral air and fluid collection contains one surgical drain, others removed and is similar in size, measuring 7.9 x 2.5 cm. 3. Left lower quadrant diverting colostomy. 4. Small left pleural effusion and associated atelectasis or consolidation, similar to prior examination. 5. Coronary artery disease. Aortic Atherosclerosis (ICD10-I70.0). Electronically Signed   By: Delanna Ahmadi M.D.   On: 04/10/2022 16:11   Recent Labs    04/09/22 1545 04/10/22 0250  WBC 4.3 5.5  HGB 8.1* 8.2*  HCT 24.1* 23.1*  PLT 296 301    Recent Labs    04/09/22 1925 04/10/22 0250  NA  --  133*  K  --  3.4*  CL  --  100  CO2  --  24  GLUCOSE  --  101*  BUN  --  12  CREATININE 1.05 0.92  CALCIUM  --  8.3*     Intake/Output Summary (Last 24 hours) at 04/11/2022 1648 Last data filed at 04/11/2022 1108 Gross per 24 hour  Intake 879.51 ml  Output 1600 ml  Net -720.49 ml      Pressure Injury 04/09/22 Heel Left Deep Tissue Pressure Injury - Purple or maroon localized area of discolored intact skin or blood-filled blister due to damage of underlying soft tissue from pressure and/or shear. (Active)  04/09/22 1315  Location: Heel  Location  Orientation: Left  Staging: Deep Tissue Pressure Injury - Purple or maroon localized area of discolored intact skin or blood-filled blister due to damage of underlying soft tissue from pressure and/or shear.  Wound Description (Comments):   Present on Admission: Yes     Pressure Injury 04/09/22 Heel Right Deep Tissue Pressure Injury - Purple or maroon localized area of discolored intact skin or blood-filled blister due to damage of underlying soft tissue from pressure and/or shear. (Active)  04/09/22 1315  Location: Heel  Location Orientation: Right  Staging: Deep Tissue Pressure Injury - Purple or maroon localized area of discolored intact skin or blood-filled blister due to damage of underlying soft tissue from pressure and/or shear.  Wound Description (Comments):   Present on Admission: Yes    Physical Exam: Vital Signs Blood pressure 119/87, pulse (!) 101, temperature 99.8 F (37.7 C), resp. rate 16, height '5\' 9"'$  (1.753 m), weight 75.1 kg, SpO2 97 %.   General: Alert and oriented x 3, No apparent distress HEENT: Head is normocephalic, atraumatic, PERRLA, EOMI, sclera anicteric, oral mucosa pink and moist, dentition intact, ext ear canals clear,  Neck: Supple without JVD or lymphadenopathy Heart: Reg rate and rhythm. No murmurs rubs or gallops Chest: CTA bilaterally without wheezes, rales, or rhonchi; no  distress Abdomen: Soft, mild tenderness, bowel sounds positive. Colostomy viable with stool and air.  Stool is partially formed. Comments: pelvis JP in place, had second RP former drain site which inadvertently came out.  Pelvis JP drain x2 with small amount of yellow-white drainage. Skin: Slightly erythematous, but skin intact at former drain site. Stoma aroma colostomy intact. Heel protectors are on both heels. There is a small area of redness on lateral aspect of left heel and medial aspect of right heel.  But skin is intact without ulceration. Extremities: No clubbing, cyanosis,  or edema. Pulses are 2+,  Neuro: Patient is alert and oriented x3, sitting up in bed, follows commands, No focal deficits. Musculoskeletal: FROM all 4 extremities with exception of having a difficult time crossing one leg over the other. Strength: 5/5 overall, BUE and BLE.  Sensory: no deficits. Psych: Pt's affect is appropriate. Pt is cooperative.  Assessment/Plan: 1. Functional deficits which require 3+ hours per day of interdisciplinary therapy in a comprehensive inpatient rehab setting. Physiatrist is providing close team supervision and 24 hour management of active medical problems listed below. Physiatrist and rehab team continue to assess barriers to discharge/monitor patient progress toward functional and medical goals  Care Tool:  Bathing    Body parts bathed by patient: Right arm, Left arm, Chest, Abdomen, Face   Body parts bathed by helper:  (patient preferring to wait to do LB to allow nursing to do dressing changes.)     Bathing assist Assist Level: Maximal Assistance - Patient 24 - 49%     Upper Body Dressing/Undressing Upper body dressing   What is the patient wearing?: Hospital gown only (PICC line - actively running, unable to be disconnected)    Upper body assist Assist Level: Moderate Assistance - Patient 50 - 74%    Lower Body Dressing/Undressing Lower body dressing      What is the patient wearing?: Incontinence brief     Lower body assist Assist for lower body dressing: Total Assistance - Patient < 25%     Toileting Toileting    Toileting assist Assist for toileting: Minimal Assistance - Patient > 75%     Transfers Chair/bed transfer  Transfers assist     Chair/bed transfer assist level: Contact Guard/Touching assist     Locomotion Ambulation   Ambulation assist      Assist level: Minimal Assistance - Patient > 75% Assistive device: Walker-rolling Max distance: 15   Walk 10 feet activity   Assist     Assist level: Minimal  Assistance - Patient > 75% Assistive device: Walker-rolling   Walk 50 feet activity   Assist Walk 50 feet with 2 turns activity did not occur: Safety/medical concerns         Walk 150 feet activity   Assist Walk 150 feet activity did not occur: Safety/medical concerns         Walk 10 feet on uneven surface  activity   Assist Walk 10 feet on uneven surfaces activity did not occur: Safety/medical concerns         Wheelchair     Assist Is the patient using a wheelchair?: Yes Type of Wheelchair: Manual    Wheelchair assist level: Dependent - Patient 0% Max wheelchair distance: 150    Wheelchair 50 feet with 2 turns activity    Assist        Assist Level: Dependent - Patient 0%   Wheelchair 150 feet activity     Assist  Assist Level: Dependent - Patient 0%   Blood pressure 119/87, pulse (!) 101, temperature 99.8 F (37.7 C), resp. rate 16, height '5\' 9"'$  (1.753 m), weight 75.1 kg, SpO2 97 %.   Medical Problem List and Plan: 1. Functional deficits secondary to debility secondary to pelvic abscess.  Status post laparoscopy, abdominal washout drain placement with creation of diverting colostomy 03/20/2022             -patient may shower but incisions/drains should be covered             -ELOS/Goals: 10-14 days modI             -Admit to CIR 2.  Antithrombotics: -DVT/anticoagulation:  Pharmaceutical: Lovenox.  Venous Doppler negative             -antiplatelet therapy: N/A      5/27 Continue Lovenox 5/28 Pt will have CT guided abdominal fluid drainage either 5/29 or 5/30 with Lovenox to be held the morning of the procedure. 3. Pain Management: Robaxin 1000 mg 3 times daily, tramadol as needed      5/27 Pt not reporting issues with pain today. 4. Mood: Ativan as needed as well as trazodone             -antipsychotic agents: N/A 5. Neuropsych: This patient is capable of making decisions on his own behalf. 6. Skin/Wound Care: Routine skin checks  with colostomy education 7. Fluids/Electrolytes/Nutrition: Routine in and outs with follow-up chemistries 5/27 Slightly low sodium (133) and potassium (3.4), continue to trend, consider repleting  BMET    Component Value Date/Time   NA 133 (L) 04/10/2022 0250   K 3.4 (L) 04/10/2022 0250   CL 100 04/10/2022 0250   CO2 24 04/10/2022 0250   GLUCOSE 101 (H) 04/10/2022 0250   BUN 12 04/10/2022 0250   CREATININE 0.92 04/10/2022 0250   CALCIUM 8.3 (L) 04/10/2022 0250   GFRNONAA >60 04/10/2022 0250     8.  ID/intra-abdominal infection.  Continue micafungin 100 mg daily as well as Zosyn 3.375 g every 8 hours.  Plan is for 6 weeks starting date 5/16 ending 05/11/2022 and will need repeat CT abdomen/pelvis on left thigh in 1 to 2 weeks -5/27 Pt has event last night on 5/26 of temp 101.6, HR 115, new onset cough, with shortness of breath.  Per night nurse on 5/26-5/27 became diaphoretic around 0000 on 5/27 and drenched the bed with sweat, with no subsequent fevers or episodes of tachycardia. Orders were placed for CBC, BMET, blood cultures x2, CXR, COVID, respiratory panel, and repeat CT abd/pelvis. ID was consulted the evening of 5/26 with recommendation given to continue current regimen of 6 week Zosyn and micafungin based on original plan.  Per [place], CT abdomen/pelvis indicated with febrile event, especially in light that patient's last CT scan was 04/05/22 and he inadvertently lost one of his JP drains on the morning of 04/06/22. -Respiratory panel and COVID negative.  CXR shows Small left pleural effusion with atelectasis at the left lung base. -CT abd/pelvis: demonstrates a complex left retroperitoneal fluid collection, with superior and inferior components that appear to communicate overlying the Gerota's fascia and left psoas musculature. ID to round on pt on 04/11/22 and will decide next steps. 04/11/22 See ID and Radiology note.  IR was consulted for intra-abdominal fluid collection. Dr.  Denna Haggard (IR) has approved patient for aspiration of inferior portion of fluid collection. -Per Radiology note, PA will reach out to General Surgery (Dr. Marcello Moores)  to notify of plan. Also, called Dr. Rosendo Gros, MD on-call for General Surgery this weekend to update office on patient since surgery signed off on 04/09/22.  I was unable to reach provider since he was in-procedure.  But relayed message by telephone to nurse, Karena Addison, [place] of patient's febrile episode 04/09/22-04/10/22 and plans for abdominal fluid removal and possible drain placement by IR. 9.  Postoperative anemia.  Continue iron supplement.  Follow-up CBC 5/28 Hgb 8.2 low but stable, continue to trend and monitor.     Latest Ref Rng & Units 04/10/2022    2:50 AM 04/09/2022    3:45 PM 04/09/2022    2:43 AM  CBC  WBC 4.0 - 10.5 K/uL 5.5   4.3   5.3    Hemoglobin 13.0 - 17.0 g/dL 8.2   8.1   8.0    Hematocrit 39.0 - 52.0 % 23.1   24.1   23.9    Platelets 150 - 400 K/uL 301   296   311      10.  Hypertension.  Continue Zebeta 10 mg nightly,   Monitor with increased mobility 5/28 BP appears well controlled, continue Zebeta 10 mg nightly.    04/11/2022    3:20 PM 04/11/2022    7:04 AM 04/10/2022    7:35 PM  Vitals with BMI  Systolic [phone]  Diastolic 87 86 85  Pulse 672 85 100    11.  Decreased nutritional storage.  Dietary follow-up. Continue Prosource.  12.  Urinary retention.  Foley tube removed and urinating.  Check PVR 5/27 Pt is voiding okay, reported that he had some urinary incontinence for the first day or two immediately following Foley removal, which has since resolved. 5/28 No issues with voiding.    LOS: 2 days A FACE TO FACE EVALUATION WAS PERFORMED  Luetta Nutting 04/11/2022, 4:48 PM

## 2022-04-11 NOTE — Progress Notes (Signed)
Subjective:   On the whole he feels better he did not like the idea of having another drain placed but I think if radiology believes this is necessary he certainly should and I think source control is critical   Antibiotics:  Anti-infectives (From admission, onward)    Start     Dose/Rate Route Frequency Ordered Stop   04/10/22 1000  vancomycin (VANCOCIN) IVPB 1000 mg/200 mL premix  Status:  Discontinued        1,000 mg 200 mL/hr over 60 Minutes Intravenous Every 12 hours 04/09/22 2211 04/09/22 2228   04/09/22 2300  vancomycin (VANCOREADY) IVPB 1500 mg/300 mL  Status:  Discontinued        1,500 mg 150 mL/hr over 120 Minutes Intravenous  Once 04/09/22 2211 04/09/22 2228   04/09/22 1700  piperacillin-tazobactam (ZOSYN) IVPB 3.375 g        3.375 g 12.5 mL/hr over 240 Minutes Intravenous Every 8 hours 04/09/22 1340 05/11/22 2359   04/09/22 1430  micafungin (MYCAMINE) 100 mg in sodium chloride 0.9 % 100 mL IVPB        100 mg 105 mL/hr over 1 Hours Intravenous Every 24 hours 04/09/22 1340 05/11/22 2359       Medications: Scheduled Meds:  (feeding supplement) PROSource Plus  30 mL Oral BID BM   vitamin C  500 mg Oral BID   bisoprolol  10 mg Oral QHS   Chlorhexidine Gluconate Cloth  6 each Topical Daily   enoxaparin (LOVENOX) injection  40 mg Subcutaneous Q24H   famotidine  20 mg Oral BID   feeding supplement  237 mL Oral TID BM   ferrous sulfate  325 mg Oral BID WC   lip balm  1 application. Topical BID   loperamide  2 mg Oral TID with meals   methocarbamol  1,000 mg Oral TID   montelukast  10 mg Oral QHS   multivitamin with minerals  1 tablet Oral Daily   pantoprazole  40 mg Oral Daily   polycarbophil  625 mg Oral BID   Continuous Infusions:  micafungin (MYCAMINE) IV 100 mg (04/10/22 1342)   ondansetron (ZOFRAN) IV     piperacillin-tazobactam (ZOSYN)  IV 3.375 g (04/11/22 0810)   PRN Meds:.acetaminophen, albuterol, alum & mag hydroxide-simeth, azelastine,  ibuprofen, LORazepam, ondansetron (ZOFRAN) IV **OR** ondansetron (ZOFRAN) IV, ondansetron, simethicone, traMADol, traZODone    Objective: Weight change:   Intake/Output Summary (Last 24 hours) at 04/11/2022 1319 Last data filed at 04/11/2022 1108 Gross per 24 hour  Intake 879.51 ml  Output 1600 ml  Net -720.49 ml    Blood pressure 129/86, pulse 85, temperature 98.2 F (36.8 C), temperature source Oral, resp. rate 18, height '5\' 9"'$  (1.753 m), weight 75.1 kg, SpO2 100 %. Temp:  [98.2 F (36.8 C)-100.9 F (38.3 C)] 98.2 F (36.8 C) (05/28 0704) Pulse Rate:  [85-101] 85 (05/28 0704) Resp:  [16-19] 18 (05/28 0704) BP: (118-135)/(80-86) 129/86 (05/28 0704) SpO2:  [99 %-100 %] 100 % (05/28 0704)  Physical Exam: Physical Exam Constitutional:      Appearance: He is well-developed.  HENT:     Head: Normocephalic and atraumatic.  Eyes:     Conjunctiva/sclera: Conjunctivae normal.  Cardiovascular:     Rate and Rhythm: Normal rate and regular rhythm.  Pulmonary:     Effort: Pulmonary effort is normal. No respiratory distress.     Breath sounds: No wheezing.  Abdominal:     General: There is no  distension.     Palpations: Abdomen is soft.  Musculoskeletal:        General: Normal range of motion.     Cervical back: Normal range of motion and neck supple.  Skin:    General: Skin is warm and dry.     Findings: No erythema or rash.  Neurological:     General: No focal deficit present.     Mental Status: He is alert and oriented to person, place, and time.  Psychiatric:        Mood and Affect: Mood normal.        Behavior: Behavior normal.        Thought Content: Thought content normal.        Judgment: Judgment normal.    Drains with purulent material present   CBC:    BMET Recent Labs    04/09/22 1925 04/10/22 0250  NA  --  133*  K  --  3.4*  CL  --  100  CO2  --  24  GLUCOSE  --  101*  BUN  --  12  CREATININE 1.05 0.92  CALCIUM  --  8.3*      Liver  Panel  No results for input(s): PROT, ALBUMIN, AST, ALT, ALKPHOS, BILITOT, BILIDIR, IBILI in the last 72 hours.      Sedimentation Rate No results for input(s): ESRSEDRATE in the last 72 hours. C-Reactive Protein No results for input(s): CRP in the last 72 hours.  Micro Results: Recent Results (from the past 720 hour(s))  Surgical PCR screen     Status: None   Collection Time: 03/15/22 12:00 PM   Specimen: Nasal Mucosa; Nasal Swab  Result Value Ref Range Status   MRSA, PCR NEGATIVE NEGATIVE Final   Staphylococcus aureus NEGATIVE NEGATIVE Final    Comment: (NOTE) The Xpert SA Assay (FDA approved for NASAL specimens in patients 4 years of age and older), is one component of a comprehensive surveillance program. It is not intended to diagnose infection nor to guide or monitor treatment. Performed at [facility], [place] [address], [place], [place] [address]   MRSA Next Gen by PCR, Nasal     Status: None   Collection Time: 03/17/22  3:46 PM   Specimen: Nasal Mucosa; Nasal Swab  Result Value Ref Range Status   MRSA by PCR Next Gen NOT DETECTED NOT DETECTED Final    Comment: (NOTE) The GeneXpert MRSA Assay (FDA approved for NASAL specimens only), is one component of a comprehensive MRSA colonization surveillance program. It is not intended to diagnose MRSA infection nor to guide or monitor treatment for MRSA infections. Test performance is not FDA approved in patients less than 47 years old. Performed at [facility], [place] [address], [place], [place] [address]   Culture, blood (Routine X 2) w Reflex to ID Panel     Status: None   Collection Time: 03/17/22  4:11 PM   Specimen: BLOOD  Result Value Ref Range Status   Specimen Description   Final    BLOOD BLOOD LEFT ARM Performed at [facility] [address], [place], [place] [address]    Special Requests   Final    BOTTLES DRAWN AEROBIC ONLY Blood Culture results  may not be optimal due to an inadequate volume of blood received in culture bottles Performed at [facility] [address], [place], [place] [address]    Culture   Final    NO  GROWTH 5 DAYS Performed at [facility], [place] [address] [place], [place] [address]    Report Status 03/22/2022 FINAL  Final  Culture, blood (Routine X 2) w Reflex to ID Panel     Status: None   Collection Time: 03/17/22  4:11 PM   Specimen: BLOOD  Result Value Ref Range Status   Specimen Description   Final    BLOOD BLOOD LEFT HAND Performed at [facility] [address] [place], [place] [address]    Special Requests   Final    BOTTLES DRAWN AEROBIC ONLY Blood Culture adequate volume Performed at [facility] [address], [place], [place] [address]    Culture   Final    NO GROWTH 5 DAYS Performed at [facility], [place] [address], [place], [place] [address]    Report Status 03/22/2022 FINAL  Final  Aerobic/Anaerobic Culture w Gram Stain (surgical/deep wound)     Status: None   Collection Time: 03/20/22  8:44 AM   Specimen: PATH Soft tissue  Result Value Ref Range Status   Specimen Description   Final    TISSUE PATH SOFT TISSUE Performed at [facility] [address] [place], [place] [address]    Special Requests   Final    TISSUE Performed at [facility], [place] [address], [place], [place] [address]    Gram Stain   Final    NO SQUAMOUS EPITHELIAL CELLS SEEN MODERATE WBC SEEN ABUNDANT GRAM POSITIVE COCCI Performed at [facility], [place] [address], [place], [place] [address]    Culture   Final    FEW ESCHERICHIA COLI FEW ENTEROCOCCUS FAECALIS MIXED ANAEROBIC FLORA PRESENT.  CALL LAB IF FURTHER IID REQUIRED.    Report Status 03/25/2022 FINAL  Final   Organism ID, Bacteria ESCHERICHIA COLI  Final   Organism ID, Bacteria ENTEROCOCCUS FAECALIS  Final      Susceptibility    Escherichia coli - MIC*    AMPICILLIN >=32 RESISTANT Resistant     CEFAZOLIN <=4 SENSITIVE Sensitive     CEFEPIME <=0.12 SENSITIVE Sensitive     CEFTAZIDIME <=1 SENSITIVE Sensitive     CEFTRIAXONE <=0.25 SENSITIVE Sensitive     CIPROFLOXACIN <=0.25 SENSITIVE Sensitive     GENTAMICIN <=1 SENSITIVE Sensitive     IMIPENEM <=0.25 SENSITIVE Sensitive     TRIMETH/SULFA <=20 SENSITIVE Sensitive     AMPICILLIN/SULBACTAM 16 INTERMEDIATE Intermediate     PIP/TAZO <=4 SENSITIVE Sensitive     * FEW ESCHERICHIA COLI   Enterococcus faecalis - MIC*    AMPICILLIN <=2 SENSITIVE Sensitive     VANCOMYCIN 1 SENSITIVE Sensitive     GENTAMICIN SYNERGY SENSITIVE Sensitive     * FEW ENTEROCOCCUS FAECALIS  Culture, blood (Routine X 2) w Reflex to ID Panel     Status: None   Collection Time: 03/25/22 10:30 AM   Specimen: BLOOD  Result Value Ref Range Status   Specimen Description   Final    BLOOD LEFT ANTECUBITAL Performed at [facility] [address] [place], [place] [address]    Special Requests   Final    IN PEDIATRIC BOTTLE Blood Culture adequate volume Performed at [facility] [address], [place], [place] [address]    Culture   Final    NO GROWTH 5 DAYS Performed at [facility], [place] [address] [place], [place] [address]    Report Status 03/30/2022  FINAL  Final  Culture, blood (Routine X 2) w Reflex to ID Panel     Status: None   Collection Time: 03/25/22 10:37 AM   Specimen: BLOOD LEFT HAND  Result Value Ref Range Status   Specimen Description   Final    BLOOD LEFT HAND Performed at [facility] [address], [place], [place] [address]    Special Requests   Final    IN PEDIATRIC BOTTLE Blood Culture adequate volume Performed at [facility] [address], [place], [place] [address]    Culture   Final    NO GROWTH 5 DAYS Performed at [facility], [place] [address] [place], [place]  [address]    Report Status 03/30/2022 FINAL  Final  Aerobic/Anaerobic Culture w Gram Stain (surgical/deep wound)     Status: None   Collection Time: 03/30/22  5:00 PM   Specimen: Abscess  Result Value Ref Range Status   Specimen Description   Final    ABSCESS Performed at [facility] [address] [place] [address], [place], [place] [address]    Special Requests LEFT LEG  Final   Gram Stain   Final    ABUNDANT WBC PRESENT, PREDOMINANTLY PMN NO ORGANISMS SEEN    Culture   Final    RARE CANDIDA ALBICANS NO ANAEROBES ISOLATED Performed at [facility], [place] [address], [place], [place] [address]    Report Status 04/04/2022 FINAL  Final  Respiratory (~20 pathogens) panel by PCR     Status: None   Collection Time: 04/09/22 11:00 PM   Specimen: Nasopharyngeal Swab; Respiratory  Result Value Ref Range Status   Adenovirus NOT DETECTED NOT DETECTED Final   Coronavirus 229E NOT DETECTED NOT DETECTED Final    Comment: (NOTE) The Coronavirus on the Respiratory Panel, DOES NOT test for the novel  Coronavirus (2019 nCoV)    Coronavirus HKU1 NOT DETECTED NOT DETECTED Final   Coronavirus NL63 NOT DETECTED NOT DETECTED Final   Coronavirus OC43 NOT DETECTED NOT DETECTED Final   Metapneumovirus NOT DETECTED NOT DETECTED Final   Rhinovirus / Enterovirus NOT DETECTED NOT DETECTED Final   Influenza A NOT DETECTED NOT DETECTED Final   Influenza B NOT DETECTED NOT DETECTED Final   Parainfluenza Virus 1 NOT DETECTED NOT DETECTED Final   Parainfluenza Virus 2 NOT DETECTED NOT DETECTED Final   Parainfluenza Virus 3 NOT DETECTED NOT DETECTED Final   Parainfluenza Virus 4 NOT DETECTED NOT DETECTED Final   Respiratory Syncytial Virus NOT DETECTED NOT DETECTED Final   Bordetella pertussis NOT DETECTED NOT DETECTED Final   Bordetella Parapertussis NOT DETECTED NOT DETECTED Final   Chlamydophila pneumoniae NOT DETECTED NOT DETECTED Final   Mycoplasma pneumoniae NOT DETECTED NOT DETECTED Final     Comment: Performed at [facility], [place] [address] [place], [place] [address]  SARS Coronavirus 2 by RT PCR (hospital order, performed in [facility] hospital lab) *cepheid single result test*     Status: None   Collection Time: 04/09/22 11:00 PM  Result Value Ref Range Status   SARS Coronavirus 2 by RT PCR NEGATIVE NEGATIVE Final    Comment: (NOTE) SARS-CoV-2 target nucleic acids are NOT DETECTED.  The SARS-CoV-2 RNA is generally detectable in upper and lower respiratory specimens during the acute phase of infection. The lowest concentration of SARS-CoV-2 viral copies this assay can detect is 250 copies / mL. A negative result does not preclude SARS-CoV-2 infection and should not be  used as the sole basis for treatment or other patient management decisions.  A negative result may occur with improper specimen collection / handling, submission of specimen other than nasopharyngeal swab, presence of viral mutation(s) within the areas targeted by this assay, and inadequate number of viral copies (<250 copies / mL). A negative result must be combined with clinical observations, patient history, and epidemiological information.  Fact Sheet for Patients:   https://www.patel.info/  Fact Sheet for Healthcare Providers: https://hall.com/  This test is not yet approved or  cleared by the [place] FDA and has been authorized for detection and/or diagnosis of SARS-CoV-2 by FDA under an Emergency Use Authorization (EUA).  This EUA will remain in effect (meaning this test can be used) for the duration of the COVID-19 declaration under Section 564(b)(1) of the Act, 21 U.S.C. section 360bbb-3(b)(1), unless the authorization is terminated or revoked sooner.  Performed at [facility], [place] [address] [place], [place] [address]   Culture, blood (Routine X 2) w Reflex to ID Panel     Status: None (Preliminary result)   Collection Time:  04/10/22  2:50 AM   Specimen: BLOOD LEFT FOREARM  Result Value Ref Range Status   Specimen Description BLOOD LEFT FOREARM  Final   Special Requests   Final    BOTTLES DRAWN AEROBIC AND ANAEROBIC Blood Culture results may not be optimal due to an excessive volume of blood received in culture bottles   Culture   Final    NO GROWTH < 12 HOURS Performed at [facility], [place] [address] [place], [place] [address]    Report Status PENDING  Incomplete  Culture, blood (Routine X 2) w Reflex to ID Panel     Status: None (Preliminary result)   Collection Time: 04/10/22  2:50 AM   Specimen: BLOOD LEFT HAND  Result Value Ref Range Status   Specimen Description BLOOD LEFT HAND  Final   Special Requests   Final    BOTTLES DRAWN AEROBIC AND ANAEROBIC Blood Culture results may not be optimal due to an excessive volume of blood received in culture bottles   Culture   Final    NO GROWTH < 12 HOURS Performed at [facility], [place] [address], [place], [place] [address]    Report Status PENDING  Incomplete    Studies/Results: DG Chest 2 View  Result Date: 04/09/2022 CLINICAL DATA:  Shortness of breath. EXAM: CHEST - 2 VIEW COMPARISON:  03/28/2022. FINDINGS: Heart is enlarged and the mediastinal contour is stable. There is a small pleural effusion on the left with atelectasis. The right lung is clear. No pneumothorax. A right PICC line terminates over the superior vena cava. No acute osseous abnormality. IMPRESSION: Small left pleural effusion with atelectasis at the left lung base. Electronically Signed   By: Brett Fairy M.D.   On: 04/09/2022 22:33   CT ABDOMEN PELVIS W CONTRAST  Result Date: 04/10/2022 CLINICAL DATA:  Intra-abdominal abscess EXAM: CT ABDOMEN AND PELVIS WITH CONTRAST TECHNIQUE: Multidetector CT imaging of the abdomen and pelvis was performed using the standard protocol following bolus administration of intravenous contrast. RADIATION DOSE REDUCTION: This exam was performed  according to the departmental dose-optimization program which includes automated exposure control, adjustment of the mA and/or kV according to patient size and/or use of iterative reconstruction technique. CONTRAST:  149m OMNIPAQUE IOHEXOL 300 MG/ML  SOLN COMPARISON:  04/05/2021 FINDINGS: Lower chest: Small left pleural effusion and associated atelectasis or consolidation, similar to prior  examination. Coronary artery disease. Hepatobiliary: No solid liver abnormality is seen. No gallstones, gallbladder wall thickening, or biliary dilatation. Pancreas: Unremarkable. No pancreatic ductal dilatation or surrounding inflammatory changes. Spleen: Normal in size without significant abnormality. Adrenals/Urinary Tract: Adrenal glands are unremarkable. Kidneys are normal, without renal calculi, solid lesion, or hydronephrosis. Bladder is unremarkable. Stomach/Bowel: Stomach is within normal limits. Appendix is surgically absent. Left lower quadrant diverting colostomy. Vascular/Lymphatic: Aortic atherosclerosis. No enlarged abdominal or pelvic lymph nodes. Reproductive: No mass or other significant abnormality. Other: Left lower quadrant colostomy. There is a complex left retroperitoneal fluid collection, with components that appear to communicate overlying the Gerota's fascia and left psoas musculature (series 3, image 39, 59). A previously seen surgical drain within the inferior aspect of this collection has been removed. The superior aspect of the collection is diminished in size, measuring 3.2 x 2.3 cm, previously 4.4 x 2.8 cm (series 3, image 37). Inferior aspect of the collection is similar, measuring 4.7 x 1.6 cm (series 3, image 59). Additional presacral air and fluid collection contains 1 surgical drain, others removed and is similar in size, measuring 7.9 x 2.5 cm (series 3, image 75). Anasarca. Musculoskeletal: No acute or significant osseous findings. IMPRESSION: 1. There is a complex left retroperitoneal fluid  collection, with superior and inferior components that appear to communicate overlying the Gerota's fascia and left psoas musculature. A previously seen surgical drain within the inferior aspect of this collection has been removed. The superior aspect of the collection is diminished in size, measuring 3.2 x 2.3 cm, previously 4.4 x 2.8 cm. Inferior aspect of the collection is similar, measuring 4.7 x 1.6 cm. 2. Additional presacral air and fluid collection contains one surgical drain, others removed and is similar in size, measuring 7.9 x 2.5 cm. 3. Left lower quadrant diverting colostomy. 4. Small left pleural effusion and associated atelectasis or consolidation, similar to prior examination. 5. Coronary artery disease. Aortic Atherosclerosis (ICD10-I70.0). Electronically Signed   By: Delanna Ahmadi M.D.   On: 04/10/2022 16:11      Assessment/Plan:  INTERVAL HISTORY: Became febrile and diaphoretic last night   Principal Problem:   Debility    Tyler [name] is a 71 y.o. male who had undergone transanal hemorrhoidal dearterialization in April 21 with general surgery.  Unfortunately he then developed nausea abdominal pain and fevers.  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  Repeat CT in May 5 was performed due to ongoing sepsis physiology and showed significant retroperitoneal contamination and was taken back to the operating room again on May 6 and had creation of a diverting colostomy and placement of drain x3 culture the OR grew E. coli Enterococcus faecalis and anaerobes.  Follow-up CT on May 12 that showed edema inflammation in the soft tissue and gas in the pelvic floor in the presacral space with surgical drains in place he had 5.3 x 3.5 x 7.2 cm fluid collection left upper quadrant as well is intramuscular fluid and edema.  IR was consulted for possible drainage of left  upper quadrant fluid collection however at the time was deemed too risky to pursue this.  He has been on Zosyn which is active against all the organisms that have been isolated so far.  Repeat CT of the abdomen pelvis on May 22nd that showed improvement in bilateral pleural effusions body wall edema.  It was  a small area of fluid collection into the left kidney a possible early developing fluid collection in the left pelvic sidewall 5.2 x 2.2 cm  His surgical drains were in good position without any change in the appearance around them.  There were no new fluid collections near those drains.  There is similar appearance of multiple intramuscular abscesses in the proximal left thigh.  In the interval one of the drains came out.  On Friday night became abruptly febrile and diaphoretic blood cultures were obtained.  We obtained a CT scan yesterday which I reviewed  This shows complex left retroperitoneal fluid collection with superior and inferior components that communicate overlying Gerota's fascia and the left psoas musculature prior seen surgical drain in the inferior aspect the collection was removed superior aspect the collection has diminished in size but the collection is similar in size.  There is also presacral air-fluid collection with a drain that is similar in size 7.9 x 2.5 cm.  I have consulted IR to place another drain and if they do I would like it sent for cultures.  Looks like Dr. Marcello Moores is also ordered a CT of the femur as well.  Continue Zosyn  I spent 25mnutes with the patient including than 50% of the time in face to face counseling of the patient on the nature of his intra-abdominal abscesses and the microbiological data personally reviewing CT abdomen pelvis along with review of medical records in preparation for the visit and during the visit and in coordination of his care.     LOS: 2 days   CAlcide Evener5/28/2023, 1:19 PM

## 2022-04-11 NOTE — Progress Notes (Signed)
Occupational Therapy Session Note  Patient Details  Name: Tyler [name] MRN: [id number] Date of Birth: 1950/12/28  Today's Date: 04/11/2022 OT Individual Time: 8242-3536 OT Individual Time Calculation (min): 49 min    Short Term Goals: Week 1:  OT Short Term Goal 1 (Week 1): Pt will complete grooming sink side level standing up to 4 minute intervals with brief intermittent rests seated as needed with close S OT Short Term Goal 2 (Week 1): Pt will complete toilet and shower bench transfers with CGA using grab bars OT Short Term Goal 3 (Week 1): Pt will complete UB bathing and dressing with close S after set up OT Short Term Goal 4 (Week 1): Pt will complete LB bathing and dressing using AE as needed with min A OT Short Term Goal 5 (Week 1): Pt will verbally identify, express and employ 3/3 energy conservation strategies during ADL's, functional mobility and light UE ther ex//act with min VC's.  Skilled Therapeutic Interventions/Progress Updates:    Patient received supine in bed, awake and eager for therapy.  Patient agreeable to getting up out of ed to get washed at sink.  Patient excited to put on clothing, however PICC line actively running, and nurse unable to disconnect to put on tshirt.  Patient transferred from bed to wheelchair with contact guard assistance - level surface.   Worked on increasing independence with basic self care skills, and building activity tolerance.  Patient left up in wheelchair - did brief demo on how to navigate wheelchair in room.  Patient with safety belt in place, and tray table with personal items in front and needed items - call bell urinal within reach.    Therapy Documentation Precautions:  Precautions Precautions: Fall Precaution Comments: drains, colostomy, rectal drainage Restrictions Weight Bearing Restrictions: No  Pain:  Reports no pain    Therapy/Group: Individual Therapy  Mariah Milling 04/11/2022, 12:34 PM

## 2022-04-12 ENCOUNTER — Inpatient Hospital Stay (HOSPITAL_COMMUNITY): Payer: BC Managed Care – PPO

## 2022-04-12 ENCOUNTER — Encounter (HOSPITAL_COMMUNITY): Payer: Self-pay | Admitting: Physical Medicine & Rehabilitation

## 2022-04-12 DIAGNOSIS — E8809 Other disorders of plasma-protein metabolism, not elsewhere classified: Secondary | ICD-10-CM

## 2022-04-12 DIAGNOSIS — T8143XA Infection following a procedure, organ and space surgical site, initial encounter: Principal | ICD-10-CM

## 2022-04-12 DIAGNOSIS — D62 Acute posthemorrhagic anemia: Secondary | ICD-10-CM

## 2022-04-12 DIAGNOSIS — A498 Other bacterial infections of unspecified site: Secondary | ICD-10-CM | POA: Diagnosis present

## 2022-04-12 DIAGNOSIS — E876 Hypokalemia: Secondary | ICD-10-CM

## 2022-04-12 DIAGNOSIS — A4181 Sepsis due to Enterococcus: Secondary | ICD-10-CM | POA: Diagnosis present

## 2022-04-12 DIAGNOSIS — K659 Peritonitis, unspecified: Secondary | ICD-10-CM

## 2022-04-12 HISTORY — DX: Other bacterial infections of unspecified site: A49.8

## 2022-04-12 HISTORY — DX: Sepsis due to Enterococcus: A41.81

## 2022-04-12 HISTORY — DX: Infection following a procedure, organ and space surgical site, initial encounter: T81.43XA

## 2022-04-12 LAB — CBC WITH DIFFERENTIAL/PLATELET
Abs Immature Granulocytes: 0 10*3/uL (ref 0.00–0.07)
Basophils Absolute: 0.1 10*3/uL (ref 0.0–0.1)
Basophils Relative: 2 %
Eosinophils Absolute: 0.1 10*3/uL (ref 0.0–0.5)
Eosinophils Relative: 1 %
HCT: 23.8 % — ABNORMAL LOW (ref 39.0–52.0)
Hemoglobin: 8 g/dL — ABNORMAL LOW (ref 13.0–17.0)
Lymphocytes Relative: 52 %
Lymphs Abs: 3.3 10*3/uL (ref 0.7–4.0)
MCH: 31 pg (ref 26.0–34.0)
MCHC: 33.6 g/dL (ref 30.0–36.0)
MCV: 92.2 fL (ref 80.0–100.0)
Monocytes Absolute: 0.6 10*3/uL (ref 0.1–1.0)
Monocytes Relative: 10 %
Neutro Abs: 2.2 10*3/uL (ref 1.7–7.7)
Neutrophils Relative %: 35 %
Platelets: 310 10*3/uL (ref 150–400)
RBC: 2.58 MIL/uL — ABNORMAL LOW (ref 4.22–5.81)
RDW: 14.5 % (ref 11.5–15.5)
WBC: 6.3 10*3/uL (ref 4.0–10.5)
nRBC: 0 % (ref 0.0–0.2)
nRBC: 1 /100 WBC — ABNORMAL HIGH

## 2022-04-12 LAB — COMPREHENSIVE METABOLIC PANEL
ALT: 28 U/L (ref 0–44)
AST: 20 U/L (ref 15–41)
Albumin: 1.9 g/dL — ABNORMAL LOW (ref 3.5–5.0)
Alkaline Phosphatase: 309 U/L — ABNORMAL HIGH (ref 38–126)
Anion gap: 8 (ref 5–15)
BUN: 8 mg/dL (ref 8–23)
CO2: 25 mmol/L (ref 22–32)
Calcium: 8.2 mg/dL — ABNORMAL LOW (ref 8.9–10.3)
Chloride: 99 mmol/L (ref 98–111)
Creatinine, Ser: 1 mg/dL (ref 0.61–1.24)
GFR, Estimated: >60 mL/min (ref >60)
Glucose, Bld: 98 mg/dL (ref 70–99)
Potassium: 3.4 mmol/L — ABNORMAL LOW (ref 3.5–5.1)
Sodium: 132 mmol/L — ABNORMAL LOW (ref 135–145)
Total Bilirubin: 0.4 mg/dL (ref 0.3–1.2)
Total Protein: 6.3 g/dL — ABNORMAL LOW (ref 6.5–8.1)

## 2022-04-12 LAB — PROTIME-INR
INR: 1.2 (ref 0.8–1.2)
Prothrombin Time: 15.2 seconds (ref 11.4–15.2)

## 2022-04-12 MED ORDER — SODIUM CHLORIDE 0.9% FLUSH
10.0000 mL | INTRAVENOUS | Status: DC | PRN
  Administered 2022-04-22 – 2022-04-28 (×3): 10 mL

## 2022-04-12 MED ORDER — FENTANYL CITRATE (PF) 100 MCG/2ML IJ SOLN
INTRAMUSCULAR | Status: AC | PRN
  Administered 2022-04-12 (×2): 50 ug via INTRAVENOUS

## 2022-04-12 MED ORDER — MIDAZOLAM HCL 2 MG/2ML IJ SOLN
INTRAMUSCULAR | Status: AC | PRN
  Administered 2022-04-12 (×2): 1 mg via INTRAVENOUS

## 2022-04-12 MED ORDER — FENTANYL CITRATE (PF) 100 MCG/2ML IJ SOLN
INTRAMUSCULAR | Status: AC
  Filled 2022-04-12: qty 2

## 2022-04-12 MED ORDER — MIDAZOLAM HCL 2 MG/2ML IJ SOLN
INTRAMUSCULAR | Status: AC
  Filled 2022-04-12: qty 4

## 2022-04-12 MED ORDER — POTASSIUM CHLORIDE CRYS ER 20 MEQ PO TBCR
20.0000 meq | EXTENDED_RELEASE_TABLET | Freq: Two times a day (BID) | ORAL | Status: AC
  Administered 2022-04-12 (×2): 20 meq via ORAL
  Filled 2022-04-12 (×2): qty 1

## 2022-04-12 MED ORDER — SODIUM CHLORIDE 0.9% FLUSH
10.0000 mL | Freq: Two times a day (BID) | INTRAVENOUS | Status: DC
  Administered 2022-04-12 – 2022-04-28 (×27): 10 mL

## 2022-04-12 MED ORDER — LIDOCAINE HCL 1 % IJ SOLN
INTRAMUSCULAR | Status: AC
  Filled 2022-04-12: qty 10

## 2022-04-12 NOTE — Progress Notes (Signed)
Occupational Therapy Session Note  Patient Details  Name: Tyler [name] MRN: [id number] Date of Birth: 1951/01/17  Today's Date: 04/12/2022 OT Individual Time: [phone] and 1440-1520 OT Individual Time Calculation (min): 50 min  and 40 min  Today's Date: 04/12/2022 OT Missed Time: 10 Minutes and 35 min (pt too fatigued) Missed Time Reason: Unavailable (comment) (pt was taken to a procedure)   Short Term Goals: Week 1:  OT Short Term Goal 1 (Week 1): Pt will complete grooming sink side level standing up to 4 minute intervals with brief intermittent rests seated as needed with close S OT Short Term Goal 2 (Week 1): Pt will complete toilet and shower bench transfers with CGA using grab bars OT Short Term Goal 3 (Week 1): Pt will complete UB bathing and dressing with close S after set up OT Short Term Goal 4 (Week 1): Pt will complete LB bathing and dressing using AE as needed with min A OT Short Term Goal 5 (Week 1): Pt will verbally identify, express and employ 3/3 energy conservation strategies during ADL's, functional mobility and light UE ther ex//act with min VC's.  Skilled Therapeutic Interventions/Progress Updates:    Visit 1: Pain: mild c/o pain in R calf Pt received in bed with his partner present. Briefly reviewed his home set up and discussed bed height.  Pt was able to sit up to EOB independently.  From EOB, he worked on LE AROM using washcloths on the floor for gliding exercises.  He then used gait belt as a strap for calf stretches followed by active resistance against belt strap for calf strengthening. His back began to hurt from sitting up unsupported so transferred stand pivot to wc with close S.  Pt sat for just a few minutes and then realized he was really feeling weak and tired (he has also been fasting from food and water to prepare for a procedure).  Supervision stand pivot back to bed and back to supine. Pt used urinal with set up.  With HOB elevated, pt worked on  AROM exercises for arms and ankles.  Transport arrived to take pt to his procedure.      Visit 2: Pain: c/o muscular fatigue and shaking  Pt received in bed after waking up from a long nap. Pt stated he was still very tired but willing to try to participate. Pt sat up slowly to EOB with supervision.  Pt set up to practice sit to stands with RW.  Pt began to push up with his hands to start to stand but did not have enough energy to do so.  "I am so sorry, but I have NO strength or energy right now, I just can't".  Pt moved back to supine.  He worked on UE strength with 2# dowel bar doing chest press, sh flexion and tricep extensions.  He often felt shakey and kept saying how weak he feels.  Spent time discussing how to increase his nutrional and liquid intake and recommended he talk with the dietician.   Pt encouraged to rest to prepare himself for therapy tomorrow.    Pt resting in bed with all needs met.      Therapy Documentation Precautions:  Precautions Precautions: Fall Precaution Comments: drains, colostomy, rectal drainage Restrictions Weight Bearing Restrictions: No    Vital Signs: Therapy Vitals Temp: 99.1 F (37.3 C) Temp Source: Oral Pulse Rate: 86 Resp: 18 BP: 135/85 Patient Position (if appropriate): Lying Oxygen Therapy SpO2: 97 % O2 Device: Room Air  Pain: Pain Assessment Pain Scale: 0-10 Pain Score: 0-No pain ADL: ADL Eating: Set up Where Assessed-Eating: Chair Grooming: Minimal assistance Where Assessed-Grooming: Edge of bed Upper Body Bathing: Minimal assistance Where Assessed-Upper Body Bathing: Edge of bed Lower Body Bathing: Maximal assistance Where Assessed-Lower Body Bathing: Edge of bed Upper Body Dressing: Moderate assistance Where Assessed-Upper Body Dressing: Edge of bed Lower Body Dressing: Maximal assistance Where Assessed-Lower Body Dressing: Edge of bed Toileting: Maximal assistance (urinal use with min a and Dep for colostomy  management) Toilet Transfer: Not assessed (pt transferred bed t recliner via walker. Used urinal wiht OT and has colostomy for bowl mngt currently) Tub/Shower Transfer: Not assessed Social research officer, government: Not assessed (will assess next session due to pt fearing lightheadedness) ADL Comments: Pt feared episode of lightheadedness and drop in BP as per last therapy visit and had been in hospital bed x 2 days. OT worked with pt bed level, EOB and then transferred via RW SPT taking 4 steps for ADL routine assessment, pt using urinal for voiding and has colostomy for bowel mngt, VSS throughout session, difficulty with LE reach and overall activity tolerance requiring frequenct rests and cues for pacing and breathing   Therapy/Group: Individual Therapy  [place] 04/12/2022, 8:22 AM

## 2022-04-12 NOTE — Progress Notes (Signed)
Subjective:   Feels much better and was happy that IR were able to get all of the pus from the left sided abscess drained  Antibiotics:  Anti-infectives (From admission, onward)    Start     Dose/Rate Route Frequency Ordered Stop   04/10/22 1000  vancomycin (VANCOCIN) IVPB 1000 mg/200 mL premix  Status:  Discontinued        1,000 mg 200 mL/hr over 60 Minutes Intravenous Every 12 hours 04/09/22 2211 04/09/22 2228   04/09/22 2300  vancomycin (VANCOREADY) IVPB 1500 mg/300 mL  Status:  Discontinued        1,500 mg 150 mL/hr over 120 Minutes Intravenous  Once 04/09/22 2211 04/09/22 2228   04/09/22 1700  piperacillin-tazobactam (ZOSYN) IVPB 3.375 g        3.375 g 12.5 mL/hr over 240 Minutes Intravenous Every 8 hours 04/09/22 1340 05/11/22 2359   04/09/22 1430  micafungin (MYCAMINE) 100 mg in sodium chloride 0.9 % 100 mL IVPB        100 mg 105 mL/hr over 1 Hours Intravenous Every 24 hours 04/09/22 1340 05/11/22 2359       Medications: Scheduled Meds:  (feeding supplement) PROSource Plus  30 mL Oral BID BM   vitamin C  500 mg Oral BID   bisoprolol  10 mg Oral QHS   Chlorhexidine Gluconate Cloth  6 each Topical Daily   enoxaparin (LOVENOX) injection  40 mg Subcutaneous Q24H   famotidine  20 mg Oral BID   feeding supplement  237 mL Oral TID BM   fentaNYL       ferrous sulfate  325 mg Oral BID WC   lidocaine       lip balm  1 application. Topical BID   loperamide  2 mg Oral TID with meals   methocarbamol  1,000 mg Oral TID   midazolam       montelukast  10 mg Oral QHS   multivitamin with minerals  1 tablet Oral Daily   pantoprazole  40 mg Oral Daily   polycarbophil  625 mg Oral BID   potassium chloride  20 mEq Oral BID   sodium chloride flush  10-40 mL Intracatheter Q12H   Continuous Infusions:  micafungin (MYCAMINE) IV 100 mg (04/11/22 1346)   ondansetron (ZOFRAN) IV     piperacillin-tazobactam (ZOSYN)  IV 3.375 g (04/12/22 0818)   PRN Meds:.acetaminophen,  albuterol, alum & mag hydroxide-simeth, azelastine, ibuprofen, LORazepam, ondansetron (ZOFRAN) IV **OR** ondansetron (ZOFRAN) IV, ondansetron, simethicone, sodium chloride flush, traMADol, traZODone    Objective: Weight change:   Intake/Output Summary (Last 24 hours) at 04/12/2022 1154 Last data filed at 04/12/2022 0920 Gross per 24 hour  Intake 236 ml  Output 1530 ml  Net -1294 ml    Blood pressure 115/83, pulse 96, temperature 99.1 F (37.3 C), temperature source Oral, resp. rate 20, height '5\' 9"'$  (1.753 m), weight 75.1 kg, SpO2 99 %. Temp:  [98.7 F (37.1 C)-99.8 F (37.7 C)] 99.1 F (37.3 C) (05/29 0536) Pulse Rate:  [86-101] 96 (05/29 1005) Resp:  [16-20] 20 (05/29 1005) BP: (115-135)/(73-87) 115/83 (05/29 1005) SpO2:  [97 %-100 %] 99 % (05/29 1005)  Physical Exam: Physical Exam Constitutional:      Appearance: He is well-developed.  HENT:     Head: Normocephalic and atraumatic.  Eyes:     Conjunctiva/sclera: Conjunctivae normal.  Cardiovascular:     Rate and Rhythm: Normal rate and regular rhythm.  Pulmonary:  Effort: Pulmonary effort is normal. No respiratory distress.     Breath sounds: Normal breath sounds. No stridor. No wheezing.  Abdominal:     General: There is no distension.     Palpations: Abdomen is soft.  Musculoskeletal:        General: Normal range of motion.     Cervical back: Normal range of motion and neck supple.  Skin:    General: Skin is warm and dry.     Findings: No erythema or rash.  Neurological:     General: No focal deficit present.     Mental Status: He is alert and oriented to person, place, and time.  Psychiatric:        Mood and Affect: Mood normal.        Behavior: Behavior normal.        Thought Content: Thought content normal.        Judgment: Judgment normal.    Drains with purulent material present   CBC:    BMET Recent Labs    04/10/22 0250 04/12/22 0644  NA 133* 132*  K 3.4* 3.4*  CL 100 99  CO2 24 25   GLUCOSE 101* 98  BUN 12 8  CREATININE 0.92 1.00  CALCIUM 8.3* 8.2*      Liver Panel  Recent Labs    04/12/22 0644  PROT 6.3*  ALBUMIN 1.9*  AST 20  ALT 28  ALKPHOS 309*  BILITOT 0.4        Sedimentation Rate No results for input(s): ESRSEDRATE in the last 72 hours. C-Reactive Protein No results for input(s): CRP in the last 72 hours.  Micro Results: Recent Results (from the past 720 hour(s))  Surgical PCR screen     Status: None   Collection Time: 03/15/22 12:00 PM   Specimen: Nasal Mucosa; Nasal Swab  Result Value Ref Range Status   MRSA, PCR NEGATIVE NEGATIVE Final   Staphylococcus aureus NEGATIVE NEGATIVE Final    Comment: (NOTE) The Xpert SA Assay (FDA approved for NASAL specimens in patients 4 years of age and older), is one component of a comprehensive surveillance program. It is not intended to diagnose infection nor to guide or monitor treatment. Performed at [facility], [place] [address], [place], [place] [address]   MRSA Next Gen by PCR, Nasal     Status: None   Collection Time: 03/17/22  3:46 PM   Specimen: Nasal Mucosa; Nasal Swab  Result Value Ref Range Status   MRSA by PCR Next Gen NOT DETECTED NOT DETECTED Final    Comment: (NOTE) The GeneXpert MRSA Assay (FDA approved for NASAL specimens only), is one component of a comprehensive MRSA colonization surveillance program. It is not intended to diagnose MRSA infection nor to guide or monitor treatment for MRSA infections. Test performance is not FDA approved in patients less than 36 years old. Performed at [facility], [place] [address] [place], [place] [address]   Culture, blood (Routine X 2) w Reflex to ID Panel     Status: None   Collection Time: 03/17/22  4:11 PM   Specimen: BLOOD  Result Value Ref Range Status   Specimen Description   Final    BLOOD BLOOD LEFT ARM Performed at [facility] [address] [place], [place]  [address]    Special Requests   Final    BOTTLES DRAWN AEROBIC ONLY Blood Culture results may not be optimal due to an inadequate volume of blood received  in culture bottles Performed at [facility], [place] [address] [place], [place] [address]    Culture   Final    NO GROWTH 5 DAYS Performed at [facility], [place] [address] [place], [place] [address]    Report Status 03/22/2022 FINAL  Final  Culture, blood (Routine X 2) w Reflex to ID Panel     Status: None   Collection Time: 03/17/22  4:11 PM   Specimen: BLOOD  Result Value Ref Range Status   Specimen Description   Final    BLOOD BLOOD LEFT HAND Performed at [facility] [address], [place], [place] [address]    Special Requests   Final    BOTTLES DRAWN AEROBIC ONLY Blood Culture adequate volume Performed at [facility] [address] [place], [place] [address]    Culture   Final    NO GROWTH 5 DAYS Performed at [facility], [place] [address], [place], [place] [address]    Report Status 03/22/2022 FINAL  Final  Aerobic/Anaerobic Culture w Gram Stain (surgical/deep wound)     Status: None   Collection Time: 03/20/22  8:44 AM   Specimen: PATH Soft tissue  Result Value Ref Range Status   Specimen Description   Final    TISSUE PATH SOFT TISSUE Performed at [facility] [address], [place], [place] [address]    Special Requests   Final    TISSUE Performed at [facility], [place] [address], [place], [place] [address]    Gram Stain   Final    NO SQUAMOUS EPITHELIAL CELLS SEEN MODERATE WBC SEEN ABUNDANT GRAM POSITIVE COCCI Performed at [facility], [place] [address] [place], [place] [address]    Culture   Final    FEW ESCHERICHIA COLI FEW ENTEROCOCCUS FAECALIS MIXED ANAEROBIC FLORA PRESENT.  CALL LAB IF FURTHER IID REQUIRED.    Report Status 03/25/2022 FINAL  Final   Organism ID, Bacteria ESCHERICHIA COLI   Final   Organism ID, Bacteria ENTEROCOCCUS FAECALIS  Final      Susceptibility   Escherichia coli - MIC*    AMPICILLIN >=32 RESISTANT Resistant     CEFAZOLIN <=4 SENSITIVE Sensitive     CEFEPIME <=0.12 SENSITIVE Sensitive     CEFTAZIDIME <=1 SENSITIVE Sensitive     CEFTRIAXONE <=0.25 SENSITIVE Sensitive     CIPROFLOXACIN <=0.25 SENSITIVE Sensitive     GENTAMICIN <=1 SENSITIVE Sensitive     IMIPENEM <=0.25 SENSITIVE Sensitive     TRIMETH/SULFA <=20 SENSITIVE Sensitive     AMPICILLIN/SULBACTAM 16 INTERMEDIATE Intermediate     PIP/TAZO <=4 SENSITIVE Sensitive     * FEW ESCHERICHIA COLI   Enterococcus faecalis - MIC*    AMPICILLIN <=2 SENSITIVE Sensitive     VANCOMYCIN 1 SENSITIVE Sensitive     GENTAMICIN SYNERGY SENSITIVE Sensitive     * FEW ENTEROCOCCUS FAECALIS  Culture, blood (Routine X 2) w Reflex to ID Panel     Status: None   Collection Time: 03/25/22 10:30 AM   Specimen: BLOOD  Result Value Ref Range Status   Specimen Description   Final    BLOOD LEFT ANTECUBITAL Performed at [facility] [address], [place], [place] [address]    Special Requests   Final    IN PEDIATRIC BOTTLE Blood Culture adequate volume Performed at [facility] [address], [place], [place] [address]    Culture  Final    NO GROWTH 5 DAYS Performed at [facility], [place] [address], [place], [place] [address]    Report Status 03/30/2022 FINAL  Final  Culture, blood (Routine X 2) w Reflex to ID Panel     Status: None   Collection Time: 03/25/22 10:37 AM   Specimen: BLOOD LEFT HAND  Result Value Ref Range Status   Specimen Description   Final    BLOOD LEFT HAND Performed at [facility] [address] [place], [place] [address]    Special Requests   Final    IN PEDIATRIC BOTTLE Blood Culture adequate volume Performed at [facility] [address] [place], [place] [address]    Culture   Final    NO  GROWTH 5 DAYS Performed at [facility], [place] [address] [place], [place] [address]    Report Status 03/30/2022 FINAL  Final  Aerobic/Anaerobic Culture w Gram Stain (surgical/deep wound)     Status: None   Collection Time: 03/30/22  5:00 PM   Specimen: Abscess  Result Value Ref Range Status   Specimen Description   Final    ABSCESS Performed at [facility] [address], [place], [place] [address]    Special Requests LEFT LEG  Final   Gram Stain   Final    ABUNDANT WBC PRESENT, PREDOMINANTLY PMN NO ORGANISMS SEEN    Culture   Final    RARE CANDIDA ALBICANS NO ANAEROBES ISOLATED Performed at [facility], [place] [address] [place], [place] [address]    Report Status 04/04/2022 FINAL  Final  Respiratory (~20 pathogens) panel by PCR     Status: None   Collection Time: 04/09/22 11:00 PM   Specimen: Nasopharyngeal Swab; Respiratory  Result Value Ref Range Status   Adenovirus NOT DETECTED NOT DETECTED Final   Coronavirus 229E NOT DETECTED NOT DETECTED Final    Comment: (NOTE) The Coronavirus on the Respiratory Panel, DOES NOT test for the novel  Coronavirus (2019 nCoV)    Coronavirus HKU1 NOT DETECTED NOT DETECTED Final   Coronavirus NL63 NOT DETECTED NOT DETECTED Final   Coronavirus OC43 NOT DETECTED NOT DETECTED Final   Metapneumovirus NOT DETECTED NOT DETECTED Final   Rhinovirus / Enterovirus NOT DETECTED NOT DETECTED Final   Influenza A NOT DETECTED NOT DETECTED Final   Influenza B NOT DETECTED NOT DETECTED Final   Parainfluenza Virus 1 NOT DETECTED NOT DETECTED Final   Parainfluenza Virus 2 NOT DETECTED NOT DETECTED Final   Parainfluenza Virus 3 NOT DETECTED NOT DETECTED Final   Parainfluenza Virus 4 NOT DETECTED NOT DETECTED Final   Respiratory Syncytial Virus NOT DETECTED NOT DETECTED Final   Bordetella pertussis NOT DETECTED NOT DETECTED Final   Bordetella Parapertussis NOT DETECTED NOT DETECTED Final   Chlamydophila pneumoniae NOT  DETECTED NOT DETECTED Final   Mycoplasma pneumoniae NOT DETECTED NOT DETECTED Final    Comment: Performed at [facility], [place]. [address], [place], [place] [address]  SARS Coronavirus 2 by RT PCR (hospital order, performed in [facility] hospital lab) *cepheid single result test*     Status: None   Collection Time: 04/09/22 11:00 PM  Result Value Ref Range Status   SARS Coronavirus 2 by RT PCR NEGATIVE NEGATIVE Final    Comment: (NOTE) SARS-CoV-2 target nucleic acids are NOT DETECTED.  The SARS-CoV-2 RNA is generally detectable in upper and lower respiratory specimens during the acute phase of infection. The  lowest concentration of SARS-CoV-2 viral copies this assay can detect is 250 copies / mL. A negative result does not preclude SARS-CoV-2 infection and should not be used as the sole basis for treatment or other patient management decisions.  A negative result may occur with improper specimen collection / handling, submission of specimen other than nasopharyngeal swab, presence of viral mutation(s) within the areas targeted by this assay, and inadequate number of viral copies (<250 copies / mL). A negative result must be combined with clinical observations, patient history, and epidemiological information.  Fact Sheet for Patients:   https://www.patel.info/  Fact Sheet for Healthcare Providers: https://hall.com/  This test is not yet approved or  cleared by the [place] FDA and has been authorized for detection and/or diagnosis of SARS-CoV-2 by FDA under an Emergency Use Authorization (EUA).  This EUA will remain in effect (meaning this test can be used) for the duration of the COVID-19 declaration under Section 564(b)(1) of the Act, 21 U.S.C. section 360bbb-3(b)(1), unless the authorization is terminated or revoked sooner.  Performed at [facility], [place] [address] [place], [place] [address]   Culture, blood  (Routine X 2) w Reflex to ID Panel     Status: None (Preliminary result)   Collection Time: 04/10/22  2:50 AM   Specimen: BLOOD LEFT FOREARM  Result Value Ref Range Status   Specimen Description BLOOD LEFT FOREARM  Final   Special Requests   Final    BOTTLES DRAWN AEROBIC AND ANAEROBIC Blood Culture results may not be optimal due to an excessive volume of blood received in culture bottles   Culture   Final    NO GROWTH 2 DAYS Performed at [facility], [place] [address] [place], [place] [address]    Report Status PENDING  Incomplete  Culture, blood (Routine X 2) w Reflex to ID Panel     Status: None (Preliminary result)   Collection Time: 04/10/22  2:50 AM   Specimen: BLOOD LEFT HAND  Result Value Ref Range Status   Specimen Description BLOOD LEFT HAND  Final   Special Requests   Final    BOTTLES DRAWN AEROBIC AND ANAEROBIC Blood Culture results may not be optimal due to an excessive volume of blood received in culture bottles   Culture   Final    NO GROWTH 2 DAYS Performed at [facility], [place] [address] [place], [place] [address]    Report Status PENDING  Incomplete    Studies/Results: CT ABDOMEN PELVIS W CONTRAST  Result Date: 04/10/2022 CLINICAL DATA:  Intra-abdominal abscess EXAM: CT ABDOMEN AND PELVIS WITH CONTRAST TECHNIQUE: Multidetector CT imaging of the abdomen and pelvis was performed using the standard protocol following bolus administration of intravenous contrast. RADIATION DOSE REDUCTION: This exam was performed according to the departmental dose-optimization program which includes automated exposure control, adjustment of the mA and/or kV according to patient size and/or use of iterative reconstruction technique. CONTRAST:  11m OMNIPAQUE IOHEXOL 300 MG/ML  SOLN COMPARISON:  04/05/2021 FINDINGS: Lower chest: Small left pleural effusion and associated atelectasis or consolidation, similar to prior examination. Coronary artery disease. Hepatobiliary: No solid  liver abnormality is seen. No gallstones, gallbladder wall thickening, or biliary dilatation. Pancreas: Unremarkable. No pancreatic ductal dilatation or surrounding inflammatory changes. Spleen: Normal in size without significant abnormality. Adrenals/Urinary Tract: Adrenal glands are unremarkable. Kidneys are normal, without renal calculi, solid lesion, or hydronephrosis. Bladder is unremarkable. Stomach/Bowel: Stomach is within normal limits. Appendix is surgically absent. Left lower  quadrant diverting colostomy. Vascular/Lymphatic: Aortic atherosclerosis. No enlarged abdominal or pelvic lymph nodes. Reproductive: No mass or other significant abnormality. Other: Left lower quadrant colostomy. There is a complex left retroperitoneal fluid collection, with components that appear to communicate overlying the Gerota's fascia and left psoas musculature (series 3, image 39, 59). A previously seen surgical drain within the inferior aspect of this collection has been removed. The superior aspect of the collection is diminished in size, measuring 3.2 x 2.3 cm, previously 4.4 x 2.8 cm (series 3, image 37). Inferior aspect of the collection is similar, measuring 4.7 x 1.6 cm (series 3, image 59). Additional presacral air and fluid collection contains 1 surgical drain, others removed and is similar in size, measuring 7.9 x 2.5 cm (series 3, image 75). Anasarca. Musculoskeletal: No acute or significant osseous findings. IMPRESSION: 1. There is a complex left retroperitoneal fluid collection, with superior and inferior components that appear to communicate overlying the Gerota's fascia and left psoas musculature. A previously seen surgical drain within the inferior aspect of this collection has been removed. The superior aspect of the collection is diminished in size, measuring 3.2 x 2.3 cm, previously 4.4 x 2.8 cm. Inferior aspect of the collection is similar, measuring 4.7 x 1.6 cm. 2. Additional presacral air and fluid  collection contains one surgical drain, others removed and is similar in size, measuring 7.9 x 2.5 cm. 3. Left lower quadrant diverting colostomy. 4. Small left pleural effusion and associated atelectasis or consolidation, similar to prior examination. 5. Coronary artery disease. Aortic Atherosclerosis (ICD10-I70.0). Electronically Signed   By: Delanna Ahmadi M.D.   On: 04/10/2022 16:11   CT ASPIRATION  Result Date: 04/12/2022 INDICATION: Abdominal abscess EXAM: CT-guided aspiration of abdominal abscess TECHNIQUE: Multidetector CT imaging of the abdomen was performed following the standard protocol without IV contrast. RADIATION DOSE REDUCTION: This exam was performed according to the departmental dose-optimization program which includes automated exposure control, adjustment of the mA and/or kV according to patient size and/or use of iterative reconstruction technique. MEDICATIONS: The patient is currently admitted to the hospital and receiving intravenous antibiotics. The antibiotics were administered within an appropriate time frame prior to the initiation of the procedure. ANESTHESIA/SEDATION: Moderate (conscious) sedation was employed during this procedure. A total of Versed 2 mg and Fentanyl 100 mcg was administered intravenously by the radiology nurse. Total intra-service moderate Sedation Time: 10 minutes. The patient's level of consciousness and vital signs were monitored continuously by radiology nursing throughout the procedure under my direct supervision. COMPLICATIONS: None immediate. PROCEDURE: Informed written consent was obtained from the patient after a thorough discussion of the procedural risks, benefits and alternatives. All questions were addressed. Maximal Sterile Barrier Technique was utilized including caps, mask, sterile gowns, sterile gloves, sterile drape, hand hygiene and skin antiseptic. A timeout was performed prior to the initiation of the procedure. The patient was placed supine on  the exam table. Limited CT of the abdomen was performed for planning purposes. This again demonstrated a small collection in the left lower quadrant. Skin entry site was marked, the overlying skin was prepped and draped in a standard sterile fashion. Local analgesia was obtained with 1% lidocaine. Using intermittent CT fluoroscopy, a 19 gauge Yueh catheter was advanced towards the identified left lower quadrant collection. Location was confirmed with CT and return of purulent material. Primary aspiration was performed as the collection was felt to be too small for safe drain placement. A total of 15 mL of purulent material was aspirated, a sample  was sent to the lab for analysis. Postprocedure CT demonstrated no hematoma common appropriate decompression of the abscess cavity. A clean dressing was placed. The patient tolerated the procedure well without immediate complication. IMPRESSION: Successful CT-guided aspiration of left lower quadrant abscess, with removal of 15 mL of purulent material. Sample sent to the lab for microbiology analysis. Electronically Signed   By: Albin Felling M.D.   On: 04/12/2022 10:43      Assessment/Plan:  INTERVAL HISTORY:   Purulent material was drained from left side by interventional radiology  Principal Problem:   Abscess, intra-abdominal, postoperative Active Problems:   Debility   Sepsis due to Enterococcus ([place])   E coli infection    Tyler [name] is a 71 y.o. male who had undergone transanal hemorrhoidal dearterialization in April 21 with general surgery.  Unfortunately he then developed nausea abdominal pain and fevers.  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  Repeat CT in May 5 was performed due to ongoing sepsis physiology and showed significant retroperitoneal contamination and was taken back to the operating room again on May 6  and had creation of a diverting colostomy and placement of drain x3 culture the OR grew E. coli Enterococcus faecalis and anaerobes.  Follow-up CT on May 12 that showed edema inflammation in the soft tissue and gas in the pelvic floor in the presacral space with surgical drains in place he had 5.3 x 3.5 x 7.2 cm fluid collection left upper quadrant as well is intramuscular fluid and edema.  IR was consulted for possible drainage of left upper quadrant fluid collection however at the time was deemed too risky to pursue this.  He has been on Zosyn which is active against all the organisms that have been isolated so far.  Repeat CT of the abdomen pelvis on May 22nd that showed improvement in bilateral pleural effusions body wall edema.  It was a small area of fluid collection into the left kidney a possible early developing fluid collection in the left pelvic sidewall 5.2 x 2.2 cm  His surgical drains were in good position without any change in the appearance around them.  There were no new fluid collections near those drains.  There is similar appearance of multiple intramuscular abscesses in the proximal left thigh.  In the interval one of the drains came out.  On Friday night became abruptly febrile and diaphoretic blood cultures were obtained.  We obtained a CT scan Saturday which I reviewed  This shows complex left retroperitoneal fluid collection with superior and inferior components that communicate overlying Gerota's fascia and the left psoas musculature prior seen surgical drain in the inferior aspect the collection was removed superior aspect the collection has diminished in size but the collection is similar in size.  There is also presacral air-fluid collection with a drain that is similar in size 7.9 x 2.5 cm.  Interventional radiology were able to aspirate 15 mL of purulent material which been sent to the lab.   Looks like Dr. Marcello Moores is also ordered a CT of the femur as  well.  Continue Zosyn  When he gets finished with inpatient rehab provided we not discover any new bacteria we can contemplate either continue with Zosyn which would cover all of the pathogens we know about here plus anaerobes or 1 could consider switching him to oral Bactrim and Augmentin  Dr Candiss Norse or Dr. Juleen China will see tomorrow.       LOS: 3 days   Tyler [name] 04/12/2022, 11:54 AM

## 2022-04-12 NOTE — Progress Notes (Signed)
Patient ID: Tyler [name], male   DOB: 1951/03/23, 71 y.o.   MRN: [id number] Met with the patient and partner to review rehab process, team conference and plan of care. Discussed pain control, ostomy care, drain management, and IV abx through ~6/27. Patient reports he is hungry; discussed dietary modifications to avoid gas, and manage stool consistency. Currently stool is loose and on several meds to  bulk up. Also awaiting CT to determine if another drain is needed vs aspiration of collection of fluid. Patient noted pain is managed. Patient given information on ostomy supplies for home and patient and partner need more practice with the ostomy management. Continue to follow along to discharge to address education educational needs to facilitate preparation for discharge. Hervey Ard, Dalbert Batman

## 2022-04-12 NOTE — Progress Notes (Signed)
Physical Therapy Note  Patient Details  Name: Tyler [name] MRN: [id number] Date of Birth: [birth year] Today's Date: 04/12/2022    Pt off floor for medical procedure, drain being placed.  Pt missed 95' of PT time.     Ladoris Gene 04/12/2022, 12:16 PM

## 2022-04-12 NOTE — Progress Notes (Signed)
PROGRESS NOTE   Subjective/Complaints:   He just had CT aspiration of intraabdominal fluid, no drain placed. He reports he feels well overall. No additional concerns or complains. His partner is in the room.    Review of Systems  Constitutional:  Positive for fever.       Fever and diaphoresis overnight 04/09/22-04/10/22 have resolved.  HENT:  Negative for congestion and sinus pain.   Eyes: Negative.   Respiratory:  Positive for cough. Negative for wheezing.        Cough and shortness of breath overnight 04/09/22-04/10/22 have resolved.  Cardiovascular:  Negative for chest pain and palpitations.  Gastrointestinal:  Negative for constipation, diarrhea, nausea and vomiting.  Genitourinary:  Positive for urgency. Negative for dysuria.  Musculoskeletal:  Positive for joint pain and myalgias.  Neurological:  Negative for dizziness, focal weakness, weakness and headaches.  Endo/Heme/Allergies: Negative.   Psychiatric/Behavioral:  Negative for depression. The patient is nervous/anxious. The patient does not have insomnia.     Objective:   CT ABDOMEN PELVIS W CONTRAST  Result Date: 04/10/2022 CLINICAL DATA:  Intra-abdominal abscess EXAM: CT ABDOMEN AND PELVIS WITH CONTRAST TECHNIQUE: Multidetector CT imaging of the abdomen and pelvis was performed using the standard protocol following bolus administration of intravenous contrast. RADIATION DOSE REDUCTION: This exam was performed according to the departmental dose-optimization program which includes automated exposure control, adjustment of the mA and/or kV according to patient size and/or use of iterative reconstruction technique. CONTRAST:  1104m OMNIPAQUE IOHEXOL 300 MG/ML  SOLN COMPARISON:  04/05/2021 FINDINGS: Lower chest: Small left pleural effusion and associated atelectasis or consolidation, similar to prior examination. Coronary artery disease. Hepatobiliary: No solid liver abnormality is  seen. No gallstones, gallbladder wall thickening, or biliary dilatation. Pancreas: Unremarkable. No pancreatic ductal dilatation or surrounding inflammatory changes. Spleen: Normal in size without significant abnormality. Adrenals/Urinary Tract: Adrenal glands are unremarkable. Kidneys are normal, without renal calculi, solid lesion, or hydronephrosis. Bladder is unremarkable. Stomach/Bowel: Stomach is within normal limits. Appendix is surgically absent. Left lower quadrant diverting colostomy. Vascular/Lymphatic: Aortic atherosclerosis. No enlarged abdominal or pelvic lymph nodes. Reproductive: No mass or other significant abnormality. Other: Left lower quadrant colostomy. There is a complex left retroperitoneal fluid collection, with components that appear to communicate overlying the Gerota's fascia and left psoas musculature (series 3, image 39, 59). A previously seen surgical drain within the inferior aspect of this collection has been removed. The superior aspect of the collection is diminished in size, measuring 3.2 x 2.3 cm, previously 4.4 x 2.8 cm (series 3, image 37). Inferior aspect of the collection is similar, measuring 4.7 x 1.6 cm (series 3, image 59). Additional presacral air and fluid collection contains 1 surgical drain, others removed and is similar in size, measuring 7.9 x 2.5 cm (series 3, image 75). Anasarca. Musculoskeletal: No acute or significant osseous findings. IMPRESSION: 1. There is a complex left retroperitoneal fluid collection, with superior and inferior components that appear to communicate overlying the Gerota's fascia and left psoas musculature. A previously seen surgical drain within the inferior aspect of this collection has been removed. The superior aspect of the collection is diminished in size, measuring 3.2 x 2.3 cm, previously 4.4  x 2.8 cm. Inferior aspect of the collection is similar, measuring 4.7 x 1.6 cm. 2. Additional presacral air and fluid collection contains one  surgical drain, others removed and is similar in size, measuring 7.9 x 2.5 cm. 3. Left lower quadrant diverting colostomy. 4. Small left pleural effusion and associated atelectasis or consolidation, similar to prior examination. 5. Coronary artery disease. Aortic Atherosclerosis (ICD10-I70.0). Electronically Signed   By: Delanna Ahmadi M.D.   On: 04/10/2022 16:11   CT ASPIRATION  Result Date: 04/12/2022 INDICATION: Abdominal abscess EXAM: CT-guided aspiration of abdominal abscess TECHNIQUE: Multidetector CT imaging of the abdomen was performed following the standard protocol without IV contrast. RADIATION DOSE REDUCTION: This exam was performed according to the departmental dose-optimization program which includes automated exposure control, adjustment of the mA and/or kV according to patient size and/or use of iterative reconstruction technique. MEDICATIONS: The patient is currently admitted to the hospital and receiving intravenous antibiotics. The antibiotics were administered within an appropriate time frame prior to the initiation of the procedure. ANESTHESIA/SEDATION: Moderate (conscious) sedation was employed during this procedure. A total of Versed 2 mg and Fentanyl 100 mcg was administered intravenously by the radiology nurse. Total intra-service moderate Sedation Time: 10 minutes. The patient's level of consciousness and vital signs were monitored continuously by radiology nursing throughout the procedure under my direct supervision. COMPLICATIONS: None immediate. PROCEDURE: Informed written consent was obtained from the patient after a thorough discussion of the procedural risks, benefits and alternatives. All questions were addressed. Maximal Sterile Barrier Technique was utilized including caps, mask, sterile gowns, sterile gloves, sterile drape, hand hygiene and skin antiseptic. A timeout was performed prior to the initiation of the procedure. The patient was placed supine on the exam table. Limited  CT of the abdomen was performed for planning purposes. This again demonstrated a small collection in the left lower quadrant. Skin entry site was marked, the overlying skin was prepped and draped in a standard sterile fashion. Local analgesia was obtained with 1% lidocaine. Using intermittent CT fluoroscopy, a 19 gauge Yueh catheter was advanced towards the identified left lower quadrant collection. Location was confirmed with CT and return of purulent material. Primary aspiration was performed as the collection was felt to be too small for safe drain placement. A total of 15 mL of purulent material was aspirated, a sample was sent to the lab for analysis. Postprocedure CT demonstrated no hematoma common appropriate decompression of the abscess cavity. A clean dressing was placed. The patient tolerated the procedure well without immediate complication. IMPRESSION: Successful CT-guided aspiration of left lower quadrant abscess, with removal of 15 mL of purulent material. Sample sent to the lab for microbiology analysis. Electronically Signed   By: Albin Felling M.D.   On: 04/12/2022 10:43   Recent Labs    04/10/22 0250 04/12/22 0644  WBC 5.5 6.3  HGB 8.2* 8.0*  HCT 23.1* 23.8*  PLT 301 310    Recent Labs    04/10/22 0250 04/12/22 0644  NA 133* 132*  K 3.4* 3.4*  CL 100 99  CO2 24 25  GLUCOSE 101* 98  BUN 12 8  CREATININE 0.92 1.00  CALCIUM 8.3* 8.2*     Intake/Output Summary (Last 24 hours) at 04/12/2022 1056 Last data filed at 04/12/2022 0920 Gross per 24 hour  Intake 236 ml  Output 1630 ml  Net -1394 ml      Pressure Injury 04/09/22 Heel Left Deep Tissue Pressure Injury - Purple or maroon localized area of discolored intact  skin or blood-filled blister due to damage of underlying soft tissue from pressure and/or shear. (Active)  04/09/22 1315  Location: Heel  Location Orientation: Left  Staging: Deep Tissue Pressure Injury - Purple or maroon localized area of discolored intact  skin or blood-filled blister due to damage of underlying soft tissue from pressure and/or shear.  Wound Description (Comments):   Present on Admission: Yes     Pressure Injury 04/09/22 Heel Right Deep Tissue Pressure Injury - Purple or maroon localized area of discolored intact skin or blood-filled blister due to damage of underlying soft tissue from pressure and/or shear. (Active)  04/09/22 1315  Location: Heel  Location Orientation: Right  Staging: Deep Tissue Pressure Injury - Purple or maroon localized area of discolored intact skin or blood-filled blister due to damage of underlying soft tissue from pressure and/or shear.  Wound Description (Comments):   Present on Admission: Yes    Physical Exam: Vital Signs Blood pressure 115/83, pulse 96, temperature 99.1 F (37.3 C), temperature source Oral, resp. rate 20, height '5\' 9"'$  (1.753 m), weight 75.1 kg, SpO2 99 %.   General: Alert and oriented x 3, No apparent distress HEENT: Head is normocephalic, atraumatic, PERRLA, EOMI, sclera anicteric, oral mucosa pink and moist,  Neck: Supple without JVD or lymphadenopathy Heart: Reg rate and rhythm. No murmurs rubs or gallops Chest: CTA bilaterally without wheezes, rales, or rhonchi; no distress Abdomen: Soft, mild tenderness, bowel sounds positive. Colostomy viable with stool. Comments: Pelvis JP drain x2 with small amount of yellow-white drainage. Skin: Slightly erythematous, but skin intact at former drain site. Stoma aroma colostomy intact. Heel protectors are on both heels. There is a small area of redness on lateral aspect of left heel and medial aspect of right heel.  But skin is intact without ulceration. Extremities: No clubbing, cyanosis, or edema. Pulses are 2+,  Neuro: Patient is alert and oriented x3, sitting up in bed, follows commands, No focal deficits. Musculoskeletal: FROM all 4 extremities with exception of having a difficult time crossing one leg over the other. Strength:  4+/5 overall, BUE and BLE.  Sensory: no deficits. Psych: Pt's affect is appropriate. Pt is cooperative. Pleasant  Assessment/Plan: 1. Functional deficits which require 3+ hours per day of interdisciplinary therapy in a comprehensive inpatient rehab setting. Physiatrist is providing close team supervision and 24 hour management of active medical problems listed below. Physiatrist and rehab team continue to assess barriers to discharge/monitor patient progress toward functional and medical goals  Care Tool:  Bathing    Body parts bathed by patient: Right arm, Left arm, Chest, Abdomen, Face   Body parts bathed by helper:  (patient preferring to wait to do LB to allow nursing to do dressing changes.)     Bathing assist Assist Level: Maximal Assistance - Patient 24 - 49%     Upper Body Dressing/Undressing Upper body dressing   What is the patient wearing?: Hospital gown only    Upper body assist Assist Level: Minimal Assistance - Patient > 75%    Lower Body Dressing/Undressing Lower body dressing      What is the patient wearing?: Incontinence brief     Lower body assist Assist for lower body dressing: Total Assistance - Patient < 25%     Toileting Toileting    Toileting assist Assist for toileting: Minimal Assistance - Patient > 75%     Transfers Chair/bed transfer  Transfers assist     Chair/bed transfer assist level: Contact Guard/Touching assist  Locomotion Ambulation   Ambulation assist      Assist level: Minimal Assistance - Patient > 75% Assistive device: Walker-rolling Max distance: 15   Walk 10 feet activity   Assist     Assist level: Minimal Assistance - Patient > 75% Assistive device: Walker-rolling   Walk 50 feet activity   Assist Walk 50 feet with 2 turns activity did not occur: Safety/medical concerns         Walk 150 feet activity   Assist Walk 150 feet activity did not occur: Safety/medical concerns         Walk  10 feet on uneven surface  activity   Assist Walk 10 feet on uneven surfaces activity did not occur: Safety/medical concerns         Wheelchair     Assist Is the patient using a wheelchair?: Yes Type of Wheelchair: Manual    Wheelchair assist level: Dependent - Patient 0% Max wheelchair distance: 150    Wheelchair 50 feet with 2 turns activity    Assist        Assist Level: Dependent - Patient 0%   Wheelchair 150 feet activity     Assist      Assist Level: Dependent - Patient 0%   Blood pressure 115/83, pulse 96, temperature 99.1 F (37.3 C), temperature source Oral, resp. rate 20, height '5\' 9"'$  (1.753 m), weight 75.1 kg, SpO2 99 %.   Medical Problem List and Plan: 1. Functional deficits secondary to debility secondary to pelvic abscess.  Status post laparoscopy, abdominal washout drain placement with creation of diverting colostomy 03/20/2022             -patient may shower but incisions/drains should be covered             -ELOS/Goals: 10-14 days modI             -Continue PT/OT 2.  Antithrombotics: -DVT/anticoagulation:  Pharmaceutical: Lovenox.  Venous Doppler negative             -antiplatelet therapy: N/A      5/27 Continue Lovenox 5/28 Pt will have CT guided abdominal fluid drainage either 5/29 or 5/30 with Lovenox to be held the morning of the procedure. 3. Pain Management: Robaxin 1000 mg 3 times daily, tramadol as needed      5/29 Pain under good control today 4. Mood: Ativan as needed as well as trazodone             -antipsychotic agents: N/A 5. Neuropsych: This patient is capable of making decisions on his own behalf. 6. Skin/Wound Care: Routine skin checks with colostomy education 7. Fluids/Electrolytes/Nutrition: Routine in and outs with follow-up chemistries 5/27 Slightly low sodium (133) and potassium (3.4), continue to trend, consider repleting  BMET    Component Value Date/Time   NA 132 (L) 04/12/2022 0644   K 3.4 (L) 04/12/2022  0644   CL 99 04/12/2022 0644   CO2 25 04/12/2022 0644   GLUCOSE 98 04/12/2022 0644   BUN 8 04/12/2022 0644   CREATININE 1.00 04/12/2022 0644   CALCIUM 8.2 (L) 04/12/2022 0644   GFRNONAA >60 04/12/2022 0644     8.  ID/intra-abdominal infection.  Continue micafungin 100 mg daily as well as Zosyn 3.375 g every 8 hours.  Plan is for 6 weeks starting date 5/16 ending 05/11/2022 and will need repeat CT abdomen/pelvis on left thigh in 1 to 2 weeks -5/27 Pt has event last night on 5/26 of temp 101.6, HR 115, new  onset cough, with shortness of breath.  Per night nurse on 5/26-5/27 became diaphoretic around 0000 on 5/27 and drenched the bed with sweat, with no subsequent fevers or episodes of tachycardia. Orders were placed for CBC, BMET, blood cultures x2, CXR, COVID, respiratory panel, and repeat CT abd/pelvis. ID was consulted the evening of 5/26 with recommendation given to continue current regimen of 6 week Zosyn and micafungin based on original plan.  Per [place], CT abdomen/pelvis indicated with febrile event, especially in light that patient's last CT scan was 04/05/22 and he inadvertently lost one of his JP drains on the morning of 04/06/22. -Respiratory panel and COVID negative.  CXR shows Small left pleural effusion with atelectasis at the left lung base. -CT abd/pelvis: demonstrates a complex left retroperitoneal fluid collection, with superior and inferior components that appear to communicate overlying the Gerota's fascia and left psoas musculature. ID to round on pt on 04/11/22 and will decide next steps. 04/11/22 See ID and Radiology note.  IR was consulted for intra-abdominal fluid collection. Dr. Denna Haggard (IR) has approved patient for aspiration of inferior portion of fluid collection. -Per Radiology note, PA will reach out to General Surgery (Dr. Marcello Moores) to notify of plan. Also, called Dr. Rosendo Gros, MD on-call for General Surgery this weekend to update office on patient since surgery signed  off on 04/09/22.  I was unable to reach provider since he was in-procedure.  But relayed message by telephone to nurse, Karena Addison, [place] of patient's febrile episode 04/09/22-04/10/22 and plans for abdominal fluid removal and possible drain placement by IR. 5/29 IR completed aspiration of 17m of purulent material, no drain placed 9.  Postoperative anemia.  Continue iron supplement.  Follow-up CBC 5/28 Hgb 8.2 low but stable, continue to trend and monitor. 5/29 Hgb stable at 8.0, follow     Latest Ref Rng & Units 04/12/2022    6:44 AM 04/10/2022    2:50 AM 04/09/2022    3:45 PM  CBC  WBC 4.0 - 10.5 K/uL 6.3   5.5   4.3    Hemoglobin 13.0 - 17.0 g/dL 8.0   8.2   8.1    Hematocrit 39.0 - 52.0 % 23.8   23.1   24.1    Platelets 150 - 400 K/uL 310   301   296      10.  Hypertension.  Continue Zebeta 10 mg nightly,   Monitor with increased mobility 5/28 BP appears well controlled, continue Zebeta 10 mg nightly.    04/12/2022   10:05 AM 04/12/2022   10:00 AM 04/12/2022    9:45 AM  Vitals with BMI  Systolic [id number] Diastolic 83 78 73  Pulse 96 94 90    11.  Decreased nutritional storage.  Dietary follow-up. Continue Prosource.  12.  Urinary retention.  Foley tube removed and urinating.  Check PVR 5/27 Pt is voiding okay, reported that he had some urinary incontinence for the first day or two immediately following Foley removal, which has since resolved. 5/28 No issues with voiding.  13. Hypokalemia  -KCL today, repeat labs 14. Hypoalbuminemia  -Continue ensure supplementation  LOS: 3 days A FACE TO FACE EVALUATION WAS PERFORMED  YJennye Boroughs5/29/2023, 10:56 AM

## 2022-04-12 NOTE — Procedures (Signed)
Interventional Radiology Procedure Note  Date of Procedure: 04/12/2022  Procedure: CT aspiration   Findings:  1. CT aspiration of intra-abdominal fluid collection yielded 15 ml purulent material. Sample sent to lab. Collection too small for drain placement.    Complications: No immediate complications noted.   Estimated Blood Loss: minimal  Follow-up and Recommendations: 1. F/u microbiology results    Albin Felling, MD  Vascular & Interventional Radiology  04/12/2022 10:10 AM

## 2022-04-12 NOTE — IPOC Note (Signed)
Overall Plan of Care [facility]) Patient Details Name: Tyler [name] MRN: [id number] DOB: 17-Mar-1951  Admitting Diagnosis: Abscess, intra-abdominal, postoperative  Hospital Problems: Principal Problem:   Abscess, intra-abdominal, postoperative Active Problems:   Debility   Sepsis due to Enterococcus ([place])   E coli infection     Functional Problem List: Nursing Bowel, Endurance, Pain, Medication Management, Skin Integrity, Safety, Nutrition  PT Balance, Edema, Endurance, Pain, Skin Integrity  OT Balance, Endurance, Pain, Skin Integrity  SLP    TR         Basic ADL's: OT Grooming, Bathing, Dressing, Toileting     Advanced  ADL's: OT Simple Meal Preparation, Light Housekeeping     Transfers: PT Bed Mobility, Bed to Chair, Car, [employer], Futures trader, Metallurgist: PT Ambulation, Emergency planning/management officer, Stairs     Additional Impairments: OT Other (comment) (overall activity tolerance and UE strength)  SLP        TR      Anticipated Outcomes Item Anticipated Outcome  Self Feeding    Swallowing      Basic self-care  mod I  Toileting      Bathroom Transfers    Bowel/Bladder  manage bowel w min assist  Transfers  Supervision assist trasnfers  Locomotion  Supervsion assist ambulation with RW for house hold distances.  Communication     Cognition     Pain  pain at or below level 4 with prns  Safety/Judgment  maintain safety w cues   Therapy Plan: PT Intensity: Minimum of 1-2 x/day ,45 to 90 minutes PT Frequency: 5 out of 7 days PT Duration Estimated Length of Stay: 14-16 days OT Intensity: Minimum of 1-2 x/day, 45 to 90 minutes OT Frequency: 5 out of 7 days OT Duration/Estimated Length of Stay: 14-16 days     Team Interventions: Nursing Interventions Disease Management/Prevention, Medication Management, Discharge Planning, Skin Care/Wound Management, Pain Management, Bowel Management, Patient/Family Education, Psychosocial Support   PT interventions Ambulation/gait training, Discharge planning, Functional mobility training, Psychosocial support, Visual/perceptual remediation/compensation, Therapeutic Activities, Balance/vestibular training, Disease management/prevention, Neuromuscular re-education, Skin care/wound management, Therapeutic Exercise, Wheelchair propulsion/positioning, Cognitive remediation/compensation, DME/adaptive equipment instruction, Pain management, Splinting/orthotics, UE/LE Strength taining/ROM, Academic librarian, Barrister's clerk education, IT trainer, UE/LE Coordination activities  OT Interventions Training and development officer, Discharge planning, Pain management, Self Care/advanced ADL retraining, Therapeutic Activities, UE/LE Coordination activities, Disease mangement/prevention, Functional mobility training, Patient/family education, Skin care/wound managment, Therapeutic Exercise, Community reintegration, Engineer, drilling, Psychosocial support, UE/LE Strength taining/ROM, Wheelchair propulsion/positioning  SLP Interventions    TR Interventions    SW/CM Interventions Discharge Planning, Psychosocial Support, Patient/Family Education   Barriers to Discharge MD  Medical stability, Home enviroment access/loayout, and Wound care  Nursing Decreased caregiver support, Wound Care, IV antibiotics, Other (comments) (new ostomy) 1 level 3ste w significant other  PT Inaccessible home environment, Decreased caregiver support, Home environment access/layout, IV antibiotics, Wound Care, Insurance for SNF coverage    OT      SLP      SW Insurance for SNF coverage     Team Discharge Planning: Destination: PT-Home ,OT- Home , SLP-  Projected Follow-up: PT-Home health PT, OT-  Home health OT, SLP-  Projected Equipment Needs: PT-Rolling walker with 5" wheels, Wheelchair (measurements), Wheelchair cushion (measurements), OT- Rolling walker with 5" wheels, Wheelchair (measurements),  Wheelchair cushion (measurements), SLP-  Equipment Details: PT- , OT-pt reports he has a commode and shower chair Patient/family involved in discharge planning: PT- Patient,  OT-Patient, SLP-   MD  ELOS: 10-14 Medical Rehab Prognosis:  Excellent Assessment: The patient has been admitted for CIR therapies with the diagnosis ofdebility secondary to pelvic abscess.  Status post laparoscopy, abdominal washout drain placement with creation of diverting colostomy 03/20/2022. The team will be addressing functional mobility, strength, stamina, balance, safety, adaptive techniques and equipment, self-care, bowel and bladder mgt, patient and caregiver education. Goals have been set at MOD i. Anticipated discharge destination is HOME.        See Team Conference Notes for weekly updates to the plan of care

## 2022-04-12 NOTE — Progress Notes (Signed)
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), bilateral    Chronic pruritic rash in adult 2004   hx shingles  w/ residual recurrent rash, take valtrex daily   E coli infection 04/12/2022   ED (erectile dysfunction)    GERD (gastroesophageal reflux disease)    H/O pityriasis rosea    History of basal cell carcinoma (BCC) excision    2022 left calf s/p mohs   History of pertussis 2018   History of SCC (squamous cell carcinoma) of skin    removed from head   History of thrombocytopenia 12/03/2010   Hx of adenomatous polyp of colon 04/07/2017   Hypertension    Mild asthma    followed by pcp   OA (osteoarthritis)    knees, fingers   Prolapsed internal hemorrhoids, grade 3    hx  external hemorroid banding   Sepsis due to Enterococcus ([place]) 04/12/2022   Wears glasses    Past Surgical History:  Past Surgical History:  Procedure Laterality Date   COLONOSCOPY WITH PROPOFOL  04/01/2017   by dr Carlean Purl   DIRECT LARYNGOSCOPY  1986   left vocal fold bx   (non-cancerous granuloma)   FINGER SURGERY Left 2017   ligament and tendon repair left index finger   HEMORRHOID SURGERY N/A 03/15/2022   Procedure: ANAL EXAM UNDER ANESTHESIA WITH PROCTOSCOPY  AND DEBRIDEMENT;  Surgeon: Leighton Ruff, MD;  Location: [facility];  Service: General;  Laterality: N/A;   INGUINAL HERNIA REPAIR Right 05/25/2018   Procedure: RIGHT INGUINAL HERNIA REPAIR WITH MESH;  Surgeon: Jovita Kussmaul, MD;   Location: [facility];  Service: General;  Laterality: Right;   INSERTION OF MESH Right 05/25/2018   Procedure: INSERTION OF MESH;  Surgeon: Jovita Kussmaul, MD;  Location: [facility];  Service: General;  Laterality: Right;   KNEE ARTHROSCOPY W/ MENISCAL REPAIR Right 10/26/2005   '@[facility]'$   by Dr  Wynelle Link   LAPAROSCOPIC APPENDECTOMY  03/22/2011   '@[facility]'$    LAPAROSCOPY N/A 03/20/2022   Procedure: DIAGNOSTIC LAPAROSCOPY; ABDOMINAL [place] OUT AND DRAIN PLACEMENT; CREATION OF DIVERTING COLOSTOMY; ANAL EXAM UNDER ANESTHESIA;  Surgeon: Leighton Ruff, MD;  Location: [facility];  Service: General;  Laterality: N/A;   MOHS SURGERY  05/2021   left lower leg   TONSILLECTOMY  1958   TRANSANAL HEMORRHOIDAL DEARTERIALIZATION N/A 03/05/2022   Procedure: TRANSANAL HEMORRHOIDAL DEARTERIALIZATION;  Surgeon: Leighton Ruff, MD;  Location: [facility];  Service: General;  Laterality: N/A;   Social History:  reports that he quit smoking about 33 years ago. His smoking use included cigarettes. He has never used smokeless tobacco. He reports current alcohol use of about 28.0 standard drinks per week. He reports that he does not use drugs.  Family / Support Systems Marital Status: Single Patient Roles: Partner Spouse/Significant Other: Mitzi Hansen [phone]  [phone] Other Supports: Friends and co-workers Anticipated Caregiver: Jonni Sanger Ability/Limitations of Caregiver: None Caregiver Availability: 24/7 Family Dynamics: Close knit with family and friends, pt was very independent and worked for [employer] prior to this  hospitalization. 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 He feels this has been a long journey but he is coming out the other side. He wants to do all that he can for himsefl before he leaves here, but realizes he will need assist with some of his care, his partner is willing to assist Recent Psychosocial Issues: other health issue but managing well until this Psychiatric History: No history seems to be coping appropriately with all of this and is taking each day at a time . He is able to verbalize his feelings and concerns and is futire oriented and ready to do rehab and get home Substance Abuse History: No issues  Patient / Family Perceptions, Expectations & Goals Pt/Family understanding of illness & functional limitations: Pt and Jonni Sanger have a good understanding of his medical procedures and medical issues he has had since coming into the hospital. Both talk with the MD and feel they have a good understanding of his plan moving forward and their questions and concerns are being addressed. 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 I know I will  need some help with the dressing changes but can direct my care. Jonni Sanger states: " I will be here daily to watch and make sure I know what to do at discharge."  [employer]: None Premorbid Home Care/DME Agencies: None Transportation available at discharge: self and Jonni Sanger will now Is the patient able to respond to transportation needs?: Yes In the past 12 months, has lack of transportation kept you from medical appointments or from getting medications?: No In the past 12 months, has lack of transportation kept you from meetings, work, or from getting things needed for daily living?: No  Discharge Planning Living Arrangements: Spouse/significant other Support Systems: Spouse/significant other, Friends/neighbors Type of Residence: Private residence 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 He plans to be here daily and observe and do hands on when team feels it is appropriate. Pt is motivated and ready to do rehab and get back home. Aware ELOS is approx 14-16 days Care Coordinator Barriers to Discharge: Insurance for SNF coverage Care Coordinator Anticipated Follow Up Needs: HH/OP  Clinical Impression Pleasant gentleman who was very independent  prior to this hospitalization. His partner is very involved and supportive. Both aware of ELOS and hope to do well and be as independent as possible prior to going home. Aware of team conference on Wednesday. Will work on discharge needs, aware will need IV antibiotics at DC  Elease Hashimoto 04/12/2022, 12:26 PM

## 2022-04-12 NOTE — Progress Notes (Signed)
[facility] Individual Statement of Services  Patient Name:  Tyler [name]  Date:  04/12/2022  Welcome to the [facility].  Our goal is to provide you with an individualized program based on your diagnosis and situation, designed to meet your specific needs.  With this comprehensive rehabilitation program, you will be expected to participate in at least 3 hours of rehabilitation therapies Monday-Friday, with modified therapy programming on the weekends.  Your rehabilitation program will include the following services:  Physical Therapy (PT), Occupational Therapy (OT), 24 hour per day rehabilitation nursing, Therapeutic Recreaction (TR), Neuropsychology, Care Coordinator, Rehabilitation Medicine, Nutrition Services, and Pharmacy Services  Weekly team conferences will be held on Wednesday to discuss your progress.  Your Inpatient Rehabilitation Care Coordinator will talk with you frequently to get your input and to update you on team discussions.  Team conferences with you and your family in attendance may also be held.  Expected length of stay: 14-16 days  Overall anticipated outcome: independent-supervision level  Depending on your progress and recovery, your program may change. Your Inpatient Rehabilitation Care Coordinator will coordinate services and will keep you informed of any changes. Your Inpatient Rehabilitation Care Coordinator's name and contact numbers are listed  below.  The following services may also be recommended but are not provided by the [facility] will be made to provide these services after discharge if needed.  Arrangements include referral to agencies that provide these services.  Your insurance has been verified to be:  [employer] Your primary doctor is:  Cari Caraway  Pertinent information  will be shared with your doctor and your insurance company.  Inpatient Rehabilitation Care Coordinator:  Ovidio Kin, [place] or Emilia Beck  Information discussed with and copy given to patient by: Elease Hashimoto, 04/12/2022, 9:58 AM

## 2022-04-13 DIAGNOSIS — T8143XA Infection following a procedure, organ and space surgical site, initial encounter: Secondary | ICD-10-CM

## 2022-04-13 DIAGNOSIS — A4181 Sepsis due to Enterococcus: Secondary | ICD-10-CM

## 2022-04-13 DIAGNOSIS — A498 Other bacterial infections of unspecified site: Secondary | ICD-10-CM

## 2022-04-13 DIAGNOSIS — R52 Pain, unspecified: Secondary | ICD-10-CM

## 2022-04-13 DIAGNOSIS — B9620 Unspecified Escherichia coli [E. coli] as the cause of diseases classified elsewhere: Secondary | ICD-10-CM

## 2022-04-13 LAB — COMPREHENSIVE METABOLIC PANEL
ALT: 36 U/L (ref 0–44)
AST: 28 U/L (ref 15–41)
Albumin: 1.9 g/dL — ABNORMAL LOW (ref 3.5–5.0)
Alkaline Phosphatase: 287 U/L — ABNORMAL HIGH (ref 38–126)
Anion gap: 6 (ref 5–15)
BUN: 10 mg/dL (ref 8–23)
CO2: 26 mmol/L (ref 22–32)
Calcium: 8.4 mg/dL — ABNORMAL LOW (ref 8.9–10.3)
Chloride: 103 mmol/L (ref 98–111)
Creatinine, Ser: 1 mg/dL (ref 0.61–1.24)
GFR, Estimated: >60 mL/min (ref >60)
Glucose, Bld: 105 mg/dL — ABNORMAL HIGH (ref 70–99)
Potassium: 4 mmol/L (ref 3.5–5.1)
Sodium: 135 mmol/L (ref 135–145)
Total Bilirubin: 0.5 mg/dL (ref 0.3–1.2)
Total Protein: 6.3 g/dL — ABNORMAL LOW (ref 6.5–8.1)

## 2022-04-13 LAB — MAGNESIUM: Magnesium: 2 mg/dL (ref 1.7–2.4)

## 2022-04-13 NOTE — Progress Notes (Signed)
Occupational Therapy Session Note  Patient Details  Name: Tyler [name] MRN: [id number] Date of Birth: 12/21/1950  Today's Date: 04/13/2022 OT Individual Time: 0800-0905 OT Individual Time Calculation (min): 65 min    Short Term Goals: Week 1:  OT Short Term Goal 1 (Week 1): Pt will complete grooming sink side level standing up to 4 minute intervals with brief intermittent rests seated as needed with close S OT Short Term Goal 2 (Week 1): Pt will complete toilet and shower bench transfers with CGA using grab bars OT Short Term Goal 3 (Week 1): Pt will complete UB bathing and dressing with close S after set up OT Short Term Goal 4 (Week 1): Pt will complete LB bathing and dressing using AE as needed with min A OT Short Term Goal 5 (Week 1): Pt will verbally identify, express and employ 3/3 energy conservation strategies during ADL's, functional mobility and light UE ther ex//act with min VC's.  Skilled Therapeutic Interventions/Progress Updates:  Pt seen for am OT session to progress activity tolerance and overall strength for AM self care routine. Pt awake with HOB elevated upon OT arrival and eager to participate. Pt reports he feels much stronger today and ready for the day. Pt was able to self cue to recall to breathe and take the rests he needs through the session since he has multiple therapies today. Reports discomfort at times in buttock wound area but denies pain otherwise.   Pt required only cues and CG A for rolling to R side and supine to sit. Sat EOB with CGA with improvement noted with more anterior pelvis and no retropulsion tendency vs IE. Pt was able to perform sit to stand with RW with CGA and transfer SPT with 4 steps to w/c. Pt self propelled to sink side ~ 8 ft with S with OT managing IV pole and drains throughout session for safety. Pt requesting to perform more complex ADL's sink side including facial hair trimming, shaving with electric razor and nail trimming in  addition to sponge bathing, dressing and grooming. OT provided brief intro on AE including reacher and LHS with demonstration initiated during care. Pt was able to complete UB sponge bathing with Close S and gown donning with min A. LB bathing and dressing with mod A (incontinence brief and slipper socks). Stood for 3 trials of up to 2 minutes using RW with support during care with CGA with seated rests. Pt requested hair washing with OT who was seeing next session back to back. Gregary Signs OT arrived, care coordination conducted and this clinician completed session.    Therapy/Group: Individual Therapy  Barnabas Lister 04/13/2022, 11:12 AM

## 2022-04-13 NOTE — Progress Notes (Signed)
PROGRESS NOTE   Subjective/Complaints:   Pt working with OT on grooming. Reports pain around buttocks is controlled overall.   Albuterol is helping his asthma.    Review of Systems  Constitutional:  Negative for fever.       Fever and diaphoresis overnight 04/09/22-04/10/22 have resolved.  HENT:  Negative for congestion and sinus pain.   Eyes: Negative.   Respiratory:  Negative for cough, shortness of breath and wheezing.        Cough and shortness of breath overnight 04/09/22-04/10/22 have resolved.  Cardiovascular:  Negative for chest pain and palpitations.  Gastrointestinal:  Negative for nausea and vomiting.  Genitourinary:  Positive for urgency. Negative for dysuria.  Musculoskeletal:  Negative for myalgias.  Neurological:  Negative for dizziness, focal weakness, weakness and headaches.  Endo/Heme/Allergies: Negative.   Psychiatric/Behavioral:  Negative for depression. The patient is nervous/anxious. The patient does not have insomnia.     Objective:   CT ASPIRATION  Result Date: 04/12/2022 INDICATION: Abdominal abscess EXAM: CT-guided aspiration of abdominal abscess TECHNIQUE: Multidetector CT imaging of the abdomen was performed following the standard protocol without IV contrast. RADIATION DOSE REDUCTION: This exam was performed according to the departmental dose-optimization program which includes automated exposure control, adjustment of the mA and/or kV according to patient size and/or use of iterative reconstruction technique. MEDICATIONS: The patient is currently admitted to the hospital and receiving intravenous antibiotics. The antibiotics were administered within an appropriate time frame prior to the initiation of the procedure. ANESTHESIA/SEDATION: Moderate (conscious) sedation was employed during this procedure. A total of Versed 2 mg and Fentanyl 100 mcg was administered intravenously by the radiology nurse. Total  intra-service moderate Sedation Time: 10 minutes. The patient's level of consciousness and vital signs were monitored continuously by radiology nursing throughout the procedure under my direct supervision. COMPLICATIONS: None immediate. PROCEDURE: Informed written consent was obtained from the patient after a thorough discussion of the procedural risks, benefits and alternatives. All questions were addressed. Maximal Sterile Barrier Technique was utilized including caps, mask, sterile gowns, sterile gloves, sterile drape, hand hygiene and skin antiseptic. A timeout was performed prior to the initiation of the procedure. The patient was placed supine on the exam table. Limited CT of the abdomen was performed for planning purposes. This again demonstrated a small collection in the left lower quadrant. Skin entry site was marked, the overlying skin was prepped and draped in a standard sterile fashion. Local analgesia was obtained with 1% lidocaine. Using intermittent CT fluoroscopy, a 19 gauge Yueh catheter was advanced towards the identified left lower quadrant collection. Location was confirmed with CT and return of purulent material. Primary aspiration was performed as the collection was felt to be too small for safe drain placement. A total of 15 mL of purulent material was aspirated, a sample was sent to the lab for analysis. Postprocedure CT demonstrated no hematoma common appropriate decompression of the abscess cavity. A clean dressing was placed. The patient tolerated the procedure well without immediate complication. IMPRESSION: Successful CT-guided aspiration of left lower quadrant abscess, with removal of 15 mL of purulent material. Sample sent to the lab for microbiology analysis. Electronically Signed   By: Murrell Redden  El-Abd M.D.   On: 04/12/2022 10:43   Recent Labs    04/12/22 0644  WBC 6.3  HGB 8.0*  HCT 23.8*  PLT 310    Recent Labs    04/12/22 0644 04/13/22 0420  NA 132* 135  K 3.4* 4.0   CL 99 103  CO2 25 26  GLUCOSE 98 105*  BUN 8 10  CREATININE 1.00 1.00  CALCIUM 8.2* 8.4*     Intake/Output Summary (Last 24 hours) at 04/13/2022 0851 Last data filed at 04/13/2022 0813 Gross per 24 hour  Intake 840 ml  Output 2130 ml  Net -1290 ml      Pressure Injury 04/09/22 Heel Left Deep Tissue Pressure Injury - Purple or maroon localized area of discolored intact skin or blood-filled blister due to damage of underlying soft tissue from pressure and/or shear. (Active)  04/09/22 1315  Location: Heel  Location Orientation: Left  Staging: Deep Tissue Pressure Injury - Purple or maroon localized area of discolored intact skin or blood-filled blister due to damage of underlying soft tissue from pressure and/or shear.  Wound Description (Comments):   Present on Admission: Yes     Pressure Injury 04/09/22 Heel Right Deep Tissue Pressure Injury - Purple or maroon localized area of discolored intact skin or blood-filled blister due to damage of underlying soft tissue from pressure and/or shear. (Active)  04/09/22 1315  Location: Heel  Location Orientation: Right  Staging: Deep Tissue Pressure Injury - Purple or maroon localized area of discolored intact skin or blood-filled blister due to damage of underlying soft tissue from pressure and/or shear.  Wound Description (Comments):   Present on Admission: Yes    Physical Exam: Vital Signs Blood pressure 129/87, pulse 100, temperature 98.1 F (36.7 C), temperature source Oral, resp. rate 16, height '5\' 9"'$  (1.753 m), weight 75.1 kg, SpO2 98 %.   General: Alert and oriented x 3, No apparent distress, sitting in WC with OT HEENT: Head is normocephalic, atraumatic, PERRLA, EOMI, sclera anicteric, oral mucosa pink and moist,  Neck: Supple Heart: Reg rate and rhythm. No murmurs rubs or gallops Chest: CTA bilaterally without wheezes, rales, or rhonchi; no distress Abdomen: Soft, minimal tenderness, bowel sounds positive. Colostomy  viable with stool. Comments: Pelvis JP drain x2 with small amount of yellow-white drainage. Skin: Slightly erythematous, but skin intact at former drain site. Stoma aroma colostomy intact. Heel protectors are on both heels. There is a small area of redness on lateral aspect of left heel and medial aspect of right heel.  But skin is intact without ulceration. Extremities: No clubbing, cyanosis, or edema. Pulses are 2+,  Neuro: Patient is alert and oriented x3, sitting up in bed, follows commands, No focal deficits. Musculoskeletal: FROM all 4 extremities , no joint swelling Strength: 4+/5 overall, BUE and BLE.  Sensory: intact to LT in all 4 extremities Psych: Pt's affect is appropriate. Pt is cooperative. Pleasant  Assessment/Plan: 1. Functional deficits which require 3+ hours per day of interdisciplinary therapy in a comprehensive inpatient rehab setting. Physiatrist is providing close team supervision and 24 hour management of active medical problems listed below. Physiatrist and rehab team continue to assess barriers to discharge/monitor patient progress toward functional and medical goals  Care Tool:  Bathing    Body parts bathed by patient: Right arm, Left arm, Chest, Abdomen, Face   Body parts bathed by helper:  (patient preferring to wait to do LB to allow nursing to do dressing changes.)     Bathing assist  Assist Level: Maximal Assistance - Patient 24 - 49%     Upper Body Dressing/Undressing Upper body dressing   What is the patient wearing?: Hospital gown only    Upper body assist Assist Level: Minimal Assistance - Patient > 75%    Lower Body Dressing/Undressing Lower body dressing      What is the patient wearing?: Incontinence brief     Lower body assist Assist for lower body dressing: Total Assistance - Patient < 25%     Toileting Toileting    Toileting assist Assist for toileting: Minimal Assistance - Patient > 75%     Transfers Chair/bed  transfer  Transfers assist     Chair/bed transfer assist level: Contact Guard/Touching assist     Locomotion Ambulation   Ambulation assist      Assist level: Minimal Assistance - Patient > 75% Assistive device: Walker-rolling Max distance: 15   Walk 10 feet activity   Assist     Assist level: Minimal Assistance - Patient > 75% Assistive device: Walker-rolling   Walk 50 feet activity   Assist Walk 50 feet with 2 turns activity did not occur: Safety/medical concerns         Walk 150 feet activity   Assist Walk 150 feet activity did not occur: Safety/medical concerns         Walk 10 feet on uneven surface  activity   Assist Walk 10 feet on uneven surfaces activity did not occur: Safety/medical concerns         Wheelchair     Assist Is the patient using a wheelchair?: Yes Type of Wheelchair: Manual    Wheelchair assist level: Dependent - Patient 0% Max wheelchair distance: 150    Wheelchair 50 feet with 2 turns activity    Assist        Assist Level: Dependent - Patient 0%   Wheelchair 150 feet activity     Assist      Assist Level: Dependent - Patient 0%   Blood pressure 129/87, pulse 100, temperature 98.1 F (36.7 C), temperature source Oral, resp. rate 16, height '5\' 9"'$  (1.753 m), weight 75.1 kg, SpO2 98 %.   Medical Problem List and Plan: 1. Functional deficits secondary to debility secondary to pelvic abscess.  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  Antithrombotics: -DVT/anticoagulation:  Pharmaceutical: Lovenox.  Venous Doppler negative             -antiplatelet therapy: N/A 5/27 Continue Lovenox 5/28 Pt will have CT guided abdominal fluid drainage either 5/29 or  5/30 with Lovenox to be held the morning of the procedure. 3. Pain Management: Robaxin 1000 mg 3 times daily, tramadol as needed 5/30- Pain under control, no recent use of tramadol 4. Mood: Ativan as needed as well as trazodone             -antipsychotic agents: N/A 5. Neuropsych: This patient is capable of making decisions on his own behalf. 6. Skin/Wound Care: Routine skin checks with colostomy education 7. Fluids/Electrolytes/Nutrition: Routine in and outs with follow-up chemistries 5/27 Slightly low sodium (133) and potassium (3.4), continue to trend, consider  repleting 5/30 Na up to 135 and K wnl after repletion  BMET    Component Value Date/Time   NA 135 04/13/2022 0420   K 4.0 04/13/2022 0420   CL 103 04/13/2022 0420   CO2 26 04/13/2022 0420   GLUCOSE 105 (H) 04/13/2022 0420   BUN 10 04/13/2022 0420   CREATININE 1.00 04/13/2022 0420   CALCIUM 8.4 (L) 04/13/2022 0420   GFRNONAA >60 04/13/2022 0420     8.  ID/intra-abdominal infection.  Continue micafungin 100 mg daily as well as Zosyn 3.375 g every 8 hours.  Plan is for 6 weeks starting date 5/16 ending 05/11/2022 and will need repeat CT abdomen/pelvis on left thigh in 1 to 2 weeks -5/27 Pt has event last night on 5/26 of temp 101.6, HR 115, new onset cough, with shortness of breath.  Per night nurse on 5/26-5/27 became diaphoretic around 0000 on 5/27 and drenched the bed with sweat, with no subsequent fevers or episodes of tachycardia. Orders were placed for CBC, BMET, blood cultures x2, CXR, COVID, respiratory panel, and repeat CT abd/pelvis. ID was consulted the evening of 5/26 with recommendation given to continue current regimen of 6 week Zosyn and micafungin based on original plan.  Per [place], CT abdomen/pelvis indicated with febrile event, especially in light that patient's last CT scan was 04/05/22 and he inadvertently lost one of his JP drains on the morning of 04/06/22. -Respiratory panel and COVID negative.  CXR  shows Small left pleural effusion with atelectasis at the left lung base. -CT abd/pelvis: demonstrates a complex left retroperitoneal fluid collection, with superior and inferior components that appear to communicate overlying the Gerota's fascia and left psoas musculature. ID to round on pt on 04/11/22 and will decide next steps. 04/11/22 See ID and Radiology note.  IR was consulted for intra-abdominal fluid collection. Dr. Denna Haggard (IR) has approved patient for aspiration of inferior portion of fluid collection. -Per Radiology note, PA will reach out to General Surgery (Dr. Marcello Moores) to notify of plan. Also, called Dr. Rosendo Gros, MD on-call for General Surgery this weekend to update office on patient since surgery signed off on 04/09/22.  I was unable to reach provider since he was in-procedure.  But relayed message by telephone to nurse, Karena Addison, [place] of patient's febrile episode 04/09/22-04/10/22 and plans for abdominal fluid removal and possible drain placement by IR. 5/29 IR completed aspiration of 67m of purulent material, no drain placed 5/29 ID continuing Zosyn for now 9.  Postoperative anemia.  Continue iron supplement.  Follow-up CBC 5/28 Hgb 8.2 low but stable, continue to trend and monitor. 5/29 Hgb stable at 8.0, follow     Latest Ref Rng & Units 04/12/2022    6:44 AM 04/10/2022    2:50 AM 04/09/2022    3:45 PM  CBC  WBC 4.0 - 10.5 K/uL 6.3   5.5   4.3    Hemoglobin 13.0 - 17.0 g/dL 8.0   8.2   8.1    Hematocrit 39.0 - 52.0 % 23.8   23.1   24.1    Platelets 150 - 400 K/uL 310   301   296      10.  Hypertension.  Continue Zebeta 10 mg nightly,   Monitor with increased mobility 5/20 BP well controlled, continue to monitor, continue zebeta    04/13/2022    4:59 AM 04/12/2022    9:29 PM 04/12/2022    8:52 PM  Vitals with BMI  Systolic [id number]  Diastolic 87 82 85  Pulse 924 104 107    11.  Decreased nutritional storage.  Dietary follow-up. Continue Prosource.  12.  Urinary retention.  Foley  tube removed and urinating.  Check PVR 5/27 Pt is voiding okay, reported that he had some urinary incontinence for the first day or two immediately following Foley removal, which has since resolved. 5/28 No issues with voiding.  13. Hypokalemia  -5/29KCL today, repeat labs  -5/30 K+ wnl now 14. Hypoalbuminemia  -Continue ensure supplementation  -Discussed healthy diet, He ate all his breakfast today 5/30  LOS: 4 days A FACE TO [place] 04/13/2022, 8:51 AM

## 2022-04-13 NOTE — Progress Notes (Signed)
Physical Therapy Session Note  Patient Details  Name: Tyler [name] MRN: [id number] Date of Birth: 1951-01-09  Today's Date: 04/13/2022 PT Individual Time: [id number], 1410-1440 PT Individual Time Calculation (min): 39 min, 10 min   Short Term Goals: Week 1:  PT Short Term Goal 1 (Week 1): Pt will transfer to bed with CGA PT Short Term Goal 2 (Week 1): Pt will ambulate 88f with CGA PT Short Term Goal 3 (Week 1): Pt will propell WC 1081fwith supervision assist PT Short Term Goal 4 (Week 1): Pt will ascend 2 steps with min assist and BUE Support  Skilled Therapeutic Interventions/Progress Updates:    Session 1: Pt received in hallway as hand off from [place] Ridge[place] complaint of pain.  After rest break while JuGregary Signshared what they had worked on in previous session, pt continued propelling w/c with BLE with therapist managing IV pole x 50 ft. Session focused on standing tolerance and strength. Sit to stand in //bars with CGA x 45 sec and x 30 sec with extended seated rest break. During rest break noted tight hamstrings with pt reporting pain with knee extension. Pt also c/o back pain in standing, improved with rest. During second stand, pt reported feeling "pooped." Pt propelled chair back to room with BLE, x 100 ft. Pt requesting dressing change, so assisted back to bed with CGA Stand pivot transfer with RW. Pt stood 20-30 sec while doffing posterior gown with supervision. Pt returned to bed with supervision and remained with all needs in reach and alarm active. Pt missed 21 min of scheduled therapy d/t fatigue, will make up as able.   Session 2: pt received in bed and agreeable to therapy. No complaint of pain. Bed mobility with supervision Session focused on gait and endurance training. Sit to stand throughout with supervision. Pt ambulated with RW x 10 ft and x 50 ft with CGA and assist to manage IV pole. No LOB or knee buckling noted. Pt with slow, deliberate steps but largely functional gait  pattern. Pt reports no dizziness in standing this PM. Pt then propelled x 50 ft with BLE for hamstring strength. On returning to room, pt performed Sit to stand x 5 before becoming exhausted and requesting to rest. Pt agreeable to staying in w/c for a short time. NT made aware to check back in as pt feared sitting up for a long time. Pt remained in w/c, was left with all needs in reach and alarm active.   Therapy Documentation Precautions:  Precautions Precautions: Fall Precaution Comments: drains, colostomy, rectal drainage Restrictions Weight Bearing Restrictions: No General: PT Amount of Missed Time (min): 21 Minutes PT Missed Treatment Reason: Patient fatigue;Patient unwilling to participate    Therapy/Group: Individual Therapy  OlMickel Fuchs/30/2023, 10:37 AM

## 2022-04-13 NOTE — Progress Notes (Signed)
Inpatient Rehabilitation  Patient information reviewed and entered into eRehab system by Laisha Rau M. Worthy Boschert, M.A., CCC/SLP, PPS Coordinator.  Information including medical coding, functional ability and quality indicators will be reviewed and updated through discharge.    

## 2022-04-13 NOTE — Progress Notes (Signed)
Pt participates with nursing on pouch burping and emptying. Pt does need set up and positioning assistance, otherwise does well. Nursing please encourage pt as much as possible.    Gerald Stabs, RN

## 2022-04-13 NOTE — Progress Notes (Signed)
[facility]  Date of Admission:  04/09/2022           Reason for visit: Follow up on intra abdominal infection  Current antibiotics: Zosyn Micafungin  ASSESSMENT:    71 y.o. male admitted with:  Intra-abdominal infection: Patient admitted with sepsis following initial transanal hemorrhoidal dearterialization 03/05/2022.  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  Repeat CT 5/5 given ongoing sepsis physiology showed significant retroperitoneal contamination.  Taken back to the OR 5/6 with findings of significant infection status post creation of diverting colostomy and drain placement x3 as well as a Penrose drain in the peritoneal space.  Cultures grew E. coli, E faecalis, anaerobes.  Follow-up CT scan 5/12 showed edema/inflammation and soft tissue gas in the pelvic floor and presacral space with surgical drains in place.  He did have a 5.3 x 3.5 x 7.2 cm fluid collection of the left upper quadrant as well as intramuscular fluid/edema in the thighs left greater than right concerning for possible cellulitis/myositis.  IR was consulted for possible drainage of the left upper quadrant fluid collection, however, this was deemed too risky of an anatomical location to do so.  Another CT scan was obtained 5/14 was notable for overall stability in the pelvis.  The left upper quadrant fluid collection had decreased in size since the prior study and was felt to be contiguous with the left lower quadrant surgical drain. 5/22 repeat CT showed a new defined left pelvis wall 5cm fluid collection, with some improvement anterior to left kidney otherwise stable. He accidentally dislodged a left quadrant perc drain on 5/24 and was admitted to CIR on 5/26.  Subsequently developed fevers (101.6) on 5/26 and another CT 5/27  showed complex left retroperitoneal fluid collection with superior and inferior components that appear to communicate overlying the Gerota's fascia and left psoas musculature.  Status post IR aspiration 5/29 with 70m of purulent fluid and post-procedure CT demonstrated decompression of abscess cavity.  Gram stain = GNR, GPC.  5/26 CT also showed additional presacral air and fluid collection containing one surgical drain, others removed and is similar in size, measuring 7.9 x 2.5 cm. Intramuscular left thigh fluid collection: CT 5/14 noted progression of intramuscular fluid collections within the left thigh concerning for intramuscular abscesses.  He is s/p IR aspiration/drainage 5/16 with cultures growing Candida albicans.  Repeat CT of thigh planned for first week of June.  RECOMMENDATIONS:    Continue Zosyn Continue Micafungin Follow aspiration cultures Wound care Nutrition Will follow   Principal Problem:   Abscess, intra-abdominal, postoperative Active Problems:   Debility   Sepsis due to Enterococcus ([place]   E coli infection    MEDICATIONS:    Scheduled Meds:  (feeding supplement) PROSource Plus  30 mL Oral BID BM   vitamin C  500 mg Oral BID   bisoprolol  10 mg Oral QHS   Chlorhexidine Gluconate Cloth  6 each Topical Daily   enoxaparin (LOVENOX) injection  40 mg Subcutaneous Q24H   famotidine  20 mg Oral BID   feeding supplement  237 mL Oral TID BM   ferrous sulfate  325 mg Oral BID WC   lip balm  1 application. Topical BID   loperamide  2 mg Oral TID with meals   methocarbamol  1,000 mg Oral TID   montelukast  10 mg Oral QHS   multivitamin with minerals  1 tablet Oral Daily   pantoprazole  40 mg Oral Daily   polycarbophil  625 mg Oral BID   sodium chloride flush  10-40 mL Intracatheter Q12H   Continuous Infusions:  micafungin (MYCAMINE) IV Stopped (04/12/22 1538)   ondansetron (ZOFRAN) IV     piperacillin-tazobactam (ZOSYN)  IV 3.375 g (04/13/22 0739)   PRN  Meds:.acetaminophen, albuterol, alum & mag hydroxide-simeth, azelastine, ibuprofen, LORazepam, ondansetron (ZOFRAN) IV **OR** ondansetron (ZOFRAN) IV, ondansetron, simethicone, sodium chloride flush, traMADol, traZODone  SUBJECTIVE:   24 hour events:  Tmax 100.4 WBC 6.4 Creatinine 1.0 Cx from 5/29 = GNR, GPC on gram stain   He is up on the rehab floor.  Getting ready to work with therapy.  He feels better today.  Review of Systems  All other systems reviewed and are negative.    OBJECTIVE:   Blood pressure 129/87, pulse 100, temperature 98.1 F (36.7 C), temperature source Oral, resp. rate 16, height '5\' 9"'$  (1.753 m), weight 75.1 kg, SpO2 98 %. Body mass index is 24.45 kg/m.  Physical Exam Constitutional:      General: He is not in acute distress.    Appearance: Normal appearance.  HENT:     Head: Normocephalic and atraumatic.  Eyes:     Extraocular Movements: Extraocular movements intact.     Conjunctiva/sclera: Conjunctivae normal.  Abdominal:     General: There is no distension.     Palpations: Abdomen is soft.     Comments: Colostomy in place. Surgical drain in place x 2.   Genitourinary:    Comments: Perineal drain x 1 Musculoskeletal:        General: Normal range of motion.     Cervical back: Normal range of motion and neck supple.     Right lower leg: No edema.     Left lower leg: No edema.  Skin:    General: Skin is warm and dry.     Findings: No rash.  Neurological:     General: No focal deficit present.     Mental Status: He is alert and oriented to person, place, and time.  Psychiatric:        Mood and Affect: Mood normal.        Behavior: Behavior normal.     Lab Results: Lab Results  Component Value Date   WBC 6.3 04/12/2022   HGB 8.0 (L) 04/12/2022   HCT 23.8 (L) 04/12/2022   MCV 92.2 04/12/2022   PLT 310 04/12/2022    Lab Results  Component Value Date   NA 135 04/13/2022   K 4.0 04/13/2022   CO2 26 04/13/2022   GLUCOSE 105 (H)  04/13/2022   BUN 10 04/13/2022   CREATININE 1.00 04/13/2022   CALCIUM 8.4 (L) 04/13/2022   GFRNONAA >60 04/13/2022   GFRAA  03/23/2022    QUESTIONABLE RESULTS, RECOMMEND RECOLLECT TO VERIFY    Lab Results  Component Value Date   ALT 36 04/13/2022   AST 28 04/13/2022   ALKPHOS 287 (H) 04/13/2022   BILITOT 0.5 04/13/2022    No results found for: CRP  No results found for: ESRSEDRATE   I have reviewed the micro and lab results in Epic.  Imaging: CT ASPIRATION  Result Date: 04/12/2022 INDICATION: Abdominal abscess EXAM: CT-guided aspiration of abdominal abscess TECHNIQUE: Multidetector CT imaging of the abdomen was performed following the standard protocol without IV contrast. RADIATION  DOSE REDUCTION: This exam was performed according to the departmental dose-optimization program which includes automated exposure control, adjustment of the mA and/or kV according to patient size and/or use of iterative reconstruction technique. MEDICATIONS: The patient is currently admitted to the hospital and receiving intravenous antibiotics. The antibiotics were administered within an appropriate time frame prior to the initiation of the procedure. ANESTHESIA/SEDATION: Moderate (conscious) sedation was employed during this procedure. A total of Versed 2 mg and Fentanyl 100 mcg was administered intravenously by the radiology nurse. Total intra-service moderate Sedation Time: 10 minutes. The patient's level of consciousness and vital signs were monitored continuously by radiology nursing throughout the procedure under my direct supervision. COMPLICATIONS: None immediate. PROCEDURE: Informed written consent was obtained from the patient after a thorough discussion of the procedural risks, benefits and alternatives. All questions were addressed. Maximal Sterile Barrier Technique was utilized including caps, mask, sterile gowns, sterile gloves, sterile drape, hand hygiene and skin antiseptic. A timeout was  performed prior to the initiation of the procedure. The patient was placed supine on the exam table. Limited CT of the abdomen was performed for planning purposes. This again demonstrated a small collection in the left lower quadrant. Skin entry site was marked, the overlying skin was prepped and draped in a standard sterile fashion. Local analgesia was obtained with 1% lidocaine. Using intermittent CT fluoroscopy, a 19 gauge Yueh catheter was advanced towards the identified left lower quadrant collection. Location was confirmed with CT and return of purulent material. Primary aspiration was performed as the collection was felt to be too small for safe drain placement. A total of 15 mL of purulent material was aspirated, a sample was sent to the lab for analysis. Postprocedure CT demonstrated no hematoma common appropriate decompression of the abscess cavity. A clean dressing was placed. The patient tolerated the procedure well without immediate complication. IMPRESSION: Successful CT-guided aspiration of left lower quadrant abscess, with removal of 15 mL of purulent material. Sample sent to the lab for microbiology analysis. Electronically Signed   By: Albin Felling M.D.   On: 04/12/2022 10:43     Imaging independently reviewed in Epic.    [facility] [phone] pager 04/13/2022, 10:59 AM  I have personally spent 50 minutes involved in face-to-face and non-face-to-face activities for this patient on the day of the visit. Professional time spent includes the following activities: Preparing to see the patient (review of tests), Obtaining and/or reviewing separately obtained history (admission/discharge record), Performing a medically appropriate examination and/or evaluation , Ordering medications/tests/procedures, referring and communicating with other health care professionals, Documenting clinical information in the EMR,  Independently interpreting results (not separately reported), Communicating results to the patient/family/caregiver, Counseling and educating the patient/family/caregiver and Care coordination (not separately reported).

## 2022-04-13 NOTE — Consult Note (Signed)
[facility] Nurse Consult Note  WOC Nurse Wound Consult Note  Reason for Consult: Left lower buttock wound at site of previous drain Wound type: full thickness Pressure Injury POA: N/A Measurement: 4cm x 1.4cm x 0.2cm Wound bed:red, moist Drainage (amount, consistency, odor) moderate serous  Periwound:macerated, erythematous Dressing procedure/placement/frequency: I will provide guidance for Nursing in the care of this wound using a daily soap and water cleanse followed by covering the lesion with a size appropriate piece of silver hydrofiber dressing (Aquacel Ag+ Advantage, [place] # [id number]) topping with dry gauze and securing with a silicone foam. A pressure redistribution chair pad is provided for chairs other than the wheelchair and for home use.  [facility] Nurse ostomy Consult Note Stoma type/location: LLQ Colostomy Stomal assessment/size: 1 and 3/8 inch round stoma with lumen pointing toward 9 o'clock. Current pouching system is oriented to the left side, making capacity fill volume less than desired. Peristomal assessment: intact Treatment options for stomal/peristomal skin: skin barrier rings, one to encircle stoma and 1/2 placed from 7-11 o'clock to provide level plane for pouching Output: brown soft stool Ostomy pouching: 1pc.pouch with convexity, Kellie Simmering # [id number], and 1.5 rings, Kellie Simmering # [phone]. Patient would prefer not to wear belt today; he has a belt Kellie Simmering # [id number]) available as needed in the room. Recommend belt usage for the first 12 hours post pouch application to seed adhesives. Education provided:  Patient and this Probation officer review the steps to pouch application as I am doing them: Pouch removal, stoma cleansing, skin inspection, stoma sizing, pouch preparation, application of rings and application of pouch. I demonstrate a way to fold the tape adhesive strips prior to pouch placement to make removal easier. Patient is able to close pouch and open pouch using Lock and Roll closure feature. I  reiterate cleansing and patient understands the rationale for cleansing the bottom 2 inches of the pouching system with toilet paper prior to resealing. Enrolled patient in [place] program: Yes, previously    Supplies are in room.  Next pouch change is planned for Thursday or Friday, 6/1 or 6/2. Goal is to change pouch prior to leakage, not when a pouch leaks.  [facility] nursing team will follow, and will remain available to this patient, the nursing and medical teams.   Thanks, Maudie Flakes, MSN, RN, [place], Arther Abbott  Pager# [phone]

## 2022-04-13 NOTE — Progress Notes (Signed)
Occupational Therapy Session Note  Patient Details  Name: Tyler [name] MRN: [id number] Date of Birth: 19-Apr-1951  Today's Date: 04/13/2022 OT Individual Time: 0907-1000 OT Individual Time Calculation (min): 53 min    Short Term Goals: Week 1:  OT Short Term Goal 1 (Week 1): Pt will complete grooming sink side level standing up to 4 minute intervals with brief intermittent rests seated as needed with close S OT Short Term Goal 2 (Week 1): Pt will complete toilet and shower bench transfers with CGA using grab bars OT Short Term Goal 3 (Week 1): Pt will complete UB bathing and dressing with close S after set up OT Short Term Goal 4 (Week 1): Pt will complete LB bathing and dressing using AE as needed with min A OT Short Term Goal 5 (Week 1): Pt will verbally identify, express and employ 3/3 energy conservation strategies during ADL's, functional mobility and light UE ther ex//act with min VC's.  Skilled Therapeutic Interventions/Progress Updates:    Pt received in hand off from OT finishing self care.  Pt requested to wash hair. Obtained shampoo tray and assisted pt with washing his hair. He then dried it with hair dryer and combed it.   Pt tried to doff socks by cross ankle over knee but unable to due to tightness. He stated he used to be able to do this.  With each leg worked on active internal rotation bringing foot up towards knee and then static stretch.  Explained that he can learn to use AE for LB dressing but this first week we should focus on flexibility. Total A to don socks and shoes.    With shoes on, pt worked on sit to stand from [employer] to [employer] with only S.  He needed 1 cue to push up with B hands.  He was very concerned he could not do it because he was feeling extremely fatigued. Set a goal of 30 seconds and he was able to accomplish standing for 30 sec 2x with RW support. Cues for relaxed breathing.    With shoes donned, pt able to self propel wc with his feet.  Pt worked on going  down hallway with his feet for LE strengthening.      Pt stated this was quite fatiguing.  Hand off to PT for his next session.    Therapy Documentation Precautions:  Precautions Precautions: Fall Precaution Comments: drains, colostomy, rectal drainage Restrictions Weight Bearing Restrictions: No    Vital Signs: Therapy Vitals Temp: 98.1 F (36.7 C) Temp Source: Oral Pulse Rate: 100 Resp: 16 BP: 129/87 Patient Position (if appropriate): Lying Oxygen Therapy SpO2: 98 % Pain:  Hip pain with trying to cross legs    Therapy/Group: Individual Therapy  [place] 04/13/2022, 8:25 AM

## 2022-04-14 DIAGNOSIS — R339 Retention of urine, unspecified: Secondary | ICD-10-CM

## 2022-04-14 DIAGNOSIS — S31809D Unspecified open wound of unspecified buttock, subsequent encounter: Secondary | ICD-10-CM

## 2022-04-14 MED ORDER — AMOXICILLIN-POT CLAVULANATE 875-125 MG PO TABS
1.0000 | ORAL_TABLET | Freq: Two times a day (BID) | ORAL | Status: DC
  Administered 2022-04-14 – 2022-04-19 (×11): 1 via ORAL
  Filled 2022-04-14 (×12): qty 1

## 2022-04-14 MED ORDER — SODIUM CHLORIDE 0.9 % IV SOLN
2.0000 g | INTRAVENOUS | Status: DC
  Administered 2022-04-14 – 2022-04-19 (×6): 2 g via INTRAVENOUS
  Filled 2022-04-14 (×6): qty 20

## 2022-04-14 NOTE — Progress Notes (Signed)
Brief ID Note:  Left flank abscess cultures from 5/29 resulted with E coli that is resistant to Zosyn.  Will change from Zosyn to Ceftriaxone to cover this E coli.  Will add Augmentin PO for continued anaerobic coverage and to maintain Enterococcus coverage although this may be adequately treated at this point.  Will continue Micafungin for Candida coverage in setting of fluconazole allergy.  Will follow.   [facility] 04/14/2022, 11:41 AM

## 2022-04-14 NOTE — Progress Notes (Addendum)
PROGRESS NOTE   Subjective/Complaints:   In bed this AM. Reports his breathing is improved. WOC completed yesterday. No additional concerns.    Review of Systems  Constitutional:        Fever and diaphoresis overnight 04/09/22-04/10/22 have resolved.  HENT:  Negative for congestion and sinus pain.   Eyes: Negative.   Respiratory:  Positive for cough. Negative for shortness of breath.        Cough and shortness of breath overnight 04/09/22-04/10/22 have resolved.  Cardiovascular:  Negative for chest pain and palpitations.  Gastrointestinal:  Negative for nausea and vomiting.  Genitourinary:  Positive for urgency. Negative for dysuria.  Musculoskeletal:  Negative for myalgias.  Neurological:  Negative for dizziness, focal weakness, weakness and headaches.  Endo/Heme/Allergies: Negative.   Psychiatric/Behavioral:  Negative for depression. The patient is nervous/anxious. The patient does not have insomnia.     Objective:   CT ASPIRATION  Result Date: 04/12/2022 INDICATION: Abdominal abscess EXAM: CT-guided aspiration of abdominal abscess TECHNIQUE: Multidetector CT imaging of the abdomen was performed following the standard protocol without IV contrast. RADIATION DOSE REDUCTION: This exam was performed according to the departmental dose-optimization program which includes automated exposure control, adjustment of the mA and/or kV according to patient size and/or use of iterative reconstruction technique. MEDICATIONS: The patient is currently admitted to the hospital and receiving intravenous antibiotics. The antibiotics were administered within an appropriate time frame prior to the initiation of the procedure. ANESTHESIA/SEDATION: Moderate (conscious) sedation was employed during this procedure. A total of Versed 2 mg and Fentanyl 100 mcg was administered intravenously by the radiology nurse. Total intra-service moderate Sedation Time: 10  minutes. The patient's level of consciousness and vital signs were monitored continuously by radiology nursing throughout the procedure under my direct supervision. COMPLICATIONS: None immediate. PROCEDURE: Informed written consent was obtained from the patient after a thorough discussion of the procedural risks, benefits and alternatives. All questions were addressed. Maximal Sterile Barrier Technique was utilized including caps, mask, sterile gowns, sterile gloves, sterile drape, hand hygiene and skin antiseptic. A timeout was performed prior to the initiation of the procedure. The patient was placed supine on the exam table. Limited CT of the abdomen was performed for planning purposes. This again demonstrated a small collection in the left lower quadrant. Skin entry site was marked, the overlying skin was prepped and draped in a standard sterile fashion. Local analgesia was obtained with 1% lidocaine. Using intermittent CT fluoroscopy, a 19 gauge Yueh catheter was advanced towards the identified left lower quadrant collection. Location was confirmed with CT and return of purulent material. Primary aspiration was performed as the collection was felt to be too small for safe drain placement. A total of 15 mL of purulent material was aspirated, a sample was sent to the lab for analysis. Postprocedure CT demonstrated no hematoma common appropriate decompression of the abscess cavity. A clean dressing was placed. The patient tolerated the procedure well without immediate complication. IMPRESSION: Successful CT-guided aspiration of left lower quadrant abscess, with removal of 15 mL of purulent material. Sample sent to the lab for microbiology analysis. Electronically Signed   By: Albin Felling M.D.   On: 04/12/2022 10:43  Recent Labs    04/12/22 0644  WBC 6.3  HGB 8.0*  HCT 23.8*  PLT 310    Recent Labs    04/12/22 0644 04/13/22 0420  NA 132* 135  K 3.4* 4.0  CL 99 103  CO2 25 26  GLUCOSE 98 105*   BUN 8 10  CREATININE 1.00 1.00  CALCIUM 8.2* 8.4*     Intake/Output Summary (Last 24 hours) at 04/14/2022 0742 Last data filed at 04/14/2022 0038 Gross per 24 hour  Intake 240 ml  Output 910 ml  Net -670 ml      Pressure Injury 04/09/22 Heel Left Deep Tissue Pressure Injury - Purple or maroon localized area of discolored intact skin or blood-filled blister due to damage of underlying soft tissue from pressure and/or shear. (Active)  04/09/22 1315  Location: Heel  Location Orientation: Left  Staging: Deep Tissue Pressure Injury - Purple or maroon localized area of discolored intact skin or blood-filled blister due to damage of underlying soft tissue from pressure and/or shear.  Wound Description (Comments):   Present on Admission: Yes     Pressure Injury 04/09/22 Heel Right Deep Tissue Pressure Injury - Purple or maroon localized area of discolored intact skin or blood-filled blister due to damage of underlying soft tissue from pressure and/or shear. (Active)  04/09/22 1315  Location: Heel  Location Orientation: Right  Staging: Deep Tissue Pressure Injury - Purple or maroon localized area of discolored intact skin or blood-filled blister due to damage of underlying soft tissue from pressure and/or shear.  Wound Description (Comments):   Present on Admission: Yes    Physical Exam: Vital Signs Blood pressure (!) 125/92, pulse 93, temperature 98.4 F (36.9 C), temperature source Oral, resp. rate 18, height '5\' 9"'$  (1.753 m), weight 75.1 kg, SpO2 97 %.   General: Alert and oriented x 3, No apparent distress, in bed, appears comfortable HEENT: Head is normocephalic, atraumatic, PERRLA, EOMI, sclera anicteric, oral mucosa pink and moist,  Neck: Supple Heart: RRR, no MRG Chest: CTA bilaterally without wheezes, rales, or rhonchi; no distress Abdomen: Soft, minimal tenderness, bowel sounds positive. Colostomy viable with stool. Comments: Pelvis JP drain x2 with small amount of  yellow-white drainage. Skin: Slightly erythematous, but skin intact at former drain site. Stoma aroma colostomy intact. Heel protectors are on both heels. There is a small area of redness on lateral aspect of left heel and medial aspect of right heel.   Buttocks Left wound covered with dressing Extremities: No clubbing, cyanosis, or edema. Pulses are 2+,  Neuro: Patient is alert and oriented x3, sitting up in bed, follows commands, No focal deficits. Musculoskeletal: FROM all 4 extremities , no joint swelling Strength: 4+/5 overall, BUE and BLE.  Sensory: intact to LT in all 4 extremities Psych: Pt's affect is appropriate. Pt is cooperative. Pleasant  Assessment/Plan: 1. Functional deficits which require 3+ hours per day of interdisciplinary therapy in a comprehensive inpatient rehab setting. Physiatrist is providing close team supervision and 24 hour management of active medical problems listed below. Physiatrist and rehab team continue to assess barriers to discharge/monitor patient progress toward functional and medical goals  Care Tool:  Bathing    Body parts bathed by patient: Right arm, Left arm, Chest, Abdomen, Face, Front perineal area, Right upper leg, Left upper leg   Body parts bathed by helper: Buttocks, Right lower leg, Left lower leg     Bathing assist Assist Level: Moderate Assistance - Patient 50 - 74%  Upper Body Dressing/Undressing Upper body dressing   What is the patient wearing?: Hospital gown only    Upper body assist Assist Level: Contact Guard/Touching assist    Lower Body Dressing/Undressing Lower body dressing      What is the patient wearing?: Incontinence brief     Lower body assist Assist for lower body dressing: Maximal Assistance - Patient 25 - 49%     Toileting Toileting    Toileting assist Assist for toileting: Minimal Assistance - Patient > 75% (urinal use- has colostomy with total A for care)     Transfers Chair/bed  transfer  Transfers assist     Chair/bed transfer assist level: Contact Guard/Touching assist     Locomotion Ambulation   Ambulation assist      Assist level: Minimal Assistance - Patient > 75% Assistive device: Walker-rolling Max distance: 15   Walk 10 feet activity   Assist     Assist level: Minimal Assistance - Patient > 75% Assistive device: Walker-rolling   Walk 50 feet activity   Assist Walk 50 feet with 2 turns activity did not occur: Safety/medical concerns         Walk 150 feet activity   Assist Walk 150 feet activity did not occur: Safety/medical concerns         Walk 10 feet on uneven surface  activity   Assist Walk 10 feet on uneven surfaces activity did not occur: Safety/medical concerns         Wheelchair     Assist Is the patient using a wheelchair?: Yes Type of Wheelchair: Manual    Wheelchair assist level: Dependent - Patient 0% Max wheelchair distance: 150    Wheelchair 50 feet with 2 turns activity    Assist        Assist Level: Dependent - Patient 0%   Wheelchair 150 feet activity     Assist      Assist Level: Dependent - Patient 0%   Blood pressure (!) 125/92, pulse 93, temperature 98.4 F (36.9 C), temperature source Oral, resp. rate 18, height '5\' 9"'$  (1.753 m), weight 75.1 kg, SpO2 97 %.   Medical Problem List and Plan: 1. Functional deficits secondary to debility secondary to pelvic abscess.  Status post laparoscopy, abdominal washout drain placement with creation of diverting colostomy 03/20/2022             -patient may shower but incisions/drains should be covered             -ELOS/Goals: 10-14 days modI             -Continue PT/OT  -Walked 50 feet with PT 2.  Antithrombotics: -DVT/anticoagulation:  Pharmaceutical: Lovenox.  Venous Doppler negative             -antiplatelet therapy: N/A 5/27 Continue Lovenox 3. Pain Management: Robaxin 1000 mg 3 times daily, tramadol as needed 5/31- Pain  continues to be controlled, continue PRN tramadol 4. Mood: Ativan as needed as well as trazodone             -antipsychotic agents: N/A 5. Neuropsych: This patient is capable of making decisions on his own behalf. 6. Skin/Wound Care: Routine skin checks with colostomy education 7. Fluids/Electrolytes/Nutrition: Routine in and outs with follow-up chemistries 5/27 Slightly low sodium (133) and potassium (3.4), continue to trend, consider repleting 5/30 Na up to 135 and K wnl after repletion  BMET    Component Value Date/Time   NA 135 04/13/2022 0420   K 4.0 04/13/2022  0420   CL 103 04/13/2022 0420   CO2 26 04/13/2022 0420   GLUCOSE 105 (H) 04/13/2022 0420   BUN 10 04/13/2022 0420   CREATININE 1.00 04/13/2022 0420   CALCIUM 8.4 (L) 04/13/2022 0420   GFRNONAA >60 04/13/2022 0420     8.  ID/intra-abdominal infection.  Continue micafungin 100 mg daily as well as Zosyn 3.375 g every 8 hours.  Plan is for 6 weeks starting date 5/16 ending 05/11/2022 and will need repeat CT abdomen/pelvis on left thigh in 1 to 2 weeks -5/27 Pt has event last night on 5/26 of temp 101.6, HR 115, new onset cough, with shortness of breath.  Per night nurse on 5/26-5/27 became diaphoretic around 0000 on 5/27 and drenched the bed with sweat, with no subsequent fevers or episodes of tachycardia. Orders were placed for CBC, BMET, blood cultures x2, CXR, COVID, respiratory panel, and repeat CT abd/pelvis. ID was consulted the evening of 5/26 with recommendation given to continue current regimen of 6 week Zosyn and micafungin based on original plan.  Per [place], CT abdomen/pelvis indicated with febrile event, especially in light that patient's last CT scan was 04/05/22 and he inadvertently lost one of his JP drains on the morning of 04/06/22. -Respiratory panel and COVID negative.  CXR shows Small left pleural effusion with atelectasis at the left lung base. -CT abd/pelvis: demonstrates a complex left retroperitoneal  fluid collection, with superior and inferior components that appear to communicate overlying the Gerota's fascia and left psoas musculature. ID to round on pt on 04/11/22 and will decide next steps. 04/11/22 See ID and Radiology note.  IR was consulted for intra-abdominal fluid collection. Dr. Denna Haggard (IR) has approved patient for aspiration of inferior portion of fluid collection. -Per Radiology note, PA will reach out to General Surgery (Dr. Marcello Moores) to notify of plan. Also, called Dr. Rosendo Gros, MD on-call for General Surgery this weekend to update office on patient since surgery signed off on 04/09/22.  I was unable to reach provider since he was in-procedure.  But relayed message by telephone to nurse, Karena Addison, [place] of patient's febrile episode 04/09/22-04/10/22 and plans for abdominal fluid removal and possible drain placement by IR. 5/29 IR completed aspiration of 43m of purulent material, no drain placed 5/29 ID continuing Zosyn, micafungin 5/31 culture of L flank abscess with GNR and gram pos cocci 9.  Postoperative anemia.  Continue iron supplement.  Follow-up CBC 5/28 Hgb 8.2 low but stable, continue to trend and monitor. 5/29 Hgb stable at 8.0, follow     Latest Ref Rng & Units 04/12/2022    6:44 AM 04/10/2022    2:50 AM 04/09/2022    3:45 PM  CBC  WBC 4.0 - 10.5 K/uL 6.3   5.5   4.3    Hemoglobin 13.0 - 17.0 g/dL 8.0   8.2   8.1    Hematocrit 39.0 - 52.0 % 23.8   23.1   24.1    Platelets 150 - 400 K/uL 310   301   296      10.  Hypertension.  Continue Zebeta 10 mg nightly,   Monitor with increased mobility 5/20 BP well controlled, continue to monitor, continue zebeta    04/14/2022    4:56 AM 04/13/2022    9:26 PM 04/13/2022    7:36 PM  Vitals with BMI  Systolic [id number] Diastolic 92 83 87  Pulse 93 103 110    11.  Decreased nutritional storage.  Dietary  follow-up. Continue Prosource.  12.  Urinary retention.  Foley tube removed and urinating.  Check PVR 5/27 Pt is voiding okay,  reported that he had some urinary incontinence for the first day or two immediately following Foley removal, which has since resolved. 5/31 reports no difficulty voiding, follow  13. Hypokalemia  -5/29KCL today, repeat labs  -5/30 K+ wnl now 14. Hypoalbuminemia  -Continue ensure supplementation  -Discussed healthy diet, He ate all his breakfast today 5/30 15. Wound at left lower buttocks site of previous drain  -5/20 WOC nurse consult recommends aquacel Ag with dry gauze and silicone foam. Continue to follow 16. Asthma,   -Using albuterol inhaler, monitor, improved today LOS: 5 days A FACE TO FACE EVALUATION WAS PERFORMED  Jennye Boroughs 04/14/2022, 7:42 AM

## 2022-04-14 NOTE — Patient Care Conference (Signed)
Inpatient RehabilitationTeam Conference and Plan of Care Update Date: 04/14/2022   Time: 12:04 PM    Patient Name: Tyler [name]      Medical Record Number: [id number]  Date of Birth: 11-May-1951 Sex: Male         Room/Bed: 4M12C/4M12C-01 Payor Info: Payor: [place] / Plan: [place] PPO / Product Type: *No Product type* /    Admit Date/Time:  04/09/2022  1:39 PM  Primary Diagnosis:  Abscess, intra-abdominal, postoperative  Hospital Problems: Principal Problem:   Abscess, intra-abdominal, postoperative Active Problems:   Debility   Sepsis due to Enterococcus [facility])   E coli infection    Expected Discharge Date: Expected Discharge Date: 04/23/22  Team Members Present: Physician leading conference: Dr. Jennye Boroughs Social Worker Present: Ovidio Kin, LCSW Nurse Present: Dorien Chihuahua, RN PT Present: Leavy Cella, PT OT Present: Jennefer Bravo, OT PPS Coordinator present : Gunnar Fusi, SLP     Current Status/Progress Goal Weekly Team Focus  Bowel/Bladder   Continent of Bladder. Colostomy in place. LBM 5/30  Remain continent  Assist with toileting needs as needed   Swallow/Nutrition/ Hydration             ADL's   set up UB self care, max A LB self care due to hip tightness and pain with bending forward; supervision sit to stand and stand pivot but standing tolerance very low 30 sec -2 min  mod I with most of his self care except for supervision toileting (colstomy bag) and homemaking/meal prep  ADL training, endurance, strength, pt education, relaxation strategies, counseling with realistic goal setting (pt gets very discouraged easily)   Mobility   Supervision transfers, CGA gait with RW up to 50 ft, limited by fatigue and anxiety  mod I transfers and gait, supervision stairs  endurance, gait   Communication             Safety/Cognition/ Behavioral Observations            Pain   Some complaints of pain on buttocks. Well controlled.  pain <3/10   Assess Qshift and prn   Skin   Bilateral deep tissue injuries to heels. JP drain x2 on abd. Colostomy LUQ.  Promote healing and prevention of infection  Assess q shift and prn     Discharge Planning:  HOme with partner who is here daily and participates in pt's care. Arranging home health for IV antibiotics and wound care along with continued therapies   Team Discussion: Patient declining pain medication but limited by fatigue, muscle tightness and pain. Reports better after breathing treatments. WOC following for ostomy care and skin care recommendations. Anticipate will need IV abx through 05/11/22.  Patient on target to meet rehab goals: yes, currently needs set up for upper body care and max assist for lower body care. Needs help with ostomy management  and skin care. Poor standing tolerance however able to ambulate up to 86' with CGA and supervision for sit - stand.  Goals for discharge set for mod I overall and supervision for mobility.  *See Care Plan and progress notes for long and short-term goals.   Revisions to Treatment Plan:  N/A   Teaching Needs: Medications, skin care, ostomy care, drain care/management, safety, etc.  Current Barriers to Discharge: Decreased caregiver support and Wound care  Possible Resolutions to Barriers: Family education Ostomy resource number IV abx/HH follow up services     Medical Summary Current Status: Ostomy, wounds, Pelvic abcess/infection, followed  by ID on ABX, hypokalemia, asthma,  Barriers to Discharge: IV antibiotics;Medical stability;Wound care  Barriers to Discharge Comments: Ostomy, wounds, Pelvic abcess/infection, followed by ID on ABX, hypokalemia, asthma, Possible Resolutions to [employer]: continue abx, monitor labs, wound care, replete K when needed, monitor asthma with breathing treatments   Continued Need for Acute Rehabilitation Level of Care: The patient requires daily medical management by a physician with  specialized training in physical medicine and rehabilitation for the following reasons: Direction of a multidisciplinary physical rehabilitation program to maximize functional independence : Yes Medical management of patient stability for increased activity during participation in an intensive rehabilitation regime.: Yes Analysis of laboratory values and/or radiology reports with any subsequent need for medication adjustment and/or medical intervention. : Yes   I attest that I was present, lead the team conference, and concur with the assessment and plan of the team.   Dorien Chihuahua B 04/14/2022, 4:18 PM

## 2022-04-14 NOTE — Progress Notes (Signed)
Occupational Therapy Session Note  Patient Details  Name: Tyler [name] MRN: [id number] Date of Birth: Dec 29, 1950  Today's Date: 04/14/2022 OT Individual Time: 0240-9735 OT Individual Time Calculation (min): 60 min   Today's Date: 04/14/2022 OT Individual Time: 1410-1435 OT Individual Time Calculation (min): 25 min   Short Term Goals: Week 1:  OT Short Term Goal 1 (Week 1): Pt will complete grooming sink side level standing up to 4 minute intervals with brief intermittent rests seated as needed with close S OT Short Term Goal 2 (Week 1): Pt will complete toilet and shower bench transfers with CGA using grab bars OT Short Term Goal 3 (Week 1): Pt will complete UB bathing and dressing with close S after set up OT Short Term Goal 4 (Week 1): Pt will complete LB bathing and dressing using AE as needed with min A OT Short Term Goal 5 (Week 1): Pt will verbally identify, express and employ 3/3 energy conservation strategies during ADL's, functional mobility and light UE ther ex//act with min VC's.  Skilled Therapeutic Interventions/Progress Updates:    Session 1: Pt received in bed with no pain and agreeable to OT ADL session. Overall focus on adaptive strategies and AE training with demo of LHSS, foot funnel, reacher and sock aide. Pt able to doff and don socks with S, bathe with S using LHSS to wash feet and back and pt declines washing buttocks/peri area. Pt completes all transfers with CGA mostly to keep brief from falling with VC for power up with BUE pushing from EOB. Pt completes ADL at sink donning new hopspital gown with A to fasten. Pt with fatigue throughout requiring increased time for rest breaks. Exited session with pt seated in w/c at sink shaving,  and call light in reach. Next PT scheduled to come in shortly    Session 2: Pt received in bed initially getting up with CGA after total A to don shoes d/t time, but then needing to go back to bed to use urinal. Once again pt completed  bed mobility in semi fowlers to EOB with S and STS with RW and CGA. Pt completes functional mobility in hallway with RW and VC for looking forward, deep breathing and keeping shoulder down and back 1x230' and 1x105 feet. Pt very proud of self. Exited session with pt seated in bed, exit alarm on and call light in reach   Therapy Documentation Precautions:  Precautions Precautions: Fall Precaution Comments: drains, colostomy, rectal drainage Restrictions Weight Bearing Restrictions: No General:   Vital Signs: Therapy Vitals Temp: 98.4 F (36.9 C) Temp Source: Oral Pulse Rate: 93 Resp: 18 BP: (!) 125/92 Patient Position (if appropriate): Sitting Oxygen Therapy SpO2: 97 % O2 Device: Room Air Pain:   ADL: ADL Eating: Set up Where Assessed-Eating: Chair Grooming: Minimal assistance Where Assessed-Grooming: Edge of bed Upper Body Bathing: Minimal assistance Where Assessed-Upper Body Bathing: Edge of bed Lower Body Bathing: Maximal assistance Where Assessed-Lower Body Bathing: Edge of bed Upper Body Dressing: Moderate assistance Where Assessed-Upper Body Dressing: Edge of bed Lower Body Dressing: Maximal assistance Where Assessed-Lower Body Dressing: Edge of bed Toileting: Maximal assistance (urinal use with min a and Dep for colostomy management) Toilet Transfer: Not assessed (pt transferred bed t recliner via walker. Used urinal wiht OT and has colostomy for bowl mngt currently) Tub/Shower Transfer: Not assessed Social research officer, government: Not assessed (will assess next session due to pt fearing lightheadedness) ADL Comments: Pt feared episode of lightheadedness and drop in BP as per  last therapy visit and had been in hospital bed x 2 days. OT worked with pt bed level, EOB and then transferred via RW SPT taking 4 steps for ADL routine assessment, pt using urinal for voiding and has colostomy for bowel mngt, VSS throughout session, difficulty with LE reach and overall activity  tolerance requiring frequenct rests and cues for pacing and breathing Vision   Perception    Praxis   Balance   Exercises:   Other Treatments:     Therapy/Group: Individual Therapy  Tonny Branch 04/14/2022, 6:53 AM

## 2022-04-14 NOTE — Progress Notes (Signed)
On call Dan , [place] notified of VS, no pain Tylenol po administered

## 2022-04-14 NOTE — Progress Notes (Signed)
Patient ID: Tyler [name], male   DOB: 07/21/1951, 71 y.o.   MRN: [id number]  Met with pt to give him a team conference update his goals are mod/I-supervision and target discharge date is 6/9. He feels is doing ok he minimizes how well he is doing and focuses on the negative. He is glad to be walking and needs to work on his standing. Informed have found a home health agenyc to accept his IV antibiotics and wound issues along with therapy needs. Made aware his partner-[name] will need be educated prior to discharge on his care needs. Will discuss with [name] and schedule education for next week. Continue to encourage pt in therapies with how well he is doing. 

## 2022-04-14 NOTE — Progress Notes (Signed)
Occupational Therapy Session Note  Patient Details  Name: Tyler [name] MRN: [id number] Date of Birth: September 24, 1951  Today's Date: 04/14/2022 OT Individual Time: 1100-1200 OT Individual Time Calculation (min): 60 min    Short Term Goals: Week 1:  OT Short Term Goal 1 (Week 1): Pt will complete grooming sink side level standing up to 4 minute intervals with brief intermittent rests seated as needed with close S OT Short Term Goal 2 (Week 1): Pt will complete toilet and shower bench transfers with CGA using grab bars OT Short Term Goal 3 (Week 1): Pt will complete UB bathing and dressing with close S after set up OT Short Term Goal 4 (Week 1): Pt will complete LB bathing and dressing using AE as needed with min A OT Short Term Goal 5 (Week 1): Pt will verbally identify, express and employ 3/3 energy conservation strategies during ADL's, functional mobility and light UE ther ex//act with min VC's.  Skilled Therapeutic Interventions/Progress Updates:    1:1 Pt received in bed and discussion of bilateral LEs and pain in hamstrings.   Therapeutic exercises: with focus on stretching and strengthening in supine in bed include; hamstring stretches, hip extension in sidelying, clam shell hip abduction, leg lifts in sidelying, calf stretches and stretching in modified figure four position   Transitioned from flat bed into sitting with supervision with extra time. Pt ambulated from EOB down hallway towards gym - going ~100 feet with RW with close supervision before needing to sit. Pt sat in w/c with extra time. Pt stand pivot back to bed without RW with contact guard (stepping a few steps). Transition back into bed with supervision.   Pt did require total A to don and doff his shoes.   Therapy Documentation Precautions:  Precautions Precautions: Fall Precaution Comments: drains, colostomy, rectal drainage Restrictions Weight Bearing Restrictions: No  Pain: Ongoing pain in bilateral hamstrings  - MD/RN aware; encourage meds as prescribed and stretched.  Therapy/Group: Individual Therapy  Willeen Cass [facility] 04/14/2022, 2:16 PM

## 2022-04-14 NOTE — Progress Notes (Signed)
Physical Therapy Session Note  Patient Details  Name: Tyler [name] MRN: [id number] Date of Birth: February 16, 1951  Today's Date: 04/14/2022 PT Individual Time: 0920-0955 PT Individual Time Calculation (min): 35 min   Short Term Goals: Week 1:  PT Short Term Goal 1 (Week 1): Pt will transfer to bed with CGA PT Short Term Goal 2 (Week 1): Pt will ambulate 51f with CGA PT Short Term Goal 3 (Week 1): Pt will propell WC 1035fwith supervision assist PT Short Term Goal 4 (Week 1): Pt will ascend 2 steps with min assist and BUE Support  Skilled Therapeutic Interventions/Progress Updates: Pt presented in w/c completing shaving at sink and agreeable to therapy. Pt denies pain at rest but does express increased fatigue as has been sitting in w/c for approx 1hr post therapy. Pt unable to hold RW but able to hold IV pole to be transported to day room. In day room pt ambulated ~3019f 2 with RW with CGA and extended seated rest between bouts. Pt propelled w/c ~56f80fing BLE only and intermittent use of BUE. Pt then indicated too fatigued to participate further and requesting to return to room. Pt transported back to room and performed ambulatory transfer to bed with supervision and transferred to supine with supervision. Pt left in bed at end of session with bed alarm on, call bell within reach and needs met.      Therapy Documentation Precautions:  Precautions Precautions: Fall Precaution Comments: drains, colostomy, rectal drainage Restrictions Weight Bearing Restrictions: No General: PT Amount of Missed Time (min): 10 Minutes PT Missed Treatment Reason: Patient fatigue Vital Signs:  Pain:   Mobility:   Locomotion :    Trunk/Postural Assessment :    Balance:   Exercises:   Other Treatments:      Therapy/Group: Individual Therapy  Marjoria Mancillas 04/14/2022, 12:40 PM

## 2022-04-15 DIAGNOSIS — I10 Essential (primary) hypertension: Secondary | ICD-10-CM

## 2022-04-15 DIAGNOSIS — J4520 Mild intermittent asthma, uncomplicated: Secondary | ICD-10-CM

## 2022-04-15 LAB — CULTURE, BLOOD (ROUTINE X 2)
Culture: NO GROWTH
Culture: NO GROWTH

## 2022-04-15 LAB — CBC
HCT: 24.3 % — ABNORMAL LOW (ref 39.0–52.0)
Hemoglobin: 8.1 g/dL — ABNORMAL LOW (ref 13.0–17.0)
MCH: 30.7 pg (ref 26.0–34.0)
MCHC: 33.3 g/dL (ref 30.0–36.0)
MCV: 92 fL (ref 80.0–100.0)
Platelets: 304 10*3/uL (ref 150–400)
RBC: 2.64 MIL/uL — ABNORMAL LOW (ref 4.22–5.81)
RDW: 14.8 % (ref 11.5–15.5)
WBC: 9.4 10*3/uL (ref 4.0–10.5)
nRBC: 0 % (ref 0.0–0.2)

## 2022-04-15 NOTE — Progress Notes (Signed)
[facility] for Infectious Disease  Date of Admission:  04/09/2022           Reason for visit: Follow up on intra abdominal infection   Current antibiotics: Ceftriaxone Augmentin Micafungin    ASSESSMENT:    71 y.o. male admitted with:  Intra-abdominal infection: Patient admitted with sepsis following initial transanal hemorrhoidal dearterialization 03/05/2022.  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  Repeat CT 5/5 given ongoing sepsis physiology showed significant retroperitoneal contamination.  Taken back to the OR 5/6 with findings of significant infection status post creation of diverting colostomy and drain placement x3 as well as a Penrose drain in the peritoneal space.  Cultures grew E. coli, E faecalis, anaerobes.  Follow-up CT scan 5/12 showed edema/inflammation and soft tissue gas in the pelvic floor and presacral space with surgical drains in place.  He did have a 5.3 x 3.5 x 7.2 cm fluid collection of the left upper quadrant as well as intramuscular fluid/edema in the thighs left greater than right concerning for possible cellulitis/myositis.  IR was consulted for possible drainage of the left upper quadrant fluid collection, however, this was deemed too risky of an anatomical location to do so.  Another CT scan was obtained 5/14 was notable for overall stability in the pelvis.  The left upper quadrant fluid collection had decreased in size since the prior study and was felt to be contiguous with the left lower quadrant surgical drain. 5/22 repeat CT showed a new defined left pelvis wall 5cm fluid collection, with some improvement anterior to left kidney otherwise stable. He accidentally dislodged a left quadrant perc drain on 5/24 and was admitted to CIR on 5/26.  Subsequently developed fevers (101.6) on 5/26  and another CT 5/27 showed complex left retroperitoneal fluid collection with superior and inferior components that appear to communicate overlying the Gerota's fascia and left psoas musculature.  Status post IR aspiration 5/29 with 56m of purulent fluid and post-procedure CT demonstrated decompression of abscess cavity.  Gram stain = GNR, GPC and cultures growing E coli (R to Zosyn).  5/26 CT also showed additional presacral air and fluid collection containing one surgical drain, others removed and is similar in size, measuring 7.9 x 2.5 cm. Intramuscular left thigh fluid collection: CT 5/14 noted progression of intramuscular fluid collections within the left thigh concerning for intramuscular abscesses.  He is s/p IR aspiration/drainage 5/16 with cultures growing Candida albicans.  Repeat CT of thigh planned for first week of June.  RECOMMENDATIONS:    Continue Ceftriaxone Continue Augmentin Continue Micafungin Monitor fevers.  Possible that spiked fever as the E coli growing from 5/29 cx was not adequately treated with Zosyn If fevers persist, would suggest getting repeat CT abd/pelvis and femur to reassess fluid collections Follow cultures Lab monitoring Wound care Will follow   Principal Problem:   Abscess, intra-abdominal, postoperative Active Problems:   Debility   Sepsis due to Enterococcus ([place]   E coli infection    MEDICATIONS:    Scheduled Meds:  (feeding supplement) PROSource Plus  30 mL Oral BID BM   amoxicillin-clavulanate  1 tablet Oral Q12H   vitamin C  500 mg Oral BID   bisoprolol  10 mg Oral QHS   Chlorhexidine Gluconate Cloth  6 each Topical Daily   enoxaparin (  LOVENOX) injection  40 mg Subcutaneous Q24H   famotidine  20 mg Oral BID   feeding supplement  237 mL Oral TID BM   ferrous sulfate  325 mg Oral BID WC   lip balm  1 application. Topical BID   loperamide  2 mg Oral TID with meals   methocarbamol  1,000 mg Oral TID   montelukast  10 mg Oral QHS    multivitamin with minerals  1 tablet Oral Daily   pantoprazole  40 mg Oral Daily   polycarbophil  625 mg Oral BID   sodium chloride flush  10-40 mL Intracatheter Q12H   Continuous Infusions:  cefTRIAXone (ROCEPHIN)  IV 2 g (04/14/22 1235)   micafungin (MYCAMINE) IV 100 mg (04/14/22 1344)   ondansetron (ZOFRAN) IV     PRN Meds:.acetaminophen, albuterol, alum & mag hydroxide-simeth, azelastine, ibuprofen, LORazepam, ondansetron (ZOFRAN) IV **OR** ondansetron (ZOFRAN) IV, ondansetron, simethicone, sodium chloride flush, traMADol, traZODone  SUBJECTIVE:   24 hour events:  Antibiotics adjusted No new labs No new imaging Febrile again at 101.5  States he is feeling better today.  Worked with therapy and seems to be getting stronger.  He reported fevers and night sweats last night.    Review of Systems  All other systems reviewed and are negative.    OBJECTIVE:   Blood pressure 117/81, pulse 92, temperature 98.1 F (36.7 C), temperature source Oral, resp. rate 18, height '5\' 9"'$  (1.753 m), weight 75.1 kg, SpO2 100 %. Body mass index is 24.45 kg/m.  Physical Exam Constitutional:      General: He is not in acute distress.    Appearance: Normal appearance.  HENT:     Head: Normocephalic and atraumatic.  Eyes:     Extraocular Movements: Extraocular movements intact.     Conjunctiva/sclera: Conjunctivae normal.  Pulmonary:     Effort: Pulmonary effort is normal. No respiratory distress.  Abdominal:     General: There is no distension.     Palpations: Abdomen is soft.     Tenderness: There is no abdominal tenderness.     Comments: IR drain in place x 2 with purulent fluid. Colostomy in place.   Musculoskeletal:        General: Normal range of motion.     Cervical back: Normal range of motion and neck supple.  Skin:    General: Skin is warm and dry.     Coloration: Skin is not jaundiced.  Neurological:     General: No focal deficit present.     Mental Status: He is alert and  oriented to person, place, and time.  Psychiatric:        Mood and Affect: Mood normal.        Behavior: Behavior normal.     Lab Results: Lab Results  Component Value Date   WBC 6.3 04/12/2022   HGB 8.0 (L) 04/12/2022   HCT 23.8 (L) 04/12/2022   MCV 92.2 04/12/2022   PLT 310 04/12/2022    Lab Results  Component Value Date   NA 135 04/13/2022   K 4.0 04/13/2022   CO2 26 04/13/2022   GLUCOSE 105 (H) 04/13/2022   BUN 10 04/13/2022   CREATININE 1.00 04/13/2022   CALCIUM 8.4 (L) 04/13/2022   GFRNONAA >60 04/13/2022   GFRAA  03/23/2022    QUESTIONABLE RESULTS, RECOMMEND RECOLLECT TO VERIFY    Lab Results  Component Value Date   ALT 36 04/13/2022   AST 28 04/13/2022   ALKPHOS 287 (H) 04/13/2022  BILITOT 0.5 04/13/2022    No results found for: CRP  No results found for: ESRSEDRATE   I have reviewed the micro and lab results in Epic.  Imaging: No results found.   Imaging  independently reviewed in Epic.    Rayn[facility] for Infectious Disease [facility] [phone] pager 04/15/2022, 8:30 AM

## 2022-04-15 NOTE — Progress Notes (Signed)
PROGRESS NOTE   Subjective/Complaints:   Reports had episode of fever last night, has resolved. Feeling better overall.    Review of Systems  Constitutional:  Positive for fever.       Fever and diaphoresis overnight 04/09/22-04/10/22 have resolved.  HENT:  Negative for congestion and sinus pain.   Eyes: Negative.   Respiratory:  Positive for cough. Negative for shortness of breath.        Cough and shortness of breath overnight 04/09/22-04/10/22 have resolved.  Cardiovascular:  Negative for chest pain and palpitations.  Gastrointestinal:  Negative for nausea and vomiting.  Genitourinary:  Positive for urgency. Negative for dysuria.  Musculoskeletal:  Negative for myalgias.  Neurological:  Negative for dizziness, focal weakness, weakness and headaches.  Endo/Heme/Allergies: Negative.   Psychiatric/Behavioral:  Negative for depression. The patient is nervous/anxious. The patient does not have insomnia.     Objective:   No results found. No results for input(s): WBC, HGB, HCT, PLT in the last 72 hours.  Recent Labs    04/13/22 0420  NA 135  K 4.0  CL 103  CO2 26  GLUCOSE 105*  BUN 10  CREATININE 1.00  CALCIUM 8.4*     Intake/Output Summary (Last 24 hours) at 04/15/2022 0725 Last data filed at 04/15/2022 0300 Gross per 24 hour  Intake 1016 ml  Output 1615 ml  Net -599 ml      Pressure Injury 04/09/22 Heel Left Deep Tissue Pressure Injury - Purple or maroon localized area of discolored intact skin or blood-filled blister due to damage of underlying soft tissue from pressure and/or shear. (Active)  04/09/22 1315  Location: Heel  Location Orientation: Left  Staging: Deep Tissue Pressure Injury - Purple or maroon localized area of discolored intact skin or blood-filled blister due to damage of underlying soft tissue from pressure and/or shear.  Wound Description (Comments):   Present on Admission: Yes     Pressure  Injury 04/09/22 Heel Right Deep Tissue Pressure Injury - Purple or maroon localized area of discolored intact skin or blood-filled blister due to damage of underlying soft tissue from pressure and/or shear. (Active)  04/09/22 1315  Location: Heel  Location Orientation: Right  Staging: Deep Tissue Pressure Injury - Purple or maroon localized area of discolored intact skin or blood-filled blister due to damage of underlying soft tissue from pressure and/or shear.  Wound Description (Comments):   Present on Admission: Yes    Physical Exam: Vital Signs Blood pressure 131/87, pulse 90, temperature 98.2 F (36.8 C), temperature source Oral, resp. rate 18, height '5\' 9"'$  (1.753 m), weight 75.1 kg, SpO2 98 %.   General: Alert and oriented x 3, No apparent distress, in chair HEENT: Head is normocephalic, atraumatic, PERRLA, EOMI, sclera anicteric, oral mucosa pink and moist,  Neck: Supple Heart: RRR, no MRG Chest: CTAB, normal effort Abdomen: Soft, minimal tenderness, bowel sounds positive. Colostomy viable with stool. Comments: Pelvis JP drain x2 with small amount of yellow-white drainage. Skin: Slightly erythematous, but skin intact at former drain site. Stoma aroma colostomy intact. Heel protectors are on both heels. There is a small area of redness on lateral aspect of left heel and medial aspect  of right heel.   Buttocks Left wound covered with dressing Extremities: No clubbing, cyanosis, or edema. Pulses are 2+,  Neuro: Patient is alert and oriented x3, sitting up in bed, follows commands, No focal deficits. Musculoskeletal: FROM all 4 extremities , no joint swelling Strength: 4+/5 overall, BUE and BLE.  Sensory: intact to LT in all 4 extremities Psych: Pt's affect is appropriate. Pt is cooperative. Pleasant  Assessment/Plan: 1. Functional deficits which require 3+ hours per day of interdisciplinary therapy in a comprehensive inpatient rehab setting. Physiatrist is providing close team  supervision and 24 hour management of active medical problems listed below. Physiatrist and rehab team continue to assess barriers to discharge/monitor patient progress toward functional and medical goals  Care Tool:  Bathing    Body parts bathed by patient: Right arm, Left arm, Chest, Abdomen, Face, Front perineal area, Right upper leg, Left upper leg   Body parts bathed by helper: Buttocks, Right lower leg, Left lower leg     Bathing assist Assist Level: Moderate Assistance - Patient 50 - 74%     Upper Body Dressing/Undressing Upper body dressing   What is the patient wearing?: Hospital gown only    Upper body assist Assist Level: Contact Guard/Touching assist    Lower Body Dressing/Undressing Lower body dressing      What is the patient wearing?: Incontinence brief     Lower body assist Assist for lower body dressing: Maximal Assistance - Patient 25 - 49%     Toileting Toileting    Toileting assist Assist for toileting: Minimal Assistance - Patient > 75% (urinal use- has colostomy with total A for care)     Transfers Chair/bed transfer  Transfers assist     Chair/bed transfer assist level: Contact Guard/Touching assist     Locomotion Ambulation   Ambulation assist      Assist level: Contact Guard/Touching assist Assistive device: Walker-rolling Max distance: 52f   Walk 10 feet activity   Assist     Assist level: Contact Guard/Touching assist Assistive device: Walker-rolling   Walk 50 feet activity   Assist Walk 50 feet with 2 turns activity did not occur: Safety/medical concerns         Walk 150 feet activity   Assist Walk 150 feet activity did not occur: Safety/medical concerns         Walk 10 feet on uneven surface  activity   Assist Walk 10 feet on uneven surfaces activity did not occur: Safety/medical concerns         Wheelchair     Assist Is the patient using a wheelchair?: Yes Type of Wheelchair: Manual     Wheelchair assist level: Dependent - Patient 0% Max wheelchair distance: 150    Wheelchair 50 feet with 2 turns activity    Assist        Assist Level: Dependent - Patient 0%   Wheelchair 150 feet activity     Assist      Assist Level: Dependent - Patient 0%   Blood pressure 131/87, pulse 90, temperature 98.2 F (36.8 C), temperature source Oral, resp. rate 18, height '5\' 9"'$  (1.753 m), weight 75.1 kg, SpO2 98 %.   Medical Problem List and Plan: 1. Functional deficits secondary to debility secondary to pelvic abscess.  Status post laparoscopy, abdominal washout drain placement with creation of diverting colostomy 03/20/2022             -patient may shower but incisions/drains should be covered             -  ELOS/Goals: 10-14 days modI             -Continue PT/OT  -Walked 50 feet with PT  -Team conference yesterday expected discharge 04/23/22 2.  Antithrombotics: -DVT/anticoagulation:  Pharmaceutical: Lovenox.  Venous Doppler negative             -antiplatelet therapy: N/A 5/27 Continue Lovenox 3. Pain Management: Robaxin 1000 mg 3 times daily, tramadol as needed 5/31- Pain continues to be controlled, continue PRN tramadol 6/1 Discussed use of pain meds when needed to stay ahead of pain for therapy 4. Mood: Ativan as needed as well as trazodone             -antipsychotic agents: N/A 5. Neuropsych: This patient is capable of making decisions on his own behalf. 6. Skin/Wound Care: Routine skin checks with colostomy education 7. Fluids/Electrolytes/Nutrition: Routine in and outs with follow-up chemistries 5/27 Slightly low sodium (133) and potassium (3.4), continue to trend, consider repleting 5/30 Na up to 135 and K wnl after repletion  BMET    Component Value Date/Time   NA 135 04/13/2022 0420   K 4.0 04/13/2022 0420   CL 103 04/13/2022 0420   CO2 26 04/13/2022 0420   GLUCOSE 105 (H) 04/13/2022 0420   BUN 10 04/13/2022 0420   CREATININE 1.00 04/13/2022 0420    CALCIUM 8.4 (L) 04/13/2022 0420   GFRNONAA >60 04/13/2022 0420     8.  ID/intra-abdominal infection.  Continue micafungin 100 mg daily as well as Zosyn 3.375 g every 8 hours.  Plan is for 6 weeks starting date 5/16 ending 05/11/2022 and will need repeat CT abdomen/pelvis on left thigh in 1 to 2 weeks -5/27 Pt has event last night on 5/26 of temp 101.6, HR 115, new onset cough, with shortness of breath.  Per night nurse on 5/26-5/27 became diaphoretic around 0000 on 5/27 and drenched the bed with sweat, with no subsequent fevers or episodes of tachycardia. Orders were placed for CBC, BMET, blood cultures x2, CXR, COVID, respiratory panel, and repeat CT abd/pelvis. ID was consulted the evening of 5/26 with recommendation given to continue current regimen of 6 week Zosyn and micafungin based on original plan.  Per [place], CT abdomen/pelvis indicated with febrile event, especially in light that patient's last CT scan was 04/05/22 and he inadvertently lost one of his JP drains on the morning of 04/06/22. -Respiratory panel and COVID negative.  CXR shows Small left pleural effusion with atelectasis at the left lung base. -CT abd/pelvis: demonstrates a complex left retroperitoneal fluid collection, with superior and inferior components that appear to communicate overlying the Gerota's fascia and left psoas musculature. ID to round on pt on 04/11/22 and will decide next steps. 04/11/22 See ID and Radiology note.  IR was consulted for intra-abdominal fluid collection. Dr. Denna Haggard (IR) has approved patient for aspiration of inferior portion of fluid collection. -Per Radiology note, PA will reach out to General Surgery (Dr. Marcello Moores) to notify of plan. Also, called Dr. Rosendo Gros, MD on-call for General Surgery this weekend to update office on patient since surgery signed off on 04/09/22.  I was unable to reach provider since he was in-procedure.  But relayed message by telephone to nurse, Karena Addison, [place] of patient's febrile  episode 04/09/22-04/10/22 and plans for abdominal fluid removal and possible drain placement by IR. 5/29 IR completed aspiration of 68m of purulent material, no drain placed 5/29 ID continuing Zosyn, micafungin 5/31 culture of L flank abscess with GNR and gram pos cocci  6/1 ID changing zosyn to Ceftriaxone and Augmentin, continuing Micafungin, continue to follow 9.  Postoperative anemia.  Continue iron supplement.  Follow-up CBC 5/28 Hgb 8.2 low but stable, continue to trend and monitor. 5/29 Hgb stable at 8.0, follow     Latest Ref Rng & Units 04/12/2022    6:44 AM 04/10/2022    2:50 AM 04/09/2022    3:45 PM  CBC  WBC 4.0 - 10.5 K/uL 6.3   5.5   4.3    Hemoglobin 13.0 - 17.0 g/dL 8.0   8.2   8.1    Hematocrit 39.0 - 52.0 % 23.8   23.1   24.1    Platelets 150 - 400 K/uL 310   301   296      10.  Hypertension.  Continue Zebeta 10 mg nightly,   Monitor with increased mobility 5/20 BP well controlled, continue to monitor, continue zebeta 6/1 Well controlled, follow    04/15/2022    3:00 AM 04/14/2022    9:22 PM 04/14/2022    8:01 PM  Vitals with BMI  Systolic [phone]  Diastolic 87 88 89  Pulse 90 110 100    11.  Decreased nutritional storage.  Dietary follow-up. Continue Prosource.  12.  Urinary retention.  Foley tube removed and urinating.  Check PVR 5/27 Pt is voiding okay, reported that he had some urinary incontinence for the first day or two immediately following Foley removal, which has since resolved. 5/31 reports no difficulty voiding, follow  13. Hypokalemia  -5/29KCL today, repeat labs  -5/30 K+ wnl now 14. Hypoalbuminemia  -Continue ensure supplementation  -Discussed healthy diet, He ate all his breakfast today 5/30 15. Wound at left lower buttocks site of previous drain  -5/20 WOC nurse consult recommends aquacel Ag with dry gauze and silicone foam. Continue to follow 16. Asthma,   -Using albuterol inhaler, monitor, improved today  -6/1 Reports breathing continues  to be improved LOS: 6 days A FACE TO FACE EVALUATION WAS PERFORMED  Jennye Boroughs 04/15/2022, 7:25 AM

## 2022-04-15 NOTE — Progress Notes (Signed)
Occupational Therapy Session Note  Patient Details  Name: Tyler [name] MRN: [id number] Date of Birth: 1950-12-22  Today's Date: 04/15/2022 OT Individual Time: 1300-1355 OT Individual Time Calculation (min): 55 min    Short Term Goals: Week 1:  OT Short Term Goal 1 (Week 1): Pt will complete grooming sink side level standing up to 4 minute intervals with brief intermittent rests seated as needed with close S OT Short Term Goal 2 (Week 1): Pt will complete toilet and shower bench transfers with CGA using grab bars OT Short Term Goal 3 (Week 1): Pt will complete UB bathing and dressing with close S after set up OT Short Term Goal 4 (Week 1): Pt will complete LB bathing and dressing using AE as needed with min A OT Short Term Goal 5 (Week 1): Pt will verbally identify, express and employ 3/3 energy conservation strategies during ADL's, functional mobility and light UE ther ex//act with min VC's.  Skilled Therapeutic Interventions/Progress Updates:    Pt resting in bed upon arrival. Pt reports he is "worn out" from morning therapies but agreeable to bed level BUE strengthening. Using 3# bar and 3kg ball pt completed chest presses 3x10 and 3x8, biceps curls 3x12, and overhead straight arm raises 3x10. Pt demonstrated proper form to maximize eccentric control with all exercises. Rest breaks between each set. Discussed energy conservation strategies. Pt remained in bed with bed alarm acivated and all needs within reach.   Therapy Documentation Precautions:  Precautions Precautions: Fall Precaution Comments: drains, colostomy, rectal drainage Restrictions Weight Bearing Restrictions: No   Pain:  Pt denies pain this afternoon   Exercises: General Exercises - Upper Extremity Shoulder Flexion: Strengthening;Both;10 reps;Supine Shoulder Extension: Strengthening;Both;10 reps;Supine;Bar weights/barbell Bar Weights/Barbell (Shoulder Extension): 3 lbs Elbow Flexion: Strengthening;Both;10  reps;Supine;Bar weights/barbell Bar Weights/Barbell (Elbow Flexion): 5 lbs Elbow Extension: Strengthening;Both;10 reps;Supine;Bar weights/barbell Bar Weights/Barbell (Elbow Extension): 5 lbs  Therapy/Group: Individual Therapy  Leroy Libman 04/15/2022, 1:59 PM

## 2022-04-15 NOTE — Progress Notes (Signed)
Physical Therapy Session Note  Patient Details  Name: Tyler [name] MRN: [id number] Date of Birth: 05/14/1951  Today's Date: 04/15/2022 PT Individual Time: 0935-1000 PT Individual Time Calculation (min): 25 min   Short Term Goals: Week 1:  PT Short Term Goal 1 (Week 1): Pt will transfer to bed with CGA PT Short Term Goal 2 (Week 1): Pt will ambulate 40f with CGA PT Short Term Goal 3 (Week 1): Pt will propell WC 1052fwith supervision assist PT Short Term Goal 4 (Week 1): Pt will ascend 2 steps with min assist and BUE Support  Skilled Therapeutic Interventions/Progress Updates:    Session focused on functional endurance, transfers, and gait training with RW. Pt performed bed mobility with supervision during session using bed rails for support and assistance, slow movements due to discomfort. Pt requires assistance for donning of new gown and total assist for socks and shoes. Pt performed sit > stand with CGA overall with cues for technique. Functional gait training in hallway  150' with RW with CGA/close supervision with focus on gait pattern, endurance, training, and upright posture. Pt limited due to muscle fatigue. Declines further upright mobility but performed BLE (intermittent UE use for uneven surface) propulsion of w/c for continued strengthening and endurance training to return to room. Pt transfers back to bed without AD with CGA and requires total assist to doff shoes. He returns to supine with supervision and all needs in reach.   Therapy Documentation Precautions:  Precautions Precautions: Fall Precaution Comments: drains, colostomy, rectal drainage Restrictions Weight Bearing Restrictions: No  Pain: No reports of pain but demonstrates overall discomfort with mobility. Did not rate and works through it.     Therapy/Group: Individual Therapy  GrCanary BrimrIvory BroadPT, DPT, CBIS  04/15/2022, 10:12 AM

## 2022-04-15 NOTE — Progress Notes (Signed)
Physical Therapy Session Note  Patient Details  Name: Tyler [name] MRN: [id number] Date of Birth: [birth year]  Today's Date: 04/15/2022 PT Individual Time: 0802-0902 PT Individual Time Calculation (min): 60 min   Short Term Goals: Week 1:  PT Short Term Goal 1 (Week 1): Pt will transfer to bed with CGA PT Short Term Goal 2 (Week 1): Pt will ambulate 65f with CGA PT Short Term Goal 3 (Week 1): Pt will propell WC 1077fwith supervision assist PT Short Term Goal 4 (Week 1): Pt will ascend 2 steps with min assist and BUE Support Week 2:    Week 3:     Skilled Therapeutic Interventions/Progress Updates:    PAIN 5/10 bottom at rest, 8/10 Rhip w/stretching.  Pt initially supine, c/o hip pain.  Pt able to scoot up in bed mod I w/additional time. Therapist/pt performed LE strething and ROM including: Groin stretch/frog leg Lower trunk rotation AROM Glut stretching Hamstring stretching 90/90 hip IR/ER PROM to tolerance.  Supine to sit mod I.  Sit to stand w/RW w/cga.  Gait x 59f66fo sink w/RW w/cga.  From w level pt performed oral hygiene, shaving, andupper body bathing w/set up only. Sit to stand from wc mult times for bathing of groin and donning clean briefm cga and use of counter for stability. Total assist for brief. Dons shirt in sitting w/set up only. Deferred pants due to wound care this am. Donned clean gown for modesty w/ set up.  Gait 19f79f household simulated environment w/cga only w/RW.  Sit to supine in bed w/supervision and use of rail, additional time.   Pt left supine w/rails up x 3, alarm set, bed in lowest position, and needs in reach.  Therapy Documentation Precautions:  Precautions Precautions: Fall Precaution Comments: drains, colostomy, rectal drainage Restrictions Weight Bearing Restrictions: No Therapy/Group: Individual Therapy BarbCallie Fielding   BarbJerrilyn Cairo/2023, 9:04 AM

## 2022-04-15 NOTE — Progress Notes (Signed)
Physical Therapy Session Note  Patient Details  Name: Tyler [name] MRN: [id number] Date of Birth: 02-18-1951  Today's Date: 04/15/2022 PT Individual Time: 1100-1200 PT Individual Time Calculation (min): 60 min   Short Term Goals: Week 1:  PT Short Term Goal 1 (Week 1): Pt will transfer to bed with CGA PT Short Term Goal 2 (Week 1): Pt will ambulate 64f with CGA PT Short Term Goal 3 (Week 1): Pt will propell WC 1072fwith supervision assist PT Short Term Goal 4 (Week 1): Pt will ascend 2 steps with min assist and BUE Support  Skilled Therapeutic Interventions/Progress Updates: Pt presents semi-reclined in bed and agreeable to therapy.  Pt states increased fatigue as well as irritation that dressing change was not done.  Pt transfers supine to sit w/ supervision.  Pt requires total A to don shoes, able to don 2nd gown once given.  Pt transfers sit to stand w/ CGAalthough c/o "different feeling" in wound site.  Pt amb 150' down hallway to main gym w/ RW and CGA.  Pt amb 75' w/ RW including turns to return to seat.  Pt requires extended seated rest breaks 2/2 fatigue.  Pt performed multiple sit stand transfers and standing balance w/o UE support only 30 seconds 2/2 LE fatigue.  Pt encouraged to amb back to room w/ RW and CGA.  Pt then transferred sit to supine and [facility] and [facility].  Pt performed LE there ex for AP, abd/add, HS 3 x 15 w/ rest breaks required 2/2 fatigue.  Bed alarm on and all needs in reach at conclusion of therapy.     Therapy Documentation Precautions:  Precautions Precautions: Fall Precaution Comments: drains, colostomy, rectal drainage Restrictions Weight Bearing Restrictions: No General:   Vital Signs:   Pain:7/10 at wound site.       Therapy/Group: Individual Therapy  JeLadoris Gene/11/2021, 12:26 PM

## 2022-04-16 LAB — CREATININE, SERUM
Creatinine, Ser: 0.93 mg/dL (ref 0.61–1.24)
GFR, Estimated: >60 mL/min (ref >60)

## 2022-04-16 NOTE — Progress Notes (Signed)
Brief ID Note:  See note from 6/1.  No new changes today as he has been afebrile for the past 24 hrs and WBC remains normal.  Continue current antimicrobials.  Dr Candiss Norse available as needed this weekend.  New ID team (Dr Baxter Flattery or Dr Candiss Norse) will be here Monday.      Raynelle Highland [facility] 04/16/2022, 8:14 AM

## 2022-04-16 NOTE — Progress Notes (Signed)
Physical Therapy Weekly Progress Note  Patient Details  Name: Tyler [name] MRN: [id number] Date of Birth: 10-Aug-1951  Beginning of progress report period: Apr 10, 2022 End of progress report period: April 16, 2022  Today's Date: 04/16/2022 PT Individual Time:  -      Patient has met 4 of 4 short term goals.  Pt transferring all surfaces w/ supervision and increased time for OOB transfers.  Pt now amb 150' w/ RW and close supervision to [facility] and encouragement.  Pt able to negotiate w/c 150' using BLEs and supervision only.  Finally, pt negotiated 4 steps w/ B rails and CGA, step-to gait pattern.  Pt making good progress to LTG's 2/2 improved participation w/ therapy services.  Improved mobility and attitude allowing for increased participation for safe D/C to home when appropriate.  Patient continues to demonstrate the following deficits muscle weakness and muscle joint tightness and decreased cardiorespiratoy endurance and therefore will continue to benefit from skilled PT intervention to increase functional independence with mobility.  Patient progressing toward long term goals..  Continue plan of care.  PT Short Term Goals Week 1:  PT Short Term Goal 1 (Week 1): Pt will transfer to bed with CGA PT Short Term Goal 1 - Progress (Week 1): Met PT Short Term Goal 2 (Week 1): Pt will ambulate 40f with CGA PT Short Term Goal 2 - Progress (Week 1): Met PT Short Term Goal 3 (Week 1): Pt will propell WC 1074fwith supervision assist PT Short Term Goal 3 - Progress (Week 1): Met PT Short Term Goal 4 (Week 1): Pt will ascend 2 steps with min assist and BUE Support PT Short Term Goal 4 - Progress (Week 1): Met  Skilled Therapeutic Interventions/Progress Updates: Pt presents supine in bed and agreeable to therapy.  Pt states 0/10 pain.  Pt transfer sup to sit using siderail and supervision.  PT pinned drains to R side of shirt.  PT assisted w/ threading shorts over feet in sitting.  PT donned shoes  in sitting.  Pt transferred sit to stand from low heigth bed w/ CGA to supervision.  Pt assisted w/ pulling shorts over hips w/ PT assist 2/2 colostomy and drain sites.  Pt amb 150' w/ RW and mostly close supervision, but initially w/ CGA.  Pt encouraged to activate glut when WB on that side 2/2 increased sway noted.  Pt does require seated rest breaks but improved tolaerance from yesterday.  Pt performed 3 x 10 C-R PNF to B HS over T-ball for increased knee extension, especially L > R.  Pt negotiated w/c x 150' and 100' using B heels only at supervision level.  Pt amb x 8' to stairs w/ supervision.  Pt negotiated 4 steps w/ B rails and CGA, step-to gait pattern and verbal cues for UE advancement.  Pt pleased w/ performance.  Pt amb to room w/ RW and close supervision.  Pt sat EOB, but then requested to lie down.  Pt removed shoes w/ supervision and then returned to supine w/ supervision.  [facility] and [facility].  Bed alarm on and all needs in reach.      Therapy Documentation Precautions:  Precautions Precautions: Fall Precaution Comments: drains, colostomy, rectal drainage Restrictions Weight Bearing Restrictions: No General:   Vital Signs: Therapy Vitals Temp: 98.9 F (37.2 C) Pulse Rate: 95 Resp: 17 BP: 118/86 Patient Position (if appropriate): Lying Oxygen Therapy SpO2: 100 % O2 Device: Room Air Pain:0/10  w/o pain meds.     Therapy/Group: Individual Therapy  Ladoris Gene 04/16/2022, 3:43 PM

## 2022-04-16 NOTE — Progress Notes (Signed)
Occupational Therapy Session Note  Patient Details  Name: Tyler [name] MRN: [id number] Date of Birth: 1951/07/30  Today's Date: 04/16/2022 OT Individual Time: 1340-1500 OT Individual Time Calculation (min): 80 min    Short Term Goals: Week 1:  OT Short Term Goal 1 (Week 1): Pt will complete grooming sink side level standing up to 4 minute intervals with brief intermittent rests seated as needed with close S OT Short Term Goal 1 - Progress (Week 1): Met OT Short Term Goal 2 (Week 1): Pt will complete toilet and shower bench transfers with CGA using grab bars OT Short Term Goal 2 - Progress (Week 1): Met (have just worked on toilet transfers) OT Short Term Goal 3 (Week 1): Pt will complete UB bathing and dressing with close S after set up OT Short Term Goal 3 - Progress (Week 1): Met OT Short Term Goal 4 (Week 1): Pt will complete LB bathing and dressing using AE as needed with min A OT Short Term Goal 4 - Progress (Week 1): Met OT Short Term Goal 5 (Week 1): Pt will verbally identify, express and employ 3/3 energy conservation strategies during ADL's, functional mobility and light UE ther ex//act with min VC's. OT Short Term Goal 5 - Progress (Week 1): Met Week 2:  OT Short Term Goal 1 (Week 2): STGs = LTGs  Skilled Therapeutic Interventions/Progress Updates:    1:1 Pt received in the bed. Pt able to get to the EOB mod I and transferred into the w/c with supervision. Pt wheeled to the sink and Ot assisted with washing hair in sink with hair washing basin. Pt stood at the sink with supervison to distant supervision to blow dry hair. Pt self propelled with bilateral LEs from his room all the way to the [place] and into the elevator for continued Le strengthening.  Therapist pushed the chair outside and sat in the shade. Pt then engaged in bilateral LE stretching and tolerating sustained positions with focus on hamstrings. Pt able to cross his right Le over his left to assist with shoe  donning and almost on the  left (able to almost get into the figure 4 position in sitting. After rest. Pt ambulated with contact guard to supervision at a slow rate with RW around the water feature outside on uneven pavement. Pt assisted via w/c back to his room. Pt transferred back into bed with distant supervision. Pt left with safety measures in place.   Therapy Documentation Precautions:  Precautions Precautions: Fall Precaution Comments: drains, colostomy, rectal drainage Restrictions Weight Bearing Restrictions: No  Pain: No pain other than with regular stretching     Therapy/Group: Individual Therapy  Willeen Cass [facility] 04/16/2022, 3:14 PM

## 2022-04-16 NOTE — Consult Note (Signed)
[place] Nurse ostomy follow up Stoma type/location: LLQ Colostomy Stomal assessment/size: 1 inch x 1 and 3/8 oval stoma inch with lumen pointing toward 9 o'clock Peristomal assessment: intact Treatment options for stomal/peristomal skin: one and a half skin barrier rings, one to encircle stoma and the other placed from 7-10 o'clock. Output: soft brown stool Ostomy pouching: 1pc. Convex pouching system with skin barrier rings (1.5) Education provided:  Extended session for patient to watch pouching change with hand held mirror and to assist as he is able. Current pouching system was intact upon removal and could have gone another day, but recommend changing every 3 days to avoid changing when leakage occurs and instead focusing on planned, predictable changes and repeatable wear times.  Patient observed pouch removal from top to bottom. Demonstrated inspection of back of pouch to determine wear time. Skin cleansed with warm washcloth and allowed to dry. Stoma measured and pattern traced on to back of new pouch. Patient cut out pattern. Skin barrier ring stretch to accommodate stoma, 1/2 ring placed on top of ring from 7-10 o'clock.Plastic backing removed from pouch, tape edges turned back to make their removal easier. Pouch placed, patient removed adhesive strips and used gentle warming hand pressure to enhance adhesion. Patient closed pouch.  Patient instructed how to remove viewing panel from pouch and also how to apply pouch is viewing panel is not removed. Discussed bathing and showering without a pouch on pouch change days after JP drains are removed.  Enrolled patient in [employer] Start Discharge program: Yes. Box has arrived at home.  Next pouch change is scheduled for Monday, 6/5 and another scheduled for Thursday, 6/8. Patient is anticipating discharge home on Friday, 6/9.  OT is providing excellent suggestions for patient to become independent in emptying in the bathroom to meet self  care goals.  Supplies ordered to room:  1 box (5) ostomy pouches and 8 rings.  [place] Nurse wound follow up Wound type: Surgical, Left lower buttock wound at previous drain site Measurement:4cm x 0.8cm x 0.2cm Wound bed:red, moist Drainage (amount, consistency, odor) moderate serous Periwound:macerated Dressing procedure/placement/frequency: Continue dressing changed with silver hydrofiber (Aquacel Ag+ Advantage) and daily changes.  [place] nursing team will follow, and will remain available to this patient, the nursing and medical teams.     Thanks, Maudie Flakes, MSN, RN, [place], Arther Abbott  Pager# [phone]

## 2022-04-16 NOTE — Progress Notes (Signed)
PROGRESS NOTE   Subjective/Complaints:   No fevers last night. No new concerns. Working with therapy.    Review of Systems  Constitutional:  Negative for fever.       Fever and diaphoresis overnight 04/09/22-04/10/22 have resolved.  HENT:  Negative for congestion and sinus pain.   Eyes: Negative.   Respiratory:  Positive for cough. Negative for shortness of breath.        Cough and shortness of breath overnight 04/09/22-04/10/22 have resolved.  Cardiovascular:  Negative for chest pain and palpitations.  Gastrointestinal:  Negative for nausea and vomiting.  Genitourinary:  Positive for urgency. Negative for dysuria.  Musculoskeletal:  Negative for myalgias.  Neurological:  Negative for dizziness, focal weakness, weakness and headaches.  Endo/Heme/Allergies: Negative.   Psychiatric/Behavioral:  Negative for depression. The patient is nervous/anxious. The patient does not have insomnia.     Objective:   No results found. Recent Labs    04/15/22 0936  WBC 9.4  HGB 8.1*  HCT 24.3*  PLT 304    Recent Labs    04/16/22 0434  CREATININE 0.93     Intake/Output Summary (Last 24 hours) at 04/16/2022 0735 Last data filed at 04/16/2022 0500 Gross per 24 hour  Intake 1180 ml  Output 1420 ml  Net -240 ml      Pressure Injury 04/09/22 Heel Left Deep Tissue Pressure Injury - Purple or maroon localized area of discolored intact skin or blood-filled blister due to damage of underlying soft tissue from pressure and/or shear. (Active)  04/09/22 1315  Location: Heel  Location Orientation: Left  Staging: Deep Tissue Pressure Injury - Purple or maroon localized area of discolored intact skin or blood-filled blister due to damage of underlying soft tissue from pressure and/or shear.  Wound Description (Comments):   Present on Admission: Yes     Pressure Injury 04/09/22 Heel Right Deep Tissue Pressure Injury - Purple or maroon localized  area of discolored intact skin or blood-filled blister due to damage of underlying soft tissue from pressure and/or shear. (Active)  04/09/22 1315  Location: Heel  Location Orientation: Right  Staging: Deep Tissue Pressure Injury - Purple or maroon localized area of discolored intact skin or blood-filled blister due to damage of underlying soft tissue from pressure and/or shear.  Wound Description (Comments):   Present on Admission: Yes    Physical Exam: Vital Signs Blood pressure (!) 125/92, pulse 93, temperature 99.2 F (37.3 C), temperature source Oral, resp. rate 18, height '5\' 9"'$  (1.753 m), weight 75.1 kg, SpO2 97 %.   General: Alert and oriented x 3, No apparent distress, walking with therapy HEENT: Head is normocephalic, atraumatic, PERRLA, EOMI, sclera anicteric, oral mucosa pink and moist,  Neck: Supple Heart: RRR Chest: CTAB, normal effort, no RRW Abdomen: Soft, minimal tenderness, bowel sounds positive. Colostomy viable with stool. Comments: Pelvis JP drain x2 with small amount of yellow-white drainage. Skin: Slightly erythematous, but skin intact at former drain site. Stoma aroma colostomy intact. Heel protectors are on both heels. There is a small area of redness on lateral aspect of left heel and medial aspect of right heel.   Buttocks Left wound covered with dressing Extremities: No  clubbing, cyanosis, or edema. Pulses are 2+,  Neuro: Patient is alert and oriented x3, sitting up in bed, follows commands, No focal deficits. Musculoskeletal: FROM all 4 extremities , no joint swelling Strength: 4+/5 overall, BUE and BLE.  Sensory: intact to LT in all 4 extremities Psych: Pt's affect is appropriate. Pt is cooperative. Pleasant  Assessment/Plan: 1. Functional deficits which require 3+ hours per day of interdisciplinary therapy in a comprehensive inpatient rehab setting. Physiatrist is providing close team supervision and 24 hour management of active medical problems listed  below. Physiatrist and rehab team continue to assess barriers to discharge/monitor patient progress toward functional and medical goals  Care Tool:  Bathing    Body parts bathed by patient: Right arm, Left arm, Chest, Abdomen, Face, Front perineal area, Right upper leg, Left upper leg   Body parts bathed by helper: Buttocks, Right lower leg, Left lower leg     Bathing assist Assist Level: Moderate Assistance - Patient 50 - 74%     Upper Body Dressing/Undressing Upper body dressing   What is the patient wearing?: Hospital gown only    Upper body assist Assist Level: Contact Guard/Touching assist    Lower Body Dressing/Undressing Lower body dressing      What is the patient wearing?: Incontinence brief     Lower body assist Assist for lower body dressing: Maximal Assistance - Patient 25 - 49%     Toileting Toileting    Toileting assist Assist for toileting: Minimal Assistance - Patient > 75% (urinal use- has colostomy with total A for care)     Transfers Chair/bed transfer  Transfers assist     Chair/bed transfer assist level: Contact Guard/Touching assist     Locomotion Ambulation   Ambulation assist      Assist level: Contact Guard/Touching assist Assistive device: Walker-rolling Max distance: 150'   Walk 10 feet activity   Assist     Assist level: Contact Guard/Touching assist Assistive device: Walker-rolling   Walk 50 feet activity   Assist Walk 50 feet with 2 turns activity did not occur: Safety/medical concerns  Assist level: Contact Guard/Touching assist Assistive device: Walker-rolling    Walk 150 feet activity   Assist Walk 150 feet activity did not occur: Safety/medical concerns  Assist level: Contact Guard/Touching assist Assistive device: Walker-rolling    Walk 10 feet on uneven surface  activity   Assist Walk 10 feet on uneven surfaces activity did not occur: Safety/medical concerns          Wheelchair     Assist Is the patient using a wheelchair?: Yes Type of Wheelchair: Manual    Wheelchair assist level: Dependent - Patient 0% Max wheelchair distance: 150    Wheelchair 50 feet with 2 turns activity    Assist        Assist Level: Dependent - Patient 0%   Wheelchair 150 feet activity     Assist      Assist Level: Dependent - Patient 0%   Blood pressure (!) 125/92, pulse 93, temperature 99.2 F (37.3 C), temperature source Oral, resp. rate 18, height '5\' 9"'$  (1.753 m), weight 75.1 kg, SpO2 97 %.   Medical Problem List and Plan: 1. Functional deficits secondary to debility secondary to pelvic abscess.  Status post laparoscopy, abdominal washout drain placement with creation of diverting colostomy 03/20/2022             -patient may shower but incisions/drains should be covered             -  ELOS/Goals: 10-14 days modI             -Continue PT/OT  -Walked 50 feet with PT  -Team conference yesterday expected discharge 04/23/22  -he worked on Sport and exercise psychologist and socks today with OT 2.  Antithrombotics: -DVT/anticoagulation:  Pharmaceutical: Lovenox.  Venous Doppler negative             -antiplatelet therapy: N/A 5/27 Continue Lovenox 3. Pain Management: Robaxin 1000 mg 3 times daily, tramadol as needed 5/31- Pain continues to be controlled, continue PRN tramadol 6/1 Discussed use of pain meds when needed to stay ahead of pain for therapy 4. Mood: Ativan as needed as well as trazodone             -antipsychotic agents: N/A 5. Neuropsych: This patient is capable of making decisions on his own behalf. 6. Skin/Wound Care: Routine skin checks with colostomy education 7. Fluids/Electrolytes/Nutrition: Routine in and outs with follow-up chemistries 5/27 Slightly low sodium (133) and potassium (3.4), continue to trend, consider repleting 5/30 Na up to 135 and K wnl after repletion  BMET    Component Value Date/Time   NA 135 04/13/2022 0420   K 4.0  04/13/2022 0420   CL 103 04/13/2022 0420   CO2 26 04/13/2022 0420   GLUCOSE 105 (H) 04/13/2022 0420   BUN 10 04/13/2022 0420   CREATININE 0.93 04/16/2022 0434   CALCIUM 8.4 (L) 04/13/2022 0420   GFRNONAA >60 04/16/2022 0434     8.  ID/intra-abdominal infection.  Continue micafungin 100 mg daily as well as Zosyn 3.375 g every 8 hours.  Plan is for 6 weeks starting date 5/16 ending 05/11/2022 and will need repeat CT abdomen/pelvis on left thigh in 1 to 2 weeks -5/27 Pt has event last night on 5/26 of temp 101.6, HR 115, new onset cough, with shortness of breath.  Per night nurse on 5/26-5/27 became diaphoretic around 0000 on 5/27 and drenched the bed with sweat, with no subsequent fevers or episodes of tachycardia. Orders were placed for CBC, BMET, blood cultures x2, CXR, COVID, respiratory panel, and repeat CT abd/pelvis. ID was consulted the evening of 5/26 with recommendation given to continue current regimen of 6 week Zosyn and micafungin based on original plan.  Per [facility] [place], CT abdomen/pelvis indicated with febrile event, especially in light that patient's last CT scan was 04/05/22 and he inadvertently lost one of his JP drains on the morning of 04/06/22. -Respiratory panel and COVID negative.  CXR shows Small left pleural effusion with atelectasis at the left lung base. -CT abd/pelvis: demonstrates a complex left retroperitoneal fluid collection, with superior and inferior components that appear to communicate overlying the Gerota's fascia and left psoas musculature. ID to round on pt on 04/11/22 and will decide next steps. 04/11/22 See ID and Radiology note.  IR was consulted for intra-abdominal fluid collection. Dr. Denna Haggard (IR) has approved patient for aspiration of inferior portion of fluid collection. -Per Radiology note, PA will reach out to General Surgery (Dr. Marcello Moores) to notify of plan. Also, called Dr. Rosendo Gros, MD on-call for General Surgery this weekend to update office on patient since  surgery signed off on 04/09/22.  I was unable to reach provider since he was in-procedure.  But relayed message by telephone to nurse, Karena Addison, [place] of patient's febrile episode 04/09/22-04/10/22 and plans for abdominal fluid removal and possible drain placement by IR. 5/29 IR completed aspiration of 14m of purulent material, no drain placed 5/29 ID continuing Zosyn, micafungin 5/31  culture of L flank abscess with GNR and gram pos cocci 6/1 ID changing zosyn to Ceftriaxone and Augmentin, continuing Micafungin, continue to follow 6/2 no fevers overnight, follow 9.  Postoperative anemia.  Continue iron supplement.  Follow-up CBC 5/28 Hgb 8.2 low but stable, continue to trend and monitor. 5/29 Hgb stable at 8.0, follow     Latest Ref Rng & Units 04/15/2022    9:36 AM 04/12/2022    6:44 AM 04/10/2022    2:50 AM  CBC  WBC 4.0 - 10.5 K/uL 9.4   6.3   5.5    Hemoglobin 13.0 - 17.0 g/dL 8.1   8.0   8.2    Hematocrit 39.0 - 52.0 % 24.3   23.8   23.1    Platelets 150 - 400 K/uL 304   310   301      10.  Hypertension.  Continue Zebeta 10 mg nightly,   Monitor with increased mobility 5/20 BP well controlled, continue to monitor, continue zebeta 6/2 continues to be well controlled overall, follow    04/16/2022    3:51 AM 04/15/2022    8:13 PM 04/15/2022    2:13 PM  Vitals with BMI  Systolic [phone]  Diastolic 92 84 84  Pulse 93 102 92    11.  Decreased nutritional storage.  Dietary follow-up. Continue Prosource.  12.  Urinary retention.  Foley tube removed and urinating.  Check PVR 5/27 Pt is voiding okay, reported that he had some urinary incontinence for the first day or two immediately following Foley removal, which has since resolved. 5/31 reports no difficulty voiding, follow  13. Hypokalemia  -5/29KCL today, repeat labs  -5/30 K+ wnl now 14. Hypoalbuminemia  -Continue ensure supplementation  -Ate 90 percent of breakfast this AM, continue to monitor 15. Wound at left lower buttocks site of  previous drain  -5/20 WOC nurse consult recommends aquacel Ag with dry gauze and silicone foam. Continue to follow 16. Asthma,   -Using albuterol inhaler, monitor, improved today  -6/2 Breathing continues to do better LOS: 7 days A FACE TO FACE EVALUATION WAS PERFORMED  Jennye Boroughs 04/16/2022, 7:35 AM

## 2022-04-16 NOTE — Progress Notes (Signed)
Occupational Therapy Weekly Progress Note  Patient Details  Name: Tyler [name] MRN: [id number] Date of Birth: Aug 17, 1951  Beginning of progress report period: Apr 10, 2022 End of progress report period: April 16, 2022  Today's Date: 04/16/2022 OT Individual Time: 1112-1207 OT Individual Time Calculation (min): 55 min    Patient has met 5 of 5 short term goals.  Pt has made excellent progress this week with his endurance and strength and is now able to be mobile with light CGA to close S with RW and set up to min A with self care.  He is developing hip flexibilty to enable him to dress LB without AE but is also learning how to use it should he need it at discharge.    Patient continues to demonstrate the following deficits: muscle weakness and muscle joint tightness, decreased cardiorespiratoy endurance, and decreased standing balance and therefore will continue to benefit from skilled OT intervention to enhance overall performance with BADL and iADL.  Patient progressing toward long term goals..  Continue plan of care.  OT Short Term Goals Week 1:  OT Short Term Goal 1 (Week 1): Pt will complete grooming sink side level standing up to 4 minute intervals with brief intermittent rests seated as needed with close S OT Short Term Goal 1 - Progress (Week 1): Met OT Short Term Goal 2 (Week 1): Pt will complete toilet and shower bench transfers with CGA using grab bars OT Short Term Goal 2 - Progress (Week 1): Met (have just worked on toilet transfers) OT Short Term Goal 3 (Week 1): Pt will complete UB bathing and dressing with close S after set up OT Short Term Goal 3 - Progress (Week 1): Met OT Short Term Goal 4 (Week 1): Pt will complete LB bathing and dressing using AE as needed with min A OT Short Term Goal 4 - Progress (Week 1): Met OT Short Term Goal 5 (Week 1): Pt will verbally identify, express and employ 3/3 energy conservation strategies during ADL's, functional mobility and light UE  ther ex//act with min VC's. OT Short Term Goal 5 - Progress (Week 1): Met Week 2:  OT Short Term Goal 1 (Week 2): STGs = LTGs  Skilled Therapeutic Interventions/Progress Updates:    Pt received in bed finishing up with colostomy care and education with his [place] RN.  Discussed with pt and RN about pt practicing to empty his bag in the toilet.  RN stated this is a much easier and sanitary method.   Pt was able to sit to EOB using bed rail and HOB elevated.  Later in session worked on sit to sidelying to supine and supine to sidelying to sit to his left on flat bed with no rail (as he would have at home).  Pt did extremely well with instruction using body mechanic techniques and no assist needed.    Pt used RW to ambulate to toilet with close S. Using grab bar to sit on low toilet, simulated how pt would empty his bag. It will be easiest to completely doff his shorts first.  Pt can sit far back on toilet to lean forward to empty it into basin.  He can also face toilet and straddle it.   He most likely need a grab bar on left wall at home as toilet is low and BSC is not a good option for purpose of emptying bag in toilet.  Sat in wc to practice donning socks and shoes.  He now  has enough flexibility in his R leg to cross his leg to don sock and shoe.  On his L leg, he practiced using sock aid without A, long shoe horn and reacher to fasten velcro fasteners.  Discussed option of step in shoes by [employer] or [employer].   Hopefully his left leg will gain flexibility soon.  Pt then ambulated back to bed and did not need A to get into bed.   Resting with all needs met, alarm set.    Therapy Documentation Precautions:  Precautions Precautions: Fall Precaution Comments: drains, colostomy, rectal drainage Restrictions Weight Bearing Restrictions: No  Pain: no c/o pain    ADL: ADL Eating: Independent Where Assessed-Eating: Wheelchair Grooming: Independent Where Assessed-Grooming: Wheelchair Upper  Body Bathing: Setup Where Assessed-Upper Body Bathing: Wheelchair Lower Body Bathing: Minimal assistance Where Assessed-Lower Body Bathing: Wheelchair Upper Body Dressing: Independent Where Assessed-Upper Body Dressing: Wheelchair Lower Body Dressing: Contact guard Where Assessed-Lower Body Dressing: Wheelchair Toileting: Moderate assistance Where Assessed-Toileting: Toilet (with empty ostomy bag at toilet) Toilet Transfer: Close supervision Toilet Transfer Method: Counselling psychologist: Energy manager: Not assessed Social research officer, government: Not assessed (will assess next session due to pt fearing lightheadedness) ADL Comments: pt uses sock aid and reacher to don clothing over LLE   Therapy/Group: Individual Therapy  Jb Dulworth 04/16/2022, 12:24 PM

## 2022-04-16 NOTE — Progress Notes (Signed)
Nutrition Follow-up  DOCUMENTATION CODES:   Not applicable  INTERVENTION:   Continue Multivitamin w/ minerals daily Continue Ensure Enlive po TID, each supplement provides 350 kcal and 20 grams of protein. Discontinue ProSource Plus  NUTRITION DIAGNOSIS:   Inadequate oral intake related to altered GI function, acute illness, decreased appetite as evidenced by meal completion < 50%, per patient/family report. 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 PMH includes HTN, GERD, former tobacco use, hernia repair  Pt resting in bed, RN at bedside giving meds.  Pt reports that he has been eating well, completing majority of his meals. Reports that he is even starting to feel hungry. He adds that he likes the Ensure and is drinking 2 per day, but does not like th ProSource. States that he was trying to take it but really does not like the taste. Discussed that he has 3 Ensure ordered per day and could drink all three and will discontinue ProSource; pt agreeable.   Per EMR, pt PO intake includes: 5/29: Lunch 0% 5/30: Breakfast 100% 5/31: Breakfast 100%, Lunch 100%, Dinner 25% 6/01: Breakfast 100%, Lunch 100%, Dinner 100%  Pt reports that he is getting tired of the food and the taste. Discussed that he is on a regular diet with thin liquids and can have family members bring in snacks or food from home that he likes.   Pt with no other questions or concerns at this time.   Medications reviewed and include: Augmentin, Vitamin C, Pepcid, Ferrous Sulfate, Imodium, MVI, Protonix,  Fibercon, IV antibiotics, IV antifungal, IV Zofran  Labs reviewed.  Diet Order:   Diet Order             Diet regular Room service appropriate? Yes; Fluid consistency: Thin  Diet effective now                   EDUCATION NEEDS:   No education needs have been identified at this time  Skin:  Skin Integrity Issues:: DTI, Incisions, Other (Comment) DTI: bilateral heels Incisions: rectum and abdomen Other: new colostomy  Last BM:  6/1 via colostomy  Height:  Ht Readings from Last 1 Encounters:  04/09/22 '5\' 9"'$  (1.753 m)   Weight:  Wt Readings from Last 1 Encounters:  04/09/22 75.1 kg   Ideal Body Weight:  72.7 kg  BMI:  Body mass index is 24.45 kg/m.  Estimated Nutritional Needs:  Kcal:  [id number] kcls Protein:  115-135 g Fluid:  >/= 2L    Hermina Barters RD, LDN Clinical Dietitian See [facility] for contact information.

## 2022-04-17 LAB — AEROBIC/ANAEROBIC CULTURE W GRAM STAIN (SURGICAL/DEEP WOUND)

## 2022-04-17 NOTE — Progress Notes (Signed)
PROGRESS NOTE   Subjective/Complaints: No fevers overnight, but has had a febrile event overnight on 04/14/22.  Reports that his sacral wound on left buttocks has had green drainage today especially since it came loose and is due to change.  Review of Systems  Constitutional:  No fever today.      Fever and diaphoresis overnight 04/09/22-04/10/22 have resolved.  HENT:  Denies sinus pain or congestion.  Eyes: Negative.   Respiratory:  Negative for shortness of breath or cough.   Cough and shortness of breath overnight 04/09/22-04/10/22 have resolved.  Cardiovascular:  Negative for chest pain and palpitations.  Gastrointestinal:  Negative for nausea and vomiting.  Genitourinary:  Positive for urgency. Negative for dysuria.  Musculoskeletal:  Negative for myalgia. Neurological:  Negative for headache, focal weakness, or dizziness. Endo/Heme/Allergies: Negative.   Psychiatric/Behavioral:  Negative for depression/insomnia.  Patient is nervous about current medical condition and recent health changes.  Objective:   No results found. Recent Labs    04/15/22 0936  WBC 9.4  HGB 8.1*  HCT 24.3*  PLT 304   Recent Labs    04/16/22 0434  CREATININE 0.93    Intake/Output Summary (Last 24 hours) at 04/17/2022 1427 Last data filed at 04/17/2022 0800 Gross per 24 hour  Intake 477 ml  Output 1915 ml  Net -1438 ml     Pressure Injury 04/09/22 Heel Left Deep Tissue Pressure Injury - Purple or maroon localized area of discolored intact skin or blood-filled blister due to damage of underlying soft tissue from pressure and/or shear. (Active)  04/09/22 1315  Location: Heel  Location Orientation: Left  Staging: Deep Tissue Pressure Injury - Purple or maroon localized area of discolored intact skin or blood-filled blister due to damage of underlying soft tissue from pressure and/or shear.  Wound Description (Comments):   Present on Admission:  Yes     Pressure Injury 04/09/22 Heel Right Deep Tissue Pressure Injury - Purple or maroon localized area of discolored intact skin or blood-filled blister due to damage of underlying soft tissue from pressure and/or shear. (Active)  04/09/22 1315  Location: Heel  Location Orientation: Right  Staging: Deep Tissue Pressure Injury - Purple or maroon localized area of discolored intact skin or blood-filled blister due to damage of underlying soft tissue from pressure and/or shear.  Wound Description (Comments):   Present on Admission: Yes    Physical Exam: Vital Signs Blood pressure 119/82, pulse (!) 106, temperature 98.6 F (37 C), resp. rate 16, height '5\' 9"'$  (1.753 m), weight 75.1 kg, SpO2 98 %.  General: Alert and oriented x3. NAD, at sink getting washed up. HEENT: Head, normocephalic, atraumatic, PERRLA, EOMI, sclera aniecteric, oral mucosa pink and moist Neck: supple Heart: regular rate, rhythm, no rubs, extra heart sounds Chest: CTA bilaterally, normal effort, no RRW Abdomen: soft, positive bowel sounds, minimal tenderness,  Colostomy viable with stool present. Comments: Pelvis has JP drains x2 with small amount of drainage (yellow-white). Skin: slightly erythematous, skin intact at former drain site. Stoma around colostomy intact.  Heel protectors on both heels.  There is a small amount of redness on the lateral aspect of the left heel and medial aspect of  the right heel. Buttocks have left wound covered with dressing which is partially removed.  Inspected site there is some saturation of dressing greenish drainage.  Wound dressing to be changed per WOC recommendations.  Wound bed is reddish-pink with granulation tissue 3 cm in length 1 and left 1.5 in depth. Extremities: No clubbing, edema, or cyanosis. +2 pulses. Neuro: Alert and oriented x3, CN II-XII intact, no focal deficits. Musculoskeletal: FROM all 4 extremities.  No joint swelling. Strength: BUE/BLE 4+/5 overall.  Sensory: LT  intact in all 4 extremities. Psych: Pleasant, cooperative.  Assessment/Plan: 1. Functional deficits which require 3+ hours per day of interdisciplinary therapy in a comprehensive inpatient rehab setting. Physiatrist is providing close team supervision and 24 hour management of active medical problems listed below. Physiatrist and rehab team continue to assess barriers to discharge/monitor patient progress toward functional and medical goals  Care Tool:  Bathing    Body parts bathed by patient: Right arm, Left arm, Chest, Abdomen, Face, Front perineal area, Right upper leg, Left upper leg   Body parts bathed by helper: Buttocks, Right lower leg, Left lower leg     Bathing assist Assist Level: Moderate Assistance - Patient 50 - 74%     Upper Body Dressing/Undressing Upper body dressing   What is the patient wearing?: Hospital gown only    Upper body assist Assist Level: Contact Guard/Touching assist    Lower Body Dressing/Undressing Lower body dressing      What is the patient wearing?: Incontinence brief     Lower body assist Assist for lower body dressing: Maximal Assistance - Patient 25 - 49%     Toileting Toileting    Toileting assist Assist for toileting: Minimal Assistance - Patient > 75% (urinal use- has colostomy with total A for care)     Transfers Chair/bed transfer  Transfers assist     Chair/bed transfer assist level: Contact Guard/Touching assist     Locomotion Ambulation   Ambulation assist      Assist level: Contact Guard/Touching assist Assistive device: Walker-rolling Max distance: 150'   Walk 10 feet activity   Assist     Assist level: Contact Guard/Touching assist Assistive device: Walker-rolling   Walk 50 feet activity   Assist Walk 50 feet with 2 turns activity did not occur: Safety/medical concerns  Assist level: Contact Guard/Touching assist Assistive device: Walker-rolling    Walk 150 feet activity   Assist Walk  150 feet activity did not occur: Safety/medical concerns  Assist level: Contact Guard/Touching assist Assistive device: Walker-rolling    Walk 10 feet on uneven surface  activity   Assist Walk 10 feet on uneven surfaces activity did not occur: Safety/medical concerns         Wheelchair     Assist Is the patient using a wheelchair?: Yes Type of Wheelchair: Manual    Wheelchair assist level: Supervision/Verbal cueing Max wheelchair distance: 150    Wheelchair 50 feet with 2 turns activity    Assist        Assist Level: Supervision/Verbal cueing   Wheelchair 150 feet activity     Assist      Assist Level: Supervision/Verbal cueing   Blood pressure 119/82, pulse (!) 106, temperature 98.6 F (37 C), resp. rate 16, height '5\' 9"'$  (1.753 m), weight 75.1 kg, SpO2 98 %.   Medical Problem List and Plan: 1. Functional deficits secondary to debility secondary to pelvic abscess.  Status post laparoscopy, abdominal washout drain placement with creation of diverting colostomy 03/20/2022             -  patient may shower but incisions/drains should be covered             -ELOS/Goals: 10-14 days modI             -Continue PT/OT             -Walked 50 feet with PT             -Team conference yesterday expected discharge 04/23/22             -he worked on Sport and exercise psychologist and socks today with OT 2.  Antithrombotics: -DVT/anticoagulation:  Pharmaceutical: Lovenox.  Venous Doppler negative             -antiplatelet therapy: N/A 5/27 Continue Lovenox 3. Pain Management: Robaxin 1000 mg 3 times daily, tramadol as needed 5/31- Pain continues to be controlled, continue PRN tramadol 6/1 Discussed use of pain meds when needed to stay ahead of pain for therapy 6/3 Pain well controlled today. 4. Mood: Ativan as needed as well as trazodone             -antipsychotic agents: N/A 5. Neuropsych: This patient is capable of making decisions on his own behalf. 6. Skin/Wound Care: Routine  skin checks with colostomy education 7. Fluids/Electrolytes/Nutrition: Routine in and outs with follow-up chemistries 5/27 Slightly low sodium (133) and potassium (3.4), continue to trend, consider repleting 5/30 Na up to 135 and K wnl after repletion 6/3 No issues with electrolytes today    Latest Ref Rng & Units 04/16/2022    4:34 AM 04/13/2022    4:20 AM 04/12/2022    6:44 AM  BMP  Glucose 70 - 99 mg/dL  105   98    BUN 8 - 23 mg/dL  10   8    Creatinine 0.61 - 1.24 mg/dL 0.93   1.00   1.00    Sodium 135 - 145 mmol/L  135   132    Potassium 3.5 - 5.1 mmol/L  4.0   3.4    Chloride 98 - 111 mmol/L  103   99    CO2 22 - 32 mmol/L  26   25    Calcium 8.9 - 10.3 mg/dL  8.4   8.2      8.  ID/intra-abdominal infection.  Continue micafungin 100 mg daily as well as Zosyn 3.375 g every 8 hours.  Plan is for 6 weeks starting date 5/16 ending 05/11/2022 and will need repeat CT abdomen/pelvis on left thigh in 1 to 2 weeks -5/27 Pt has event last night on 5/26 of temp 101.6, HR 115, new onset cough, with shortness of breath.  Per night nurse on 5/26-5/27 became diaphoretic around 0000 on 5/27 and drenched the bed with sweat, with no subsequent fevers or episodes of tachycardia. Orders were placed for CBC, BMET, blood cultures x2, CXR, COVID, respiratory panel, and repeat CT abd/pelvis. ID was consulted the evening of 5/26 with recommendation given to continue current regimen of 6 week Zosyn and micafungin based on original plan.  Per [place], CT abdomen/pelvis indicated with febrile event, especially in light that patient's last CT scan was 04/05/22 and he inadvertently lost one of his JP drains on the morning of 04/06/22. -Respiratory panel and COVID negative.  CXR shows Small left pleural effusion with atelectasis at the left lung base. -CT abd/pelvis: demonstrates a complex left retroperitoneal fluid collection, with superior and inferior components that appear to communicate overlying the Gerota's  fascia and left psoas musculature. ID  to round on pt on 04/11/22 and will decide next steps. 04/11/22 See ID and Radiology note.  IR was consulted for intra-abdominal fluid collection. Dr. Denna Haggard (IR) has approved patient for aspiration of inferior portion of fluid collection. -Per Radiology note, PA will reach out to General Surgery (Dr. Marcello Moores) to notify of plan. Also, called Dr. Rosendo Gros, MD on-call for General Surgery this weekend to update office on patient since surgery signed off on 04/09/22.  I was unable to reach provider since he was in-procedure.  But relayed message by telephone to nurse, Karena Addison, [place] of patient's febrile episode 04/09/22-04/10/22 and plans for abdominal fluid removal and possible drain placement by IR. 5/29 IR completed aspiration of 26m of purulent material, no drain placed 5/29 ID continuing Zosyn, micafungin 5/31 culture of L flank abscess with GNR and gram pos cocci 6/1 ID changing zosyn to Ceftriaxone and Augmentin, continuing Micafungin, continue to follow 6/2 no fevers overnight, follow 9.  Postoperative anemia.  Continue iron supplement.  Follow-up CBC 5/28 Hgb 8.2 low but stable, continue to trend and monitor. 5/29 Hgb stable at 8.0, follow 6/3 Hgb 8.1, stable    Latest Ref Rng & Units 04/15/2022    9:36 AM 04/12/2022    6:44 AM 04/10/2022    2:50 AM  CBC  WBC 4.0 - 10.5 K/uL 9.4   6.3   5.5    Hemoglobin 13.0 - 17.0 g/dL 8.1   8.0   8.2    Hematocrit 39.0 - 52.0 % 24.3   23.8   23.1    Platelets 150 - 400 K/uL 304   310   301     10.  Hypertension.  Continue Zebeta 10 mg nightly,   Monitor with increased mobility 5/20 BP well controlled, continue to monitor, continue zebeta 6/1 Well controlled, follow 6/3 stable SBP range 119-141    04/17/2022    1:37 PM 04/17/2022    5:54 AM 04/16/2022    7:35 PM  Vitals with BMI  Systolic [id number] Diastolic 82 94 83  Pulse [id number] 96   11.  Decreased nutritional storage.  Dietary follow-up. Continue Prosource.  12.   Urinary retention.  Foley tube removed and urinating.  Check PVR 5/27 Pt is voiding okay, reported that he had some urinary incontinence for the first day or two immediately following Foley removal, which has since resolved. 5/31 reports no difficulty voiding, follow  13. Hypokalemia             -5/29KCL today, repeat labs             -5/30 K+ wnl now             6/3 K+, remains wnl's 14. Hypoalbuminemia             -Continue ensure supplementation             -Discussed healthy diet, He ate all his breakfast today 5/30 15. Wound at left lower buttocks site of previous drain             -5/20 WOC nurse consult recommends aquacel Ag with dry gauze and silicone foam. Continue to follow 6/3 Patient reports increased volume of greenish discharge from left buttock wound saturating dressing.  Wound bed appears pinkish red length 3 cm, width 1.5 cm, depth not measured per patient request but appears about 1.5-2 cm.  WBC's normal, afebrile.  Called ID to make awake, spoke with on-call MD MLaurice Record MD.  SMichela Pitcher  that ID will round and follow-up on 04/18/22. 16. Asthma,              -Using albuterol inhaler, monitor, improved today             -6/1 Reports breathing continues to be improved              6/4 No reported issues today.  LOS: 8 days A FACE TO FACE EVALUATION WAS PERFORMED  Luetta Nutting 04/17/2022, 2:27 PM

## 2022-04-18 ENCOUNTER — Inpatient Hospital Stay (HOSPITAL_COMMUNITY): Payer: BC Managed Care – PPO

## 2022-04-18 DIAGNOSIS — A419 Sepsis, unspecified organism: Secondary | ICD-10-CM

## 2022-04-18 LAB — CBC WITH DIFFERENTIAL/PLATELET
Abs Immature Granulocytes: 0.52 10*3/uL — ABNORMAL HIGH (ref 0.00–0.07)
Basophils Absolute: 0.1 10*3/uL (ref 0.0–0.1)
Basophils Relative: 0 %
Eosinophils Absolute: 0.2 10*3/uL (ref 0.0–0.5)
Eosinophils Relative: 1 %
HCT: 25.2 % — ABNORMAL LOW (ref 39.0–52.0)
Hemoglobin: 8.2 g/dL — ABNORMAL LOW (ref 13.0–17.0)
Immature Granulocytes: 4 %
Lymphocytes Relative: 27 %
Lymphs Abs: 4.1 10*3/uL — ABNORMAL HIGH (ref 0.7–4.0)
MCH: 29.9 pg (ref 26.0–34.0)
MCHC: 32.5 g/dL (ref 30.0–36.0)
MCV: 92 fL (ref 80.0–100.0)
Monocytes Absolute: 1.1 10*3/uL — ABNORMAL HIGH (ref 0.1–1.0)
Monocytes Relative: 8 %
Neutro Abs: 9 10*3/uL — ABNORMAL HIGH (ref 1.7–7.7)
Neutrophils Relative %: 60 %
Platelets: 347 10*3/uL (ref 150–400)
RBC: 2.74 MIL/uL — ABNORMAL LOW (ref 4.22–5.81)
RDW: 14.6 % (ref 11.5–15.5)
WBC: 15 10*3/uL — ABNORMAL HIGH (ref 4.0–10.5)
nRBC: 0 % (ref 0.0–0.2)

## 2022-04-18 MED ORDER — IOHEXOL 300 MG/ML  SOLN
100.0000 mL | Freq: Once | INTRAMUSCULAR | Status: AC | PRN
  Administered 2022-04-18: 100 mL via INTRAVENOUS

## 2022-04-18 MED ORDER — IOHEXOL 9 MG/ML PO SOLN
ORAL | Status: AC
  Administered 2022-04-18: 500 mL
  Filled 2022-04-18: qty 1000

## 2022-04-18 NOTE — Progress Notes (Signed)
Lab collected CBC this am prior to start of dose of daily iv rocephin Lab resulted at 9:25 with elevated white count and differential MD notified with orders to call infectious disease this nurse notified by infectious disease that the patient is on their rounding list and will be seen by the on call ID provider. NP notified of updates

## 2022-04-18 NOTE — Progress Notes (Signed)
[facility]  Date of Admission:  04/09/2022   Principal Problem:   Abscess, intra-abdominal, postoperative Active Problems:   Debility   Sepsis due to Enterococcus ([place])   E coli infection          Assessment: 71 year old male admitted with:  #Intra-abdominal infection -Admitted with sepsis following initial transanal hemorrhoidal BR derealization/21/23.  He had presented to [facility] on 4/25 with abdominal pain and fever. - Taken to the OR with findings noting necrosis of the anoderm posteriorly on EUA and large right gluteal abscess which was opened and drained. - Repeat CT on 5/6 revealed significant flank and retroperitoneal contamination.  Taken back to the OR on 5/6 noted to have significant infection status post creation-colostomy and drain placement x3 as well as Penrose drain in the peritoneal space.  Or cultures grew E. coli, E faecalis, mixed anaerobes. - 5/12 TTE revealed edema/inflammation and soft tissue gas in the pelvic floor parasacral space and surgical drain in place.  5.3 X2.5 X7.2X centimeters fluid collection in the left upper quadrant was noted as well as intramuscular/edema in the thighs left greater than right concerning for cellulitis/myositis - IR consulted for possible draining left upper quadrant.  It was deemed too risky due to anatomical position. - CT on 5/14 was notable for overall stability in the pelvis.  Left upper quadrant fluid collection decreased in size since prior study and was felt to be contiguous with left lower quadrant surgical drain. - CT on 5/22 showed a new defined left pelvic wall 5 cm fluid collection. - 5/24 left quadrant PERC drain with Dislodged.  Admitted CIR on 5/26.  Supple sequently developed fevers 101.6 on 5/26. - CT on 5/27 showed complex left retroperitoneal fluid collection with superior and inferior components that appear to communicate with Gerota's fascia and left psoas musculature.  Status  post IR aspiration 5/29 growing E. Coli(RAMP, Unasyn, cefazolin and pip-tazo) -526 CT showed additional parasacral air and fluid collection - Starting 6/3 noted to have increased drainage(green) from left buttock wound. Wbc 15K this AM(trended up). Will order CT to reassess. On my exam I did not appreciate any local erythema around buttock wound.   #Intramuscular left thigh fluid collection -CT on 514 noted progression of intramuscular fluid collection within the left thigh concerning for intramuscular abscess. - Status post IR aspiration/drain on 516 with cultures growing Candida albicans.  Repeat CT of thigh planned for the first week of June. Recommendations: -CT abd/pelvis and femur to reassess fluid collections -Continue Ceftriaxone(Ecoli) -Augmentin(target anerobes and Efaecalis) -Micafungin(Target candida)  Microbiology:   Antibiotics: Current Ceftriaxone Augmentin(target anaerobes and Efaecalis) Micafungin   Cultures: Blood 5/3 NG   Other 5/29 Left flank abscess ecoli 5/16 left flank-candida albicans 6/6 Tissue Cx: Ecoli (R amp, I Unasyn)E.faecalis, mixed anaerobic flora anaerobes SUBJECTIVE: Resting in bed. Denies fevers and chills. Reports he just wants to "get better".   Interval: Afebrile overnight. Wbc 15k Review of Systems: ROS   Scheduled Meds:  amoxicillin-clavulanate  1 tablet Oral Q12H   vitamin C  500 mg Oral BID   bisoprolol  10 mg Oral QHS   Chlorhexidine Gluconate Cloth  6 each Topical Daily   enoxaparin (LOVENOX) injection  40 mg Subcutaneous Q24H   famotidine  20 mg Oral BID   feeding supplement  237 mL Oral TID BM   ferrous sulfate  325 mg Oral BID WC   lip balm  1 application. Topical BID  loperamide  2 mg Oral TID with meals   methocarbamol  1,000 mg Oral TID   montelukast  10 mg Oral QHS   multivitamin with minerals  1 tablet Oral Daily   pantoprazole  40 mg Oral Daily   polycarbophil  625 mg Oral BID   sodium chloride flush  10-40 mL  Intracatheter Q12H   Continuous Infusions:  cefTRIAXone (ROCEPHIN)  IV Stopped (04/17/22 1059)   micafungin (MYCAMINE) IV Stopped (04/17/22 1344)   ondansetron (ZOFRAN) IV     PRN Meds:.acetaminophen, albuterol, alum & mag hydroxide-simeth, azelastine, ibuprofen, LORazepam, ondansetron (ZOFRAN) IV **OR** ondansetron (ZOFRAN) IV, ondansetron, simethicone, sodium chloride flush, traMADol, traZODone Allergies  Allergen Reactions   Fluconazole Hives, Rash and Other (See Comments)    Shingles activated    Griseofulvin Anaphylaxis, Swelling, Rash and Other (See Comments)    Throat swelling     Sulfa Antibiotics Other (See Comments)    Joints ache, swell and caused inflammation    Oxycodone Nausea Only and Other (See Comments)    Nauseous to the point of almost vomiting   Mometasone Furo-Formoterol Fum Other (See Comments)    Lack of therapeutic effect    Retapamulin Rash    OBJECTIVE: Vitals:   04/16/22 1935 04/17/22 0554 04/17/22 1337 04/17/22 1958  BP: 137/83 (!) 141/94 119/82 126/86  Pulse: 96 99 (!) 106 99  Resp: '18 18 16 18  '$ Temp: 99.1 F (37.3 C) 98.3 F (36.8 C) 98.6 F (37 C) 99.4 F (37.4 C)  TempSrc: Oral Oral  Oral  SpO2: 96% 96% 98% 100%  Weight:      Height:       Body mass index is 24.45 kg/m.  Physical Exam Constitutional:      General: He is not in acute distress.    Appearance: He is normal weight. He is not toxic-appearing.  HENT:     Head: Normocephalic and atraumatic.     Right Ear: External ear normal.     Left Ear: External ear normal.     Nose: No congestion or rhinorrhea.     Mouth/Throat:     Mouth: Mucous membranes are moist.     Pharynx: Oropharynx is clear.  Eyes:     Extraocular Movements: Extraocular movements intact.     Conjunctiva/sclera: Conjunctivae normal.     Pupils: Pupils are equal, round, and reactive to light.  Cardiovascular:     Rate and Rhythm: Normal rate and regular rhythm.     Heart sounds: No murmur heard.    No friction rub. No gallop.  Pulmonary:     Effort: Pulmonary effort is normal.     Breath sounds: Normal breath sounds.  Abdominal:     General: Abdomen is flat. Bowel sounds are normal.     Palpations: Abdomen is soft.     Comments: Rt abdominal drains without surrounding erythema at drain insertion site. Left buttock wound without surrounding erythema.    Musculoskeletal:        General: No swelling. Normal range of motion.     Cervical back: Normal range of motion and neck supple.  Skin:    General: Skin is warm and dry.  Neurological:     General: No focal deficit present.     Mental Status: He is oriented to person, place, and time.  Psychiatric:        Mood and Affect: Mood normal.      Lab Results Lab Results  Component Value Date   WBC  9.4 04/15/2022   HGB 8.1 (L) 04/15/2022   HCT 24.3 (L) 04/15/2022   MCV 92.0 04/15/2022   PLT 304 04/15/2022    Lab Results  Component Value Date   CREATININE 0.93 04/16/2022   BUN 10 04/13/2022   NA 135 04/13/2022   K 4.0 04/13/2022   CL 103 04/13/2022   CO2 26 04/13/2022    Lab Results  Component Value Date   ALT 36 04/13/2022   AST 28 04/13/2022   ALKPHOS 287 (H) 04/13/2022   BILITOT 0.5 04/13/2022        Laurice Record, [facility] 04/18/2022, 8:53 AM

## 2022-04-18 NOTE — Progress Notes (Signed)
PROGRESS NOTE   Subjective/Complaints: No fevers overnight, but has had a febrile event overnight on 04/14/22.  Reports that his sacral wound on left buttocks has had green drainage with increased drainage over the past two days.  Also received report from nurse that labs came back with elevated WBC's (15.0).  Patient afebrile but has mild tachycardia (HR 106-107).  ID notified and aware of WBC spike and wound drainage. They are continuing to follow.   Review of Systems  Constitutional:  No fever today.      Fever and diaphoresis overnight 04/09/22-04/10/22 have resolved. HENT:  Denies sinus pain or congestion.  Eyes: Negative.   Respiratory:  Negative for shortness of breath or cough.   Cough and shortness of breath overnight 04/09/22-04/10/22 have resolved.  Cardiovascular:  Negative for chest pain and palpitations. Mild tachycardia. Gastrointestinal:  Negative for nausea and vomiting.  Genitourinary:  Positive for urgency. Negative for dysuria.  Musculoskeletal:  Negative for myalgia. Neurological:  Negative for headache, focal weakness, or dizziness. Endo/Heme/Allergies: Negative.   Psychiatric/Behavioral:  Negative for depression/insomnia.  Patient is nervous about current medical condition and recent health changes.  Objective:   No results found. Recent Labs    04/18/22 0925  WBC 15.0*  HGB 8.2*  HCT 25.2*  PLT 347   Recent Labs    04/16/22 0434  CREATININE 0.93    Intake/Output Summary (Last 24 hours) at 04/18/2022 1359 Last data filed at 04/18/2022 1303 Gross per 24 hour  Intake 2340 ml  Output 1175 ml  Net 1165 ml     Pressure Injury 04/09/22 Heel Left Deep Tissue Pressure Injury - Purple or maroon localized area of discolored intact skin or blood-filled blister due to damage of underlying soft tissue from pressure and/or shear. (Active)  04/09/22 1315  Location: Heel  Location Orientation: Left  Staging:  Deep Tissue Pressure Injury - Purple or maroon localized area of discolored intact skin or blood-filled blister due to damage of underlying soft tissue from pressure and/or shear.  Wound Description (Comments):   Present on Admission: Yes     Pressure Injury 04/09/22 Heel Right Deep Tissue Pressure Injury - Purple or maroon localized area of discolored intact skin or blood-filled blister due to damage of underlying soft tissue from pressure and/or shear. (Active)  04/09/22 1315  Location: Heel  Location Orientation: Right  Staging: Deep Tissue Pressure Injury - Purple or maroon localized area of discolored intact skin or blood-filled blister due to damage of underlying soft tissue from pressure and/or shear.  Wound Description (Comments):   Present on Admission: Yes    Physical Exam: Vital Signs Blood pressure 124/85, pulse (!) 107, temperature 99.3 F (37.4 C), temperature source Oral, resp. rate 20, height '5\' 9"'$  (1.753 m), weight 75.1 kg, SpO2 100 %.  General: Alert and oriented x3. NAD, at sink getting washed up. HEENT: Head, normocephalic, atraumatic, PERRLA, EOMI, sclera aniecteric, oral mucosa pink and moist Neck: supple Heart: Mild tachycardia (HR 106-107), no rubs, extra heart sounds Chest: CTA bilaterally, normal effort, no RRW Abdomen: soft, positive bowel sounds, minimal tenderness,  Colostomy viable with stool present. Comments: Pelvis has JP drains x2 with small amount of  drainage (yellow-white). Dressing change at [place] drain site done today by bedside nurse. Skin: slightly erythematous, skin intact at former drain site. Stoma around colostomy intact.  Heel protectors on both heels.  There is a small amount of redness on the lateral aspect of the left heel and medial aspect of the right heel. Buttocks have left wound covered with dressing which is partially removed.  Inspected site there is some saturation of dressing greenish drainage.  Wound dressing to be changed per WOC  recommendations.  Wound bed is reddish-pink with granulation tissue 3 cm in length 1 and left 1.5 in depth. Dressing change done today by bedside nurse. Extremities: No clubbing, edema, or cyanosis. +2 pulses. Neuro: Alert and oriented x3, CN II-XII intact, no focal deficits. Musculoskeletal: FROM all 4 extremities.  No joint swelling. Strength: BUE/BLE 4+/5 overall.  Sensory: LT intact in all 4 extremities. Psych: Pleasant, cooperative.  Assessment/Plan: 1. Functional deficits which require 3+ hours per day of interdisciplinary therapy in a comprehensive inpatient rehab setting. Physiatrist is providing close team supervision and 24 hour management of active medical problems listed below. Physiatrist and rehab team continue to assess barriers to discharge/monitor patient progress toward functional and medical goals  Care Tool:  Bathing    Body parts bathed by patient: Right arm, Left arm, Chest, Abdomen, Face, Front perineal area, Right upper leg, Left upper leg   Body parts bathed by helper: Buttocks, Right lower leg, Left lower leg     Bathing assist Assist Level: Moderate Assistance - Patient 50 - 74%     Upper Body Dressing/Undressing Upper body dressing   What is the patient wearing?: Hospital gown only    Upper body assist Assist Level: Contact Guard/Touching assist    Lower Body Dressing/Undressing Lower body dressing      What is the patient wearing?: Incontinence brief     Lower body assist Assist for lower body dressing: Maximal Assistance - Patient 25 - 49%     Toileting Toileting    Toileting assist Assist for toileting: Minimal Assistance - Patient > 75% (urinal use- has colostomy with total A for care)     Transfers Chair/bed transfer  Transfers assist     Chair/bed transfer assist level: Contact Guard/Touching assist     Locomotion Ambulation   Ambulation assist      Assist level: Contact Guard/Touching assist Assistive device:  Walker-rolling Max distance: 150'   Walk 10 feet activity   Assist     Assist level: Contact Guard/Touching assist Assistive device: Walker-rolling   Walk 50 feet activity   Assist Walk 50 feet with 2 turns activity did not occur: Safety/medical concerns  Assist level: Contact Guard/Touching assist Assistive device: Walker-rolling    Walk 150 feet activity   Assist Walk 150 feet activity did not occur: Safety/medical concerns  Assist level: Contact Guard/Touching assist Assistive device: Walker-rolling    Walk 10 feet on uneven surface  activity   Assist Walk 10 feet on uneven surfaces activity did not occur: Safety/medical concerns         Wheelchair     Assist Is the patient using a wheelchair?: Yes Type of Wheelchair: Manual    Wheelchair assist level: Supervision/Verbal cueing Max wheelchair distance: 150    Wheelchair 50 feet with 2 turns activity    Assist        Assist Level: Supervision/Verbal cueing   Wheelchair 150 feet activity     Assist      Assist Level: Supervision/Verbal  cueing   Blood pressure 124/85, pulse (!) 107, temperature 99.3 F (37.4 C), temperature source Oral, resp. rate 20, height '5\' 9"'$  (1.753 m), weight 75.1 kg, SpO2 100 %.   Medical Problem List and Plan: 1. Functional deficits secondary to debility secondary to pelvic abscess.  Status post laparoscopy, abdominal washout drain placement with creation of diverting colostomy 03/20/2022             -patient may shower but incisions/drains should be covered             -ELOS/Goals: 10-14 days modI             -Continue PT/OT             -Walked 50 feet with PT             -Team conference yesterday expected discharge 04/23/22             -he worked on Sport and exercise psychologist and socks today with OT 2.  Antithrombotics: -DVT/anticoagulation:  Pharmaceutical: Lovenox.  Venous Doppler negative             -antiplatelet therapy: N/A 5/27 Continue Lovenox 3. Pain  Management: Robaxin 1000 mg 3 times daily, tramadol as needed 5/31- Pain continues to be controlled, continue PRN tramadol 6/1 Discussed use of pain meds when needed to stay ahead of pain for therapy 6/4 Pain well controlled today. 4. Mood: Ativan as needed as well as trazodone             -antipsychotic agents: N/A 5. Neuropsych: This patient is capable of making decisions on his own behalf. 6. Skin/Wound Care: Routine skin checks with colostomy education 7. Fluids/Electrolytes/Nutrition: Routine in and outs with follow-up chemistries 5/27 Slightly low sodium (133) and potassium (3.4), continue to trend, consider repleting 5/30 Na up to 135 and K wnl after repletion 6/4 No issues with electrolytes today    Latest Ref Rng & Units 04/16/2022    4:34 AM 04/13/2022    4:20 AM 04/12/2022    6:44 AM  BMP  Glucose 70 - 99 mg/dL  105   98    BUN 8 - 23 mg/dL  10   8    Creatinine 0.61 - 1.24 mg/dL 0.93   1.00   1.00    Sodium 135 - 145 mmol/L  135   132    Potassium 3.5 - 5.1 mmol/L  4.0   3.4    Chloride 98 - 111 mmol/L  103   99    CO2 22 - 32 mmol/L  26   25    Calcium 8.9 - 10.3 mg/dL  8.4   8.2      8.  ID/intra-abdominal infection.  Continue micafungin 100 mg daily as well as Zosyn 3.375 g every 8 hours.  Plan is for 6 weeks starting date 5/16 ending 05/11/2022 and will need repeat CT abdomen/pelvis on left thigh in 1 to 2 weeks -5/27 Pt has event last night on 5/26 of temp 101.6, HR 115, new onset cough, with shortness of breath.  Per night nurse on 5/26-5/27 became diaphoretic around 0000 on 5/27 and drenched the bed with sweat, with no subsequent fevers or episodes of tachycardia. Orders were placed for CBC, BMET, blood cultures x2, CXR, COVID, respiratory panel, and repeat CT abd/pelvis. ID was consulted the evening of 5/26 with recommendation given to continue current regimen of 6 week Zosyn and micafungin based on original plan.  Per [place], CT abdomen/pelvis indicated  with  febrile event, especially in light that patient's last CT scan was 04/05/22 and he inadvertently lost one of his JP drains on the morning of 04/06/22. -Respiratory panel and COVID negative.  CXR shows Small left pleural effusion with atelectasis at the left lung base. -CT abd/pelvis: demonstrates a complex left retroperitoneal fluid collection, with superior and inferior components that appear to communicate overlying the Gerota's fascia and left psoas musculature. ID to round on pt on 04/11/22 and will decide next steps. 04/11/22 See ID and Radiology note.  IR was consulted for intra-abdominal fluid collection. Dr. Denna Haggard (IR) has approved patient for aspiration of inferior portion of fluid collection. -Per Radiology note, PA will reach out to General Surgery (Dr. Marcello Moores) to notify of plan. Also, called Dr. Rosendo Gros, MD on-call for General Surgery this weekend to update office on patient since surgery signed off on 04/09/22.  I was unable to reach provider since he was in-procedure.  But relayed message by telephone to nurse, Karena Addison, [place] of patient's febrile episode 04/09/22-04/10/22 and plans for abdominal fluid removal and possible drain placement by IR. 5/29 IR completed aspiration of 29m of purulent material, no drain placed 5/29 ID continuing Zosyn, micafungin 5/31 culture of L flank abscess with GNR and gram pos cocci 6/1 ID changing zosyn to Ceftriaxone and Augmentin, continuing Micafungin, continue to follow 6/2 no fevers overnight, follow 9.  Postoperative anemia.  Continue iron supplement.  Follow-up CBC 5/28 Hgb 8.2 low but stable, continue to trend and monitor. 5/29 Hgb stable at 8.0, follow 6/3 Hgb 8.1, stable    Latest Ref Rng & Units 04/18/2022    9:25 AM 04/15/2022    9:36 AM 04/12/2022    6:44 AM  CBC  WBC 4.0 - 10.5 K/uL 15.0   9.4   6.3    Hemoglobin 13.0 - 17.0 g/dL 8.2   8.1   8.0    Hematocrit 39.0 - 52.0 % 25.2   24.3   23.8    Platelets 150 - 400 K/uL 347   304   310     10.   Hypertension.  Continue Zebeta 10 mg nightly,   Monitor with increased mobility 5/20 BP well controlled, continue to monitor, continue zebeta 6/1 Well controlled, follow 6/4 stable SBP range 119-141    04/18/2022    1:24 PM 04/17/2022    7:58 PM 04/17/2022    1:37 PM  Vitals with BMI  Systolic [id number] Diastolic 85 86 82  Pulse [id number] 106   11.  Decreased nutritional storage.  Dietary follow-up. Continue Prosource.  12.  Urinary retention.  Foley tube removed and urinating.  Check PVR 5/27 Pt is voiding okay, reported that he had some urinary incontinence for the first day or two immediately following Foley removal, which has since resolved. 5/31 reports no difficulty voiding, follow  13. Hypokalemia             -5/29KCL today, repeat labs             -5/30 K+ wnl now             6/3 K+, remains wnl's, 4.0, on 5/30 14. Hypoalbuminemia             -Continue ensure supplementation             -Discussed healthy diet, He ate all his breakfast today 5/30 15. Wound at left lower buttocks site of previous drain             -  5/20 [place] nurse consult recommends aquacel Ag with dry gauze and silicone foam. Continue to follow 6/3 Patient reports increased volume of greenish discharge from left buttock wound saturating dressing.  Wound bed appears pinkish red length 3 cm, width 1.5 cm, depth not measured per patient request but appears about 1.5-2 cm.  WBC's normal, afebrile.  Called ID to make awake, spoke with on-call MD Laurice Record, MD.  Michela Pitcher that ID will round and follow-up on 04/18/22. 6/4 Wound site still is producing greenish drainage, ID was notified yesterday and again today with WBC spike of 15.0. ID to round on and follow up on patient. 16. Asthma,              -Using albuterol inhaler, monitor, improved today             -6/1 Reports breathing continues to be improved              6/4 No reported issues today.  LOS: 9 days A FACE TO FACE EVALUATION WAS PERFORMED  Luetta Nutting 04/18/2022, 1:59 PM

## 2022-04-18 NOTE — Progress Notes (Signed)
Pt taken to CT by Transport. Ticket to ride printed.

## 2022-04-18 NOTE — Progress Notes (Signed)
Pt c/o coughing and wheezing, requesting breathing treatment. PRN Neb administered and effective.

## 2022-04-18 NOTE — Progress Notes (Signed)
Physical Therapy Session Note  Patient Details  Name: Tyler [name] MRN: [id number] Date of Birth: [birth year]  Today's Date: 04/18/2022 PT Individual Time: 0800-0900 PT Individual Time Calculation (min): 60 min   Short Term Goals: Week 2:  PT Short Term Goal 1 (Week 2): = LTGS due to LOS PT Short Term Goal 2 (Week 2): =LTGs due to LOS PT Short Term Goal 3 (Week 2): =LTGs due to LOS PT Short Term Goal 4 (Week 2): =LTGs due to LOS  Skilled Therapeutic Interventions/Progress Updates:    Pt received supine in bed, agreeable to PT session. No complaints of pain. Supine to sit with Supervision from flat bed with use of bedrail. Pt requires assist to don socks and shoes while seated EOB, setup A to don shorts. Sit to stand with CGA to RW during session, pt able to pull pants up over hips in standing. Ambulation 2 x 150 ft with RW and close Supervision to CGA for balance, decreased overall gait speed. Ascend/descend 8 x 6" stairs with 2 handrails and CGA, step-to gait pattern. Pt reports feeling significantly fatigued following stair navigation. Sit to/from supine on flat mat table with Supervision and increased time. Supine hip adductor and piriformis stretch via modified "butterfly stretch" 3 x 60 sec each. Pt exhibits significant hip tightness L/R. Sidesteps L/R x 10 ft each direction with RW and CGA for balance. Pt fatigues very quickly during this task and reports feeling dizzy at one point, symptoms resolve with seated rest break. Pt requests to return to bed due to fatigue and requesting nursing change dressing to buttocks. Pt returned to bed at Supervision level, left supine in bed with needs in reach. Pt missed 15 min of scheduled therapy session due to fatigue.  Therapy Documentation Precautions:  Precautions Precautions: Fall Precaution Comments: drains, colostomy, rectal drainage Restrictions Weight Bearing Restrictions: No General: PT Amount of Missed Time (min): 15 Minutes PT  Missed Treatment Reason: Patient fatigue     Therapy/Group: Individual Therapy   Excell Seltzer, PT, DPT, CSRS  04/18/2022, 9:51 AM

## 2022-04-19 DIAGNOSIS — B379 Candidiasis, unspecified: Secondary | ICD-10-CM

## 2022-04-19 DIAGNOSIS — B9629 Other Escherichia coli [E. coli] as the cause of diseases classified elsewhere: Secondary | ICD-10-CM

## 2022-04-19 DIAGNOSIS — M25559 Pain in unspecified hip: Secondary | ICD-10-CM

## 2022-04-19 DIAGNOSIS — D72829 Elevated white blood cell count, unspecified: Secondary | ICD-10-CM

## 2022-04-19 MED ORDER — CALCIUM POLYCARBOPHIL 625 MG PO TABS
1250.0000 mg | ORAL_TABLET | Freq: Two times a day (BID) | ORAL | Status: DC
  Administered 2022-04-19 – 2022-04-28 (×9): 1250 mg via ORAL
  Filled 2022-04-19 (×16): qty 2

## 2022-04-19 MED ORDER — PIPERACILLIN-TAZOBACTAM 3.375 G IVPB
3.3750 g | Freq: Three times a day (TID) | INTRAVENOUS | Status: DC
  Filled 2022-04-19: qty 50

## 2022-04-19 MED ORDER — SODIUM CHLORIDE 0.9 % IV SOLN
500.0000 mg | Freq: Four times a day (QID) | INTRAVENOUS | Status: DC
  Administered 2022-04-19 – 2022-04-28 (×34): 500 mg via INTRAVENOUS
  Filled 2022-04-19 (×38): qty 10

## 2022-04-19 NOTE — Progress Notes (Signed)
Physical Therapy Session Note  Patient Details  Name: Tyler [name] MRN: [id number] Date of Birth: 10-Feb-1951  Today's Date: 04/19/2022 PT Individual Time: 1115-1157 PT Individual Time Calculation (min): 42 min   Short Term Goals: Week 2:  PT Short Term Goal 1 (Week 2): = LTGS due to LOS PT Short Term Goal 2 (Week 2): =LTGs due to LOS PT Short Term Goal 3 (Week 2): =LTGs due to LOS PT Short Term Goal 4 (Week 2): =LTGs due to LOS  Skilled Therapeutic Interventions/Progress Updates:    Pt's partner present in room and engaged in d/c planning discussion in regards to equipment, home set-up/modifications (asking about grab bar in bathroom), endurance/activity tolerance, and overall follow up therapies. Plan to order a RW and no w/c needed at this time. Discussed options for community (MD appts, stores, etc) and pt and partner in agreement. Also discussed family education and coming to therapy sessions at some point this week to prepare for d/c and partner in agreement and eager - he states he plans to check the patient's schedule and come in. He was unable to stay today's session after our discussion due to other engagements.   Pt donned BOTH shoes today from bed level with figure 4 technique with supervision! Pt engaged in bed mobility overall supervision and use of bed rails for support. Functional gait on unit with RW > 150' with close supervision to occasional CGA due to fatigue and slight decreased balance as fatigued. Instructed in simulated car transfer training to sedan height with overall CGA and extra time for management of BLE but able to perform. Cues for improved technique and options with adjustments in the car. Gait with RW over ramp, mulched surface, and curb step negotiation to simulate community mobility with overall CGA and cues again for technique with curb management for safety. Attempted to work on higher level balance activity but limited due to fatigue and "weakness". Pt was  able to stand on airex tab without UE support about 30 sec and then with eyes closed about 10-15 sec without UE support before requesting seated break. Pt then able to walk back to room with RW with close supervision after rest break and returned to bed with supervision and all needs in reach.   Therapy Documentation Precautions:  Precautions Precautions: Fall Precaution Comments: drains, colostomy, rectal drainage Restrictions Weight Bearing Restrictions: No    Pain: Pain Assessment Pain Scale: 0-10 Pain Score: 0-No pain     Therapy/Group: Individual Therapy  Tyler [name], PT, DPT, CBIS  04/19/2022, 12:16 PM

## 2022-04-19 NOTE — Progress Notes (Signed)
Pharmacy Antibiotic Note  Tyler [name] is a 71 y.o. male admitted on 04/09/2022 with intra-abdominal infection.  Pharmacy has been consulted for Primaxin dosing  Plan: - Start Primaxin 500 mg IV every 6 hours - Will continue to follow renal function, culture results, LOT, and antibiotic de-escalation plans   Height: '5\' 9"'$  (175.3 cm) Weight: 75.1 kg (165 lb 9.1 oz) IBW/kg (Calculated) : 70.7  Temp (24hrs), Avg:99.5 F (37.5 C), Min:99 F (37.2 C), Max:100 F (37.8 C)  Recent Labs  Lab 04/13/22 0420 04/15/22 0936 04/16/22 0434 04/18/22 0925  WBC  --  9.4  --  15.0*  CREATININE 1.00  --  0.93  --     Estimated Creatinine Clearance: 75 mL/min (by C-G formula based on SCr of 0.93 mg/dL).    Allergies  Allergen Reactions   Fluconazole Hives, Rash and Other (See Comments)    Shingles activated    Griseofulvin Anaphylaxis, Swelling, Rash and Other (See Comments)    Throat swelling     Sulfa Antibiotics Other (See Comments)    Joints ache, swell and caused inflammation    Oxycodone Nausea Only and Other (See Comments)    Nauseous to the point of almost vomiting   Mometasone Furo-Formoterol Fum Other (See Comments)    Lack of therapeutic effect    Retapamulin Rash    Thank you for allowing pharmacy to be a part of this patient's care.  Alycia Rossetti, PharmD, BCPS Infectious Diseases Clinical Pharmacist 04/19/2022 4:02 PM   **Pharmacist phone directory can now be found on [email or web address] ([email or web address]).  Listed under [place].

## 2022-04-19 NOTE — Progress Notes (Signed)
PROGRESS NOTE   Subjective/Complaints: Asked about CT can completed yesterday, ID changing abx to imipenem. He reports his stool is a little soft since fiber pill decreased from 3 tabs to 1 tab.    Review of Systems  Review of Systems  Respiratory:  Negative for shortness of breath.   Cardiovascular:  Negative for chest pain and palpitations.  Gastrointestinal:  Negative for abdominal pain, nausea and vomiting.  Neurological:  Positive for weakness.    Objective:   CT ABDOMEN PELVIS W CONTRAST  Result Date: 04/18/2022 CLINICAL DATA:  Follow-up intra-abdominal abscess. EXAM: CT ABDOMEN AND PELVIS WITH CONTRAST TECHNIQUE: Multidetector CT imaging of the abdomen and pelvis was performed using the standard protocol following bolus administration of intravenous contrast. RADIATION DOSE REDUCTION: This exam was performed according to the departmental dose-optimization program which includes automated exposure control, adjustment of the mA and/or kV according to patient size and/or use of iterative reconstruction technique. CONTRAST:  178m OMNIPAQUE IOHEXOL 300 MG/ML  SOLN COMPARISON:  Most recent CT 04/10/2022 FINDINGS: Lower chest: Decreased size of left pleural effusion with only trace residual. Associated atelectasis. Hepatobiliary: Punctate hepatic granuloma. No suspicious liver lesion. Gallbladder physiologically distended, no calcified stone. No biliary dilatation. Pancreas: No ductal dilatation or inflammation. Spleen: Mildly enlarged spanning 14.2 cm cranial caudal. No focal splenic abnormality. Small amount of perisplenic fluid adjacent to the inferior pole. Adrenals/Urinary Tract: No adrenal nodule. Previous anterior perirenal fluid collection no abuts the upper pole of the left kidney. There is no hydronephrosis. Small focus of air in the urinary bladder is likely related to prior instrumentation. No bladder wall thickening.  Stomach/Bowel: Decompressed stomach. Few prominent fluid-filled loops of small bowel in the upper abdomen, no obstruction. Enteric contrast reaches the colon. Left lower quadrant colostomy. Stapled off sigmoid colon. Vascular/Lymphatic: Aortic atherosclerosis. Patent portal, splenic, and superior mesenteric veins. No bulky abdominopelvic adenopathy. Reproductive: Unremarkable. Other: Drainage catheter in the pelvis is looped within a presacral air-fluid fluid collection. This measures approximately 8.4 x 3.4 cm, series 3, image 76, previously 7.9 x 2.5 cm. There is an increased fluid component compared to prior exam. U shaped collections emanating anteriorly from this dominant presacral collection persist, coursing lateral to the bladder, series 3, image 73. These may encircle the bladder and communicate anteriorly, uncertain. These collections measure approximately 1 cm in thickness. The left retroperitoneal collection has slightly diminished, currently 3.1 x 1.4 cm, series 3, image 58, previously 4.7 x 1.6 cm. Left anterior perirenal collection persists, diminished in size currently 2.3 x 1.6 cm, series 3, image 36, previously 3.2 x 2.3 cm. The previous potential connection between these 2 fluid collections has diminished. Surgical drain located within the left anterior abdominal wall musculature has no associated fluid collection. Musculoskeletal: Intramuscular collection within the left obturator musculature measures 3 x 1.6 cm, series 3, image 87, previously 3.2 x 1.8 cm. Stable osseous structures. Stable right thigh intramuscular lipoma IMPRESSION: 1. Drainage catheter in the pelvis is looped within a presacral air-fluid collection. This measures approximately 8.4 x 3.4 cm, previously 7.9 x 2.5 cm. There is an increased fluid component compared to prior exam. U shaped collections emanating anteriorly from this dominant presacral collection  coursing lateral to the bladder, 1 cm in thickness. These may encircle  the bladder and communicate anteriorly, uncertain. 2. The left retroperitoneal collections have slightly diminished in size, lower quadrant collection currently measures 3.1 x 1.4 cm, anterior pararenal collection 2.3 x 1.6 cm. The previous potential connection between these 2 fluid collections has diminished. 3. Intramuscular collection within the left obturator musculature measures 3 x 1.6 cm, previously 3.2 x 1.8 cm. 4. Decreased size of left pleural effusion with only trace residual. 5. Mild splenomegaly. Aortic Atherosclerosis (ICD10-I70.0). Electronically Signed   By: Keith Rake M.D.   On: 04/18/2022 20:57   CT FEMUR LEFT W CONTRAST  Result Date: 04/18/2022 CLINICAL DATA:  Soft tissue infection suspected, thigh, xray done EXAM: CT OF THE LOWER LEFT EXTREMITY WITH CONTRAST TECHNIQUE: Multidetector CT imaging of the lower left extremity was performed according to the standard protocol following intravenous contrast administration. RADIATION DOSE REDUCTION: This exam was performed according to the departmental dose-optimization program which includes automated exposure control, adjustment of the mA and/or kV according to patient size and/or use of iterative reconstruction technique. CONTRAST:  160m OMNIPAQUE IOHEXOL 300 MG/ML  SOLN COMPARISON:  Limited MRI 03/29/2022, included portions from prior abdominopelvic CTs. FINDINGS: Bones/Joint/Cartilage No fracture. No periosteal reaction, bone destruction, or erosions. Hip and knee alignment are maintained. Ligaments Suboptimally assessed by CT. Muscles and Tendons Left obturator fluid collection measures approximately 3.1 x 1.4 cm slightly diminished from prior abdominal CT 04/10/2022. There are multiple thin elongated fluid collections within left abductor muscle. Proximally this is seen on series 1, image 42 and series 6, image 25, spanning approximately 5.5 cm in length, subcentimeter in thickness. In the midportion this is seen on image 161 and series  6, image 27, spanning 4.6 cm in length. Distal collection measures 1.6 x 1.1 x 4.6 cm, series 1, image 74 and series 6, image 24, this collection is peripherally enhancing and thick walled. Soft tissues Mild soft tissue edema.  No subcutaneous collection. IMPRESSION: 1. Multiple thin elongated fluid collections within the left abductor muscle, suspicious for abscesses. The largest of these measures 1.6 x 1.1 x 4.6 cm distally, thick-walled and peripherally enhancing. 2. Left obturator fluid collection measures 3.1 x 1.4 cm, slightly diminished from prior abdominal CT 04/10/2022. Electronically Signed   By: MKeith RakeM.D.   On: 04/18/2022 21:03   Recent Labs    04/18/22 0925  WBC 15.0*  HGB 8.2*  HCT 25.2*  PLT 347    No results for input(s): NA, K, CL, CO2, GLUCOSE, BUN, CREATININE, CALCIUM in the last 72 hours.   Intake/Output Summary (Last 24 hours) at 04/19/2022 1157 Last data filed at 04/19/2022 0857 Gross per 24 hour  Intake 1285 ml  Output 1795 ml  Net -510 ml      Pressure Injury 04/09/22 Heel Left Deep Tissue Pressure Injury - Purple or maroon localized area of discolored intact skin or blood-filled blister due to damage of underlying soft tissue from pressure and/or shear. (Active)  04/09/22 1315  Location: Heel  Location Orientation: Left  Staging: Deep Tissue Pressure Injury - Purple or maroon localized area of discolored intact skin or blood-filled blister due to damage of underlying soft tissue from pressure and/or shear.  Wound Description (Comments):   Present on Admission: Yes     Pressure Injury 04/09/22 Heel Right Deep Tissue Pressure Injury - Purple or maroon localized area of discolored intact skin or blood-filled blister due to damage of underlying soft tissue from pressure  and/or shear. (Active)  04/09/22 1315  Location: Heel  Location Orientation: Right  Staging: Deep Tissue Pressure Injury - Purple or maroon localized area of discolored intact skin or  blood-filled blister due to damage of underlying soft tissue from pressure and/or shear.  Wound Description (Comments):   Present on Admission: Yes    Physical Exam: Vital Signs Blood pressure 134/81, pulse 93, temperature 99 F (37.2 C), resp. rate 18, height '5\' 9"'$  (1.753 m), weight 75.1 kg, SpO2 95 %.  General: Alert and oriented x3. NAD, in bed HEENT: Head, normocephalic, atraumatic, PERRLA, EOMI, sclera aniecteric, oral mucosa pink and moist Neck: supple Heart: Mild tachycardia , no rubs, extra heart sounds Chest: CTA bilaterally, no increased work of breathing Abdomen: soft, positive bowel sounds, minimal tenderness,  Colostomy viable with stool present. Comments: Pelvis has JP drains x2 with small amount of drainage (yellow-white).  Skin: slightly erythematous, skin intact at former drain site. Stoma around colostomy intact.  Heel protectors on both heels.  There is a small amount of redness on the lateral aspect of the left heel and medial aspect of the right heel. Buttocks have left wound covered with dressing. Extremities: No clubbing, edema, or cyanosis. +2 pulses. Neuro: Alert and oriented x3, CN II-XII intact, no focal deficits. Musculoskeletal: FROM all 4 extremities.   Strength: BUE/BLE 4+/5 overall.  Sensory: LT intact in all 4 extremities. Psych: anxious  Assessment/Plan: 1. Functional deficits which require 3+ hours per day of interdisciplinary therapy in a comprehensive inpatient rehab setting. Physiatrist is providing close team supervision and 24 hour management of active medical problems listed below. Physiatrist and rehab team continue to assess barriers to discharge/monitor patient progress toward functional and medical goals  Care Tool:  Bathing    Body parts bathed by patient: Right arm, Left arm, Chest, Abdomen, Face, Front perineal area, Right upper leg, Left upper leg, Right lower leg, Left lower leg, Buttocks   Body parts bathed by helper: Buttocks,  Right lower leg, Left lower leg     Bathing assist Assist Level: Supervision/Verbal cueing     Upper Body Dressing/Undressing Upper body dressing   What is the patient wearing?: Pull over shirt    Upper body assist Assist Level: Independent    Lower Body Dressing/Undressing Lower body dressing      What is the patient wearing?: Incontinence brief     Lower body assist Assist for lower body dressing: Supervision/Verbal cueing     Toileting Toileting    Toileting assist Assist for toileting: Minimal Assistance - Patient > 75% (urinal use- has colostomy with total A for care)     Transfers Chair/bed transfer  Transfers assist     Chair/bed transfer assist level: Contact Guard/Touching assist     Locomotion Ambulation   Ambulation assist      Assist level: Contact Guard/Touching assist Assistive device: Walker-rolling Max distance: 150'   Walk 10 feet activity   Assist     Assist level: Contact Guard/Touching assist Assistive device: Walker-rolling   Walk 50 feet activity   Assist Walk 50 feet with 2 turns activity did not occur: Safety/medical concerns  Assist level: Contact Guard/Touching assist Assistive device: Walker-rolling    Walk 150 feet activity   Assist Walk 150 feet activity did not occur: Safety/medical concerns  Assist level: Contact Guard/Touching assist Assistive device: Walker-rolling    Walk 10 feet on uneven surface  activity   Assist Walk 10 feet on uneven surfaces activity did not occur: Safety/medical concerns  Wheelchair     Assist Is the patient using a wheelchair?: Yes Type of Wheelchair: Manual    Wheelchair assist level: Supervision/Verbal cueing Max wheelchair distance: 150    Wheelchair 50 feet with 2 turns activity    Assist        Assist Level: Supervision/Verbal cueing   Wheelchair 150 feet activity     Assist      Assist Level: Supervision/Verbal cueing   Blood  pressure 134/81, pulse 93, temperature 99 F (37.2 C), resp. rate 18, height '5\' 9"'$  (1.753 m), weight 75.1 kg, SpO2 95 %.   Medical Problem List and Plan: 1. Functional deficits secondary to debility secondary to pelvic abscess.  Status post laparoscopy, abdominal washout drain placement with creation of diverting colostomy 03/20/2022             -patient may shower but incisions/drains should be covered             -ELOS/Goals: 10-14 days modI             -Continue PT/OT             -Able to don shorts without assistance              -Expected discharge 04/23/22 2.  Antithrombotics: -DVT/anticoagulation:  Pharmaceutical: Lovenox.  Venous Doppler negative             -antiplatelet therapy: N/A 5/27 Continue Lovenox 3. Pain Management: Robaxin 1000 mg 3 times daily, tramadol as needed 5/31- Pain continues to be controlled, continue PRN tramadol 6/1 Discussed use of pain meds when needed to stay ahead of pain for therapy 6/5 Not using PRN pain medications, he reports pain is well controlled 4. Mood: Ativan as needed as well as trazodone             -antipsychotic agents: N/A 5. Neuropsych: This patient is capable of making decisions on his own behalf. 6. Skin/Wound Care: Routine skin checks with colostomy education 7. Fluids/Electrolytes/Nutrition: Routine in and outs with follow-up chemistries 5/27 Slightly low sodium (133) and potassium (3.4), continue to trend, consider repleting 5/30 Na up to 135 and K wnl after repletion 6/4 No issues with electrolytes today    Latest Ref Rng & Units 04/16/2022    4:34 AM 04/13/2022    4:20 AM 04/12/2022    6:44 AM  BMP  Glucose 70 - 99 mg/dL  105   98    BUN 8 - 23 mg/dL  10   8    Creatinine 0.61 - 1.24 mg/dL 0.93   1.00   1.00    Sodium 135 - 145 mmol/L  135   132    Potassium 3.5 - 5.1 mmol/L  4.0   3.4    Chloride 98 - 111 mmol/L  103   99    CO2 22 - 32 mmol/L  26   25    Calcium 8.9 - 10.3 mg/dL  8.4   8.2      8.  ID/intra-abdominal  infection.  Continue micafungin 100 mg daily as well as Zosyn 3.375 g every 8 hours.  Plan is for 6 weeks starting date 5/16 ending 05/11/2022 and will need repeat CT abdomen/pelvis on left thigh in 1 to 2 weeks -5/27 Pt has event last night on 5/26 of temp 101.6, HR 115, new onset cough, with shortness of breath.  Per night nurse on 5/26-5/27 became diaphoretic around 0000 on 5/27 and drenched the bed with sweat, with no subsequent  fevers or episodes of tachycardia. Orders were placed for CBC, BMET, blood cultures x2, CXR, COVID, respiratory panel, and repeat CT abd/pelvis. ID was consulted the evening of 5/26 with recommendation given to continue current regimen of 6 week Zosyn and micafungin based on original plan.  Per [place], CT abdomen/pelvis indicated with febrile event, especially in light that patient's last CT scan was 04/05/22 and he inadvertently lost one of his JP drains on the morning of 04/06/22. -Respiratory panel and COVID negative.  CXR shows Small left pleural effusion with atelectasis at the left lung base. -CT abd/pelvis: demonstrates a complex left retroperitoneal fluid collection, with superior and inferior components that appear to communicate overlying the Gerota's fascia and left psoas musculature. ID to round on pt on 04/11/22 and will decide next steps. 04/11/22 See ID and Radiology note.  IR was consulted for intra-abdominal fluid collection. Dr. Denna Haggard (IR) has approved patient for aspiration of inferior portion of fluid collection. -Per Radiology note, PA will reach out to General Surgery (Dr. Marcello Moores) to notify of plan. Also, called Dr. Rosendo Gros, MD on-call for General Surgery this weekend to update office on patient since surgery signed off on 04/09/22.  I was unable to reach provider since he was in-procedure.  But relayed message by telephone to nurse, Karena Addison, [place] of patient's febrile episode 04/09/22-04/10/22 and plans for abdominal fluid removal and possible drain placement by  IR. 5/29 IR completed aspiration of 81m of purulent material, no drain placed 5/29 ID continuing Zosyn, micafungin 5/31 culture of L flank abscess with GNR and gram pos cocci 6/1 ID changing zosyn to Ceftriaxone and Augmentin, continuing Micafungin, continue to follow 6/2 no fevers overnight, follow 6/5 new fluid collections, ID changed to imipenem, general surgery consult 9.  Postoperative anemia.  Continue iron supplement.  Follow-up CBC 5/28 Hgb 8.2 low but stable, continue to trend and monitor. 5/29 Hgb stable at 8.0, follow 6/3 Hgb 8.1, stable    Latest Ref Rng & Units 04/18/2022    9:25 AM 04/15/2022    9:36 AM 04/12/2022    6:44 AM  CBC  WBC 4.0 - 10.5 K/uL 15.0   9.4   6.3    Hemoglobin 13.0 - 17.0 g/dL 8.2   8.1   8.0    Hematocrit 39.0 - 52.0 % 25.2   24.3   23.8    Platelets 150 - 400 K/uL 347   304   310     10.  Hypertension.  Continue Zebeta 10 mg nightly,   Monitor with increased mobility 5/20 BP well controlled, continue to monitor, continue zebeta 6/1 Well controlled, follow 6/5 BP well controlled, follow    04/19/2022    5:13 AM 04/18/2022    7:36 PM 04/18/2022    1:24 PM  Vitals with BMI  Systolic [id number] Diastolic 81 86 85  Pulse 93 100 107   11.  Decreased nutritional storage.  Dietary follow-up. Continue Prosource.  12.  Urinary retention.  Foley tube removed and urinating.  Check PVR 5/27 Pt is voiding okay, reported that he had some urinary incontinence for the first day or two immediately following Foley removal, which has since resolved. 5/31 reports no difficulty voiding, follow  13. Hypokalemia             -5/29KCL today, repeat labs             -5/30 K+ wnl now  6/3 K+, remains wnl's, 4.0, on 5/30 14. Hypoalbuminemia             -Continue ensure supplementation             -Discussed healthy diet, He ate all his breakfast today 5/30 15. Wound at left lower buttocks site of previous drain             -5/20 WOC nurse consult recommends  aquacel Ag with dry gauze and silicone foam. Continue to follow 6/3 Patient reports increased volume of greenish discharge from left buttock wound saturating dressing.  Wound bed appears pinkish red length 3 cm, width 1.5 cm, depth not measured per patient request but appears about 1.5-2 cm.  WBC's normal, afebrile.  Called ID to make awake, spoke with on-call MD Laurice Record, MD.  Michela Pitcher that ID will round and follow-up on 04/18/22. 6/4 Wound site still is producing greenish drainage, ID was notified yesterday and again today with WBC spike of 15.0. ID to round on and follow up on patient. -6/5 CT abdomen/pelv and left femur completed.  May need additional aspiration/drain. ID has seen him and recommended surgical eval. I called surgery christopher white for eval, appreciate assistance 16. Asthma,              -Using albuterol inhaler, monitor, improved today             -6/1 Reports breathing continues to be improved              6/4 No reported issues today. 17. Leukocytosis  -6/5 ID adjusted abx, continue to follow  LOS: 10 days A FACE TO [place] 04/19/2022, 11:57 AM

## 2022-04-19 NOTE — Progress Notes (Signed)
Contacted by RN re patient's concerns re: procedure tomorrow and NPO status. Reached out to Dr. Marciano Sequin who relayed that he noted ID recommendations after office and reached out to surgeon who plans on rounding and discussion options but to keep patient NPO just in case intervention needed.  Patient had been  informed by someone that surgeon and an orthopedist was going to see him but he was concerned that no one had followed up.  I relayed Dr. Buelah Manis input to patient and that any procedure would need to be discussed with him prior to signing consent. Updated partner with information available.

## 2022-04-19 NOTE — Progress Notes (Signed)
Occupational Therapy Session Note  Patient Details  Name: Tyler [name] MRN: [id number] Date of Birth: 12-Sep-1951  Today's Date: 04/19/2022 OT Individual Time: 8864-8472 OT Individual Time Calculation (min): 45 min    Short Term Goals: Week 2:  OT Short Term Goal 1 (Week 2): STGs = LTGs  Skilled Therapeutic Interventions/Progress Updates:    Pt received in bed ready for therapy.  Pt transferred [place], transferred with RW to w/c all with Supervision.   He can now don shorts over B feet without A.  He also can don his R sock and shoe by crossing leg over L knee. His L leg was hurting and unable to cross.    Pt stood for 4 minutes to brush teeth at sink.   Donned new shirt once IV disconnected.   Worked on LLE AROM with stretches using gait belt as a leg lifter for full knee extension, internal rotation (to start crossing leg). Using belt as a loop, he worked on B shoulder strength with resisted sh abd and sh flexion.  Pt resting in wc with all needs met.   Therapy Documentation Precautions:  Precautions Precautions: Fall Precaution Comments: drains, colostomy, rectal drainage Restrictions Weight Bearing Restrictions: No    Pain: c/o L leg pain, MD aware   ADL: ADL Eating: Independent Where Assessed-Eating: Wheelchair Grooming: Independent Where Assessed-Grooming: Wheelchair Upper Body Bathing: Setup Where Assessed-Upper Body Bathing: Wheelchair Lower Body Bathing: Supervision/safety Where Assessed-Lower Body Bathing: Wheelchair Upper Body Dressing: Independent Where Assessed-Upper Body Dressing: Wheelchair Lower Body Dressing: Contact guard Where Assessed-Lower Body Dressing: Wheelchair Toileting: Moderate assistance Where Assessed-Toileting: Toilet (with empty ostomy bag at toilet) Toilet Transfer: Close supervision Toilet Transfer Method: Counselling psychologist: Energy manager: Not assessed Social research officer, government: Not assessed  (will assess next session due to pt fearing lightheadedness) ADL Comments: pt uses sock aid and reacher to don clothing over LLE  Therapy/Group: Individual Therapy  [place] 04/19/2022, 9:58 AM

## 2022-04-19 NOTE — Progress Notes (Signed)
[facility]ious [facility]  Date of Admission:  04/09/2022     Total days of antibiotics 41         ASSESSMENT:  Mr. Porche continues to have increased drainage from his left buttock wound. CT abdomen/pelvis concerning for worsening presacral air-fluid collection, left retroperitoneal collection and intramuscular collection within the left obturator musculature. In addition CT left femur with multiple fluid collections concerning for abscess. The findings support the clinical signs of increased drainage through history and observed on exam. Recommend re-evaluation by General Surgery and possibly Orthopedics/IR for abductor and obturator fluid collections as it appears we do not have adequate source control. Continue current dose of Micafungin and change  Augmentin and Ceftriaxone to piperacillin-tazobactam. Therapeutic monitoring of renal function and LFTs. Wound care per [facility] RN and surgery recommendations. Remaining medical and supportive care per primary team.    PLAN:  Continue current dose of micafungin Change antibiotic to piperacillin-tazobactam.  Recommend General Surgery and Orthopedic/IR evaluation for abscesses Wound Care per General Surgery and [facility] RN recommendations. Remaining medical and supportive care per primary team.   Principal Problem:   Abscess, intra-abdominal, postoperative Active Problems:   Debility   Sepsis due to Enterococcus ([place])   E coli infection   Candida albicans infection    vitamin C  500 mg Oral BID   bisoprolol  10 mg Oral QHS   Chlorhexidine Gluconate Cloth  6 each Topical Daily   enoxaparin (LOVENOX) injection  40 mg Subcutaneous Q24H   famotidine  20 mg Oral BID   feeding supplement  237 mL Oral TID BM   ferrous sulfate  325 mg Oral BID WC   lip balm  1 application. Topical BID   loperamide  2 mg Oral TID with meals   methocarbamol  1,000 mg Oral TID   montelukast  10 mg Oral QHS   multivitamin with minerals  1 tablet  Oral Daily   pantoprazole  40 mg Oral Daily   polycarbophil  1,250 mg Oral BID   sodium chloride flush  10-40 mL Intracatheter Q12H    SUBJECTIVE:  Afebrile overnight with increased leukocytosis to 15. Having increased drainage from his JP drains as well as his gluteal wound.   Allergies  Allergen Reactions   Fluconazole Hives, Rash and Other (See Comments)    Shingles activated    Griseofulvin Anaphylaxis, Swelling, Rash and Other (See Comments)    Throat swelling     Sulfa Antibiotics Other (See Comments)    Joints ache, swell and caused inflammation    Oxycodone Nausea Only and Other (See Comments)    Nauseous to the point of almost vomiting   Mometasone Furo-Formoterol Fum Other (See Comments)    Lack of therapeutic effect    Retapamulin Rash     Review of Systems: Review of Systems  Constitutional:  Negative for chills, fever and weight loss.  Respiratory:  Negative for cough, shortness of breath and wheezing.   Cardiovascular:  Negative for chest pain and leg swelling.  Gastrointestinal:  Negative for abdominal pain, constipation, diarrhea, nausea and vomiting.  Skin:  Negative for rash.     OBJECTIVE: Vitals:   04/18/22 1324 04/18/22 1936 04/19/22 0513 04/19/22 1506  BP: 124/85 138/86 134/81 124/78  Pulse: (!) 107 100 93 95  Resp: '20 18 18 18  '$ Temp: 99.3 F (37.4 C) 100 F (37.8 C) 99 F (37.2 C) 99.5 F (37.5 C)  TempSrc: Oral   Oral  SpO2: 100%  98% 95% 100%  Weight:      Height:       Body mass index is 24.45 kg/m.  Physical Exam Constitutional:      General: He is not in acute distress.    Appearance: He is well-developed.  Cardiovascular:     Rate and Rhythm: Regular rhythm. Tachycardia present.     Heart sounds: Normal heart sounds.  Pulmonary:     Effort: Pulmonary effort is normal.     Breath sounds: Normal breath sounds.  Abdominal:     General: There is no distension.     Palpations: There is no mass.     Tenderness: There is no  abdominal tenderness.     Comments: JP drains functional and patent with purulent drainage. Ostomy present and dressing in place.   Genitourinary:    Comments: Rectal wound with purulent drainage. No induration.  Skin:    General: Skin is warm and dry.  Neurological:     Mental Status: He is alert and oriented to person, place, and time.  Psychiatric:        Mood and Affect: Mood normal.    Lab Results Lab Results  Component Value Date   WBC 15.0 (H) 04/18/2022   HGB 8.2 (L) 04/18/2022   HCT 25.2 (L) 04/18/2022   MCV 92.0 04/18/2022   PLT 347 04/18/2022    Lab Results  Component Value Date   CREATININE 0.93 04/16/2022   BUN 10 04/13/2022   NA 135 04/13/2022   K 4.0 04/13/2022   CL 103 04/13/2022   CO2 26 04/13/2022    Lab Results  Component Value Date   ALT 36 04/13/2022   AST 28 04/13/2022   ALKPHOS 287 (H) 04/13/2022   BILITOT 0.5 04/13/2022     Microbiology: Recent Results (from the past 240 hour(s))  Respiratory (~20 pathogens) panel by PCR     Status: None   Collection Time: 04/09/22 11:00 PM   Specimen: Nasopharyngeal Swab; Respiratory  Result Value Ref Range Status   Adenovirus NOT DETECTED NOT DETECTED Final   Coronavirus 229E NOT DETECTED NOT DETECTED Final    Comment: (NOTE) The Coronavirus on the Respiratory Panel, DOES NOT test for the novel  Coronavirus (2019 nCoV)    Coronavirus HKU1 NOT DETECTED NOT DETECTED Final   Coronavirus NL63 NOT DETECTED NOT DETECTED Final   Coronavirus OC43 NOT DETECTED NOT DETECTED Final   Metapneumovirus NOT DETECTED NOT DETECTED Final   Rhinovirus / Enterovirus NOT DETECTED NOT DETECTED Final   Influenza A NOT DETECTED NOT DETECTED Final   Influenza B NOT DETECTED NOT DETECTED Final   Parainfluenza Virus 1 NOT DETECTED NOT DETECTED Final   Parainfluenza Virus 2 NOT DETECTED NOT DETECTED Final   Parainfluenza Virus 3 NOT DETECTED NOT DETECTED Final   Parainfluenza Virus 4 NOT DETECTED NOT DETECTED Final    Respiratory Syncytial Virus NOT DETECTED NOT DETECTED Final   Bordetella pertussis NOT DETECTED NOT DETECTED Final   Bordetella Parapertussis NOT DETECTED NOT DETECTED Final   Chlamydophila pneumoniae NOT DETECTED NOT DETECTED Final   Mycoplasma pneumoniae NOT DETECTED NOT DETECTED Final    Comment: Performed at [facility], [address] [place], [place] [address]  SARS Coronavirus 2 by RT PCR (hospital order, performed in [facility] hospital lab) *cepheid single result test*     Status: None   Collection Time: 04/09/22 11:00 PM  Result Value Ref Range Status   SARS Coronavirus 2 by RT PCR NEGATIVE  NEGATIVE Final    Comment: (NOTE) SARS-CoV-2 target nucleic acids are NOT DETECTED.  The SARS-CoV-2 RNA is generally detectable in upper and lower respiratory specimens during the acute phase of infection. The lowest concentration of SARS-CoV-2 viral copies this assay can detect is 250 copies / mL. A negative result does not preclude SARS-CoV-2 infection and should not be used as the sole basis for treatment or other patient management decisions.  A negative result may occur with improper specimen collection / handling, submission of specimen other than nasopharyngeal swab, presence of viral mutation(s) within the areas targeted by this assay, and inadequate number of viral copies (<250 copies / mL). A negative result must be combined with clinical observations, patient history, and epidemiological information.  Fact Sheet for Patients:   https://www.patel.info/  Fact Sheet for Healthcare Providers: https://hall.com/  This test is not yet approved or  cleared by the [place] FDA and has been authorized for detection and/or diagnosis of SARS-CoV-2 by FDA under an Emergency Use Authorization (EUA).  This EUA will remain in effect (meaning this test can be used) for the duration of the COVID-19 declaration under Section 564(b)(1) of  the Act, 21 U.S.C. section 360bbb-3(b)(1), unless the authorization is terminated or revoked sooner.  Performed at [facility], [place] [address], [place], [place] [address]   Culture, blood (Routine X 2) w Reflex to ID Panel     Status: None   Collection Time: 04/10/22  2:50 AM   Specimen: BLOOD LEFT FOREARM  Result Value Ref Range Status   Specimen Description BLOOD LEFT FOREARM  Final   Special Requests   Final    BOTTLES DRAWN AEROBIC AND ANAEROBIC Blood Culture results may not be optimal due to an excessive volume of blood received in culture bottles   Culture   Final    NO GROWTH 5 DAYS Performed at [facility], [place] [address], [place], [place] [address]    Report Status 04/15/2022 FINAL  Final  Culture, blood (Routine X 2) w Reflex to ID Panel     Status: None   Collection Time: 04/10/22  2:50 AM   Specimen: BLOOD LEFT HAND  Result Value Ref Range Status   Specimen Description BLOOD LEFT HAND  Final   Special Requests   Final    BOTTLES DRAWN AEROBIC AND ANAEROBIC Blood Culture results may not be optimal due to an excessive volume of blood received in culture bottles   Culture   Final    NO GROWTH 5 DAYS Performed at [facility], [place] [address] [place], [place] [address]    Report Status 04/15/2022 FINAL  Final  Aerobic/Anaerobic Culture w Gram Stain (surgical/deep wound)     Status: None   Collection Time: 04/12/22 10:12 AM   Specimen: Abscess  Result Value Ref Range Status   Specimen Description ABSCESS  Final   Special Requests ABSC LEFT FLANK  Final   Gram Stain   Final    ABUNDANT WBC PRESENT, PREDOMINANTLY PMN RARE GRAM NEGATIVE RODS RARE GRAM POSITIVE COCCI    Culture   Final    MODERATE ESCHERICHIA COLI NO ANAEROBES ISOLATED Performed at [facility], [place] [address], [place], [place] [address]    Report Status 04/17/2022 FINAL  Final   Organism ID, Bacteria ESCHERICHIA COLI  Final      Susceptibility   Escherichia coli - MIC*     AMPICILLIN >=32 RESISTANT Resistant     CEFAZOLIN >=64  RESISTANT Resistant     CEFEPIME 0.25 SENSITIVE Sensitive     CEFTAZIDIME <=1 SENSITIVE Sensitive     CEFTRIAXONE <=0.25 SENSITIVE Sensitive     CIPROFLOXACIN <=0.25 SENSITIVE Sensitive     GENTAMICIN <=1 SENSITIVE Sensitive     IMIPENEM <=0.25 SENSITIVE Sensitive     TRIMETH/SULFA <=20 SENSITIVE Sensitive     AMPICILLIN/SULBACTAM >=32 RESISTANT Resistant     PIP/TAZO >=128 RESISTANT Resistant     * MODERATE ESCHERICHIA COLI     Terri Piedra, NP [facility]  04/19/2022  3:22 PM

## 2022-04-19 NOTE — Progress Notes (Signed)
Occupational Therapy Session Note  Patient Details  Name: Tyler [name] MRN: [id number] Date of Birth: 02-13-1951  Today's Date: 04/19/2022 OT Individual Time: 9211-9417 OT Individual Time Calculation (min): 73 min    Short Term Goals: Week 2:  OT Short Term Goal 1 (Week 2): STGs = LTGs  Skilled Therapeutic Interventions/Progress Updates:  Pt greeted supine in reporting fatigue and politely declining OOB session but  agreeable to OT intervention from bed level. Session focus on education related to energy conservation strategies for ADLS ( handout provided) and BUE strength and endurance for higher level functional mobility tasks. Pt completed below BUE therex with level 3 theraband:  X20 shoulder flexion  X20 bicep curls X20 shoulder horizontal ABD X20 shoulder diagonal pulls X20 shoulder extension  X20 alternating punches X20 bilateral shoulder external rotation   Issued pt written HEP to increase carryover  Pt completed 2 mins on zoom ball from bed level to increase UB strength/endurance for ADL participation.  Pt also completed below therex with 3 lb dowel rod: X20 bicep curls X20 forward rows X20 backwards rows X20 chest presses X20 OH presses X20 shoulder flexion  Pt left supine in bed with bed alarm activated and all needs within reach.   Therapy Documentation Precautions:  Precautions Precautions: Fall Precaution Comments: drains, colostomy, rectal drainage Restrictions Weight Bearing Restrictions: No  Pain: no pain reported during session     Therapy/Group: Individual Therapy  Precious Haws 04/19/2022, 3:35 PM

## 2022-04-19 NOTE — Progress Notes (Signed)
Physical Therapy Session Note  Patient Details  Name: Tyler [name] MRN: [id number] Date of Birth: 1951-07-25  Today's Date: 04/19/2022 PT Individual Time: 0931-0959 PT Individual Time Calculation (min): 28 min   Short Term Goals: Week 2:  PT Short Term Goal 1 (Week 2): = LTGS due to LOS PT Short Term Goal 2 (Week 2): =LTGs due to LOS PT Short Term Goal 3 (Week 2): =LTGs due to LOS PT Short Term Goal 4 (Week 2): =LTGs due to LOS  Skilled Therapeutic Interventions/Progress Updates:     Pt received seated in [facility] and agrees to therapy, reporting pain in buttocks. Number not provided. PT provides repositioning and rest breaks to manage pain. WC transport to gym for time management. Pt performs sit to stand to RW with CGA and cues for hand placement and body mechanics. Pt stands as PT discusses correct sizing of RW, but prior to ambulation pt sits down and says that he feels "too tired" and requests to return to room. PT provides gentle encouragement to participate and pt is fairly easily redirected and agreeable for continued therapy. Pt then stands and ambulates x175' with RW and CGA, with tactile cueing for posture and relaxing upper body for energy conservation. Following seated rest break, pt ambulates additional 175' with RW and same cueing. WC transport back to room. Stand step transfer to bed without AD and with CGA and cues for positioning. Pt left supine with all needs within reach.  Therapy Documentation Precautions:  Precautions Precautions: Fall Precaution Comments: drains, colostomy, rectal drainage Restrictions Weight Bearing Restrictions: No    Therapy/Group: Individual Therapy  Breck Coons, PT DPT 04/19/2022, 6:08 PM

## 2022-04-19 NOTE — Consult Note (Signed)
[facility] Nurse ostomy follow up Patient and partner present for teaching today. Patient performed pouch change in its entirety.  He needed my assistance to line up pouch as he was in bed.  He understands at home he will benefit from standing in front of a mirror.  Stoma type/location: LLQ colostomy Stomal assessment/size: 1 1/2" with indention from 7 to 12 o'clock.  Will stack 2 barrier rings to promote seal.   Peristomal assessment:  intact, indented.  Drain to RLQ flank.  Treatment options for stomal/peristomal skin: 1 1/2 barrier rings and convex pouch. Belt is not comfortable in bed so he is not wearing today. He understands rationale for belt and may start wearing again once home and more active.  He is wearing a brief in bed due to copious drainage from right gluteal wound.  He has had some scrotal irritation and itching and fungal powder has improved this.   ID was in to see wound and we will continue Aquacel to this area.  Output soft btown stool Ostomy pouching: 1pc./ convex  1 barrier ring around and an additional half from 7 to 12. Education provided: Patient performed pouch change with queing and minimal assistance .  He is knowledegable in ostomy care.  His partner is present and observes pouch change.  Enrolled patient in [place] Start Discharge program: Yes kit has arrived.   [facility] Nurse wound follow up Wound type: infectious to right gluteal.increased drainage and tenderness.  Measurement: 2 cm x 1 cm x 3 cm Wound [id number] pink Drainage (amount, consistency, odor) moderate purulence  no odor Periwound: moisture Dressing procedure/placement/frequency: Cleanse right gluteal wound with NS.  PAck a strip of aquacel into wound and cover with aquacel  and gauze.  Cover with silicone foam dressing . Change daily.  Will follow and see again Thursday.  Domenic Moras MSN, RN, FNP-BC [facility] Wound, Ostomy, Continence Nurse Pager [phone]

## 2022-04-20 ENCOUNTER — Inpatient Hospital Stay (HOSPITAL_COMMUNITY): Payer: BC Managed Care – PPO

## 2022-04-20 DIAGNOSIS — D649 Anemia, unspecified: Secondary | ICD-10-CM

## 2022-04-20 LAB — CBC WITH DIFFERENTIAL/PLATELET
Abs Immature Granulocytes: 0.41 10*3/uL — ABNORMAL HIGH (ref 0.00–0.07)
Basophils Absolute: 0.1 10*3/uL (ref 0.0–0.1)
Basophils Relative: 1 %
Eosinophils Absolute: 0.2 10*3/uL (ref 0.0–0.5)
Eosinophils Relative: 2 %
HCT: 23.5 % — ABNORMAL LOW (ref 39.0–52.0)
Hemoglobin: 7.5 g/dL — ABNORMAL LOW (ref 13.0–17.0)
Immature Granulocytes: 4 %
Lymphocytes Relative: 25 %
Lymphs Abs: 2.7 10*3/uL (ref 0.7–4.0)
MCH: 30 pg (ref 26.0–34.0)
MCHC: 31.9 g/dL (ref 30.0–36.0)
MCV: 94 fL (ref 80.0–100.0)
Monocytes Absolute: 0.7 10*3/uL (ref 0.1–1.0)
Monocytes Relative: 6 %
Neutro Abs: 6.8 10*3/uL (ref 1.7–7.7)
Neutrophils Relative %: 62 %
Platelets: 327 10*3/uL (ref 150–400)
RBC: 2.5 MIL/uL — ABNORMAL LOW (ref 4.22–5.81)
RDW: 14.8 % (ref 11.5–15.5)
WBC: 10.8 10*3/uL — ABNORMAL HIGH (ref 4.0–10.5)
nRBC: 0 % (ref 0.0–0.2)

## 2022-04-20 LAB — PROTIME-INR
INR: 1.2 (ref 0.8–1.2)
Prothrombin Time: 15.2 seconds (ref 11.4–15.2)

## 2022-04-20 MED ORDER — FENTANYL CITRATE (PF) 100 MCG/2ML IJ SOLN
INTRAMUSCULAR | Status: AC | PRN
  Administered 2022-04-20: 50 ug via INTRAVENOUS
  Administered 2022-04-20: 25 ug via INTRAVENOUS

## 2022-04-20 MED ORDER — MIDAZOLAM HCL 2 MG/2ML IJ SOLN
INTRAMUSCULAR | Status: AC
  Filled 2022-04-20: qty 4

## 2022-04-20 MED ORDER — MIDAZOLAM HCL 2 MG/2ML IJ SOLN
INTRAMUSCULAR | Status: AC | PRN
  Administered 2022-04-20: 1 mg via INTRAVENOUS
  Administered 2022-04-20: .5 mg via INTRAVENOUS

## 2022-04-20 MED ORDER — LIDOCAINE HCL 1 % IJ SOLN
INTRAMUSCULAR | Status: AC
  Filled 2022-04-20: qty 10

## 2022-04-20 MED ORDER — FENTANYL CITRATE (PF) 100 MCG/2ML IJ SOLN
INTRAMUSCULAR | Status: AC
  Filled 2022-04-20: qty 4

## 2022-04-20 NOTE — Progress Notes (Signed)
ex lap, ostomy creation and drain placement Subjective: Known to our service - followed by Dr. Marcello Moores whom is currently out this week  Asked to re-evaluate for presacral collection, slightly larger, in setting of leukocytosis  Currently looks much better than when I saw last >1 mo ago. Up in bed, in good spirits with partner at bedside. Reports 2 drains - output steadily decreasing but remains foul smelling, milky while in character. No n/v. Colostomy working well. No abdominal pain.   Objective: Vital signs in last 24 hours: Temp:  [97.8 F (36.6 C)-100 F (37.8 C)] 97.8 F (36.6 C) (06/06 0230) Pulse Rate:  [75-110] 75 (06/06 0230) Resp:  [17-18] 18 (06/06 0230) BP: (121-124)/(78-89) 123/89 (06/06 0230) SpO2:  [98 %-100 %] 98 % (06/06 0230)   Intake/Output from previous day: 06/05 0701 - 06/06 0700 In: 11 [P.O.:780] Out: 1520 [Urine:875; Drains:45; Stool:600] Intake/Output this shift: Total I/O In: -  Out: 150 [Urine:150]   General appearance: alert and cooperative GI: soft, non-distended JP drains with purulent fluid, milky while Colostomy productive with semi formed brown stool  **Exam completed with his primary RN Fabiola as chaperone\  Lab Results:  Recent Labs    04/18/22 0925 04/20/22 0436  WBC 15.0* 10.8*  HGB 8.2* 7.5*  HCT 25.2* 23.5*  PLT 347 327   BMET No results for input(s): NA, K, CL, CO2, GLUCOSE, BUN, CREATININE, CALCIUM in the last 72 hours.  PT/INR No results for input(s): LABPROT, INR in the last 72 hours.  ABG No results for input(s): PHART, HCO3 in the last 72 hours.  Invalid input(s): PCO2, PO2   MEDS, Scheduled  vitamin C  500 mg Oral BID   bisoprolol  10 mg Oral QHS   Chlorhexidine Gluconate Cloth  6 each Topical Daily   enoxaparin (LOVENOX) injection  40 mg Subcutaneous Q24H   famotidine  20 mg Oral BID   feeding supplement  237 mL Oral TID BM   ferrous sulfate  325 mg Oral BID WC   lip balm  1 application. Topical BID    loperamide  2 mg Oral TID with meals   methocarbamol  1,000 mg Oral TID   montelukast  10 mg Oral QHS   multivitamin with minerals  1 tablet Oral Daily   pantoprazole  40 mg Oral Daily   polycarbophil  1,250 mg Oral BID   sodium chloride flush  10-40 mL Intracatheter Q12H    Studies/Results: CT ABDOMEN PELVIS W CONTRAST  Result Date: 04/18/2022 CLINICAL DATA:  Follow-up intra-abdominal abscess. EXAM: CT ABDOMEN AND PELVIS WITH CONTRAST TECHNIQUE: Multidetector CT imaging of the abdomen and pelvis was performed using the standard protocol following bolus administration of intravenous contrast. RADIATION DOSE REDUCTION: This exam was performed according to the departmental dose-optimization program which includes automated exposure control, adjustment of the mA and/or kV according to patient size and/or use of iterative reconstruction technique. CONTRAST:  157m OMNIPAQUE IOHEXOL 300 MG/ML  SOLN COMPARISON:  Most recent CT 04/10/2022 FINDINGS: Lower chest: Decreased size of left pleural effusion with only trace residual. Associated atelectasis. Hepatobiliary: Punctate hepatic granuloma. No suspicious liver lesion. Gallbladder physiologically distended, no calcified stone. No biliary dilatation. Pancreas: No ductal dilatation or inflammation. Spleen: Mildly enlarged spanning 14.2 cm cranial caudal. No focal splenic abnormality. Small amount of perisplenic fluid adjacent to the inferior pole. Adrenals/Urinary Tract: No adrenal nodule. Previous anterior perirenal fluid collection no abuts the upper pole of the left kidney. There is no hydronephrosis. Small focus of air  in the urinary bladder is likely related to prior instrumentation. No bladder wall thickening. Stomach/Bowel: Decompressed stomach. Few prominent fluid-filled loops of small bowel in the upper abdomen, no obstruction. Enteric contrast reaches the colon. Left lower quadrant colostomy. Stapled off sigmoid colon. Vascular/Lymphatic: Aortic  atherosclerosis. Patent portal, splenic, and superior mesenteric veins. No bulky abdominopelvic adenopathy. Reproductive: Unremarkable. Other: Drainage catheter in the pelvis is looped within a presacral air-fluid fluid collection. This measures approximately 8.4 x 3.4 cm, series 3, image 76, previously 7.9 x 2.5 cm. There is an increased fluid component compared to prior exam. U shaped collections emanating anteriorly from this dominant presacral collection persist, coursing lateral to the bladder, series 3, image 73. These may encircle the bladder and communicate anteriorly, uncertain. These collections measure approximately 1 cm in thickness. The left retroperitoneal collection has slightly diminished, currently 3.1 x 1.4 cm, series 3, image 58, previously 4.7 x 1.6 cm. Left anterior perirenal collection persists, diminished in size currently 2.3 x 1.6 cm, series 3, image 36, previously 3.2 x 2.3 cm. The previous potential connection between these 2 fluid collections has diminished. Surgical drain located within the left anterior abdominal wall musculature has no associated fluid collection. Musculoskeletal: Intramuscular collection within the left obturator musculature measures 3 x 1.6 cm, series 3, image 87, previously 3.2 x 1.8 cm. Stable osseous structures. Stable right thigh intramuscular lipoma IMPRESSION: 1. Drainage catheter in the pelvis is looped within a presacral air-fluid collection. This measures approximately 8.4 x 3.4 cm, previously 7.9 x 2.5 cm. There is an increased fluid component compared to prior exam. U shaped collections emanating anteriorly from this dominant presacral collection coursing lateral to the bladder, 1 cm in thickness. These may encircle the bladder and communicate anteriorly, uncertain. 2. The left retroperitoneal collections have slightly diminished in size, lower quadrant collection currently measures 3.1 x 1.4 cm, anterior pararenal collection 2.3 x 1.6 cm. The previous  potential connection between these 2 fluid collections has diminished. 3. Intramuscular collection within the left obturator musculature measures 3 x 1.6 cm, previously 3.2 x 1.8 cm. 4. Decreased size of left pleural effusion with only trace residual. 5. Mild splenomegaly. Aortic Atherosclerosis (ICD10-I70.0). Electronically Signed   By: Keith Rake M.D.   On: 04/18/2022 20:57   CT FEMUR LEFT W CONTRAST  Result Date: 04/18/2022 CLINICAL DATA:  Soft tissue infection suspected, thigh, xray done EXAM: CT OF THE LOWER LEFT EXTREMITY WITH CONTRAST TECHNIQUE: Multidetector CT imaging of the lower left extremity was performed according to the standard protocol following intravenous contrast administration. RADIATION DOSE REDUCTION: This exam was performed according to the departmental dose-optimization program which includes automated exposure control, adjustment of the mA and/or kV according to patient size and/or use of iterative reconstruction technique. CONTRAST:  176m OMNIPAQUE IOHEXOL 300 MG/ML  SOLN COMPARISON:  Limited MRI 03/29/2022, included portions from prior abdominopelvic CTs. FINDINGS: Bones/Joint/Cartilage No fracture. No periosteal reaction, bone destruction, or erosions. Hip and knee alignment are maintained. Ligaments Suboptimally assessed by CT. Muscles and Tendons Left obturator fluid collection measures approximately 3.1 x 1.4 cm slightly diminished from prior abdominal CT 04/10/2022. There are multiple thin elongated fluid collections within left abductor muscle. Proximally this is seen on series 1, image 42 and series 6, image 25, spanning approximately 5.5 cm in length, subcentimeter in thickness. In the midportion this is seen on image 161 and series 6, image 27, spanning 4.6 cm in length. Distal collection measures 1.6 x 1.1 x 4.6 cm, series 1, image 74 and  series 6, image 24, this collection is peripherally enhancing and thick walled. Soft tissues Mild soft tissue edema.  No  subcutaneous collection. IMPRESSION: 1. Multiple thin elongated fluid collections within the left abductor muscle, suspicious for abscesses. The largest of these measures 1.6 x 1.1 x 4.6 cm distally, thick-walled and peripherally enhancing. 2. Left obturator fluid collection measures 3.1 x 1.4 cm, slightly diminished from prior abdominal CT 04/10/2022. Electronically Signed   By: Keith Rake M.D.   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 04/03/2022   Protein-calorie malnutrition, severe ([place]) 04/03/2022   Anxiety associated with depression 04/03/2022   Intra-abdominal abscess ([place]) 04/03/2022   Thigh abscess 04/03/2022   Normal anion gap metabolic acidosis 19/14/7829   Acute blood loss anemia ([facility]LA) 03/18/2022   Acute urinary retention 03/12/2022   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   Mild persistent asthma 03/12/2022   Abdominal pain 03/09/2022   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/09/2022   Pruritus ani 04/20/2018   Arthritis of both knees 01/13/2018   Hx of adenomatous polyp of colon 04/07/2017   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   Perianal dermatitis 01/14/2016   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/12/2013   History of tobacco use 11/12/2013   Low testosterone 09/19/2013   Sinusitis 12/21/2012   Herpes labialis 10/30/2012   Eczema 10/30/2012   Prolapsed internal hemorrhoids, grade 3, THD ligation 03/07/2022 10/30/2012   Hypertension 04/13/2011   FASCIITIS, PLANTAR 05/14/2010   NEOPLASM, SKIN,  UNCERTAIN BEHAVIOR 56/21/3086   Chronic rhinitis 08/16/2008   Rash and other nonspecific skin eruption 07/02/2008   ABNORMAL COAGULATION PROFILE 05/29/2008   Gastroesophageal reflux disease 06/28/2007   PITYRIASIS ROSEA 06/28/2007   Osteoarthritis of knee 06/28/2007   Asthmatic bronchitis 05/11/2007    Pelvic sepsis.  Currently drained and debrided.    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    OR findings 5/6: significant RP infection, drained via L pericolic gutter, presacral infection, drained, anterior abd wall infection, drained.  CT 5/14: L RP fluid collection slightly small, L thigh fluid collection stable  IR aspiration: 5/16, cultures pending  CT 5/22: improving fluid collection, new left sided small pelvic collection, thigh fluid unchanged but doesn't appear rim enhancing to me  Plan:  Diet as tolerated, protein shakes  ID: Cont IV antibiotics per ID - appreciate excellent assistance in his care.  Pain: Cont scheduled tylenol, tramadol prn, schedule robaxin, dilaudid prn   Anxiety: ativan   Cont SCD's, Lovenox SQ  dressing changes q shift  IR evaluation for additional presacral drainage - appears collections could be loculated at present. Drains may also help reduce amount draining from ischiorectal wound site as well.  Discussed all the above with the patient and partner Mitzi Hansen. They expressed appreciation and agreement with plan. We will follow with you.    LOS: 11 days   Nadeen Landau, MD [facility], [place]  04/20/2022 9:25 AM

## 2022-04-20 NOTE — Consult Note (Signed)
Chief Complaint: Patient was seen in consultation today for abscess drain placement.   Referring Physician(s): Dr. Dema Severin  Supervising Physician: Ruthann Cancer  Patient Status: [facility] - In-pt  History of Present Illness: Tyler [name] is a 71 y.o. male with a medical history significant for anxiety/depression, HTN, basil cell carcinoma (left calf) and hemorrhoids. He was recently hospitalized for abdominal pain, nausea and fever following outpatient transanal hemorrhoidal dearterialization 07/08/22 with Dr. Leighton Ruff. He eventually required anal canal proctoscopy with I&D 03/15/22, also noted to have necrosis of anoderm posteriorly with large right gluteal abscess. He was taken back to the OR 03/20/22 where he was found to have a significant retroperitoneal infection and he underwent abdominal washout with drain placements x 2 and diverting colostomy creation.   He has had a prolonged hospital course complicated by numerous abscesses, fevers, and pain  IR was consulted 03/28/22 for possible abscess aspirations/drain placements but the collections were too small at that time. The patient was seen in IR 03/30/22 for a left posterior thigh fluid collection aspiration and again on 04/12/22 for an intra-abdominal fluid collection aspiration. Recent imaging reveals an enlarging pre-sacral fluid collection.   CT abdomen/pelvis with contrast 04/18/22 IMPRESSION: 1. Drainage catheter in the pelvis is looped within a presacral air-fluid collection. This measures approximately 8.4 x 3.4 cm, previously 7.9 x 2.5 cm. There is an increased fluid component compared to prior exam. U shaped collections emanating anteriorly from this dominant presacral collection coursing lateral to the bladder, 1 cm in thickness. These may encircle the bladder and communicate anteriorly, uncertain. 2. The left retroperitoneal collections have slightly diminished in size, lower quadrant collection currently measures 3.1 x 1.4  cm, anterior pararenal collection 2.3 x 1.6 cm. The previous potential connection between these 2 fluid collections has diminished. 3. Intramuscular collection within the left obturator musculature measures 3 x 1.6 cm, previously 3.2 x 1.8 cm. 4. Decreased size of left pleural effusion with only trace residual. 5. Mild splenomegaly.  Interventional Radiology has been asked to evaluate this patient for an image-guided intra-abdominal fluid collection. Imaging reviewed and procedure approved by Dr. Vernard Gambles.   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), bilateral    Chronic pruritic rash in adult 2004   hx shingles  w/ residual recurrent rash, take valtrex daily   E coli infection 04/12/2022   ED (erectile dysfunction)    GERD (gastroesophageal reflux disease)    H/O pityriasis rosea    History of basal cell carcinoma (BCC) excision    2022 left calf s/p mohs   History of pertussis 2018   History of SCC (squamous cell carcinoma) of skin    removed from head   History of thrombocytopenia 12/03/2010   Hx of adenomatous polyp of colon 04/07/2017   Hypertension    Mild asthma    followed by pcp   OA (osteoarthritis)    knees, fingers   Prolapsed internal hemorrhoids, grade 3    hx  external hemorroid banding   Sepsis due to Enterococcus ([place]) 04/12/2022   Wears glasses     Past Surgical History:  Procedure Laterality Date   COLONOSCOPY WITH PROPOFOL  04/01/2017   by dr Carlean Purl   DIRECT LARYNGOSCOPY  1986   left vocal fold bx   (non-cancerous granuloma)   FINGER SURGERY Left 2017   ligament and tendon repair left  index finger   HEMORRHOID SURGERY N/A 03/15/2022   Procedure: ANAL EXAM UNDER ANESTHESIA WITH PROCTOSCOPY  AND DEBRIDEMENT;  Surgeon: Leighton Ruff, MD;  Location: [facility];  Service: General;  Laterality: N/A;    INGUINAL HERNIA REPAIR Right 05/25/2018   Procedure: RIGHT INGUINAL HERNIA REPAIR WITH MESH;  Surgeon: Jovita Kussmaul, MD;  Location: [facility];  Service: General;  Laterality: Right;   INSERTION OF MESH Right 05/25/2018   Procedure: INSERTION OF MESH;  Surgeon: Jovita Kussmaul, MD;  Location: [place];  Service: General;  Laterality: Right;   KNEE ARTHROSCOPY W/ MENISCAL REPAIR Right 10/26/2005   '@[facility]'$   by Dr  Wynelle Link   LAPAROSCOPIC APPENDECTOMY  03/22/2011   '@[facility]'$    LAPAROSCOPY N/A 03/20/2022   Procedure: DIAGNOSTIC LAPAROSCOPY; ABDOMINAL [place] OUT AND DRAIN PLACEMENT; CREATION OF DIVERTING COLOSTOMY; ANAL EXAM UNDER ANESTHESIA;  Surgeon: Leighton Ruff, MD;  Location: [facility];  Service: General;  Laterality: N/A;   MOHS SURGERY  05/2021   left lower leg   TONSILLECTOMY  1958   TRANSANAL HEMORRHOIDAL DEARTERIALIZATION N/A 03/05/2022   Procedure: TRANSANAL HEMORRHOIDAL DEARTERIALIZATION;  Surgeon: Leighton Ruff, MD;  Location: [place];  Service: General;  Laterality: N/A;    Allergies: Fluconazole, Griseofulvin, Sulfa antibiotics, Oxycodone, Mometasone furo-formoterol fum, and Retapamulin  Medications: Prior to Admission medications   Medication Sig Start Date End Date Taking? Authorizing Provider  acetaminophen (TYLENOL) 500 MG tablet Take 2 tablets (1,000 mg total) by mouth every 6 (six) hours as needed for mild pain. 05/06/28   Leighton Ruff, MD  albuterol (VENTOLIN HFA) 108 (90 Base) MCG/ACT inhaler Inhale 2 puffs into the lungs every 6 (six) hours as needed for wheezing or shortness of breath.    [provider]  amLODipine (NORVASC) 5 MG tablet TAKE 1 TABLET BY MOUTH DAILY Patient taking differently: Take 5 mg by mouth in the morning. 04/29/14   Ricard Dillon, MD  ascorbic acid (VITAMIN C) 500 MG tablet Take 1 tablet (500 mg total) by mouth 2 (two) times daily. 7/98/92   Leighton Ruff, MD  azelastine (ASTELIN) 0.1 % nasal  spray Place 1 spray into both nostrils 2 (two) times daily as needed for rhinitis. Use in each nostril as directed    [provider]  bisoprolol (ZEBETA) 10 MG tablet Take 10 mg by mouth at bedtime.    [provider]  diphenhydrAMINE (BENADRYL) 25 mg capsule Take 1 capsule (25 mg total) by mouth every 6 (six) hours as needed for itching, allergies or sleep (anxiety). 12/04/39   Leighton Ruff, MD  feeding supplement (ENSURE ENLIVE / ENSURE PLUS) LIQD Take 237 mLs by mouth 3 (three) times daily between meals. 7/40/81   Leighton Ruff, MD  ferrous sulfate 325 (65 FE) MG tablet Take 1 tablet (325 mg total) by mouth 2 (two) times daily with a meal. 4/48/18   Leighton Ruff, MD  ibuprofen (ADVIL) 400 MG tablet Take 1-2 tablets (400-800 mg total) by mouth every 8 (eight) hours as needed for fever, mild pain or cramping. 5/63/14   Leighton Ruff, MD  loperamide (IMODIUM) 2 MG capsule Take 1 capsule (2 mg total) by mouth with breakfast, with lunch, and with evening meal. 9/70/26   Leighton Ruff, MD  methocarbamol 1000 MG TABS Take 1,000 mg by mouth every 8 (eight) hours as needed for muscle spasms. 3/78/58   Leighton Ruff, MD  micafungin in sodium chloride 0.9 % 100 mL '100mg'$  IV q24h 04/09/22  Leighton Ruff, MD  montelukast (SINGULAIR) 10 MG tablet TAKE 1 TABLET BY MOUTH EVERY NIGHT AT BEDTIME Patient taking differently: Take 10 mg by mouth at bedtime. 09/10/13   Ricard Dillon, MD  Multiple Vitamin (MULTIVITAMIN WITH MINERALS) TABS tablet Take 1 tablet by mouth daily. 03/03/61   Leighton Ruff, MD  NEXIUM 22LN 20 MG capsule Take 20 mg by mouth daily before breakfast.    [provider]  ondansetron (ZOFRAN-ODT) 4 MG disintegrating tablet Take 1 tablet (4 mg total) by mouth every 6 (six) hours as needed for nausea. 9/89/21   Leighton Ruff, MD  piperacillin-tazobactam (ZOSYN) 3.375 GM/50ML IVPB Inject 50 mLs (3.375 g total) into the vein every 8 (eight) hours. 1/94/17   Leighton Ruff, MD  polycarbophil (FIBERCON) 625 MG tablet Take 1 tablet (625 mg total) by mouth 2 (two) times daily. 02/20/13   Leighton Ruff, MD  sodium chloride flush (NS) 0.9 % SOLN 10-40 mLs by Intracatheter route every 12 (twelve) hours. 4/81/85   Leighton Ruff, MD  sodium chloride flush (NS) 0.9 % SOLN 10-40 mLs by Intracatheter route as needed (flush). 6/31/49   Leighton Ruff, MD  tadalafil (CIALIS) 10 MG tablet Take 10 mg by mouth daily as needed for erectile dysfunction.    [provider]  traMADol (ULTRAM) 50 MG tablet Take 1-2 tablets (50-100 mg total) by mouth every 6 (six) hours as needed for moderate pain or severe pain. 05/16/62   Leighton Ruff, MD  traZODone (DESYREL) 50 MG tablet Take 1 tablet (50 mg total) by mouth at bedtime as needed for sleep. 7/85/88   Leighton Ruff, MD  valACYclovir (VALTREX) 500 MG tablet Take 500 mg by mouth in the morning.    [provider]     Family History  Problem Relation Age of Onset   AAA (abdominal aortic aneurysm) Mother    Glaucoma Sister    Stomach cancer Paternal Grandfather    Colon cancer Neg Hx     Social History   Socioeconomic History   Marital status: Single    Spouse name: Not on file   Number of children: Not on file   Years of education: Not on file   Highest education level: Not on file  Occupational History   Occupation: Professor    Employer: [employer]  Tobacco Use   Smoking status: Former    Years: 20.00    Types: Cigarettes    Quit date: 1990    Years since quitting: 33.4   Smokeless tobacco: Never  Vaping Use   Vaping Use: Never used  Substance and Sexual Activity   Alcohol use: Yes    Alcohol/week: 28.0 standard drinks    Types: 28 Standard drinks or equivalent per week    Comment: 4 cocktails daily   Drug use: Never   Sexual activity: Yes    Partners: Male  Other Topics Concern   Not on file  Social History Narrative   Patient reports he is single 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  Male partners   Former smoker   No substance abuse   Alcohol 2/day   Social Determinants of Health   Financial Resource Strain: Not on file  Food Insecurity: Not on file  Transportation Needs: Not on file  Physical Activity: Not on file  Stress: Not on file  Social Connections: Not on file    Review of Systems: A 12 point ROS discussed and pertinent positives  are indicated in the HPI above.  All other systems are negative.  Review of Systems  Constitutional:  Negative for appetite change and fatigue.  Respiratory:  Negative for cough and shortness of breath.   Cardiovascular:  Negative for chest pain and leg swelling.  Gastrointestinal:  Positive for abdominal pain and rectal pain. Negative for nausea and vomiting.  Neurological:  Negative for dizziness and headaches.   Vital Signs: BP 123/89 (BP Location: Left Arm)   Pulse 75   Temp 97.8 F (36.6 C) (Oral)   Resp 18   Ht '5\' 9"'$  (1.753 m)   Wt 165 lb 9.1 oz (75.1 kg)   SpO2 98%   BMI 24.45 kg/m   Physical Exam Constitutional:      General: He is not in acute distress.    Appearance: He is not ill-appearing.  HENT:     Mouth/Throat:     Mouth: Mucous membranes are moist.     Pharynx: Oropharynx is clear.  Cardiovascular:     Comments: Right upper arm PICC Pulmonary:     Effort: Pulmonary effort is normal.     Breath sounds: Normal breath sounds.  Abdominal:     General: Bowel sounds are normal.     Palpations: Abdomen is soft.     Comments: Two RLQ surgical drains. One drain filled with approximately 30 ml tan fluid in bulb. One drain with approximately 15 ml serosanguineous fluid in bulb. Dressing clean, dry, intact.   Colostomy  Skin:    General: Skin is warm and dry.     Comments: Patient reports rectal wound with dressing. Site not viewed.   Neurological:     Mental Status: He is alert and oriented to person, place, and time.    Imaging: DG Chest 2  View  Result Date: 04/09/2022 CLINICAL DATA:  Shortness of breath. EXAM: CHEST - 2 VIEW COMPARISON:  03/28/2022. FINDINGS: Heart is enlarged and the mediastinal contour is stable. There is a small pleural effusion on the left with atelectasis. The right lung is clear. No pneumothorax. A right PICC line terminates over the superior vena cava. No acute osseous abnormality. IMPRESSION: Small left pleural effusion with atelectasis at the left lung base. Electronically Signed   By: Brett Fairy M.D.   On: 04/09/2022 22:33   CT ABDOMEN PELVIS W CONTRAST  Result Date: 04/18/2022 CLINICAL DATA:  Follow-up intra-abdominal abscess. EXAM: CT ABDOMEN AND PELVIS WITH CONTRAST TECHNIQUE: Multidetector CT imaging of the abdomen and pelvis was performed using the standard protocol following bolus administration of intravenous contrast. RADIATION DOSE REDUCTION: This exam was performed according to the departmental dose-optimization program which includes automated exposure control, adjustment of the mA and/or kV according to patient size and/or use of iterative reconstruction technique. CONTRAST:  189m OMNIPAQUE IOHEXOL 300 MG/ML  SOLN COMPARISON:  Most recent CT 04/10/2022 FINDINGS: Lower chest: Decreased size of left pleural effusion with only trace residual. Associated atelectasis. Hepatobiliary: Punctate hepatic granuloma. No suspicious liver lesion. Gallbladder physiologically distended, no calcified stone. No biliary dilatation. Pancreas: No ductal dilatation or inflammation. Spleen: Mildly enlarged spanning 14.2 cm cranial caudal. No focal splenic abnormality. Small amount of perisplenic fluid adjacent to the inferior pole. Adrenals/Urinary Tract: No adrenal nodule. Previous anterior perirenal fluid collection no abuts the upper pole of the left kidney. There is no hydronephrosis. Small focus of air in the urinary bladder is likely related to prior instrumentation. No bladder wall thickening. Stomach/Bowel:  Decompressed stomach. Few prominent fluid-filled loops of small bowel  in the upper abdomen, no obstruction. Enteric contrast reaches the colon. Left lower quadrant colostomy. Stapled off sigmoid colon. Vascular/Lymphatic: Aortic atherosclerosis. Patent portal, splenic, and superior mesenteric veins. No bulky abdominopelvic adenopathy. Reproductive: Unremarkable. Other: Drainage catheter in the pelvis is looped within a presacral air-fluid fluid collection. This measures approximately 8.4 x 3.4 cm, series 3, image 76, previously 7.9 x 2.5 cm. There is an increased fluid component compared to prior exam. U shaped collections emanating anteriorly from this dominant presacral collection persist, coursing lateral to the bladder, series 3, image 73. These may encircle the bladder and communicate anteriorly, uncertain. These collections measure approximately 1 cm in thickness. The left retroperitoneal collection has slightly diminished, currently 3.1 x 1.4 cm, series 3, image 58, previously 4.7 x 1.6 cm. Left anterior perirenal collection persists, diminished in size currently 2.3 x 1.6 cm, series 3, image 36, previously 3.2 x 2.3 cm. The previous potential connection between these 2 fluid collections has diminished. Surgical drain located within the left anterior abdominal wall musculature has no associated fluid collection. Musculoskeletal: Intramuscular collection within the left obturator musculature measures 3 x 1.6 cm, series 3, image 87, previously 3.2 x 1.8 cm. Stable osseous structures. Stable right thigh intramuscular lipoma IMPRESSION: 1. Drainage catheter in the pelvis is looped within a presacral air-fluid collection. This measures approximately 8.4 x 3.4 cm, previously 7.9 x 2.5 cm. There is an increased fluid component compared to prior exam. U shaped collections emanating anteriorly from this dominant presacral collection coursing lateral to the bladder, 1 cm in thickness. These may encircle the bladder and  communicate anteriorly, uncertain. 2. The left retroperitoneal collections have slightly diminished in size, lower quadrant collection currently measures 3.1 x 1.4 cm, anterior pararenal collection 2.3 x 1.6 cm. The previous potential connection between these 2 fluid collections has diminished. 3. Intramuscular collection within the left obturator musculature measures 3 x 1.6 cm, previously 3.2 x 1.8 cm. 4. Decreased size of left pleural effusion with only trace residual. 5. Mild splenomegaly. Aortic Atherosclerosis (ICD10-I70.0). Electronically Signed   By: Keith Rake M.D.   On: 04/18/2022 20:57   CT ABDOMEN PELVIS W CONTRAST  Result Date: 04/10/2022 CLINICAL DATA:  Intra-abdominal abscess EXAM: CT ABDOMEN AND PELVIS WITH CONTRAST TECHNIQUE: Multidetector CT imaging of the abdomen and pelvis was performed using the standard protocol following bolus administration of intravenous contrast. RADIATION DOSE REDUCTION: This exam was performed according to the departmental dose-optimization program which includes automated exposure control, adjustment of the mA and/or kV according to patient size and/or use of iterative reconstruction technique. CONTRAST:  126m OMNIPAQUE IOHEXOL 300 MG/ML  SOLN COMPARISON:  04/05/2021 FINDINGS: Lower chest: Small left pleural effusion and associated atelectasis or consolidation, similar to prior examination. Coronary artery disease. Hepatobiliary: No solid liver abnormality is seen. No gallstones, gallbladder wall thickening, or biliary dilatation. Pancreas: Unremarkable. No pancreatic ductal dilatation or surrounding inflammatory changes. Spleen: Normal in size without significant abnormality. Adrenals/Urinary Tract: Adrenal glands are unremarkable. Kidneys are normal, without renal calculi, solid lesion, or hydronephrosis. Bladder is unremarkable. Stomach/Bowel: Stomach is within normal limits. Appendix is surgically absent. Left lower quadrant diverting colostomy.  Vascular/Lymphatic: Aortic atherosclerosis. No enlarged abdominal or pelvic lymph nodes. Reproductive: No mass or other significant abnormality. Other: Left lower quadrant colostomy. There is a complex left retroperitoneal fluid collection, with components that appear to communicate overlying the Gerota's fascia and left psoas musculature (series 3, image 39, 59). A previously seen surgical drain within the inferior aspect of this collection has  been removed. The superior aspect of the collection is diminished in size, measuring 3.2 x 2.3 cm, previously 4.4 x 2.8 cm (series 3, image 37). Inferior aspect of the collection is similar, measuring 4.7 x 1.6 cm (series 3, image 59). Additional presacral air and fluid collection contains 1 surgical drain, others removed and is similar in size, measuring 7.9 x 2.5 cm (series 3, image 75). Anasarca. Musculoskeletal: No acute or significant osseous findings. IMPRESSION: 1. There is a complex left retroperitoneal fluid collection, with superior and inferior components that appear to communicate overlying the Gerota's fascia and left psoas musculature. A previously seen surgical drain within the inferior aspect of this collection has been removed. The superior aspect of the collection is diminished in size, measuring 3.2 x 2.3 cm, previously 4.4 x 2.8 cm. Inferior aspect of the collection is similar, measuring 4.7 x 1.6 cm. 2. Additional presacral air and fluid collection contains one surgical drain, others removed and is similar in size, measuring 7.9 x 2.5 cm. 3. Left lower quadrant diverting colostomy. 4. Small left pleural effusion and associated atelectasis or consolidation, similar to prior examination. 5. Coronary artery disease. Aortic Atherosclerosis (ICD10-I70.0). Electronically Signed   By: Delanna Ahmadi M.D.   On: 04/10/2022 16:11   CT ABDOMEN PELVIS W CONTRAST  Result Date: 04/05/2022 CLINICAL DATA:  Intra-abdominal abscess. Recent diverting colostomy. EXAM: CT  ABDOMEN AND PELVIS WITH CONTRAST TECHNIQUE: Multidetector CT imaging of the abdomen and pelvis was performed using the standard protocol following bolus administration of intravenous contrast. RADIATION DOSE REDUCTION: This exam was performed according to the departmental dose-optimization program which includes automated exposure control, adjustment of the mA and/or kV according to patient size and/or use of iterative reconstruction technique. CONTRAST:  138m OMNIPAQUE IOHEXOL 300 MG/ML  SOLN COMPARISON:  03/28/2022 and earlier FINDINGS: Lower chest: There is minimal atelectasis at the RIGHT lung base. Slight interval improvement in small RIGHT pleural effusion. There has been slight improvement in LEFT pleural effusion and LEFT basilar atelectasis/consolidation. Heart size is normal. Hepatobiliary: No focal liver abnormality is seen. No radiopaque gallstones, biliary dilatation, or pericholecystic inflammatory changes. Pancreas: Unremarkable. No pancreatic ductal dilatation or surrounding inflammatory changes. Spleen: Normal in size without focal abnormality. Adrenals/Urinary Tract: Normal appearance of the adrenal glands and kidneys. Urinary bladder is decompressed by a Foley catheter. Stomach/Bowel: Stomach and small bowel loops are normal in appearance. Diverting colostomy in the LEFT mid abdomen is again noted. There is mild stranding surrounding the colonic loops and mesentery in the LEFT mid abdomen, similar in appearance to prior study. No evidence for obstructing lesion. Vascular/Lymphatic: There is atherosclerotic calcification of the abdominal aorta. No retroperitoneal or mesenteric adenopathy. Reproductive: Prostate is unremarkable. Other: No interval change in the appearance of surgical drains. No new fluid collections surrounding any of the surgical drains. There is similar appearance of presacral locules of gas. Similar appearance bilateral pelvic wall gas. There is a possible developing small fluid  collection along the LEFT pelvic sidewall measures 5.2 x 2.2 centimeters on image 75 of series 2. Air-fluid collection identified anterior to the LEFT kidney is 4.4 x 2.8 centimeters (image 35 of series 2). Previously, this collection measured 4.8 x 4.0 centimeters Significant improvement in body wall edema. Musculoskeletal: No acute or significant osseous findings. There are persistent low-attenuation small fluid collections in the proximal LEFT thigh, similar in appearance to prior studies. Largest collection is in the LEFT pectineus, measuring 1.9 x 3.3 centimeters on image 84 of series 2. Smaller collections are  seen in the LEFT obturator and proximal adductors. (Images 86 of series 2 and 94 series 2. The appearance is similar compared to prior studies. IMPRESSION: 1. Improvement in small bilateral pleural effusions and bibasilar atelectasis/consolidation. 2. Improvement in body wall edema. 3. Smaller air-fluid collection anterior to the LEFT kidney. 4. Similar postoperative and inflammatory changes in the LEFT mid abdomen. 5. Possible developing small fluid collection in the LEFT pelvic sidewall, 5.2 x 2.2 centimeters. 6. Unchanged appearance of multiple surgical drains. No new fluid collections adjacent to the drains. 7. Similar appearance of multiple probable intramuscular abscesses in the proximal LEFT thigh. Electronically Signed   By: Nolon Nations M.D.   On: 04/05/2022 16:59   CT ABDOMEN PELVIS W CONTRAST  Result Date: 03/28/2022 CLINICAL DATA:  Abdominal pain, recent diverting colostomy. EXAM: CT ABDOMEN AND PELVIS WITH CONTRAST TECHNIQUE: Multidetector CT imaging of the abdomen and pelvis was performed using the standard protocol following bolus administration of intravenous contrast. RADIATION DOSE REDUCTION: This exam was performed according to the departmental dose-optimization program which includes automated exposure control, adjustment of the mA and/or kV according to patient size and/or  use of iterative reconstruction technique. CONTRAST:  130m OMNIPAQUE IOHEXOL 300 MG/ML  SOLN COMPARISON:  03/26/2022 FINDINGS: Lower chest: Small bilateral pleural effusions and dependent lower lobe atelectasis, left greater than right. Stable a cardiomegaly. Hepatobiliary: No focal liver abnormality is seen. No gallstones, gallbladder wall thickening, or biliary dilatation. Pancreas: Unremarkable. No pancreatic ductal dilatation or surrounding inflammatory changes. Spleen: Normal in size without focal abnormality. Adrenals/Urinary Tract: Kidneys enhance normally and symmetrically. The adrenals are unremarkable. Bladder is decompressed with a Foley catheter. Stomach/Bowel: Left lower quadrant diverting colostomy is again identified, with mild distal colonic wall thickening just proximal to the colostomy site again noted. No evidence of bowel obstruction or ileus. Vascular/Lymphatic: Stable aortic atherosclerosis. Multiple subcentimeter lymph nodes in the mesentery are likely reactive. No pathologic adenopathy. Reproductive: Prostate is unremarkable. Other: Persistent inflammatory changes in the perirectal and presacral tissues, with soft tissue gas seen in the presacral space and extending along the bilateral pelvic sidewalls unchanged. There is a surgical drain in the presacral space via a right gluteal approach unchanged. There are 3 additional surgical drains. Most inferior drain is within the lower anterior abdominal wall within the rectus musculature, with no surrounding fluid collection. Two other drains entering the peritoneal space view of the right lower quadrant and left lower quadrant are coiled within the lower pelvis. The drains are unchanged in appearance. Inflammatory changes are again seen within the left lower quadrant mesentery. A stable gas and fluid collection anterior to the left kidney is again identified, measuring approximately 4.8 x 4.0 x 7.2 cm, slightly decreased in size since prior  study. This is likely contiguous with the area drained by the left lower quadrant surgical drain. Musculoskeletal: No acute or destructive bony lesions. Intramuscular gas/fluid collections are seen within the medial and posterior proximal left thigh, concerning for intramuscular abscesses. Largest collection within the hamstring musculature proximal left thigh image 106/2 measures 5.1 x 3.1 cm. Reconstructed images demonstrate no additional findings. IMPRESSION: 1. Persistent gas and fluid collection anterior to the left kidney, likely contiguous with the area drain by the left lower quadrant surgical drain. Slight interval decrease in size since prior study. 2. Extensive inflammatory changes within the presacral space and along the pelvic sidewall, with numerous surgical drains as above, without significant change since prior study. 3. Interval progression of intramuscular fluid collections within the medial and  posterior musculature of the proximal left thigh, concerning for intramuscular abscesses. Largest collection within the left hamstring musculature as above. 4. Left lower quadrant colostomy, with persistent wall thickening of the distal colon just proximal to the colostomy site. This may be secondary inflammatory change due to the intraperitoneal process, versus primary colitis. 5. Bilateral pleural effusions and dependent atelectasis, left greater than right, with slight progression since prior study. 6.  Aortic Atherosclerosis (ICD10-I70.0). Electronically Signed   By: Randa Ngo M.D.   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  RADIATION DOSE REDUCTION: This exam was performed according to the departmental dose-optimization program which includes automated exposure control, adjustment of the mA and/or kV according to patient size and/or use of iterative reconstruction technique. CONTRAST:  164m OMNIPAQUE IOHEXOL 300 MG/ML  SOLN COMPARISON:  Pelvis CT 03/19/2022 FINDINGS: Lower chest: There is some dependent collapse/consolidative opacity in the lower lobes, left greater than right with small left and tiny right pleural effusions. Hepatobiliary: No suspicious focal abnormality within the liver parenchyma. There is no evidence for gallstones, gallbladder wall thickening, or pericholecystic fluid. No intrahepatic or extrahepatic biliary dilation. Pancreas: No focal mass lesion. No dilatation of the main duct. No intraparenchymal cyst. No peripancreatic edema. Spleen: No splenomegaly. No focal mass lesion. Adrenals/Urinary Tract: No adrenal nodule or mass. Kidneys unremarkable. No evidence for hydroureter. Bladder is decompressed by Foley catheter. Gas in the bladder lumen is compatible with the instrumentation. Stomach/Bowel: Stomach is unremarkable. No gastric wall thickening. No evidence of outlet obstruction. Duodenum is normally positioned as is the ligament of Treitz. No small bowel wall thickening. No small bowel dilatation. The terminal ileum is normal. The appendix is not well visualized, but there is no edema or inflammation in the region of the cecum. Right and transverse colon unremarkable. Left colon and sigmoid colon are decompressed with left abdominal sigmoid colostomy evident. The wall of the descending and sigmoid colon is irregular, likely secondary to the adjacent inflammatory changes (see below). Vascular/Lymphatic: There is mild atherosclerotic calcification of the abdominal aorta without aneurysm. Portal vein, superior mesenteric vein, and splenic vein are patent. Celiac axis and SMA are patent. There is no  gastrohepatic or hepatoduodenal ligament lymphadenopathy. No retroperitoneal or mesenteric lymphadenopathy. No pelvic sidewall lymphadenopathy. Reproductive: The prostate gland and seminal vesicles are unremarkable. Other: There is extensive edema/inflammation and soft tissue gas in the pelvic floor presacral space. Surgical drain noted posterior to the rectum and appears to track laterally and anteriorly to the left, potentially through the reported defect of the anal wall described in the surgical note (axial image 90/2 and coronal 64/4). Gas and fluid tracks up in the presacral space and a second surgical drain entering from the anterolateral right lower abdominal wall is position within the pre sacral abscess cavity. A second more inferiorly placed right lower quadrant surgical drain is positioned in the left rectus sheath. A third drain entering from the left lower quadrant is positioned in the left paracolic gutter. Gas and edema tracks in the extraperitoneal tissues from the presacral space cranially into the retroperitoneal abdomen extending up along the left anterior pararenal fascia and posterior peritoneum. 5.3 x 3.5 x 7.2 cm collection  of gas and fluid is identified in the left upper quadrant, anterior to the kidney and medial to the spleen. This tracks down to the peritoneal thickening along the left paracolic gutter but is unclear by CT whether this will be decompressed by the left paracolic gutter drain. Musculoskeletal: No worrisome lytic or sclerotic osseous abnormality. Extensive subcutaneous edema is identified in the lower abdominal and pelvic sidewall. Intermuscular fluid/edema is identified in both thighs, left greater than right with fluid tracking along the left adductor muscles and intermuscular fluid in the compartments of the anterior left thigh. Imaging was carried down to a position just caudal to the femoral neck to the level of the proximal femoral diaphyses. Several tiny gas bubbles  are seen in the fluid associated with the anterior left thigh muscles (see image 93/2 and 92/2). IMPRESSION: 1. There is extensive edema/inflammation and soft tissue gas in the pelvic floor and presacral space. Surgical drain noted posterior to the rectum and appears to track laterally and anteriorly to the left, potentially through the reported defect of the anal wall described in the surgical note. The fluid and gas tracks cranially in the retroperitoneal space of the abdomen and laterally and to the left along the anterior pararenal space and posterior peritoneum towards the paracolic gutter. 2. 5.3 x 3.5 x 7.2 cm collection of gas and fluid in the left upper quadrant, anterior to the kidney and medial to the spleen. This tracks down to the peritoneal thickening along the left paracolic gutter but is unclear by CT whether this will be decompressed by the left paracolic gutter drain. 3. Intermuscular fluid/edema is identified the region of both hips and proximal thigh bilaterally, left greater than right with fluid tracking along the left adductor muscles and intermuscular fluid in the compartments of the anterior left thigh. Several tiny gas bubbles are seen in the fluid associated with the anterior left thigh muscles. Imaging appearance raises concern for cellulitis/myositis. 4. Small left and tiny right pleural effusions with dependent collapse/consolidative opacity in the lower lobes, left greater than right. 5. Left abdominal sigmoid colostomy. Descending and sigmoid colon shows ill definition of the wall and mural edema, potentially secondary to the inflammatory process in the pelvis and left abdomen. 6. Aortic Atherosclerosis (ICD10-I70.0). Electronically Signed   By: Misty Stanley M.D.   On: 03/26/2022 10:27   CT FEMUR LEFT W CONTRAST  Result Date: 04/18/2022 CLINICAL DATA:  Soft tissue infection suspected, thigh, xray done EXAM: CT OF THE LOWER LEFT EXTREMITY WITH CONTRAST TECHNIQUE: Multidetector CT  imaging of the lower left extremity was performed according to the standard protocol following intravenous contrast administration. RADIATION DOSE REDUCTION: This exam was performed according to the departmental dose-optimization program which includes automated exposure control, adjustment of the mA and/or kV according to patient size and/or use of iterative reconstruction technique. CONTRAST:  18m OMNIPAQUE IOHEXOL 300 MG/ML  SOLN COMPARISON:  Limited MRI 03/29/2022, included portions from prior abdominopelvic CTs. FINDINGS: Bones/Joint/Cartilage No fracture. No periosteal reaction, bone destruction, or erosions. Hip and knee alignment are maintained. Ligaments Suboptimally assessed by CT. Muscles and Tendons Left obturator fluid collection measures approximately 3.1 x 1.4 cm slightly diminished from prior abdominal CT 04/10/2022. There are multiple thin elongated fluid collections within left abductor muscle. Proximally this is seen on series 1, image 42 and series 6, image 25, spanning approximately 5.5 cm in length, subcentimeter in thickness. In the midportion this is seen on image 161 and series 6, image 27, spanning 4.6 cm in  length. Distal collection measures 1.6 x 1.1 x 4.6 cm, series 1, image 74 and series 6, image 24, this collection is peripherally enhancing and thick walled. Soft tissues Mild soft tissue edema.  No subcutaneous collection. IMPRESSION: 1. Multiple thin elongated fluid collections within the left abductor muscle, suspicious for abscesses. The largest of these measures 1.6 x 1.1 x 4.6 cm distally, thick-walled and peripherally enhancing. 2. Left obturator fluid collection measures 3.1 x 1.4 cm, slightly diminished from prior abdominal CT 04/10/2022. Electronically Signed   By: Keith Rake M.D.   On: 04/18/2022 21:03   MR FEMUR LEFT WO CONTRAST  Result Date: 03/29/2022 CLINICAL DATA:  History of hemorrhoid surgery and now has left thigh pain from hip to the knee. EXAM: MR OF THE  LEFT FEMUR WITHOUT CONTRAST TECHNIQUE: Multiplanar, multisequence MR imaging of the left femur was performed. No intravenous contrast was administered. COMPARISON:  None Available. FINDINGS: Incomplete examination, patient was unable to tolerate and complete examination. Only axial T1 and coronal T1 and STIR images were obtained. Bones/Joint/Cartilage No appreciable fracture of the partially imaged left femur. No appreciable joint effusion. Degenerative changes of the left hip joint without evidence of significant joint effusion. Muscles and Tendons Muscles are normal in bulk and signal. No intramuscular hematoma or significant atrophy. Soft tissues Skin and subcutaneous soft tissues are within normal limits. IMPRESSION: Limited examination of the left femur. Within the above-mentioned limitations no acute osseous or soft tissue abnormality. Electronically Signed   By: Keane Police D.O.   On: 03/29/2022 21:59   CT ASPIRATION  Result Date: 04/12/2022 INDICATION: Abdominal abscess EXAM: CT-guided aspiration of abdominal abscess TECHNIQUE: Multidetector CT imaging of the abdomen was performed following the standard protocol without IV contrast. RADIATION DOSE REDUCTION: This exam was performed according to the departmental dose-optimization program which includes automated exposure control, adjustment of the mA and/or kV according to patient size and/or use of iterative reconstruction technique. MEDICATIONS: The patient is currently admitted to the hospital and receiving intravenous antibiotics. The antibiotics were administered within an appropriate time frame prior to the initiation of the procedure. ANESTHESIA/SEDATION: Moderate (conscious) sedation was employed during this procedure. A total of Versed 2 mg and Fentanyl 100 mcg was administered intravenously by the radiology nurse. Total intra-service moderate Sedation Time: 10 minutes. The patient's level of consciousness and vital signs were monitored  continuously by radiology nursing throughout the procedure under my direct supervision. COMPLICATIONS: None immediate. PROCEDURE: Informed written consent was obtained from the patient after a thorough discussion of the procedural risks, benefits and alternatives. All questions were addressed. Maximal Sterile Barrier Technique was utilized including caps, mask, sterile gowns, sterile gloves, sterile drape, hand hygiene and skin antiseptic. A timeout was performed prior to the initiation of the procedure. The patient was placed supine on the exam table. Limited CT of the abdomen was performed for planning purposes. This again demonstrated a small collection in the left lower quadrant. Skin entry site was marked, the overlying skin was prepped and draped in a standard sterile fashion. Local analgesia was obtained with 1% lidocaine. Using intermittent CT fluoroscopy, a 19 gauge Yueh catheter was advanced towards the identified left lower quadrant collection. Location was confirmed with CT and return of purulent material. Primary aspiration was performed as the collection was felt to be too small for safe drain placement. A total of 15 mL of purulent material was aspirated, a sample was sent to the lab for analysis. Postprocedure CT demonstrated no hematoma common appropriate decompression  of the abscess cavity. A clean dressing was placed. The patient tolerated the procedure well without immediate complication. IMPRESSION: Successful CT-guided aspiration of left lower quadrant abscess, with removal of 15 mL of purulent material. Sample sent to the lab for microbiology analysis. Electronically Signed   By: Albin Felling M.D.   On: 04/12/2022 10:43   DG CHEST PORT 1 VIEW  Result Date: 03/28/2022 CLINICAL DATA:  Fever EXAM: PORTABLE CHEST 1 VIEW COMPARISON:  03/27/2022 FINDINGS: Unchanged AP portable examination with small, layering left pleural effusion. Cardiomegaly. Right upper extremity PICC. IMPRESSION:  Unchanged AP portable examination with small, layering left pleural effusion. Cardiomegaly. Electronically Signed   By: Delanna Ahmadi M.D.   On: 03/28/2022 10:34   DG Chest Portable 1 View  Result Date: 03/27/2022 CLINICAL DATA:  Cough and fever. EXAM: PORTABLE CHEST 1 VIEW COMPARISON:  Mar 19, 2022 FINDINGS: There is stable right-sided PICC line positioning. The cardiac silhouette is mildly enlarged and unchanged in size. Decreased lung volumes are seen. Mild atelectasis and/or infiltrate is noted within the left lung base. A very small left pleural effusion is suspected. No pneumothorax is identified. The visualized skeletal structures are unremarkable. IMPRESSION: Mild left basilar atelectasis and/or infiltrate with a very small left pleural effusion. Electronically Signed   By: Virgina Norfolk M.D.   On: 03/27/2022 01:14   [place] LT LOWER EXTREM LTD SOFT TISSUE NON VASCULAR  Result Date: 03/30/2022 CLINICAL DATA:  71 year old male with history of recent diverting colostomy status post hemorrhoid embolization procedure with development of left lower extremity swelling and possible fluid collections. EXAM: ULTRASOUND LEFT LOWER EXTREMITY LIMITED TECHNIQUE: Ultrasound examination of the lower extremity soft tissues was performed in the area of clinical concern. COMPARISON:  03/28/2022, 03/29/2022 FINDINGS: Within the mid to distal posterolateral thigh musculature are multifocal, irregularly-shaped heterogeneously hypoechoic structures without significant internal vascularity. Given morphologic irregularity, the fluid collections are difficult to measure in entirety. IMPRESSION: Multifocal, irregularly-shaped mildly complex fluid collections within the mid to distal left posterolateral thigh musculature. Favored to represent abscesses given findings on comparison studies. Ruthann Cancer, MD [facility] Electronically Signed   By: Ruthann Cancer M.D.   On:  03/30/2022 10:06   VAS [place] LOWER EXTREMITY VENOUS (DVT)  Result Date: 03/26/2022  Lower Venous DVT Study Patient Name:  ONIS MARKOFF  Date of Exam:   03/25/2022 Medical Rec #: [id number]          Accession #:    [id number] Date of Birth: [birth year]          Patient Gender: M Patient Age:   42 years Exam Location:  [facility] Procedure:      VAS [place] LOWER EXTREMITY VENOUS (DVT) Referring Phys: Leighton Ruff --------------------------------------------------------------------------------  Indications: Swelling.  Risk Factors: None identified. Limitations: Patient pain tolerance. Comparison Study: 03-24-2022 Bilateral upper and lower extremity venous studies                   were negative for DVT. Performing Technologist: Darlin Coco RDMS, RVT  Examination Guidelines: A complete evaluation includes B-mode imaging, spectral Doppler, color Doppler, and power Doppler as needed of all accessible portions of each vessel. Bilateral testing is considered an integral part of a complete examination. Limited examinations for reoccurring indications may be performed as noted. The reflux portion of the exam is performed with the patient in reverse Trendelenburg.  +-----+---------------+---------+-----------+----------+--------------+ RIGHTCompressibilityPhasicitySpontaneityPropertiesThrombus Aging +-----+---------------+---------+-----------+----------+--------------+ CFV  Full  Yes      Yes                                 +-----+---------------+---------+-----------+----------+--------------+   +---------+---------------+---------+-----------+----------+--------------+ LEFT     CompressibilityPhasicitySpontaneityPropertiesThrombus Aging +---------+---------------+---------+-----------+----------+--------------+ CFV      Full           Yes      Yes                                 +---------+---------------+---------+-----------+----------+--------------+ SFJ      Full                                                         +---------+---------------+---------+-----------+----------+--------------+ FV Prox  Full                                                        +---------+---------------+---------+-----------+----------+--------------+ FV Mid   Full                                                        +---------+---------------+---------+-----------+----------+--------------+ FV DistalFull                                                        +---------+---------------+---------+-----------+----------+--------------+ PFV      Full                                                        +---------+---------------+---------+-----------+----------+--------------+ POP      Full           Yes      Yes                                 +---------+---------------+---------+-----------+----------+--------------+ PTV      Full                                                        +---------+---------------+---------+-----------+----------+--------------+ PERO     Full                                                        +---------+---------------+---------+-----------+----------+--------------+ Gastroc  Full                                                        +---------+---------------+---------+-----------+----------+--------------+  Summary: RIGHT: - No evidence of deep vein thrombosis in the lower extremity. No indirect evidence of obstruction proximal to the inguinal ligament.  LEFT: - Findings appear essentially unchanged compared to previous examination performed yesterday.  - There is no evidence of deep vein thrombosis in the lower extremity.  - No cystic structure found in the popliteal fossa.  *See table(s) above for measurements and observations. Electronically signed by Monica Martinez MD on 03/26/2022 at 1:31:02 PM.    Final    VAS [place] LOWER EXTREMITY VENOUS (DVT)  Result Date: 03/24/2022  Lower Venous DVT  Study Patient Name:  ALEKSEY NEWBERN  Date of Exam:   03/24/2022 Medical Rec #: [id number]          Accession #:    [id number] Date of Birth: 1951/08/07          Patient Gender: M Patient Age:   62 years Exam Location:  [facility] Procedure:      VAS [place] LOWER EXTREMITY VENOUS (DVT) Referring Phys: CHI ELLISON --------------------------------------------------------------------------------  Indications: Swelling.  Risk Factors: None identified. Limitations: Poor ultrasound/tissue interface and patient positioning, patient pain tolerance. Comparison Study: No prior studies. Performing Technologist: Oliver Hum RVT  Examination Guidelines: A complete evaluation includes B-mode imaging, spectral Doppler, color Doppler, and power Doppler as needed of all accessible portions of each vessel. Bilateral testing is considered an integral part of a complete examination. Limited examinations for reoccurring indications may be performed as noted. The reflux portion of the exam is performed with the patient in reverse Trendelenburg.  +---------+---------------+---------+-----------+----------+--------------+ RIGHT    CompressibilityPhasicitySpontaneityPropertiesThrombus Aging +---------+---------------+---------+-----------+----------+--------------+ CFV      Full           Yes      Yes                                 +---------+---------------+---------+-----------+----------+--------------+ SFJ      Full                                                        +---------+---------------+---------+-----------+----------+--------------+ FV Prox  Full                                                        +---------+---------------+---------+-----------+----------+--------------+ FV Mid   Full                                                        +---------+---------------+---------+-----------+----------+--------------+ FV DistalFull                                                         +---------+---------------+---------+-----------+----------+--------------+ PFV      Full                                                        +---------+---------------+---------+-----------+----------+--------------+  POP      Full           Yes      Yes                                 +---------+---------------+---------+-----------+----------+--------------+ PTV      Full                                                        +---------+---------------+---------+-----------+----------+--------------+ PERO     Full                                                        +---------+---------------+---------+-----------+----------+--------------+   +---------+---------------+---------+-----------+----------+-------------------+ LEFT     CompressibilityPhasicitySpontaneityPropertiesThrombus Aging      +---------+---------------+---------+-----------+----------+-------------------+ CFV                     Yes      Yes                                      +---------+---------------+---------+-----------+----------+-------------------+ FV Prox                 Yes      Yes                                      +---------+---------------+---------+-----------+----------+-------------------+ FV Mid                  Yes      Yes                                      +---------+---------------+---------+-----------+----------+-------------------+ FV Distal               Yes      Yes                                      +---------+---------------+---------+-----------+----------+-------------------+ PFV                                                   Not well visualized +---------+---------------+---------+-----------+----------+-------------------+ POP      Full           Yes      Yes                                      +---------+---------------+---------+-----------+----------+-------------------+ PTV      Full                                                              +---------+---------------+---------+-----------+----------+-------------------+  PERO     Full                                                             +---------+---------------+---------+-----------+----------+-------------------+     Summary: RIGHT: - There is no evidence of deep vein thrombosis in the lower extremity. However, portions of this examination were limited- see technologist comments above.  - No cystic structure found in the popliteal fossa.  LEFT: - There is no evidence of deep vein thrombosis in the lower extremity. However, portions of this examination were limited- see technologist comments above.  - No cystic structure found in the popliteal fossa.  *See table(s) above for measurements and observations. Electronically signed by Orlie Pollen on 03/24/2022 at 9:19:55 PM.    Final    VAS [place] UPPER EXTREMITY VENOUS DUPLEX  Result Date: 03/24/2022 UPPER VENOUS STUDY  Patient Name:  HASSAN BLACKSHIRE  Date of Exam:   03/24/2022 Medical Rec #: [id number]          Accession #:    [id number] Date of Birth: [birth year]          Patient Gender: M Patient Age:   40 years Exam Location:  [facility] [place] Procedure:      VAS [place] UPPER EXTREMITY VENOUS DUPLEX Referring Phys: CHI ELLISON --------------------------------------------------------------------------------  Indications: Swelling Risk Factors: None identified. Limitations: Poor ultrasound/tissue interface, bandages, line and patient positioning, patient pain tolerance. Comparison Study: No prior studies. Performing Technologist: Oliver Hum RVT  Examination Guidelines: A complete evaluation includes B-mode imaging, spectral Doppler, color Doppler, and power Doppler as needed of all accessible portions of each vessel. Bilateral testing is considered an integral part of a complete examination. Limited examinations for reoccurring indications may be performed as noted.  Right Findings:  +----------+------------+---------+-----------+----------+-------+ RIGHT     CompressiblePhasicitySpontaneousPropertiesSummary +----------+------------+---------+-----------+----------+-------+ IJV           Full       Yes       Yes                      +----------+------------+---------+-----------+----------+-------+ Subclavian    Full       Yes       Yes                      +----------+------------+---------+-----------+----------+-------+ Axillary      Full       Yes       Yes                      +----------+------------+---------+-----------+----------+-------+ Brachial      Full       Yes       Yes                      +----------+------------+---------+-----------+----------+-------+ Radial        Full                                          +----------+------------+---------+-----------+----------+-------+ Ulnar         Full                                          +----------+------------+---------+-----------+----------+-------+  Cephalic      Full                                          +----------+------------+---------+-----------+----------+-------+ Basilic       Full                                          +----------+------------+---------+-----------+----------+-------+  Left Findings: +----------+------------+---------+-----------+----------+-------+ LEFT      CompressiblePhasicitySpontaneousPropertiesSummary +----------+------------+---------+-----------+----------+-------+ IJV           Full       Yes       Yes                      +----------+------------+---------+-----------+----------+-------+ Subclavian    Full       Yes       Yes                      +----------+------------+---------+-----------+----------+-------+ Axillary      Full       Yes       Yes                      +----------+------------+---------+-----------+----------+-------+ Brachial      Full       Yes       Yes                       +----------+------------+---------+-----------+----------+-------+ Radial        Full                                          +----------+------------+---------+-----------+----------+-------+ Ulnar         Full                                          +----------+------------+---------+-----------+----------+-------+ Cephalic      None                                   Acute  +----------+------------+---------+-----------+----------+-------+ Basilic       Full                                          +----------+------------+---------+-----------+----------+-------+  Summary:  Right: No evidence of deep vein thrombosis in the upper extremity. No evidence of superficial vein thrombosis in the upper extremity.  Left: No evidence of deep vein thrombosis in the upper extremity. Findings consistent with acute superficial vein thrombosis involving the left cephalic vein.  *See table(s) above for measurements and observations.  Diagnosing physician: Orlie Pollen Electronically signed by Orlie Pollen on 03/24/2022 at 9:16:30 PM.    Final    [place] EKG SITE RITE  Result Date: 04/07/2022 If Site Rite image not attached, placement could not be confirmed due to current cardiac rhythm.  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 EXAM: Ultrasound-guided aspiration of left lower extremity fluid collection MEDICATIONS: The patient is currently admitted to the hospital and receiving intravenous antibiotics. The antibiotics were administered within an appropriate time frame prior to the initiation of the procedure. ANESTHESIA/SEDATION: None. COMPLICATIONS: None immediate. PROCEDURE: Informed written consent was obtained from the patient after a thorough discussion of  the procedural risks, benefits and alternatives. All questions were addressed. Maximal Sterile Barrier Technique was utilized including caps, mask, sterile gowns, sterile gloves, sterile drape, hand hygiene and skin antiseptic. A timeout was performed prior to the initiation of the procedure. Preprocedure ultrasound evaluation of the left lower extremity was performed. Within the intramuscular aspect of the posterior left thigh in the mid to distal portions are phlegmonous changes with a few interspersed simple appearing fluid collections. The procedure was planned. The left lower extremity is prepped and draped in standard fashion. Subdermal Local anesthesia was administered at the planned needle entry site with 1% lidocaine. Under direct ultrasound visualization, a 5 [place], 7 cm Yueh catheter was directed into the fluid collection. Aspiration was performed which yielded approximately 10 mL of purulent fluid. Samples were sent for culture. The needle was removed. Postprocedure ultrasound of it patient demonstrated no evidence of hematoma or other complicating features. A bandage was applied. The patient tolerated the procedure well was transferred back to the ICU in stable condition. IMPRESSION: Technically successful ultrasound-guided aspiration of left posterior thigh intramuscular abscess yielding approximately 10 mL of purulent fluid. Ruthann Cancer, MD [facility] Radiology Electronically Signed   By: Ruthann Cancer M.D.   On: 03/31/2022 08:07    Labs:  CBC: Recent Labs    04/12/22 0644 04/15/22 0936 04/18/22 0925 04/20/22 0436  WBC 6.3 9.4 15.0* 10.8*  HGB 8.0* 8.1* 8.2* 7.5*  HCT 23.8* 24.3* 25.2* 23.5*  PLT 310 304 347 327    COAGS: Recent Labs    03/18/22 0617 04/12/22 0644  INR 1.4* 1.2    BMP: Recent Labs    03/23/22 0742 03/23/22 0904 04/04/22 2333 04/09/22 1925 04/10/22 0250 04/12/22 0644 04/13/22 0420 04/16/22 0434  NA  QUESTIONABLE RESULTS, RECOMMEND RECOLLECT TO VERIFY   < > 134*  --  133* 132* 135  --   K QUESTIONABLE RESULTS, RECOMMEND RECOLLECT TO VERIFY   < > 3.6  --  3.4* 3.4* 4.0  --   CL QUESTIONABLE RESULTS, RECOMMEND RECOLLECT TO VERIFY   < > 107  --  100 99 103  --   CO2 QUESTIONABLE RESULTS, RECOMMEND RECOLLECT TO VERIFY   < > 21*  --  '24 25 26  '$ --   GLUCOSE QUESTIONABLE RESULTS, RECOMMEND RECOLLECT TO VERIFY   < > 113*  --  101* 98 105*  --   BUN QUESTIONABLE RESULTS, RECOMMEND RECOLLECT TO VERIFY   < > 24*  --  '12 8 10  '$ --   CALCIUM QUESTIONABLE RESULTS, RECOMMEND RECOLLECT TO VERIFY   < > 7.7*  --  8.3* 8.2* 8.4*  --   CREATININE QUESTIONABLE RESULTS, RECOMMEND RECOLLECT TO VERIFY   < > 0.91   < > 0.92 1.00 1.00 0.93  GFRNONAA QUESTIONABLE RESULTS, RECOMMEND RECOLLECT TO VERIFY   < > >60   < > >60 >60 >60 >60  GFRAA QUESTIONABLE RESULTS, RECOMMEND RECOLLECT TO VERIFY  --   --   --   --   --   --   --    < > =  values in this interval not displayed.    LIVER FUNCTION TESTS: Recent Labs    04/04/22 2333 04/08/22 0502 04/12/22 0644 04/13/22 0420  BILITOT 0.8 0.8 0.4 0.5  AST 51* '26 20 28  '$ ALT 55* 33 28 36  ALKPHOS 436* 357* 309* 287*  PROT 6.3* 6.6 6.3* 6.3*  ALBUMIN 1.9* 2.0* 1.9* 1.9*    TUMOR MARKERS: No results for input(s): AFPTM, CEA, CA199, CHROMGRNA in the last 8760 hours.  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 Sanna, 72 year old male, is tentatively scheduled today for an image-guided intra-abdominal fluid collection aspiration with possible drain placement.   Risks and benefits discussed with the patient including bleeding, infection, damage to adjacent structures, bowel perforation/fistula connection, and sepsis.  All of the patient's questions were answered, patient is agreeable to proceed. He has been NPO. Last dose of lovenox was 04/19/22 at  [place].   Consent signed and in chart.  Thank you for this interesting consult.  I greatly enjoyed meeting JESSY CYBULSKI and look forward to participating in their care.  A copy of this report was sent to the requesting provider on this date.  Electronically Signed: Soyla Dryer, AGACNP-BC [phone] 04/20/2022, 7:53 AM   I spent a total of 20 Minutes    in face to face in clinical consultation, greater than 50% of which was counseling/coordinating care for intra-abdominal fluid collection aspiration with possible drain placement.

## 2022-04-20 NOTE — Progress Notes (Signed)
Physical Therapy Note  Patient Details  Name: Tyler [name] MRN: [id number] Date of Birth: 26-Mar-1951 Today's Date: 04/20/2022    Pt missed 45 min of skilled PT due to transport arriving to take patient off unit for medical procedure today.   Discussed overall plan with resuming therapies either later today or tomorrow per medical clearance. Also discussed with him and partner, Tyler [name], that do not anticipate missing this session(s) today, would require his LOS to extend unless there were medical reasons to extend as pt concerned about this. Pt still making excellent progress with mobility goals in PT and planning for partner to participate in family education Wed and/or Thurs in preparation for currently planned d/c 6/9.   Will follow up and resume POC as able.  Canary Brim Ivory Broad, PT, DPT, CBIS  04/20/2022, 11:49 AM

## 2022-04-20 NOTE — Progress Notes (Signed)
PROGRESS NOTE   Subjective/Complaints: Pt and partner anxious this AM to see surgery and ID to discuss plan.  They were anxious and upset because they thought he was being scheduled for surgery today, discussed that he is not scheduled for surgery at this time, however as ID discussed with him yesterday, he does have a surgical consultation scheduled for today.     Review of Systems  Review of Systems  Respiratory:  Negative for shortness of breath.   Cardiovascular:  Negative for chest pain and palpitations.  Gastrointestinal:  Negative for abdominal pain, nausea and vomiting.  Neurological:  Positive for weakness.    Objective:   CT ABDOMEN PELVIS W CONTRAST  Result Date: 04/18/2022 CLINICAL DATA:  Follow-up intra-abdominal abscess. EXAM: CT ABDOMEN AND PELVIS WITH CONTRAST TECHNIQUE: Multidetector CT imaging of the abdomen and pelvis was performed using the standard protocol following bolus administration of intravenous contrast. RADIATION DOSE REDUCTION: This exam was performed according to the departmental dose-optimization program which includes automated exposure control, adjustment of the mA and/or kV according to patient size and/or use of iterative reconstruction technique. CONTRAST:  158m OMNIPAQUE IOHEXOL 300 MG/ML  SOLN COMPARISON:  Most recent CT 04/10/2022 FINDINGS: Lower chest: Decreased size of left pleural effusion with only trace residual. Associated atelectasis. Hepatobiliary: Punctate hepatic granuloma. No suspicious liver lesion. Gallbladder physiologically distended, no calcified stone. No biliary dilatation. Pancreas: No ductal dilatation or inflammation. Spleen: Mildly enlarged spanning 14.2 cm cranial caudal. No focal splenic abnormality. Small amount of perisplenic fluid adjacent to the inferior pole. Adrenals/Urinary Tract: No adrenal nodule. Previous anterior perirenal fluid collection no abuts the upper pole of  the left kidney. There is no hydronephrosis. Small focus of air in the urinary bladder is likely related to prior instrumentation. No bladder wall thickening. Stomach/Bowel: Decompressed stomach. Few prominent fluid-filled loops of small bowel in the upper abdomen, no obstruction. Enteric contrast reaches the colon. Left lower quadrant colostomy. Stapled off sigmoid colon. Vascular/Lymphatic: Aortic atherosclerosis. Patent portal, splenic, and superior mesenteric veins. No bulky abdominopelvic adenopathy. Reproductive: Unremarkable. Other: Drainage catheter in the pelvis is looped within a presacral air-fluid fluid collection. This measures approximately 8.4 x 3.4 cm, series 3, image 76, previously 7.9 x 2.5 cm. There is an increased fluid component compared to prior exam. U shaped collections emanating anteriorly from this dominant presacral collection persist, coursing lateral to the bladder, series 3, image 73. These may encircle the bladder and communicate anteriorly, uncertain. These collections measure approximately 1 cm in thickness. The left retroperitoneal collection has slightly diminished, currently 3.1 x 1.4 cm, series 3, image 58, previously 4.7 x 1.6 cm. Left anterior perirenal collection persists, diminished in size currently 2.3 x 1.6 cm, series 3, image 36, previously 3.2 x 2.3 cm. The previous potential connection between these 2 fluid collections has diminished. Surgical drain located within the left anterior abdominal wall musculature has no associated fluid collection. Musculoskeletal: Intramuscular collection within the left obturator musculature measures 3 x 1.6 cm, series 3, image 87, previously 3.2 x 1.8 cm. Stable osseous structures. Stable right thigh intramuscular lipoma IMPRESSION: 1. Drainage catheter in the pelvis is looped within a presacral air-fluid collection. This measures approximately  8.4 x 3.4 cm, previously 7.9 x 2.5 cm. There is an increased fluid component compared to prior  exam. U shaped collections emanating anteriorly from this dominant presacral collection coursing lateral to the bladder, 1 cm in thickness. These may encircle the bladder and communicate anteriorly, uncertain. 2. The left retroperitoneal collections have slightly diminished in size, lower quadrant collection currently measures 3.1 x 1.4 cm, anterior pararenal collection 2.3 x 1.6 cm. The previous potential connection between these 2 fluid collections has diminished. 3. Intramuscular collection within the left obturator musculature measures 3 x 1.6 cm, previously 3.2 x 1.8 cm. 4. Decreased size of left pleural effusion with only trace residual. 5. Mild splenomegaly. Aortic Atherosclerosis (ICD10-I70.0). Electronically Signed   By: Keith Rake M.D.   On: 04/18/2022 20:57   CT FEMUR LEFT W CONTRAST  Result Date: 04/18/2022 CLINICAL DATA:  Soft tissue infection suspected, thigh, xray done EXAM: CT OF THE LOWER LEFT EXTREMITY WITH CONTRAST TECHNIQUE: Multidetector CT imaging of the lower left extremity was performed according to the standard protocol following intravenous contrast administration. RADIATION DOSE REDUCTION: This exam was performed according to the departmental dose-optimization program which includes automated exposure control, adjustment of the mA and/or kV according to patient size and/or use of iterative reconstruction technique. CONTRAST:  151m OMNIPAQUE IOHEXOL 300 MG/ML  SOLN COMPARISON:  Limited MRI 03/29/2022, included portions from prior abdominopelvic CTs. FINDINGS: Bones/Joint/Cartilage No fracture. No periosteal reaction, bone destruction, or erosions. Hip and knee alignment are maintained. Ligaments Suboptimally assessed by CT. Muscles and Tendons Left obturator fluid collection measures approximately 3.1 x 1.4 cm slightly diminished from prior abdominal CT 04/10/2022. There are multiple thin elongated fluid collections within left abductor muscle. Proximally this is seen on series 1,  image 42 and series 6, image 25, spanning approximately 5.5 cm in length, subcentimeter in thickness. In the midportion this is seen on image 161 and series 6, image 27, spanning 4.6 cm in length. Distal collection measures 1.6 x 1.1 x 4.6 cm, series 1, image 74 and series 6, image 24, this collection is peripherally enhancing and thick walled. Soft tissues Mild soft tissue edema.  No subcutaneous collection. IMPRESSION: 1. Multiple thin elongated fluid collections within the left abductor muscle, suspicious for abscesses. The largest of these measures 1.6 x 1.1 x 4.6 cm distally, thick-walled and peripherally enhancing. 2. Left obturator fluid collection measures 3.1 x 1.4 cm, slightly diminished from prior abdominal CT 04/10/2022. Electronically Signed   By: MKeith RakeM.D.   On: 04/18/2022 21:03   Recent Labs    04/18/22 0925 04/20/22 0436  WBC 15.0* 10.8*  HGB 8.2* 7.5*  HCT 25.2* 23.5*  PLT 347 327    No results for input(s): NA, K, CL, CO2, GLUCOSE, BUN, CREATININE, CALCIUM in the last 72 hours.   Intake/Output Summary (Last 24 hours) at 04/20/2022 0759 Last data filed at 04/20/2022 0730 Gross per 24 hour  Intake 780 ml  Output 1670 ml  Net -890 ml      Pressure Injury 04/09/22 Heel Left Deep Tissue Pressure Injury - Purple or maroon localized area of discolored intact skin or blood-filled blister due to damage of underlying soft tissue from pressure and/or shear. (Active)  04/09/22 1315  Location: Heel  Location Orientation: Left  Staging: Deep Tissue Pressure Injury - Purple or maroon localized area of discolored intact skin or blood-filled blister due to damage of underlying soft tissue from pressure and/or shear.  Wound Description (Comments):   Present on Admission: Yes  Pressure Injury 04/09/22 Heel Right Deep Tissue Pressure Injury - Purple or maroon localized area of discolored intact skin or blood-filled blister due to damage of underlying soft tissue from pressure  and/or shear. (Active)  04/09/22 1315  Location: Heel  Location Orientation: Right  Staging: Deep Tissue Pressure Injury - Purple or maroon localized area of discolored intact skin or blood-filled blister due to damage of underlying soft tissue from pressure and/or shear.  Wound Description (Comments):   Present on Admission: Yes    Physical Exam: Vital Signs Blood pressure 123/89, pulse 75, temperature 97.8 F (36.6 C), temperature source Oral, resp. rate 18, height '5\' 9"'$  (1.753 m), weight 75.1 kg, SpO2 98 %.  General: Alert and oriented x3. NAD, in bed HEENT: Head, normocephalic, atraumatic, PERRLA, EOMI, sclera aniecteric, oral mucosa pink and moist Neck: supple Chest: normal rate, no increased WOB Abdomen: soft, positive bowel sounds, minimal tenderness,  Colostomy viable with stool present. Comments: Pelvis has JP drains x2 with milky looking drainage Skin: Stoma around colostomy intact.  Buttocks have left wound covered with dressing. Extremities: No clubbing, edema, or cyanosis.  Neuro: Alert and oriented x3, CN II-XII intact, no focal deficits. Psych: anxious  Assessment/Plan: 1. Functional deficits which require 3+ hours per day of interdisciplinary therapy in a comprehensive inpatient rehab setting. Physiatrist is providing close team supervision and 24 hour management of active medical problems listed below. Physiatrist and rehab team continue to assess barriers to discharge/monitor patient progress toward functional and medical goals  Care Tool:  Bathing    Body parts bathed by patient: Right arm, Left arm, Chest, Abdomen, Face, Front perineal area, Right upper leg, Left upper leg, Right lower leg, Left lower leg, Buttocks   Body parts bathed by helper: Buttocks, Right lower leg, Left lower leg     Bathing assist Assist Level: Supervision/Verbal cueing     Upper Body Dressing/Undressing Upper body dressing   What is the patient wearing?: Pull over shirt     Upper body assist Assist Level: Independent    Lower Body Dressing/Undressing Lower body dressing      What is the patient wearing?: Incontinence brief     Lower body assist Assist for lower body dressing: Supervision/Verbal cueing     Toileting Toileting    Toileting assist Assist for toileting: Minimal Assistance - Patient > 75% (urinal use- has colostomy with total A for care)     Transfers Chair/bed transfer  Transfers assist     Chair/bed transfer assist level: Supervision/Verbal cueing     Locomotion Ambulation   Ambulation assist      Assist level: Contact Guard/Touching assist Assistive device: Walker-rolling Max distance: 150'   Walk 10 feet activity   Assist     Assist level: Supervision/Verbal cueing Assistive device: Walker-rolling   Walk 50 feet activity   Assist Walk 50 feet with 2 turns activity did not occur: Safety/medical concerns  Assist level: Supervision/Verbal cueing Assistive device: Walker-rolling    Walk 150 feet activity   Assist Walk 150 feet activity did not occur: Safety/medical concerns  Assist level: Contact Guard/Touching assist Assistive device: Walker-rolling    Walk 10 feet on uneven surface  activity   Assist Walk 10 feet on uneven surfaces activity did not occur: Safety/medical concerns         Wheelchair     Assist Is the patient using a wheelchair?: Yes Type of Wheelchair: Manual    Wheelchair assist level: Supervision/Verbal cueing Max wheelchair distance: 150  Wheelchair 50 feet with 2 turns activity    Assist        Assist Level: Supervision/Verbal cueing   Wheelchair 150 feet activity     Assist      Assist Level: Supervision/Verbal cueing   Blood pressure 123/89, pulse 75, temperature 97.8 F (36.6 C), temperature source Oral, resp. rate 18, height '5\' 9"'$  (1.753 m), weight 75.1 kg, SpO2 98 %.   Medical Problem List and Plan: 1. Functional deficits  secondary to debility secondary to pelvic abscess.  Status post laparoscopy, abdominal washout drain placement with creation of diverting colostomy 03/20/2022             -patient may shower but incisions/drains should be covered             -ELOS/Goals: 10-14 days modI             -Continue PT/OT             -Expected discharge 04/23/22  -Possible IR procedure, NPO today 2.  Antithrombotics: -DVT/anticoagulation:  Pharmaceutical: Lovenox.  Venous Doppler negative             -antiplatelet therapy: N/A 5/27 Continue Lovenox 3. Pain Management: Robaxin 1000 mg 3 times daily, tramadol as needed 5/31- Pain continues to be controlled, continue PRN tramadol 6/1 Discussed use of pain meds when needed to stay ahead of pain for therapy 6/5 Not using PRN pain medications, he reports pain is well controlled 4. Mood: Ativan as needed as well as trazodone             -antipsychotic agents: N/A 5. Neuropsych: This patient is capable of making decisions on his own behalf. 6. Skin/Wound Care: Routine skin checks with colostomy education 7. Fluids/Electrolytes/Nutrition: Routine in and outs with follow-up chemistries 5/27 Slightly low sodium (133) and potassium (3.4), continue to trend, consider repleting 5/30 Na up to 135 and K wnl after repletion 6/4 No issues with electrolytes today    Latest Ref Rng & Units 04/16/2022    4:34 AM 04/13/2022    4:20 AM 04/12/2022    6:44 AM  BMP  Glucose 70 - 99 mg/dL  105   98    BUN 8 - 23 mg/dL  10   8    Creatinine 0.61 - 1.24 mg/dL 0.93   1.00   1.00    Sodium 135 - 145 mmol/L  135   132    Potassium 3.5 - 5.1 mmol/L  4.0   3.4    Chloride 98 - 111 mmol/L  103   99    CO2 22 - 32 mmol/L  26   25    Calcium 8.9 - 10.3 mg/dL  8.4   8.2      8.  ID/intra-abdominal infection.  Continue micafungin 100 mg daily as well as Zosyn 3.375 g every 8 hours.  Plan is for 6 weeks starting date 5/16 ending 05/11/2022 and will need repeat CT abdomen/pelvis on left thigh in 1 to 2  weeks -5/27 Pt has event last night on 5/26 of temp 101.6, HR 115, new onset cough, with shortness of breath.  Per night nurse on 5/26-5/27 became diaphoretic around 0000 on 5/27 and drenched the bed with sweat, with no subsequent fevers or episodes of tachycardia. Orders were placed for CBC, BMET, blood cultures x2, CXR, COVID, respiratory panel, and repeat CT abd/pelvis. ID was consulted the evening of 5/26 with recommendation given to continue current regimen of 6 week Zosyn and micafungin  based on original plan.  Per [place], CT abdomen/pelvis indicated with febrile event, especially in light that patient's last CT scan was 04/05/22 and he inadvertently lost one of his JP drains on the morning of 04/06/22. -Respiratory panel and COVID negative.  CXR shows Small left pleural effusion with atelectasis at the left lung base. -CT abd/pelvis: demonstrates a complex left retroperitoneal fluid collection, with superior and inferior components that appear to communicate overlying the Gerota's fascia and left psoas musculature. ID to round on pt on 04/11/22 and will decide next steps. 04/11/22 See ID and Radiology note.  IR was consulted for intra-abdominal fluid collection. Dr. Denna Haggard (IR) has approved patient for aspiration of inferior portion of fluid collection. -Per Radiology note, PA will reach out to General Surgery (Dr. Marcello Moores) to notify of plan. Also, called Dr. Rosendo Gros, MD on-call for General Surgery this weekend to update office on patient since surgery signed off on 04/09/22.  I was unable to reach provider since he was in-procedure.  But relayed message by telephone to nurse, Karena Addison, [place] of patient's febrile episode 04/09/22-04/10/22 and plans for abdominal fluid removal and possible drain placement by IR. 5/29 IR completed aspiration of 13m of purulent material, no drain placed 5/29 ID continuing Zosyn, micafungin 5/31 culture of L flank abscess with GNR and gram pos cocci 6/1 ID changing zosyn to  Ceftriaxone and Augmentin, continuing Micafungin, continue to follow 6/2 no fevers overnight, follow 6/5 new fluid collections, ID changed to imipenem, general surgery consult 9.  Postoperative anemia.  Continue iron supplement.  Follow-up CBC 5/28 Hgb 8.2 low but stable, continue to trend and monitor. 5/29 Hgb stable at 8.0, follow 6/6 HGB a little lower today at 7.5, continue to monitor    Latest Ref Rng & Units 04/20/2022    4:36 AM 04/18/2022    9:25 AM 04/15/2022    9:36 AM  CBC  WBC 4.0 - 10.5 K/uL 10.8   15.0   9.4    Hemoglobin 13.0 - 17.0 g/dL 7.5   8.2   8.1    Hematocrit 39.0 - 52.0 % 23.5   25.2   24.3    Platelets 150 - 400 K/uL 327   347   304     10.  Hypertension.  Continue Zebeta 10 mg nightly,   Monitor with increased mobility 5/20 BP well controlled, continue to monitor, continue zebeta 6/1 Well controlled, follow 6/6 BP well controlled, continue to follow    04/20/2022    2:30 AM 04/19/2022    7:43 PM 04/19/2022    3:06 PM  Vitals with BMI  Systolic [id number] Diastolic 89 81 78  Pulse 75 110 95   11.  Decreased nutritional storage.  Dietary follow-up. Continue Prosource.  12.  Urinary retention.  Foley tube removed and urinating.  Check PVR 5/27 Pt is voiding okay, reported that he had some urinary incontinence for the first day or two immediately following Foley removal, which has since resolved. 5/31 reports no difficulty voiding, follow  13. Hypokalemia             -5/29KCL today, repeat labs             -5/30 K+ wnl now             6/3 K+, remains wnl's, 4.0, on 5/30 14. Hypoalbuminemia             -Continue ensure supplementation             -  Discussed healthy diet, He ate all his breakfast today 5/30 15. Wound at lower buttocks site of previous drain             -5/20 WOC nurse consult recommends aquacel Ag with dry gauze and silicone foam. Continue to follow  -6/6 WOC noted increased R glueal wound drainage yesterday 6/3 Patient reports increased volume  of greenish discharge from left buttock wound saturating dressing.  Wound bed appears pinkish red length 3 cm, width 1.5 cm, depth not measured per patient request but appears about 1.5-2 cm.  WBC's normal, afebrile.  Called ID to make awake, spoke with on-call MD Laurice Record, MD.  Michela Pitcher that ID will round and follow-up on 04/18/22. 6/4 Wound site still is producing greenish drainage, ID was notified yesterday and again today with WBC spike of 15.0. ID to round on and follow up on patient. -6/5 CT abdomen/pelv and left femur completed.  May need additional aspiration/drain. ID has seen him and recommended surgical eval. I called surgery christopher white for eval, appreciate assistance -6/6 Surgery ordered IR evaluation, appreciate assistance 16. Asthma,              -Using albuterol inhaler, monitor, improved today             -6/1 Reports breathing continues to be improved              6/4 No reported issues today. 17. Leukocytosis  -6/5 ID adjusted abx to primaxin, continue to follow  -6/6 WBC down to 10.8 today  LOS: 11 days A FACE TO FACE EVALUATION WAS PERFORMED  Jennye Boroughs 04/20/2022, 7:59 AM

## 2022-04-20 NOTE — Progress Notes (Signed)
[facility]  Date of Admission:  04/09/2022     Total days of antibiotics 42         ASSESSMENT:  Mr. Handler is planned for IR evaluation of drains to optimize source control. General Surgery with no indications for surgical intervention. Appears to be tolerating antibiotics with no adverse side effects. Plan of care discussed with Mr. Asato. Continue current dose of imipenem and micafungin. Request any additional cultures that can be obtained by IR. Wound care per Wound RN and General Surgery recommendations.  Optimize nutrition for healing. Therapeutic drug monitoring of LFT and renal function. Remaining medical and supportive care per primary team.   PLAN:  Continue imipenem and micafungin.  IR drain evaluation and management. Cultures as able. Wound care per General Surgery and Wound RN. Optimize nutrition. Therapeutic drug monitoring of LFTs and renal function.  Remaining medical and supportive care per primary team.   Principal Problem:   Abscess, intra-abdominal, postoperative Active Problems:   Debility   Sepsis due to Enterococcus ([place])   E coli infection   Candida albicans infection   Leukocytosis   Pain in joint involving pelvic region and thigh    vitamin C  500 mg Oral BID   bisoprolol  10 mg Oral QHS   Chlorhexidine Gluconate Cloth  6 each Topical Daily   enoxaparin (LOVENOX) injection  40 mg Subcutaneous Q24H   famotidine  20 mg Oral BID   feeding supplement  237 mL Oral TID BM   ferrous sulfate  325 mg Oral BID WC   lidocaine       lip balm  1 application. Topical BID   loperamide  2 mg Oral TID with meals   methocarbamol  1,000 mg Oral TID   montelukast  10 mg Oral QHS   multivitamin with minerals  1 tablet Oral Daily   pantoprazole  40 mg Oral Daily   polycarbophil  1,250 mg Oral BID   sodium chloride flush  10-40 mL Intracatheter Q12H    SUBJECTIVE:  Afebrile overnight with no acute events. Increased levels of  anxiety related to plan of care and miscommunication.   Allergies  Allergen Reactions   Fluconazole Hives, Rash and Other (See Comments)    Shingles activated    Griseofulvin Anaphylaxis, Swelling, Rash and Other (See Comments)    Throat swelling     Sulfa Antibiotics Other (See Comments)    Joints ache, swell and caused inflammation    Oxycodone Nausea Only and Other (See Comments)    Nauseous to the point of almost vomiting   Mometasone Furo-Formoterol Fum Other (See Comments)    Lack of therapeutic effect    Retapamulin Rash     Review of Systems: Review of Systems  Constitutional:  Negative for chills, fever and weight loss.  Respiratory:  Negative for cough, shortness of breath and wheezing.   Cardiovascular:  Negative for chest pain and leg swelling.  Gastrointestinal:  Negative for abdominal pain, constipation, diarrhea, nausea and vomiting.  Skin:  Negative for rash.  Psychiatric/Behavioral:  The patient is nervous/anxious.      OBJECTIVE: Vitals:   04/19/22 0513 04/19/22 1506 04/19/22 1943 04/20/22 0230  BP: 134/81 124/78 121/81 123/89  Pulse: 93 95 (!) 110 75  Resp: '18 18 17 18  '$ Temp: 99 F (37.2 C) 99.5 F (37.5 C) 100 F (37.8 C) 97.8 F (36.6 C)  TempSrc:  Oral  Oral  SpO2: 95% 100% 98% 98%  Weight:      Height:       Body mass index is 24.45 kg/m.  Physical Exam Constitutional:      General: He is not in acute distress.    Appearance: He is well-developed.  Cardiovascular:     Rate and Rhythm: Normal rate and regular rhythm.     Heart sounds: Normal heart sounds.  Pulmonary:     Effort: Pulmonary effort is normal.     Breath sounds: Normal breath sounds.  Musculoskeletal:     Comments: JP drains in place with milky drainage.   Skin:    General: Skin is warm and dry.  Neurological:     Mental Status: He is alert and oriented to person, place, and time.  Psychiatric:        Mood and Affect: Mood normal.    Lab Results Lab Results   Component Value Date   WBC 10.8 (H) 04/20/2022   HGB 7.5 (L) 04/20/2022   HCT 23.5 (L) 04/20/2022   MCV 94.0 04/20/2022   PLT 327 04/20/2022    Lab Results  Component Value Date   CREATININE 0.93 04/16/2022   BUN 10 04/13/2022   NA 135 04/13/2022   K 4.0 04/13/2022   CL 103 04/13/2022   CO2 26 04/13/2022    Lab Results  Component Value Date   ALT 36 04/13/2022   AST 28 04/13/2022   ALKPHOS 287 (H) 04/13/2022   BILITOT 0.5 04/13/2022     Microbiology: Recent Results (from the past 240 hour(s))  Aerobic/Anaerobic Culture w Gram Stain (surgical/deep wound)     Status: None   Collection Time: 04/12/22 10:12 AM   Specimen: Abscess  Result Value Ref Range Status   Specimen Description ABSCESS  Final   Special Requests ABSC LEFT FLANK  Final   Gram Stain   Final    ABUNDANT WBC PRESENT, PREDOMINANTLY PMN RARE GRAM NEGATIVE RODS RARE GRAM POSITIVE COCCI    Culture   Final    MODERATE ESCHERICHIA COLI NO ANAEROBES ISOLATED Performed at [facility], [place] [address] [place], [place] [address]    Report Status 04/17/2022 FINAL  Final   Organism ID, Bacteria ESCHERICHIA COLI  Final      Susceptibility   Escherichia coli - MIC*    AMPICILLIN >=32 RESISTANT Resistant     CEFAZOLIN >=64 RESISTANT Resistant     CEFEPIME 0.25 SENSITIVE Sensitive     CEFTAZIDIME <=1 SENSITIVE Sensitive     CEFTRIAXONE <=0.25 SENSITIVE Sensitive     CIPROFLOXACIN <=0.25 SENSITIVE Sensitive     GENTAMICIN <=1 SENSITIVE Sensitive     IMIPENEM <=0.25 SENSITIVE Sensitive     TRIMETH/SULFA <=20 SENSITIVE Sensitive     AMPICILLIN/SULBACTAM >=32 RESISTANT Resistant     PIP/TAZO >=128 RESISTANT Resistant     * MODERATE ESCHERICHIA COLI     Terri Piedra, NP [facility]  04/20/2022  12:42 PM

## 2022-04-20 NOTE — Plan of Care (Signed)
  Problem: RH Wheelchair Mobility Goal: LTG Patient will propel w/c in controlled environment (PT) Description: LTG: Patient will propel wheelchair in controlled environment, # of feet with assist (PT) Outcome: Not Applicable Flowsheets (Taken 04/20/2022 0745) LTG: Pt will propel w/c in controlled environ  assist needed:: (DC) -- Note: D/C primary ambulator Goal: LTG Patient will propel w/c in home environment (PT) Description: LTG: Patient will propel wheelchair in home environment, # of feet with assistance (PT). Outcome: Not Applicable Flowsheets (Taken 04/20/2022 0745) LTG: Pt will propel w/c in home environ  assist needed:: (DC) -- Note: D/C primary ambulator

## 2022-04-20 NOTE — Procedures (Signed)
Interventional Radiology Procedure Note  Procedure: CT guided presacral drain placement  Findings: Please refer to procedural dictation for full description. 12 Fr presacral drain with aspiration of approximately 30 mL purulent fluid.  Sample sent for culture.  Complications: None immediate  Estimated Blood Loss: < 5 mL  Recommendations: Keep to bulb suction. IR will follow.   Ruthann Cancer, MD

## 2022-04-20 NOTE — Progress Notes (Signed)
Occupational Therapy Session Note  Patient Details  Name: Tyler [name] MRN: [id number] Date of Birth: 10-31-1951  Today's Date: 04/20/2022 OT Individual Time: 0730-0750 OT Individual Time Calculation (min): 20 min    Short Term Goals: Week 2:  OT Short Term Goal 1 (Week 2): STGs = LTGs  Skilled Therapeutic Interventions/Progress Updates:    Patient scheduled for 30 min session this am.  Patient very frustrated as he has been told he is scheduled for surgery, but has not yet spoken to surgeons.  Patient with IV actively running, and NPO since last night.  Patient declined getting out of bed, washing hair, unable to clean teeth.  Listened supportively to allow patient to express concerns.  Highlighted all his progress thus far, and reinforced benefit of continuing to work to sustain/improve range of motion and strength by daily exercise.  Patient very pleasant, yet slightly apprehensive regarding upcoming procedure.    Therapy Documentation Precautions:  Precautions Precautions: Fall Precaution Comments: drains, colostomy, rectal drainage Restrictions Weight Bearing Restrictions: No General: General OT Amount of Missed Time: 10 Minutes Pain:  No pain    Therapy/Group: Individual Therapy  Mariah Milling 04/20/2022, 7:56 AM

## 2022-04-20 NOTE — Progress Notes (Signed)
Occupational Therapy Session Note  Patient Details  Name: Tyler [name] MRN: [id number] Date of Birth: [birth year]  Today's Date: 04/20/2022 OT Individual Time: 4098-1191 OT Individual Time Calculation (min): 30 min  (Missed 15 min due to MD visit)  Short Term Goals: Week 1:  OT Short Term Goal 1 (Week 1): Pt will complete grooming sink side level standing up to 4 minute intervals with brief intermittent rests seated as needed with close S OT Short Term Goal 1 - Progress (Week 1): Met OT Short Term Goal 2 (Week 1): Pt will complete toilet and shower bench transfers with CGA using grab bars OT Short Term Goal 2 - Progress (Week 1): Met (have just worked on toilet transfers) OT Short Term Goal 3 (Week 1): Pt will complete UB bathing and dressing with close S after set up OT Short Term Goal 3 - Progress (Week 1): Met OT Short Term Goal 4 (Week 1): Pt will complete LB bathing and dressing using AE as needed with min A OT Short Term Goal 4 - Progress (Week 1): Met OT Short Term Goal 5 (Week 1): Pt will verbally identify, express and employ 3/3 energy conservation strategies during ADL's, functional mobility and light UE ther ex//act with min VC's. OT Short Term Goal 5 - Progress (Week 1): Met Week 2:  OT Short Term Goal 1 (Week 2): STGs = LTGs  Skilled Therapeutic Interventions/Progress Updates:     Pt received in bed waiting for MDs to arrive to talk to him about upcoming procedures for which he has been NPO in prep.  Pt agreeable to working on LB dressing from EOB. Sat to EOB and now able to get shorts on over feet without A.  CGA standing to get over hips as he was standing in bare feet.  Able to cross R foot to don sock and shoe.  Starting to be able to cross L foot but hip still a bit stiff. Pt started to sit at angle on bed to prop L leg on bed to try that approach.   IR arrived at this time to discuss upcoming surgery. Able to answer OTs questions about drain care and placement for  pt managing at home.   Pt about to resume more therapy.  The surgical team then arrived to talk with pt.  Stopped treatment so pt could talk with team.     Therapy Documentation Precautions:  Precautions Precautions: Fall Precaution Comments: drains, colostomy, rectal drainage Restrictions Weight Bearing Restrictions: No General: General OT Amount of Missed Time: 15 Minutes   Pain: Pain Assessment Pain Scale: 0-10 Pain Score: 0-No pain ADL: ADL Eating: Independent Where Assessed-Eating: Wheelchair Grooming: Independent Where Assessed-Grooming: Wheelchair Upper Body Bathing: Independent Where Assessed-Upper Body Bathing: Wheelchair Lower Body Bathing: Setup Where Assessed-Lower Body Bathing: Wheelchair Upper Body Dressing: Independent Where Assessed-Upper Body Dressing: Wheelchair Lower Body Dressing: Contact guard Where Assessed-Lower Body Dressing: Wheelchair Toileting: Supervision/safety Where Assessed-Toileting: Toilet (with empty ostomy bag at toilet) Toilet Transfer: Close supervision Toilet Transfer Method: Counselling psychologist: Energy manager: Not assessed Social research officer, government: Not assessed (will assess next session due to pt fearing lightheadedness) ADL Comments: pt uses sock aid and reacher to don clothing over LLE    Therapy/Group: Individual Therapy  [place] 04/20/2022, 11:16 AM

## 2022-04-20 NOTE — Progress Notes (Signed)
Occupational Therapy Note  Patient Details  Name: Tyler [name] MRN: [id number] Date of Birth: [birth year]  Today's Date: 04/20/2022 OT Missed Time: 57 Minutes Missed Time Reason: Other (comment) (pt just returned from OR wanting to eat) Pt greeted in bed eating lunch reporting having eventful day from going to OR, pt politely requesting to finish lunch and recuperate from surgery. Will f/u as time allows to make up for missed minutes.   Corinne Ports [facility] 04/20/2022, 3:33 PM

## 2022-04-21 LAB — CBC WITH DIFFERENTIAL/PLATELET
Abs Immature Granulocytes: 0.31 10*3/uL — ABNORMAL HIGH (ref 0.00–0.07)
Basophils Absolute: 0.1 10*3/uL (ref 0.0–0.1)
Basophils Relative: 1 %
Eosinophils Absolute: 0.2 10*3/uL (ref 0.0–0.5)
Eosinophils Relative: 2 %
HCT: 24.1 % — ABNORMAL LOW (ref 39.0–52.0)
Hemoglobin: 7.6 g/dL — ABNORMAL LOW (ref 13.0–17.0)
Immature Granulocytes: 3 %
Lymphocytes Relative: 27 %
Lymphs Abs: 2.8 10*3/uL (ref 0.7–4.0)
MCH: 29.2 pg (ref 26.0–34.0)
MCHC: 31.5 g/dL (ref 30.0–36.0)
MCV: 92.7 fL (ref 80.0–100.0)
Monocytes Absolute: 0.7 10*3/uL (ref 0.1–1.0)
Monocytes Relative: 7 %
Neutro Abs: 6.4 10*3/uL (ref 1.7–7.7)
Neutrophils Relative %: 60 %
Platelets: 336 10*3/uL (ref 150–400)
RBC: 2.6 MIL/uL — ABNORMAL LOW (ref 4.22–5.81)
RDW: 14.7 % (ref 11.5–15.5)
WBC: 10.5 10*3/uL (ref 4.0–10.5)
nRBC: 0 % (ref 0.0–0.2)

## 2022-04-21 MED ORDER — SODIUM CHLORIDE 0.9% FLUSH
5.0000 mL | Freq: Three times a day (TID) | INTRAVENOUS | Status: DC
  Administered 2022-04-21 – 2022-04-28 (×21): 5 mL

## 2022-04-21 NOTE — Progress Notes (Signed)
Occupational Therapy Session Note  Patient Details  Name: Tyler [name] MRN: [id number] Date of Birth: 03-Sep-1951  Today's Date: 04/21/2022 OT Individual Time: 1415-1530 OT Individual Time Calculation (min): 75 min    Short Term Goals: Week 2:  OT Short Term Goal 1 (Week 2): STGs = LTGs  Skilled Therapeutic Interventions/Progress Updates:    S: Pt in bed upon therapy arrival and reports that he would like to work on his LE flexibility and a muscle knot in his left scapular region.    - During session, patient was able to complete all stand pivot transfers from bed to w/c, w/c to mat table and back, and w/c to bed with supervision and no device.  - Patient with moderate trigger point palpated in left medial superior border of scapula. Provided trigger point release with good results. Max fascial restrictions noted in left upper trapezius region. Manual techniques were completed to address.  - Manual therapy: Completed prior to exercises. Myofascial release and trigger point release completed to left scapula region to decrease muscle restrictions and increase joint mobility in a pain free zone.  - While supine on mat table, patient completed a variety of hip stretches both dynamic and static to increase hip flexion and hip external rotation in order to increase functional performance during LB dressing/bathing tasks. Patient was able to verbalize and demonstrate understanding of education. Exercise modification were provided as needed to adjust for pain level. Discussed seated posture and scapular strength needs as patient completed A/ROM scapular row seated on edge of mat. Patient demonstrates forward head and rounded shoulders posture when seated unsupported.  During supine stretches, patient required VC and occasional tactile cues for proper form and technique.  P:  Continue to work on hip flexibility to increase LB ADL tasks. Add scapular strengthening and core strength to increase seated  posture and decrease strain on lower back.   Therapy Documentation Precautions:  Precautions Precautions: Fall Precaution Comments: drains, colostomy, rectal drainage Restrictions Weight Bearing Restrictions: No  Pain: No number provided for pain. Patient mentioned pain in his left shoulder pain when trying to sleep at night.   Therapy/Group: Individual Therapy  Ailene Ravel, OTR/L,CBIS  Supplemental OT - [facility]  04/21/2022, 1:01 PM

## 2022-04-21 NOTE — Progress Notes (Signed)
Referring Physician(s): Dr. 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 Most recent surgery on 03/20/22 abd washout and drain placement and creation of diverting colostomy.   IR interventions:  S/p left posterior thigh intramuscular abscess on 5/17 - Dr. Serafina Royals  S/p aspiration of left lower quadrant abscess on 5/29 - Dr. Denna Haggard S/p right TG drain placement by Dr. Serafina Royals on 04/20/22 - Dr. Serafina Royals    Subjective:  Patient laying in bed, NAD. PT at the bedside.  Reports achy, muscle -related pain in the right buttocks.  Denies abd pain, n/v, fever, chills.  Allergies: Fluconazole, Griseofulvin, Sulfa antibiotics, Oxycodone, Mometasone furo-formoterol fum, and Retapamulin  Medications: Prior to Admission medications   Medication Sig Start Date End Date Taking? Authorizing Provider  acetaminophen (TYLENOL) 500 MG tablet Take 2 tablets (1,000 mg total) by mouth every 6 (six) hours as needed for mild pain. 0/27/74   Leighton Ruff, MD  albuterol (VENTOLIN HFA) 108 (90 Base) MCG/ACT inhaler Inhale 2 puffs into the lungs every 6 (six) hours as needed for wheezing or shortness of breath.    [provider]  amLODipine (NORVASC) 5 MG tablet TAKE 1 TABLET BY MOUTH DAILY Patient taking differently: Take 5 mg by mouth in the morning. 04/29/14   Ricard Dillon, MD  ascorbic acid (VITAMIN C) 500 MG tablet Take 1 tablet (500 mg total) by mouth 2 (two) times daily. 12/12/76   Leighton Ruff, MD  azelastine (ASTELIN) 0.1 % nasal spray Place 1 spray into both nostrils 2 (two) times daily as needed for rhinitis. Use in each nostril as directed    [provider]  bisoprolol (ZEBETA) 10 MG tablet Take 10 mg by mouth at bedtime.    [provider]  diphenhydrAMINE (BENADRYL) 25 mg capsule  Take 1 capsule (25 mg total) by mouth every 6 (six) hours as needed for itching, allergies or sleep (anxiety). 6/76/72   Leighton Ruff, MD  feeding supplement (ENSURE ENLIVE / ENSURE PLUS) LIQD Take 237 mLs by mouth 3 (three) times daily between meals. 0/94/70   Leighton Ruff, MD  ferrous sulfate 325 (65 FE) MG tablet Take 1 tablet (325 mg total) by mouth 2 (two) times daily with a meal. 9/62/83   Leighton Ruff, MD  ibuprofen (ADVIL) 400 MG tablet Take 1-2 tablets (400-800 mg total) by mouth every 8 (eight) hours as needed for fever, mild pain or cramping. 6/62/94   Leighton Ruff, MD  loperamide (IMODIUM) 2 MG capsule Take 1 capsule (2 mg total) by mouth with breakfast, with lunch, and with evening meal. 7/65/46   Leighton Ruff, MD  methocarbamol 1000 MG TABS Take 1,000 mg by mouth every 8 (eight) hours as needed for muscle spasms. 03/17/53   Leighton Ruff, MD  micafungin in sodium chloride 0.9 % 100 mL '100mg'$  IV q24h 6/56/81   Leighton Ruff, MD  montelukast (SINGULAIR) 10 MG tablet TAKE 1 TABLET BY MOUTH EVERY NIGHT AT BEDTIME Patient taking differently: Take 10 mg by mouth at bedtime. 09/10/13   Ricard Dillon, MD  Multiple Vitamin (MULTIVITAMIN WITH MINERALS) TABS tablet Take 1 tablet by mouth daily. 2/75/17   Leighton Ruff, MD  NEXIUM 00FV 20 MG capsule Take 20 mg by mouth daily before breakfast.    [provider]  ondansetron (ZOFRAN-ODT) 4 MG disintegrating tablet Take 1 tablet (4 mg total)  by mouth every 6 (six) hours as needed for nausea. 9/37/90   Leighton Ruff, MD  piperacillin-tazobactam (ZOSYN) 3.375 GM/50ML IVPB Inject 50 mLs (3.375 g total) into the vein every 8 (eight) hours. 2/40/97   Leighton Ruff, MD  polycarbophil (FIBERCON) 625 MG tablet Take 1 tablet (625 mg total) by mouth 2 (two) times daily. 3/53/29   Leighton Ruff, MD  sodium chloride flush (NS) 0.9 % SOLN 10-40 mLs by Intracatheter route every 12 (twelve) hours. 08/08/25   Leighton Ruff, MD  sodium  chloride flush (NS) 0.9 % SOLN 10-40 mLs by Intracatheter route as needed (flush). 8/34/19   Leighton Ruff, MD  tadalafil (CIALIS) 10 MG tablet Take 10 mg by mouth daily as needed for erectile dysfunction.    [provider]  traMADol (ULTRAM) 50 MG tablet Take 1-2 tablets (50-100 mg total) by mouth every 6 (six) hours as needed for moderate pain or severe pain. 05/06/28   Leighton Ruff, MD  traZODone (DESYREL) 50 MG tablet Take 1 tablet (50 mg total) by mouth at bedtime as needed for sleep. 7/98/92   Leighton Ruff, MD  valACYclovir (VALTREX) 500 MG tablet Take 500 mg by mouth in the morning.    [provider]     Vital Signs: BP 131/86 (BP Location: Left Arm)   Pulse 77   Temp 98 F (36.7 C) (Oral)   Resp 16   Ht '5\' 9"'$  (1.753 m)   Wt 165 lb 9.1 oz (75.1 kg)   SpO2 99%   BMI 24.45 kg/m   Physical Exam Vitals reviewed.  Constitutional:      General: He is not in acute distress.    Appearance: Normal appearance. He is not ill-appearing.  HENT:     Head: Normocephalic.  Pulmonary:     Effort: Pulmonary effort is normal.  Abdominal:     General: Abdomen is flat.  Musculoskeletal:     Cervical back: Neck supple.  Skin:    General: Skin is warm and dry.     Comments: Positive R TG drain to suction bulb. Site is unremarkable with no erythema, edema, tenderness, bleeding or drainage. Suture and stat lock in place. Dressing is clean, dry, and intact. 10 ml of cloudy pink colored fluid noted in the bulb. Drain aspirates and flushes well.    Neurological:     Mental Status: He is alert and oriented to person, place, and time.  Psychiatric:        Mood and Affect: Mood normal.        Behavior: Behavior normal.        Judgment: Judgment normal.    Imaging: CT ABDOMEN PELVIS W CONTRAST  Result Date: 04/18/2022 CLINICAL DATA:  Follow-up intra-abdominal abscess. EXAM: CT ABDOMEN AND PELVIS WITH CONTRAST TECHNIQUE: Multidetector CT imaging of the abdomen and pelvis  was performed using the standard protocol following bolus administration of intravenous contrast. RADIATION DOSE REDUCTION: This exam was performed according to the departmental dose-optimization program which includes automated exposure control, adjustment of the mA and/or kV according to patient size and/or use of iterative reconstruction technique. CONTRAST:  117m OMNIPAQUE IOHEXOL 300 MG/ML  SOLN COMPARISON:  Most recent CT 04/10/2022 FINDINGS: Lower chest: Decreased size of left pleural effusion with only trace residual. Associated atelectasis. Hepatobiliary: Punctate hepatic granuloma. No suspicious liver lesion. Gallbladder physiologically distended, no calcified stone. No biliary dilatation. Pancreas: No ductal dilatation or inflammation. Spleen: Mildly enlarged spanning 14.2 cm cranial caudal. No focal splenic abnormality. Small amount of  perisplenic fluid adjacent to the inferior pole. Adrenals/Urinary Tract: No adrenal nodule. Previous anterior perirenal fluid collection no abuts the upper pole of the left kidney. There is no hydronephrosis. Small focus of air in the urinary bladder is likely related to prior instrumentation. No bladder wall thickening. Stomach/Bowel: Decompressed stomach. Few prominent fluid-filled loops of small bowel in the upper abdomen, no obstruction. Enteric contrast reaches the colon. Left lower quadrant colostomy. Stapled off sigmoid colon. Vascular/Lymphatic: Aortic atherosclerosis. Patent portal, splenic, and superior mesenteric veins. No bulky abdominopelvic adenopathy. Reproductive: Unremarkable. Other: Drainage catheter in the pelvis is looped within a presacral air-fluid fluid collection. This measures approximately 8.4 x 3.4 cm, series 3, image 76, previously 7.9 x 2.5 cm. There is an increased fluid component compared to prior exam. U shaped collections emanating anteriorly from this dominant presacral collection persist, coursing lateral to the bladder, series 3, image  73. These may encircle the bladder and communicate anteriorly, uncertain. These collections measure approximately 1 cm in thickness. The left retroperitoneal collection has slightly diminished, currently 3.1 x 1.4 cm, series 3, image 58, previously 4.7 x 1.6 cm. Left anterior perirenal collection persists, diminished in size currently 2.3 x 1.6 cm, series 3, image 36, previously 3.2 x 2.3 cm. The previous potential connection between these 2 fluid collections has diminished. Surgical drain located within the left anterior abdominal wall musculature has no associated fluid collection. Musculoskeletal: Intramuscular collection within the left obturator musculature measures 3 x 1.6 cm, series 3, image 87, previously 3.2 x 1.8 cm. Stable osseous structures. Stable right thigh intramuscular lipoma IMPRESSION: 1. Drainage catheter in the pelvis is looped within a presacral air-fluid collection. This measures approximately 8.4 x 3.4 cm, previously 7.9 x 2.5 cm. There is an increased fluid component compared to prior exam. U shaped collections emanating anteriorly from this dominant presacral collection coursing lateral to the bladder, 1 cm in thickness. These may encircle the bladder and communicate anteriorly, uncertain. 2. The left retroperitoneal collections have slightly diminished in size, lower quadrant collection currently measures 3.1 x 1.4 cm, anterior pararenal collection 2.3 x 1.6 cm. The previous potential connection between these 2 fluid collections has diminished. 3. Intramuscular collection within the left obturator musculature measures 3 x 1.6 cm, previously 3.2 x 1.8 cm. 4. Decreased size of left pleural effusion with only trace residual. 5. Mild splenomegaly. Aortic Atherosclerosis (ICD10-I70.0). Electronically Signed   By: Keith Rake M.D.   On: 04/18/2022 20:57   CT FEMUR LEFT W CONTRAST  Result Date: 04/18/2022 CLINICAL DATA:  Soft tissue infection suspected, thigh, xray done EXAM: CT OF THE  LOWER LEFT EXTREMITY WITH CONTRAST TECHNIQUE: Multidetector CT imaging of the lower left extremity was performed according to the standard protocol following intravenous contrast administration. RADIATION DOSE REDUCTION: This exam was performed according to the departmental dose-optimization program which includes automated exposure control, adjustment of the mA and/or kV according to patient size and/or use of iterative reconstruction technique. CONTRAST:  153m OMNIPAQUE IOHEXOL 300 MG/ML  SOLN COMPARISON:  Limited MRI 03/29/2022, included portions from prior abdominopelvic CTs. FINDINGS: Bones/Joint/Cartilage No fracture. No periosteal reaction, bone destruction, or erosions. Hip and knee alignment are maintained. Ligaments Suboptimally assessed by CT. Muscles and Tendons Left obturator fluid collection measures approximately 3.1 x 1.4 cm slightly diminished from prior abdominal CT 04/10/2022. There are multiple thin elongated fluid collections within left abductor muscle. Proximally this is seen on series 1, image 42 and series 6, image 25, spanning approximately 5.5 cm in length, subcentimeter in  thickness. In the midportion this is seen on image 161 and series 6, image 27, spanning 4.6 cm in length. Distal collection measures 1.6 x 1.1 x 4.6 cm, series 1, image 74 and series 6, image 24, this collection is peripherally enhancing and thick walled. Soft tissues Mild soft tissue edema.  No subcutaneous collection. IMPRESSION: 1. Multiple thin elongated fluid collections within the left abductor muscle, suspicious for abscesses. The largest of these measures 1.6 x 1.1 x 4.6 cm distally, thick-walled and peripherally enhancing. 2. Left obturator fluid collection measures 3.1 x 1.4 cm, slightly diminished from prior abdominal CT 04/10/2022. Electronically Signed   By: Keith Rake M.D.   On: 04/18/2022 21:03   CT IMAGE GUIDED DRAINAGE BY PERCUTANEOUS CATHETER  Result Date: 04/20/2022 INDICATION: 71 year old  male with history of diverting colostomy with persistent and enlarging presacral fluid collection concerning for abscess, currently with inadequate drainage via indwelling surgical drain. EXAM: CT IMAGE GUIDED DRAINAGE BY PERCUTANEOUS CATHETER COMPARISON:  None Available. MEDICATIONS: The patient is currently admitted to the hospital and receiving intravenous antibiotics. The antibiotics were administered within an appropriate time frame prior to the initiation of the procedure. ANESTHESIA/SEDATION: Moderate (conscious) sedation was employed during this procedure. A total of Versed 1.5 mg and Fentanyl 75 mcg was administered intravenously. Moderate Sedation Time: 15 minutes. The patient's level of consciousness and vital signs were monitored continuously by radiology nursing throughout the procedure under my direct supervision. CONTRAST:  None COMPLICATIONS: None immediate. PROCEDURE: RADIATION DOSE REDUCTION: This exam was performed according to the departmental dose-optimization program which includes automated exposure control, adjustment of the mA and/or kV according to patient size and/or use of iterative reconstruction technique. Informed written consent was obtained from the patient after a discussion of the risks, benefits and alternatives to treatment. The patient was placed in partial left lateral decubitus position on the CT gantry and a pre procedural CT was performed re-demonstrating the known abscess/fluid collection within the presacral region. The procedure was planned. A timeout was performed prior to the initiation of the procedure. The right gluteal region was prepped and draped in the usual sterile fashion. The overlying soft tissues were anesthetized with 1% lidocaine with epinephrine. Appropriate trajectory was planned with the use of a 22 gauge spinal needle. An 18 gauge trocar needle was advanced into the abscess/fluid collection and a short Amplatz super stiff wire was coiled within the  collection. Appropriate positioning was confirmed with a limited CT scan. The tract was serially dilated allowing placement of a 12 [place] all-purpose drainage catheter. Appropriate positioning was confirmed with a limited postprocedural CT scan. Approximately 30 ml of purulent fluid was aspirated. The tube was connected to a bulb suction and sutured in place. A dressing was placed. The patient tolerated the procedure well without immediate post procedural complication. IMPRESSION: Successful CT guided placement of a 30 French all purpose drain catheter into the presacral abscess via right transgluteal approach with aspiration of approximately 30 mL of purulent fluid. Samples were sent to the laboratory as requested by the ordering clinical team. Ruthann Cancer, MD [facility] Radiology Electronically Signed   By: Ruthann Cancer M.D.   On: 04/20/2022 17:50    Labs:  CBC: Recent Labs    04/15/22 0936 04/18/22 0925 04/20/22 0436 04/21/22 0458  WBC 9.4 15.0* 10.8* 10.5  HGB 8.1* 8.2* 7.5* 7.6*  HCT 24.3* 25.2* 23.5* 24.1*  PLT 304 347 327 336    COAGS: Recent Labs  03/18/22 0617 04/12/22 0644 04/20/22 1521  INR 1.4* 1.2 1.2    BMP: Recent Labs    03/23/22 0742 03/23/22 0904 04/04/22 2333 04/09/22 1925 04/10/22 0250 04/12/22 0644 04/13/22 0420 04/16/22 0434  NA QUESTIONABLE RESULTS, RECOMMEND RECOLLECT TO VERIFY   < > 134*  --  133* 132* 135  --   K QUESTIONABLE RESULTS, RECOMMEND RECOLLECT TO VERIFY   < > 3.6  --  3.4* 3.4* 4.0  --   CL QUESTIONABLE RESULTS, RECOMMEND RECOLLECT TO VERIFY   < > 107  --  100 99 103  --   CO2 QUESTIONABLE RESULTS, RECOMMEND RECOLLECT TO VERIFY   < > 21*  --  '24 25 26  '$ --   GLUCOSE QUESTIONABLE RESULTS, RECOMMEND RECOLLECT TO VERIFY   < > 113*  --  101* 98 105*  --   BUN QUESTIONABLE RESULTS, RECOMMEND RECOLLECT TO VERIFY   < > 24*  --  '12 8 10  '$ --   CALCIUM QUESTIONABLE RESULTS, RECOMMEND RECOLLECT  TO VERIFY   < > 7.7*  --  8.3* 8.2* 8.4*  --   CREATININE QUESTIONABLE RESULTS, RECOMMEND RECOLLECT TO VERIFY   < > 0.91   < > 0.92 1.00 1.00 0.93  GFRNONAA QUESTIONABLE RESULTS, RECOMMEND RECOLLECT TO VERIFY   < > >60   < > >60 >60 >60 >60  GFRAA QUESTIONABLE RESULTS, RECOMMEND RECOLLECT TO VERIFY  --   --   --   --   --   --   --    < > = values in this interval not displayed.    LIVER FUNCTION TESTS: Recent Labs    04/04/22 2333 04/08/22 0502 04/12/22 0644 04/13/22 0420  BILITOT 0.8 0.8 0.4 0.5  AST 51* '26 20 28  '$ ALT 55* 33 28 36  ALKPHOS 436* 357* 309* 287*  PROT 6.3* 6.6 6.3* 6.3*  ALBUMIN 1.9* 2.0* 1.9* 1.9*    Assessment and Plan:  71 y.o. 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 Most recent surgery on 03/20/22 abd washout and drain placement and creation of diverting colostomy, S/p right TG drain placement by Dr. Serafina Royals on 04/20/22.    WBC 10.5, trending down VSS OP 20 mL  Drain Location: right TG Size: Fr size: 12 Fr Date of placement: 04/20/22   Currently to: Drain collection device: suction bulb 24 hour output:  Output by Drain (mL) 04/19/22 0701 - 04/19/22 1900 04/19/22 1901 - 04/20/22 0700 04/20/22 0701 - 04/20/22 1900 04/20/22 1901 - 04/21/22 0700 04/21/22 0701 - 04/21/22 1154  Closed System Drain 1 Lateral;Right RLQ Bulb (JP) 19 Fr. 15  20    Closed System Drain 2 Lateral;Left LLQ Bulb (JP) 19 Fr. 30  95    Closed System Drain 3 Inferior;Right Back Bulb (JP) 12 Fr.   20      Interval imaging/drain manipulation:  None   Current examination: Flushes/aspirates easily.  Insertion site unremarkable. Suture and stat lock in place. Dressed appropriately.   Plan: Continue TID flushes with 5 cc NS. Record output Q shift. Dressing changes QD or PRN if soiled.  Call IR APP or on call IR MD if difficulty flushing or sudden change in drain output.  Repeat imaging/possible  drain injection once output < 10 mL/QD (excluding flush material.)  Discharge planning: Please contact IR APP or on call IR MD prior to patient d/c to ensure appropriate follow up plans are in place. Typically patient will follow  up with IR clinic 10-14 days post d/c for repeat imaging/possible drain injection. IR scheduler will contact patient with date/time of appointment. Patient will need to flush drain QD with 5 cc NS, record output QD, dressing changes every 2-3 days or earlier if soiled.   IR will continue to follow - please call with questions or concerns.  Electronically Signed: Tera Mater, PA-C 04/21/2022, 11:46 AM   I spent a total of 15 Minutes at the the patient's bedside AND on the patient's hospital floor or unit, greater than 50% of which was counseling/coordinating care for R TG drain.   This chart was dictated using voice recognition software.  Despite best efforts to proofread,  errors can occur which can change the documentation meaning.

## 2022-04-21 NOTE — Progress Notes (Signed)
ex lap, ostomy creation and drain placement Subjective: Known to our service - followed by Dr. Marcello Moores whom is currently out this week  Underwent perc drainage via gluteal approach yesterday with IR - feels MUCH better since this procedure. Minimal drainage from buttock wound now. Back pain/pernieal pain has now resolved. He can sit comfortably now. He is up in wheelchair shaving, oral care.   No n/v. Colostomy working well. No abdominal pain.   Objective: Vital signs in last 24 hours: Temp:  [98 F (36.7 C)-99 F (37.2 C)] 98 F (36.7 C) (06/07 0451) Pulse Rate:  [77-90] 77 (06/07 0451) Resp:  [14-20] 16 (06/07 0451) BP: (126-140)/(84-94) 131/86 (06/07 0451) SpO2:  [97 %-100 %] 99 % (06/07 0451)   Intake/Output from previous day: 06/06 0701 - 06/07 0700 In: 640 [P.O.:240; IV Piggyback:400] Out: 1235 [Urine:1100; Drains:135] Intake/Output this shift: No intake/output data recorded.   General appearance: alert and cooperative GI: soft, non-distended All drains with purulent fluid, milky/tan while Colostomy productive with semi formed brown stool  Lab Results:  Recent Labs    04/20/22 0436 04/21/22 0458  WBC 10.8* 10.5  HGB 7.5* 7.6*  HCT 23.5* 24.1*  PLT 327 336   BMET No results for input(s): NA, K, CL, CO2, GLUCOSE, BUN, CREATININE, CALCIUM in the last 72 hours.  PT/INR Recent Labs    04/20/22 1521  LABPROT 15.2  INR 1.2    ABG No results for input(s): PHART, HCO3 in the last 72 hours.  Invalid input(s): PCO2, PO2   MEDS, Scheduled  vitamin C  500 mg Oral BID   bisoprolol  10 mg Oral QHS   Chlorhexidine Gluconate Cloth  6 each Topical Daily   enoxaparin (LOVENOX) injection  40 mg Subcutaneous Q24H   famotidine  20 mg Oral BID   feeding supplement  237 mL Oral TID BM   ferrous sulfate  325 mg Oral BID WC   lip balm  1 application. Topical BID   loperamide  2 mg Oral TID with meals   methocarbamol  1,000 mg Oral TID   montelukast  10 mg Oral QHS    multivitamin with minerals  1 tablet Oral Daily   pantoprazole  40 mg Oral Daily   polycarbophil  1,250 mg Oral BID   sodium chloride flush  10-40 mL Intracatheter Q12H   sodium chloride flush  5 mL Intracatheter Q8H    Studies/Results: CT IMAGE GUIDED DRAINAGE BY PERCUTANEOUS CATHETER  Result Date: 04/20/2022 INDICATION: 71 year old male with history of diverting colostomy with persistent and enlarging presacral fluid collection concerning for abscess, currently with inadequate drainage via indwelling surgical drain. EXAM: CT IMAGE GUIDED DRAINAGE BY PERCUTANEOUS CATHETER COMPARISON:  None Available. MEDICATIONS: The patient is currently admitted to the hospital and receiving intravenous antibiotics. The antibiotics were administered within an appropriate time frame prior to the initiation of the procedure. ANESTHESIA/SEDATION: Moderate (conscious) sedation was employed during this procedure. A total of Versed 1.5 mg and Fentanyl 75 mcg was administered intravenously. Moderate Sedation Time: 15 minutes. The patient's level of consciousness and vital signs were monitored continuously by radiology nursing throughout the procedure under my direct supervision. CONTRAST:  None COMPLICATIONS: None immediate. PROCEDURE: RADIATION DOSE REDUCTION: This exam was performed according to the departmental dose-optimization program which includes automated exposure control, adjustment of the mA and/or kV according to patient size and/or use of iterative reconstruction technique. Informed written consent was obtained from the patient after a discussion of the risks, benefits and alternatives to  treatment. The patient was placed in partial left lateral decubitus position on the CT gantry and a pre procedural CT was performed re-demonstrating the known abscess/fluid collection within the presacral region. The procedure was planned. A timeout was performed prior to the initiation of the procedure. The right gluteal region  was prepped and draped in the usual sterile fashion. The overlying soft tissues were anesthetized with 1% lidocaine with epinephrine. Appropriate trajectory was planned with the use of a 22 gauge spinal needle. An 18 gauge trocar needle was advanced into the abscess/fluid collection and a short Amplatz super stiff wire was coiled within the collection. Appropriate positioning was confirmed with a limited CT scan. The tract was serially dilated allowing placement of a 12 [place] all-purpose drainage catheter. Appropriate positioning was confirmed with a limited postprocedural CT scan. Approximately 30 ml of purulent fluid was aspirated. The tube was connected to a bulb suction and sutured in place. A dressing was placed. The patient tolerated the procedure well without immediate post procedural complication. IMPRESSION: Successful CT guided placement of a 34 French all purpose drain catheter into the presacral abscess via right transgluteal approach with aspiration of approximately 30 mL of purulent fluid. Samples were sent to the laboratory as requested by the ordering clinical team. Ruthann Cancer, MD [facility] Radiology Electronically Signed   By: Ruthann Cancer M.D.   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 04/03/2022   Protein-calorie malnutrition, severe ([place]) 04/03/2022   Anxiety associated with depression 04/03/2022   Intra-abdominal abscess ([place]) 04/03/2022   Thigh abscess 04/03/2022   Normal anion gap metabolic acidosis 70/26/3785   Acute blood loss anemia (ABLA)  03/18/2022   Acute urinary retention 03/12/2022   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   Mild persistent asthma 03/12/2022   Abdominal pain 03/09/2022   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/09/2022   Pruritus ani 04/20/2018   Arthritis of both knees 01/13/2018   Hx of adenomatous polyp of colon 04/07/2017   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   Perianal dermatitis 01/14/2016   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/12/2013   History of tobacco use 11/12/2013   Low testosterone 09/19/2013   Sinusitis 12/21/2012   Herpes labialis 10/30/2012   Eczema 10/30/2012   Prolapsed internal hemorrhoids, grade 3, THD ligation 03/07/2022 10/30/2012   Hypertension 04/13/2011   FASCIITIS, PLANTAR 05/14/2010   NEOPLASM, SKIN, UNCERTAIN BEHAVIOR 88/50/2774   Chronic rhinitis 08/16/2008   Rash and other nonspecific skin eruption 07/02/2008   ABNORMAL COAGULATION PROFILE 05/29/2008   Gastroesophageal reflux disease 06/28/2007   PITYRIASIS ROSEA 06/28/2007   Osteoarthritis of knee 06/28/2007   Asthmatic bronchitis 05/11/2007    Pelvic sepsis.  Currently drained and debrided.    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    OR findings 5/6: significant RP infection, drained via L pericolic gutter, presacral infection, drained, anterior abd wall infection, drained.  CT  5/14: L RP fluid collection slightly small, L thigh fluid collection stable  IR aspiration: 5/16, cultures pending  CT 5/22: improving fluid collection, new left sided small pelvic collection, thigh fluid unchanged but doesn't appear rim enhancing to me  Plan:  Diet as tolerated, protein shakes  ID: Cont IV antibiotics per ID - appreciate excellent assistance in his care.  Pain: Cont scheduled tylenol, tramadol  prn, schedule robaxin, dilaudid prn   Anxiety: ativan   Cont SCD's, Lovenox SQ  Dressing changes q shift  IR drain 6/6 - presacral drain via transgluteal approach. Much improved now.  No further acute surgical needs identified, we will follow peripherally with you and will remain available for questions. Will need instruction on drain care with nursing - will place care order. Will also need to see Dr. Marcello Moores in office next week and we will work to arrange this.    LOS: 12 days   Nadeen Landau, MD [facility]  04/21/2022 9:56 AM

## 2022-04-21 NOTE — Patient Care Conference (Signed)
Inpatient RehabilitationTeam Conference and Plan of Care Update Date: 04/21/2022   Time: 11:40 AM    Patient Name: Tyler [name]      Medical Record Number: [id number]  Date of Birth: [birth year] Sex: Male         Room/Bed: 4M12C/4M12C-01 Payor Info: Payor: [place] / Plan: [place] PPO / Product Type: *No Product type* /    Admit Date/Time:  04/09/2022  1:39 PM  Primary Diagnosis:  Abscess, intra-abdominal, postoperative  Hospital Problems: Principal Problem:   Abscess, intra-abdominal, postoperative Active Problems:   Debility   Sepsis due to Enterococcus ([place])   E coli infection   Candida albicans infection   Leukocytosis   Pain in joint involving pelvic region and thigh    Expected Discharge Date: Expected Discharge Date: 04/28/22  Team Members Present: Physician leading conference: Dr. Jennye Boroughs Social Worker Present: Ovidio Kin, LCSW Nurse Present: Dorien Chihuahua, RN PT Present: Phylliss Bob, PTA;Leavy Cella, PT OT Present: Meriel Pica, OT PPS Coordinator present : Ileana Ladd, PT     Current Status/Progress Goal Weekly Team Focus  Bowel/Bladder     Continent of bladder Ostomy for bowel   Able to manage ostomy care and continent of bladder   Education on ostomy care  Swallow/Nutrition/ Hydration             ADL's   close to meeting his goals, close S overall with occasional A for L sock and shoe  mod I with most of his self care except for supervision toileting (colstomy bag) and homemaking/meal prep  ADl training, endurance, strength, pt and family education   Mobility   supervision for transfers and gait x 150'; CGA for stairs; missed therapys on 6/6 due to procedure but anticipate still on track to meet goals and d/c 6/9 if medically cleared  mod I transfers and gait, supervision stairs  fam ed, d/c planning, endurance, strengthening, stretching   Communication             Safety/Cognition/ Behavioral Observations             Pain     Minimal abd pain   Pain controlled   Monitor need for and effectiveness of meds  Skin     Ostomy JP x 3 and anal wound dressing   Patient and significant other able to manage skin care   Practice ostomy care, and JP drain care with dressing change educ.    Discharge Planning:  Partner has been in for education and have arranged IV antibiotics for home, along with continued therapies. Want to make sure medically ready for DC Friday   Team Discussion: Patient with poor endurance and requires increased time to complete tasks due to debility and anxiety. Required JP drain placement post fluid collection tap. Will need IV abx. through 05/12/22.  Patient on target to meet rehab goals: yes, patient is currently supervision overall for self care.  *See Care Plan and progress notes for long and short-term goals.   Revisions to Treatment Plan:  Extended stay due to loss of progress with medical procedures to allow time to practice and build endurance level.  Teaching Needs: Skin care, drain care, IV abx, medications, dietary modifications, safety, transfers, etc  Current Barriers to Discharge: Decreased caregiver support  Possible Resolutions to Barriers: Family education HH follow up services including IV meds and skin care Enrolled in [place] Start Discharge program: Yes. Box has arrived at home. DME: RW  Medical Summary Current Status: Pain,intra-abdominal infection, anemia, HTN, Hypokalemia, wound care, Asthma, leukocytosis  Barriers to Discharge: IV antibiotics;Medical stability  Barriers to Discharge Comments: Pain,intra-abdominal infection, anemia, HTN, Hypokalemia, wound care, Asthma, leukocytosis Possible Resolutions to [employer] Focus: follow by ID and surgery, monitor labs and replete electolytes, breathing treatments, surgical drains placed, wound care treatments   Continued Need for Acute Rehabilitation Level of Care: The patient requires  daily medical management by a physician with specialized training in physical medicine and rehabilitation for the following reasons: Direction of a multidisciplinary physical rehabilitation program to maximize functional independence : Yes Medical management of patient stability for increased activity during participation in an intensive rehabilitation regime.: Yes Analysis of laboratory values and/or radiology reports with any subsequent need for medication adjustment and/or medical intervention. : Yes   I attest that I was present, lead the team conference, and concur with the assessment and plan of the team.   Dorien Chihuahua B 04/21/2022, 3:12 PM

## 2022-04-21 NOTE — Discharge Summary (Signed)
Physician Discharge Summary  Patient ID: Tyler [name] MRN: [id number] DOB/AGE: Jul 22, 1951 71 y.o.  Admit date: 04/09/2022 Discharge date: 04/28/2022  Discharge Diagnoses:  Principal Problem:   Abscess, intra-abdominal, postoperative Active Problems:   Debility   Sepsis due to Enterococcus ([place])   E coli infection   Candida albicans infection   Leukocytosis   Pain in joint involving pelvic region and thigh DVT prophylaxis Hypertension Anemia/thrombocytopenia Decreased nutritional storage Wound lower buttock site of previous drain   Discharged Condition: Stable  Significant Diagnostic Studies: CT IMAGE GUIDED DRAINAGE BY PERCUTANEOUS CATHETER  Result Date: 04/20/2022 INDICATION: 71 year old male with history of diverting colostomy with persistent and enlarging presacral fluid collection concerning for abscess, currently with inadequate drainage via indwelling surgical drain. EXAM: CT IMAGE GUIDED DRAINAGE BY PERCUTANEOUS CATHETER COMPARISON:  None Available. MEDICATIONS: The patient is currently admitted to the hospital and receiving intravenous antibiotics. The antibiotics were administered within an appropriate time frame prior to the initiation of the procedure. ANESTHESIA/SEDATION: Moderate (conscious) sedation was employed during this procedure. A total of Versed 1.5 mg and Fentanyl 75 mcg was administered intravenously. Moderate Sedation Time: 15 minutes. The patient's level of consciousness and vital signs were monitored continuously by radiology nursing throughout the procedure under my direct supervision. CONTRAST:  None COMPLICATIONS: None immediate. PROCEDURE: RADIATION DOSE REDUCTION: This exam was performed according to the departmental dose-optimization program which includes automated exposure control, adjustment of the mA and/or kV according to patient size and/or use of iterative reconstruction technique. Informed written consent was obtained from the patient after a  discussion of the risks, benefits and alternatives to treatment. The patient was placed in partial left lateral decubitus position on the CT gantry and a pre procedural CT was performed re-demonstrating the known abscess/fluid collection within the presacral region. The procedure was planned. A timeout was performed prior to the initiation of the procedure. The right gluteal region was prepped and draped in the usual sterile fashion. The overlying soft tissues were anesthetized with 1% lidocaine with epinephrine. Appropriate trajectory was planned with the use of a 22 gauge spinal needle. An 18 gauge trocar needle was advanced into the abscess/fluid collection and a short Amplatz super stiff wire was coiled within the collection. Appropriate positioning was confirmed with a limited CT scan. The tract was serially dilated allowing placement of a 12 [place] all-purpose drainage catheter. Appropriate positioning was confirmed with a limited postprocedural CT scan. Approximately 30 ml of purulent fluid was aspirated. The tube was connected to a bulb suction and sutured in place. A dressing was placed. The patient tolerated the procedure well without immediate post procedural complication. IMPRESSION: Successful CT guided placement of a 35 French all purpose drain catheter into the presacral abscess via right transgluteal approach with aspiration of approximately 30 mL of purulent fluid. Samples were sent to the laboratory as requested by the ordering clinical team. Ruthann Cancer, MD [facility] Radiology Electronically Signed   By: Ruthann Cancer M.D.   On: 04/20/2022 17:50   CT FEMUR LEFT W CONTRAST  Result Date: 04/18/2022 CLINICAL DATA:  Soft tissue infection suspected, thigh, xray done EXAM: CT OF THE LOWER LEFT EXTREMITY WITH CONTRAST TECHNIQUE: Multidetector CT imaging of the lower left extremity was performed according to the standard protocol following intravenous  contrast administration. RADIATION DOSE REDUCTION: This exam was performed according to the departmental dose-optimization program which includes automated exposure control, adjustment of the mA and/or kV according to patient size and/or use of  iterative reconstruction technique. CONTRAST:  135m OMNIPAQUE IOHEXOL 300 MG/ML  SOLN COMPARISON:  Limited MRI 03/29/2022, included portions from prior abdominopelvic CTs. FINDINGS: Bones/Joint/Cartilage No fracture. No periosteal reaction, bone destruction, or erosions. Hip and knee alignment are maintained. Ligaments Suboptimally assessed by CT. Muscles and Tendons Left obturator fluid collection measures approximately 3.1 x 1.4 cm slightly diminished from prior abdominal CT 04/10/2022. There are multiple thin elongated fluid collections within left abductor muscle. Proximally this is seen on series 1, image 42 and series 6, image 25, spanning approximately 5.5 cm in length, subcentimeter in thickness. In the midportion this is seen on image 161 and series 6, image 27, spanning 4.6 cm in length. Distal collection measures 1.6 x 1.1 x 4.6 cm, series 1, image 74 and series 6, image 24, this collection is peripherally enhancing and thick walled. Soft tissues Mild soft tissue edema.  No subcutaneous collection. IMPRESSION: 1. Multiple thin elongated fluid collections within the left abductor muscle, suspicious for abscesses. The largest of these measures 1.6 x 1.1 x 4.6 cm distally, thick-walled and peripherally enhancing. 2. Left obturator fluid collection measures 3.1 x 1.4 cm, slightly diminished from prior abdominal CT 04/10/2022. Electronically Signed   By: MKeith RakeM.D.   On: 04/18/2022 21:03   CT ABDOMEN PELVIS W CONTRAST  Result Date: 04/18/2022 CLINICAL DATA:  Follow-up intra-abdominal abscess. EXAM: CT ABDOMEN AND PELVIS WITH CONTRAST TECHNIQUE: Multidetector CT imaging of the abdomen and pelvis was performed using the standard protocol following bolus  administration of intravenous contrast. RADIATION DOSE REDUCTION: This exam was performed according to the departmental dose-optimization program which includes automated exposure control, adjustment of the mA and/or kV according to patient size and/or use of iterative reconstruction technique. CONTRAST:  1070mOMNIPAQUE IOHEXOL 300 MG/ML  SOLN COMPARISON:  Most recent CT 04/10/2022 FINDINGS: Lower chest: Decreased size of left pleural effusion with only trace residual. Associated atelectasis. Hepatobiliary: Punctate hepatic granuloma. No suspicious liver lesion. Gallbladder physiologically distended, no calcified stone. No biliary dilatation. Pancreas: No ductal dilatation or inflammation. Spleen: Mildly enlarged spanning 14.2 cm cranial caudal. No focal splenic abnormality. Small amount of perisplenic fluid adjacent to the inferior pole. Adrenals/Urinary Tract: No adrenal nodule. Previous anterior perirenal fluid collection no abuts the upper pole of the left kidney. There is no hydronephrosis. Small focus of air in the urinary bladder is likely related to prior instrumentation. No bladder wall thickening. Stomach/Bowel: Decompressed stomach. Few prominent fluid-filled loops of small bowel in the upper abdomen, no obstruction. Enteric contrast reaches the colon. Left lower quadrant colostomy. Stapled off sigmoid colon. Vascular/Lymphatic: Aortic atherosclerosis. Patent portal, splenic, and superior mesenteric veins. No bulky abdominopelvic adenopathy. Reproductive: Unremarkable. Other: Drainage catheter in the pelvis is looped within a presacral air-fluid fluid collection. This measures approximately 8.4 x 3.4 cm, series 3, image 76, previously 7.9 x 2.5 cm. There is an increased fluid component compared to prior exam. U shaped collections emanating anteriorly from this dominant presacral collection persist, coursing lateral to the bladder, series 3, image 73. These may encircle the bladder and communicate  anteriorly, uncertain. These collections measure approximately 1 cm in thickness. The left retroperitoneal collection has slightly diminished, currently 3.1 x 1.4 cm, series 3, image 58, previously 4.7 x 1.6 cm. Left anterior perirenal collection persists, diminished in size currently 2.3 x 1.6 cm, series 3, image 36, previously 3.2 x 2.3 cm. The previous potential connection between these 2 fluid collections has diminished. Surgical drain located within the left anterior abdominal wall musculature has  no associated fluid collection. Musculoskeletal: Intramuscular collection within the left obturator musculature measures 3 x 1.6 cm, series 3, image 87, previously 3.2 x 1.8 cm. Stable osseous structures. Stable right thigh intramuscular lipoma IMPRESSION: 1. Drainage catheter in the pelvis is looped within a presacral air-fluid collection. This measures approximately 8.4 x 3.4 cm, previously 7.9 x 2.5 cm. There is an increased fluid component compared to prior exam. U shaped collections emanating anteriorly from this dominant presacral collection coursing lateral to the bladder, 1 cm in thickness. These may encircle the bladder and communicate anteriorly, uncertain. 2. The left retroperitoneal collections have slightly diminished in size, lower quadrant collection currently measures 3.1 x 1.4 cm, anterior pararenal collection 2.3 x 1.6 cm. The previous potential connection between these 2 fluid collections has diminished. 3. Intramuscular collection within the left obturator musculature measures 3 x 1.6 cm, previously 3.2 x 1.8 cm. 4. Decreased size of left pleural effusion with only trace residual. 5. Mild splenomegaly. Aortic Atherosclerosis (ICD10-I70.0). Electronically Signed   By: Keith Rake M.D.   On: 04/18/2022 20:57   CT ASPIRATION  Result Date: 04/12/2022 INDICATION: Abdominal abscess EXAM: CT-guided aspiration of abdominal abscess TECHNIQUE: Multidetector CT imaging of the abdomen was performed  following the standard protocol without IV contrast. RADIATION DOSE REDUCTION: This exam was performed according to the departmental dose-optimization program which includes automated exposure control, adjustment of the mA and/or kV according to patient size and/or use of iterative reconstruction technique. MEDICATIONS: The patient is currently admitted to the hospital and receiving intravenous antibiotics. The antibiotics were administered within an appropriate time frame prior to the initiation of the procedure. ANESTHESIA/SEDATION: Moderate (conscious) sedation was employed during this procedure. A total of Versed 2 mg and Fentanyl 100 mcg was administered intravenously by the radiology nurse. Total intra-service moderate Sedation Time: 10 minutes. The patient's level of consciousness and vital signs were monitored continuously by radiology nursing throughout the procedure under my direct supervision. COMPLICATIONS: None immediate. PROCEDURE: Informed written consent was obtained from the patient after a thorough discussion of the procedural risks, benefits and alternatives. All questions were addressed. Maximal Sterile Barrier Technique was utilized including caps, mask, sterile gowns, sterile gloves, sterile drape, hand hygiene and skin antiseptic. A timeout was performed prior to the initiation of the procedure. The patient was placed supine on the exam table. Limited CT of the abdomen was performed for planning purposes. This again demonstrated a small collection in the left lower quadrant. Skin entry site was marked, the overlying skin was prepped and draped in a standard sterile fashion. Local analgesia was obtained with 1% lidocaine. Using intermittent CT fluoroscopy, a 19 gauge Yueh catheter was advanced towards the identified left lower quadrant collection. Location was confirmed with CT and return of purulent material. Primary aspiration was performed as the collection was felt to be too small for safe  drain placement. A total of 15 mL of purulent material was aspirated, a sample was sent to the lab for analysis. Postprocedure CT demonstrated no hematoma common appropriate decompression of the abscess cavity. A clean dressing was placed. The patient tolerated the procedure well without immediate complication. IMPRESSION: Successful CT-guided aspiration of left lower quadrant abscess, with removal of 15 mL of purulent material. Sample sent to the lab for microbiology analysis. Electronically Signed   By: Albin Felling M.D.   On: 04/12/2022 10:43   CT ABDOMEN PELVIS W CONTRAST  Result Date: 04/10/2022 CLINICAL DATA:  Intra-abdominal abscess EXAM: CT ABDOMEN AND PELVIS WITH CONTRAST  TECHNIQUE: Multidetector CT imaging of the abdomen and pelvis was performed using the standard protocol following bolus administration of intravenous contrast. RADIATION DOSE REDUCTION: This exam was performed according to the departmental dose-optimization program which includes automated exposure control, adjustment of the mA and/or kV according to patient size and/or use of iterative reconstruction technique. CONTRAST:  170m OMNIPAQUE IOHEXOL 300 MG/ML  SOLN COMPARISON:  04/05/2021 FINDINGS: Lower chest: Small left pleural effusion and associated atelectasis or consolidation, similar to prior examination. Coronary artery disease. Hepatobiliary: No solid liver abnormality is seen. No gallstones, gallbladder wall thickening, or biliary dilatation. Pancreas: Unremarkable. No pancreatic ductal dilatation or surrounding inflammatory changes. Spleen: Normal in size without significant abnormality. Adrenals/Urinary Tract: Adrenal glands are unremarkable. Kidneys are normal, without renal calculi, solid lesion, or hydronephrosis. Bladder is unremarkable. Stomach/Bowel: Stomach is within normal limits. Appendix is surgically absent. Left lower quadrant diverting colostomy. Vascular/Lymphatic: Aortic atherosclerosis. No enlarged abdominal  or pelvic lymph nodes. Reproductive: No mass or other significant abnormality. Other: Left lower quadrant colostomy. There is a complex left retroperitoneal fluid collection, with components that appear to communicate overlying the Gerota's fascia and left psoas musculature (series 3, image 39, 59). A previously seen surgical drain within the inferior aspect of this collection has been removed. The superior aspect of the collection is diminished in size, measuring 3.2 x 2.3 cm, previously 4.4 x 2.8 cm (series 3, image 37). Inferior aspect of the collection is similar, measuring 4.7 x 1.6 cm (series 3, image 59). Additional presacral air and fluid collection contains 1 surgical drain, others removed and is similar in size, measuring 7.9 x 2.5 cm (series 3, image 75). Anasarca. Musculoskeletal: No acute or significant osseous findings. IMPRESSION: 1. There is a complex left retroperitoneal fluid collection, with superior and inferior components that appear to communicate overlying the Gerota's fascia and left psoas musculature. A previously seen surgical drain within the inferior aspect of this collection has been removed. The superior aspect of the collection is diminished in size, measuring 3.2 x 2.3 cm, previously 4.4 x 2.8 cm. Inferior aspect of the collection is similar, measuring 4.7 x 1.6 cm. 2. Additional presacral air and fluid collection contains one surgical drain, others removed and is similar in size, measuring 7.9 x 2.5 cm. 3. Left lower quadrant diverting colostomy. 4. Small left pleural effusion and associated atelectasis or consolidation, similar to prior examination. 5. Coronary artery disease. Aortic Atherosclerosis (ICD10-I70.0). Electronically Signed   By: ADelanna AhmadiM.D.   On: 04/10/2022 16:11   DG Chest 2 View  Result Date: 04/09/2022 CLINICAL DATA:  Shortness of breath. EXAM: CHEST - 2 VIEW COMPARISON:  03/28/2022. FINDINGS: Heart is enlarged and the mediastinal contour is stable. There  is a small pleural effusion on the left with atelectasis. The right lung is clear. No pneumothorax. A right PICC line terminates over the superior vena cava. No acute osseous abnormality. IMPRESSION: Small left pleural effusion with atelectasis at the left lung base. Electronically Signed   By: LBrett FairyM.D.   On: 04/09/2022 22:33   [place]KG SITE RITE  Result Date: 04/07/2022 If Site Rite image not attached, placement could not be confirmed due to current cardiac rhythm.  CT ABDOMEN PELVIS W CONTRAST  Result Date: 04/05/2022 CLINICAL DATA:  Intra-abdominal abscess. Recent diverting colostomy. EXAM: CT ABDOMEN AND PELVIS WITH CONTRAST TECHNIQUE: Multidetector CT imaging of the abdomen and pelvis was performed using the standard protocol following bolus administration of intravenous contrast. RADIATION DOSE REDUCTION: This exam was  performed according to the departmental dose-optimization program which includes automated exposure control, adjustment of the mA and/or kV according to patient size and/or use of iterative reconstruction technique. CONTRAST:  144m OMNIPAQUE IOHEXOL 300 MG/ML  SOLN COMPARISON:  03/28/2022 and earlier FINDINGS: Lower chest: There is minimal atelectasis at the RIGHT lung base. Slight interval improvement in small RIGHT pleural effusion. There has been slight improvement in LEFT pleural effusion and LEFT basilar atelectasis/consolidation. Heart size is normal. Hepatobiliary: No focal liver abnormality is seen. No radiopaque gallstones, biliary dilatation, or pericholecystic inflammatory changes. Pancreas: Unremarkable. No pancreatic ductal dilatation or surrounding inflammatory changes. Spleen: Normal in size without focal abnormality. Adrenals/Urinary Tract: Normal appearance of the adrenal glands and kidneys. Urinary bladder is decompressed by a Foley catheter. Stomach/Bowel: Stomach and small bowel loops are normal in appearance. Diverting colostomy in the LEFT mid abdomen is  again noted. There is mild stranding surrounding the colonic loops and mesentery in the LEFT mid abdomen, similar in appearance to prior study. No evidence for obstructing lesion. Vascular/Lymphatic: There is atherosclerotic calcification of the abdominal aorta. No retroperitoneal or mesenteric adenopathy. Reproductive: Prostate is unremarkable. Other: No interval change in the appearance of surgical drains. No new fluid collections surrounding any of the surgical drains. There is similar appearance of presacral locules of gas. Similar appearance bilateral pelvic wall gas. There is a possible developing small fluid collection along the LEFT pelvic sidewall measures 5.2 x 2.2 centimeters on image 75 of series 2. Air-fluid collection identified anterior to the LEFT kidney is 4.4 x 2.8 centimeters (image 35 of series 2). Previously, this collection measured 4.8 x 4.0 centimeters Significant improvement in body wall edema. Musculoskeletal: No acute or significant osseous findings. There are persistent low-attenuation small fluid collections in the proximal LEFT thigh, similar in appearance to prior studies. Largest collection is in the LEFT pectineus, measuring 1.9 x 3.3 centimeters on image 84 of series 2. Smaller collections are seen in the LEFT obturator and proximal adductors. (Images 86 of series 2 and 94 series 2. The appearance is similar compared to prior studies. IMPRESSION: 1. Improvement in small bilateral pleural effusions and bibasilar atelectasis/consolidation. 2. Improvement in body wall edema. 3. Smaller air-fluid collection anterior to the LEFT kidney. 4. Similar postoperative and inflammatory changes in the LEFT mid abdomen. 5. Possible developing small fluid collection in the LEFT pelvic sidewall, 5.2 x 2.2 centimeters. 6. Unchanged appearance of multiple surgical drains. No new fluid collections adjacent to the drains. 7. Similar appearance of multiple probable intramuscular abscesses in the proximal  LEFT thigh. Electronically Signed   By: ENolon NationsM.D.   On: 04/05/2022 16:59   [place]FINE NEEDLE ASP 1ST LESION  Result Date: 03/31/2022 INDICATION: 71year old male with history of recent diverting colostomy status post hemorrhoid embolization procedure with development of left lower extremity intramuscular fluid collections concerning for abscess. EXAM: Ultrasound-guided aspiration of left lower extremity fluid collection MEDICATIONS: The patient is currently admitted to the hospital and receiving intravenous antibiotics. The antibiotics were administered within an appropriate time frame prior to the initiation of the procedure. ANESTHESIA/SEDATION: None. COMPLICATIONS: None immediate. PROCEDURE: Informed written consent was obtained from the patient after a thorough discussion of the procedural risks, benefits and alternatives. All questions were addressed. Maximal Sterile Barrier Technique was utilized including caps, mask, sterile gowns, sterile gloves, sterile drape, hand hygiene and skin antiseptic. A timeout was performed prior to the initiation of the procedure. Preprocedure ultrasound evaluation of the left lower extremity was performed. Within the  intramuscular aspect of the posterior left thigh in the mid to distal portions are phlegmonous changes with a few interspersed simple appearing fluid collections. The procedure was planned. The left lower extremity is prepped and draped in standard fashion. Subdermal Local anesthesia was administered at the planned needle entry site with 1% lidocaine. Under direct ultrasound visualization, a 5 [place], 7 cm Yueh catheter was directed into the fluid collection. Aspiration was performed which yielded approximately 10 mL of purulent fluid. Samples were sent for culture. The needle was removed. Postprocedure ultrasound of it patient demonstrated no evidence of hematoma or other complicating features. A bandage was applied. The patient tolerated the procedure  well was transferred back to the ICU in stable condition. IMPRESSION: Technically successful ultrasound-guided aspiration of left posterior thigh intramuscular abscess yielding approximately 10 mL of purulent fluid. Ruthann Cancer, MD [facility] Radiology Electronically Signed   By: Ruthann Cancer M.D.   On: 03/31/2022 08:07   [place] LT LOWER EXTREM LTD SOFT TISSUE NON VASCULAR  Result Date: 03/30/2022 CLINICAL DATA:  71 year old male with history of recent diverting colostomy status post hemorrhoid embolization procedure with development of left lower extremity swelling and possible fluid collections. EXAM: ULTRASOUND LEFT LOWER EXTREMITY LIMITED TECHNIQUE: Ultrasound examination of the lower extremity soft tissues was performed in the area of clinical concern. COMPARISON:  03/28/2022, 03/29/2022 FINDINGS: Within the mid to distal posterolateral thigh musculature are multifocal, irregularly-shaped heterogeneously hypoechoic structures without significant internal vascularity. Given morphologic irregularity, the fluid collections are difficult to measure in entirety. IMPRESSION: Multifocal, irregularly-shaped mildly complex fluid collections within the mid to distal left posterolateral thigh musculature. Favored to represent abscesses given findings on comparison studies. Ruthann Cancer, MD [facility] Radiology Electronically Signed   By: Ruthann Cancer M.D.   On: 03/30/2022 10:06   MR FEMUR LEFT WO CONTRAST  Result Date: 03/29/2022 CLINICAL DATA:  History of hemorrhoid surgery and now has left thigh pain from hip to the knee. EXAM: MR OF THE LEFT FEMUR WITHOUT CONTRAST TECHNIQUE: Multiplanar, multisequence MR imaging of the left femur was performed. No intravenous contrast was administered. COMPARISON:  None Available. FINDINGS: Incomplete examination, patient was unable to tolerate and complete examination. Only axial  T1 and coronal T1 and STIR images were obtained. Bones/Joint/Cartilage No appreciable fracture of the partially imaged left femur. No appreciable joint effusion. Degenerative changes of the left hip joint without evidence of significant joint effusion. Muscles and Tendons Muscles are normal in bulk and signal. No intramuscular hematoma or significant atrophy. Soft tissues Skin and subcutaneous soft tissues are within normal limits. IMPRESSION: Limited examination of the left femur. Within the above-mentioned limitations no acute osseous or soft tissue abnormality. Electronically Signed   By: Keane Police D.O.   On: 03/29/2022 21:59   CT ABDOMEN PELVIS W CONTRAST  Result Date: 03/28/2022 CLINICAL DATA:  Abdominal pain, recent diverting colostomy. EXAM: CT ABDOMEN AND PELVIS WITH CONTRAST TECHNIQUE: Multidetector CT imaging of the abdomen and pelvis was performed using the standard protocol following bolus administration of intravenous contrast. RADIATION DOSE REDUCTION: This exam was performed according to the departmental dose-optimization program which includes automated exposure control, adjustment of the mA and/or kV according to patient size and/or use of iterative reconstruction technique. CONTRAST:  163m OMNIPAQUE IOHEXOL 300 MG/ML  SOLN COMPARISON:  03/26/2022 FINDINGS: Lower chest: Small bilateral pleural effusions and dependent lower lobe atelectasis, left greater than right. Stable a cardiomegaly. Hepatobiliary: No focal liver abnormality is seen.  No gallstones, gallbladder wall thickening, or biliary dilatation. Pancreas: Unremarkable. No pancreatic ductal dilatation or surrounding inflammatory changes. Spleen: Normal in size without focal abnormality. Adrenals/Urinary Tract: Kidneys enhance normally and symmetrically. The adrenals are unremarkable. Bladder is decompressed with a Foley catheter. 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 No evidence of bowel obstruction or ileus. Vascular/Lymphatic: Stable aortic atherosclerosis. Multiple subcentimeter lymph nodes in the mesentery are likely reactive. No pathologic adenopathy. Reproductive: Prostate is unremarkable. Other: Persistent inflammatory changes in the perirectal and presacral tissues, with soft tissue gas seen in the presacral space and extending along the bilateral pelvic sidewalls unchanged. There is a surgical drain in the presacral space via a right gluteal approach unchanged. There are 3 additional surgical drains. Most inferior drain is within the lower anterior abdominal wall within the rectus musculature, with no surrounding fluid collection. Two other drains entering the peritoneal space view of the right lower quadrant and left lower quadrant are coiled within the lower pelvis. The drains are unchanged in appearance. Inflammatory changes are again seen within the left lower quadrant mesentery. A stable gas and fluid collection anterior to the left kidney is again identified, measuring approximately 4.8 x 4.0 x 7.2 cm, slightly decreased in size since prior study. This is likely contiguous with the area drained by the left lower quadrant surgical drain. Musculoskeletal: No acute or destructive bony lesions. Intramuscular gas/fluid collections are seen within the medial and posterior proximal left thigh, concerning for intramuscular abscesses. Largest collection within the hamstring musculature proximal left thigh image 106/2 measures 5.1 x 3.1 cm. Reconstructed images demonstrate no additional findings. IMPRESSION: 1. Persistent gas and fluid collection anterior to the left kidney, likely contiguous with the area drain by the left lower quadrant surgical drain. Slight interval decrease in size since prior study. 2. Extensive inflammatory changes within the presacral space and along the pelvic sidewall, with numerous  surgical drains as above, without significant change since prior study. 3. Interval progression of intramuscular fluid collections within the medial and posterior musculature of the proximal left thigh, concerning for intramuscular abscesses. Largest collection within the left hamstring musculature as above. 4. Left lower quadrant colostomy, with persistent wall thickening of the distal colon just proximal to the colostomy site. This may be secondary inflammatory change due to the intraperitoneal process, versus primary colitis. 5. Bilateral pleural effusions and dependent atelectasis, left greater than right, with slight progression since prior study. 6.  Aortic Atherosclerosis (ICD10-I70.0). Electronically Signed   By: Randa Ngo M.D.   On: 03/28/2022 17:18   DG CHEST PORT 1 VIEW  Result Date: 03/28/2022 CLINICAL DATA:  Fever EXAM: PORTABLE CHEST 1 VIEW COMPARISON:  03/27/2022 FINDINGS: Unchanged AP portable examination with small, layering left pleural effusion. Cardiomegaly. Right upper extremity PICC. IMPRESSION: Unchanged AP portable examination with small, layering left pleural effusion. Cardiomegaly. Electronically Signed   By: Delanna Ahmadi M.D.   On: 03/28/2022 10:34   DG Chest Portable 1 View  Result Date: 03/27/2022 CLINICAL DATA:  Cough and fever. EXAM: PORTABLE CHEST 1 VIEW COMPARISON:  Mar 19, 2022 FINDINGS: There is stable right-sided PICC line positioning. The cardiac silhouette is mildly enlarged and unchanged in size. Decreased lung volumes are seen. Mild atelectasis and/or infiltrate is noted within the left lung base. A very small left pleural effusion is suspected. No pneumothorax is identified. The visualized skeletal structures are unremarkable. IMPRESSION: Mild left basilar atelectasis  and/or infiltrate with a very small left pleural effusion. Electronically Signed   By: Virgina Norfolk M.D.   On: 03/27/2022 01:14    Labs:  Basic Metabolic Panel: Recent Labs  Lab  04/23/22 0503 04/24/22 0441  NA  --  136  K  --  4.1  CL  --  99  CO2  --  26  GLUCOSE  --  96  BUN  --  17  CREATININE 0.76 0.91  CALCIUM  --  9.1    CBC: Recent Labs  Lab 04/20/22 0436 04/21/22 0458 04/24/22 0441 04/25/22 0420  WBC 10.8* 10.5 11.4* 10.2  NEUTROABS 6.8 6.4  --   --   HGB 7.5* 7.6* 7.4* 7.8*  HCT 23.5* 24.1* 23.6* 23.7*  MCV 94.0 92.7 91.8 91.9  PLT 327 336 378 366    CBG: No results for input(s): "GLUCAP" in the last 168 hours.  Family history.  Mother with AAA Sister with glaucoma paternal grandfather with stomach cancer.  Negative for colon cancer esophageal cancer or rectal cancer  Brief HPI:   Tyler [name] is a 71 y.o. right-handed male with history of anxiety thrombocytopenia hypertension quit smoking 33 years ago.  Per chart review lives with significant other independent prior to admission.  Patient with recent transanal hemorrhoidal dearterialization 4/21 outpatient procedure per Dr. Leighton Ruff.  Postoperative course complicated by increasing abdominal pain nausea and fever that persisted x2 days.  He presented to the emergency department 03/09/2022.  Chemistries showed sodium 132 potassium 3.4 BUN 27 creatinine 1.27 his white count was upper end of normal at 10.1.  CT scan of the abdomen pelvis performed showed thickening of the rectal wall.  There was also gas and fluid within the rectal wall and in the presacral space concerning for possible rectal perforation.  He was made n.p.o. maintained on TPN for nutritional support with broad-spectrum antibiotics.  His white count increased to 20,000.  He underwent anal exam under anesthesia with debridement, incision and drainage of anal canal proctoscopy 03/15/2022.  Hospital course persistent fevers transferred to progressive care underwent diagnostic laparoscopy, abdominal washout and drain placement with creation of diverting colostomy 03/20/2022.  Follow-up CT abdomen pelvis 03/26/2022 showed extensive  edema and inflammation and soft tissue gas in the pelvic floor and presacral space as well as 5.3 x 3.5 x 7.2 cm collection of gas and fluid in the left upper quadrant anterior to the kidney and medial to the spleen.  Infectious disease Dr. Verl Blalock is consulted in regards to suspected intra-abdominal infection and currently maintained on intravenous Zosyn for coverage as well as micafungin x6 weeks from 5/16 with repeat CT abdomen pelvis left thigh in 1 to 2 weeks.  Latest CT abdomen pelvis 04/05/2022 showed improvement in small bilateral pleural effusions and bibasilar atelectasis as well as improvement in body wall edema.  Small air-fluid collection anterior to the left kidney with possibly developing small fluid collection in the left pelvic sidewall 5.2 x 2.2 cm.  No new fluid collections adjacent to the drains.  He was cleared to begin Lovenox for DVT prophylaxis.  Left lower quadrant JP drain inadvertently came out around 6 AM 04/06/2022 with fall per surgery advise just continue to monitor with dry dressing applied.  Hospital course anemia 8.0.  His white count improved to 5300.  Bouts of urinary retention he did require Foley tube with plans for voiding trial.  Diet advanced to regular.  Therapy evaluations completed due to patient decreased functional mobility was  admitted for a comprehensive rehab program.   Hospital Course: Tyler [name] was admitted to rehab 04/09/2022 for inpatient therapies to consist of PT, ST and OT at least three hours five days a week. Past admission physiatrist, therapy team and rehab RN have worked together to provide customized collaborative inpatient rehab.  Pertaining to patient debility secondary to pelvic abscess status post laparoscopy, abdominal washout drain placement creation of diverting colostomy 03/20/2022.  Patient continued to be followed by general surgery as well as infectious disease.  Wound care nurse follow-up for colostomy care.  Lovenox for DVT prophylaxis  venous Doppler studies negative.  Pain management with the use of Ultram.  In regards to ID/intra-abdominal infection currently maintained on  Primaxin with antibiotic duration to be established by infectious disease.  5/27 patient did have a low-grade temperature heart rate 115 somewhat diaphoretic orders placed for CBC/BMET blood cultures x2 chest x-ray COVID respiratory panels repeat CT abdomen pelvis.  ID consulted the evening of 5/26 with recommendations given to continue current regimen.  Respiratory panel COVID-negative chest x-ray showed small left pleural effusion with atelectasis at the left lung base.  Latest CT/abdomen pelvis demonstrated complex left retroperitoneal fluid collection with superior-inferior components that appear to communicate overlying the Gerotas fascia and left psoas musculature.  On 5/28 2023 interventional radiology consulted for intra-abdominal fluid collection Dr.El-Abd approved patient for aspiration of inferior portion of fluid collection.  General surgery again was renotified as they had recently signed off of the events noted.  On 5/29 interventional radiology completed aspiration of 15 mL of purulent material no drain was placed.  On 5/31 culture of left flank abscess with GNR and gram-positive cocci.  On 6/5 noted new fluid collection infectious disease changed antibiotic therapy to his present Primaxin and follow-up per general surgery with CT-guided presacral drain placed per interventional radiology.  Noted anemia 7.8 and monitored.  Blood pressures were soft he continued on Zebeta.  Decreased nutritional storage dietary follow-up maintained on nutritional supplements.  Early hospital course urinary retention Foley tube removed voiding without difficulty.  Wound care lower buttock site of previous drain wound care nurse follow-up for wound care is directed.   Blood pressures were monitored on TID basis and soft and monitored     Rehab course: During patient's stay  in rehab weekly team conferences were held to monitor patient's progress, set goals and discuss barriers to discharge. At admission, patient required minimal assist 5 feet rolling walker minimal assist step pivot transfers  Physical exam.  Blood pressure 118/70 pulse 90 respirations 18 oxygen saturations 92% room air Constitutional.  No acute distress HEENT Head.  Normocephalic and atraumatic Eyes.  Pupils round and reactive to light no discharge without nystagmus Neck.  Supple nontender no JVD without thyromegaly Cardiac regular rate and rhythm without any extra sounds or murmur heard Abdomen.  Soft nontender positive bowel sounds without rebound colostomy in place Respiratory.  Effort normal no respiratory distress without wheeze Neurologic.  Alert oriented x3 and follows commands. Skin.  Pelvis JP in place, RP former drain site erythematous  He/She  has had improvement in activity tolerance, balance, postural control as well as ability to compensate for deficits. He/She has had improvement in functional use RUE/LUE  and RLE/LLE as well as improvement in awareness.  Working with energy conservation techniques.  Performed sit to stand rolling walker contact-guard.  Ambulates 175 feet rolling walker contact-guard.  Stand step transfer to bed without assistive device contact-guard and cues for positioning.  ADLs contact-guard assist standing to get clothing over hips as well as standing in bare feet.  Independent overall grooming.  Set up for lower body bathing independent upper body dressing contact-guard lower body dressing close supervision toilet transfers.  Full teaching completed plan discharge to home       Disposition: Discharge to home    Diet: Regular  Special Instructions: No smoking or alcohol  Routine colostomy care  Wound care to left lower buttock full-thickness wound.  Cleanse with soap and water, rinse and pat dry.  Cover with size appropriate piece of silver Hydrofiber  (aqua seal) top with single folded gauze 4 x 4.  Cover/secure with silicone foam dressing.  Change daily.  Encouraged side-lying positioning while in bed, minimize time in the supine position.  Medications at discharge 1.  Tylenol as needed 2.  Vitamin C 500 mg p.o. twice daily 3.  Zebeta 10 mg nightly 4.  Pepcid 20 mg p.o. twice daily 5.  Ferrous sulfate 325 mg p.o. twice daily 6.  Robaxin 1000 mg p.o. 3 times daily 7.  Ativan 0.5-1 mg every 12 hours as needed anxiety 8.  Singulair 10 mg p.o. nightly 9.  Multivitamin daily 10.  Protonix 40 mg p.o. daily 11.  FiberCon 1250 mg p.o. twice daily 12.  Tramadol 50 to 100 mg every 6 hours as needed pain 13.  Primaxin 500 mg every 6 hours  30-35 minutes were spent completing discharge summary and discharge planning    Follow-up Information     Jennye Boroughs, MD Follow up.   Specialty: Physical Medicine and Rehabilitation Why: No formal follow up needed Contact information: [address] [place] [address] [phone]         Mignon Pine, DO Follow up.   Specialties: Infectious Diseases, Internal Medicine Why: call for appointment Contact information: [address] [place] [address] [phone]         Leighton Ruff, MD Follow up.   Specialties: General Surgery, Colon and Rectal Surgery Why: Call for appointment Contact information: [place] [address] [phone]         Suzette Battiest, MD Follow up.   Specialties: Interventional Radiology, Diagnostic Radiology, Radiology Why: Pt will hear from IR scheduler for time/date of follow-up; call [phone] with any questions or concerns; be sure to flush drain 5-10cc daily and record output Contact information: [address] [place] [address] [phone]                 Signed: Lavon Paganini [place] 04/25/2022, 3:50 PM

## 2022-04-21 NOTE — Progress Notes (Signed)
PROGRESS NOTE   Subjective/Complaints: Yesterday had CT guided presacral drain placed, 71m purulent fluid was aspirated.  Reports no fevers last night. Says he feels better this AM. Working with therapy.    Review of Systems  Review of Systems  Constitutional:  Negative for chills and fever.  Respiratory:  Negative for shortness of breath.   Cardiovascular:  Negative for chest pain and palpitations.  Gastrointestinal:  Negative for abdominal pain, nausea and vomiting.  Neurological:  Positive for weakness.    Objective:   CT IMAGE GUIDED DRAINAGE BY PERCUTANEOUS CATHETER  Result Date: 04/20/2022 INDICATION: 71year old male with history of diverting colostomy with persistent and enlarging presacral fluid collection concerning for abscess, currently with inadequate drainage via indwelling surgical drain. EXAM: CT IMAGE GUIDED DRAINAGE BY PERCUTANEOUS CATHETER COMPARISON:  None Available. MEDICATIONS: The patient is currently admitted to the hospital and receiving intravenous antibiotics. The antibiotics were administered within an appropriate time frame prior to the initiation of the procedure. ANESTHESIA/SEDATION: Moderate (conscious) sedation was employed during this procedure. A total of Versed 1.5 mg and Fentanyl 75 mcg was administered intravenously. Moderate Sedation Time: 15 minutes. The patient's level of consciousness and vital signs were monitored continuously by radiology nursing throughout the procedure under my direct supervision. CONTRAST:  None COMPLICATIONS: None immediate. PROCEDURE: RADIATION DOSE REDUCTION: This exam was performed according to the departmental dose-optimization program which includes automated exposure control, adjustment of the mA and/or kV according to patient size and/or use of iterative reconstruction technique. Informed written consent was obtained from the patient after a discussion of the risks,  benefits and alternatives to treatment. The patient was placed in partial left lateral decubitus position on the CT gantry and a pre procedural CT was performed re-demonstrating the known abscess/fluid collection within the presacral region. The procedure was planned. A timeout was performed prior to the initiation of the procedure. The right gluteal region was prepped and draped in the usual sterile fashion. The overlying soft tissues were anesthetized with 1% lidocaine with epinephrine. Appropriate trajectory was planned with the use of a 22 gauge spinal needle. An 18 gauge trocar needle was advanced into the abscess/fluid collection and a short Amplatz super stiff wire was coiled within the collection. Appropriate positioning was confirmed with a limited CT scan. The tract was serially dilated allowing placement of a 12 [place]purpose drainage catheter. Appropriate positioning was confirmed with a limited postprocedural CT scan. Approximately 30 ml of purulent fluid was aspirated. The tube was connected to a bulb suction and sutured in place. A dressing was placed. The patient tolerated the procedure well without immediate post procedural complication. IMPRESSION: Successful CT guided placement of a 169French all purpose drain catheter into the presacral abscess via right transgluteal approach with aspiration of approximately 30 mL of purulent fluid. Samples were sent to the laboratory as requested by the ordering clinical team. DRuthann Cancer MD [facility] Electronically Signed   By: DRuthann CancerM.D.   On: 04/20/2022 17:50   Recent Labs    04/20/22 0436 04/21/22 0458  WBC 10.8* 10.5  HGB 7.5* 7.6*  HCT 23.5* 24.1*  PLT 327 336  No results for input(s): NA, K, CL, CO2, GLUCOSE, BUN, CREATININE, CALCIUM in the last 72 hours.   Intake/Output Summary (Last 24 hours) at 04/21/2022 0755 Last data filed at 04/21/2022 0453 Gross per 24 hour   Intake 640 ml  Output 1085 ml  Net -445 ml      Pressure Injury 04/09/22 Heel Left Deep Tissue Pressure Injury - Purple or maroon localized area of discolored intact skin or blood-filled blister due to damage of underlying soft tissue from pressure and/or shear. (Active)  04/09/22 1315  Location: Heel  Location Orientation: Left  Staging: Deep Tissue Pressure Injury - Purple or maroon localized area of discolored intact skin or blood-filled blister due to damage of underlying soft tissue from pressure and/or shear.  Wound Description (Comments):   Present on Admission: Yes     Pressure Injury 04/09/22 Heel Right Deep Tissue Pressure Injury - Purple or maroon localized area of discolored intact skin or blood-filled blister due to damage of underlying soft tissue from pressure and/or shear. (Active)  04/09/22 1315  Location: Heel  Location Orientation: Right  Staging: Deep Tissue Pressure Injury - Purple or maroon localized area of discolored intact skin or blood-filled blister due to damage of underlying soft tissue from pressure and/or shear.  Wound Description (Comments):   Present on Admission: Yes    Physical Exam: Vital Signs Blood pressure 131/86, pulse 77, temperature 98 F (36.7 C), temperature source Oral, resp. rate 16, height '5\' 9"'$  (1.753 m), weight 75.1 kg, SpO2 99 %.  General: Alert and oriented x3. NAD HEENT: Head, normocephalic, atraumatic, PERRLA, EOMI, sclera aniecteric, oral mucosa pink and moist Neck: supple Chest: normal rate, no increased WOB Abdomen: soft, positive bowel sounds, minimal tenderness,  Colostomy viable with stool present. Comments: Pelvis has JP drains x2 with milky looking drainage Skin: Stoma around colostomy intact.  Buttocks have left wound covered with dressing. Extremities: No clubbing, edema, or cyanosis.  Neuro: Alert and oriented x3, CN II-XII intact, no focal deficits. Psych: pleasant Working with therapy, walking with  RW  Assessment/Plan: 1. Functional deficits which require 3+ hours per day of interdisciplinary therapy in a comprehensive inpatient rehab setting. Physiatrist is providing close team supervision and 24 hour management of active medical problems listed below. Physiatrist and rehab team continue to assess barriers to discharge/monitor patient progress toward functional and medical goals  Care Tool:  Bathing    Body parts bathed by patient: Right arm, Left arm, Chest, Abdomen, Face, Front perineal area, Right upper leg, Left upper leg, Right lower leg, Left lower leg, Buttocks   Body parts bathed by helper: Buttocks, Right lower leg, Left lower leg     Bathing assist Assist Level: Supervision/Verbal cueing     Upper Body Dressing/Undressing Upper body dressing   What is the patient wearing?: Pull over shirt    Upper body assist Assist Level: Independent    Lower Body Dressing/Undressing Lower body dressing      What is the patient wearing?: Incontinence brief     Lower body assist Assist for lower body dressing: Supervision/Verbal cueing     Toileting Toileting    Toileting assist Assist for toileting: Minimal Assistance - Patient > 75% (urinal use- has colostomy with total A for care)     Transfers Chair/bed transfer  Transfers assist     Chair/bed transfer assist level: Supervision/Verbal cueing     Locomotion Ambulation   Ambulation assist      Assist level: Contact Guard/Touching assist Assistive device:  Walker-rolling Max distance: 150'   Walk 10 feet activity   Assist     Assist level: Supervision/Verbal cueing Assistive device: Walker-rolling   Walk 50 feet activity   Assist Walk 50 feet with 2 turns activity did not occur: Safety/medical concerns  Assist level: Supervision/Verbal cueing Assistive device: Walker-rolling    Walk 150 feet activity   Assist Walk 150 feet activity did not occur: Safety/medical concerns  Assist  level: Contact Guard/Touching assist Assistive device: Walker-rolling    Walk 10 feet on uneven surface  activity   Assist Walk 10 feet on uneven surfaces activity did not occur: Safety/medical concerns         Wheelchair     Assist Is the patient using a wheelchair?: Yes Type of Wheelchair: Manual    Wheelchair assist level: Supervision/Verbal cueing Max wheelchair distance: 150    Wheelchair 50 feet with 2 turns activity    Assist        Assist Level: Supervision/Verbal cueing   Wheelchair 150 feet activity     Assist      Assist Level: Supervision/Verbal cueing   Blood pressure 131/86, pulse 77, temperature 98 F (36.7 C), temperature source Oral, resp. rate 16, height '5\' 9"'$  (1.753 m), weight 75.1 kg, SpO2 99 %.   Medical Problem List and Plan: 1. Functional deficits secondary to debility secondary to pelvic abscess.  Status post laparoscopy, abdominal washout drain placement with creation of diverting colostomy 03/20/2022             -patient may shower but incisions/drains should be covered             -ELOS/Goals: 10-14 days modI             -Continue PT/OT             -Expected discharge 04/23/22  -Team conference today 2.  Antithrombotics: -DVT/anticoagulation:  Pharmaceutical: Lovenox.  Venous Doppler negative             -antiplatelet therapy: N/A 5/27 Continue Lovenox 3. Pain Management: Robaxin 1000 mg 3 times daily, tramadol as needed 5/31- Pain continues to be controlled, continue PRN tramadol 6/1 Discussed use of pain meds when needed to stay ahead of pain for therapy 6/5 Not using PRN pain medications, he reports pain is well controlled 6/7 took tramadol last night, says this helped his pain 4. Mood: Ativan as needed as well as trazodone             -antipsychotic agents: N/A 5. Neuropsych: This patient is capable of making decisions on his own behalf. 6. Skin/Wound Care: Routine skin checks with colostomy education 7.  Fluids/Electrolytes/Nutrition: Routine in and outs with follow-up chemistries 5/27 Slightly low sodium (133) and potassium (3.4), continue to trend, consider repleting 5/30 Na up to 135 and K wnl after repletion 6/4 No issues with electrolytes today    Latest Ref Rng & Units 04/16/2022    4:34 AM 04/13/2022    4:20 AM 04/12/2022    6:44 AM  BMP  Glucose 70 - 99 mg/dL  105   98    BUN 8 - 23 mg/dL  10   8    Creatinine 0.61 - 1.24 mg/dL 0.93   1.00   1.00    Sodium 135 - 145 mmol/L  135   132    Potassium 3.5 - 5.1 mmol/L  4.0   3.4    Chloride 98 - 111 mmol/L  103   99  CO2 22 - 32 mmol/L  26   25    Calcium 8.9 - 10.3 mg/dL  8.4   8.2      8.  ID/intra-abdominal infection.  Continue micafungin 100 mg daily as well as Zosyn 3.375 g every 8 hours.  Plan is for 6 weeks starting date 5/16 ending 05/11/2022 and will need repeat CT abdomen/pelvis on left thigh in 1 to 2 weeks -5/27 Pt has event last night on 5/26 of temp 101.6, HR 115, new onset cough, with shortness of breath.  Per night nurse on 5/26-5/27 became diaphoretic around 0000 on 5/27 and drenched the bed with sweat, with no subsequent fevers or episodes of tachycardia. Orders were placed for CBC, BMET, blood cultures x2, CXR, COVID, respiratory panel, and repeat CT abd/pelvis. ID was consulted the evening of 5/26 with recommendation given to continue current regimen of 6 week Zosyn and micafungin based on original plan.  Per [place], CT abdomen/pelvis indicated with febrile event, especially in light that patient's last CT scan was 04/05/22 and he inadvertently lost one of his JP drains on the morning of 04/06/22. -Respiratory panel and COVID negative.  CXR shows Small left pleural effusion with atelectasis at the left lung base. -CT abd/pelvis: demonstrates a complex left retroperitoneal fluid collection, with superior and inferior components that appear to communicate overlying the Gerota's fascia and left psoas musculature. ID to  round on pt on 04/11/22 and will decide next steps. 04/11/22 See ID and Radiology note.  IR was consulted for intra-abdominal fluid collection. Dr. Denna Haggard (IR) has approved patient for aspiration of inferior portion of fluid collection. -Per Radiology note, PA will reach out to General Surgery (Dr. Marcello Moores) to notify of plan. Also, called Dr. Rosendo Gros, MD on-call for General Surgery this weekend to update office on patient since surgery signed off on 04/09/22.  I was unable to reach provider since he was in-procedure.  But relayed message by telephone to nurse, Karena Addison, [place] of patient's febrile episode 04/09/22-04/10/22 and plans for abdominal fluid removal and possible drain placement by IR. 5/29 IR completed aspiration of 7m of purulent material, no drain placed 5/29 ID continuing Zosyn, micafungin 5/31 culture of L flank abscess with GNR and gram pos cocci 6/1 ID changing zosyn to Ceftriaxone and Augmentin, continuing Micafungin, continue to follow 6/2 no fevers overnight, follow 6/5 new fluid collections, ID changed to imipenem, general surgery consult 9.  Postoperative anemia.  Continue iron supplement.  Follow-up CBC 5/28 Hgb 8.2 low but stable, continue to trend and monitor. 5/29 Hgb stable at 8.0, follow 6/6 HGB a little lower today at 7.5, continue to monitor 6/7 HBG 7.6 this AM, stable overall, continue to monitor    Latest Ref Rng & Units 04/21/2022    4:58 AM 04/20/2022    4:36 AM 04/18/2022    9:25 AM  CBC  WBC 4.0 - 10.5 K/uL 10.5   10.8   15.0    Hemoglobin 13.0 - 17.0 g/dL 7.6   7.5   8.2    Hematocrit 39.0 - 52.0 % 24.1   23.5   25.2    Platelets 150 - 400 K/uL 336   327   347     10.  Hypertension.  Continue Zebeta 10 mg nightly,   Monitor with increased mobility 5/20 BP well controlled, continue to monitor, continue zebeta 6/1 Well controlled, follow 6/6 BP well controlled, continue to follow    04/21/2022    4:51 AM 04/20/2022  8:38 PM 04/20/2022    7:55 PM  Vitals with BMI   Systolic [phone]  Diastolic 86 93 84  Pulse 77 83 87   11.  Decreased nutritional storage.  Dietary follow-up. Continue Prosource.  12.  Urinary retention.  Foley tube removed and urinating.  Check PVR 5/27 Pt is voiding okay, reported that he had some urinary incontinence for the first day or two immediately following Foley removal, which has since resolved. 5/31 reports no difficulty voiding, follow  13. Hypokalemia             -5/29KCL today, repeat labs             -5/30 K+ wnl now             6/3 K+, remains wnl's, 4.0, on 5/30 14. Hypoalbuminemia             -Continue ensure supplementation             -Discussed healthy diet, He ate all his breakfast today 5/30 15. Wound at lower buttocks site of previous drain             -5/20 WOC nurse consult recommends aquacel Ag with dry gauze and silicone foam. Continue to follow  -6/6 WOC noted increased R glueal wound drainage yesterday 6/3 Patient reports increased volume of greenish discharge from left buttock wound saturating dressing.  Wound bed appears pinkish red length 3 cm, width 1.5 cm, depth not measured per patient request but appears about 1.5-2 cm.  WBC's normal, afebrile.  Called ID to make awake, spoke with on-call MD Laurice Record, MD.  Michela Pitcher that ID will round and follow-up on 04/18/22. 6/4 Wound site still is producing greenish drainage, ID was notified yesterday and again today with WBC spike of 15.0. ID to round on and follow up on patient. -6/5 CT abdomen/pelv and left femur completed.  May need additional aspiration/drain. ID has seen him and recommended surgical eval. I called surgery christopher white for eval, appreciate assistance -6/6 Surgery ordered IR evaluation, appreciate assistance 6/7 CT Guided aspiration completed yesterday with 81m fluid drained, continue to monitor 16. Asthma,              -Using albuterol inhaler, monitor, improved today             -6/1 Reports breathing continues to be improved               6/4 No reported issues today. 17. Leukocytosis  -6/5 ID adjusted abx to primaxin, continue to follow  -6/6 WBC down to 10.8 today  -6/7 WBC 10.5 today, continue to follow  LOS: 12 days A FACE TO [place]6/05/2022, 7:55 AM

## 2022-04-21 NOTE — Progress Notes (Signed)
Patient ID: Tyler [name], male   DOB: October 02, 1951, 71 y.o.   MRN: [id number]  Met with pt to update regarding team conference goals of supervision-mod/I level and with medical issues and missed therapy session we need to extend his stay until 6/14. Pt feels much better about this now and feels needed extra time due to medical procedures and missed therapy sessions. He feels this will built his confidence and help with how deconditioned he is. Have let home health agency know and IV therapy regarding changed discharge date. Will continue to work on discharge needs.

## 2022-04-21 NOTE — Progress Notes (Signed)
Occupational Therapy Session Note  Patient Details  Name: Tyler [name] MRN: [id number] Date of Birth: 08/01/1951  Today's Date: 04/21/2022 OT Individual Time: 0930-1030 OT Individual Time Calculation (min): 60 min    Short Term Goals: Week 2:  OT Short Term Goal 1 (Week 2): STGs = LTGs  Skilled Therapeutic Interventions/Progress Updates:    Pt received in w/c ready for therapy.  He had just finished grooming at sink.  Pt requesting to wash his hair.  Obtained shampoo tray, hair dryer. Pt assisted with washing his hair and then he used hair dryer.    Pt told me he was able to get his shorts, socks, and shoes on himself today!  Pt worked on w/c push ups 10x and then became very fatigued.  Rested for awhile then did 5 sit to stands to a stand at [facility] for 1 min with overhead arm stretches.  He did start to feel dizzy. Sat to rest for a few minutes and then repeated exercises. He was dizzy again so moved to seated arm exercises with 3# dowel bar. Pt surprised with how fatigued he gets and discussed his concerns about leaving on Friday.  Discussed how he did miss a few days of therapy due to procedures.  Will talk with team about extending LOS.  Pt was feeling extremely fatigued. Requesting to get back to bed. Supervision to bed. Pt in bed with all needs met.   Therapy Documentation Precautions:  Precautions Precautions: Fall Precaution Comments: drains, colostomy, rectal drainage Restrictions Weight Bearing Restrictions: No      Pain: Pain Assessment Pain Score: 0-No pain ADL: ADL Eating: Independent Where Assessed-Eating: Wheelchair Grooming: Independent Where Assessed-Grooming: Wheelchair Upper Body Bathing: Independent Where Assessed-Upper Body Bathing: Wheelchair Lower Body Bathing: Setup Where Assessed-Lower Body Bathing: Wheelchair Upper Body Dressing: Independent Where Assessed-Upper Body Dressing: Wheelchair Lower Body Dressing: Supervision/safety Where  Assessed-Lower Body Dressing: Wheelchair Toileting: Supervision/safety Where Assessed-Toileting: Toilet (with empty ostomy bag at toilet) Toilet Transfer: Close supervision Toilet Transfer Method: Counselling psychologist: Energy manager: Not assessed Social research officer, government:  (unable to shower at this time due to PICC line) ADL Comments: pt can now don clothing and shoes without AE      Therapy/Group: Individual Therapy  [place] 04/21/2022, 11:53 AM

## 2022-04-21 NOTE — Progress Notes (Signed)
Physical Therapy Session Note  Patient Details  Name: Tyler [name] MRN: [id number] Date of Birth: [birth year]  Today's Date: 04/21/2022 PT Individual Time: 0805-0901 PT Individual Time Calculation (min): 56 min   Short Term Goals: Week 2:  PT Short Term Goal 1 (Week 2): = LTGS due to LOS PT Short Term Goal 2 (Week 2): =LTGs due to LOS PT Short Term Goal 3 (Week 2): =LTGs due to LOS PT Short Term Goal 4 (Week 2): =LTGs due to LOS  Skilled Therapeutic Interventions/Progress Updates: Pt presented in bed agreeable to therapy. Pt states feels somewhat rested and better from procedure yesterday. Pt states some soreness in buttock but did not rate. Pt was able to don socks in supine with set up and increased time due to L hip tightness requiring increased effort to position into figure four position. Pt then performed supine to sit with supervisin and use of bed features. Pt was able to don shorts EOB in same manner as socks then donned shoes with set up and increased time prior to standing without AD to pull pants over hips. Upon return to sitting pt provided with RW and once IV path cleared pt ambulated to rehab gym >130f with RW and supervision with w/c follow for safety. In gym pt participated in Sit to stand from elevated height x 3 without AD and without use of BUE. Pt also participated in toe taps to 6in step without AD 2 x 4. Pt required extensive encouragement to participate in such tasks due to anxiety however pt demonstrated overall good safety with activities and able to perform without LOB. Pt also participated in ball taps with 2lb dowel 2 x 10 for general conditioning. After seated rest pt ambulated back to room in same manner as prior and after seated rest ambulated to sink to perform oral hygiene in standing (approx 2 min) pt then sat at w/c and requested to remain at sink to complete shaving task. Pt left at sink with current needs met and nsg notified of pt's disposition.       Therapy Documentation Precautions:  Precautions Precautions: Fall Precaution Comments: drains, colostomy, rectal drainage Restrictions Weight Bearing Restrictions: No General:   Vital Signs: Therapy Vitals Temp: 98.3 F (36.8 C) Temp Source: Oral Pulse Rate: 88 Resp: 16 BP: 120/77 Patient Position (if appropriate): Lying Oxygen Therapy SpO2: 98 % O2 Device: Room Air Pain:   Mobility:   Locomotion :    Trunk/Postural Assessment :    Balance:   Exercises:   Other Treatments:      Therapy/Group: Individual Therapy  Santiago Stenzel 04/21/2022, 4:07 PM

## 2022-04-21 NOTE — Progress Notes (Signed)
Physical Therapy Session Note  Patient Details  Name: Tyler [name] MRN: [id number] Date of Birth: 1951-07-13  Today's Date: 04/21/2022 PT Individual Time: 6314-9702 PT Individual Time Calculation (min): 27 min   Short Term Goals: Week 2:  PT Short Term Goal 1 (Week 2): = LTGS due to LOS PT Short Term Goal 2 (Week 2): =LTGs due to LOS PT Short Term Goal 3 (Week 2): =LTGs due to LOS PT Short Term Goal 4 (Week 2): =LTGs due to LOS  Skilled Therapeutic Interventions/Progress Updates:    Pt received supine in bed reporting feeling "drowsy" due to "sleeping med" administration last night but despite this, is agreeable to therapy session. Supine>sitting R EOB, HOB partially elevated, with increased time and supervision for safety - no cuing needed. Sit<>stands using RW with supervision for safety throughout session. Gait training ~128f x2 to/from main therapy gym using RW with supervision and therapist providing w/c follow in event of fatigue per pt request - pt demos slow but adequate gait speed achieving reciprocal stepping pattern. Stair navigation training ascending/descending 4 (6" height) steps using B HRs with CGA for safety at this time and pt self-selecting step-to pattern. After seated rest break, completed B LE strengthening via repeated alternating stepping up/down on/off 1st 6" step using B HRs x10 reps then x5 reps with CGA - pt demos weakness in L LE compared to R LE with more difficulty powering up onto step and eccentric control when stepping down. At end of session, pt requesting to return to bed. Sit>supine with supervision. Pt left supine in bed with needs in reach, lines intact, and bed alarm on.  Therapy Documentation Precautions:  Precautions Precautions: Fall Precaution Comments: drains, colostomy, rectal drainage Restrictions Weight Bearing Restrictions: No   Pain:  Reports has had increased pain on buttocks where the drain was placed yesterday; however, no  intervention requested at this time and seated rest breaks provided for fatigue management during session.    Therapy/Group: Individual Therapy  CTawana Scale, PT, DPT, NCS, CSRS 04/21/2022, 8:01 AM

## 2022-04-22 DIAGNOSIS — I10 Essential (primary) hypertension: Secondary | ICD-10-CM

## 2022-04-22 DIAGNOSIS — M25559 Pain in unspecified hip: Secondary | ICD-10-CM

## 2022-04-22 DIAGNOSIS — D649 Anemia, unspecified: Secondary | ICD-10-CM

## 2022-04-22 NOTE — Consult Note (Signed)
[place] Nurse ostomy follow up Stoma type/location: LLQ colostomy.  Patient had new drains placed today and is too tired to try and perform pouch  Stomal assessment/size: 1 1/2"  Peristomal assessment: intact , creasing remains, requires 2 barrier rings Treatment options for stomal/peristomal skin: convex pouch barrier rings Output soft brown stool. Pouch is nearly full when I arrive today.   Ostomy pouching: 1pc.convex  Education provided: Patient can perform pouch change, is in pain and unable to participate.  [place] Nurse wound follow up Wound type:infectious, drainage decreased since drains placed.  Measurement:2 cm x 1 cm x 3 cm  Wound YNX:GZFP pink Drainage (amount, consistency, odor) minimal purulence today. Patient verbalizes this is much better since drains were placed.  Periwound:intact Dressing procedure/placement/frequency:no change.  Will follow.  Domenic Moras MSN, RN, FNP-BC [facility]ON Wound, Ostomy, Continence Nurse Pager [phone]

## 2022-04-22 NOTE — Progress Notes (Signed)
Referring Physician(s): Dr. 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   Most recent surgery on 03/20/22 abd washout and drain placement and creation of diverting colostomy.    IR interventions:  S/p left posterior thigh intramuscular abscess on 5/17 - Dr. Serafina Royals  S/p aspiration of left lower quadrant abscess on 5/29 - Dr. Denna Haggard S/p right TG drain placement by Dr. Serafina Royals on 04/20/22 - Dr. Serafina Royals   Subjective:  Sitting up in bed. No new complaints  Allergies: Fluconazole, Griseofulvin, Sulfa antibiotics, Oxycodone, Mometasone furo-formoterol fum, and Retapamulin  Medications: Prior to Admission medications   Medication Sig Start Date End Date Taking? Authorizing Provider  acetaminophen (TYLENOL) 500 MG tablet Take 2 tablets (1,000 mg total) by mouth every 6 (six) hours as needed for mild pain. 6/60/63   Leighton Ruff, MD  albuterol (VENTOLIN HFA) 108 (90 Base) MCG/ACT inhaler Inhale 2 puffs into the lungs every 6 (six) hours as needed for wheezing or shortness of breath.    [provider]  amLODipine (NORVASC) 5 MG tablet TAKE 1 TABLET BY MOUTH DAILY Patient taking differently: Take 5 mg by mouth in the morning. 04/29/14   Ricard Dillon, MD  ascorbic acid (VITAMIN C) 500 MG tablet Take 1 tablet (500 mg total) by mouth 2 (two) times daily. 0/16/01   Leighton Ruff, MD  azelastine (ASTELIN) 0.1 % nasal spray Place 1 spray into both nostrils 2 (two) times daily as needed for rhinitis. Use in each nostril as directed    [provider]  bisoprolol (ZEBETA) 10 MG tablet Take 10 mg by mouth at bedtime.    [provider]  diphenhydrAMINE (BENADRYL) 25 mg capsule Take 1 capsule (25 mg total) by mouth every  6 (six) hours as needed for itching, allergies or sleep (anxiety). 0/93/23   Leighton Ruff, MD  feeding supplement (ENSURE ENLIVE / ENSURE PLUS) LIQD Take 237 mLs by mouth 3 (three) times daily between meals. 5/57/32   Leighton Ruff, MD  ferrous sulfate 325 (65 FE) MG tablet Take 1 tablet (325 mg total) by mouth 2 (two) times daily with a meal. 12/17/52   Leighton Ruff, MD  ibuprofen (ADVIL) 400 MG tablet Take 1-2 tablets (400-800 mg total) by mouth every 8 (eight) hours as needed for fever, mild pain or cramping. 2/70/62   Leighton Ruff, MD  loperamide (IMODIUM) 2 MG capsule Take 1 capsule (2 mg total) by mouth with breakfast, with lunch, and with evening meal. 3/76/28   Leighton Ruff, MD  methocarbamol 1000 MG TABS Take 1,000 mg by mouth every 8 (eight) hours as needed for muscle spasms. 01/28/16   Leighton Ruff, MD  micafungin in sodium chloride 0.9 % 100 mL '100mg'$  IV q24h 04/30/06   Leighton Ruff, MD  montelukast (SINGULAIR) 10 MG tablet TAKE 1 TABLET BY MOUTH EVERY NIGHT AT BEDTIME Patient taking differently: Take 10 mg by mouth at bedtime. 09/10/13   Ricard Dillon, MD  Multiple Vitamin (MULTIVITAMIN WITH MINERALS) TABS tablet Take 1 tablet by mouth daily. 3/71/06   Leighton Ruff, MD  NEXIUM 26RS 20 MG capsule Take 20 mg by mouth daily before breakfast.    [provider]  ondansetron (ZOFRAN-ODT) 4 MG disintegrating tablet Take 1 tablet (4 mg total) by mouth every 6 (  six) hours as needed for nausea. 05/23/61   Leighton Ruff, MD  piperacillin-tazobactam (ZOSYN) 3.375 GM/50ML IVPB Inject 50 mLs (3.375 g total) into the vein every 8 (eight) hours. 8/36/62   Leighton Ruff, MD  polycarbophil (FIBERCON) 625 MG tablet Take 1 tablet (625 mg total) by mouth 2 (two) times daily. 9/47/65   Leighton Ruff, MD  sodium chloride flush (NS) 0.9 % SOLN 10-40 mLs by Intracatheter route every 12 (twelve) hours. 4/65/03   Leighton Ruff, MD  sodium chloride flush (NS) 0.9 % SOLN 10-40 mLs by  Intracatheter route as needed (flush). 5/46/56   Leighton Ruff, MD  tadalafil (CIALIS) 10 MG tablet Take 10 mg by mouth daily as needed for erectile dysfunction.    [provider]  traMADol (ULTRAM) 50 MG tablet Take 1-2 tablets (50-100 mg total) by mouth every 6 (six) hours as needed for moderate pain or severe pain. 06/26/74   Leighton Ruff, MD  traZODone (DESYREL) 50 MG tablet Take 1 tablet (50 mg total) by mouth at bedtime as needed for sleep. 1/70/01   Leighton Ruff, MD  valACYclovir (VALTREX) 500 MG tablet Take 500 mg by mouth in the morning.    [provider]     Vital Signs: BP 136/85 (BP Location: Left Arm)   Pulse 75   Temp 97.9 F (36.6 C)   Resp 20   Ht '5\' 9"'$  (1.753 m)   Wt 165 lb 9.1 oz (75.1 kg)   SpO2 97%   BMI 24.45 kg/m   Physical Exam Constitutional:      Appearance: Normal appearance.  HENT:     Head: Normocephalic and atraumatic.  Cardiovascular:     Rate and Rhythm: Normal rate.  Pulmonary:     Effort: Pulmonary effort is normal. No respiratory distress.  Skin:    General: Skin is warm and dry.  Neurological:     General: No focal deficit present.     Mental Status: He is alert and oriented to person, place, and time.  Psychiatric:        Mood and Affect: Mood normal.        Behavior: Behavior normal.        Thought Content: Thought content normal.        Judgment: Judgment normal.    Drain Location: Transgluteal Size: Fr size: 12 Fr Date of placement: 04/20/22  Currently to: Drain collection device: suction bulb 24 hour output:  Output by Drain (mL) 04/20/22 0701 - 04/20/22 1900 04/20/22 1901 - 04/21/22 0700 04/21/22 0701 - 04/21/22 1900 04/21/22 1901 - 04/22/22 0700 04/22/22 0701 - 04/22/22 1220  Closed System Drain 1 Lateral;Right RLQ Bulb (JP) 19 Fr. '20  15 5   '$ Closed System Drain 2 Lateral;Left LLQ Bulb (JP) 19 Fr. 95  '30 25 30  '$ Closed System Drain 3 Inferior;Right Back Bulb (JP) 12 Fr. '20  30 20    '$ Current  examination: Flushes/aspirates easily.  Cloudy tan fluid in bulb Insertion site unremarkable. Suture and stat lock in place. Dressed appropriately.   Imaging: CT IMAGE GUIDED DRAINAGE BY PERCUTANEOUS CATHETER  Result Date: 04/20/2022 INDICATION: 71 year old male with history of diverting colostomy with persistent and enlarging presacral fluid collection concerning for abscess, currently with inadequate drainage via indwelling surgical drain. EXAM: CT IMAGE GUIDED DRAINAGE BY PERCUTANEOUS CATHETER COMPARISON:  None Available. MEDICATIONS: The patient is currently admitted to the hospital and receiving intravenous antibiotics. The antibiotics were administered within an appropriate time frame prior to the initiation of the  procedure. ANESTHESIA/SEDATION: Moderate (conscious) sedation was employed during this procedure. A total of Versed 1.5 mg and Fentanyl 75 mcg was administered intravenously. Moderate Sedation Time: 15 minutes. The patient's level of consciousness and vital signs were monitored continuously by radiology nursing throughout the procedure under my direct supervision. CONTRAST:  None COMPLICATIONS: None immediate. PROCEDURE: RADIATION DOSE REDUCTION: This exam was performed according to the departmental dose-optimization program which includes automated exposure control, adjustment of the mA and/or kV according to patient size and/or use of iterative reconstruction technique. Informed written consent was obtained from the patient after a discussion of the risks, benefits and alternatives to treatment. The patient was placed in partial left lateral decubitus position on the CT gantry and a pre procedural CT was performed re-demonstrating the known abscess/fluid collection within the presacral region. The procedure was planned. A timeout was performed prior to the initiation of the procedure. The right gluteal region was prepped and draped in the usual sterile fashion. The overlying soft tissues  were anesthetized with 1% lidocaine with epinephrine. Appropriate trajectory was planned with the use of a 22 gauge spinal needle. An 18 gauge trocar needle was advanced into the abscess/fluid collection and a short Amplatz super stiff wire was coiled within the collection. Appropriate positioning was confirmed with a limited CT scan. The tract was serially dilated allowing placement of a 12 [place] all-purpose drainage catheter. Appropriate positioning was confirmed with a limited postprocedural CT scan. Approximately 30 ml of purulent fluid was aspirated. The tube was connected to a bulb suction and sutured in place. A dressing was placed. The patient tolerated the procedure well without immediate post procedural complication. IMPRESSION: Successful CT guided placement of a 73 French all purpose drain catheter into the presacral abscess via right transgluteal approach with aspiration of approximately 30 mL of purulent fluid. Samples were sent to the laboratory as requested by the ordering clinical team. Ruthann Cancer, MD [facility] Radiology Electronically Signed   By: Ruthann Cancer M.D.   On: 04/20/2022 17:50   CT FEMUR LEFT W CONTRAST  Result Date: 04/18/2022 CLINICAL DATA:  Soft tissue infection suspected, thigh, xray done EXAM: CT OF THE LOWER LEFT EXTREMITY WITH CONTRAST TECHNIQUE: Multidetector CT imaging of the lower left extremity was performed according to the standard protocol following intravenous contrast administration. RADIATION DOSE REDUCTION: This exam was performed according to the departmental dose-optimization program which includes automated exposure control, adjustment of the mA and/or kV according to patient size and/or use of iterative reconstruction technique. CONTRAST:  148m OMNIPAQUE IOHEXOL 300 MG/ML  SOLN COMPARISON:  Limited MRI 03/29/2022, included portions from prior abdominopelvic CTs. FINDINGS: Bones/Joint/Cartilage No fracture.  No periosteal reaction, bone destruction, or erosions. Hip and knee alignment are maintained. Ligaments Suboptimally assessed by CT. Muscles and Tendons Left obturator fluid collection measures approximately 3.1 x 1.4 cm slightly diminished from prior abdominal CT 04/10/2022. There are multiple thin elongated fluid collections within left abductor muscle. Proximally this is seen on series 1, image 42 and series 6, image 25, spanning approximately 5.5 cm in length, subcentimeter in thickness. In the midportion this is seen on image 161 and series 6, image 27, spanning 4.6 cm in length. Distal collection measures 1.6 x 1.1 x 4.6 cm, series 1, image 74 and series 6, image 24, this collection is peripherally enhancing and thick walled. Soft tissues Mild soft tissue edema.  No subcutaneous collection. IMPRESSION: 1. Multiple thin elongated fluid collections within the left abductor muscle, suspicious for  abscesses. The largest of these measures 1.6 x 1.1 x 4.6 cm distally, thick-walled and peripherally enhancing. 2. Left obturator fluid collection measures 3.1 x 1.4 cm, slightly diminished from prior abdominal CT 04/10/2022. Electronically Signed   By: Keith Rake M.D.   On: 04/18/2022 21:03   CT ABDOMEN PELVIS W CONTRAST  Result Date: 04/18/2022 CLINICAL DATA:  Follow-up intra-abdominal abscess. EXAM: CT ABDOMEN AND PELVIS WITH CONTRAST TECHNIQUE: Multidetector CT imaging of the abdomen and pelvis was performed using the standard protocol following bolus administration of intravenous contrast. RADIATION DOSE REDUCTION: This exam was performed according to the departmental dose-optimization program which includes automated exposure control, adjustment of the mA and/or kV according to patient size and/or use of iterative reconstruction technique. CONTRAST:  157m OMNIPAQUE IOHEXOL 300 MG/ML  SOLN COMPARISON:  Most recent CT 04/10/2022 FINDINGS: Lower chest: Decreased size of left pleural effusion with only trace  residual. Associated atelectasis. Hepatobiliary: Punctate hepatic granuloma. No suspicious liver lesion. Gallbladder physiologically distended, no calcified stone. No biliary dilatation. Pancreas: No ductal dilatation or inflammation. Spleen: Mildly enlarged spanning 14.2 cm cranial caudal. No focal splenic abnormality. Small amount of perisplenic fluid adjacent to the inferior pole. Adrenals/Urinary Tract: No adrenal nodule. Previous anterior perirenal fluid collection no abuts the upper pole of the left kidney. There is no hydronephrosis. Small focus of air in the urinary bladder is likely related to prior instrumentation. No bladder wall thickening. Stomach/Bowel: Decompressed stomach. Few prominent fluid-filled loops of small bowel in the upper abdomen, no obstruction. Enteric contrast reaches the colon. Left lower quadrant colostomy. Stapled off sigmoid colon. Vascular/Lymphatic: Aortic atherosclerosis. Patent portal, splenic, and superior mesenteric veins. No bulky abdominopelvic adenopathy. Reproductive: Unremarkable. Other: Drainage catheter in the pelvis is looped within a presacral air-fluid fluid collection. This measures approximately 8.4 x 3.4 cm, series 3, image 76, previously 7.9 x 2.5 cm. There is an increased fluid component compared to prior exam. U shaped collections emanating anteriorly from this dominant presacral collection persist, coursing lateral to the bladder, series 3, image 73. These may encircle the bladder and communicate anteriorly, uncertain. These collections measure approximately 1 cm in thickness. The left retroperitoneal collection has slightly diminished, currently 3.1 x 1.4 cm, series 3, image 58, previously 4.7 x 1.6 cm. Left anterior perirenal collection persists, diminished in size currently 2.3 x 1.6 cm, series 3, image 36, previously 3.2 x 2.3 cm. The previous potential connection between these 2 fluid collections has diminished. Surgical drain located within the left  anterior abdominal wall musculature has no associated fluid collection. Musculoskeletal: Intramuscular collection within the left obturator musculature measures 3 x 1.6 cm, series 3, image 87, previously 3.2 x 1.8 cm. Stable osseous structures. Stable right thigh intramuscular lipoma IMPRESSION: 1. Drainage catheter in the pelvis is looped within a presacral air-fluid collection. This measures approximately 8.4 x 3.4 cm, previously 7.9 x 2.5 cm. There is an increased fluid component compared to prior exam. U shaped collections emanating anteriorly from this dominant presacral collection coursing lateral to the bladder, 1 cm in thickness. These may encircle the bladder and communicate anteriorly, uncertain. 2. The left retroperitoneal collections have slightly diminished in size, lower quadrant collection currently measures 3.1 x 1.4 cm, anterior pararenal collection 2.3 x 1.6 cm. The previous potential connection between these 2 fluid collections has diminished. 3. Intramuscular collection within the left obturator musculature measures 3 x 1.6 cm, previously 3.2 x 1.8 cm. 4. Decreased size of left pleural effusion with only trace residual. 5. Mild splenomegaly. Aortic  Atherosclerosis (ICD10-I70.0). Electronically Signed   By: Keith Rake M.D.   On: 04/18/2022 20:57    Labs:  CBC: Recent Labs    04/15/22 0936 04/18/22 0925 04/20/22 0436 04/21/22 0458  WBC 9.4 15.0* 10.8* 10.5  HGB 8.1* 8.2* 7.5* 7.6*  HCT 24.3* 25.2* 23.5* 24.1*  PLT 304 347 327 336    COAGS: Recent Labs    03/18/22 0617 04/12/22 0644 04/20/22 1521  INR 1.4* 1.2 1.2    BMP: Recent Labs    03/23/22 0742 03/23/22 0904 04/04/22 2333 04/09/22 1925 04/10/22 0250 04/12/22 0644 04/13/22 0420 04/16/22 0434  NA QUESTIONABLE RESULTS, RECOMMEND RECOLLECT TO VERIFY   < > 134*  --  133* 132* 135  --   K QUESTIONABLE RESULTS, RECOMMEND RECOLLECT TO VERIFY   < > 3.6  --  3.4* 3.4* 4.0  --   CL QUESTIONABLE RESULTS,  RECOMMEND RECOLLECT TO VERIFY   < > 107  --  100 99 103  --   CO2 QUESTIONABLE RESULTS, RECOMMEND RECOLLECT TO VERIFY   < > 21*  --  '24 25 26  '$ --   GLUCOSE QUESTIONABLE RESULTS, RECOMMEND RECOLLECT TO VERIFY   < > 113*  --  101* 98 105*  --   BUN QUESTIONABLE RESULTS, RECOMMEND RECOLLECT TO VERIFY   < > 24*  --  '12 8 10  '$ --   CALCIUM QUESTIONABLE RESULTS, RECOMMEND RECOLLECT TO VERIFY   < > 7.7*  --  8.3* 8.2* 8.4*  --   CREATININE QUESTIONABLE RESULTS, RECOMMEND RECOLLECT TO VERIFY   < > 0.91   < > 0.92 1.00 1.00 0.93  GFRNONAA QUESTIONABLE RESULTS, RECOMMEND RECOLLECT TO VERIFY   < > >60   < > >60 >60 >60 >60  GFRAA QUESTIONABLE RESULTS, RECOMMEND RECOLLECT TO VERIFY  --   --   --   --   --   --   --    < > = values in this interval not displayed.    LIVER FUNCTION TESTS: Recent Labs    04/04/22 2333 04/08/22 0502 04/12/22 0644 04/13/22 0420  BILITOT 0.8 0.8 0.4 0.5  AST 51* '26 20 28  '$ ALT 55* 33 28 36  ALKPHOS 436* 357* 309* 287*  PROT 6.3* 6.6 6.3* 6.3*  ALBUMIN 1.9* 2.0* 1.9* 1.9*    Assessment and Plan:  S/p right TG drain placement by Dr. Serafina Royals on 04/20/22.    Continue flushes as ordered and routine drain care.  Discharge planning: Please contact IR APP or on call IR MD prior to patient d/c to ensure appropriate follow up plans are in place. Typically patient will follow up with IR clinic 10-14 days post d/c for repeat imaging/possible drain injection. IR scheduler will contact patient with date/time of appointment. Patient will need to flush drain QD with 5 cc NS, record output QD, dressing changes every 2-3 days or earlier if soiled.    IR will continue to follow - please call with questions or concerns.  Electronically Signed: Murrell Redden, PA-C 04/22/2022, 12:17 PM    I spent a total of 15 Minutes at the the patient's bedside AND on the patient's hospital floor or unit, greater than 50% of which was counseling/coordinating care for Follow up drain.

## 2022-04-22 NOTE — Progress Notes (Signed)
PROGRESS NOTE   Subjective/Complaints: Pt in bed this AM. Buttocks wound drainage is improved. No new concerns.    Review of Systems  Review of Systems  Constitutional:  Negative for chills and fever.  Respiratory:  Negative for shortness of breath.   Cardiovascular:  Negative for chest pain and palpitations.  Gastrointestinal:  Negative for abdominal pain, nausea and vomiting.  Neurological:  Positive for weakness.    Objective:   CT IMAGE GUIDED DRAINAGE BY PERCUTANEOUS CATHETER  Result Date: 04/20/2022 INDICATION: 71 year old male with history of diverting colostomy with persistent and enlarging presacral fluid collection concerning for abscess, currently with inadequate drainage via indwelling surgical drain. EXAM: CT IMAGE GUIDED DRAINAGE BY PERCUTANEOUS CATHETER COMPARISON:  None Available. MEDICATIONS: The patient is currently admitted to the hospital and receiving intravenous antibiotics. The antibiotics were administered within an appropriate time frame prior to the initiation of the procedure. ANESTHESIA/SEDATION: Moderate (conscious) sedation was employed during this procedure. A total of Versed 1.5 mg and Fentanyl 75 mcg was administered intravenously. Moderate Sedation Time: 15 minutes. The patient's level of consciousness and vital signs were monitored continuously by radiology nursing throughout the procedure under my direct supervision. CONTRAST:  None COMPLICATIONS: None immediate. PROCEDURE: RADIATION DOSE REDUCTION: This exam was performed according to the departmental dose-optimization program which includes automated exposure control, adjustment of the mA and/or kV according to patient size and/or use of iterative reconstruction technique. Informed written consent was obtained from the patient after a discussion of the risks, benefits and alternatives to treatment. The patient was placed in partial left lateral decubitus  position on the CT gantry and a pre procedural CT was performed re-demonstrating the known abscess/fluid collection within the presacral region. The procedure was planned. A timeout was performed prior to the initiation of the procedure. The right gluteal region was prepped and draped in the usual sterile fashion. The overlying soft tissues were anesthetized with 1% lidocaine with epinephrine. Appropriate trajectory was planned with the use of a 22 gauge spinal needle. An 18 gauge trocar needle was advanced into the abscess/fluid collection and a short Amplatz super stiff wire was coiled within the collection. Appropriate positioning was confirmed with a limited CT scan. The tract was serially dilated allowing placement of a 12 [place] all-purpose drainage catheter. Appropriate positioning was confirmed with a limited postprocedural CT scan. Approximately 30 ml of purulent fluid was aspirated. The tube was connected to a bulb suction and sutured in place. A dressing was placed. The patient tolerated the procedure well without immediate post procedural complication. IMPRESSION: Successful CT guided placement of a 66 French all purpose drain catheter into the presacral abscess via right transgluteal approach with aspiration of approximately 30 mL of purulent fluid. Samples were sent to the laboratory as requested by the ordering clinical team. Ruthann Cancer, MD [facility] Radiology Electronically Signed   By: Ruthann Cancer M.D.   On: 04/20/2022 17:50   Recent Labs    04/20/22 0436 04/21/22 0458  WBC 10.8* 10.5  HGB 7.5* 7.6*  HCT 23.5* 24.1*  PLT 327 336    No results for input(s): "NA", "K", "CL", "CO2", "GLUCOSE", "BUN", "CREATININE", "CALCIUM"  in the last 72 hours.   Intake/Output Summary (Last 24 hours) at 04/22/2022 0803 Last data filed at 04/22/2022 2297 Gross per 24 hour  Intake 720 ml  Output 1275 ml  Net -555 ml      Pressure Injury 04/09/22  Heel Left Deep Tissue Pressure Injury - Purple or maroon localized area of discolored intact skin or blood-filled blister due to damage of underlying soft tissue from pressure and/or shear. (Active)  04/09/22 1315  Location: Heel  Location Orientation: Left  Staging: Deep Tissue Pressure Injury - Purple or maroon localized area of discolored intact skin or blood-filled blister due to damage of underlying soft tissue from pressure and/or shear.  Wound Description (Comments):   Present on Admission: Yes     Pressure Injury 04/09/22 Heel Right Deep Tissue Pressure Injury - Purple or maroon localized area of discolored intact skin or blood-filled blister due to damage of underlying soft tissue from pressure and/or shear. (Active)  04/09/22 1315  Location: Heel  Location Orientation: Right  Staging: Deep Tissue Pressure Injury - Purple or maroon localized area of discolored intact skin or blood-filled blister due to damage of underlying soft tissue from pressure and/or shear.  Wound Description (Comments):   Present on Admission: Yes    Physical Exam: Vital Signs Blood pressure 136/85, pulse 75, temperature 97.9 F (36.6 C), resp. rate 20, height '5\' 9"'$  (1.753 m), weight 75.1 kg, SpO2 97 %.  General: Alert and oriented x3. NAD HEENT: Head, normocephalic, atraumatic, PERRLA, EOMI, sclera aniecteric, oral mucosa pink and moist Neck: supple Chest: normal rate, no increased WOB Abdomen: soft, positive bowel sounds, minimal tenderness,  Colostomy viable with stool present. Comments:  JP drains 2 milky looking drainage, sub q drain with small amount of serous drainage Skin: Stoma around colostomy intact.  Buttocks have left wound covered with dressing. Heels without open wounds or redness this AM Extremities: No clubbing, edema, or cyanosis.  Neuro: Alert and oriented x3, CN II-XII intact, no focal deficits. Psych: pleasant   Assessment/Plan: 1. Functional deficits which require 3+ hours per  day of interdisciplinary therapy in a comprehensive inpatient rehab setting. Physiatrist is providing close team supervision and 24 hour management of active medical problems listed below. Physiatrist and rehab team continue to assess barriers to discharge/monitor patient progress toward functional and medical goals  Care Tool:  Bathing    Body parts bathed by patient: Right arm, Left arm, Chest, Abdomen, Face, Front perineal area, Right upper leg, Left upper leg, Right lower leg, Left lower leg, Buttocks   Body parts bathed by helper: Buttocks, Right lower leg, Left lower leg     Bathing assist Assist Level: Supervision/Verbal cueing     Upper Body Dressing/Undressing Upper body dressing   What is the patient wearing?: Pull over shirt    Upper body assist Assist Level: Independent    Lower Body Dressing/Undressing Lower body dressing      What is the patient wearing?: Incontinence brief     Lower body assist Assist for lower body dressing: Supervision/Verbal cueing     Toileting Toileting    Toileting assist Assist for toileting: Minimal Assistance - Patient > 75% (urinal use- has colostomy with total A for care)     Transfers Chair/bed transfer  Transfers assist     Chair/bed transfer assist level: Supervision/Verbal cueing     Locomotion Ambulation   Ambulation assist      Assist level: Contact Guard/Touching assist Assistive device: Walker-rolling Max distance: 150'  Walk 10 feet activity   Assist     Assist level: Supervision/Verbal cueing Assistive device: Walker-rolling   Walk 50 feet activity   Assist Walk 50 feet with 2 turns activity did not occur: Safety/medical concerns  Assist level: Supervision/Verbal cueing Assistive device: Walker-rolling    Walk 150 feet activity   Assist Walk 150 feet activity did not occur: Safety/medical concerns  Assist level: Contact Guard/Touching assist Assistive device: Walker-rolling     Walk 10 feet on uneven surface  activity   Assist Walk 10 feet on uneven surfaces activity did not occur: Safety/medical concerns         Wheelchair     Assist Is the patient using a wheelchair?: Yes Type of Wheelchair: Manual    Wheelchair assist level: Supervision/Verbal cueing Max wheelchair distance: 150    Wheelchair 50 feet with 2 turns activity    Assist        Assist Level: Supervision/Verbal cueing   Wheelchair 150 feet activity     Assist      Assist Level: Supervision/Verbal cueing   Blood pressure 136/85, pulse 75, temperature 97.9 F (36.6 C), resp. rate 20, height '5\' 9"'$  (1.753 m), weight 75.1 kg, SpO2 97 %.   Medical Problem List and Plan: 1. Functional deficits secondary to debility secondary to pelvic abscess.  Status post laparoscopy, abdominal washout drain placement with creation of diverting colostomy 03/20/2022             -patient may shower but incisions/drains should be covered             -ELOS/Goals: 10-14 days modI             -Continue PT/OT  -Team conference yesterday, Expected discharge 6/14 2.  Antithrombotics: -DVT/anticoagulation:  Pharmaceutical: Lovenox.  Venous Doppler negative             -antiplatelet therapy: N/A 5/27 Continue Lovenox 3. Pain Management: Robaxin 1000 mg 3 times daily, tramadol as needed 5/31- Pain continues to be controlled, continue PRN tramadol 6/1 Discussed use of pain meds when needed to stay ahead of pain for therapy 6/5 Not using PRN pain medications, he reports pain is well controlled 6/8 He reports PRN tramadol helping to keep pain under control, helping with sleep and therapy tolerance 4. Mood: Ativan as needed as well as trazodone             -antipsychotic agents: N/A 5. Neuropsych: This patient is capable of making decisions on his own behalf. 6. Skin/Wound Care: Routine skin checks with colostomy education 7. Fluids/Electrolytes/Nutrition: Routine in and outs with follow-up  chemistries 5/27 Slightly low sodium (133) and potassium (3.4), continue to trend, consider repleting 5/30 Na up to 135 and K wnl after repletion 6/4 No issues with electrolytes today    Latest Ref Rng & Units 04/16/2022    4:34 AM 04/13/2022    4:20 AM 04/12/2022    6:44 AM  BMP  Glucose 70 - 99 mg/dL  105  98   BUN 8 - 23 mg/dL  10  8   Creatinine 0.61 - 1.24 mg/dL 0.93  1.00  1.00   Sodium 135 - 145 mmol/L  135  132   Potassium 3.5 - 5.1 mmol/L  4.0  3.4   Chloride 98 - 111 mmol/L  103  99   CO2 22 - 32 mmol/L  26  25   Calcium 8.9 - 10.3 mg/dL  8.4  8.2     8.  ID/intra-abdominal  infection.  Continue micafungin 100 mg daily as well as Zosyn 3.375 g every 8 hours.  Plan is for 6 weeks starting date 5/16 ending 05/11/2022 and will need repeat CT abdomen/pelvis on left thigh in 1 to 2 weeks -5/27 Pt has event last night on 5/26 of temp 101.6, HR 115, new onset cough, with shortness of breath.  Per night nurse on 5/26-5/27 became diaphoretic around 0000 on 5/27 and drenched the bed with sweat, with no subsequent fevers or episodes of tachycardia. Orders were placed for CBC, BMET, blood cultures x2, CXR, COVID, respiratory panel, and repeat CT abd/pelvis. ID was consulted the evening of 5/26 with recommendation given to continue current regimen of 6 week Zosyn and micafungin based on original plan.  Per [place], CT abdomen/pelvis indicated with febrile event, especially in light that patient's last CT scan was 04/05/22 and he inadvertently lost one of his JP drains on the morning of 04/06/22. -Respiratory panel and COVID negative.  CXR shows Small left pleural effusion with atelectasis at the left lung base. -CT abd/pelvis: demonstrates a complex left retroperitoneal fluid collection, with superior and inferior components that appear to communicate overlying the Gerota's fascia and left psoas musculature. ID to round on pt on 04/11/22 and will decide next steps. 04/11/22 See ID and Radiology  note.  IR was consulted for intra-abdominal fluid collection. Dr. Denna Haggard (IR) has approved patient for aspiration of inferior portion of fluid collection. -Per Radiology note, PA will reach out to General Surgery (Dr. Marcello Moores) to notify of plan. Also, called Dr. Rosendo Gros, MD on-call for General Surgery this weekend to update office on patient since surgery signed off on 04/09/22.  I was unable to reach provider since he was in-procedure.  But relayed message by telephone to nurse, Karena Addison, [place] of patient's febrile episode 04/09/22-04/10/22 and plans for abdominal fluid removal and possible drain placement by IR. 5/29 IR completed aspiration of 5m of purulent material, no drain placed 5/29 ID continuing Zosyn, micafungin 5/31 culture of L flank abscess with GNR and gram pos cocci 6/1 ID changing zosyn to Ceftriaxone and Augmentin, continuing Micafungin, continue to follow 6/2 no fevers overnight, follow 6/5 new fluid collections, ID changed to imipenem, general surgery consult 9.  Postoperative anemia.  Continue iron supplement.  Follow-up CBC 5/28 Hgb 8.2 low but stable, continue to trend and monitor. 5/29 Hgb stable at 8.0, follow 6/6 HGB a little lower today at 7.5, continue to monitor 6/7 HBG 7.6 this AM, stable overall, continue to monitor 6/9 surgery resutured his drains today    Latest Ref Rng & Units 04/21/2022    4:58 AM 04/20/2022    4:36 AM 04/18/2022    9:25 AM  CBC  WBC 4.0 - 10.5 K/uL 10.5  10.8  15.0   Hemoglobin 13.0 - 17.0 g/dL 7.6  7.5  8.2   Hematocrit 39.0 - 52.0 % 24.1  23.5  25.2   Platelets 150 - 400 K/uL 336  327  347    10.  Hypertension.  Continue Zebeta 10 mg nightly,   Monitor with increased mobility 6/9 BP well controlled, follow    04/22/2022    5:47 AM 04/21/2022    7:45 PM 04/21/2022    1:15 PM  Vitals with BMI  Systolic [id number] Diastolic 85 81 77  Pulse 75 87 88   11.  Decreased nutritional storage.  Dietary follow-up. Continue Prosource.  12.  Urinary retention.   Foley tube removed and urinating.  Check PVR 5/27 Pt is voiding okay, reported that he had some urinary incontinence for the first day or two immediately following Foley removal, which has since resolved. 5/31 reports no difficulty voiding, follow  13. Hypokalemia             -5/29KCL today, repeat labs             -5/30 K+ wnl now             6/3 K+, remains wnl's, 4.0, on 5/30 14. Hypoalbuminemia             -Continue ensure supplementation             -Discussed healthy diet, He ate all his breakfast today 5/30 15. Wound at lower buttocks site of previous drain             -5/20 WOC nurse consult recommends aquacel Ag with dry gauze and silicone foam. Continue to follow  -6/6 WOC noted increased R glueal wound drainage yesterday 6/3 Patient reports increased volume of greenish discharge from left buttock wound saturating dressing.  Wound bed appears pinkish red length 3 cm, width 1.5 cm, depth not measured per patient request but appears about 1.5-2 cm.  WBC's normal, afebrile.  Called ID to make awake, spoke with on-call MD Laurice Record, MD.  Michela Pitcher that ID will round and follow-up on 04/18/22. 6/4 Wound site still is producing greenish drainage, ID was notified yesterday and again today with WBC spike of 15.0. ID to round on and follow up on patient. -6/5 CT abdomen/pelv and left femur completed.  May need additional aspiration/drain. ID has seen him and recommended surgical eval. I called surgery christopher white for eval, appreciate assistance -6/6 Surgery ordered IR evaluation, appreciate assistance 6/7 CT Guided aspiration completed yesterday with 28m fluid drained, continue to monitor 16. Asthma,              -Using albuterol inhaler, monitor, improved today             -6/1 Reports breathing continues to be improved              6/8 Breathing continues to be improved, not requiring recent breathing treatments, continue to monitor 17. Leukocytosis  -6/5 ID adjusted abx to primaxin,  continue to follow  -6/6 WBC down to 10.8 today  -6/7 WBC 10.5 today, continue to follow  LOS: 13 days A FACE TO FKeota6/06/2022, 8:03 AM

## 2022-04-22 NOTE — Progress Notes (Signed)
Occupational Therapy Note  Patient Details  Name: RANDEN KAUTH MRN: [id number] Date of Birth: 1951-08-12  Today's Date: 04/22/2022 OT Missed Time: 60 Minutes Missed Time Reason: Pain  Pt missed 60 mins of group session d/t pain, will f/u as time allows to make up for missed minutes.    Corinne Ports [facility] 04/22/2022, 4:04 PM

## 2022-04-22 NOTE — Progress Notes (Signed)
Recreational Therapy Session Note  Patient Details  Name: Tyler [name] MRN: [id number] Date of Birth: 06-27-1951 Today's Date: 04/22/2022   Pt missed scheduled session due to c/o of pain.  RN/team aware.  Eval deferred.  [place] 04/22/2022, 4:08 PM

## 2022-04-22 NOTE — Progress Notes (Addendum)
Physical Therapy Note  Patient Details  Name: Tyler [name] MRN: [id number] Date of Birth: [birth year] Today's Date: 04/22/2022    Pt presents in bed reporting being in "excruciating" pain from where drains were dislodged and then re-sutured this morning without medication. Medication he had recently has not lessened his pain level and reporting hurting to just breathe or reposition in bed. Offered to come back in an hour and see if he might be able to participate then. Will follow up as able. Notified RN about pt requesting more pain medication.   At this time patient missed 60 min session of PT.  Attempted to make up time at 2pm but pt still in pain and unable to participate. Pt with difficulty repositioning. Emotional support and encouragement provided. All needs in reach.  Canary Brim Ivory Broad, PT, DPT, CBIS  04/22/2022, 1:18 PM

## 2022-04-22 NOTE — Progress Notes (Incomplete)
Inpatient Rehabilitation Discharge Medication Review by a Pharmacist  A complete drug regimen review was completed for this patient to identify any potential clinically significant medication issues.  High Risk Drug Classes Is patient taking? Indication by Medication  Antipsychotic {Receiving?:26196}   Anticoagulant {Receiving?:26196}   Antibiotic {Receiving?:26196}   Opioid {Receiving?:26196}   Antiplatelet {Receiving?:26196}   Hypoglycemics/insulin {Yes or No?:26198}   Vasoactive Medication {Receiving?:26196}   Chemotherapy {Receiving Chemo?:26197}   Other {Yes or No?:26198}      Type of Medication Issue Identified Description of Issue Recommendation(s)  Drug Interaction(s) (clinically significant)     Duplicate Therapy     Allergy     No Medication Administration End Date     Incorrect Dose     Additional Drug Therapy Needed     Significant med changes from prior encounter (inform family/care partners about these prior to discharge).    Other       Clinically significant medication issues were identified that warrant physician communication and completion of prescribed/recommended actions by midnight of the next day:  {Yes or No?:26198}  Name of provider notified for urgent issues identified: ***   Provider Method of Notification: ***    Pharmacist comments: ***   Time spent performing this drug regimen review (minutes): 20   Thank you for allowing pharmacy to be a part of this patient's care.  Ardyth Harps, PharmD Clinical Pharmacist

## 2022-04-22 NOTE — Progress Notes (Signed)
[name] for [facility]  Date of Admission:  04/09/2022              ASSESSMENT:  [name] is improved with recent placement of new drains with drainage appearing to be improved. Cultures from 04/20/22 with Enterococcus faecalis and E. Coli with sensitivities pending. Appears source control is greatly improved.  Continue with current dose of micafungin and imipenem. Duration  PLAN:  For now we are following most recent culture results to see if need for further revision of abtx regimen. For now continue on micafungin and imipenem Abdominal wall discomfort -localized pain from where sutures were placed to secure drains. Anticipate that will improve by tomorrow Deconditioning = continue to mobilize with pt/ot  Principal Problem:   Abscess, intra-abdominal, postoperative Active Problems:   Debility   Sepsis due to Enterococcus ([facility] [name])   E coli infection   Candida albicans infection   Leukocytosis   Pain in joint involving pelvic region and thigh    vitamin C  500 mg Oral BID   bisoprolol  10 mg Oral QHS   Chlorhexidine Gluconate Cloth  6 each Topical Daily   enoxaparin (LOVENOX) injection  40 mg Subcutaneous Q24H   famotidine  20 mg Oral BID   feeding supplement  237 mL Oral TID BM   ferrous sulfate  325 mg Oral BID WC   lip balm  1 application. Topical BID   loperamide  2 mg Oral TID with meals   methocarbamol  1,000 mg Oral TID   montelukast  10 mg Oral QHS   multivitamin with minerals  1 tablet Oral Daily   pantoprazole  40 mg Oral Daily   polycarbophil  1,250 mg Oral BID   sodium chloride flush  10-40 mL Intracatheter Q12H   sodium chloride flush  5 mL Intracatheter Q8H    SUBJECTIVE:  Afebrile overnight with no acute events. Feeling better.   Allergies  Allergen Reactions   Fluconazole Hives, Rash and Other (See Comments)    Shingles activated    Griseofulvin Anaphylaxis, Swelling, Rash and Other (See Comments)    Throat swelling      Sulfa Antibiotics Other (See Comments)    Joints ache, swell and caused inflammation    Oxycodone Nausea Only and Other (See Comments)    Nauseous to the point of almost vomiting   Mometasone Furo-Formoterol Fum Other (See Comments)    Lack of therapeutic effect    Retapamulin Rash     Review of Systems: ROS    OBJECTIVE: Vitals:   04/21/22 0451 04/21/22 1315 04/21/22 1945 04/22/22 0547  BP: 131/86 120/77 122/81 136/85  Pulse: 77 88 87 75  Resp: '16 16 19 20  '$ Temp: 98 F (36.7 C) 98.3 F (36.8 C) 98.1 F (36.7 C) 97.9 F (36.6 C)  TempSrc: Oral Oral Oral   SpO2: 99% 98% 99% 97%  Weight:      Height:       Body mass index is 24.45 kg/m.  Physical Exam  Lab Results Lab Results  Component Value Date   WBC 10.5 04/21/2022   HGB 7.6 (L) 04/21/2022   HCT 24.1 (L) 04/21/2022   MCV 92.7 04/21/2022   PLT 336 04/21/2022    Lab Results  Component Value Date   CREATININE 0.93 04/16/2022   BUN 10 04/13/2022   NA 135 04/13/2022   K 4.0 04/13/2022   CL 103 04/13/2022   CO2 26 04/13/2022    Lab Results  Component Value Date   ALT 36 04/13/2022   AST 28 04/13/2022   ALKPHOS 287 (H) 04/13/2022   BILITOT 0.5 04/13/2022     Microbiology: Recent Results (from the past 240 hour(s))  Aerobic/Anaerobic Culture w Gram Stain (surgical/deep wound)     Status: None (Preliminary result)   Collection Time: 04/20/22  1:25 PM   Specimen: Abscess  Result Value Ref Range Status   Specimen Description ABSCESS  Final   Special Requests PELVIS  Final   Gram Stain   Final    RARE WBC PRESENT,BOTH PMN AND MONONUCLEAR RARE GRAM POSITIVE COCCI IN PAIRS RARE GRAM NEGATIVE RODS Performed at [facility], [place] [address] [place], [place] [address]    Culture   Final    MODERATE ESCHERICHIA COLI MODERATE ENTEROCOCCUS FAECALIS SUSCEPTIBILITIES TO FOLLOW NO ANAEROBES ISOLATED; CULTURE IN PROGRESS FOR 5 DAYS    Report Status PENDING  Incomplete     Terri Piedra,  NP [facility]  04/22/2022  11:19 AM

## 2022-04-22 NOTE — Progress Notes (Signed)
Occupational Therapy Weekly Progress Note  Patient Details  Name: Tyler [name] MRN: [id number] Date of Birth: Apr 22, 1951  Beginning of progress report period: April 16, 2022 End of progress report period: April 22, 2022  Today's Date: 04/22/2022 OT Individual Time: 6629-4765 OT Individual Time Calculation (min): 75 min    Last week's STGs = LTGs as pt's d/c date planned for tomorrow. In team conference, team decided to extend his stay until April 28, 2022 as he has missed several days of therapy due to procedures and he needs more time to reach LTGs.   Patient continues to demonstrate the following deficits: muscle weakness and muscle joint tightness, decreased cardiorespiratoy endurance, and decreased standing balance and therefore will continue to benefit from skilled OT intervention to enhance overall performance with BADL and iADL.  Patient progressing toward long term goals..  Continue plan of care.  OT Short Term Goals Week 1:  OT Short Term Goal 1 (Week 1): Pt will complete grooming sink side level standing up to 4 minute intervals with brief intermittent rests seated as needed with close S OT Short Term Goal 1 - Progress (Week 1): Met OT Short Term Goal 2 (Week 1): Pt will complete toilet and shower bench transfers with CGA using grab bars OT Short Term Goal 2 - Progress (Week 1): Met (have just worked on toilet transfers) OT Short Term Goal 3 (Week 1): Pt will complete UB bathing and dressing with close S after set up OT Short Term Goal 3 - Progress (Week 1): Met OT Short Term Goal 4 (Week 1): Pt will complete LB bathing and dressing using AE as needed with min A OT Short Term Goal 4 - Progress (Week 1): Met OT Short Term Goal 5 (Week 1): Pt will verbally identify, express and employ 3/3 energy conservation strategies during ADL's, functional mobility and light UE ther ex//act with min VC's. OT Short Term Goal 5 - Progress (Week 1): Met Week 2:  OT Short Term Goal 1 (Week 2):  STGs = LTGs OT Short Term Goal 1 - Progress (Week 2): Progressing toward goal Week 3:  OT Short Term Goal 1 (Week 3): STGs = LTGS  Skilled Therapeutic Interventions/Progress Updates:    Pt received in bed ready to begin therapy. To warm up his LEs for movement, pt worked on self ROM of hips and knees with self stretching.  Low back stretching with torso twists. He can now cross his L hip more to don his L sock. Pt sat to EOB with S.  Adjusted his new RW to appropriate height.  Pt ambulated sink with close S to brush teeth in standing. Stood for over 2 minutes. No dizziness. Sat in w/c to shave and wash UB.  Did not do LB dressing as pt has wound care coming for his next session. In wc worked on a circuit routine focusing on faster speeds for increased exercise/ movement tolerance: -w/c push ups 10x - arm circles in and out 10x - alternating knee ext kicks 10x  - calf raises 10x  Repeated circuit 3 sets.   Reviewed LE stretches using gait belt and encouraged pt to work on those. Pt opted to stay up in wc. In room with all needs met.   Therapy Documentation Precautions:  Precautions Precautions: Fall Precaution Comments: drains, colostomy, rectal drainage Restrictions Weight Bearing Restrictions: No    Vital Signs: Therapy Vitals Temp: 97.9 F (36.6 C) Pulse Rate: 75 Resp: 20 BP: 136/85 Patient Position (if appropriate):  Lying Oxygen Therapy SpO2: 97 % O2 Device: Room Air Pain:  No c/o pain  ADL: ADL Eating: Independent Where Assessed-Eating: Wheelchair Grooming: Independent Where Assessed-Grooming: Wheelchair Upper Body Bathing: Independent Where Assessed-Upper Body Bathing: Wheelchair Lower Body Bathing: Setup Where Assessed-Lower Body Bathing: Wheelchair Upper Body Dressing: Independent Where Assessed-Upper Body Dressing: Wheelchair Lower Body Dressing: Supervision/safety Where Assessed-Lower Body Dressing: Wheelchair Toileting: Supervision/safety Where  Assessed-Toileting: Toilet (with empty ostomy bag at toilet) Toilet Transfer: Close supervision Toilet Transfer Method: Counselling psychologist: Energy manager: Not assessed Social research officer, government:  (unable to shower at this time due to PICC line) ADL Comments: pt can now don clothing and shoes without AE   Therapy/Group: Individual Therapy  [place] 04/22/2022, 7:48 AM

## 2022-04-22 NOTE — Progress Notes (Incomplete)
PHARMACY CONSULT NOTE FOR:  OUTPATIENT  PARENTERAL ANTIBIOTIC THERAPY (OPAT)  Indication:  Regimen:  End date:   IV antibiotic discharge orders are pended. To discharging provider:  please sign these orders via discharge navigator,  Select New Orders & click on the button choice - Manage This Unsigned Work.     Thank you for allowing pharmacy to be a part of this patient's care.  Lawson Radar 04/22/2022, 3:43 PM

## 2022-04-22 NOTE — Progress Notes (Signed)
ex lap, ostomy creation and drain placement Subjective: Known to our service - followed by Dr. Marcello Moores whom is currently out this week  Underwent perc drainage via gluteal approach with IR - feels MUCH better since this procedure. Minimal drainage from buttock wound now. Back pain/pernieal pain has now resolved. He can sit comfortably now.    No n/v. Colostomy working well. No abdominal pain.   Objective: Vital signs in last 24 hours: Temp:  [97.9 F (36.6 C)-98.3 F (36.8 C)] 97.9 F (36.6 C) (06/08 0547) Pulse Rate:  [75-88] 75 (06/08 0547) Resp:  [16-20] 20 (06/08 0547) BP: (120-136)/(77-85) 136/85 (06/08 0547) SpO2:  [97 %-99 %] 97 % (06/08 0547)   Intake/Output from previous day: 06/07 0701 - 06/08 0700 In: 720 [P.O.:720] Out: 1275 [Urine:950; Drains:125; Stool:200] Intake/Output this shift: Total I/O In: 236 [P.O.:236] Out: -    General appearance: alert and cooperative GI: soft, non-distended, colostomy with appropriate output All drains with purulent fluid, milky/tan except his subcutaneous drain which is more serous in nature.  2-0 silk suture used to suture both surgical drains back in place today.  Patient tolerated the procedure well.   Lab Results:  Recent Labs    04/20/22 0436 04/21/22 0458  WBC 10.8* 10.5  HGB 7.5* 7.6*  HCT 23.5* 24.1*  PLT 327 336   BMET No results for input(s): "NA", "K", "CL", "CO2", "GLUCOSE", "BUN", "CREATININE", "CALCIUM" in the last 72 hours.  PT/INR Recent Labs    04/20/22 1521  LABPROT 15.2  INR 1.2    ABG No results for input(s): "PHART", "HCO3" in the last 72 hours.  Invalid input(s): "PCO2", "PO2"   MEDS, Scheduled  vitamin C  500 mg Oral BID   bisoprolol  10 mg Oral QHS   Chlorhexidine Gluconate Cloth  6 each Topical Daily   enoxaparin (LOVENOX) injection  40 mg Subcutaneous Q24H   famotidine  20 mg Oral BID   feeding supplement  237 mL Oral TID BM   ferrous sulfate  325 mg Oral BID WC   lip balm  1  application. Topical BID   loperamide  2 mg Oral TID with meals   methocarbamol  1,000 mg Oral TID   montelukast  10 mg Oral QHS   multivitamin with minerals  1 tablet Oral Daily   pantoprazole  40 mg Oral Daily   polycarbophil  1,250 mg Oral BID   sodium chloride flush  10-40 mL Intracatheter Q12H   sodium chloride flush  5 mL Intracatheter Q8H    Studies/Results: CT IMAGE GUIDED DRAINAGE BY PERCUTANEOUS CATHETER  Result Date: 04/20/2022 INDICATION: 71 year old male with history of diverting colostomy with persistent and enlarging presacral fluid collection concerning for abscess, currently with inadequate drainage via indwelling surgical drain. EXAM: CT IMAGE GUIDED DRAINAGE BY PERCUTANEOUS CATHETER COMPARISON:  None Available. MEDICATIONS: The patient is currently admitted to the hospital and receiving intravenous antibiotics. The antibiotics were administered within an appropriate time frame prior to the initiation of the procedure. ANESTHESIA/SEDATION: Moderate (conscious) sedation was employed during this procedure. A total of Versed 1.5 mg and Fentanyl 75 mcg was administered intravenously. Moderate Sedation Time: 15 minutes. The patient's level of consciousness and vital signs were monitored continuously by radiology nursing throughout the procedure under my direct supervision. CONTRAST:  None COMPLICATIONS: None immediate. PROCEDURE: RADIATION DOSE REDUCTION: This exam was performed according to the departmental dose-optimization program which includes automated exposure control, adjustment of the mA and/or kV according to patient size and/or use  of iterative reconstruction technique. Informed written consent was obtained from the patient after a discussion of the risks, benefits and alternatives to treatment. The patient was placed in partial left lateral decubitus position on the CT gantry and a pre procedural CT was performed re-demonstrating the known abscess/fluid collection within the  presacral region. The procedure was planned. A timeout was performed prior to the initiation of the procedure. The right gluteal region was prepped and draped in the usual sterile fashion. The overlying soft tissues were anesthetized with 1% lidocaine with epinephrine. Appropriate trajectory was planned with the use of a 22 gauge spinal needle. An 18 gauge trocar needle was advanced into the abscess/fluid collection and a short Amplatz super stiff wire was coiled within the collection. Appropriate positioning was confirmed with a limited CT scan. The tract was serially dilated allowing placement of a 12 [place] all-purpose drainage catheter. Appropriate positioning was confirmed with a limited postprocedural CT scan. Approximately 30 ml of purulent fluid was aspirated. The tube was connected to a bulb suction and sutured in place. A dressing was placed. The patient tolerated the procedure well without immediate post procedural complication. IMPRESSION: Successful CT guided placement of a 48 French all purpose drain catheter into the presacral abscess via right transgluteal approach with aspiration of approximately 30 mL of purulent fluid. Samples were sent to the laboratory as requested by the ordering clinical team. Ruthann Cancer, MD [facility] Electronically Signed   By: Ruthann Cancer M.D.   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 04/03/2022   Protein-calorie malnutrition, severe ([place]) 04/03/2022   Anxiety associated with depression 04/03/2022   Intra-abdominal  abscess ([place]) 04/03/2022   Thigh abscess 04/03/2022   Normal anion gap metabolic acidosis 50/35/4656   Acute blood loss anemia (ABLA) 03/18/2022   Acute urinary retention 03/12/2022   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   Mild persistent asthma 03/12/2022   Abdominal pain 03/09/2022   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/09/2022   Pruritus ani 04/20/2018   Arthritis of both knees 01/13/2018   Hx of adenomatous polyp of colon 04/07/2017   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   Perianal dermatitis 01/14/2016   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/12/2013   History of tobacco use 11/12/2013   Low testosterone 09/19/2013   Sinusitis 12/21/2012   Herpes labialis 10/30/2012   Eczema 10/30/2012   Prolapsed internal hemorrhoids, grade 3, THD ligation 03/07/2022 10/30/2012   Hypertension 04/13/2011   FASCIITIS, PLANTAR 05/14/2010   NEOPLASM, SKIN, UNCERTAIN BEHAVIOR 81/27/5170   Chronic rhinitis 08/16/2008   Rash and other nonspecific skin eruption 07/02/2008   ABNORMAL COAGULATION PROFILE 05/29/2008   Gastroesophageal reflux disease 06/28/2007   PITYRIASIS ROSEA 06/28/2007   Osteoarthritis of knee 06/28/2007   Asthmatic bronchitis 05/11/2007    Pelvic sepsis.  Currently drained and debrided.    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  OR findings 5/6: significant RP infection, drained via L pericolic gutter, presacral infection, drained, anterior abd wall infection, drained.  CT 5/14: L RP fluid collection slightly small, L thigh fluid collection stable  IR aspiration: 5/16, cultures pending  CT 5/22: improving fluid collection, new left sided small pelvic collection, thigh fluid unchanged but doesn't appear rim enhancing to me  Plan:  Diet as tolerated,  protein shakes  ID: Cont IV antibiotics per ID - appreciate excellent assistance in his care.  Pain: Cont scheduled tylenol, tramadol prn, schedule robaxin, dilaudid prn   Anxiety: ativan   Cont SCD's, Lovenox SQ  Dressing changes q shift  IR drain 6/6 - presacral drain via transgluteal approach. Much improved now.  No further acute surgical needs identified, we will follow peripherally with you and will remain available for questions. Will need instruction on drain care with nursing.  Will also need to see Dr. Marcello Moores in office next week and we will work to arrange this.    LOS: 13 days   Henreitta Cea, [facility]  04/22/2022 9:51 AM

## 2022-04-23 DIAGNOSIS — E876 Hypokalemia: Secondary | ICD-10-CM

## 2022-04-23 DIAGNOSIS — R3915 Urgency of urination: Secondary | ICD-10-CM

## 2022-04-23 DIAGNOSIS — D72829 Elevated white blood cell count, unspecified: Secondary | ICD-10-CM

## 2022-04-23 LAB — URINALYSIS, ROUTINE W REFLEX MICROSCOPIC
Bilirubin Urine: NEGATIVE
Glucose, UA: NEGATIVE mg/dL
Hgb urine dipstick: NEGATIVE
Ketones, ur: NEGATIVE mg/dL
Leukocytes,Ua: NEGATIVE
Nitrite: NEGATIVE
Protein, ur: NEGATIVE mg/dL
Specific Gravity, Urine: 1.011 (ref 1.005–1.030)
pH: 6 (ref 5.0–8.0)

## 2022-04-23 LAB — CREATININE, SERUM
Creatinine, Ser: 0.76 mg/dL (ref 0.61–1.24)
GFR, Estimated: >60 mL/min (ref >60)

## 2022-04-23 NOTE — Progress Notes (Signed)
Physical Therapy Session Note  Patient Details  Name: Tyler [name] MRN: [id number] Date of Birth: 16-Dec-1950  Today's Date: 04/23/2022 PT Individual Time: 1470-9295 PT Individual Time Calculation (min): 31 min   Short Term Goals: Week 2:  PT Short Term Goal 1 (Week 2): = LTGS due to LOS PT Short Term Goal 2 (Week 2): =LTGs due to LOS PT Short Term Goal 3 (Week 2): =LTGs due to LOS PT Short Term Goal 4 (Week 2): =LTGs due to LOS  Skilled Therapeutic Interventions/Progress Updates:    Pt in bed upon arrival and agreeable to PT session. Bed Mobility: supine<>sitting EOB supervision with HOB elevated approx 40 degrees. Pt able to don shorts in standing with assist to lace LEs through. Donning shoes with min assist. Transfers sit<>stand with supervision and rw. Amb: 300 ft with rw, SBA and w/c in tow. TE: bilateral calf, hamstring. Lateral glute stretches in bed - 5 reps X30 sec. Each. Pt in bed with alarm on and all needs in reach. With nursing following session.   Therapy Documentation Precautions:  Precautions Precautions: Fall Precaution Comments: drains, colostomy, rectal drainage Restrictions Weight Bearing Restrictions: No Pain:  Pain is reported as moderate, no intervention requested at this time    Therapy/Group: Individual Therapy  Linard Millers 04/23/2022, 3:50 PM

## 2022-04-23 NOTE — Progress Notes (Signed)
Nutrition Follow-up  DOCUMENTATION CODES:   Not applicable  INTERVENTION:   Continue Multivitamin w/ minerals daily Continue Ensure Enlive po TID, each supplement provides 350 kcal and 20 grams of protein.  NUTRITION DIAGNOSIS:   Inadequate oral intake related to altered GI function, acute illness, decreased appetite as evidenced by meal completion < 50%, per patient/family report. 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 PMH includes HTN, GERD, former tobacco use, hernia repair  6/6 - R TG drain placed   Pt resting in bed, trying to take a nap before next therapy session.   Pt reports that his PO intake has been ok, states that he has not been finishing his trays. Denies any nausea or vomiting at this time. Reports that he has not had any output in his colostomy today and that it is "stocked up."  Reports that he is drinking his 3 Ensures per day. States that he has not had one yet this morning, but would like one before next therapy session at 11 am.   Per EMR, pt PO intake includes: 6/06: Lunch 100% 6/07: Breakfast 80%, Lunch 90%, Dinner 100% 6/08: Breakfast 100%, Lunch 75%, Dubber 100% 6/09: Breakfast 100%  Pt with no other questions or concerns at this time.   Medications reviewed and include: Vitamin C, Pepcid, Ferrous Sulfate, Imodium, MVI, Protonix, Fibercon, IV antibiotics, IV antifungal, IV Zofran (PRN) Labs reviewed.  Diet Order:   Diet Order             Diet regular Room service appropriate? Yes; Fluid consistency: Thin  Diet effective now                    EDUCATION NEEDS:   No education needs have been identified at this time  Skin:  Skin Integrity Issues:: DTI, Incisions, Other (Comment) DTI: bilateral heels Incisions: rectum and abdomen Other: new colostomy  Last BM:  6/8 via colostomy  Height:  Ht Readings from Last 1 Encounters:  04/09/22 '5\' 9"'$  (1.753 m)   Weight:  Wt Readings from Last 1 Encounters:  04/09/22 75.1 kg   Ideal Body Weight:  72.7 kg  BMI:  Body mass index is 24.45 kg/m.  Estimated Nutritional Needs:  Kcal:  0349-1791 kcls Protein:  115-135 g Fluid:  >/= 2L    Hermina Barters RD, LDN Clinical Dietitian See [facility] for contact information.

## 2022-04-23 NOTE — Progress Notes (Addendum)
R transgluteal drain placed 04/20/22 seen in follow-up today  Drain Location: Right transgluteal drain Size: Fr size: 12 Fr Date of placement: 04/20/22  Currently to: Drain collection device: suction bulb 24 hour output:  Output by Drain (mL) 04/21/22 0701 - 04/21/22 1900 04/21/22 1901 - 04/22/22 0700 04/22/22 0701 - 04/22/22 1900 04/22/22 1901 - 04/23/22 0700 04/23/22 0701 - 04/23/22 1342  Closed System Drain 1 Lateral;Right RLQ Bulb (JP) 19 Fr. '15 5  28   '$ Closed System Drain 2 Lateral;Left LLQ Bulb (JP) 19 Fr. '30 25 30 '$ 40   Closed System Drain 3 Inferior;Right Back Bulb (JP) 12 Fr. 30 20  70    Approximately 30 mL of purulent fluid in R transgluteal drain's suction bulb on exam  Interval imaging/drain manipulation:  None  Current examination: Flushes/aspirates easily.  Insertion site unremarkable. Suture and stat lock in place. Dressed appropriately.  Incision site clean, dry, and intact. No signs of infection Reports nursing is flushing drain.  Plan: Continue TID flushes with 5 cc NS. Record output Q shift. Dressing changes QD or PRN if soiled.  Call IR APP or on call IR MD if difficulty flushing or sudden change in drain output.  Repeat imaging/possible drain injection once output < 10 mL/QD (excluding flush material.)  Discharge planning: Please contact IR APP or on call IR MD prior to patient d/c to ensure appropriate follow up plans are in place. Typically patient will follow up with IR clinic 10-14 days post d/c for repeat imaging/possible drain injection. IR scheduler will contact patient with date/time of appointment. Patient will need to flush drain QD with 5 cc NS, record output QD, dressing changes every 2-3 days or earlier if soiled. Order for drain clinic placed  IR will continue to follow - please call with questions or concerns.

## 2022-04-23 NOTE — Progress Notes (Signed)
Occupational Therapy Session Note  Patient Details  Name: Tyler [name] MRN: [id number] Date of Birth: 01-May-1951  Today's Date: 04/23/2022 OT Individual Time: 4268-3419 OT Individual Time Calculation (min): 59 min    Short Term Goals: Week 3:  OT Short Term Goal 1 (Week 3): STGs = LTGS  Skilled Therapeutic Interventions/Progress Updates:    Pt states 5/10 pain in buttocks; requesting pain medication   Pt sitting up in w/c, nurse notified of pts request and pain level.  Nurse arrived and ten minutes deducted from OT session for medication administration.  Pt self propelled to ortho gym~30 feet with mod I.  Stand pivot to nustep and completed x 3 minutes BUE/BLE propulsion on resistance level 3 then PICC team arrived to disconnect.  Pt requesting to donn clean shirt and did so with setup.  Pt completed an additional 8 minutes on Nustep.  Pulse assessed at end: 90bpm.  Pt then completed sit to stand at [facility] and ambulated ~100 feet in hallway then stand to sit with supervision. PRE during ambulation = 12/20.  Pt requesting to return to bed and self propelled to room with mod I. Stand pivot without AD with supervision to EOB.  Sit to supine with mod I.  Surgical PA arrived for assessment therefore OT session discontinued.  Missed 16 minutes of treatment. Call bell in reach, bed alarm on.     Therapy Documentation Precautions:  Precautions Precautions: Fall Precaution Comments: drains, colostomy, rectal drainage Restrictions Weight Bearing Restrictions: No    Therapy/Group: Individual Therapy  Ezekiel Slocumb 04/23/2022, 12:53 PM

## 2022-04-23 NOTE — Progress Notes (Signed)
PROGRESS NOTE   Subjective/Complaints: Pt in [place] this AM.  Surgery to see him this am as he had some discomfort where drains were stiched. This pain improved this AM.  He has had more frequent urination and urgency.    Review of Systems  Review of Systems  Constitutional:  Negative for chills and fever.  Respiratory:  Negative for shortness of breath.   Cardiovascular:  Negative for chest pain and palpitations.  Gastrointestinal:  Negative for abdominal pain, nausea and vomiting.  Genitourinary:  Positive for frequency and urgency.  Neurological:  Positive for weakness. Negative for headaches.     Objective:   No results found. Recent Labs    04/21/22 0458  WBC 10.5  HGB 7.6*  HCT 24.1*  PLT 336    Recent Labs    04/23/22 0503  CREATININE 0.76     Intake/Output Summary (Last 24 hours) at 04/23/2022 0846 Last data filed at 04/23/2022 0754 Gross per 24 hour  Intake 836 ml  Output 1993 ml  Net -1157 ml      Pressure Injury 04/09/22 Heel Left Deep Tissue Pressure Injury - Purple or maroon localized area of discolored intact skin or blood-filled blister due to damage of underlying soft tissue from pressure and/or shear. (Active)  04/09/22 1315  Location: Heel  Location Orientation: Left  Staging: Deep Tissue Pressure Injury - Purple or maroon localized area of discolored intact skin or blood-filled blister due to damage of underlying soft tissue from pressure and/or shear.  Wound Description (Comments):   Present on Admission: Yes     Pressure Injury 04/09/22 Heel Right Deep Tissue Pressure Injury - Purple or maroon localized area of discolored intact skin or blood-filled blister due to damage of underlying soft tissue from pressure and/or shear. (Active)  04/09/22 1315  Location: Heel  Location Orientation: Right  Staging: Deep Tissue Pressure Injury - Purple or maroon localized area of discolored intact skin or  blood-filled blister due to damage of underlying soft tissue from pressure and/or shear.  Wound Description (Comments):   Present on Admission: Yes    Physical Exam: Vital Signs Blood pressure 133/86, pulse 75, temperature 98.4 F (36.9 C), temperature source Oral, resp. rate 19, height '5\' 9"'$  (1.753 m), weight 75.1 kg, SpO2 98 %.  General: Alert and oriented x3. NAD HEENT: Head, normocephalic, atraumatic, PERRLA, EOMI, sclera aniecteric, oral mucosa pink and moist Neck: supple Chest: normal rate, no increased WOB Abdomen: soft, positive bowel sounds, minimal tenderness,  Colostomy viable with stool present. Comments:  JP drains 2 milky looking drainage, sub q drain with milky drainage Skin: Stoma around colostomy intact.  Buttocks have left wound covered with dressing. Heels without open wounds or redness this AM Extremities: No clubbing, edema, or cyanosis.  Neuro: Alert and oriented x3, CN II-XII intact, no focal deficits. Psych: pleasant   Assessment/Plan: 1. Functional deficits which require 3+ hours per day of interdisciplinary therapy in a comprehensive inpatient rehab setting. Physiatrist is providing close team supervision and 24 hour management of active medical problems listed below. Physiatrist and rehab team continue to assess barriers to discharge/monitor patient progress toward functional and medical goals  Care Tool:  Bathing    Body parts bathed by patient: Right arm, Left arm, Chest, Abdomen, Face, Front perineal area, Right upper leg, Left upper leg, Right lower leg, Left lower leg, Buttocks   Body parts bathed by helper: Buttocks, Right lower leg, Left lower leg     Bathing assist Assist Level: Supervision/Verbal cueing     Upper Body Dressing/Undressing Upper body dressing   What is the patient wearing?: Pull over shirt    Upper body assist Assist Level: Independent    Lower Body Dressing/Undressing Lower body dressing      What is the patient  wearing?: Incontinence brief     Lower body assist Assist for lower body dressing: Supervision/Verbal cueing     Toileting Toileting    Toileting assist Assist for toileting: Minimal Assistance - Patient > 75% (urinal use- has colostomy with total A for care)     Transfers Chair/bed transfer  Transfers assist     Chair/bed transfer assist level: Supervision/Verbal cueing     Locomotion Ambulation   Ambulation assist      Assist level: Contact Guard/Touching assist Assistive device: Walker-rolling Max distance: 150'   Walk 10 feet activity   Assist     Assist level: Supervision/Verbal cueing Assistive device: Walker-rolling   Walk 50 feet activity   Assist Walk 50 feet with 2 turns activity did not occur: Safety/medical concerns  Assist level: Supervision/Verbal cueing Assistive device: Walker-rolling    Walk 150 feet activity   Assist Walk 150 feet activity did not occur: Safety/medical concerns  Assist level: Contact Guard/Touching assist Assistive device: Walker-rolling    Walk 10 feet on uneven surface  activity   Assist Walk 10 feet on uneven surfaces activity did not occur: Safety/medical concerns         Wheelchair     Assist Is the patient using a wheelchair?: Yes Type of Wheelchair: Manual    Wheelchair assist level: Supervision/Verbal cueing Max wheelchair distance: 150    Wheelchair 50 feet with 2 turns activity    Assist        Assist Level: Supervision/Verbal cueing   Wheelchair 150 feet activity     Assist      Assist Level: Supervision/Verbal cueing   Blood pressure 133/86, pulse 75, temperature 98.4 F (36.9 C), temperature source Oral, resp. rate 19, height '5\' 9"'$  (1.753 m), weight 75.1 kg, SpO2 98 %.   Medical Problem List and Plan: 1. Functional deficits secondary to debility secondary to pelvic abscess.  Status post laparoscopy, abdominal washout drain placement with creation of diverting  colostomy 03/20/2022             -patient may shower but incisions/drains should be covered             -ELOS/Goals: 10-14 days modI             -Continue PT/OT  -Expected discharge 6/14  -He missed some therapy this week due to IR procedure 2.  Antithrombotics: -DVT/anticoagulation:  Pharmaceutical: Lovenox.  Venous Doppler negative             -antiplatelet therapy: N/A 5/27 Continue Lovenox 3. Pain Management: Robaxin 1000 mg 3 times daily, tramadol as needed 5/31- Pain continues to be controlled, continue PRN tramadol 6/1 Discussed use of pain meds when needed to stay ahead of pain for therapy 6/5 Not using PRN pain medications, he reports pain is well controlled 6/8 He reports PRN tramadol helping to keep pain under control, helping with sleep and therapy  tolerance 4. Mood: Ativan as needed as well as trazodone             -antipsychotic agents: N/A 5. Neuropsych: This patient is capable of making decisions on his own behalf. 6. Skin/Wound Care: Routine skin checks with colostomy education 7. Fluids/Electrolytes/Nutrition: Routine in and outs with follow-up chemistries 5/27 Slightly low sodium (133) and potassium (3.4), continue to trend, consider repleting 5/30 Na up to 135 and K wnl after repletion 6/4 No issues with electrolytes today Recheck labs tomorrow    Latest Ref Rng & Units 04/23/2022    5:03 AM 04/16/2022    4:34 AM 04/13/2022    4:20 AM  BMP  Glucose 70 - 99 mg/dL   105   BUN 8 - 23 mg/dL   10   Creatinine 0.61 - 1.24 mg/dL 0.76  0.93  1.00   Sodium 135 - 145 mmol/L   135   Potassium 3.5 - 5.1 mmol/L   4.0   Chloride 98 - 111 mmol/L   103   CO2 22 - 32 mmol/L   26   Calcium 8.9 - 10.3 mg/dL   8.4     8.  ID/intra-abdominal infection.  Continue micafungin 100 mg daily as well as Zosyn 3.375 g every 8 hours.  Plan is for 6 weeks starting date 5/16 ending 05/11/2022 and will need repeat CT abdomen/pelvis on left thigh in 1 to 2 weeks -5/27 Pt has event last night on  5/26 of temp 101.6, HR 115, new onset cough, with shortness of breath.  Per night nurse on 5/26-5/27 became diaphoretic around 0000 on 5/27 and drenched the bed with sweat, with no subsequent fevers or episodes of tachycardia. Orders were placed for CBC, BMET, blood cultures x2, CXR, COVID, respiratory panel, and repeat CT abd/pelvis. ID was consulted the evening of 5/26 with recommendation given to continue current regimen of 6 week Zosyn and micafungin based on original plan.  Per [place], CT abdomen/pelvis indicated with febrile event, especially in light that patient's last CT scan was 04/05/22 and he inadvertently lost one of his JP drains on the morning of 04/06/22. -Respiratory panel and COVID negative.  CXR shows Small left pleural effusion with atelectasis at the left lung base. -CT abd/pelvis: demonstrates a complex left retroperitoneal fluid collection, with superior and inferior components that appear to communicate overlying the Gerota's fascia and left psoas musculature. ID to round on pt on 04/11/22 and will decide next steps. 04/11/22 See ID and Radiology note.  IR was consulted for intra-abdominal fluid collection. Dr. Denna Haggard (IR) has approved patient for aspiration of inferior portion of fluid collection. -Per Radiology note, PA will reach out to General Surgery (Dr. Marcello Moores) to notify of plan. Also, called Dr. Rosendo Gros, MD on-call for General Surgery this weekend to update office on patient since surgery signed off on 04/09/22.  I was unable to reach provider since he was in-procedure.  But relayed message by telephone to nurse, Karena Addison, [place] of patient's febrile episode 04/09/22-04/10/22 and plans for abdominal fluid removal and possible drain placement by IR. 5/29 IR completed aspiration of 63m of purulent material, no drain placed 5/29 ID continuing Zosyn, micafungin 5/31 culture of L flank abscess with GNR and gram pos cocci 6/1 ID changing zosyn to Ceftriaxone and Augmentin, continuing  Micafungin, continue to follow 6/2 no fevers overnight, follow 6/5 new fluid collections, ID changed to imipenem, general surgery consult 6/8 surgery resutured his drains today 9.  Postoperative anemia.  Continue  iron supplement.  Follow-up CBC 6/7 HBG 7.6 this AM, stable overall, continue to monitor Check labs tomorrow    Latest Ref Rng & Units 04/21/2022    4:58 AM 04/20/2022    4:36 AM 04/18/2022    9:25 AM  CBC  WBC 4.0 - 10.5 K/uL 10.5  10.8  15.0   Hemoglobin 13.0 - 17.0 g/dL 7.6  7.5  8.2   Hematocrit 39.0 - 52.0 % 24.1  23.5  25.2   Platelets 150 - 400 K/uL 336  327  347    10.  Hypertension.  Continue Zebeta 10 mg nightly,   Monitor with increased mobility  BP well controlled, continue current medications    04/23/2022    3:25 AM 04/22/2022    8:05 PM 04/22/2022    1:21 PM  Vitals with BMI  Systolic 161 096 045  Diastolic 86 78 81  Pulse 75 79 80   11.  Decreased nutritional storage.  Dietary follow-up. Continue Prosource.  12.  Urinary retention.  Foley tube removed and urinating.  Check PVR 5/27 Pt is voiding okay, reported that he had some urinary incontinence for the first day or two immediately following Foley removal, which has since resolved. -Frequency of urination increased today, will check U/A and culture, possibly it is due to increased fluid intake 5/31 reports no difficulty voiding, follow  13. Hypokalemia             -5/29KCL today, repeat labs             -5/30 K+ wnl now             6/3 K+, remains wnl's, 4.0, on 5/30  Recheck labs tomorrow 14. Hypoalbuminemia             -Continue ensure supplementation             -Discussed healthy diet, He ate all his breakfast today 5/30 15. Wound at lower buttocks site of previous drain             -5/20 WOC nurse consult recommends aquacel Ag with dry gauze and silicone foam. Continue to follow  -6/6 WOC noted increased R glueal wound drainage yesterday 6/3 Patient reports increased volume of greenish discharge from  left buttock wound saturating dressing.  Wound bed appears pinkish red length 3 cm, width 1.5 cm, depth not measured per patient request but appears about 1.5-2 cm.  WBC's normal, afebrile.  Called ID to make awake, spoke with on-call MD Laurice Record, MD.  Michela Pitcher that ID will round and follow-up on 04/18/22. 6/4 Wound site still is producing greenish drainage, ID was notified yesterday and again today with WBC spike of 15.0. ID to round on and follow up on patient. -6/5 CT abdomen/pelv and left femur completed.  May need additional aspiration/drain. ID has seen him and recommended surgical eval. I called surgery christopher white for eval, appreciate assistance -6/6 Surgery ordered IR evaluation, appreciate assistance 6/7 CT Guided aspiration completed yesterday with 37m fluid drained, continue to monitor 16. Asthma,              -Using albuterol inhaler, monitor, improved today             -6/1 Reports breathing continues to be improved              6/8 Breathing continues to be improved, not requiring recent breathing treatments, continue to monitor 17. Leukocytosis  -6/6 WBC down to 10.8 today  -6/7 WBC  10.5 today, continue to follow  -recheck labs tomorrow  LOS: 14 days A FACE TO FACE EVALUATION WAS PERFORMED  Jennye Boroughs 04/23/2022, 8:46 AM

## 2022-04-23 NOTE — Progress Notes (Signed)
ex lap, ostomy creation and drain placement Subjective: Concerned about pain around his stitch that I put in yesterday.  No other complaints  Objective: Vital signs in last 24 hours: Temp:  [98.4 F (36.9 C)-98.7 F (37.1 C)] 98.4 F (36.9 C) (06/09 0325) Pulse Rate:  [75-80] 75 (06/09 0325) Resp:  [16-19] 19 (06/09 0325) BP: (119-133)/(78-86) 133/86 (06/09 0325) SpO2:  [97 %-98 %] 98 % (06/09 0325)   Intake/Output from previous day: 06/08 0701 - 06/09 0700 In: 955 [P.O.:945; I.V.:10] Out: 1993 [Urine:1825; Drains:168] Intake/Output this shift: Total I/O In: 117 [P.O.:117] Out: -    General appearance: alert and cooperative GI: soft, non-distended, colostomy with appropriate output All drains with purulent fluid, milky/tan except his subcutaneous drain which is more serous in nature.  Mild irritation around each surgical drain, but no infection, bleeding, or issues   Lab Results:  Recent Labs    04/21/22 0458  WBC 10.5  HGB 7.6*  HCT 24.1*  PLT 336   BMET Recent Labs    04/23/22 0503  CREATININE 0.76    PT/INR Recent Labs    04/20/22 1521  LABPROT 15.2  INR 1.2    ABG No results for input(s): "PHART", "HCO3" in the last 72 hours.  Invalid input(s): "PCO2", "PO2"   MEDS, Scheduled  vitamin C  500 mg Oral BID   bisoprolol  10 mg Oral QHS   Chlorhexidine Gluconate Cloth  6 each Topical Daily   enoxaparin (LOVENOX) injection  40 mg Subcutaneous Q24H   famotidine  20 mg Oral BID   feeding supplement  237 mL Oral TID BM   ferrous sulfate  325 mg Oral BID WC   lip balm  1 application  Topical BID   loperamide  2 mg Oral TID with meals   methocarbamol  1,000 mg Oral TID   montelukast  10 mg Oral QHS   multivitamin with minerals  1 tablet Oral Daily   pantoprazole  40 mg Oral Daily   polycarbophil  1,250 mg Oral BID   sodium chloride flush  10-40 mL Intracatheter Q12H   sodium chloride flush  5 mL Intracatheter Q8H    Studies/Results: No  results found.  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 04/03/2022   Protein-calorie malnutrition, severe ([place]) 04/03/2022   Anxiety associated with depression 04/03/2022   Intra-abdominal abscess ([place]) 04/03/2022   Thigh abscess 04/03/2022   Normal anion gap metabolic acidosis 86/57/8469   Acute blood loss anemia (ABLA) 03/18/2022   Acute urinary retention 03/12/2022   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   Mild persistent asthma 03/12/2022   Abdominal pain 03/09/2022   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/09/2022   Pruritus ani 04/20/2018   Arthritis of both knees 01/13/2018   Hx of adenomatous polyp of colon 04/07/2017   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   Perianal dermatitis 01/14/2016   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/12/2013   History of tobacco use 11/12/2013   Low testosterone 09/19/2013   Sinusitis 12/21/2012   Herpes labialis 10/30/2012   Eczema  10/30/2012   Prolapsed internal hemorrhoids, grade 3, THD ligation 03/07/2022 10/30/2012   Hypertension 04/13/2011   FASCIITIS, PLANTAR 05/14/2010   NEOPLASM, SKIN, UNCERTAIN BEHAVIOR 65/01/5464   Chronic rhinitis 08/16/2008   Rash and other nonspecific skin eruption 07/02/2008   ABNORMAL COAGULATION PROFILE 05/29/2008   Gastroesophageal reflux disease 06/28/2007   PITYRIASIS ROSEA 06/28/2007   Osteoarthritis of knee 06/28/2007   Asthmatic bronchitis 05/11/2007    Pelvic sepsis.  Currently drained and debrided.    OR findings 5/1: Pt with necrosis of his anoderm  posteriorly on EUA, large R gluteal abscess- opened and drained  CT 5/5 shows significant retroperitoneal contamination.    OR findings 5/6: significant RP infection, drained via L pericolic gutter, presacral infection, drained, anterior abd wall infection, drained.  CT 5/14: L RP fluid collection slightly small, L thigh fluid collection stable  IR aspiration: 5/16, cultures pending  CT 5/22: improving fluid collection, new left sided small pelvic collection, thigh fluid unchanged but doesn't appear rim enhancing to me  Plan:  Diet as tolerated, protein shakes  -suspect pain secondary to irritation around drain sites.  No other issues noted.  IR drain 6/6 - presacral drain via transgluteal approach. Much improved now.  No further acute surgical needs identified, we will follow peripherally with you and will remain available for questions. Will need instruction on drain care with nursing.  Will also need to see Dr. Marcello Moores in office next week and we will work to arrange this.    LOS: 14 days   Henreitta Cea, [facility], [facility]  04/23/2022 10:19 AM

## 2022-04-23 NOTE — Progress Notes (Signed)
Occupational Therapy Note  Patient Details  Name: SAMEER TEEPLE MRN: [id number] Date of Birth: [birth year]  Today's Date: 04/23/2022 OT Missed Time: 40 Minutes Missed Time Reason: Unavailable (comment);Other (comment) (eating lunch)  Pt missed 30 mins of session as pt eating lunch, will check back as time allows to make up for missed minutes.    Corinne Ports [facility] 04/23/2022, 3:44 PM

## 2022-04-23 NOTE — Progress Notes (Signed)
Occupational Therapy Session Note  Patient Details  Name: Tyler [name] MRN: [id number] Date of Birth: [birth year]  Today's Date: 04/23/2022 OT Individual Time: 1104-1200 OT Individual Time Calculation (min): 56 min   Skilled Therapeutic Interventions/Progress Updates:   Pt greeted semi-reclined in bed, stating he felt better than yesterday, but still having pain. Pt completed bed mobility with increased time and supervision. Drains secured to pants using safety pins. Pt ambulated to therapy gym w/ RW and close supervision. Worked on standing balance/endurance and UB strength. Pt stood on foam mat with CGA. CGA for balance without UE support. Pt then completed 10 chest press and bicep curls while standing on foam, with rest break in between. Attempted third round standing on foam, but pt unable to get to standing and stated his legs were just too worn out. Pt then completed 5 mins x2 forward, then backwards on [employer] arm bike. Pt needed extended rest break in between sets. OT encouraged pt to ambulate, but he stated he was too tired. LB strength and endurance with wc propulsion down hallway with 1 rest break. Pt returned to room and performed stand-pivot back to bed with supervision. Pt left semi-reclined in bed with bed alarm on, call bell in reach, and needs met.   Therapy Documentation Precautions:  Precautions Precautions: Fall Precaution Comments: drains, colostomy, rectal drainage Restrictions Weight Bearing Restrictions: No Pain:  No pain number given. Reports pain is a little better. Rest and repositioned for comfort.   Therapy/Group: Individual Therapy  Valma Cava 04/23/2022, 11:39 AM

## 2022-04-24 LAB — CBC
HCT: 23.6 % — ABNORMAL LOW (ref 39.0–52.0)
Hemoglobin: 7.4 g/dL — ABNORMAL LOW (ref 13.0–17.0)
MCH: 28.8 pg (ref 26.0–34.0)
MCHC: 31.4 g/dL (ref 30.0–36.0)
MCV: 91.8 fL (ref 80.0–100.0)
Platelets: 378 10*3/uL (ref 150–400)
RBC: 2.57 MIL/uL — ABNORMAL LOW (ref 4.22–5.81)
RDW: 14.7 % (ref 11.5–15.5)
WBC: 11.4 10*3/uL — ABNORMAL HIGH (ref 4.0–10.5)
nRBC: 0 % (ref 0.0–0.2)

## 2022-04-24 LAB — BASIC METABOLIC PANEL
Anion gap: 11 (ref 5–15)
BUN: 17 mg/dL (ref 8–23)
CO2: 26 mmol/L (ref 22–32)
Calcium: 9.1 mg/dL (ref 8.9–10.3)
Chloride: 99 mmol/L (ref 98–111)
Creatinine, Ser: 0.91 mg/dL (ref 0.61–1.24)
GFR, Estimated: >60 mL/min (ref >60)
Glucose, Bld: 96 mg/dL (ref 70–99)
Potassium: 4.1 mmol/L (ref 3.5–5.1)
Sodium: 136 mmol/L (ref 135–145)

## 2022-04-24 MED ORDER — CHLORHEXIDINE GLUCONATE CLOTH 2 % EX PADS
6.0000 | MEDICATED_PAD | Freq: Two times a day (BID) | CUTANEOUS | Status: DC
  Administered 2022-04-25 – 2022-04-28 (×7): 6 via TOPICAL

## 2022-04-24 NOTE — Progress Notes (Signed)
PROGRESS NOTE   Subjective/Complaints: Patient seen while lying in bed this morning has concerns of a very slight elevation and WBCs overnight so that I would discuss with Dr. Tessa Lerner MD. in the past 24 hours he did have a low-grade fever of 99.0.  We will continue to monitor no other complaints overnight.  ROS: Denies chills shortness of breath chest pain palpitations, nausea vomiting constipation diarrhea. Positive for weakness had brief episode of low-grade fever in past 24 hours.   Objective:   No results found. Recent Labs    04/24/22 0441  WBC 11.4*  HGB 7.4*  HCT 23.6*  PLT 378   Recent Labs    04/23/22 0503 04/24/22 0441  NA  --  136  K  --  4.1  CL  --  99  CO2  --  26  GLUCOSE  --  96  BUN  --  17  CREATININE 0.76 0.91  CALCIUM  --  9.1    Intake/Output Summary (Last 24 hours) at 04/24/2022 1450 Last data filed at 04/24/2022 1006 Gross per 24 hour  Intake 132 ml  Output 2610 ml  Net -2478 ml     Pressure Injury 04/09/22 Heel Left Deep Tissue Pressure Injury - Purple or maroon localized area of discolored intact skin or blood-filled blister due to damage of underlying soft tissue from pressure and/or shear. (Active)  04/09/22 1315  Location: Heel  Location Orientation: Left  Staging: Deep Tissue Pressure Injury - Purple or maroon localized area of discolored intact skin or blood-filled blister due to damage of underlying soft tissue from pressure and/or shear.  Wound Description (Comments):   Present on Admission: Yes     Pressure Injury 04/09/22 Heel Right Deep Tissue Pressure Injury - Purple or maroon localized area of discolored intact skin or blood-filled blister due to damage of underlying soft tissue from pressure and/or shear. (Active)  04/09/22 1315  Location: Heel  Location Orientation: Right  Staging: Deep Tissue Pressure Injury - Purple or maroon localized area of discolored intact skin or  blood-filled blister due to damage of underlying soft tissue from pressure and/or shear.  Wound Description (Comments):   Present on Admission: Yes    Physical Exam: Vital Signs Blood pressure 133/85, pulse 82, temperature 98.6 F (37 C), temperature source Oral, resp. rate 16, height '5\' 9"'$  (1.753 m), weight 75.1 kg, SpO2 96 %.   General: Alert and oriented x 3, No apparent distress HEENT: Head is normocephalic, atraumatic, PERRLA, EOMI, sclera anicteric, oral mucosa pink and moist, dentition intact, ext ear canals clear,  Neck: Supple without JVD or lymphadenopathy Heart: Reg rate and rhythm. No murmurs rubs or gallops Chest: CTA bilaterally without wheezes, rales, or rhonchi; no distress Abdomen: Soft, nondistended, bowel sounds positive.  Minimal tenderness, colostomy viable with stool present. Comments: JP drains to milky looking drainage, subcu drain with milky drainage Skin: Stoma around colostomy intact.  Buttocks had left wound covered with dressing.  Heels without open wounds or redness this morning Extremities: No clubbing, cyanosis, or edema. Pulses are 2+ Psych: Pt's affect is appropriate. Pt is cooperative Neuro: Alert and oriented x3, CN II through XII intact no focal  deficits. Musculoskeletal: Able to move all 4 extremities without limitations normal strength normal sensation throughout.  Assessment/Plan: 1. Functional deficits which require 3+ hours per day of interdisciplinary therapy in a comprehensive inpatient rehab setting. Physiatrist is providing close team supervision and 24 hour management of active medical problems listed below. Physiatrist and rehab team continue to assess barriers to discharge/monitor patient progress toward functional and medical goals  Care Tool:  Bathing    Body parts bathed by patient: Right arm, Left arm, Chest, Abdomen, Face, Front perineal area, Right upper leg, Left upper leg, Right lower leg, Left lower leg, Buttocks   Body parts  bathed by helper: Buttocks, Right lower leg, Left lower leg     Bathing assist Assist Level: Supervision/Verbal cueing     Upper Body Dressing/Undressing Upper body dressing   What is the patient wearing?: Pull over shirt    Upper body assist Assist Level: Independent    Lower Body Dressing/Undressing Lower body dressing      What is the patient wearing?: Incontinence brief     Lower body assist Assist for lower body dressing: Supervision/Verbal cueing     Toileting Toileting    Toileting assist Assist for toileting: Minimal Assistance - Patient > 75% (urinal use- has colostomy with total A for care)     Transfers Chair/bed transfer  Transfers assist     Chair/bed transfer assist level: Supervision/Verbal cueing     Locomotion Ambulation   Ambulation assist      Assist level: Contact Guard/Touching assist Assistive device: Walker-rolling Max distance: 150'   Walk 10 feet activity   Assist     Assist level: Supervision/Verbal cueing Assistive device: Walker-rolling   Walk 50 feet activity   Assist Walk 50 feet with 2 turns activity did not occur: Safety/medical concerns  Assist level: Supervision/Verbal cueing Assistive device: Walker-rolling    Walk 150 feet activity   Assist Walk 150 feet activity did not occur: Safety/medical concerns  Assist level: Contact Guard/Touching assist Assistive device: Walker-rolling    Walk 10 feet on uneven surface  activity   Assist Walk 10 feet on uneven surfaces activity did not occur: Safety/medical concerns         Wheelchair     Assist Is the patient using a wheelchair?: Yes Type of Wheelchair: Manual    Wheelchair assist level: Supervision/Verbal cueing Max wheelchair distance: 150    Wheelchair 50 feet with 2 turns activity    Assist        Assist Level: Supervision/Verbal cueing   Wheelchair 150 feet activity     Assist      Assist Level: Supervision/Verbal  cueing   Blood pressure 133/85, pulse 82, temperature 98.6 F (37 C), temperature source Oral, resp. rate 16, height '5\' 9"'$  (1.753 m), weight 75.1 kg, SpO2 96 %.  Medical Problem List and Plan: 1. Functional deficits secondary to debility secondary to pelvic abscess.  Status post laparoscopy, abdominal washout drain placement with creation of diverting colostomy 03/20/2022             -patient may shower but incisions/drains should be covered             -ELOS/Goals: 10-14 days modI             -Continue PT/OT             -Expected discharge 6/14             -He missed some therapy this week due to IR procedure  2.  Antithrombotics: -DVT/anticoagulation:  Pharmaceutical: Lovenox.  Venous Doppler negative             -antiplatelet therapy: N/A 5/27 Continue Lovenox 3. Pain Management: Robaxin 1000 mg 3 times daily, tramadol as needed 5/31- Pain continues to be controlled, continue PRN tramadol 6/1 Discussed use of pain meds when needed to stay ahead of pain for therapy 6/5 Not using PRN pain medications, he reports pain is well controlled 6/8 He reports PRN tramadol helping to keep pain under control, helping with sleep and therapy tolerance 4. Mood: Ativan as needed as well as trazodone             -antipsychotic agents: N/A 5. Neuropsych: This patient is capable of making decisions on his own behalf. 6. Skin/Wound Care: Routine skin checks with colostomy education 7. Fluids/Electrolytes/Nutrition: Routine in and outs with follow-up chemistries 5/27 Slightly low sodium (133) and potassium (3.4), continue to trend, consider repleting 5/30 Na up to 135 and K wnl after repletion 6/4 No issues with electrolytes today Recheck labs tomorrow    Latest Ref Rng & Units 04/24/2022    4:41 AM 04/23/2022    5:03 AM 04/16/2022    4:34 AM  BMP  Glucose 70 - 99 mg/dL 96     BUN 8 - 23 mg/dL 17     Creatinine 0.61 - 1.24 mg/dL 0.91  0.76  0.93   Sodium 135 - 145 mmol/L 136     Potassium 3.5 - 5.1  mmol/L 4.1     Chloride 98 - 111 mmol/L 99     CO2 22 - 32 mmol/L 26     Calcium 8.9 - 10.3 mg/dL 9.1      6/10 Electrolytes normal with today's labs  8.  ID/intra-abdominal infection.  Continue micafungin 100 mg daily as well as Zosyn 3.375 g every 8 hours.  Plan is for 6 weeks starting date 5/16 ending 05/11/2022 and will need repeat CT abdomen/pelvis on left thigh in 1 to 2 weeks -5/27 Pt has event last night on 5/26 of temp 101.6, HR 115, new onset cough, with shortness of breath.  Per night nurse on 5/26-5/27 became diaphoretic around 0000 on 5/27 and drenched the bed with sweat, with no subsequent fevers or episodes of tachycardia. Orders were placed for CBC, BMET, blood cultures x2, CXR, COVID, respiratory panel, and repeat CT abd/pelvis. ID was consulted the evening of 5/26 with recommendation given to continue current regimen of 6 week Zosyn and micafungin based on original plan.  Per [place], CT abdomen/pelvis indicated with febrile event, especially in light that patient's last CT scan was 04/05/22 and he inadvertently lost one of his JP drains on the morning of 04/06/22. -Respiratory panel and COVID negative.  CXR shows Small left pleural effusion with atelectasis at the left lung base. -CT abd/pelvis: demonstrates a complex left retroperitoneal fluid collection, with superior and inferior components that appear to communicate overlying the Gerota's fascia and left psoas musculature. ID to round on pt on 04/11/22 and will decide next steps. 04/11/22 See ID and Radiology note.  IR was consulted for intra-abdominal fluid collection. Dr. Denna Haggard (IR) has approved patient for aspiration of inferior portion of fluid collection. -Per Radiology note, PA will reach out to General Surgery (Dr. Marcello Moores) to notify of plan. Also, called Dr. Rosendo Gros, MD on-call for General Surgery this weekend to update office on patient since surgery signed off on 04/09/22.  I was unable to reach provider since he was  in-procedure.  But relayed message by telephone to nurse, Karena Addison, [place] of patient's febrile episode 04/09/22-04/10/22 and plans for abdominal fluid removal and possible drain placement by IR. 5/29 IR completed aspiration of 39m of purulent material, no drain placed 5/29 ID continuing Zosyn, micafungin 5/31 culture of L flank abscess with GNR and gram pos cocci 6/1 ID changing zosyn to Ceftriaxone and Augmentin, continuing Micafungin, continue to follow 6/2 no fevers overnight, follow 6/5 new fluid collections, ID changed to imipenem, general surgery consult 6/8 surgery resutured his drains today 9.  Postoperative anemia.  Continue iron supplement.  Follow-up CBC 6/7 HBG 7.6 this AM, stable overall, continue to monitor Check labs tomorrow    Latest Ref Rng & Units 04/24/2022    4:41 AM 04/21/2022    4:58 AM 04/20/2022    4:36 AM  CBC  WBC 4.0 - 10.5 K/uL 11.4  10.5  10.8   Hemoglobin 13.0 - 17.0 g/dL 7.4  7.6  7.5   Hematocrit 39.0 - 52.0 % 23.6  24.1  23.5   Platelets 150 - 400 K/uL 378  336  327    6/10 Hgb 7.4 this am, overall stable but continue to trend. WBC's very mildly elevated, one instance of low grade fever 99.0 past 24 hours.  Discussed with Dr. SNaaman Plummer MD.  Repeat CBC for tomorrow am.  Continue to trend. 10.  Hypertension.  Continue Zebeta 10 mg nightly,   Monitor with increased mobility  BP well controlled, continue current medications Today's Vitals   04/23/22 2006 04/23/22 2113 04/24/22 0431 04/24/22 1620  BP: (!) 126/91  133/85   Pulse: 89  82   Resp: 20  16   Temp: 99 F (37.2 C)  98.6 F (37 C)   TempSrc: Oral  Oral   SpO2: 97%  96%   Weight:      Height:      PainSc:  0-No pain 0-No pain 0-No pain   Body mass index is 24.45 kg/m.  11.  Decreased nutritional storage.  Dietary follow-up. Continue Prosource.  12.  Urinary retention.  Foley tube removed and urinating.  Check PVR 5/27 Pt is voiding okay, reported that he had some urinary incontinence for the first day or  two immediately following Foley removal, which has since resolved. -Frequency of urination increased today, will check U/A and culture, possibly it is due to increased fluid intake 5/31 reports no difficulty voiding, follow  13. Hypokalemia             -5/29KCL today, repeat labs             -5/30 K+ wnl now             6/3 K+, remains wnl's, 4.0, on 5/30             Recheck labs tomorrow    6/10 K+, remains wnl's, 4.1 14. Hypoalbuminemia             -Continue ensure supplementation             -Discussed healthy diet, He ate all his breakfast today 5/30 15. Wound at lower buttocks site of previous drain             -5/20 WOC nurse consult recommends aquacel Ag with dry gauze and silicone foam. Continue to follow             -6/6 WOC noted increased R glueal wound drainage yesterday 6/3 Patient reports increased volume of greenish discharge from  left buttock wound saturating dressing.  Wound bed appears pinkish red length 3 cm, width 1.5 cm, depth not measured per patient request but appears about 1.5-2 cm.  WBC's normal, afebrile.  Called ID to make awake, spoke with on-call MD Laurice Record, MD.  Michela Pitcher that ID will round and follow-up on 04/18/22. 6/4 Wound site still is producing greenish drainage, ID was notified yesterday and again today with WBC spike of 15.0. ID to round on and follow up on patient. -6/5 CT abdomen/pelv and left femur completed.  May need additional aspiration/drain. ID has seen him and recommended surgical eval. I called surgery christopher white for eval, appreciate assistance -6/6 Surgery ordered IR evaluation, appreciate assistance 6/7 CT Guided aspiration completed yesterday with 20m fluid drained, continue to monitor 6/10 No acute surgical issues, seen by General Surgery yesterday, follow-up as outpatient next week. 16. Asthma,              -Using albuterol inhaler, monitor, improved today             -6/1 Reports breathing continues to be improved              6/8  Breathing continues to be improved, not requiring recent breathing treatments, continue to monitor 17. Leukocytosis             -6/6 WBC down to 10.8 today             -6/7 WBC 10.5 today, continue to follow             -recheck labs tomorrow 6/10 Mild elevation of WBC's from 10.5 (6/7) to 11.4 (6/10), single episode of temp at 99.0 in past 24 hrs, but overall afebrile.  Discussed with Dr. SNaaman Plummer MD.  Recommendation given for repeat CBC and continue to monitor for s/s infection.   LOS: 15 days A FACE TO FACE EVALUATION WAS PERFORMED  LLuetta Nutting6/08/2022, 2:50 PM

## 2022-04-25 LAB — CBC
HCT: 23.7 % — ABNORMAL LOW (ref 39.0–52.0)
Hemoglobin: 7.8 g/dL — ABNORMAL LOW (ref 13.0–17.0)
MCH: 30.2 pg (ref 26.0–34.0)
MCHC: 32.9 g/dL (ref 30.0–36.0)
MCV: 91.9 fL (ref 80.0–100.0)
Platelets: 366 10*3/uL (ref 150–400)
RBC: 2.58 MIL/uL — ABNORMAL LOW (ref 4.22–5.81)
RDW: 14.7 % (ref 11.5–15.5)
WBC: 10.2 10*3/uL (ref 4.0–10.5)
nRBC: 0 % (ref 0.0–0.2)

## 2022-04-25 LAB — URINE CULTURE: Culture: NO GROWTH

## 2022-04-25 NOTE — Progress Notes (Signed)
Physical Therapy Session Note  Patient Details  Name: Tyler [name] MRN: [id number] Date of Birth: 30-Oct-1951  Today's Date: 04/25/2022 PT Individual Time: 1450-1520 PT Individual Time Calculation (min): 30 min   Short Term Goals: Week 1:  PT Short Term Goal 1 (Week 1): Pt will transfer to bed with CGA PT Short Term Goal 1 - Progress (Week 1): Met PT Short Term Goal 2 (Week 1): Pt will ambulate 35f with CGA PT Short Term Goal 2 - Progress (Week 1): Met PT Short Term Goal 3 (Week 1): Pt will propell WC 107fwith supervision assist PT Short Term Goal 3 - Progress (Week 1): Met PT Short Term Goal 4 (Week 1): Pt will ascend 2 steps with min assist and BUE Support PT Short Term Goal 4 - Progress (Week 1): Met Week 2:  PT Short Term Goal 1 (Week 2): = LTGS due to LOS PT Short Term Goal 2 (Week 2): =LTGs due to LOS PT Short Term Goal 3 (Week 2): =LTGs due to LOS PT Short Term Goal 4 (Week 2): =LTGs due to LOS      Skilled Therapeutic Interventions/Progress Updates:    Pt resting in bed . He denied pain. Pt c/o HS tightness, and need to stretch before attempting ambulation.  Pt ed re : HS and HC stretching in long sitting, x 30 sec x 2 ,bil.  Also in sitting,  X 30 sec x 1 R/L  knee extension ; pt unable to tolerate ankle DF at the same time.   Supine>< sit with supervision without use of rails, HOB elevated.  Sit>< stand with bil hand pushing up on bed, CGA..  Gait x 150' with RW, without wc follow, CGA.  Pt was very happy to have ambulated that distance without having the wc behind him.   At end of session, pt resting in bed with needs at hand and bed alarm set.   Therapy Documentation Precautions:  Precautions Precautions: Fall Precaution Comments: drains, colostomy, rectal drainage Restrictions Weight Bearing Restrictions: No       Therapy/Group: Individual Therapy  Fatemah Pourciau 04/25/2022, 3:27 PM

## 2022-04-25 NOTE — Progress Notes (Signed)
Occupational Therapy Session Note  Patient Details  Name: Tyler [name] MRN: [id number] Date of Birth: 1951/10/19  Today's Date: 04/25/2022 OT Individual Time: 1300-1400 OT Individual Time Calculation (min): 60 min    Short Term Goals: Week 3:  OT Short Term Goal 1 (Week 3): STGs = LTGS  Therapy Documentation Precautions:  Precautions Precautions: Fall Precaution Comments: drains, colostomy, rectal drainage Restrictions Weight Bearing Restrictions: No General:   Upon OT arrival, pt semi recumbent in bed and reports no pain. Agreeable to OT session and states "I want to walk". Pt completes supine > sit transfer with SBA and sits EOB to donn shoes with increased time with setup assist. Pt performs sit>stand transfer with Supervision and ambulates to bathroom with Supervision to compete toilet transfer with Supervision. Pt ambulates to therapy gym ~150' with RW and Supervision and completes stand>sit transfer with Supervision. Pt completes 4 sit > stand transfers with Supervision and stands with CGA with Supervision to retreive bean bags in all planes and toss to cone located to L and R of pt to improve standing tolerance, balance, and endurance during ADLs. Pt requires short seated rest breaks during session and tolerates standing ~2-3 minutes each trial. 1 LOB observed and pt requires L UE support to RW but able to correct balance himself. Pt then sits EOM Independently to engage in zoom ball task completing gross motor movements including shoulder abduction/adduction for 2x15 reps with rest break in between trials to improve endurance and strength. Pt tolerates task well. Pt completes sit > stand transfer with RW and Supervision and ambulates back to room ~150' and returns to EOB with Supervision. Pt sits EOB to complete B UE exercises with 1lb wrist weights for 1x10 reps. Pt completes shoulder flex/ext and scapular prot/retr before requesting to lay down. Pt completes sit > supine transfer  with Supervision and HOB elevated slightly. Pt completes elbow flexion, chest press, supination/pronation, and wrist flex/ext for 1x10 reps. Pt left in semi recumbent with all safety measure in place. Pt limited by overall decreased endurance, standing balance, and strength and continues to benefit from OT services to maximize independence and safety during self care.    Therapy/Group: Individual Therapy  Marvetta Gibbons 04/25/2022, 2:51 PM

## 2022-04-25 NOTE — Progress Notes (Signed)
PROGRESS NOTE   Subjective/Complaints: Patient seen while transferring from bed to chair this morning.  WBC's today trending down to 10.2, had one episode of low grade fever yesterday evening of 99.4.  ROS: Denies chills shortness of breath chest pain palpitations, nausea vomiting constipation diarrhea. Positive for weakness had a single episode of low-grade fever in past 24 hours.   Objective:   No results found. Recent Labs    04/24/22 0441 04/25/22 0420  WBC 11.4* 10.2  HGB 7.4* 7.8*  HCT 23.6* 23.7*  PLT 378 366   Recent Labs    04/23/22 0503 04/24/22 0441  NA  --  136  K  --  4.1  CL  --  99  CO2  --  26  GLUCOSE  --  96  BUN  --  17  CREATININE 0.76 0.91  CALCIUM  --  9.1    Intake/Output Summary (Last 24 hours) at 04/25/2022 1335 Last data filed at 04/25/2022 0700 Gross per 24 hour  Intake 125 ml  Output 3245 ml  Net -3120 ml     Pressure Injury 04/09/22 Heel Left Deep Tissue Pressure Injury - Purple or maroon localized area of discolored intact skin or blood-filled blister due to damage of underlying soft tissue from pressure and/or shear. (Active)  04/09/22 1315  Location: Heel  Location Orientation: Left  Staging: Deep Tissue Pressure Injury - Purple or maroon localized area of discolored intact skin or blood-filled blister due to damage of underlying soft tissue from pressure and/or shear.  Wound Description (Comments):   Present on Admission: Yes     Pressure Injury 04/09/22 Heel Right Deep Tissue Pressure Injury - Purple or maroon localized area of discolored intact skin or blood-filled blister due to damage of underlying soft tissue from pressure and/or shear. (Active)  04/09/22 1315  Location: Heel  Location Orientation: Right  Staging: Deep Tissue Pressure Injury - Purple or maroon localized area of discolored intact skin or blood-filled blister due to damage of underlying soft tissue from  pressure and/or shear.  Wound Description (Comments):   Present on Admission: Yes    Physical Exam: Vital Signs Blood pressure (!) 137/93, pulse 71, temperature 98.7 F (37.1 C), temperature source Oral, resp. rate 16, height '5\' 9"'$  (1.753 m), weight 75.1 kg, SpO2 98 %.   General: Alert and oriented x 3, No apparent distress HEENT: Head is normocephalic, atraumatic, PERRLA, EOMI, sclera anicteric, oral mucosa pink and moist, dentition intact, ext ear canals clear,  Neck: Supple without JVD or lymphadenopathy Heart: Reg rate and rhythm. No murmurs rubs or gallops Chest: CTA bilaterally without wheezes, rales, or rhonchi; no distress Abdomen: Soft, nondistended, bowel sounds positive.  Minimal tenderness, colostomy viable with stool present. Comments: JP drains to milky looking drainage, subcu drain with milky drainage Skin: Stoma around colostomy intact.  Buttocks had left wound covered with dressing.  Heels without open wounds or redness this morning Extremities: No clubbing, cyanosis, or edema. Pulses are 2+ Psych: Pt's affect is appropriate. Pt is cooperative Neuro: Alert and oriented x3, CN II through XII intact no focal deficits. Musculoskeletal: Able to move all 4 extremities without limitations normal strength normal sensation  throughout.  Assessment/Plan: 1. Functional deficits which require 3+ hours per day of interdisciplinary therapy in a comprehensive inpatient rehab setting. Physiatrist is providing close team supervision and 24 hour management of active medical problems listed below. Physiatrist and rehab team continue to assess barriers to discharge/monitor patient progress toward functional and medical goals  Care Tool:  Bathing    Body parts bathed by patient: Right arm, Left arm, Chest, Abdomen, Face, Front perineal area, Right upper leg, Left upper leg, Right lower leg, Left lower leg, Buttocks   Body parts bathed by helper: Buttocks, Right lower leg, Left lower  leg     Bathing assist Assist Level: Supervision/Verbal cueing     Upper Body Dressing/Undressing Upper body dressing   What is the patient wearing?: Pull over shirt    Upper body assist Assist Level: Independent    Lower Body Dressing/Undressing Lower body dressing      What is the patient wearing?: Incontinence brief     Lower body assist Assist for lower body dressing: Supervision/Verbal cueing     Toileting Toileting    Toileting assist Assist for toileting: Minimal Assistance - Patient > 75% (urinal use- has colostomy with total A for care)     Transfers Chair/bed transfer  Transfers assist     Chair/bed transfer assist level: Supervision/Verbal cueing     Locomotion Ambulation   Ambulation assist      Assist level: Contact Guard/Touching assist Assistive device: Walker-rolling Max distance: 150'   Walk 10 feet activity   Assist     Assist level: Supervision/Verbal cueing Assistive device: Walker-rolling   Walk 50 feet activity   Assist Walk 50 feet with 2 turns activity did not occur: Safety/medical concerns  Assist level: Supervision/Verbal cueing Assistive device: Walker-rolling    Walk 150 feet activity   Assist Walk 150 feet activity did not occur: Safety/medical concerns  Assist level: Contact Guard/Touching assist Assistive device: Walker-rolling    Walk 10 feet on uneven surface  activity   Assist Walk 10 feet on uneven surfaces activity did not occur: Safety/medical concerns         Wheelchair     Assist Is the patient using a wheelchair?: Yes Type of Wheelchair: Manual    Wheelchair assist level: Supervision/Verbal cueing Max wheelchair distance: 150    Wheelchair 50 feet with 2 turns activity    Assist        Assist Level: Supervision/Verbal cueing   Wheelchair 150 feet activity     Assist      Assist Level: Supervision/Verbal cueing   Blood pressure (!) 137/93, pulse 71,  temperature 98.7 F (37.1 C), temperature source Oral, resp. rate 16, height '5\' 9"'$  (1.753 m), weight 75.1 kg, SpO2 98 %.  Medical Problem List and Plan: 1. Functional deficits secondary to debility secondary to pelvic abscess.  Status post laparoscopy, abdominal washout drain placement with creation of diverting colostomy 03/20/2022             -patient may shower but incisions/drains should be covered             -ELOS/Goals: 10-14 days modI             -Continue PT/OT             -Expected discharge 6/14             -He missed some therapy this week due to IR procedure 2.  Antithrombotics: -DVT/anticoagulation:  Pharmaceutical: Lovenox.  Venous Doppler negative             -  antiplatelet therapy: N/A 5/27 Continue Lovenox 3. Pain Management: Robaxin 1000 mg 3 times daily, tramadol as needed 5/31- Pain continues to be controlled, continue PRN tramadol 6/1 Discussed use of pain meds when needed to stay ahead of pain for therapy 6/5 Not using PRN pain medications, he reports pain is well controlled 6/8 He reports PRN tramadol helping to keep pain under control, helping with sleep and therapy tolerance 4. Mood: Ativan as needed as well as trazodone             -antipsychotic agents: N/A 5. Neuropsych: This patient is capable of making decisions on his own behalf. 6. Skin/Wound Care: Routine skin checks with colostomy education 7. Fluids/Electrolytes/Nutrition: Routine in and outs with follow-up chemistries 5/27 Slightly low sodium (133) and potassium (3.4), continue to trend, consider repleting 5/30 Na up to 135 and K wnl after repletion 6/4 No issues with electrolytes today Recheck labs tomorrow    Latest Ref Rng & Units 04/24/2022    4:41 AM 04/23/2022    5:03 AM 04/16/2022    4:34 AM  BMP  Glucose 70 - 99 mg/dL 96     BUN 8 - 23 mg/dL 17     Creatinine 0.61 - 1.24 mg/dL 0.91  0.76  0.93   Sodium 135 - 145 mmol/L 136     Potassium 3.5 - 5.1 mmol/L 4.1     Chloride 98 - 111 mmol/L 99      CO2 22 - 32 mmol/L 26     Calcium 8.9 - 10.3 mg/dL 9.1      6/11 Electrolytes normal with last labs 6/10  8.  ID/intra-abdominal infection.  Continue micafungin 100 mg daily as well as Zosyn 3.375 g every 8 hours.  Plan is for 6 weeks starting date 5/16 ending 05/11/2022 and will need repeat CT abdomen/pelvis on left thigh in 1 to 2 weeks -5/27 Pt has event last night on 5/26 of temp 101.6, HR 115, new onset cough, with shortness of breath.  Per night nurse on 5/26-5/27 became diaphoretic around 0000 on 5/27 and drenched the bed with sweat, with no subsequent fevers or episodes of tachycardia. Orders were placed for CBC, BMET, blood cultures x2, CXR, COVID, respiratory panel, and repeat CT abd/pelvis. ID was consulted the evening of 5/26 with recommendation given to continue current regimen of 6 week Zosyn and micafungin based on original plan.  Per [place], CT abdomen/pelvis indicated with febrile event, especially in light that patient's last CT scan was 04/05/22 and he inadvertently lost one of his JP drains on the morning of 04/06/22. -Respiratory panel and COVID negative.  CXR shows Small left pleural effusion with atelectasis at the left lung base. -CT abd/pelvis: demonstrates a complex left retroperitoneal fluid collection, with superior and inferior components that appear to communicate overlying the Gerota's fascia and left psoas musculature. ID to round on pt on 04/11/22 and will decide next steps. 04/11/22 See ID and Radiology note.  IR was consulted for intra-abdominal fluid collection. Dr. Denna Haggard (IR) has approved patient for aspiration of inferior portion of fluid collection. -Per Radiology note, PA will reach out to General Surgery (Dr. Marcello Moores) to notify of plan. Also, called Dr. Rosendo Gros, MD on-call for General Surgery this weekend to update office on patient since surgery signed off on 04/09/22.  I was unable to reach provider since he was in-procedure.  But relayed message by telephone to  nurse, Karena Addison, [place] of patient's febrile episode 04/09/22-04/10/22 and plans for abdominal fluid  removal and possible drain placement by IR. 5/29 IR completed aspiration of 26m of purulent material, no drain placed 5/29 ID continuing Zosyn, micafungin 5/31 culture of L flank abscess with GNR and gram pos cocci 6/1 ID changing zosyn to Ceftriaxone and Augmentin, continuing Micafungin, continue to follow 6/2 no fevers overnight, follow 6/5 new fluid collections, ID changed to imipenem, general surgery consult 6/8 surgery resutured his drains today 9.  Postoperative anemia.  Continue iron supplement.  Follow-up CBC 6/7 HBG 7.6 this AM, stable overall, continue to monitor Check labs tomorrow    Latest Ref Rng & Units 04/25/2022    4:20 AM 04/24/2022    4:41 AM 04/21/2022    4:58 AM  CBC  WBC 4.0 - 10.5 K/uL 10.2  11.4  10.5   Hemoglobin 13.0 - 17.0 g/dL 7.8  7.4  7.6   Hematocrit 39.0 - 52.0 % 23.7  23.6  24.1   Platelets 150 - 400 K/uL 366  378  336    6/10 Hgb 7.4 this am, overall stable but continue to trend. WBC's very mildly elevated, one instance of low grade fever 99.0 past 24 hours.  Discussed with Dr. SNaaman Plummer MD.  Repeat CBC for tomorrow am.  Continue to trend. 6/11 Hgb stable at 7.8, WBC's high normal at 10.2 10.  Hypertension.  Continue Zebeta 10 mg nightly,   Monitor with increased mobility  BP well controlled, continue current medications Today's Vitals   04/24/22 1943 04/24/22 1945 04/25/22 0038 04/25/22 0546  BP: 133/88   (!) 137/93  Pulse: 87   71  Resp: 18   16  Temp: 99.4 F (37.4 C)   98.7 F (37.1 C)  TempSrc: Oral   Oral  SpO2: 99%   98%  Weight:      Height:      PainSc: 0-No pain 0-No pain 8  0-No pain   Body mass index is 24.45 kg/m.  11.  Decreased nutritional storage.  Dietary follow-up. Continue [employer].  12.  Urinary retention.  Foley tube removed and urinating.  Check PVR 5/27 Pt is voiding okay, reported that he had some urinary incontinence for the first  day or two immediately following Foley removal, which has since resolved. -Frequency of urination increased today, will check U/A and culture, possibly it is due to increased fluid intake 5/31 reports no difficulty voiding, follow  13. Hypokalemia             -5/29KCL today, repeat labs             -5/30 K+ wnl now             6/3 K+, remains wnl's, 4.0, on 5/30             Recheck labs tomorrow    6/10 K+, remains wnl's, 4.1 14. Hypoalbuminemia             -Continue ensure supplementation             -Discussed healthy diet, He ate all his breakfast today 5/30 15. Wound at lower buttocks site of previous drain             -5/20 WOC nurse consult recommends aquacel Ag with dry gauze and silicone foam. Continue to follow             -6/6 WOC noted increased R glueal wound drainage yesterday 6/3 Patient reports increased volume of greenish discharge from left buttock wound saturating dressing.  Wound bed appears pinkish  red length 3 cm, width 1.5 cm, depth not measured per patient request but appears about 1.5-2 cm.  WBC's normal, afebrile.  Called ID to make awake, spoke with on-call MD Laurice Record, MD.  Michela Pitcher that ID will round and follow-up on 04/18/22. 6/4 Wound site still is producing greenish drainage, ID was notified yesterday and again today with WBC spike of 15.0. ID to round on and follow up on patient. -6/5 CT abdomen/pelv and left femur completed.  May need additional aspiration/drain. ID has seen him and recommended surgical eval. I called surgery christopher white for eval, appreciate assistance -6/6 Surgery ordered IR evaluation, appreciate assistance 6/7 CT Guided aspiration completed yesterday with 66m fluid drained, continue to monitor 6/11 No acute surgical issues, seen by General Surgery yesterday, follow-up as outpatient next week. 16. Asthma,              -Using albuterol inhaler, monitor, improved today             -6/1 Reports breathing continues to be improved               6/8 Breathing continues to be improved, not requiring recent breathing treatments, continue to monitor 17. Leukocytosis             -6/6 WBC down to 10.8 today             -6/7 WBC 10.5 today, continue to follow             -recheck labs tomorrow 6/10 Mild elevation of WBC's from 10.5 (6/7) to 11.4 (6/10), single episode of temp at 99.0 in past 24 hrs, but overall afebrile.  Discussed with Dr. SNaaman Plummer MD.  Recommendation given for repeat CBC and continue to monitor for s/s infection. 6/11 WBC's at 10.2, stable, continues to occasionally have episodes of low grade fever with one yesterday of 99.4, CTM.   LOS: 16 days A FACE TO FACE EVALUATION WAS PERFORMED  LLuetta Nutting6/09/2022, 1:35 PM

## 2022-04-26 ENCOUNTER — Other Ambulatory Visit: Payer: Self-pay | Admitting: Student

## 2022-04-26 ENCOUNTER — Other Ambulatory Visit (HOSPITAL_COMMUNITY): Payer: Self-pay

## 2022-04-26 LAB — SEDIMENTATION RATE: Sed Rate: 27 mm/hr — ABNORMAL HIGH (ref 0–16)

## 2022-04-26 LAB — AEROBIC/ANAEROBIC CULTURE W GRAM STAIN (SURGICAL/DEEP WOUND)

## 2022-04-26 MED ORDER — SODIUM CHLORIDE 0.9% FLUSH
5.0000 mL | Freq: Every day | INTRAVENOUS | 0 refills | Status: DC
  Filled 2022-04-26: qty 150, 30d supply, fill #0

## 2022-04-26 NOTE — Progress Notes (Signed)
PROGRESS NOTE   Subjective/Complaints: No new concerns this AM. Afebrile.   ROS No fevers, chills, SOB, CP, HA.   Objective:   No results found. Recent Labs    04/24/22 0441 04/25/22 0420  WBC 11.4* 10.2  HGB 7.4* 7.8*  HCT 23.6* 23.7*  PLT 378 366    Recent Labs    04/24/22 0441  NA 136  K 4.1  CL 99  CO2 26  GLUCOSE 96  BUN 17  CREATININE 0.91  CALCIUM 9.1     Intake/Output Summary (Last 24 hours) at 04/26/2022 0933 Last data filed at 04/26/2022 0830 Gross per 24 hour  Intake 604 ml  Output 1540 ml  Net -936 ml      Pressure Injury 04/09/22 Heel Left Deep Tissue Pressure Injury - Purple or maroon localized area of discolored intact skin or blood-filled blister due to damage of underlying soft tissue from pressure and/or shear. (Active)  04/09/22 1315  Location: Heel  Location Orientation: Left  Staging: Deep Tissue Pressure Injury - Purple or maroon localized area of discolored intact skin or blood-filled blister due to damage of underlying soft tissue from pressure and/or shear.  Wound Description (Comments):   Present on Admission: Yes     Pressure Injury 04/09/22 Heel Right Deep Tissue Pressure Injury - Purple or maroon localized area of discolored intact skin or blood-filled blister due to damage of underlying soft tissue from pressure and/or shear. (Active)  04/09/22 1315  Location: Heel  Location Orientation: Right  Staging: Deep Tissue Pressure Injury - Purple or maroon localized area of discolored intact skin or blood-filled blister due to damage of underlying soft tissue from pressure and/or shear.  Wound Description (Comments):   Present on Admission: Yes    Physical Exam: Vital Signs Blood pressure 139/90, pulse 79, temperature 98.2 F (36.8 C), temperature source Oral, resp. rate 20, height '5\' 9"'$  (1.753 m), weight 75.1 kg, SpO2 99 %.   General: Alert and oriented x 3, No apparent  distress HEENT: Head is normocephalic, atraumatic, PERRLA, EOMI, sclera anicteric, oral mucosa pink and moist, dentition intact, ext ear canals clear,  Neck: Supple without JVD or lymphadenopathy Heart: Reg rate and rhythm. No murmurs rubs or gallops Chest: CTA bilaterally without wheezes, rales, or rhonchi; no distress Abdomen: Soft, nondistended, bowel sounds positive.  Minimal tenderness, colostomy viable with stool present. Comments: JP drains with small amount milky looking drainage, subcu drain with milky drainage Skin: Stoma around colostomy intact.  Buttocks had left wound covered with dressing.  Heels without open wounds or redness this morning Extremities: No clubbing, cyanosis, or edema. Pulses are 2+ Psych: Pt's affect is appropriate. Pt is cooperative Neuro: Alert and oriented x3, CN II through XII intact no focal deficits. Musculoskeletal: Able to move all 4 extremities without limitations normal strength normal sensation throughout.  Assessment/Plan: 1. Functional deficits which require 3+ hours per day of interdisciplinary therapy in a comprehensive inpatient rehab setting. Physiatrist is providing close team supervision and 24 hour management of active medical problems listed below. Physiatrist and rehab team continue to assess barriers to discharge/monitor patient progress toward functional and medical goals  Care Tool:  Bathing  Body parts bathed by patient: Right arm, Left arm, Chest, Abdomen, Face, Front perineal area, Right upper leg, Left upper leg, Right lower leg, Left lower leg, Buttocks   Body parts bathed by helper: Buttocks, Right lower leg, Left lower leg     Bathing assist Assist Level: Supervision/Verbal cueing     Upper Body Dressing/Undressing Upper body dressing   What is the patient wearing?: Pull over shirt    Upper body assist Assist Level: Independent    Lower Body Dressing/Undressing Lower body dressing      What is the patient  wearing?: Incontinence brief     Lower body assist Assist for lower body dressing: Supervision/Verbal cueing     Toileting Toileting    Toileting assist Assist for toileting: Minimal Assistance - Patient > 75% (urinal use- has colostomy with total A for care)     Transfers Chair/bed transfer  Transfers assist     Chair/bed transfer assist level: Contact Guard/Touching assist     Locomotion Ambulation   Ambulation assist      Assist level: Contact Guard/Touching assist Assistive device: Walker-rolling Max distance: 150   Walk 10 feet activity   Assist     Assist level: Contact Guard/Touching assist Assistive device: Walker-rolling   Walk 50 feet activity   Assist Walk 50 feet with 2 turns activity did not occur: Safety/medical concerns  Assist level: Contact Guard/Touching assist Assistive device: Walker-rolling    Walk 150 feet activity   Assist Walk 150 feet activity did not occur: Safety/medical concerns  Assist level: Contact Guard/Touching assist Assistive device: Walker-rolling    Walk 10 feet on uneven surface  activity   Assist Walk 10 feet on uneven surfaces activity did not occur: Safety/medical concerns         Wheelchair     Assist Is the patient using a wheelchair?: Yes Type of Wheelchair: Manual    Wheelchair assist level: Supervision/Verbal cueing Max wheelchair distance: 150    Wheelchair 50 feet with 2 turns activity    Assist        Assist Level: Supervision/Verbal cueing   Wheelchair 150 feet activity     Assist      Assist Level: Supervision/Verbal cueing   Blood pressure 139/90, pulse 79, temperature 98.2 F (36.8 C), temperature source Oral, resp. rate 20, height '5\' 9"'$  (1.753 m), weight 75.1 kg, SpO2 99 %.  Medical Problem List and Plan: 1. Functional deficits secondary to debility secondary to pelvic abscess.  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  Antithrombotics: -DVT/anticoagulation:  Pharmaceutical: Lovenox.  Venous Doppler negative             -antiplatelet therapy: N/A 5/27 Continue Lovenox 3. Pain Management: Robaxin 1000 mg 3 times daily, tramadol as needed 5/31- Pain continues to be controlled, continue PRN tramadol 6/1 Discussed use of pain meds when needed to stay ahead of pain for therapy 6/5 Not using PRN pain medications, he reports pain is well controlled 6/8 He  reports PRN tramadol helping to keep pain under control, helping with sleep and therapy tolerance 4. Mood: Ativan as needed as well as trazodone             -antipsychotic agents: N/A 5. Neuropsych: This patient is capable of making decisions on his own behalf. 6. Skin/Wound Care: Routine skin checks with colostomy education 7. Fluids/Electrolytes/Nutrition: Routine in and outs with follow-up chemistries 5/27 Slightly low sodium (133) and potassium (3.4), continue to trend, consider repleting 5/30 Na up to 135 and K wnl after repletion 6/4 No issues with electrolytes today    Latest Ref Rng & Units 04/24/2022    4:41 AM 04/23/2022    5:03 AM 04/16/2022    4:34 AM  BMP  Glucose 70 - 99 mg/dL 96     BUN 8 - 23 mg/dL 17     Creatinine 0.61 - 1.24 mg/dL 0.91  0.76  0.93   Sodium 135 - 145 mmol/L 136     Potassium 3.5 - 5.1 mmol/L 4.1     Chloride 98 - 111 mmol/L 99     CO2 22 - 32 mmol/L 26     Calcium 8.9 - 10.3 mg/dL 9.1      6/11 Electrolytes normal with last labs 6/10  8.  ID/intra-abdominal infection.  Continue micafungin 100 mg daily as well as Zosyn 3.375 g every 8 hours.  Plan is for 6 weeks starting date 5/16 ending 05/11/2022 and will need repeat CT abdomen/pelvis on left thigh in 1 to 2  weeks -5/27 Pt has event last night on 5/26 of temp 101.6, HR 115, new onset cough, with shortness of breath.  Per night nurse on 5/26-5/27 became diaphoretic around 0000 on 5/27 and drenched the bed with sweat, with no subsequent fevers or episodes of tachycardia. Orders were placed for CBC, BMET, blood cultures x2, CXR, COVID, respiratory panel, and repeat CT abd/pelvis. ID was consulted the evening of 5/26 with recommendation given to continue current regimen of 6 week Zosyn and micafungin based on original plan.  Per [place], CT abdomen/pelvis indicated with febrile event, especially in light that patient's last CT scan was 04/05/22 and he inadvertently lost one of his JP drains on the morning of 04/06/22. -Respiratory panel and COVID negative.  CXR shows Small left pleural effusion with atelectasis at the left lung base. -CT abd/pelvis: demonstrates a complex left retroperitoneal fluid collection, with superior and inferior components that appear to communicate overlying the Gerota's fascia and left psoas musculature. ID to round on pt on 04/11/22 and will decide next steps. 04/11/22 See ID and Radiology note.  IR was consulted for intra-abdominal fluid collection. Dr. Denna Haggard (IR) has approved patient for aspiration of inferior portion of fluid collection. -Per Radiology note, PA will reach out to General Surgery (Dr. Marcello Moores) to notify of plan. Also, called Dr. Rosendo Gros, MD on-call for General Surgery this weekend to update office on patient since surgery signed off on 04/09/22.  I was unable to reach provider since he was in-procedure.  But relayed message by telephone to nurse, Karena Addison, [place] of patient's febrile episode 04/09/22-04/10/22 and plans for abdominal fluid removal and possible drain placement by IR. 5/29 IR completed aspiration of 55m of purulent material, no drain placed 5/29 ID continuing Zosyn, micafungin 5/31 culture of L flank abscess with GNR and gram pos cocci 6/1 ID changing zosyn to  Ceftriaxone and Augmentin, continuing Micafungin, continue to follow 6/2 no fevers overnight, follow 6/5 new fluid collections, ID  changed to imipenem, general surgery consult 6/8 surgery resutured his drains today 9.  Postoperative anemia.  Continue iron supplement.  Follow-up CBC 6/7 HBG 7.6 this AM, stable overall, continue to monitor    Latest Ref Rng & Units 04/25/2022    4:20 AM 04/24/2022    4:41 AM 04/21/2022    4:58 AM  CBC  WBC 4.0 - 10.5 K/uL 10.2  11.4  10.5   Hemoglobin 13.0 - 17.0 g/dL 7.8  7.4  7.6   Hematocrit 39.0 - 52.0 % 23.7  23.6  24.1   Platelets 150 - 400 K/uL 366  378  336    6/10 Hgb 7.4 this am, overall stable but continue to trend. WBC's very mildly elevated, one instance of low grade fever 99.0 past 24 hours.  Discussed with Dr. Naaman Plummer, MD.  Repeat CBC for tomorrow am.  Continue to trend. 6/11 Hgb stable at 7.8, WBC's high normal at 10.2 10.  Hypertension.  Continue Zebeta 10 mg nightly,   Monitor with increased mobility  -6/12 BP continues to be well controlled, no changes to current treatment Today's Vitals   04/25/22 1849 04/25/22 1948 04/25/22 2120 04/26/22 0351  BP:  (!) 137/98  139/90  Pulse:  79  79  Resp:  20  20  Temp:  98.5 F (36.9 C)  98.2 F (36.8 C)  TempSrc:  Oral  Oral  SpO2:  99%  99%  Weight:      Height:      PainSc: 0-No pain  0-No pain 0-No pain   Body mass index is 24.45 kg/m.  11.  Decreased nutritional storage.  Dietary follow-up. Continue Prosource.  12.  Urinary retention.  Foley tube removed and urinating.  Check PVR 5/27 Pt is voiding okay, reported that he had some urinary incontinence for the first day or two immediately following Foley removal, which has since resolved. -U/A neg and no growth on culture from 6/9  13. Hypokalemia             -5/29KCL today, repeat labs             -5/30 K+ wnl now             6/3 K+, remains wnl's, 4.0, on 5/30             Recheck labs tomorrow    6/10 K+, remains wnl's, 4.1 14.  Hypoalbuminemia             -Continue ensure supplementation             -Discussed healthy diet, He ate all his breakfast today 5/30 15. Wound at lower buttocks site of previous drain             -5/20 WOC nurse consult recommends aquacel Ag with dry gauze and silicone foam. Continue to follow             -6/6 WOC noted increased R glueal wound drainage yesterday 6/3 Patient reports increased volume of greenish discharge from left buttock wound saturating dressing.  Wound bed appears pinkish red length 3 cm, width 1.5 cm, depth not measured per patient request but appears about 1.5-2 cm.  WBC's normal, afebrile.  Called ID to make awake, spoke with on-call MD Laurice Record, MD.  Michela Pitcher that ID will round and follow-up on 04/18/22. 6/4 Wound site still is producing greenish drainage, ID was notified yesterday and again today with WBC spike of 15.0. ID to round on and follow up  on patient. -6/5 CT abdomen/pelv and left femur completed.  May need additional aspiration/drain. ID has seen him and recommended surgical eval. I called surgery christopher white for eval, appreciate assistance -6/6 Surgery ordered IR evaluation, appreciate assistance 6/7 CT Guided aspiration completed yesterday with 50m fluid drained, continue to monitor 6/11 No acute surgical issues, seen by General Surgery yesterday, follow-up as outpatient next week. 16. Asthma,              -Using albuterol inhaler, monitor, improved today             -6/1 Reports breathing continues to be improved              6/8 Breathing continues to be improved, not requiring recent breathing treatments, continue to monitor 17. Leukocytosis             -6/6 WBC down to 10.8 today             -6/7 WBC 10.5 today, continue to follow             -recheck labs tomorrow 6/10 Mild elevation of WBC's from 10.5 (6/7) to 11.4 (6/10), single episode of temp at 99.0 in past 24 hrs, but overall afebrile.  Discussed with Dr. SNaaman Plummer MD.  Recommendation given for  repeat CBC and continue to monitor for s/s infection. 6/11 WBC's at 10.2, stable, continues to occasionally have episodes of low grade fever with one yesterday of 99.4, CTM. 6/12 No fevers last night   LOS: 17 days A FACE TO FACE EVALUATION WAS PERFORMED  YJennye Boroughs6/10/2022, 9:33 AM

## 2022-04-26 NOTE — Progress Notes (Signed)
Referring Physician(s): Dr. 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   Most recent surgery on 03/20/22 abd washout and drain placement and creation of diverting colostomy.    IR interventions:  S/p left posterior thigh intramuscular abscess on 5/17 - Dr. Serafina Royals  S/p aspiration of left lower quadrant abscess on 5/29 - Dr. Denna Haggard S/p right TG drain placement by Dr. Serafina Royals on 04/20/22 - Dr. Serafina Royals   Subjective:  Patient sitting in wheelchair and working with OT at time of exam. No new complaints. Pt states that his team is planning for discharge on 6/14.  Allergies: Fluconazole, Griseofulvin, Sulfa antibiotics, Oxycodone, Mometasone furo-formoterol fum, and Retapamulin  Medications: Prior to Admission medications   Medication Sig Start Date End Date Taking? Authorizing Provider  acetaminophen (TYLENOL) 500 MG tablet Take 2 tablets (1,000 mg total) by mouth every 6 (six) hours as needed for mild pain. 02/22/80   Leighton Ruff, MD  albuterol (VENTOLIN HFA) 108 (90 Base) MCG/ACT inhaler Inhale 2 puffs into the lungs every 6 (six) hours as needed for wheezing or shortness of breath.    [provider]  amLODipine (NORVASC) 5 MG tablet TAKE 1 TABLET BY MOUTH DAILY Patient taking differently: Take 5 mg by mouth in the morning. 04/29/14   Ricard Dillon, MD  ascorbic acid (VITAMIN C) 500 MG tablet Take 1 tablet (500 mg total) by mouth 2 (two) times daily. 1/91/47   Leighton Ruff, MD  azelastine (ASTELIN) 0.1 % nasal spray Place 1 spray into both nostrils 2 (two) times daily as needed for rhinitis. Use in each nostril as directed    [provider]  bisoprolol (ZEBETA) 10 MG tablet Take 10 mg by mouth at  bedtime.    [provider]  diphenhydrAMINE (BENADRYL) 25 mg capsule Take 1 capsule (25 mg total) by mouth every 6 (six) hours as needed for itching, allergies or sleep (anxiety). 07/14/55   Leighton Ruff, MD  feeding supplement (ENSURE ENLIVE / ENSURE PLUS) LIQD Take 237 mLs by mouth 3 (three) times daily between meals. 12/28/06   Leighton Ruff, MD  ferrous sulfate 325 (65 FE) MG tablet Take 1 tablet (325 mg total) by mouth 2 (two) times daily with a meal. 6/57/84   Leighton Ruff, MD  ibuprofen (ADVIL) 400 MG tablet Take 1-2 tablets (400-800 mg total) by mouth every 8 (eight) hours as needed for fever, mild pain or cramping. 6/96/29   Leighton Ruff, MD  loperamide (IMODIUM) 2 MG capsule Take 1 capsule (2 mg total) by mouth with breakfast, with lunch, and with evening meal. 04/11/40   Leighton Ruff, MD  methocarbamol 1000 MG TABS Take 1,000 mg by mouth every 8 (eight) hours as needed for muscle spasms. 02/06/39   Leighton Ruff, MD  micafungin in sodium chloride 0.9 % 100 mL '100mg'$  IV q24h 11/16/70   Leighton Ruff, MD  montelukast (SINGULAIR) 10 MG tablet TAKE 1 TABLET BY MOUTH EVERY NIGHT AT BEDTIME Patient taking differently: Take 10 mg by mouth at bedtime. 09/10/13   Ricard Dillon, MD  Multiple Vitamin (MULTIVITAMIN WITH MINERALS) TABS tablet Take 1 tablet by mouth daily. 5/36/64   Leighton Ruff, MD  NEXIUM 40HK 20 MG capsule Take 20 mg by mouth daily before breakfast.  [provider]  ondansetron (ZOFRAN-ODT) 4 MG disintegrating tablet Take 1 tablet (4 mg total) by mouth every 6 (six) hours as needed for nausea. 1/44/31   Leighton Ruff, MD  piperacillin-tazobactam (ZOSYN) 3.375 GM/50ML IVPB Inject 50 mLs (3.375 g total) into the vein every 8 (eight) hours. 5/40/08   Leighton Ruff, MD  polycarbophil (FIBERCON) 625 MG tablet Take 1 tablet (625 mg total) by mouth 2 (two) times daily. 6/76/19   Leighton Ruff, MD  sodium chloride flush (NS) 0.9 % SOLN 10-40 mLs by  Intracatheter route every 12 (twelve) hours. 03/24/31   Leighton Ruff, MD  sodium chloride flush (NS) 0.9 % SOLN 10-40 mLs by Intracatheter route as needed (flush). 6/71/24   Leighton Ruff, MD  tadalafil (CIALIS) 10 MG tablet Take 10 mg by mouth daily as needed for erectile dysfunction.    [provider]  traMADol (ULTRAM) 50 MG tablet Take 1-2 tablets (50-100 mg total) by mouth every 6 (six) hours as needed for moderate pain or severe pain. 5/80/99   Leighton Ruff, MD  traZODone (DESYREL) 50 MG tablet Take 1 tablet (50 mg total) by mouth at bedtime as needed for sleep. 8/33/82   Leighton Ruff, MD  valACYclovir (VALTREX) 500 MG tablet Take 500 mg by mouth in the morning.    [provider]     Vital Signs: BP 139/90 (BP Location: Left Arm)   Pulse 79   Temp 98.2 F (36.8 C) (Oral)   Resp 20   Ht '5\' 9"'$  (1.753 m)   Wt 75.1 kg   SpO2 99%   BMI 24.45 kg/m   Physical Exam Constitutional:      Appearance: Normal appearance.  HENT:     Head: Normocephalic and atraumatic.  Pulmonary:     Effort: Pulmonary effort is normal. No respiratory distress.  Skin:    General: Skin is warm and dry.  Neurological:     General: No focal deficit present.     Mental Status: Tyler [name] is alert and oriented to person, place, and time.  Psychiatric:        Mood and Affect: Mood normal.        Behavior: Behavior normal.        Thought Content: Thought content normal.        Judgment: Judgment normal.    Drain Location: Transgluteal Size: Fr size: 12 Fr Date of placement: 04/20/22  Currently to: Drain collection device: suction bulb 24 hour output:  Output by Drain (mL) 04/24/22 0701 - 04/24/22 1900 04/24/22 1901 - 04/25/22 0700 04/25/22 0701 - 04/25/22 1900 04/25/22 1901 - 04/26/22 0700 04/26/22 0701 - 04/26/22 0920  Closed System Drain 1 Lateral;Right RLQ Bulb (JP) 19 Fr. '5 10  10   '$ Closed System Drain 2 Lateral;Left LLQ Bulb (JP) 19 Fr. 20 45  38   Closed System Drain 3  Inferior;Right Back Bulb (JP) 12 Fr. '5 30  25    '$ Current examination: Flushes/aspirates easily.  Cloudy tan fluid in bulb Insertion site unremarkable. Suture and stat lock in place. Dressed appropriately.   Imaging: No results found.   Labs:  CBC: Recent Labs    04/20/22 0436 04/21/22 0458 04/24/22 0441 04/25/22 0420  WBC 10.8* 10.5 11.4* 10.2  HGB 7.5* 7.6* 7.4* 7.8*  HCT 23.5* 24.1* 23.6* 23.7*  PLT 327 336 378 366     COAGS: Recent Labs    03/18/22 0617 04/12/22 0644 04/20/22 1521  INR 1.4* 1.2 1.2     BMP:  Recent Labs    03/23/22 0742 03/23/22 0904 04/10/22 0250 04/12/22 4656 04/13/22 0420 04/16/22 0434 04/23/22 0503 04/24/22 0441  NA QUESTIONABLE RESULTS, RECOMMEND RECOLLECT TO VERIFY   < > 133* 132* 135  --   --  136  K QUESTIONABLE RESULTS, RECOMMEND RECOLLECT TO VERIFY   < > 3.4* 3.4* 4.0  --   --  4.1  CL QUESTIONABLE RESULTS, RECOMMEND RECOLLECT TO VERIFY   < > 100 99 103  --   --  99  CO2 QUESTIONABLE RESULTS, RECOMMEND RECOLLECT TO VERIFY   < > '24 25 26  '$ --   --  26  GLUCOSE QUESTIONABLE RESULTS, RECOMMEND RECOLLECT TO VERIFY   < > 101* 98 105*  --   --  96  BUN QUESTIONABLE RESULTS, RECOMMEND RECOLLECT TO VERIFY   < > '12 8 10  '$ --   --  17  CALCIUM QUESTIONABLE RESULTS, RECOMMEND RECOLLECT TO VERIFY   < > 8.3* 8.2* 8.4*  --   --  9.1  CREATININE QUESTIONABLE RESULTS, RECOMMEND RECOLLECT TO VERIFY   < > 0.92 1.00 1.00 0.93 0.76 0.91  GFRNONAA QUESTIONABLE RESULTS, RECOMMEND RECOLLECT TO VERIFY   < > >60 >60 >60 >60 >60 >60  GFRAA QUESTIONABLE RESULTS, RECOMMEND RECOLLECT TO VERIFY  --   --   --   --   --   --   --    < > = values in this interval not displayed.     LIVER FUNCTION TESTS: Recent Labs    04/04/22 2333 04/08/22 0502 04/12/22 0644 04/13/22 0420  BILITOT 0.8 0.8 0.4 0.5  AST 51* '26 20 28  '$ ALT 55* 33 28 36  ALKPHOS 436* 357* 309* 287*  PROT 6.3* 6.6 6.3* 6.3*  ALBUMIN 1.9* 2.0* 1.9* 1.9*     Assessment and  Plan:  S/p right TG drain placement by Dr. Serafina Royals on 04/20/22.    Continue flushes as ordered and routine drain care.  Discharge planning: Pt states that discharge is being planned for this Wednesday, 04/28/22.  Discharge orders have been placed. Typically patient will follow up with IR clinic 10-14 days post d/c for repeat imaging/possible drain injection. IR scheduler will contact patient with date/time of appointment. Patient will need to flush drain QD with 5 cc NS, record output QD, dressing changes every 2-3 days or earlier if soiled.    IR will continue to follow - please call with questions or concerns.  Electronically Signed: Lura Em, PA 04/26/2022, 9:20 AM    I spent a total of 15 Minutes at the the patient's bedside AND on the patient's hospital floor or unit, greater than 50% of which was counseling/coordinating care for Follow up drain.

## 2022-04-26 NOTE — Consult Note (Signed)
[place] Nurse ostomy follow up Patient performed pouch change independently.  He could not see stoma to line up application of pouch, I assisted with that.  Stoma type/location:  LLQ colostomy Stomal assessment/size: 1 1/2"  Peristomal assessment:  Former drain site in LLQ near inguinal fold is red and fibrin noted in wound bed.  WIll cover with barrier ring as pouch contacts this area.  Treatment options for stomal/peristomal skin: 1 1/2 barrier rings, 1 piece convex Output  Thick pasty stool.  Is taking Immodium and fibercon.  Request to stop immodium.  Ostomy pouching: 1pc. convex Education provided: performed pouch change.  Enrolled patient in [place] Start Discharge program: Yes [place] Nurse wound follow up Wound type:Right gluteal wound Measurement: 2 cm x 1 cm x 3 cm  Wound MEQ:ASTM pink Drainage (amount, consistency, odor) minimal creamy effluent noted today Periwound:intact Dressing procedure/placement/frequency: Aquacel reapplied and covered with silicone foam dressing.   Will follow.  Domenic Moras MSN, RN, FNP-BC [facility]ON Wound, Ostomy, Continence Nurse Pager [phone]

## 2022-04-26 NOTE — Progress Notes (Signed)
Occupational Therapy Discharge Summary  Patient Details  Name: Tyler [name] MRN: [id number] Date of Birth: Mar 26, 1951     Patient has met 10 of 10 long term goals due to improved activity tolerance, improved balance, postural control, ability to compensate for deficits, and improved coordination.  Patient to discharge at overall Modified Independent level.   Patient's care partner is independent to provide the necessary physical assistance at discharge.  Pt and family education completed.   Reasons goals not met: n/a  Recommendation:  Patient will benefit from ongoing skilled OT services in home health setting to continue to advance functional skills in the area of iADL.  Equipment: No equipment provided - pt has all needed DME  Reasons for discharge: treatment goals met  Patient/family agrees with progress made and goals achieved: Yes  OT Discharge Precautions/Restrictions  Precautions Precautions: Fall Precaution Comments: drains, colostomy, rectal drainage Restrictions Weight Bearing Restrictions: No  ADL ADL Eating: Independent Where Assessed-Eating: Chair Grooming: Independent Where Assessed-Grooming: Chair Upper Body Bathing: Independent Where Assessed-Upper Body Bathing: Chair Lower Body Bathing: Setup Where Assessed-Lower Body Bathing: Chair Upper Body Dressing: Independent Where Assessed-Upper Body Dressing: Chair Lower Body Dressing: Modified independent Where Assessed-Lower Body Dressing: Chair Toileting: Setup Where Assessed-Toileting: Other (Comment) (colostomy care in chair or at toilet) Toilet Transfer: Modified independent Toilet Transfer Method: Counselling psychologist: Energy manager: Not assessed Social research officer, government:  (unable to shower at this time due to PICC line) ADL Comments: pt can now don clothing and shoes without AE Vision Baseline Vision/History: 1 Wears glasses Patient Visual Report: No change  from baseline Vision Assessment?: No apparent visual deficits Perception  Perception: Within Functional Limits Praxis Praxis: Intact Cognition Cognition Overall Cognitive Status: Within Functional Limits for tasks assessed Arousal/Alertness: Awake/alert Orientation Level: Person;Place;Situation Person: Oriented Place: Oriented Situation: Oriented Memory: Appears intact Attention: Focused;Sustained Focused Attention: Appears intact Sustained Attention: Appears intact Awareness: Appears intact Problem Solving: Appears intact Safety/Judgment: Appears intact Brief Interview for Mental Status (BIMS) Repetition of Three Words (First Attempt): 3 Temporal Orientation: Year: Correct Temporal Orientation: Month: Accurate within 5 days Temporal Orientation: Day: Correct Recall: "Sock": Yes, no cue required Recall: "Blue": Yes, no cue required Recall: "Bed": Yes, no cue required BIMS Summary Score: 15 Sensation Sensation Light Touch: Appears Intact Hot/Cold: Appears Intact Proprioception: Appears Intact Stereognosis: Appears Intact Coordination Gross Motor Movements are Fluid and Coordinated: Yes Fine Motor Movements are Fluid and Coordinated: Yes Motor  Motor Motor - Discharge Observations: generalized weakness and deconditioning    Trunk/Postural Assessment  Postural Control Righting Reactions: diminished due to prolonged bed rest and overal decondidtioning, weak core strength  Balance Static Sitting Balance Static Sitting - Level of Assistance: 7: Independent Dynamic Sitting Balance Dynamic Sitting - Level of Assistance: 7: Independent Static Standing Balance Static Standing - Level of Assistance: 6: Modified independent (Device/Increase time) Dynamic Standing Balance Dynamic Standing - Level of Assistance: 6: Modified independent (Device/Increase time) Extremity/Trunk Assessment RUE Assessment General Strength Comments: 4/5 RUE Strength RUE Overall Strength  Comments: 4/5 LUE Assessment General Strength Comments: 4/5 LUE Strength LUE Overall Strength Comments: 4/5   [place] 04/26/2022, 12:43 PM

## 2022-04-26 NOTE — Progress Notes (Signed)
Occupational Therapy Session Note  Patient Details  Name: Tyler [name] MRN: [id number] Date of Birth: [birth year]  Today's Date: 04/26/2022 OT Individual Time: 4098-1191 OT Individual Time Calculation (min): 70 min    Short Term Goals: Week 1:  OT Short Term Goal 1 (Week 1): Pt will complete grooming sink side level standing up to 4 minute intervals with brief intermittent rests seated as needed with close S OT Short Term Goal 1 - Progress (Week 1): Met OT Short Term Goal 2 (Week 1): Pt will complete toilet and shower bench transfers with CGA using grab bars OT Short Term Goal 2 - Progress (Week 1): Met (have just worked on toilet transfers) OT Short Term Goal 3 (Week 1): Pt will complete UB bathing and dressing with close S after set up OT Short Term Goal 3 - Progress (Week 1): Met OT Short Term Goal 4 (Week 1): Pt will complete LB bathing and dressing using AE as needed with min A OT Short Term Goal 4 - Progress (Week 1): Met OT Short Term Goal 5 (Week 1): Pt will verbally identify, express and employ 3/3 energy conservation strategies during ADL's, functional mobility and light UE ther ex//act with min VC's. OT Short Term Goal 5 - Progress (Week 1): Met Week 2:  OT Short Term Goal 1 (Week 2): STGs = LTGs OT Short Term Goal 1 - Progress (Week 2): Progressing toward goal Week 3:  OT Short Term Goal 1 (Week 3): STGs = LTGS  Skilled Therapeutic Interventions/Progress Updates:    Pt received up in w/c. Pt reported that he had completed all of his bathing and dressing before I arrived. Pt demonstrated to me that he can don his shoes himself and do his colostomy care. He only needed A to gather supplies and clean out container in toilet so set up with colostomy care. At home he will sit on toilet to empty out bag but in the hospital room, the bathroom is very cold and he does not have as much space to work in due to grab bar placement.  Assisted pt with washing his hair at  sink.  Functional mobility training:  Sit to stands with pushing up and NOT using RW for support Ambulation with and without Rw Strongly encouraged pt to use RW so he can focus on improved posture and gait pattern (leading with his heels). Without the RW, he steps forward on balls of feet and leads with his pelvis causing a back sway.  Pt able to practice gait pattern for 50 ft at a time but was getting fatigued easily.  Standing exercises of heel raises but needed to rest. Demonstrated otago exercises I want him to do at home but pt too fatigued. In next sessions, he can practice.   Pt transferred back to bed and resting in bed with all needs met.   Therapy Documentation Precautions:  Precautions Precautions: Fall Precaution Comments: drains, colostomy, rectal drainage Restrictions Weight Bearing Restrictions: No    Pain: c/o chronic low back pain    ADL: ADL Eating: Independent Where Assessed-Eating: Wheelchair Grooming: Independent Where Assessed-Grooming: Wheelchair Upper Body Bathing: Independent Where Assessed-Upper Body Bathing: Wheelchair Lower Body Bathing: Setup Where Assessed-Lower Body Bathing: Wheelchair Upper Body Dressing: Independent Where Assessed-Upper Body Dressing: Wheelchair Lower Body Dressing: Supervision/safety Where Assessed-Lower Body Dressing: Wheelchair Toileting: Supervision/safety Where Assessed-Toileting: Toilet (with empty ostomy bag at toilet) Toilet Transfer: Close supervision Toilet Transfer Method: Counselling psychologist: Energy manager: Not assessed [employer]  Shower Transfer:  (unable to shower at this time due to PICC line) ADL Comments: pt can now don clothing and shoes without AE    Therapy/Group: Individual Therapy  [place] 04/26/2022, 8:25 AM

## 2022-04-26 NOTE — Progress Notes (Addendum)
  [facility]    Date of Admission:  04/09/2022      ID: Tyler [name] is a 71 y.o. 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 Eventually underwent ex lap, colostomy. 2 drains placed udring surgery, but then also had transgluteal drain placed by IR on 6/6 for presacral fluid collection. Cx showing enterococcus, ecoli, c.albicans. patient hx of fluconazole allergy.  Principal Problem:   Abscess, intra-abdominal, postoperative Active Problems:   Debility   Sepsis due to Enterococcus (HCC)   E coli infection   Candida albicans infection   Leukocytosis   Pain in joint involving pelvic region and thigh    Subjective: Afebrile, feeling like he is tolerating PT more consistently. No pain at his abdomen. Has 3 drains in place, 1 with more milky collection.  Medications:   vitamin C  500 mg Oral BID   bisoprolol  10 mg Oral QHS   Chlorhexidine Gluconate Cloth  6 each Topical BID   enoxaparin (LOVENOX) injection  40 mg Subcutaneous Q24H   famotidine  20 mg Oral BID   feeding supplement  237 mL Oral TID BM   ferrous sulfate  325 mg Oral BID WC   lip balm  1 application  Topical BID   loperamide  2 mg Oral TID with meals   methocarbamol  1,000 mg Oral TID   montelukast  10 mg Oral QHS   multivitamin with minerals  1 tablet Oral Daily   pantoprazole  40 mg Oral Daily   polycarbophil  1,250 mg Oral BID   sodium chloride flush  10-40 mL Intracatheter Q12H   sodium chloride flush  5 mL Intracatheter Q8H    Objective: Vital signs in last 24 hours: Temp:  [98.2 F (36.8 C)-98.5 F (36.9 C)] 98.2 F (36.8 C) (06/12 0351) Pulse Rate:  [79-85] 79 (06/12 0351) Resp:  [17-20] 20 (06/12 0351) BP: (134-139)/(86-98) 139/90 (06/12  0351) SpO2:  [98 %-99 %] 99 % (06/12 0351)  Physical Exam  Constitutional: He is oriented to person, place, and time. He appears well-developed and well-nourished. No distress.  HENT:  Mouth/Throat: Oropharynx is clear and moist. No oropharyngeal exudate.  Cardiovascular: Normal rate, regular rhythm and normal heart sounds. Exam reveals no gallop and no friction rub.  No murmur heard.  Pulmonary/Chest: Effort normal and breath sounds normal. No respiratory distress. He has no wheezes.  Abdominal: Soft. Bowel sounds are normal. He exhibits no distension. There is no tenderness. Ostomy in place. 3 drains on right side  Ext+ right picc line is c/d/i Neurological: He is alert and oriented to person, place, and time.  Skin: Skin is warm and dry. No rash noted. No erythema.  Psychiatric: He has a normal mood and affect. His behavior is normal.    Lab Results Recent Labs    04/24/22 0441 04/25/22 0420  WBC 11.4* 10.2  HGB 7.4* 7.8*  HCT 23.6* 23.7*  NA 136  --   K 4.1  --   CL 99  --   CO2 26  --   BUN 17  --   CREATININE 0.91  --    Microbiology: Enterococcus faecalis Escherichia coli      MIC MIC    AMPICILLIN <=2 SENSITIVE  Sensitive >=32 RESIST... Resistant    AMPICILLIN/SULBACTAM   >=  32 RESIST... Resistant    CEFAZOLIN   <=4 SENSITIVE  Sensitive    CEFEPIME   <=0.12 SENS... Sensitive    CEFTAZIDIME   <=1 SENSITIVE  Sensitive    CEFTRIAXONE   <=0.25 SENS... Sensitive    CIPROFLOXACIN   <=0.25 SENS... Sensitive    GENTAMICIN   <=1 SENSITIVE  Sensitive    GENTAMICIN SYNERGY SENSITIVE  Sensitive      IMIPENEM   <=0.25 SENS... Sensitive    PIP/TAZO   <=4 SENSITIVE  Sensitive    TRIMETH/SULFA   <=20 SENSIT... Sensitive    VANCOMYCIN 1 SENSITIVE  Sensitive       Escherichia coli    MIC    AMPICILLIN >=32 RESIST... Resistant    AMPICILLIN/SULBACTAM >=32 RESIST... Resistant    CEFAZOLIN >=64 RESIST... Resistant    CEFEPIME 0.25 SENSIT... Sensitive    CEFTAZIDIME <=1  SENSITIVE  Sensitive    CEFTRIAXONE <=0.25 SENS... Sensitive    CIPROFLOXACIN <=0.25 SENS... Sensitive    GENTAMICIN <=1 SENSITIVE  Sensitive    IMIPENEM <=0.25 SENS... Sensitive    PIP/TAZO >=128 RESIS... Resistant    TRIMETH/SULFA <=20 SENSIT... Sensitive           Studies/Results: No results found.   Assessment/Plan: Polymicrobial intra-abdominal/pelvic, right gluteal and left thigh abscess = cx showing c.albicans, e.faecalis, and various e.coli isolates. He is currently on imipenem and micafungin. Plan to treat through 6/25.   He will need follow up in the IR drain clinic, with getting repeat abd/pelvis CT and left thigh CT prior to 6/25 He will also need follow up with general surgery team. We will see him back in the ID clinic prior to 6/25, to convert to oral abtx. Will check sed rate  Spent 50 min with patient in face to face and non-face to face time in terms of explanation of treatment plan. Will sign off. ------------------------------------------------------------------- Opat orders listed below:  Diagnosis: Intra-abdominal abscess and left thigh abscess  Culture Result: e.faecalis, e.coli. c.albicans  Allergies  Allergen Reactions   Fluconazole Hives, Rash and Other (See Comments)    Shingles activated    Griseofulvin Anaphylaxis, Swelling, Rash and Other (See Comments)    Throat swelling     Sulfa Antibiotics Other (See Comments)    Joints ache, swell and caused inflammation    Oxycodone Nausea Only and Other (See Comments)    Nauseous to the point of almost vomiting   Mometasone Furo-Formoterol Fum Other (See Comments)    Lack of therapeutic effect    Retapamulin Rash    OPAT Orders Discharge antibiotics to be given via PICC line Discharge antibiotics: Per pharmacy protocol micafungin and imipenem  Duration:  3 wk End Date: 05/09/2022  PIC Care Per Protocol:  Home health RN for IV administration and teaching; PICC line care and labs.     Labs weekly while on IV antibiotics: _x_ CBC with differential _x_ BMP __ CMP _x_ CRP _x_ ESR   x__ Please leave PIC in place until doctor has seen patient or been notified  Fax weekly labs to [phone]  Clinic Follow Up Appt: 2-3 wk  @ [facility]     [facility] Pager: [phone]  04/26/2022, 10:59 AM      

## 2022-04-26 NOTE — Progress Notes (Signed)
PHARMACY CONSULT NOTE FOR:  OUTPATIENT  PARENTERAL ANTIBIOTIC THERAPY (OPAT)  Indication: Intra-abdominal Infection Regimen: Primaxin 750 mg IV every 8 hours and Caspofungin 50 mg IV every 24 hours End date: 05/09/22  IV antibiotic discharge orders are pended. To discharging provider:  please sign these orders via discharge navigator,  Select New Orders & click on the button choice - Manage This Unsigned Work  Thank you for allowing pharmacy to be a part of this patient's care.  Alycia Rossetti, PharmD, BCPS Infectious Diseases Clinical Pharmacist 04/26/2022 10:59 AM   **Pharmacist phone directory can now be found on [email or web address] ([email or web address]).  Listed under [place].

## 2022-04-26 NOTE — Progress Notes (Addendum)
Occupational Therapy Session Note  Patient Details  Name: Tyler [name] MRN: [id number] Date of Birth: 1950-12-03  Today's Date: 04/26/2022 OT Individual Time: 1310-1405 OT Individual Time Calculation (min): 55 min    Short Term Goals: Week 3:  OT Short Term Goal 1 (Week 3): STGs = LTGS  Skilled Therapeutic Interventions/Progress Updates:    Pt semi reclined in bed, reports feeling overwhelmed by so many staff members giving him information all at once. Provided therapeutic use of self to reduce pts anxiety level and validate his feelings.  Pt agreeable to OT session with focus on strength and endurance.  Supine to sit with mod I.  Donned shoes with setup EOB.  Sit to stand and ambulation of ~100 feet to ADL kitchen using RW with assist for IV pole management and close supervision.  Pt sat in chair without arms with supervision for small rest break then completed heel raises and min squats at kitchen counter x 10 reps then returned to sitting with supervision.Verbal and visual cues needed to improve body mechanics. Stand pivot to chair with supervision then completed knee extensions seated.  He reported chair was too uncomfortable to complete therefore stand pivot to w/c with supervision and finished exercise with improved tolerance.  Ambulated 75 feet using RW from alternate kitchen door to bedroom with supervision.  Stand to sit at EOB and sit to supine with mod I.  Provided HEP with 3 therex completed during this session to promote carryover to avoid overwhelming pt.  Call bell in reach, bed alarm on.  Therapy Documentation Precautions:  Precautions Precautions: Fall Precaution Comments: drains, colostomy, rectal drainage Restrictions Weight Bearing Restrictions: No    Therapy/Group: Individual Therapy  [place]zekiel [place] 04/26/2022, 2:33 PM

## 2022-04-26 NOTE — Progress Notes (Signed)
Physical Therapy Session Note  Patient Details  Name: Tyler [name] MRN: [id number] Date of Birth: June 05, 1951  Today's Date: 04/26/2022 PT Individual Time: 1102-1200 PT Individual Time Calculation (min): 58 min   Short Term Goals: Week 1:  PT Short Term Goal 1 (Week 1): Pt will transfer to bed with CGA PT Short Term Goal 1 - Progress (Week 1): Met PT Short Term Goal 2 (Week 1): Pt will ambulate 58f with CGA PT Short Term Goal 2 - Progress (Week 1): Met PT Short Term Goal 3 (Week 1): Pt will propell WC 1056fwith supervision assist PT Short Term Goal 3 - Progress (Week 1): Met PT Short Term Goal 4 (Week 1): Pt will ascend 2 steps with min assist and BUE Support PT Short Term Goal 4 - Progress (Week 1): Met Week 2:  PT Short Term Goal 1 (Week 2): = LTGS due to LOS PT Short Term Goal 2 (Week 2): =LTGs due to LOS PT Short Term Goal 3 (Week 2): =LTGs due to LOS PT Short Term Goal 4 (Week 2): =LTGs due to LOS Week 3:     Skilled Therapeutic Interventions/Progress Updates:  Pt awaiting nursing for management of drains. Arrived/completed during therex. Supine therex: Bridging x 10 SLR x10 Supine to sit w/supervision. Sit to stand to RW w/supervision. Gait 125 x 2 w/RW w/close supervision.  Stairs: ascends/descends 4 stairs w/2 rails w/cga  Repeated Sit to stand x 5 w/cues for isolation of gluts/quads and eccentric control w/lowering.  Functional gait - practiced stepping over hockey sticks, around obstacles w/rw x 6044fperformed w/cues for sequencing stepping over obstacles, otherwise close supervision.  Standing balance: using single UE support, briefly no UE support pt reached to R to collect horseshoes from bedside table and transferred to low mat on L requiring controlled flexion at hips and knees for gentle placement, x 6 reps.  Mild dizzyness following task.  Gait as above back to room.  Turn/sit to edge of bed all w/supervision w/RW. Pt left supine w/rails up x 3, alarm  set, bed in lowest position, and needs in reach.  Requires seated rest between activites.  Continues to demonstrate improved endurance and stability w/dynamic activities.    Therapy Documentation Precautions:  Precautions Precautions: Fall Precaution Comments: drains, colostomy, rectal drainage Restrictions Weight Bearing Restrictions: No    Therapy/Group: Individual Therapy BarCallie FieldingT [place]12/2023, 12:02 PM

## 2022-04-27 ENCOUNTER — Other Ambulatory Visit (HOSPITAL_COMMUNITY): Payer: Self-pay

## 2022-04-27 DIAGNOSIS — D62 Acute posthemorrhagic anemia: Secondary | ICD-10-CM

## 2022-04-27 LAB — BASIC METABOLIC PANEL
Anion gap: 8 (ref 5–15)
BUN: 20 mg/dL (ref 8–23)
CO2: 25 mmol/L (ref 22–32)
Calcium: 9 mg/dL (ref 8.9–10.3)
Chloride: 101 mmol/L (ref 98–111)
Creatinine, Ser: 1 mg/dL (ref 0.61–1.24)
GFR, Estimated: >60 mL/min (ref >60)
Glucose, Bld: 113 mg/dL — ABNORMAL HIGH (ref 70–99)
Potassium: 3.8 mmol/L (ref 3.5–5.1)
Sodium: 134 mmol/L — ABNORMAL LOW (ref 135–145)

## 2022-04-27 MED ORDER — LORAZEPAM 0.5 MG PO TABS
0.5000 mg | ORAL_TABLET | Freq: Two times a day (BID) | ORAL | 0 refills | Status: DC | PRN
Start: 2022-04-27 — End: 2023-02-28
  Filled 2022-04-27: qty 20, 5d supply, fill #0

## 2022-04-27 MED ORDER — TRAMADOL HCL 50 MG PO TABS
50.0000 mg | ORAL_TABLET | Freq: Four times a day (QID) | ORAL | 0 refills | Status: DC | PRN
  Filled 2022-04-27 (×2): qty 30, 4d supply, fill #0

## 2022-04-27 MED ORDER — ESOMEPRAZOLE MAGNESIUM 20 MG PO CPDR
20.0000 mg | DELAYED_RELEASE_CAPSULE | Freq: Every day | ORAL | 0 refills | Status: AC
  Filled 2022-04-27 (×2): qty 30, 30d supply, fill #0

## 2022-04-27 MED ORDER — CALCIUM POLYCARBOPHIL 625 MG PO TABS
1250.0000 mg | ORAL_TABLET | Freq: Two times a day (BID) | ORAL | 0 refills | Status: DC
  Filled 2022-04-27: qty 60, 15d supply, fill #0

## 2022-04-27 MED ORDER — ALBUTEROL SULFATE HFA 108 (90 BASE) MCG/ACT IN AERS
2.0000 | INHALATION_SPRAY | Freq: Four times a day (QID) | RESPIRATORY_TRACT | 0 refills | Status: AC | PRN
  Filled 2022-04-27: qty 8.5, 23d supply, fill #0

## 2022-04-27 MED ORDER — IMIPENEM-CILASTATIN IV (FOR PTA / DISCHARGE USE ONLY): 750.0000 mg | Freq: Three times a day (TID) | INTRAVENOUS | 0 refills | Status: AC

## 2022-04-27 MED ORDER — FERROUS SULFATE 325 (65 FE) MG PO TABS
325.0000 mg | ORAL_TABLET | Freq: Two times a day (BID) | ORAL | 0 refills | Status: DC
  Filled 2022-04-27: qty 60, 30d supply, fill #0

## 2022-04-27 MED ORDER — CASPOFUNGIN IV (FOR PTA / DISCHARGE USE ONLY): 50.0000 mg | INTRAVENOUS | 0 refills | Status: AC

## 2022-04-27 MED ORDER — MONTELUKAST SODIUM 10 MG PO TABS
10.0000 mg | ORAL_TABLET | Freq: Every day | ORAL | 0 refills | Status: AC
  Filled 2022-04-27: qty 30, 30d supply, fill #0

## 2022-04-27 MED ORDER — ASCORBIC ACID 500 MG PO TABS
500.0000 mg | ORAL_TABLET | Freq: Two times a day (BID) | ORAL | 0 refills | Status: DC
  Filled 2022-04-27: qty 60, 30d supply, fill #0

## 2022-04-27 MED ORDER — MELATONIN 3 MG PO TABS
3.0000 mg | ORAL_TABLET | Freq: Every day | ORAL | Status: DC
  Administered 2022-04-27: 3 mg via ORAL
  Filled 2022-04-27: qty 1

## 2022-04-27 MED ORDER — METHOCARBAMOL 500 MG PO TABS
1000.0000 mg | ORAL_TABLET | Freq: Three times a day (TID) | ORAL | 0 refills | Status: DC
  Filled 2022-04-27: qty 180, 30d supply, fill #0

## 2022-04-27 MED ORDER — MELATONIN 3 MG PO TABS
3.0000 mg | ORAL_TABLET | Freq: Every day | ORAL | 0 refills | Status: DC
  Filled 2022-04-27: qty 30, 30d supply, fill #0

## 2022-04-27 MED ORDER — BISOPROLOL FUMARATE 10 MG PO TABS
10.0000 mg | ORAL_TABLET | Freq: Every day | ORAL | 0 refills | Status: AC
Start: 2022-04-27 — End: ?
  Filled 2022-04-27: qty 30, 30d supply, fill #0

## 2022-04-27 NOTE — Progress Notes (Signed)
PROGRESS NOTE   Subjective/Complaints:  He had some difficulty sleeping last night.   ROS No fevers, chills, SOB, CP, HA. No Nausea   Objective:   No results found. Recent Labs    04/25/22 0420  WBC 10.2  HGB 7.8*  HCT 23.7*  PLT 366    No results for input(s): "NA", "K", "CL", "CO2", "GLUCOSE", "BUN", "CREATININE", "CALCIUM" in the last 72 hours.   Intake/Output Summary (Last 24 hours) at 04/27/2022 0739 Last data filed at 04/27/2022 0530 Gross per 24 hour  Intake 597 ml  Output 2182.5 ml  Net -1585.5 ml      Pressure Injury 04/09/22 Heel Left Deep Tissue Pressure Injury - Purple or maroon localized area of discolored intact skin or blood-filled blister due to damage of underlying soft tissue from pressure and/or shear. (Active)  04/09/22 1315  Location: Heel  Location Orientation: Left  Staging: Deep Tissue Pressure Injury - Purple or maroon localized area of discolored intact skin or blood-filled blister due to damage of underlying soft tissue from pressure and/or shear.  Wound Description (Comments):   Present on Admission: Yes     Pressure Injury 04/09/22 Heel Right Deep Tissue Pressure Injury - Purple or maroon localized area of discolored intact skin or blood-filled blister due to damage of underlying soft tissue from pressure and/or shear. (Active)  04/09/22 1315  Location: Heel  Location Orientation: Right  Staging: Deep Tissue Pressure Injury - Purple or maroon localized area of discolored intact skin or blood-filled blister due to damage of underlying soft tissue from pressure and/or shear.  Wound Description (Comments):   Present on Admission: Yes    Physical Exam: Vital Signs Blood pressure (!) 136/92, pulse 78, temperature 98.5 F (36.9 C), resp. rate 19, height '5\' 9"'$  (1.753 m), weight 75.1 kg, SpO2 98 %.   General: Alert and oriented x 3, No apparent distress HEENT: Head is normocephalic,  atraumatic, PERRLA, EOMI, sclera anicteric, oral mucosa pink and moist, dentition intact Neck: Supple without JVD or lymphadenopathy Heart: Reg rate and rhythm. No murmurs rubs or gallops Chest: CTA bilaterally without wheezes, rales, or rhonchi; no distress Abdomen: Soft, nondistended, bowel sounds positive.  Minimal tenderness, colostomy viable with stool present. Comments: JP drains with small amount milky looking drainage, subcu drain with milky drainage Skin: Stoma around colostomy intact.  Buttocks had left wound covered with dressing.  Heels without open wounds or redness Extremities: No clubbing, cyanosis, or edema. Pulses are 2+ Psych: Pt's affect is appropriate. Pt is cooperative. Anxious  Neuro: Alert and oriented x3, CN II through XII intact no focal deficits. Musculoskeletal: Able to move all 4 extremities without limitations normal strength normal sensation throughout.  Assessment/Plan: 1. Functional deficits which require 3+ hours per day of interdisciplinary therapy in a comprehensive inpatient rehab setting. Physiatrist is providing close team supervision and 24 hour management of active medical problems listed below. Physiatrist and rehab team continue to assess barriers to discharge/monitor patient progress toward functional and medical goals  Care Tool:  Bathing    Body parts bathed by patient: Right arm, Left arm, Chest, Abdomen, Face, Front perineal area, Right upper leg, Left upper leg, Right lower leg,  Left lower leg, Buttocks   Body parts bathed by helper: Buttocks, Right lower leg, Left lower leg     Bathing assist Assist Level: Independent with assistive device     Upper Body Dressing/Undressing Upper body dressing   What is the patient wearing?: Pull over shirt    Upper body assist Assist Level: Independent    Lower Body Dressing/Undressing Lower body dressing      What is the patient wearing?: Incontinence brief, Pants     Lower body assist  Assist for lower body dressing: Independent with assitive device     Toileting Toileting    Toileting assist Assist for toileting: Set up assist     Transfers Chair/bed transfer  Transfers assist     Chair/bed transfer assist level: Independent with assistive device     Locomotion Ambulation   Ambulation assist      Assist level: Contact Guard/Touching assist Assistive device: Walker-rolling Max distance: 150   Walk 10 feet activity   Assist     Assist level: Contact Guard/Touching assist Assistive device: Walker-rolling   Walk 50 feet activity   Assist Walk 50 feet with 2 turns activity did not occur: Safety/medical concerns  Assist level: Contact Guard/Touching assist Assistive device: Walker-rolling    Walk 150 feet activity   Assist Walk 150 feet activity did not occur: Safety/medical concerns  Assist level: Contact Guard/Touching assist Assistive device: Walker-rolling    Walk 10 feet on uneven surface  activity   Assist Walk 10 feet on uneven surfaces activity did not occur: Safety/medical concerns         Wheelchair     Assist Is the patient using a wheelchair?: Yes Type of Wheelchair: Manual    Wheelchair assist level: Supervision/Verbal cueing Max wheelchair distance: 150    Wheelchair 50 feet with 2 turns activity    Assist        Assist Level: Supervision/Verbal cueing   Wheelchair 150 feet activity     Assist      Assist Level: Supervision/Verbal cueing   Blood pressure (!) 136/92, pulse 78, temperature 98.5 F (36.9 C), resp. rate 19, height '5\' 9"'$  (1.753 m), weight 75.1 kg, SpO2 98 %.  Medical Problem List and Plan: 1. Functional deficits secondary to debility secondary to pelvic abscess.  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  Antithrombotics: -DVT/anticoagulation:  Pharmaceutical: Lovenox.  Venous Doppler negative             -antiplatelet therapy: N/A 5/27 Continue Lovenox 3. Pain Management: Robaxin 1000 mg 3 times daily, tramadol as needed 5/31- Pain continues to be controlled, continue PRN tramadol 6/1 Discussed use of pain meds when needed to stay ahead of pain for therapy 6/5 Not using PRN pain medications, he reports pain is well controlled 6/8 He reports PRN tramadol helping to keep pain under control, helping with sleep and therapy tolerance 4. Mood: Ativan as needed as well as trazodone             -  antipsychotic agents: N/A 5. Neuropsych: This patient is capable of making decisions on his own behalf. 6. Skin/Wound Care: Routine skin checks with colostomy education 7. Fluids/Electrolytes/Nutrition: Routine in and outs with follow-up chemistries 5/27 Slightly low sodium (133) and potassium (3.4), continue to trend, consider repleting 5/30 Na up to 135 and K wnl after repletion    Latest Ref Rng & Units 04/24/2022    4:41 AM 04/23/2022    5:03 AM 04/16/2022    4:34 AM  BMP  Glucose 70 - 99 mg/dL 96     BUN 8 - 23 mg/dL 17     Creatinine 0.61 - 1.24 mg/dL 0.91  0.76  0.93   Sodium 135 - 145 mmol/L 136     Potassium 3.5 - 5.1 mmol/L 4.1     Chloride 98 - 111 mmol/L 99     CO2 22 - 32 mmol/L 26     Calcium 8.9 - 10.3 mg/dL 9.1      6/11 Electrolytes normal with last labs 6/10  8.  ID/intra-abdominal infection.  Continue micafungin 100 mg daily as well as Zosyn 3.375 g every 8 hours.  Plan is for 6 weeks starting date 5/16 ending 05/11/2022 and will need repeat CT abdomen/pelvis on left thigh in 1 to 2 weeks -5/27 Pt has event last night on 5/26 of temp 101.6, HR 115, new onset cough, with shortness of breath.  Per night nurse on 5/26-5/27 became diaphoretic around 0000 on 5/27 and drenched  the bed with sweat, with no subsequent fevers or episodes of tachycardia. Orders were placed for CBC, BMET, blood cultures x2, CXR, COVID, respiratory panel, and repeat CT abd/pelvis. ID was consulted the evening of 5/26 with recommendation given to continue current regimen of 6 week Zosyn and micafungin based on original plan.  Per [place], CT abdomen/pelvis indicated with febrile event, especially in light that patient's last CT scan was 04/05/22 and he inadvertently lost one of his JP drains on the morning of 04/06/22. -Respiratory panel and COVID negative.  CXR shows Small left pleural effusion with atelectasis at the left lung base. -CT abd/pelvis: demonstrates a complex left retroperitoneal fluid collection, with superior and inferior components that appear to communicate overlying the Gerota's fascia and left psoas musculature. ID to round on pt on 04/11/22 and will decide next steps. 04/11/22 See ID and Radiology note.  IR was consulted for intra-abdominal fluid collection. Dr. Denna Haggard (IR) has approved patient for aspiration of inferior portion of fluid collection. -Per Radiology note, PA will reach out to General Surgery (Dr. Marcello Moores) to notify of plan. Also, called Dr. Rosendo Gros, MD on-call for General Surgery this weekend to update office on patient since surgery signed off on 04/09/22.  I was unable to reach provider since he was in-procedure.  But relayed message by telephone to nurse, Karena Addison, [place] of patient's febrile episode 04/09/22-04/10/22 and plans for abdominal fluid removal and possible drain placement by IR. 5/29 IR completed aspiration of 23m of purulent material, no drain placed 5/29 ID continuing Zosyn, micafungin 5/31 culture of L flank abscess with GNR and gram pos cocci 6/1 ID changing zosyn to Ceftriaxone and Augmentin, continuing Micafungin, continue to follow 6/2 no fevers overnight, follow 6/5 new fluid collections, ID changed to imipenem, general surgery consult 6/8 surgery  resutured his drains today 6/13 Asked nursing to provide teaching on drain management 9.  Postoperative anemia.  Continue iron supplement.  Follow-up CBC 6/7 HBG 7.6 this AM, stable overall, continue to monitor  Latest Ref Rng & Units 04/25/2022    4:20 AM 04/24/2022    4:41 AM 04/21/2022    4:58 AM  CBC  WBC 4.0 - 10.5 K/uL 10.2  11.4  10.5   Hemoglobin 13.0 - 17.0 g/dL 7.8  7.4  7.6   Hematocrit 39.0 - 52.0 % 23.7  23.6  24.1   Platelets 150 - 400 K/uL 366  378  336    6/10 Hgb 7.4 this am, overall stable but continue to trend. WBC's very mildly elevated, one instance of low grade fever 99.0 past 24 hours.  Discussed with Dr. Naaman Plummer, MD.  Repeat CBC for tomorrow am.  Continue to trend. 6/11 Hgb stable at 7.8, WBC's high normal at 10.2 Recheck tomorrow 10.  Hypertension.  Continue Zebeta 10 mg nightly,   Monitor with increased mobility  -6/12 BP continues to be well controlled, no changes to current treatment Today's Vitals   04/26/22 1358 04/26/22 1937 04/26/22 2000 04/27/22 0410  BP: 134/86 (!) 145/93  (!) 136/92  Pulse: 85 84  78  Resp: '15 19  19  '$ Temp: 98.5 F (36.9 C) 98.2 F (36.8 C)  98.5 F (36.9 C)  TempSrc: Oral     SpO2: 100% 99%  98%  Weight:      Height:      PainSc:   0-No pain    Body mass index is 24.45 kg/m.  11.  Decreased nutritional storage.  Dietary follow-up. Continue Pro[employer].  12.  Urinary retention.  Foley tube removed and urinating.  Check PVR 5/27 Pt is voiding okay, reported that he had some urinary incontinence for the first day or two immediately following Foley removal, which has since resolved. -U/A neg and no growth on culture from 6/9  13. Hypokalemia             -5/29KCL today, repeat labs             -5/30 K+ wnl now             6/3 K+, remains wnl's, 4.0, on 5/30             Recheck labs tomorrow    6/10 K+, remains wnl's, 4.1  Recheck tomorrow 14. Hypoalbuminemia             -Continue ensure supplementation             -Discussed  healthy diet, He ate all his breakfast today 5/30 15. Wound at lower buttocks site of previous drain             -5/20 WOC nurse consult recommends aquacel Ag with dry gauze and silicone foam. Continue to follow             -6/6 WOC noted increased R glueal wound drainage yesterday 6/3 Patient reports increased volume of greenish discharge from left buttock wound saturating dressing.  Wound bed appears pinkish red length 3 cm, width 1.5 cm, depth not measured per patient request but appears about 1.5-2 cm.  WBC's normal, afebrile.  Called ID to make awake, spoke with on-call MD Laurice Record, MD.  Michela Pitcher that ID will round and follow-up on 04/18/22. 6/4 Wound site still is producing greenish drainage, ID was notified yesterday and again today with WBC spike of 15.0. ID to round on and follow up on patient. -6/5 CT abdomen/pelv and left femur completed.  May need additional aspiration/drain. ID has seen him and recommended surgical eval. I called surgery christopher white for eval,  appreciate assistance -6/6 Surgery ordered IR evaluation, appreciate assistance 6/7 CT Guided aspiration completed yesterday with 66m fluid drained, continue to monitor 6/11 No acute surgical issues, seen by General Surgery yesterday, follow-up as outpatient next week. 16. Asthma,              -Using albuterol inhaler, monitor, improved today             -6/1 Reports breathing continues to be improved              6/8 Breathing continues to be improved, not requiring recent breathing treatments, continue to monitor 17. Leukocytosis             -6/6 WBC down to 10.8 today             -6/7 WBC 10.5 today, continue to follow             -recheck labs tomorrow 6/10 Mild elevation of WBC's from 10.5 (6/7) to 11.4 (6/10), single episode of temp at 99.0 in past 24 hrs, but overall afebrile.  Discussed with Dr. SNaaman Plummer MD.  Recommendation given for repeat CBC and continue to monitor for s/s infection. 6/11 WBC's at 10.2, stable,  continues to occasionally have episodes of low grade fever with one yesterday of 99.4, CTM. -Recheck labs tomorrow 18. Insomnia  -Melatonin ordered today   LOS: 18 days A FACE TO FACE EVALUATION WAS PERFORMED  YJennye Boroughs6/13/2023, 7:39 AM

## 2022-04-27 NOTE — Progress Notes (Signed)
Patient's partner educated on dressing changes ; emptying of drains and dressing changes. Partner did return demo. No furthers questions noted.

## 2022-04-27 NOTE — Progress Notes (Signed)
Physical Therapy Discharge Summary  Patient Details  Name: Tyler [name] MRN: [id number] Date of Birth: 21-Jan-1951  Today's Date: 04/27/2022 PT Individual Time:  -       Patient has met 8 of 8 long term goals due to improved activity tolerance, improved postural control, increased strength, and decreased pain.  Patient to discharge at an ambulatory level Supervision.   Patient's care partner is independent to provide the necessary physical assistance at discharge.  Reasons goals not met: N/A all goals met  Recommendation:  Patient will benefit from ongoing skilled PT services in home health setting to continue to advance safe functional mobility, address ongoing impairments in strength, endurance,  balance, gait, functional mobility, and minimize fall risk.  Equipment: RW  Reasons for discharge: treatment goals met and discharge from hospital  Patient/family agrees with progress made and goals achieved: Yes  PT Discharge Precautions/Restrictions Precautions Precautions: Fall Precaution Comments: drains, colostomy, rectal drainage Restrictions Weight Bearing Restrictions: No Pain Interference Pain Interference Pain Effect on Sleep: 3. Frequently Pain Interference with Therapy Activities: 1. Rarely or not at all Pain Interference with Day-to-Day Activities: 1. Rarely or not at all Vision/Perception  Vision - History Ability to See in Adequate Light: 0 Adequate Perception Perception: Within Functional Limits Praxis Praxis: Intact  Cognition Overall Cognitive Status: Within Functional Limits for tasks assessed Arousal/Alertness: Awake/alert Orientation Level: Oriented X4 Focused Attention: Appears intact Memory: Appears intact Awareness: Appears intact Problem Solving: Appears intact Sensation Sensation Light Touch: Appears Intact Hot/Cold: Appears Intact Proprioception: Appears Intact Stereognosis: Appears Intact Motor  Motor Motor: Within Functional  Limits Motor - Discharge Observations: deconditioned but improved from eval  Mobility Bed Mobility Bed Mobility: Rolling Right;Rolling Left;Supine to Sit;Sit to Supine Rolling Right: Independent Rolling Left: Independent Right Sidelying to Sit: Independent Supine to Sit: Independent Sit to Supine: Independent Transfers Transfers: Sit to [employer] Transfers Sit to Stand: Independent with assistive device Stand Pivot Transfers: Independent with assistive device Transfer (Assistive device): Rolling walker Locomotion  Gait Ambulation: Yes Gait Assistance: Supervision/Verbal cueing Gait Distance (Feet): 150 Feet Assistive device: Rolling walker Gait Gait: Yes Gait Pattern: Within Functional Limits Stairs / Additional Locomotion Stairs: Yes Stairs Assistance: Supervision/Verbal cueing Stair Management Technique: Two rails Number of Stairs: 4 Height of Stairs: 6 Ramp: Independent with assistive device Wheelchair Mobility Wheelchair Mobility: No  Trunk/Postural Assessment  Cervical Assessment Cervical Assessment: Within Functional Limits Thoracic Assessment Thoracic Assessment: Within Functional Limits Lumbar Assessment Lumbar Assessment: Within Functional Limits Postural Control Postural Control: Deficits on evaluation  Balance Balance Balance Assessed: Yes Static Sitting Balance Static Sitting - Balance Support: Feet supported Static Sitting - Level of Assistance: 7: Independent Dynamic Sitting Balance Dynamic Sitting - Balance Support: Feet unsupported Dynamic Sitting - Level of Assistance: 7: Independent Static Standing Balance Static Standing - Balance Support: Bilateral upper extremity supported Static Standing - Level of Assistance: 6: Modified independent (Device/Increase time) Dynamic Standing Balance Dynamic Standing - Balance Support: Bilateral upper extremity supported Dynamic Standing - Level of Assistance: 6: Modified independent (Device/Increase  time) Dynamic Standing - Balance Activities: Reaching for objects Extremity Assessment  RUE Assessment General Strength Comments: 4/5 RUE Strength RUE Overall Strength Comments: 4/5 LUE Assessment LUE Assessment: Exceptions to [facility] General Strength Comments: 4/5 LUE Strength LUE Overall Strength: Deficits LUE Overall Strength Comments: 4/5 RLE Assessment RLE Assessment: Exceptions to [facility] RLE Strength Right Hip Flexion: 4+/5 Right Hip Extension: 4+/5 Right Hip ABduction: 4+/5 Right Hip ADduction: 4+/5 Right Knee Flexion: 4+/5 Right Knee Extension: 4+/5  Right Ankle Dorsiflexion: 4+/5 Right Ankle Plantar Flexion: 4+/5 LLE Assessment LLE Assessment: Exceptions to [facility] LLE Strength Left Hip Flexion: 4/5 Left Hip Extension: 4/5 Left Hip ABduction: 4/5 Left Hip ADduction: 4/5 Left Knee Flexion: 4/5 Left Knee Extension: 4/5 Left Ankle Dorsiflexion: 4/5 Left Ankle Plantar Flexion: 4/5    Rosita DeChalus 04/27/2022, 9:37 AM

## 2022-04-27 NOTE — Progress Notes (Signed)
Occupational Therapy Session Note  Patient Details  Name: Tyler [name] MRN: [id number] Date of Birth: 1951/04/05  Today's Date: 04/27/2022 OT Individual Time: 1115-1130 OT Individual Time Calculation (min): 15 min     Skilled Therapeutic Interventions/Progress Updates:    Pt asleep in bed upon OT arrival, reports he did not sleep well last night and politely requesting to defer this session in order to sleep due to fatigue.  Assessed BIMs (see dc summary) and reviewed HEP and dc planning and pt with no questions.  Call bell in reach, bed alarm on.  Therapy Documentation Precautions:  Precautions Precautions: Fall Precaution Comments: drains, colostomy, rectal drainage Restrictions Weight Bearing Restrictions: No    Therapy/Group: Individual Therapy  Ezekiel Slocumb 04/27/2022, 12:07 PM

## 2022-04-27 NOTE — Progress Notes (Signed)
Inpatient Rehabilitation Care Coordinator Discharge Note   Patient Details  Name: Tyler [name] MRN: [id number] Date of Birth: 1951-06-26   Discharge location: Home  Length of Stay: 19 Days  Discharge activity level: supervision  Home/community participation: home with partner  Patient response TM:[id number] Literacy - How often do you need to have someone help you when you read instructions, pamphlets, or other written material from your doctor or pharmacy?: Never  Patient response [id number] Isolation - How often do you feel lonely or isolated from those around you?: Never  Services provided included: SW, Pharmacy, TR, CM, RN, SLP, OT, PT, RD, MD  Financial Services:  Financial Services Utilized: [place] offered to/list presented to: patient  Follow-up services arranged:  [place]: Alvis Lemmings         Patient response to transportation need: Is the patient able to respond to transportation needs?: Yes In the past 12 months, has lack of transportation kept you from medical appointments or from getting medications?: No In the past 12 months, has lack of transportation kept you from meetings, work, or from getting things needed for daily living?: No    Comments (or additional information):  Patient/Family verbalized understanding of follow-up arrangements:  Yes  Individual responsible for coordination of the follow-up plan: Mitzi Hansen  Confirmed correct DME delivered: Dyanne Iha 04/27/2022    Dyanne Iha

## 2022-04-27 NOTE — TOC Benefit Eligibility Note (Signed)
Patient Advocate Encounter   Received notification from Pharmasist that prior authorization for Tramadol '50mg'$  tabs is required by his/her insurance [employer].   PA submitted on 04/27/22 Key [id number] PA Case ID: [id number] Status is pending

## 2022-04-27 NOTE — Progress Notes (Signed)
Occupational Therapy Session Note  Patient Details  Name: Tyler [name] MRN: [id number] Date of Birth: May 08, 1951  Today's Date: 04/27/2022 OT Individual Time: 1345-1455 OT Individual Time Calculation (min): 70 min    Short Term Goals: Week 2:  OT Short Term Goal 1 (Week 2): STGs = LTGs OT Short Term Goal 1 - Progress (Week 2): Progressing toward goal  Skilled Therapeutic Interventions/Progress Updates:    Patient received reclined in bed - talking with PM&R PA regarding discharge.  Patient and partner with multiple questions regarding discharge - discussed safety, management of ostomy, drains, and functional mobility.  Patient's partner has ordered Grab bar to be installed at home, reacher, toilet surround with arm rests.  Patient already has BSC and shower seat.  Discussed heavy oriental rugs were less problematic then small throw rugs, bathroom rugs.  Discussed sleeping positions - patient can sleep flat as on regular bed - but also encouraged to use pillows. Etc to lie partially on left/right side to help with pressure on sacrum/buttocks.  Patient and partner have been doing much of the care of ostomy, and wound - dressing changes.  Encouraged them to speak with nursing staff if they felt they needed additional practice with drains.   Patient agreeable to walking on unit with RW, needing assist for management of IV pole.  Practiced opening dresser drawers, doors, refrigerator, etc - walker safety in home living environment.  Patient returned to bed with partner present, and call bell, personal items in reach.    Therapy Documentation Precautions:  Precautions Precautions: Fall Precaution Comments: drains, colostomy, rectal drainage Restrictions Weight Bearing Restrictions: No  Pain:  No report of pain          Therapy/Group: Individual Therapy  Mariah Milling 04/27/2022, 3:33 PM

## 2022-04-27 NOTE — Progress Notes (Signed)
Physical Therapy Session Note  Patient Details  Name: Tyler [name] MRN: [id number] Date of Birth: [birth year]  Today's Date: 04/27/2022 PT Individual Time: 0905-1015 PT Individual Time Calculation (min): 70 min   Short Term Goals: Week 2:  PT Short Term Goal 1 (Week 2): = LTGS due to LOS PT Short Term Goal 2 (Week 2): =LTGs due to LOS PT Short Term Goal 3 (Week 2): =LTGs due to LOS PT Short Term Goal 4 (Week 2): =LTGs due to LOS  Skilled Therapeutic Interventions/Progress Updates: Pt presented in w/c agreeable to therapy. Pt states some mild pain in back but did not rate and rest breaks provided during session. Session focused on functional mobility in preparation for d/c. Pt ambulated to ortho gym with RW and distant supervision. Performed car transfer, ascending/descending ramp and ambulation on mulch with supervision. After seated rest ambulated to rehab gym and ascended/descending x 4 steps with B rails and supervision. Pt then participated in various balance activities including reaching tasks with and without AD. Pt also participated in obstacle course including weaving through cones, stepping over thresholds, and ambulation on compliant surface all performed with supervision. Pt then ambulated back to ortho gym and participated in NuStep L2 x 5 min for general endurance. Pt ambulated back to room and performed supine to sit to flat bed mod I. Pt repositioned self to comfort and left with call bell within reach and needs met.      Therapy Documentation Precautions:  Precautions Precautions: Fall Precaution Comments: drains, colostomy, rectal drainage Restrictions Weight Bearing Restrictions: No General:   Vital Signs:  Pain:   Mobility: Bed Mobility Bed Mobility: Rolling Right;Rolling Left;Supine to Sit;Sit to Supine Rolling Right: Independent Rolling Left: Independent Right Sidelying to Sit: Independent Supine to Sit: Independent Sit to Supine:  Independent Transfers Transfers: Sit to [employer] Transfers Sit to Stand: Independent with assistive device Stand Pivot Transfers: Independent with assistive device Transfer (Assistive device): Rolling walker Locomotion : Gait Ambulation: Yes Gait Assistance: Supervision/Verbal cueing Gait Distance (Feet): 150 Feet Assistive device: Rolling walker Gait Gait: Yes Gait Pattern: Within Functional Limits Stairs / Additional Locomotion Stairs: Yes Stairs Assistance: Supervision/Verbal cueing Stair Management Technique: Two rails Number of Stairs: 4 Height of Stairs: 6 Ramp: Independent with assistive device Wheelchair Mobility Wheelchair Mobility: No  Trunk/Postural Assessment : Cervical Assessment Cervical Assessment: Within Functional Limits Thoracic Assessment Thoracic Assessment: Within Functional Limits Lumbar Assessment Lumbar Assessment: Within Functional Limits Postural Control Postural Control: Deficits on evaluation  Balance: Balance Balance Assessed: Yes Static Sitting Balance Static Sitting - Balance Support: Feet supported Static Sitting - Level of Assistance: 7: Independent Dynamic Sitting Balance Dynamic Sitting - Balance Support: Feet unsupported Dynamic Sitting - Level of Assistance: 7: Independent Static Standing Balance Static Standing - Balance Support: Bilateral upper extremity supported Static Standing - Level of Assistance: 6: Modified independent (Device/Increase time) Dynamic Standing Balance Dynamic Standing - Balance Support: Bilateral upper extremity supported Dynamic Standing - Level of Assistance: 6: Modified independent (Device/Increase time) Dynamic Standing - Balance Activities: Reaching for objects Exercises:   Other Treatments:      Therapy/Group: Individual Therapy  Twain Stenseth 04/27/2022, 12:34 PM

## 2022-04-28 ENCOUNTER — Other Ambulatory Visit (HOSPITAL_COMMUNITY): Payer: Self-pay

## 2022-04-28 LAB — CBC WITH DIFFERENTIAL/PLATELET
Abs Immature Granulocytes: 0.12 10*3/uL — ABNORMAL HIGH (ref 0.00–0.07)
Basophils Absolute: 0 10*3/uL (ref 0.0–0.1)
Basophils Relative: 0 %
Eosinophils Absolute: 0.2 10*3/uL (ref 0.0–0.5)
Eosinophils Relative: 2 %
HCT: 24.9 % — ABNORMAL LOW (ref 39.0–52.0)
Hemoglobin: 8 g/dL — ABNORMAL LOW (ref 13.0–17.0)
Immature Granulocytes: 1 %
Lymphocytes Relative: 39 %
Lymphs Abs: 3.6 10*3/uL (ref 0.7–4.0)
MCH: 29.7 pg (ref 26.0–34.0)
MCHC: 32.1 g/dL (ref 30.0–36.0)
MCV: 92.6 fL (ref 80.0–100.0)
Monocytes Absolute: 0.7 10*3/uL (ref 0.1–1.0)
Monocytes Relative: 7 %
Neutro Abs: 4.7 10*3/uL (ref 1.7–7.7)
Neutrophils Relative %: 51 %
Platelets: 403 10*3/uL — ABNORMAL HIGH (ref 150–400)
RBC: 2.69 MIL/uL — ABNORMAL LOW (ref 4.22–5.81)
RDW: 15.3 % (ref 11.5–15.5)
WBC: 9.3 10*3/uL (ref 4.0–10.5)
nRBC: 0 % (ref 0.0–0.2)

## 2022-04-28 MED ORDER — HEPARIN SOD (PORK) LOCK FLUSH 100 UNIT/ML IV SOLN
250.0000 [IU] | INTRAVENOUS | Status: AC | PRN
  Administered 2022-04-28: 250 [IU]
  Filled 2022-04-28: qty 3

## 2022-04-28 NOTE — Progress Notes (Signed)
Inpatient Rehabilitation Discharge Medication Review by a Pharmacist  A complete drug regimen review was completed for this patient to identify any potential clinically significant medication issues.  High Risk Drug Classes Is patient taking? Indication by Medication  Antipsychotic No   Anticoagulant No   Antibiotic Yes Primaxin and Micafungin - intra-abdominal infection  Opioid Yes Tramadol - prn for pain  Antiplatelet No   Hypoglycemics/insulin No   Vasoactive Medication Yes Bisoprolol - HTN  Chemotherapy No   Other Yes Nexium - GERD Methocarbamol - muscle relaxer Melatonin - sleep Singulair - asthma Polycarbophil - laxative MVI, Vit C, Fe - supplements PRNs: Lorazepam - anxiety, sleep Tylenol - mild pain Ibuprofen - mild or cramping, fever Albuterol inhaler - shortness or breath of wheezing Astelin nasal spray - rhinitis Imodium - diarrhea     Type of Medication Issue Identified Description of Issue Recommendation(s)  Drug Interaction(s) (clinically significant)     Duplicate Therapy     Allergy     No Medication Administration End Date     Incorrect Dose     Additional Drug Therapy Needed     Significant med changes from prior encounter (inform family/care partners about these prior to discharge). Amlodipine stopped 04/03/22. Trazodone prn sleep changed to Melatonin 3 mg qhs   Other       Clinically significant medication issues were identified that warrant physician communication and completion of prescribed/recommended actions by midnight of the next day:  No  Pharmacist comments:  Primaxin and Micafungin thru 05/09/22 per ID.  Time spent performing this drug regimen review (minutes):  20   Arty Baumgartner, [place] 04/28/2022 8:12 AM

## 2022-04-28 NOTE — Progress Notes (Signed)
PROGRESS NOTE   Subjective/Complaints:  Anxious about going home today. Pt and his partner wanted to talk with surgery before he goes.   ROS No fevers, chills, SOB, CP, HA. No Nausea or vomiting.    Objective:   No results found. Recent Labs    04/28/22 0438  WBC 9.3  HGB 8.0*  HCT 24.9*  PLT 403*    Recent Labs    04/27/22 1433  NA 134*  K 3.8  CL 101  CO2 25  GLUCOSE 113*  BUN 20  CREATININE 1.00  CALCIUM 9.0     Intake/Output Summary (Last 24 hours) at 04/28/2022 0759 Last data filed at 04/28/2022 0200 Gross per 24 hour  Intake 592 ml  Output 2125 ml  Net -1533 ml      Pressure Injury 04/09/22 Heel Left Deep Tissue Pressure Injury - Purple or maroon localized area of discolored intact skin or blood-filled blister due to damage of underlying soft tissue from pressure and/or shear. (Active)  04/09/22 1315  Location: Heel  Location Orientation: Left  Staging: Deep Tissue Pressure Injury - Purple or maroon localized area of discolored intact skin or blood-filled blister due to damage of underlying soft tissue from pressure and/or shear.  Wound Description (Comments):   Present on Admission: Yes     Pressure Injury 04/09/22 Heel Right Deep Tissue Pressure Injury - Purple or maroon localized area of discolored intact skin or blood-filled blister due to damage of underlying soft tissue from pressure and/or shear. (Active)  04/09/22 1315  Location: Heel  Location Orientation: Right  Staging: Deep Tissue Pressure Injury - Purple or maroon localized area of discolored intact skin or blood-filled blister due to damage of underlying soft tissue from pressure and/or shear.  Wound Description (Comments):   Present on Admission: Yes    Physical Exam: Vital Signs Blood pressure (!) 133/91, pulse 72, temperature 98.3 F (36.8 C), resp. rate 17, height '5\' 9"'$  (1.753 m), weight 75.1 kg, SpO2 99 %.   General: Alert  and oriented x 3, No apparent distress, sitting in bed HEENT: Head is normocephalic, atraumatic, PERRLA, EOMI, sclera anicteric, oral mucosa pink and moist, dentition intact Neck: Supple without JVD or lymphadenopathy Heart: Reg rate and rhythm. No murmurs rubs or gallops Chest: CTA bilaterally without wheezes, rales, or rhonchi; no distress Abdomen: Soft, nondistended, bowel sounds positive.  Minimal tenderness, colostomy viable with stool present. Comments: JP drains with small amount milky looking drainage, subcu drain with milky drainage Skin: Stoma around colostomy intact.  Buttocks had left wound covered with dressing.  Heels without open wounds or redness Otherwise skin warm and dry Extremities: No clubbing, cyanosis, or edema. Pulses are 2+ Psych: Pt's affect is appropriate. Pt is cooperative. Anxious  Neuro: Alert and oriented x3, CN II through XII intact no focal deficits. Musculoskeletal: Able to move all 4 extremities without limitations normal strength normal sensation throughout.  Assessment/Plan: 1. Functional deficits which require 3+ hours per day of interdisciplinary therapy in a comprehensive inpatient rehab setting. Physiatrist is providing close team supervision and 24 hour management of active medical problems listed below. Physiatrist and rehab team continue to assess barriers to discharge/monitor patient progress  toward functional and medical goals  Care Tool:  Bathing    Body parts bathed by patient: Right arm, Left arm, Chest, Abdomen, Face, Front perineal area, Right upper leg, Left upper leg, Right lower leg, Left lower leg, Buttocks   Body parts bathed by helper: Buttocks, Right lower leg, Left lower leg     Bathing assist Assist Level: Independent with assistive device     Upper Body Dressing/Undressing Upper body dressing   What is the patient wearing?: Pull over shirt    Upper body assist Assist Level: Independent    Lower Body  Dressing/Undressing Lower body dressing      What is the patient wearing?: Incontinence brief, Pants     Lower body assist Assist for lower body dressing: Independent with assitive device     Toileting Toileting    Toileting assist Assist for toileting: Set up assist     Transfers Chair/bed transfer  Transfers assist     Chair/bed transfer assist level: Independent with assistive device Chair/bed transfer assistive device: Programmer, multimedia   Ambulation assist      Assist level: Independent with assistive device Assistive device: Walker-rolling Max distance: 150   Walk 10 feet activity   Assist     Assist level: Supervision/Verbal cueing Assistive device: Walker-rolling   Walk 50 feet activity   Assist Walk 50 feet with 2 turns activity did not occur: Safety/medical concerns  Assist level: Supervision/Verbal cueing Assistive device: Walker-rolling    Walk 150 feet activity   Assist Walk 150 feet activity did not occur: Safety/medical concerns  Assist level: Supervision/Verbal cueing Assistive device: Walker-rolling    Walk 10 feet on uneven surface  activity   Assist Walk 10 feet on uneven surfaces activity did not occur: Safety/medical concerns   Assist level: Supervision/Verbal cueing Assistive device: Walker-rolling   Wheelchair     Assist Is the patient using a wheelchair?: No Type of Wheelchair: Manual    Wheelchair assist level: Supervision/Verbal cueing Max wheelchair distance: 150    Wheelchair 50 feet with 2 turns activity    Assist        Assist Level: Supervision/Verbal cueing   Wheelchair 150 feet activity     Assist      Assist Level: Supervision/Verbal cueing   Blood pressure (!) 133/91, pulse 72, temperature 98.3 F (36.8 C), resp. rate 17, height '5\' 9"'$  (1.753 m), weight 75.1 kg, SpO2 99 %.  Medical Problem List and Plan: 1. Functional deficits secondary to debility secondary  to pelvic abscess.  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  Antithrombotics: -DVT/anticoagulation:  Pharmaceutical: Lovenox.  Venous Doppler negative             -antiplatelet therapy: N/A 5/27 Continue Lovenox 3. Pain Management: Robaxin 1000 mg 3 times daily, tramadol as needed 5/31- Pain continues to be controlled, continue PRN tramadol 6/1 Discussed use of pain meds when needed to stay ahead of pain for therapy 6/5 Not  using PRN pain medications, he reports pain is well controlled 6/8 He reports PRN tramadol helping to keep pain under control, helping with sleep and therapy tolerance 4. Mood: Ativan as needed as well as trazodone             -antipsychotic agents: N/A 5. Neuropsych: This patient is capable of making decisions on his own behalf. 6. Skin/Wound Care: Routine skin checks with colostomy education 7. Fluids/Electrolytes/Nutrition: Routine in and outs with follow-up chemistries 5/27 Slightly low sodium (133) and potassium (3.4), continue to trend, consider repleting 5/30 Na up to 135 and K wnl after repletion    Latest Ref Rng & Units 04/27/2022    2:33 PM 04/24/2022    4:41 AM 04/23/2022    5:03 AM  BMP  Glucose 70 - 99 mg/dL 113  96    BUN 8 - 23 mg/dL 20  17    Creatinine 0.61 - 1.24 mg/dL 1.00  0.91  0.76   Sodium 135 - 145 mmol/L 134  136    Potassium 3.5 - 5.1 mmol/L 3.8  4.1    Chloride 98 - 111 mmol/L 101  99    CO2 22 - 32 mmol/L 25  26    Calcium 8.9 - 10.3 mg/dL 9.0  9.1     6/11 Electrolytes normal with last labs 6/10  8.  ID/intra-abdominal infection.  Continue micafungin 100 mg daily as well as Zosyn 3.375 g every 8 hours.  Plan is for 6 weeks starting date  5/16 ending 05/11/2022 and will need repeat CT abdomen/pelvis on left thigh in 1 to 2 weeks -5/27 Pt has event last night on 5/26 of temp 101.6, HR 115, new onset cough, with shortness of breath.  Per night nurse on 5/26-5/27 became diaphoretic around 0000 on 5/27 and drenched the bed with sweat, with no subsequent fevers or episodes of tachycardia. Orders were placed for CBC, BMET, blood cultures x2, CXR, COVID, respiratory panel, and repeat CT abd/pelvis. ID was consulted the evening of 5/26 with recommendation given to continue current regimen of 6 week Zosyn and micafungin based on original plan.  Per [place], CT abdomen/pelvis indicated with febrile event, especially in light that patient's last CT scan was 04/05/22 and he inadvertently lost one of his JP drains on the morning of 04/06/22. -Respiratory panel and COVID negative.  CXR shows Small left pleural effusion with atelectasis at the left lung base. -CT abd/pelvis: demonstrates a complex left retroperitoneal fluid collection, with superior and inferior components that appear to communicate overlying the Gerota's fascia and left psoas musculature. ID to round on pt on 04/11/22 and will decide next steps. 04/11/22 See ID and Radiology note.  IR was consulted for intra-abdominal fluid collection. Dr. Denna Haggard (IR) has approved patient for aspiration of inferior portion of fluid collection. -Per Radiology note, PA will reach out to General Surgery (Dr. Marcello Moores) to notify of plan. Also, called Dr. Rosendo Gros, MD on-call for General Surgery this weekend to update office on patient since surgery signed off on 04/09/22.  I was unable to reach provider since he was in-procedure.  But relayed message by telephone to nurse, Karena Addison, [place] of patient's febrile episode 04/09/22-04/10/22 and plans for abdominal fluid removal and possible drain placement by IR. 5/29 IR completed aspiration of 12m of purulent material, no drain placed 5/29 ID continuing Zosyn, micafungin 5/31  culture of L flank abscess with GNR and gram pos cocci 6/1 ID changing zosyn to Ceftriaxone and Augmentin, continuing Micafungin,  continue to follow 6/2 no fevers overnight, follow 6/5 new fluid collections, ID changed to imipenem, general surgery consult 6/8 surgery resutured his drains today 6/13 Asked nursing to provide teaching on drain management 6/14 Pt asked for surgery to evaluate before he goes home, Alferd Apa PA contacted who came by and felt he is OK for discharge home, Appreciate assistance 9.  Postoperative anemia.  Continue iron supplement.  Follow-up CBC 6/7 HBG 7.6 this AM, stable overall, continue to monitor    Latest Ref Rng & Units 04/28/2022    4:38 AM 04/25/2022    4:20 AM 04/24/2022    4:41 AM  CBC  WBC 4.0 - 10.5 K/uL 9.3  10.2  11.4   Hemoglobin 13.0 - 17.0 g/dL 8.0  7.8  7.4   Hematocrit 39.0 - 52.0 % 24.9  23.7  23.6   Platelets 150 - 400 K/uL 403  366  378    6/10 Hgb 7.4 this am, overall stable but continue to trend. WBC's very mildly elevated, one instance of low grade fever 99.0 past 24 hours.  Discussed with Dr. Naaman Plummer, MD.  Repeat CBC for tomorrow am.  Continue to trend. 6/11 Hgb stable at 7.8, WBC's high normal at 10.2 Recheck tomorrow 10.  Hypertension.  Continue Zebeta 10 mg nightly,   Monitor with increased mobility  -6/12 BP continues to be well controlled, no changes to current treatment Today's Vitals   04/27/22 0410 04/27/22 1952 04/27/22 2039 04/28/22 0544  BP: (!) 136/92 139/89  (!) 133/91  Pulse: 78 79  72  Resp: '19 17  17  '$ Temp: 98.5 F (36.9 C) 98.3 F (36.8 C)  98.3 F (36.8 C)  TempSrc:      SpO2: 98% 98%  99%  Weight:      Height:      PainSc:   0-No pain    Body mass index is 24.45 kg/m.  11.  Decreased nutritional storage.  Dietary follow-up. Continue [employer].  12.  Urinary retention.  Foley tube removed and urinating.  Check PVR 5/27 Pt is voiding okay, reported that he had some urinary incontinence for the first day  or two immediately following Foley removal, which has since resolved. -U/A neg and no growth on culture from 6/9  13. Hypokalemia             -5/29KCL today, repeat labs             -5/30 K+ wnl now             6/3 K+, remains wnl's, 4.0, on 5/30             Recheck labs tomorrow    6/10 K+, remains wnl's, 4.1 6/14 K+ stable today, follow with PCP 14. Hypoalbuminemia             -Continue ensure supplementation             -Discussed healthy diet, He ate all his breakfast today 5/30 15. Wound at lower buttocks site of previous drain             -5/20 WOC nurse consult recommends aquacel Ag with dry gauze and silicone foam. Continue to follow             -6/6 WOC noted increased R glueal wound drainage yesterday 6/3 Patient reports increased volume of greenish discharge from left buttock wound saturating dressing.  Wound bed appears pinkish red length 3 cm, width 1.5 cm, depth not measured per  patient request but appears about 1.5-2 cm.  WBC's normal, afebrile.  Called ID to make awake, spoke with on-call MD Laurice Record, MD.  Michela Pitcher that ID will round and follow-up on 04/18/22. 6/4 Wound site still is producing greenish drainage, ID was notified yesterday and again today with WBC spike of 15.0. ID to round on and follow up on patient. -6/5 CT abdomen/pelv and left femur completed.  May need additional aspiration/drain. ID has seen him and recommended surgical eval. I called surgery christopher white for eval, appreciate assistance -6/6 Surgery ordered IR evaluation, appreciate assistance 6/7 CT Guided aspiration completed yesterday with 73m fluid drained, continue to monitor 6/11 No acute surgical issues, seen by General Surgery yesterday, follow-up as outpatient next week. 16. Asthma,              -Using albuterol inhaler, monitor, improved today             -6/1 Reports breathing continues to be improved              6/8 Breathing continues to be improved, not requiring recent breathing  treatments, continue to monitor 17. Leukocytosis             -6/6 WBC down to 10.8 today             -6/7 WBC 10.5 today, continue to follow             -recheck labs tomorrow 6/10 Mild elevation of WBC's from 10.5 (6/7) to 11.4 (6/10), single episode of temp at 99.0 in past 24 hrs, but overall afebrile.  Discussed with Dr. SNaaman Plummer MD.  Recommendation given for repeat CBC and continue to monitor for s/s infection. 6/11 WBC's at 10.2, stable, continues to occasionally have episodes of low grade fever with one yesterday of 99.4, CTM. -WBC 9.3 today 18. Insomnia  -Melatonin ordered today   LOS: 19 days A FACE TO FACE EVALUATION WAS PERFORMED  YJennye Boroughs6/14/2023, 7:59 AM

## 2022-04-28 NOTE — Progress Notes (Signed)
Patient ID: Tyler [name], male   DOB: 06/09/1951, 71 y.o.   MRN: [id number]  Patient [name] orders sent to [facility]

## 2022-04-29 ENCOUNTER — Other Ambulatory Visit: Payer: Self-pay | Admitting: Internal Medicine

## 2022-04-29 DIAGNOSIS — T8143XA Infection following a procedure, organ and space surgical site, initial encounter: Secondary | ICD-10-CM

## 2022-04-29 NOTE — TOC Benefit Eligibility Note (Signed)
Patient Advocate Encounter  Prior Authorization for traMADol HCl '50MG'$  tablets has been approved.    PA# [id number] Effective dates: 04/27/2022 through 05/27/2022      Lyndel Safe, [place] Patient Advocate Specialist [place] Patient Advocate Team Direct Number: [phone]  Fax: [phone]

## 2022-04-30 ENCOUNTER — Ambulatory Visit (HOSPITAL_COMMUNITY)
Admission: RE | Admit: 2022-04-30 | Discharge: 2022-04-30 | Disposition: A | Discharge status: Home / Self Care | Payer: BC Managed Care – PPO | Source: Ambulatory Visit | Attending: Infectious Diseases | Admitting: Infectious Diseases

## 2022-04-30 ENCOUNTER — Telehealth: Payer: Self-pay

## 2022-04-30 DIAGNOSIS — M79662 Pain in left lower leg: Secondary | ICD-10-CM

## 2022-04-30 NOTE — Telephone Encounter (Signed)
Spoke with [name], notified him that his [place] was negative for DVT. Recommended he still update his surgery team about the changes in his leg. Patient verbalized understanding and has no further questions.   Beryle Flock, RN

## 2022-04-30 NOTE — Telephone Encounter (Signed)
Unfortunately I am not familiar with him, but in reviewing his chart I would suspect the dry metallic taste is due either to the saline flushes (which I have heard a lot before) or the antibiotic. Either way, it would be a side effect that I would expect to resolve once he is done with treatment. Biotene rinses / lozenges may help with that but for some nothing helps much.   Regarding the leg swelling, I will order a stat venous doppler of his left leg to ensure no blood clot but in speaking with Caren Griffins he had some extensive infection into the thigh from surgery. I would ask he notify the surgeon team as well if not done already.   [name] can you see if we can schedule a STAT study for the dopplers? If not able to get them done timely would refer to ER for quicker evaluation if it seems to be getting worse.    Janene Madeira, MSN, NP-C [facility] [email or web address] Pager: [phone] Office: [phone] [facility] Main Line: [place] Communication Welcome

## 2022-04-30 NOTE — Telephone Encounter (Signed)
Spoke with patient, discussed that dry metallic taste is likely a side effect of antibiotics, but should resolve once treatment is completed. Advised that [name] recommends Biotene rinse or lozenges.   Relayed that staff is working on getting doppler US scheduled to rule out blood clot, he is fine with any time for the appointment. Advised him to notify his surgeon of his leg swelling as well.   [name] is initiating authorization. Discussed with [name] that if his swelling worsens before he is able to be seen for the scheduled [place] that he will need to go to the ED for quicker evaluation. Patient verbalized understanding and has no further questions.   He will await our call in regards to [place] appointment.   Beryle Flock, RN

## 2022-04-30 NOTE — Telephone Encounter (Signed)
Patient called, states he was discharged from the hospital 2 days ago and woke up this morning with his left calf slightly swollen/puffy. He reports a tingling sensation in the left leg and foot. Denies any redness or warmth in the extremity. Denies fever or chills. He did place a compression sock on the leg when he woke up and is wondering if the swelling is just due to his lack of movement during the night while sleeping.   He took his BP at home and reports that it was 120/80.  He also reports a "dry metallic" taste in his mouth and states nothing has been able to relieve the dryness. He is wondering if this is a side effect of the imipenem or micafungin. Will route to provider.   Beryle Flock, RN

## 2022-04-30 NOTE — Addendum Note (Signed)
Addended by: [address] Callas on: 04/30/2022 09:46 AM   Modules accepted: Orders

## 2022-04-30 NOTE — Telephone Encounter (Signed)
Patient scheduled for doppler this afternoon, he requested appointment info be sent via [place].   He typically does his antibiotic infusion at 3PM, advised him it's okay to do this a little earlier so that he can finish the infusion prior to his appointment at [facility].   Per [employer], no prior auth required (ref # [id number]) - provided this info to [facility] scheduling.   Beryle Flock, RN

## 2022-05-04 ENCOUNTER — Ambulatory Visit
Admission: RE | Admit: 2022-05-04 | Discharge: 2022-05-04 | Disposition: A | Discharge status: Home / Self Care | Payer: BC Managed Care – PPO | Source: Ambulatory Visit | Attending: Radiology | Admitting: Radiology

## 2022-05-04 ENCOUNTER — Encounter: Payer: Self-pay | Admitting: *Deleted

## 2022-05-04 ENCOUNTER — Ambulatory Visit
Admission: RE | Admit: 2022-05-04 | Discharge: 2022-05-04 | Disposition: A | Discharge status: Home / Self Care | Payer: BC Managed Care – PPO | Source: Ambulatory Visit | Attending: Internal Medicine | Admitting: Internal Medicine

## 2022-05-04 DIAGNOSIS — T8143XA Infection following a procedure, organ and space surgical site, initial encounter: Secondary | ICD-10-CM

## 2022-05-04 HISTORY — PX: IR RADIOLOGIST EVAL & MGMT: IMG5224

## 2022-05-04 LAB — LAB REPORT - SCANNED: EGFR: 100

## 2022-05-04 MED ORDER — IOPAMIDOL (ISOVUE-300) INJECTION 61%
100.0000 mL | Freq: Once | INTRAVENOUS | Status: AC | PRN
  Administered 2022-05-04: 100 mL via INTRAVENOUS

## 2022-05-04 NOTE — Progress Notes (Signed)
Referring Physician(s): Dr. Marcello Moores  Chief Complaint: The patient is seen in follow up today s/p presacral abscess drain via a right transgluteal approach. Placed on 6.6.23 by Dr. Serafina Royals  History of present illness:  71 y.o male outpatient. History significant for anxiety/depression, HTN, basil cell carcinoma (left calf) and hemorrhoids. He was recently hospitalized for abdominal pain, nausea and fever following outpatient transanal hemorrhoidal dearterialization on 4.21.23. And a canal proctoscopy with I&D 5.1.23. Found to have necrosis of anoderm posteriorly with large right gluteal abscess. Hospital course was complicated by a retroperitoneal infection s/p abdominal washout with drain placements x 2 and diverting colostomy creation. On 5.6.23/ Hospital course further complicated by numerous abscesses, fevers, and pain Evaluation for possible aspiration -drain placement on 5.14.23 snowed the fluid collections not amenable to percutaneous drainage.  On 5.16.23 IR aspirated a left posterior thigh fluid collection aspiration.  Microbiology resulted in candida albicans. On 5.29.23.  IR aspirated the LLQ fluid collection with 15 ml of purulent output. Microbiology resulted in e.coli.  On 6.6.23 IR placed a drain into a presacral abscess via right transgluteal approach with aspiration of approximately 30 mL of purulent fluid. Microbiology resulted in e.coli and enterococcus faecalis.   He is currently on IV antibiotic therapy. Imipenem-cilastatin. The antibiotics are scheduled to be completed on 6.26.23. He presents today for follow-up CT scan and drain evaluation.  He is currently flushing the drain once daily with 5 mL of saline. Output has been minimal, averaging about 5 mL/day.  He currently denies fever, headache, chest pain, dyspnea, cough, abdominal pain, nausea, vomiting or bleeding.    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), bilateral    Chronic pruritic rash in adult 2004   hx shingles  w/ residual recurrent rash, take valtrex daily   E coli infection 04/12/2022   ED (erectile dysfunction)    GERD (gastroesophageal reflux disease)    H/O pityriasis rosea    History of basal cell carcinoma (BCC) excision    2022 left calf s/p mohs   History of pertussis 2018   History of SCC (squamous cell carcinoma) of skin    removed from head   History of thrombocytopenia 12/03/2010   Hx of adenomatous polyp of colon 04/07/2017   Hypertension    Mild asthma    followed by pcp   OA (osteoarthritis)    knees, fingers   Prolapsed internal hemorrhoids, grade 3    hx  external hemorroid banding   Sepsis due to Enterococcus ([place]) 04/12/2022   Wears glasses     Past Surgical History:  Procedure Laterality Date   COLONOSCOPY WITH PROPOFOL  04/01/2017   by dr Carlean Purl   DIRECT LARYNGOSCOPY  1986   left vocal fold bx   (non-cancerous granuloma)   FINGER SURGERY Left 2017   ligament and tendon repair left index finger   HEMORRHOID SURGERY N/A 03/15/2022   Procedure: ANAL EXAM UNDER ANESTHESIA WITH PROCTOSCOPY  AND DEBRIDEMENT;  Surgeon: Leighton Ruff, MD;  Location: [facility];  Service: General;  Laterality: N/A;   INGUINAL HERNIA REPAIR Right 05/25/2018   Procedure: RIGHT INGUINAL HERNIA REPAIR WITH MESH;  Surgeon: Jovita Kussmaul, MD;  Location: [facility];  Service: General;  Laterality: Right;   INSERTION OF MESH Right 05/25/2018  Procedure: INSERTION OF MESH;  Surgeon: Jovita Kussmaul, MD;  Location: [facility];  Service: General;  Laterality: Right;   KNEE ARTHROSCOPY W/ MENISCAL REPAIR Right 10/26/2005   _0   by Dr  Wynelle Link   LAPAROSCOPIC APPENDECTOMY  03/22/2011   _1    LAPAROSCOPY N/A 03/20/2022   Procedure: DIAGNOSTIC  LAPAROSCOPY; ABDOMINAL [place] OUT AND DRAIN PLACEMENT; CREATION OF DIVERTING COLOSTOMY; ANAL EXAM UNDER ANESTHESIA;  Surgeon: Leighton Ruff, MD;  Location: [facility];  Service: General;  Laterality: N/A;   MOHS SURGERY  05/2021   left lower leg   TONSILLECTOMY  1958   TRANSANAL HEMORRHOIDAL DEARTERIALIZATION N/A 03/05/2022   Procedure: TRANSANAL HEMORRHOIDAL DEARTERIALIZATION;  Surgeon: Leighton Ruff, MD;  Location: [facility];  Service: General;  Laterality: N/A;    Allergies: Fluconazole, Griseofulvin, Sulfa antibiotics, Oxycodone, Mometasone furo-formoterol fum, and Retapamulin  Medications: Prior to Admission medications   Medication Sig Start Date End Date Taking? Authorizing Provider  acetaminophen (TYLENOL) 500 MG tablet Take 2 tablets (1,000 mg total) by mouth every 6 (six) hours as needed for mild pain. 0/93/26   Leighton Ruff, MD  albuterol (VENTOLIN HFA) 108 (90 Base) MCG/ACT inhaler Inhale 2 puffs into the lungs every 6 (six) hours as needed for wheezing or shortness of breath. 04/27/22   Angiulli, Lavon Paganini, PA-C  ascorbic acid (VITAMIN C) 500 MG tablet Take 1 tablet (500 mg total) by mouth 2 (two) times daily. 04/27/22   Angiulli, Lavon Paganini, PA-C  azelastine (ASTELIN) 0.1 % nasal spray Place 1 spray into both nostrils 2 (two) times daily as needed for rhinitis. Use in each nostril as directed    [provider]  bisoprolol (ZEBETA) 10 MG tablet Take 1 tablet (10 mg total) by mouth at bedtime. 04/27/22   Angiulli, Lavon Paganini, PA-C  caspofungin (CANCIDAS) IVPB Inject 50 mg into the vein daily for 13 days. Indication:  Intra-abdominal Infection First Dose: Yes Last Day of Therapy:  05/09/22 Labs - Once weekly:  CBC/D and BMP, Labs - Every other week:  ESR and CRP Method of administration: Elastomeric Method of administration may be changed at the discretion of home infusion pharmacist based upon assessment of the patient and/or caregiver's ability to self-administer  the medication ordered. 04/27/22 05/10/22  Angiulli, Lavon Paganini, PA-C  ferrous sulfate 325 (65 FE) MG tablet Take 1 tablet (325 mg total) by mouth 2 (two) times daily with a meal. 04/27/22   Angiulli, Lavon Paganini, PA-C  ibuprofen (ADVIL) 400 MG tablet Take 1-2 tablets (400-800 mg total) by mouth every 8 (eight) hours as needed for fever, mild pain or cramping. 05/26/44   Leighton Ruff, MD  imipenem-cilastatin (PRIMAXIN) IVPB Inject 750 mg into the vein every 8 (eight) hours for 13 days. Indication:  Intra-abdominal Infection First Dose: Yes Last Day of Therapy:  05/09/22 Labs - Once weekly:  CBC/D and BMP, Labs - Every other week:  ESR and CRP Method of administration: Mini-Bag Plus / Gravity Method of administration may be changed at the discretion of home infusion pharmacist based upon assessment of the patient and/or caregiver's ability to self-administer the medication ordered. 04/27/22 05/10/22  Angiulli, Lavon Paganini, PA-C  loperamide (IMODIUM) 2 MG capsule Take 1 capsule (2 mg total) by mouth with breakfast, with lunch, and with evening meal. 06/23/97   Leighton Ruff, MD  LORazepam (ATIVAN) 0.5 MG tablet Take 1-2 tablets (0.5-1 mg total) by mouth every 12 (twelve) hours as needed for anxiety, sedation or sleep. 04/27/22  Angiulli, Lavon Paganini, PA-C  melatonin 3 MG TABS tablet Take 1 tablet (3 mg total) by mouth at bedtime. 04/27/22   Angiulli, Lavon Paganini, PA-C  methocarbamol (ROBAXIN) 500 MG tablet Take 2 tablets (1,000 mg total) by mouth 3 (three) times daily. 04/27/22   Angiulli, Lavon Paganini, PA-C  micafungin in sodium chloride 0.9 % 100 mL 142m IV q24h 58/41/32  TLeighton Ruff MD  montelukast (SINGULAIR) 10 MG tablet Take 1 tablet (10 mg total) by mouth at bedtime. 04/27/22   Angiulli, DLavon Paganini PA-C  Multiple Vitamin (MULTIVITAMIN WITH MINERALS) TABS tablet Take 1 tablet by mouth daily. 54/40/10  TLeighton Ruff MD  esomeprazole (NEXIUM) 20 MG capsule Take 1 capsule (20 mg total) by mouth daily before breakfast.  04/27/22   Angiulli, DLavon Paganini PA-C  polycarbophil (FIBERCON) 625 MG tablet Take 2 tablets (1,250 mg total) by mouth 2 (two) times daily. 04/27/22   Angiulli, DLavon Paganini PA-C  traMADol (ULTRAM) 50 MG tablet Take 1-2 tablets (50-100 mg total) by mouth every 6 (six) hours as needed for moderate pain or severe pain. 04/27/22   Angiulli, DLavon Paganini PA-C     Family History  Problem Relation Age of Onset   AAA (abdominal aortic aneurysm) Mother    Glaucoma Sister    Stomach cancer Paternal Grandfather    Colon cancer Neg Hx     Social History   Socioeconomic History   Marital status: Single    Spouse name: Not on file   Number of children: Not on file   Years of education: Not on file   Highest education level: Not on file  Occupational History   Occupation: Professor    Employer: [employer]  Tobacco Use   Smoking status: Former    Years: 20.00    Types: Cigarettes    Quit date: 1990    Years since quitting: 33.4   Smokeless tobacco: Never  Vaping Use   Vaping Use: Never used  Substance and Sexual Activity   Alcohol use: Yes    Alcohol/week: 28.0 standard drinks of alcohol    Types: 28 Standard drinks or equivalent per week    Comment: 4 cocktails daily   Drug use: Never   Sexual activity: Yes    Partners: Male  Other Topics Concern   Not on file  Social History Narrative   Patient reports he is single 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  Physical Exam  Imaging: VAS [place]OWER EXTREMITY VENOUS (DVT)  Result Date: 05/01/2022  Lower Venous DVT Study Patient Name:  DTHEODORE VIRGIN Date of Exam:   04/30/2022 Medical Rec #: [id number]         Accession #:    [id number] of Birth: 304/11/1950         Patient Gender: M Patient Age:   89years [facility] Procedure:      VAS [place]OWER EXTREMITY VENOUS (DVT) Referring Phys: SColletta MarylandDIXON --------------------------------------------------------------------------------  Indications: Swelling, and  Edema. Other Indications: Sepsis left leg. Comparison Study: Prior venous duplex studies performed 03/24/2022 and                   03/25/2022 Performing Technologist: Delorise Shiner RVT  Examination Guidelines: A complete evaluation includes B-mode imaging, spectral Doppler, color Doppler, and power Doppler as needed of all accessible portions of each vessel. Bilateral testing is considered an integral part of a complete examination. Limited examinations for reoccurring indications may be performed as noted. The reflux portion of the exam is performed with the patient in reverse Trendelenburg.  +-----+---------------+---------+-----------+----------+--------------+ RIGHTCompressibilityPhasicitySpontaneityPropertiesThrombus Aging +-----+---------------+---------+-----------+----------+--------------+ CFV  Full           Yes      Yes                                 +-----+---------------+---------+-----------+----------+--------------+   +---------+---------------+---------+-----------+----------+--------------+ LEFT     CompressibilityPhasicitySpontaneityPropertiesThrombus Aging +---------+---------------+---------+-----------+----------+--------------+ CFV      Full           Yes      Yes                                 +---------+---------------+---------+-----------+----------+--------------+ SFJ      Full                                                        +---------+---------------+---------+-----------+----------+--------------+ FV Prox  Full           Yes      Yes                                  +---------+---------------+---------+-----------+----------+--------------+ FV Mid   Full           Yes      Yes                                 +---------+---------------+---------+-----------+----------+--------------+ FV DistalFull           Yes      Yes                                 +---------+---------------+---------+-----------+----------+--------------+ PFV      Full           Yes      Yes                                 +---------+---------------+---------+-----------+----------+--------------+ POP      Full           Yes      Yes                                 +---------+---------------+---------+-----------+----------+--------------+ GSV      Full                    Yes                                 +---------+---------------+---------+-----------+----------+--------------+  SSV      Full                                                        +---------+---------------+---------+-----------+----------+--------------+    Findings reported to Janene Madeira at 16:21.  Summary: RIGHT: - No evidence of common femoral vein obstruction.  LEFT: - There is no evidence of deep vein thrombosis in the lower extremity. - There is no evidence of superficial venous thrombosis.  - No cystic structure found in the popliteal fossa.  *See table(s) above for measurements and observations. Electronically signed by Jamelle Haring on 05/01/2022 at 12:30:01 PM.    Final     Labs:  CBC: Recent Labs    04/21/22 0458 04/24/22 0441 04/25/22 0420 04/28/22 0438  WBC 10.5 11.4* 10.2 9.3  HGB 7.6* 7.4* 7.8* 8.0*  HCT 24.1* 23.6* 23.7* 24.9*  PLT 336 378 366 403*    COAGS: Recent Labs    03/18/22 0617 04/12/22 0644 04/20/22 1521  INR 1.4* 1.2 1.2    BMP: Recent Labs    03/23/22 0742 03/23/22 0904 04/12/22 0644 04/13/22 0420 04/16/22 0434 04/23/22 0503 04/24/22 0441 04/27/22 1433  NA QUESTIONABLE RESULTS, RECOMMEND  RECOLLECT TO VERIFY   < > 132* 135  --   --  136 134*  K QUESTIONABLE RESULTS, RECOMMEND RECOLLECT TO VERIFY   < > 3.4* 4.0  --   --  4.1 3.8  CL QUESTIONABLE RESULTS, RECOMMEND RECOLLECT TO VERIFY   < > 99 103  --   --  99 101  CO2 QUESTIONABLE RESULTS, RECOMMEND RECOLLECT TO VERIFY   < > 25 26  --   --  26 25  GLUCOSE QUESTIONABLE RESULTS, RECOMMEND RECOLLECT TO VERIFY   < > 98 105*  --   --  96 113*  BUN QUESTIONABLE RESULTS, RECOMMEND RECOLLECT TO VERIFY   < > 8 10  --   --  17 20  CALCIUM QUESTIONABLE RESULTS, RECOMMEND RECOLLECT TO VERIFY   < > 8.2* 8.4*  --   --  9.1 9.0  CREATININE QUESTIONABLE RESULTS, RECOMMEND RECOLLECT TO VERIFY   < > 1.00 1.00 0.93 0.76 0.91 1.00  GFRNONAA QUESTIONABLE RESULTS, RECOMMEND RECOLLECT TO VERIFY   < > >60 >60 >60 >60 >60 >60  GFRAA QUESTIONABLE RESULTS, RECOMMEND RECOLLECT TO VERIFY  --   --   --   --   --   --   --    < > = values in this interval not displayed.    LIVER FUNCTION TESTS: Recent Labs    04/04/22 2333 04/08/22 0502 04/12/22 0644 04/13/22 0420  BILITOT 0.8 0.8 0.4 0.5  AST 51* _0 ALT 55* 33 28 36  ALKPHOS 436* 357* 309* 287*  PROT 6.3* 6.6 6.3* 6.3*  ALBUMIN 1.9* 2.0* 1.9* 1.9*    Assessment:  71 y.o male outpatient. History significant for anxiety/depression, HTN, basil cell carcinoma (left calf) and hemorrhoids. He was recently hospitalized for abdominal pain, nausea and fever following outpatient transanal hemorrhoidal dearterialization on 4.21.23. And a canal proctoscopy with I&D 5.1.23. Found to have necrosis of anoderm posteriorly with large right gluteal abscess. Hospital course was complicated by a retroperitoneal infection s/p abdominal washout with drain placements x 2 and diverting colostomy creation. On 5.6.23/ Hospital course further complicated by numerous  abscesses, fevers, and pain Evaluation for possible aspiration -drain placement on 5.14.23 snowed the fluid collections not amenable to percutaneous  drainage.  On 5.16.23 IR aspirated a left posterior thigh fluid collection aspiration. Microbiology resulted in candida albicans. On 5.29.23. On 5.29.23  IR aspirated the LLQ fluid collection with 15 ml of purulent output. Microbiology resulted in e.coli.  On 6.6.23 IR placed a drain into a presacral abscess via right transgluteal approach with aspiration of approximately 30 mL of purulent fluid. Microbiology resulted in e.coli and enterococcus faecalis.   He is currently on IV antibiotic therapy. Imipenem-cilastatin. The antibiotics are scheduled to be completed on 6.26.23. He presents today for follow-up CT scan and drain evaluation.  He is currently flushing the drain once daily with 5 mL of saline. Output has been minimal, averaging about 5 mL/day.  He currently denies fever, headache, chest pain, dyspnea, cough, abdominal pain, nausea, vomiting or bleeding.  The IR drain is currently to a JP suction device an clear yellow tinged fluid can be noted.  Suture and stat locks are in place and no erythema, edema, tenderness, bleeding or drainage noted at exit site. Dressing is clean dry and intact. Follow-up CT of abdomen pelvis done today reveals resolved presacral abscess with stable drain position.  Case was reviewed by Dr. Kathlene Cote and the decision made to remove  the right transgluteal approach presacral drain today.  The abdominal drain was removed in its entirety without immediate complications.  Gauze dressing applied to site.    Post-removal instructions: - Okay  to shower/sponge bath 24 hours post-removal. - No submerging (swimming, bathing) for 7 days post-removal. - Keep the dressing/bandage on to take shower, take dressing/bandage off and pat dry  the area completely before placing a new dressing/bandage  - Look for signs and symptoms of infection such as reddening of skin, pus like drainage,     fever and/or chills - Change dressing PRN until site fully healed.    Patient verbalized  understanding.    Signed: Jacqualine Mau, NP 05/04/2022, 1:05 PM   Please refer to Dr. Kathlene Cote attestation of this note for management and plan.

## 2022-05-06 ENCOUNTER — Telehealth: Payer: Self-pay

## 2022-05-06 NOTE — Telephone Encounter (Signed)
Patient called asking if he will need labs prior to appointment on 6/26. Per Stephanie Dixon, NP patient will need CBC, CRP, ESR, CMP.  Verbal orders given to Rebecca, RN with [facility] - Rebecca repeated orders back to me and verbalized her understanding.     P , CMA  

## 2022-05-10 ENCOUNTER — Ambulatory Visit (INDEPENDENT_AMBULATORY_CARE_PROVIDER_SITE_OTHER): Payer: BC Managed Care – PPO | Admitting: Infectious Diseases

## 2022-05-10 ENCOUNTER — Encounter: Payer: Self-pay | Admitting: Infectious Diseases

## 2022-05-10 ENCOUNTER — Telehealth: Payer: Self-pay

## 2022-05-10 ENCOUNTER — Other Ambulatory Visit: Payer: Self-pay

## 2022-05-10 VITALS — BP 120/78 | HR 93 | Temp 97.6°F | Resp 16 | Ht 69.0 in | Wt 159.0 lb

## 2022-05-10 DIAGNOSIS — B379 Candidiasis, unspecified: Secondary | ICD-10-CM

## 2022-05-10 DIAGNOSIS — Z792 Long term (current) use of antibiotics: Secondary | ICD-10-CM

## 2022-05-10 DIAGNOSIS — T8143XA Infection following a procedure, organ and space surgical site, initial encounter: Secondary | ICD-10-CM

## 2022-05-10 DIAGNOSIS — Z452 Encounter for adjustment and management of vascular access device: Secondary | ICD-10-CM

## 2022-05-10 MED ORDER — AMOXICILLIN-POT CLAVULANATE 875-125 MG PO TABS: 1.0000 | ORAL_TABLET | Freq: Two times a day (BID) | ORAL | 0 refills | Status: AC

## 2022-05-10 MED ORDER — CIPROFLOXACIN HCL 750 MG PO TABS: 750.0000 mg | ORAL_TABLET | Freq: Two times a day (BID) | ORAL | 0 refills | Status: AC

## 2022-05-11 ENCOUNTER — Telehealth: Payer: Self-pay

## 2022-05-11 DIAGNOSIS — Z452 Encounter for adjustment and management of vascular access device: Secondary | ICD-10-CM | POA: Insufficient documentation

## 2022-05-11 NOTE — Telephone Encounter (Signed)
Patient called office with question regarding antibiotics and potential drug interaction. States that he is on Nexium and takes it every morning. Patient read that he is not supposed to take Nexium with Amoxicillin.  Spoke with Marchelle Folks, Pharmacist who states okay for patient to take medication. Patient verbalized understanding. Juanita Laster, RMA

## 2022-05-11 NOTE — Assessment & Plan Note (Signed)
Polymicrobial intra-abdominal/pelvic, right gluteal and left thigh abscess = cx showing c.albicans, e.faecalis, and various e.coli isolates. For this infection he has had multiple surgeries to clean out, colostomy creation and various drains (surgically and percutaneously placed). He has recently completed imipenem and micafungin 6/25.   Reviewed course of treatment and all available records from hospital stay, CT scans and outpatient lab monitoring. He has 1 drain left in place (surgical) that is still putting out murky fluid, but recently < 50 cc daily. This drain will be in place another 3 weeks based on surgery team's assessment today. All large formed intraabdominal collections have resolved and percutaneous drains removed on 6.20 but still with some residual fluid described that is not measurable. I think we are at a good place to convert most of his treatment going forward to oral therapy. He has a sulfa allergy, tolerated cipro well in the past for unrelated infection.  Will stop imipenem and convert to combination oral treatment with Cipro 750 mg and Augmentin 875/125 mg BID for another 3 weeks.    He will call if he has any febrile reactions to augmentin (but seems he tolerated this well in the past and tolerated zosyn inpatient recently). Counseled re: avoiding dairy products with FQ, separate iron > 4h from doses as well to ensure fully absorbed.

## 2022-05-24 ENCOUNTER — Telehealth: Payer: Self-pay

## 2022-05-24 NOTE — Telephone Encounter (Signed)
Patient called requesting information on results from bloodwork done on 05/14/22 and 05/21/22.   Advised patient that results may not have been received yet for 7/7. Patient stated understanding.  Reviewed [facility] and it appears additional results are available from 6/29-6/30.  Patient requests a call back to review results. Confirmed best phone for callback is mobile number. Routed to [facility] clinical pharmacists.  Binnie Kand, RN

## 2022-05-24 NOTE — Telephone Encounter (Signed)
Relayed 6/29 and 7/7 results to patient over the phone. He was concerned about his WBC increasing; explained that it is a very minute increase and that his inflammatory markers have trended down which is good. He verbalized understanding and had no further questions. Tyler [name]

## 2022-05-25 ENCOUNTER — Encounter: Payer: Self-pay | Admitting: Internal Medicine

## 2022-05-27 ENCOUNTER — Encounter: Payer: Self-pay | Admitting: Internal Medicine

## 2022-05-31 ENCOUNTER — Ambulatory Visit: Payer: Self-pay | Admitting: General Surgery

## 2022-06-01 ENCOUNTER — Ambulatory Visit (INDEPENDENT_AMBULATORY_CARE_PROVIDER_SITE_OTHER): Payer: BC Managed Care – PPO | Admitting: Internal Medicine

## 2022-06-01 ENCOUNTER — Encounter: Payer: Self-pay | Admitting: Internal Medicine

## 2022-06-01 ENCOUNTER — Other Ambulatory Visit: Payer: Self-pay

## 2022-06-01 VITALS — BP 121/75 | HR 82 | Temp 98.0°F | Wt 157.0 lb

## 2022-06-01 DIAGNOSIS — T8143XA Infection following a procedure, organ and space surgical site, initial encounter: Secondary | ICD-10-CM

## 2022-06-01 DIAGNOSIS — L0231 Cutaneous abscess of buttock: Secondary | ICD-10-CM | POA: Diagnosis not present

## 2022-06-01 DIAGNOSIS — K651 Peritoneal abscess: Secondary | ICD-10-CM | POA: Diagnosis not present

## 2022-06-01 DIAGNOSIS — L03039 Cellulitis of unspecified toe: Secondary | ICD-10-CM

## 2022-06-01 DIAGNOSIS — L02419 Cutaneous abscess of limb, unspecified: Secondary | ICD-10-CM

## 2022-06-01 MED ORDER — AMOXICILLIN-POT CLAVULANATE 875-125 MG PO TABS
1.0000 | ORAL_TABLET | Freq: Two times a day (BID) | ORAL | 0 refills | Status: DC
Start: 2022-06-01 — End: 2022-06-29

## 2022-06-01 MED ORDER — CIPROFLOXACIN HCL 500 MG PO TABS: 500.0000 mg | ORAL_TABLET | Freq: Two times a day (BID) | ORAL | 0 refills | Status: DC

## 2022-06-01 NOTE — Progress Notes (Signed)
RFV :hospital follow up for IAA and thigh abscess  Patient ID: Tyler [name], male   DOB: 27-May-1951, 71 y.o.   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 He was found to have pelvic, and gluteal abscess as well as an abscess also extended down his ant compartment of left leg. He ended up undergoing a abdominal washout and diverting colostomy. He is status post drain placement. 1 pelvic drain remaining. He is still receiving IV and oral abtx. He is eating well and gaining weight. Today is last day of anidulafungin (hx of fluc allergy)  Pelvic abscess cx grew ecoli, efaecalis, b. Fragilis. candida  Outpatient Encounter Medications as of 06/01/2022  Medication Sig   acetaminophen (TYLENOL) 500 MG tablet Take 2 tablets (1,000 mg total) by mouth every 6 (six) hours as needed for mild pain.   albuterol (VENTOLIN HFA) 108 (90 Base) MCG/ACT inhaler Inhale 2 puffs into the lungs every 6 (six) hours as needed for wheezing or shortness of breath.   ascorbic acid (VITAMIN C) 500 MG tablet Take 1 tablet (500 mg total) by mouth 2 (two) times daily.   azelastine (ASTELIN) 0.1 % nasal spray Place 1 spray into both nostrils 2 (two) times daily as needed for rhinitis. Use in each nostril as directed   bisoprolol (ZEBETA) 10 MG tablet Take 1 tablet (10 mg total) by mouth at bedtime.   escitalopram (LEXAPRO) 10 MG tablet 0.5 tab daily for 1 week than increase to 1 tab daily   esomeprazole (NEXIUM) 20 MG capsule Take 1 capsule (20 mg total) by mouth daily before breakfast.   ferrous sulfate 325 (65 FE) MG tablet Take 1 tablet (325 mg total) by mouth 2 (two) times daily with a meal.   ibuprofen (ADVIL) 400 MG tablet Take 1-2 tablets (400-800 mg total) by mouth every 8 (eight) hours as needed for fever, mild pain or cramping.   loperamide (IMODIUM) 2 MG capsule Take 1 capsule (2 mg total) by mouth  with breakfast, with lunch, and with evening meal.   LORazepam (ATIVAN) 0.5 MG tablet Take 1-2 tablets (0.5-1 mg total) by mouth every 12 (twelve) hours as needed for anxiety, sedation or sleep.   melatonin 3 MG TABS tablet Take 1 tablet (3 mg total) by mouth at bedtime.   micafungin in sodium chloride 0.9 % 100 mL '100mg'$  IV q24h   montelukast (SINGULAIR) 10 MG tablet Take 1 tablet (10 mg total) by mouth at bedtime.   Multiple Vitamin (MULTIVITAMIN WITH MINERALS) TABS tablet Take 1 tablet by mouth daily.   polycarbophil (FIBERCON) 625 MG tablet Take 2 tablets (1,250 mg total) by mouth 2 (two) times daily.   tadalafil (CIALIS) 5 MG tablet TK 1 T PO QD PRN   traMADol (ULTRAM) 50 MG tablet Take 1-2 tablets (50-100 mg total) by mouth every 6 (six) hours as needed for moderate pain or severe pain.   valACYclovir (VALTREX) 500 MG tablet  (Patient not taking: Reported on 05/10/2022)   No facility-administered encounter medications on file as of 06/01/2022.     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 04/03/2022   Protein-calorie malnutrition, severe ([place]) 04/03/2022   Anxiety associated with depression 04/03/2022   Intra-abdominal abscess ([place]) 04/03/2022   Thigh abscess 04/03/2022   Normal anion gap metabolic acidosis 17/49/4496   Acute blood loss anemia (ABLA) 03/18/2022   Acute urinary retention 03/12/2022   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   Mild persistent asthma 03/12/2022   Abdominal pain 03/09/2022   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 03/09/2022   Pruritus ani 04/20/2018   Arthritis of both knees 01/13/2018   Hx of adenomatous polyp of colon 04/07/2017   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/12/2013   History of tobacco use 11/12/2013   Low testosterone 09/19/2013   Sinusitis 12/21/2012   Herpes labialis 10/30/2012   Eczema 10/30/2012   Prolapsed internal hemorrhoids, grade 3, THD ligation 03/07/2022 10/30/2012   Hypertension 04/13/2011   FASCIITIS, PLANTAR 05/14/2010   NEOPLASM, SKIN, UNCERTAIN BEHAVIOR 75/91/6384   Chronic rhinitis 08/16/2008   Rash and other nonspecific skin eruption 07/02/2008   ABNORMAL COAGULATION PROFILE 05/29/2008   Gastroesophageal reflux disease 06/28/2007   PITYRIASIS ROSEA 06/28/2007   Osteoarthritis of knee 06/28/2007   Asthmatic bronchitis 05/11/2007     Health Maintenance Due  Topic Date Due   Hepatitis C Screening  Never done   Zoster Vaccines- Shingrix (1 of 2) Never done   Pneumonia Vaccine 19+ Years old (1 - PCV) Never done     Review of Systems Still some slight abdominal discomfort at remaining drain but no fever chills nightsweats. Physical Exam   BP 121/75   Pulse 82   Temp 98 F (36.7 C) (Oral)   Wt 157 lb (71.2 kg)   BMI 23.18 kg/m    Physical Exam  Constitutional: He is oriented to person, place, and time. He appears well-developed and well-nourished. No distress.  HENT:  Mouth/Throat: Oropharynx is clear and moist. No oropharyngeal exudate.  Cardiovascular: Normal rate, regular rhythm and normal heart sounds. Exam reveals no gallop and no friction rub.  No murmur heard.  Pulmonary/Chest: Effort normal and breath sounds normal. No respiratory distress. He has no wheezes.  Abdominal: Soft. Bowel sounds are normal. One drain remaining in RLQ. Serous fluid in collection system. Neurological: He is alert and oriented to  person, place, and time.  Skin: Skin is warm and dry. No rash noted. No erythema.  Psychiatric: He has a normal mood and affect. His behavior is normal.    CBC Lab Results  Component Value Date   WBC 9.3 04/28/2022   RBC 2.69 (L) 04/28/2022   HGB 8.0 (L) 04/28/2022   HCT 24.9 (L) 04/28/2022   PLT 403 (H) 04/28/2022   MCV 92.6 04/28/2022   MCH 29.7 04/28/2022   MCHC 32.1 04/28/2022   RDW 15.3 04/28/2022   LYMPHSABS 3.6 04/28/2022   MONOABS 0.7 04/28/2022   EOSABS 0.2 04/28/2022    BMET Lab Results  Component Value Date   NA 134 (L) 04/27/2022   K 3.8 04/27/2022   CL 101 04/27/2022   CO2 25 04/27/2022   GLUCOSE 113 (H) 04/27/2022   BUN 20 04/27/2022   CREATININE 1.00 04/27/2022   CALCIUM 9.0 04/27/2022   GFRNONAA >60 04/27/2022   GFRAA  03/23/2022  QUESTIONABLE RESULTS, RECOMMEND RECOLLECT TO VERIFY     Lab Results  Component Value Date   ESRSEDRATE 28 (H) 06/01/2022   Lab Results  Component Value Date   CRP 43.5 (H) 06/01/2022   Lab Results  Component Value Date   CREATININE 0.91 06/01/2022    Assessment and Plan  Will do 2 addn weeks of oral cipro and amox/clav. Appears to tolerate. Will pull picc. Does not need caspofungin any longer. Have [facility] to pull picc line  Continue to flush drain per dr Marcello Moores' instructions. Still slight murky fluid  Will do repeat femur CT to see that fluid collections/abscess has resolved. Reassess in 2 wk to see if oral abtx needs to continue  Will check bmp, sed rate and crp today

## 2022-06-01 NOTE — Progress Notes (Signed)
Per verbal order from _Dr. Snider______, _39__cm PICC removed from _Right upper arm____, tip intact. No sutures present. RN confirmed length per chart. Dressing was clean and dry. Insertion site cleaned with CHG. Petroleum dressing applied. Patient advised no heavy lifting with this arm and to leave dressing in place for 24 hours and not to shower affected arm for 24 hours. Advised patient to seek emergency medical care if dressing becomes soaked with blood, swelling, or sharp pain presents. Advised patient to seek emergent care if develops neurological symptoms, chest pain, or shortness of breath. Instructed patient to notify office if they notice redness, warmth, or drainage at the site. Patient verbalized understanding and agreement. RN answered patient's questions. Patient tolerated procedure well and RN walked patient to check out.

## 2022-06-02 ENCOUNTER — Telehealth: Payer: Self-pay

## 2022-06-02 NOTE — Telephone Encounter (Signed)
CT L Lower Ext Femur DX Abcess Great Toenail-  [facility] 06/10/22   Authorized after Dr Baxter Flattery did Peer to Peer Auth [id number] - valid from 7/19-8/17/23.

## 2022-06-03 LAB — BASIC METABOLIC PANEL
BUN: 23 mg/dL (ref 7–25)
CO2: 23 mmol/L (ref 20–32)
Calcium: 9 mg/dL (ref 8.6–10.3)
Chloride: 106 mmol/L (ref 98–110)
Creat: 0.91 mg/dL (ref 0.70–1.35)
Glucose, Bld: 88 mg/dL (ref 65–99)
Potassium: 3.9 mmol/L (ref 3.5–5.3)
Sodium: 139 mmol/L (ref 135–146)

## 2022-06-03 LAB — C-REACTIVE PROTEIN: CRP: 43.5 mg/L — ABNORMAL HIGH (ref <8.0)

## 2022-06-03 LAB — SEDIMENTATION RATE: Sed Rate: 28 mm/h — ABNORMAL HIGH (ref 0–20)

## 2022-06-07 ENCOUNTER — Telehealth: Payer: Self-pay

## 2022-06-07 NOTE — Telephone Encounter (Signed)
Spoke with patient and let him know that Dr. Baxter Flattery received his earlier message, and stated she would check in with him to see if he has a persistent fever.  Patient stated that fever started again this afternoon, and is now 101.58F. Denies other symptoms, except fatigue.   Provider notified.  Binnie Kand, RN

## 2022-06-07 NOTE — Telephone Encounter (Signed)
Patient called wanting to update Dr. Baxter Flattery on additional symptoms. States he experienced a low-grade fever of 99.48F that began yesterday afternoon. No fever at this time. Endorsed night sweats last night. States he has also communicated symptoms to his Psychologist, sport and exercise.  Routed to provider. Patient confirmed his follow up appointment with Dr. Baxter Flattery on 7/27.  Tyler Kand, RN

## 2022-06-08 ENCOUNTER — Other Ambulatory Visit: Payer: Self-pay | Admitting: Internal Medicine

## 2022-06-08 DIAGNOSIS — T8143XA Infection following a procedure, organ and space surgical site, initial encounter: Secondary | ICD-10-CM

## 2022-06-08 NOTE — Progress Notes (Signed)
Having fevers. Will also get abd ct with con  on 7/27 when he does thigh ct

## 2022-06-09 ENCOUNTER — Telehealth: Payer: Self-pay

## 2022-06-09 NOTE — Telephone Encounter (Signed)
Patient called to make Dr. Baxter Flattery aware that he he continue to feel very tired. Covid home test was negative, but fevers continue. Was 103 today with a digital thermometer. Patient confirmed appointment with Dr. Baxter Flattery. Advised to stay hydrated if he starts to feel worse than seek emergency care. Eugenia Mcalpine

## 2022-06-10 ENCOUNTER — Other Ambulatory Visit: Payer: Self-pay

## 2022-06-10 ENCOUNTER — Encounter: Payer: Self-pay | Admitting: Internal Medicine

## 2022-06-10 ENCOUNTER — Ambulatory Visit (INDEPENDENT_AMBULATORY_CARE_PROVIDER_SITE_OTHER): Payer: BC Managed Care – PPO | Admitting: Internal Medicine

## 2022-06-10 ENCOUNTER — Ambulatory Visit (HOSPITAL_COMMUNITY)
Admission: RE | Admit: 2022-06-10 | Discharge: 2022-06-10 | Disposition: A | Discharge status: Home / Self Care | Payer: BC Managed Care – PPO | Source: Ambulatory Visit | Attending: Internal Medicine | Admitting: Internal Medicine

## 2022-06-10 VITALS — BP 128/77 | HR 86 | Temp 98.3°F | Wt 166.0 lb

## 2022-06-10 DIAGNOSIS — R197 Diarrhea, unspecified: Secondary | ICD-10-CM | POA: Diagnosis not present

## 2022-06-10 DIAGNOSIS — T8143XA Infection following a procedure, organ and space surgical site, initial encounter: Secondary | ICD-10-CM | POA: Diagnosis present

## 2022-06-10 DIAGNOSIS — L03039 Cellulitis of unspecified toe: Secondary | ICD-10-CM | POA: Diagnosis present

## 2022-06-10 DIAGNOSIS — R509 Fever, unspecified: Secondary | ICD-10-CM | POA: Diagnosis not present

## 2022-06-10 MED ORDER — SODIUM CHLORIDE (PF) 0.9 % IJ SOLN
INTRAMUSCULAR | Status: AC
  Filled 2022-06-10: qty 50

## 2022-06-10 MED ORDER — IOHEXOL 9 MG/ML PO SOLN
500.0000 mL | ORAL | Status: AC
  Administered 2022-06-10 (×2): 500 mL via ORAL

## 2022-06-10 MED ORDER — IOHEXOL 300 MG/ML  SOLN
100.0000 mL | Freq: Once | INTRAMUSCULAR | Status: AC | PRN
  Administered 2022-06-10: 100 mL via INTRAVENOUS

## 2022-06-10 NOTE — Progress Notes (Signed)
Patient ID: Tyler [name], male   DOB: 06-06-1951, 71 y.o.   MRN: [id number]  HPI  Tyler [name] is a 43yoM being treated for IA and thigh abscess with amox/clav and cipro, nearing completion of treatment. Had repeat CT imaging today which is reassuring. However, he has had periodic new onset fever, up to 100-104F in the last 2 days. Sleep in the daytime & not so much in evening  Sunday went to church and sang in choir. Small church. 35 people, hugging people? Has had 2 covid antigen test which were negative. Some nasal congestion but not new;No muscle aches  Some irritation to right bulb when he bends over.  Increasing diarrhea, watery yesterday, in the last 3 days. Possibly belly pain  Outpatient Encounter Medications as of 06/10/2022  Medication Sig   acetaminophen (TYLENOL) 500 MG tablet Take 2 tablets (1,000 mg total) by mouth every 6 (six) hours as needed for mild pain.   albuterol (VENTOLIN HFA) 108 (90 Base) MCG/ACT inhaler Inhale 2 puffs into the lungs every 6 (six) hours as needed for wheezing or shortness of breath.   amoxicillin-clavulanate (AUGMENTIN) 875-125 MG tablet Take 1 tablet by mouth 2 (two) times daily.   ascorbic acid (VITAMIN C) 500 MG tablet Take 1 tablet (500 mg total) by mouth 2 (two) times daily.   azelastine (ASTELIN) 0.1 % nasal spray Place 1 spray into both nostrils 2 (two) times daily as needed for rhinitis. Use in each nostril as directed   bisoprolol (ZEBETA) 10 MG tablet Take 1 tablet (10 mg total) by mouth at bedtime.   ciprofloxacin (CIPRO) 500 MG tablet Take 1 tablet (500 mg total) by mouth 2 (two) times daily.   escitalopram (LEXAPRO) 10 MG tablet 0.5 tab daily for 1 week than increase to 1 tab daily   esomeprazole (NEXIUM) 20 MG capsule Take 1 capsule (20 mg total) by mouth daily before breakfast.   ferrous sulfate 325 (65 FE) MG tablet Take 1 tablet (325 mg total) by mouth 2 (two) times daily with a meal.   ibuprofen (ADVIL) 400 MG tablet Take 1-2  tablets (400-800 mg total) by mouth every 8 (eight) hours as needed for fever, mild pain or cramping.   loperamide (IMODIUM) 2 MG capsule Take 1 capsule (2 mg total) by mouth with breakfast, with lunch, and with evening meal.   LORazepam (ATIVAN) 0.5 MG tablet Take 1-2 tablets (0.5-1 mg total) by mouth every 12 (twelve) hours as needed for anxiety, sedation or sleep.   melatonin 3 MG TABS tablet Take 1 tablet (3 mg total) by mouth at bedtime.   montelukast (SINGULAIR) 10 MG tablet Take 1 tablet (10 mg total) by mouth at bedtime.   Multiple Vitamin (MULTIVITAMIN WITH MINERALS) TABS tablet Take 1 tablet by mouth daily.   tadalafil (CIALIS) 5 MG tablet TK 1 T PO QD PRN   traMADol (ULTRAM) 50 MG tablet Take 1-2 tablets (50-100 mg total) by mouth every 6 (six) hours as needed for moderate pain or severe pain.   micafungin in sodium chloride 0.9 % 100 mL '100mg'$  IV q24h   polycarbophil (FIBERCON) 625 MG tablet Take 2 tablets (1,250 mg total) by mouth 2 (two) times daily.   valACYclovir (VALTREX) 500 MG tablet  (Patient not taking: Reported on 05/10/2022)   Facility-Administered Encounter Medications as of 06/10/2022  Medication   sodium chloride (PF) 0.9 % injection   sodium chloride (PF) 0.9 % injection     Patient Active Problem List  Diagnosis Date Noted   PICC (peripherally inserted central catheter) in place 05/11/2022   Long term (current) use of antibiotics 05/10/2022   Candida albicans infection 04/19/2022   Leukocytosis    Pain in joint involving pelvic region and thigh    Abscess, intra-abdominal, postoperative 04/12/2022   Sepsis due to Enterococcus ([place]) 04/12/2022   E coli infection 04/12/2022   Debility 04/09/2022   Hypokalemia 04/04/2022   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 04/03/2022   Protein-calorie malnutrition, severe ([place]) 04/03/2022   Anxiety associated with depression 04/03/2022   Intra-abdominal abscess ([place]) 04/03/2022   Thigh abscess 04/03/2022   Normal  anion gap metabolic acidosis 11/57/2620   Acute blood loss anemia (ABLA) 03/18/2022   Acute urinary retention 03/12/2022   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   Mild persistent asthma 03/12/2022   Abdominal pain 03/09/2022   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/09/2022   Pruritus ani 04/20/2018   Arthritis of both knees 01/13/2018   Hx of adenomatous polyp of colon 04/07/2017   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/12/2013   History of tobacco use 11/12/2013   Low testosterone 09/19/2013   Sinusitis 12/21/2012   Herpes labialis 10/30/2012   Eczema 10/30/2012   Prolapsed internal hemorrhoids, grade 3, THD ligation 03/07/2022 10/30/2012   Hypertension 04/13/2011   FASCIITIS, PLANTAR 05/14/2010   NEOPLASM, SKIN, UNCERTAIN BEHAVIOR 35/59/7416   Chronic rhinitis 08/16/2008   Rash and other nonspecific skin eruption 07/02/2008   ABNORMAL COAGULATION PROFILE 05/29/2008   Gastroesophageal reflux disease 06/28/2007   PITYRIASIS ROSEA 06/28/2007   Osteoarthritis of knee 06/28/2007   Asthmatic bronchitis 05/11/2007     Health Maintenance Due  Topic Date Due   Hepatitis C Screening  Never done   Zoster Vaccines- Shingrix (1 of 2) Never done   Pneumonia Vaccine 44+ Years old (1 - PCV) Never done     Review of Systems 12 point ros is negative except what is mentioned above Physical Exam   BP 128/77   Pulse 86   Temp 98.3 F (36.8 C) (Oral)   Wt 166 lb (75.3 kg)   BMI 24.51 kg/m   Physical Exam  Constitutional: He is oriented to person, place, and time. He appears well-developed and well-nourished. No distress.  HENT:  Mouth/Throat: Oropharynx is clear and moist. No oropharyngeal exudate.  Cardiovascular: Normal rate, regular rhythm and normal heart sounds. Exam reveals no gallop and no friction rub.  No murmur heard.  Pulmonary/Chest: Effort normal  and breath sounds normal. No respiratory distress. He has no wheezes.  Abdominal: Soft. Bowel sounds are normal. RLQ drain serous fluid. LLQ ostomy soft watery stool He has no cervical adenopathy.  Neurological: He is alert and oriented to person, place, and time.  Skin: Skin is warm and dry. No rash noted. No erythema.  Psychiatric: He has a normal mood and affect. His behavior is normal.    CBC Lab Results  Component Value Date   WBC 9.3 04/28/2022   RBC 2.69 (L) 04/28/2022   HGB 8.0 (L) 04/28/2022   HCT 24.9 (L) 04/28/2022   PLT 403 (H) 04/28/2022   MCV 92.6 04/28/2022   MCH 29.7 04/28/2022   MCHC 32.1 04/28/2022   RDW 15.3 04/28/2022   LYMPHSABS 3.6 04/28/2022   MONOABS 0.7 04/28/2022   EOSABS 0.2 04/28/2022    BMET Lab Results  Component Value Date  NA 139 06/01/2022   K 3.9 06/01/2022   CL 106 06/01/2022   CO2 23 06/01/2022   GLUCOSE 88 06/01/2022   BUN 23 06/01/2022   CREATININE 0.91 06/01/2022   CALCIUM 9.0 06/01/2022   GFRNONAA >60 04/27/2022   GFRAA  03/23/2022    QUESTIONABLE RESULTS, RECOMMEND RECOLLECT TO VERIFY      Assessment and plan: FUO = will do fever work up to include cdiff testing Will rule out for covid, check for cdiff, and cbc with bmp  Intra-abdominal abscess and thigh abscess=Can stop abtx -since  scan look better Also communicate to dr Marcello Moores - scan complete and defer to when drain is to be pulled.

## 2022-06-14 ENCOUNTER — Telehealth: Payer: Self-pay

## 2022-06-14 LAB — SARS-COV-2 RNA,(COVID-19) QUALITATIVE NAAT: SARS CoV2 RNA: NOT DETECTED

## 2022-06-14 LAB — C. DIFFICILE GDH AND TOXIN A/B
GDH ANTIGEN: DETECTED
MICRO NUMBER:: 13707593
SPECIMEN QUALITY:: ADEQUATE
TOXIN A AND B: NOT DETECTED

## 2022-06-14 LAB — CLOSTRIDIUM DIFFICILE TOXIN B, QUALITATIVE, REAL-TIME PCR: Toxigenic C. Difficile by PCR: NOT DETECTED

## 2022-06-14 NOTE — Telephone Encounter (Signed)
Patient has been having fever for 1 week (100.2 last night and before that 104) NO Fever today.Has watery stool (4 times at night and 2 during day) denies nausea and vomiting. Was instructed Thursday to STOP Antibiotics. Has night sweats that now are happening during the day. Just wants to report the loose stools and sweats as he is concerned why still has this after stopped abx. He also wanted to know why he has not gotten blood culture results. I advised they take time to grow. Please advise.

## 2022-06-16 ENCOUNTER — Other Ambulatory Visit: Payer: Self-pay

## 2022-06-16 ENCOUNTER — Ambulatory Visit (INDEPENDENT_AMBULATORY_CARE_PROVIDER_SITE_OTHER): Payer: BC Managed Care – PPO | Admitting: Internal Medicine

## 2022-06-16 VITALS — BP 112/73 | HR 74 | Temp 97.8°F

## 2022-06-16 DIAGNOSIS — T8143XA Infection following a procedure, organ and space surgical site, initial encounter: Secondary | ICD-10-CM

## 2022-06-16 DIAGNOSIS — R509 Fever, unspecified: Secondary | ICD-10-CM | POA: Diagnosis not present

## 2022-06-16 DIAGNOSIS — R197 Diarrhea, unspecified: Secondary | ICD-10-CM | POA: Diagnosis not present

## 2022-06-16 LAB — COMPREHENSIVE METABOLIC PANEL
AG Ratio: 1.3 (calc) (ref 1.0–2.5)
ALT: 44 U/L (ref 9–46)
AST: 54 U/L — ABNORMAL HIGH (ref 10–35)
Albumin: 3.8 g/dL (ref 3.6–5.1)
Alkaline phosphatase (APISO): 194 U/L — ABNORMAL HIGH (ref 35–144)
BUN: 17 mg/dL (ref 7–25)
CO2: 25 mmol/L (ref 20–32)
Calcium: 9.2 mg/dL (ref 8.6–10.3)
Chloride: 101 mmol/L (ref 98–110)
Creat: 0.87 mg/dL (ref 0.70–1.35)
Globulin: 3 g/dL (calc) (ref 1.9–3.7)
Glucose, Bld: 109 mg/dL — ABNORMAL HIGH (ref 65–99)
Potassium: 3.6 mmol/L (ref 3.5–5.3)
Sodium: 135 mmol/L (ref 135–146)
Total Bilirubin: 0.5 mg/dL (ref 0.2–1.2)
Total Protein: 6.8 g/dL (ref 6.1–8.1)

## 2022-06-16 LAB — CULTURE, BLOOD (SINGLE)
MICRO NUMBER:: 13704013
MICRO NUMBER:: 13704014
Result:: NO GROWTH
Result:: NO GROWTH
SPECIMEN QUALITY:: ADEQUATE
SPECIMEN QUALITY:: ADEQUATE

## 2022-06-16 LAB — CBC WITH DIFFERENTIAL/PLATELET
Absolute Monocytes: 751 cells/uL (ref 200–950)
Basophils Absolute: 38 cells/uL (ref 0–200)
Basophils Relative: 0.4 %
Eosinophils Absolute: 57 cells/uL (ref 15–500)
Eosinophils Relative: 0.6 %
HCT: 32.4 % — ABNORMAL LOW (ref 38.5–50.0)
Hemoglobin: 10.6 g/dL — ABNORMAL LOW (ref 13.2–17.1)
Lymphs Abs: 2290 cells/uL (ref 850–3900)
MCH: 29.4 pg (ref 27.0–33.0)
MCHC: 32.7 g/dL (ref 32.0–36.0)
MCV: 90 fL (ref 80.0–100.0)
MPV: 10.1 fL (ref 7.5–12.5)
Monocytes Relative: 7.9 %
Neutro Abs: 6365 cells/uL (ref 1500–7800)
Neutrophils Relative %: 67 %
Platelets: 285 10*3/uL (ref 140–400)
RBC: 3.6 10*6/uL — ABNORMAL LOW (ref 4.20–5.80)
RDW: 14.9 % (ref 11.0–15.0)
Total Lymphocyte: 24.1 %
WBC: 9.5 10*3/uL (ref 3.8–10.8)

## 2022-06-16 LAB — MAGNESIUM: Magnesium: 1.8 mg/dL (ref 1.5–2.5)

## 2022-06-16 MED ORDER — CIPROFLOXACIN HCL 500 MG PO TABS
500.0000 mg | ORAL_TABLET | Freq: Two times a day (BID) | ORAL | 0 refills | Status: DC
Start: 2022-06-16 — End: 2022-07-26

## 2022-06-16 MED ORDER — AMOXICILLIN-POT CLAVULANATE 875-125 MG PO TABS
1.0000 | ORAL_TABLET | Freq: Two times a day (BID) | ORAL | 1 refills | Status: DC
Start: 2022-06-16 — End: 2022-07-26

## 2022-06-16 NOTE — Progress Notes (Signed)
RFV: sick visit concern for intra-abdominal infection  Patient ID: Tyler [name], male   DOB: 07/20/1951, 71 y.o.   MRN: [id number]  HPI Still having fevers up to 101.6F. suture broke and his bulb was loose. He was reattached yesterday and filling up more. Had 72m drain yesterday. And 180movernight. And 3587m- milky brown fluid  Outpatient Encounter Medications as of 06/16/2022  Medication Sig   acetaminophen (TYLENOL) 500 MG tablet Take 2 tablets (1,000 mg total) by mouth every 6 (six) hours as needed for mild pain.   albuterol (VENTOLIN HFA) 108 (90 Base) MCG/ACT inhaler Inhale 2 puffs into the lungs every 6 (six) hours as needed for wheezing or shortness of breath.   amoxicillin-clavulanate (AUGMENTIN) 875-125 MG tablet Take 1 tablet by mouth 2 (two) times daily.   ascorbic acid (VITAMIN C) 500 MG tablet Take 1 tablet (500 mg total) by mouth 2 (two) times daily.   azelastine (ASTELIN) 0.1 % nasal spray Place 1 spray into both nostrils 2 (two) times daily as needed for rhinitis. Use in each nostril as directed   bisoprolol (ZEBETA) 10 MG tablet Take 1 tablet (10 mg total) by mouth at bedtime.   ciprofloxacin (CIPRO) 500 MG tablet Take 1 tablet (500 mg total) by mouth 2 (two) times daily.   escitalopram (LEXAPRO) 10 MG tablet 0.5 tab daily for 1 week than increase to 1 tab daily   esomeprazole (NEXIUM) 20 MG capsule Take 1 capsule (20 mg total) by mouth daily before breakfast.   ferrous sulfate 325 (65 FE) MG tablet Take 1 tablet (325 mg total) by mouth 2 (two) times daily with a meal.   ibuprofen (ADVIL) 400 MG tablet Take 1-2 tablets (400-800 mg total) by mouth every 8 (eight) hours as needed for fever, mild pain or cramping.   loperamide (IMODIUM) 2 MG capsule Take 1 capsule (2 mg total) by mouth with breakfast, with lunch, and with evening meal.   LORazepam (ATIVAN) 0.5 MG tablet Take 1-2 tablets (0.5-1 mg total) by mouth every 12 (twelve) hours as needed for anxiety, sedation or  sleep.   melatonin 3 MG TABS tablet Take 1 tablet (3 mg total) by mouth at bedtime.   micafungin in sodium chloride 0.9 % 100 mL '100mg'$  IV q24h   montelukast (SINGULAIR) 10 MG tablet Take 1 tablet (10 mg total) by mouth at bedtime.   Multiple Vitamin (MULTIVITAMIN WITH MINERALS) TABS tablet Take 1 tablet by mouth daily.   polycarbophil (FIBERCON) 625 MG tablet Take 2 tablets (1,250 mg total) by mouth 2 (two) times daily.   tadalafil (CIALIS) 5 MG tablet TK 1 T PO QD PRN   traMADol (ULTRAM) 50 MG tablet Take 1-2 tablets (50-100 mg total) by mouth every 6 (six) hours as needed for moderate pain or severe pain.   valACYclovir (VALTREX) 500 MG tablet  (Patient not taking: Reported on 05/10/2022)   No facility-administered encounter medications on file as of 06/16/2022.     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 04/03/2022  Protein-calorie malnutrition, severe ([place]) 04/03/2022   Anxiety associated with depression 04/03/2022   Intra-abdominal abscess ([place]) 04/03/2022   Thigh abscess 04/03/2022   Normal anion gap metabolic acidosis 17/40/8144   Acute blood loss anemia (ABLA) 03/18/2022   Acute urinary retention 03/12/2022   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   Mild persistent asthma 03/12/2022   Abdominal pain 03/09/2022   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/09/2022   Pruritus ani 04/20/2018   Arthritis of both knees 01/13/2018   Hx of adenomatous polyp of colon 04/07/2017   Hyperacusis  of left ear 09/17/2016   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/12/2013   History of tobacco use 11/12/2013   Low testosterone 09/19/2013   Sinusitis 12/21/2012   Herpes labialis 10/30/2012   Eczema 10/30/2012   Prolapsed internal hemorrhoids, grade 3, THD ligation 03/07/2022 10/30/2012   Hypertension 04/13/2011   FASCIITIS, PLANTAR 05/14/2010   NEOPLASM, SKIN, UNCERTAIN BEHAVIOR 81/85/6314   Chronic rhinitis 08/16/2008   Rash and other nonspecific skin eruption 07/02/2008   ABNORMAL COAGULATION PROFILE 05/29/2008   Gastroesophageal reflux disease 06/28/2007   PITYRIASIS ROSEA 06/28/2007   Osteoarthritis of knee 06/28/2007   Asthmatic bronchitis 05/11/2007     Health Maintenance Due  Topic Date Due   Hepatitis C Screening  Never done   Zoster Vaccines- Shingrix (1 of 2) Never done   Pneumonia Vaccine 77+ Years old (1 - PCV) Never done   INFLUENZA VACCINE  06/15/2022     Review of Systems 12 point ros is negative except what is mentioned in hpi Physical Exam   There were no vitals taken for this visit.  Physical Exam  Constitutional: He is oriented to person, place, and time. He appears well-developed and well-nourished. No distress.  HENT:  Mouth/Throat: Oropharynx is clear and moist. No oropharyngeal exudate.  Cardiovascular: Normal rate, regular rhythm and normal heart sounds. Exam reveals no gallop and no friction rub.  No murmur heard.  Pulmonary/Chest: Effort normal and breath sounds normal. No respiratory distress. He has no wheezes.  Abdominal: Soft. Bowel sounds are normal. He exhibits no distension. There is no tenderness. RLQ abdominal drain with purulentfluid Lymphadenopathy:  He has no cervical adenopathy.  Neurological: He is alert and oriented to person, place, and time.  Skin: Skin is warm and dry. No rash noted. No erythema.  Psychiatric: He has a normal mood and affect. His behavior is normal.    CBC Lab  Results  Component Value Date   WBC 9.5 06/10/2022   RBC 3.60 (L) 06/10/2022   HGB 10.6 (L) 06/10/2022   HCT 32.4 (L) 06/10/2022   PLT 285 06/10/2022   MCV 90.0 06/10/2022   MCH 29.4 06/10/2022   MCHC 32.7 06/10/2022   RDW 14.9 06/10/2022   LYMPHSABS 2,290 06/10/2022   MONOABS 0.7 04/28/2022   EOSABS 57 06/10/2022    BMET Lab Results  Component Value Date   NA 135 06/10/2022   K 3.6 06/10/2022   CL 101 06/10/2022   CO2 25 06/10/2022   GLUCOSE 109 (H) 06/10/2022   BUN 17 06/10/2022   CREATININE 0.87 06/10/2022   CALCIUM 9.2 06/10/2022   GFRNONAA >60 04/27/2022   GFRAA  03/23/2022    QUESTIONABLE RESULTS, RECOMMEND RECOLLECT TO VERIFY      Assessment and Plan  Pelvic abscess = maybe the cause of fevers since we stopped his abtx recently- restart abtx, plan for 2-3 wk. Will repeat cipro  and amox/clav and keep diarrhea, check in Friday by phone.   - nontoxigenic cdifficile colonization = no need to treat  Cast in urine = possibly due to aki. Will defer to pcp

## 2022-06-23 ENCOUNTER — Telehealth: Payer: Self-pay

## 2022-06-23 NOTE — Telephone Encounter (Signed)
Patient called stating that he hasn't heard from Dr.Snider - wanted to know if Dr.Snider wanted to do a video chat or call him sometime this week? Also his surgeon Dr.Thomas told him that all blood work could be left up to you - patient wants to know if he needs a CBC done.    [place], CMA

## 2022-06-25 ENCOUNTER — Telehealth: Payer: Self-pay | Admitting: Internal Medicine

## 2022-06-25 NOTE — Telephone Encounter (Signed)
Doing better with oral abtx. Not having fevers anymore.The output of the drain sometimes milky but now decreased output to 53m per day. Has sigmoid eval on 8/24th. We will see him in clinic 4 days after procedure. The plan would be to continue on cipro and amox/clav til we see him.

## 2022-06-29 ENCOUNTER — Encounter (HOSPITAL_BASED_OUTPATIENT_CLINIC_OR_DEPARTMENT_OTHER): Payer: Self-pay | Admitting: General Surgery

## 2022-06-29 NOTE — Progress Notes (Signed)
Spoke w/ via phone for pre-op interview--- pt Lab needs dos----    [employer] results------ current ekg in epic/ chart COVID test -----patient states asymptomatic no test needed Arrive at ------- 0630 on 07-08-2022 NPO after MN NO Solid Food.  Clear liquids from MN until--- 0530 Med rec completed Medications to take morning of surgery ----- norvasc, nexium, lexapro, valtrex Diabetic medication ----- n/a Patient instructed no nail polish to be worn day of surgery Patient instructed to bring photo id and insurance card day of surgery Patient aware to have Driver (ride ) / caregiver for 24 hours after surgery -- sig other, Tyler [name] Patient Special Instructions ----- asked to bring rescue inhaler and colostomy supplies Pre-Op special Istructions ----- n/a Patient verbalized understanding of instructions that were given at this phone interview. Patient denies shortness of breath, chest pain, fever, cough at this phone interview.

## 2022-07-08 ENCOUNTER — Encounter (HOSPITAL_BASED_OUTPATIENT_CLINIC_OR_DEPARTMENT_OTHER)
Admission: RE | Disposition: A | Discharge status: Home / Self Care | Payer: Self-pay | Source: Home / Self Care | Attending: General Surgery

## 2022-07-08 ENCOUNTER — Ambulatory Visit (HOSPITAL_BASED_OUTPATIENT_CLINIC_OR_DEPARTMENT_OTHER)
Admission: RE | Admit: 2022-07-08 | Discharge: 2022-07-08 | Disposition: A | Discharge status: Home / Self Care | Payer: BC Managed Care – PPO | Attending: General Surgery | Admitting: General Surgery

## 2022-07-08 ENCOUNTER — Ambulatory Visit (HOSPITAL_BASED_OUTPATIENT_CLINIC_OR_DEPARTMENT_OTHER): Payer: BC Managed Care – PPO | Admitting: Anesthesiology

## 2022-07-08 ENCOUNTER — Ambulatory Visit: Payer: BC Managed Care – PPO | Admitting: Internal Medicine

## 2022-07-08 ENCOUNTER — Encounter (HOSPITAL_BASED_OUTPATIENT_CLINIC_OR_DEPARTMENT_OTHER): Payer: Self-pay | Admitting: General Surgery

## 2022-07-08 ENCOUNTER — Other Ambulatory Visit: Payer: Self-pay

## 2022-07-08 DIAGNOSIS — Z9889 Other specified postprocedural states: Secondary | ICD-10-CM | POA: Diagnosis not present

## 2022-07-08 DIAGNOSIS — Z8719 Personal history of other diseases of the digestive system: Secondary | ICD-10-CM | POA: Insufficient documentation

## 2022-07-08 DIAGNOSIS — Z09 Encounter for follow-up examination after completed treatment for conditions other than malignant neoplasm: Secondary | ICD-10-CM | POA: Diagnosis not present

## 2022-07-08 DIAGNOSIS — Z01818 Encounter for other preprocedural examination: Secondary | ICD-10-CM

## 2022-07-08 DIAGNOSIS — Z933 Colostomy status: Secondary | ICD-10-CM | POA: Diagnosis not present

## 2022-07-08 DIAGNOSIS — K6289 Other specified diseases of anus and rectum: Secondary | ICD-10-CM | POA: Diagnosis present

## 2022-07-08 HISTORY — PX: RECTAL EXAM UNDER ANESTHESIA: SHX6399

## 2022-07-08 HISTORY — PX: FLEXIBLE SIGMOIDOSCOPY: SHX5431

## 2022-07-08 LAB — POCT I-STAT, CHEM 8
BUN: 13 mg/dL (ref 8–23)
Calcium, Ion: 1.14 mmol/L — ABNORMAL LOW (ref 1.15–1.40)
Chloride: 106 mmol/L (ref 98–111)
Creatinine, Ser: 0.8 mg/dL (ref 0.61–1.24)
Glucose, Bld: 87 mg/dL (ref 70–99)
HCT: 28 % — ABNORMAL LOW (ref 39.0–52.0)
Hemoglobin: 9.5 g/dL — ABNORMAL LOW (ref 13.0–17.0)
Potassium: 3.8 mmol/L (ref 3.5–5.1)
Sodium: 139 mmol/L (ref 135–145)
TCO2: 21 mmol/L — ABNORMAL LOW (ref 22–32)

## 2022-07-08 SURGERY — EXAM UNDER ANESTHESIA, RECTUM
Anesthesia: Monitor Anesthesia Care | Site: Rectum

## 2022-07-08 MED ORDER — ACETAMINOPHEN 325 MG RE SUPP: 650.0000 mg | RECTAL | Status: DC | PRN

## 2022-07-08 MED ORDER — 0.9 % SODIUM CHLORIDE (POUR BTL) OPTIME
TOPICAL | Status: DC | PRN
  Administered 2022-07-08: 500 mL

## 2022-07-08 MED ORDER — KETAMINE HCL 50 MG/5ML IJ SOSY
PREFILLED_SYRINGE | INTRAMUSCULAR | Status: AC
  Filled 2022-07-08: qty 5

## 2022-07-08 MED ORDER — PROPOFOL 500 MG/50ML IV EMUL
INTRAVENOUS | Status: DC | PRN
  Administered 2022-07-08: 100 ug/kg/min via INTRAVENOUS

## 2022-07-08 MED ORDER — ACETAMINOPHEN 500 MG PO TABS
1000.0000 mg | ORAL_TABLET | Freq: Once | ORAL | Status: AC
Start: 2022-07-08 — End: 2022-07-08
  Administered 2022-07-08: 1000 mg via ORAL

## 2022-07-08 MED ORDER — BUPIVACAINE-EPINEPHRINE 0.5% -1:200000 IJ SOLN
INTRAMUSCULAR | Status: DC | PRN
  Administered 2022-07-08: 10 mL

## 2022-07-08 MED ORDER — LACTATED RINGERS IV SOLN
INTRAVENOUS | Status: DC
Start: 2022-07-08 — End: 2022-07-08

## 2022-07-08 MED ORDER — MIDAZOLAM HCL 5 MG/5ML IJ SOLN
INTRAMUSCULAR | Status: DC | PRN
  Administered 2022-07-08: 2 mg via INTRAVENOUS

## 2022-07-08 MED ORDER — GLYCOPYRROLATE PF 0.2 MG/ML IJ SOSY
PREFILLED_SYRINGE | INTRAMUSCULAR | Status: DC | PRN
  Administered 2022-07-08: .2 mg via INTRAVENOUS

## 2022-07-08 MED ORDER — PROPOFOL 10 MG/ML IV BOLUS
INTRAVENOUS | Status: DC | PRN
  Administered 2022-07-08 (×2): 20 mg via INTRAVENOUS

## 2022-07-08 MED ORDER — SODIUM CHLORIDE 0.9% FLUSH: 3.0000 mL | Freq: Two times a day (BID) | INTRAVENOUS | Status: DC

## 2022-07-08 MED ORDER — FENTANYL CITRATE (PF) 250 MCG/5ML IJ SOLN
INTRAMUSCULAR | Status: DC | PRN
  Administered 2022-07-08 (×2): 25 ug via INTRAVENOUS

## 2022-07-08 MED ORDER — AMISULPRIDE (ANTIEMETIC) 5 MG/2ML IV SOLN: 10.0000 mg | Freq: Once | INTRAVENOUS | Status: DC | PRN

## 2022-07-08 MED ORDER — PROPOFOL 10 MG/ML IV BOLUS
INTRAVENOUS | Status: AC
  Filled 2022-07-08: qty 20

## 2022-07-08 MED ORDER — FENTANYL CITRATE (PF) 100 MCG/2ML IJ SOLN: 25.0000 ug | INTRAMUSCULAR | Status: DC | PRN

## 2022-07-08 MED ORDER — LIDOCAINE HCL (PF) 2 % IJ SOLN
INTRAMUSCULAR | Status: AC
  Filled 2022-07-08: qty 5

## 2022-07-08 MED ORDER — MIDAZOLAM HCL 2 MG/2ML IJ SOLN
INTRAMUSCULAR | Status: AC
  Filled 2022-07-08: qty 2

## 2022-07-08 MED ORDER — SODIUM CHLORIDE 0.9 % IV SOLN: 250.0000 mL | INTRAVENOUS | Status: DC | PRN

## 2022-07-08 MED ORDER — ACETAMINOPHEN 500 MG PO TABS
ORAL_TABLET | ORAL | Status: AC
  Filled 2022-07-08: qty 2

## 2022-07-08 MED ORDER — KETAMINE HCL 10 MG/ML IJ SOLN
INTRAMUSCULAR | Status: DC | PRN
  Administered 2022-07-08: 10 mg via INTRAVENOUS
  Administered 2022-07-08: 20 mg via INTRAVENOUS

## 2022-07-08 MED ORDER — ONDANSETRON HCL 4 MG/2ML IJ SOLN: 4.0000 mg | Freq: Once | INTRAMUSCULAR | Status: DC | PRN

## 2022-07-08 MED ORDER — ONDANSETRON HCL 4 MG/2ML IJ SOLN
INTRAMUSCULAR | Status: DC | PRN
  Administered 2022-07-08: 4 mg via INTRAVENOUS

## 2022-07-08 MED ORDER — OXYCODONE HCL 5 MG PO TABS: 5.0000 mg | ORAL_TABLET | ORAL | Status: DC | PRN

## 2022-07-08 MED ORDER — FENTANYL CITRATE (PF) 100 MCG/2ML IJ SOLN
INTRAMUSCULAR | Status: AC
  Filled 2022-07-08: qty 2

## 2022-07-08 MED ORDER — ACETAMINOPHEN 325 MG PO TABS: 650.0000 mg | ORAL_TABLET | ORAL | Status: DC | PRN

## 2022-07-08 MED ORDER — SODIUM CHLORIDE 0.9% FLUSH: 3.0000 mL | INTRAVENOUS | Status: DC | PRN

## 2022-07-08 SURGICAL SUPPLY — 32 items
COVER BACK TABLE 60X90IN (DRAPES) ×1 IMPLANT
COVER MAYO STAND STRL (DRAPES) ×1 IMPLANT
DRAPE HYSTEROSCOPY (MISCELLANEOUS) IMPLANT
DRAPE SHEET LG 3/4 BI-LAMINATE (DRAPES) IMPLANT
DRAPE UTILITY XL STRL (DRAPES) ×1 IMPLANT
DRSG PAD ABDOMINAL 8X10 ST (GAUZE/BANDAGES/DRESSINGS) ×1 IMPLANT
ELECT REM PT RETURN 9FT ADLT (ELECTROSURGICAL) ×1
ELECTRODE REM PT RTRN 9FT ADLT (ELECTROSURGICAL) ×1 IMPLANT
GAUZE 4X4 16PLY ~~LOC~~+RFID DBL (SPONGE) ×1 IMPLANT
GAUZE SPONGE 4X4 12PLY STRL (GAUZE/BANDAGES/DRESSINGS) ×1 IMPLANT
GLOVE BIO SURGEON STRL SZ 6.5 (GLOVE) ×1 IMPLANT
GLOVE BIOGEL PI IND STRL 6.5 (GLOVE) IMPLANT
GLOVE BIOGEL PI IND STRL 7.0 (GLOVE) ×1 IMPLANT
GLOVE BIOGEL PI INDICATOR 6.5 (GLOVE) ×1
GLOVE BIOGEL PI INDICATOR 7.0 (GLOVE) ×1
GLOVE INDICATOR 6.5 STRL GRN (GLOVE) ×1 IMPLANT
GOWN STRL REUS W/ TWL LRG LVL3 (GOWN DISPOSABLE) IMPLANT
GOWN STRL REUS W/TWL LRG LVL3 (GOWN DISPOSABLE) ×1
GOWN STRL REUS W/TWL XL LVL3 (GOWN DISPOSABLE) ×1 IMPLANT
KIT TURNOVER CYSTO (KITS) ×1 IMPLANT
LEGGING LITHOTOMY PAIR STRL (DRAPES) IMPLANT
NEEDLE HYPO 22GX1.5 SAFETY (NEEDLE) ×1 IMPLANT
NS IRRIG 500ML POUR BTL (IV SOLUTION) ×1 IMPLANT
PACK BASIN DAY SURGERY FS (CUSTOM PROCEDURE TRAY) ×1 IMPLANT
PANTS MESH DISP LRG (UNDERPADS AND DIAPERS) ×1 IMPLANT
PENCIL SMOKE EVACUATOR (MISCELLANEOUS) ×1 IMPLANT
SPIKE FLUID TRANSFER (MISCELLANEOUS) IMPLANT
SYR CONTROL 10ML LL (SYRINGE) ×1 IMPLANT
TOWEL OR 17X26 10 PK STRL BLUE (TOWEL DISPOSABLE) ×1 IMPLANT
TRAY DSU PREP LF (CUSTOM PROCEDURE TRAY) ×1 IMPLANT
TUBE CONNECTING 12X1/4 (SUCTIONS) ×1 IMPLANT
YANKAUER SUCT BULB TIP NO VENT (SUCTIONS) ×1 IMPLANT

## 2022-07-08 NOTE — H&P (Signed)
HPI: Tyler [name] is an 71 y.o. male who is here for evaluation after anal necrosis after hemorrhoidopexy, requiring diverting colostomy.  All of his drains are out and he is finishing up his antibiotics.    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), bilateral    Chronic pruritic rash in adult 2004   hx shingles  w/ residual recurrent rash, take valtrex daily   Colostomy in place [facility]) 03/20/2022   ED (erectile dysfunction)    GERD (gastroesophageal reflux disease)    H/O pityriasis rosea    History of basal cell carcinoma (BCC) excision    2022 left calf s/p mohs   History of pertussis 2018   History of SCC (squamous cell carcinoma) of skin    removed from head   History of thrombocytopenia 12/03/2010   Hx of adenomatous polyp of colon 04/07/2017   Hypertension    Mild asthma    followed by pcp   OA (osteoarthritis)    knees, fingers   Prolapsed internal hemorrhoids, grade 3    hx  external hemorroid banding   Sepsis due to Enterococcus ([place]) 03/09/2022   followed by ID -- dr synder   Wears glasses     Past Surgical History:  Procedure Laterality Date   COLONOSCOPY WITH PROPOFOL  04/01/2017   by dr Carlean Purl   DIRECT LARYNGOSCOPY  1986   left vocal fold bx   (non-cancerous granuloma)   FINGER SURGERY Left 2017   ligament and tendon repair left index finger   HEMORRHOID SURGERY N/A 03/15/2022   Procedure: ANAL EXAM UNDER ANESTHESIA WITH PROCTOSCOPY  AND DEBRIDEMENT;  Surgeon: Leighton Ruff, MD;  Location: [facility];  Service: General;  Laterality: N/A;   INGUINAL HERNIA REPAIR Right 05/25/2018   Procedure: RIGHT INGUINAL HERNIA REPAIR WITH MESH;  Surgeon:  Jovita Kussmaul, MD;  Location: [place];  Service: General;  Laterality: Right;   INSERTION OF MESH Right 05/25/2018   Procedure: INSERTION OF MESH;  Surgeon: Jovita Kussmaul, MD;  Location: [place];  Service: General;  Laterality: Right;   IR RADIOLOGIST EVAL & MGMT  05/04/2022   KNEE ARTHROSCOPY W/ MENISCAL REPAIR Right 10/26/2005   '@[facility]'$   by Dr  Wynelle Link   LAPAROSCOPIC APPENDECTOMY  03/22/2011   '@[facility]'$    LAPAROSCOPY N/A 03/20/2022   Procedure: DIAGNOSTIC LAPAROSCOPY; ABDOMINAL [place] OUT AND DRAIN PLACEMENT; CREATION OF DIVERTING COLOSTOMY; ANAL EXAM UNDER ANESTHESIA;  Surgeon: Leighton Ruff, MD;  Location: [facility];  Service: General;  Laterality: N/A;   MOHS SURGERY  05/2021   left lower leg   TONSILLECTOMY  1958   TRANSANAL HEMORRHOIDAL DEARTERIALIZATION N/A 03/05/2022   Procedure: TRANSANAL HEMORRHOIDAL DEARTERIALIZATION;  Surgeon: Leighton Ruff, MD;  Location: [place];  Service: General;  Laterality: N/A;    Family History  Problem Relation Age of Onset   AAA (abdominal aortic aneurysm) Mother    Glaucoma Sister    Stomach cancer Paternal Grandfather    Colon cancer Neg Hx     Social:  reports that he quit smoking about 33 years ago. His smoking use included cigarettes. He has never used smokeless tobacco. He reports current alcohol  use of about 14.0 standard drinks of alcohol per week. He reports that he does not use drugs.  Allergies:  Allergies  Allergen Reactions   Fluconazole Hives, Rash and Other (See Comments)    Shingles activated    Griseofulvin Anaphylaxis, Swelling, Rash and Other (See Comments)    Throat swelling     Penicillins Rash and Other (See Comments)    High fever, tolerates Cefepime TOLERATING ZOSYN [id number]   Sulfa Antibiotics Other (See Comments)    Joints ache, swell and caused inflammation    Oxycodone Nausea Only and Other (See Comments)    Nauseous to the point of almost vomiting   Mometasone  Furo-Formoterol Fum Other (See Comments)    Lack of therapeutic effect    Retapamulin Rash    Medications: I have reviewed the patient's current medications.  No results found for this or any previous visit (from the past 48 hour(s)).  No results found.  ROS - all of the below systems have been reviewed with the patient and positives are indicated with bold text General: chills, fever or night sweats Eyes: blurry vision or double vision ENT: epistaxis or sore throat Hematologic/Lymphatic: bleeding problems, blood clots or swollen lymph nodes Endocrine: temperature intolerance or unexpected weight changes Breast: new or changing breast lumps or nipple discharge Resp: cough, shortness of breath, or wheezing CV: chest pain or dyspnea on exertion GI: as per HPI GU: dysuria, trouble voiding, or hematuria Neuro: TIA or stroke symptoms    PE Height '5\' 9"'$  (1.753 m), weight 74.8 kg. Constitutional: NAD; conversant; no deformities Eyes: Moist conjunctiva; no lid lag; anicteric; PERRL Neck: Trachea midline; no thyromegaly Lungs: Normal respiratory effort CV: RRR GI: Abd soft MSK: Normal range of motion of extremities; no clubbing/cyanosis Psychiatric: Appropriate affect; alert and oriented x3  No results found for this or any previous visit (from the past 48 hour(s)).  No results found.   A/P: Tyler [name] is an 71 y.o. male with anal necrosis s/p diverting colostomy  Risks of surgery include bleeding, infection and perforation.  I believe these risks are minimal at this time.   Rosario Adie, MD  [facility]

## 2022-07-08 NOTE — Op Note (Signed)
07/08/2022  9:52 AM  PATIENT:  Tyler [name]  71 y.o. male  Patient Care Team: Cari Caraway, MD as PCP - General (Family Medicine) Leighton Ruff, MD as Consulting Physician (General Surgery) Gatha Mayer, MD as Consulting Physician (Gastroenterology)  PRE-OPERATIVE DIAGNOSIS:  anal necrosis, h/o rectal perforation  POST-OPERATIVE DIAGNOSIS:  same  PROCEDURE:  RECTAL EXAM UNDER ANESTHESIA FLEXIBLE SIGMOIDOSCOPY    Surgeon(s): Leighton Ruff, MD  ASSISTANT: none   ANESTHESIA:   local and MAC  SPECIMEN:  No Specimen  DISPOSITION OF SPECIMEN:  N/A  COUNTS:  YES  PLAN OF CARE: Discharge to home after PACU  PATIENT DISPOSITION:  PACU - hemodynamically stable.  INDICATION: Patient is status post posterior anal necrosis after hemorrhoid surgery resulting in rectal perforation requiring diverting colostomy   OR FINDINGS: Anal necrosis appears to have healed.  No signs of perforation or injury noted.  DESCRIPTION: the patient was identified in the preoperative holding area and taken to the OR where they were laid on the operating room table.  MAC anesthesia was induced without difficulty. The patient was then positioned in prone jackknife position with buttocks gently taped apart.  The patient was then prepped and draped in usual sterile fashion.  SCDs were noted to be in place prior to the initiation of anesthesia. A surgical timeout was performed indicating the correct patient, procedure, positioning and need for preoperative antibiotics.  A rectal block was performed using Marcaine with epinephrine.    I began with a digital rectal exam.  The previous area of perforation could be palpated.  I then placed a bivalve speculum anoscope into the anal canal and evaluated this completely.  There was no sign of injury.  We then introduced the sigmoidoscope.  This was easily advanced to the rectosigmoid junction and slowly withdrawn.  There was no sign of injury noted.  Scar was  noted in the left posterior distal rectum.  Anal canal with moderate tone.  Rosario Adie, MD  [facility]

## 2022-07-08 NOTE — Transfer of Care (Signed)
Immediate Anesthesia Transfer of Care Note  Patient: Tyler [name]  Procedure(s) Performed: RECTAL EXAM UNDER ANESTHESIA (Rectum) FLEXIBLE SIGMOIDOSCOPY (Rectum)  Patient Location: PACU  Anesthesia Type:MAC  Level of Consciousness: awake, alert , oriented and patient cooperative  Airway & Oxygen Therapy: Patient Spontanous Breathing  Post-op Assessment: Report given to RN and Post -op Vital signs reviewed and stable  Post vital signs: Reviewed and stable  Last Vitals:  Vitals Value Taken Time  BP 101/65 07/08/22 0918  Temp    Pulse 81 07/08/22 0921  Resp 17 07/08/22 0921  SpO2 95 % 07/08/22 0921  Vitals shown include unvalidated device data.  Last Pain:  Vitals:   07/08/22 0714  TempSrc: Oral  PainSc: 0-No pain      Patients Stated Pain Goal: 5 (09/60/45 4098)  Complications: No notable events documented.

## 2022-07-08 NOTE — Anesthesia Preprocedure Evaluation (Signed)
Anesthesia Evaluation  Patient identified by MRN, date of birth, ID band Patient awake    Reviewed: Allergy & Precautions, NPO status , Patient's Chart, lab work & pertinent test results  History of Anesthesia Complications Negative for: history of anesthetic complications  Airway Mallampati: I  TM Distance: >3 FB Neck ROM: Full    Dental  (+) Teeth Intact   Pulmonary asthma , former smoker,    Pulmonary exam normal        Cardiovascular hypertension, Pt. on medications and Pt. on home beta blockers Normal cardiovascular exam     Neuro/Psych Anxiety Depression negative neurological ROS     GI/Hepatic Neg liver ROS, GERD  ,  Endo/Other  negative endocrine ROS  Renal/GU negative Renal ROS  negative genitourinary   Musculoskeletal  (+) Arthritis ,   Abdominal   Peds  Hematology  (+) Blood dyscrasia, anemia ,   Anesthesia Other Findings   Reproductive/Obstetrics                            Anesthesia Physical Anesthesia Plan  ASA: 3  Anesthesia Plan: MAC   Post-op Pain Management: Tylenol PO (pre-op)* and Toradol IV (intra-op)*   Induction: Intravenous  PONV Risk Score and Plan: 1 and Propofol infusion, TIVA and Treatment may vary due to age or medical condition  Airway Management Planned: Natural Airway, Nasal Cannula and Simple Face Mask  Additional Equipment: None  Intra-op Plan:   Post-operative Plan:   Informed Consent: I have reviewed the patients History and Physical, chart, labs and discussed the procedure including the risks, benefits and alternatives for the proposed anesthesia with the patient or authorized representative who has indicated his/her understanding and acceptance.       Plan Discussed with:   Anesthesia Plan Comments:         Anesthesia Quick Evaluation

## 2022-07-08 NOTE — Discharge Instructions (Addendum)
ANORECTAL SURGERY: POST OP INSTRUCTIONS Take your usually prescribed home medications unless otherwise directed. DIET: During the first few hours after surgery sip on some liquids until you are able to urinate.  It is normal to not urinate for several hours after this surgery.  If you feel uncomfortable, please contact the office for instructions.  After you are able to urinate,you may eat, if you feel like it.  Follow a light bland diet the first 24 hours after arrival home, such as soup, liquids, crackers, etc.  Be sure to include lots of fluids daily (6-8 glasses).  Avoid fast food or heavy meals, as your are more likely to get nauseated.  Eat a low fat diet the next few days after surgery.  Limit caffeine intake to 1-2 servings a day. PAIN CONTROL: Pain is best controlled by a usual combination of several different methods TOGETHER: Muscle relaxation  Soak in a warm bath (or Sitz bath) three times a day and after bowel movements.  Continue to do this until all pain is resolved. Take the muscle relaxer (Valium) every 6 hours for the first 2 days after surgery  Over the counter pain medication Prescription pain medication Most patients will experience some swelling and discomfort in the anus/rectal area and incisions.  Heat such as warm towels, sitz baths, warm baths, etc to help relax tight/sore spots and speed recovery.  Some people prefer to use ice, especially in the first couple days after surgery, as it may decrease the pain and swelling, or alternate between ice & heat.  Experiment to what works for you.  Swelling and bruising can take several weeks to resolve.  Pain can take even longer to completely resolve. It is helpful to take an over-the-counter pain medication regularly for the first few weeks.  Choose one of the following that works best for you: Naproxen (Aleve, etc)  Two '220mg'$  tabs twice a day Ibuprofen (Advil, etc) Three '200mg'$  tabs four times a day (every meal & bedtime) A   prescription for pain medication (such as percocet, oxycodone, hydrocodone, etc) should be given to you upon discharge.  Take your pain medication as prescribed.  If you are having problems/concerns with the prescription medicine (does not control pain, nausea, vomiting, rash, itching, etc), please call us [phone] to see if we need to switch you to a different pain medicine that will work better for you and/or control your side effect better. If you need a refill on your pain medication, please contact your pharmacy.  They will contact our office to request authorization. Prescriptions will not be filled after 5 pm or on week-ends. KEEP YOUR BOWELS REGULAR and AVOID CONSTIPATION The goal is one to two soft bowel movements a day.  You should at least have a bowel movement every other day. Avoid getting constipated.  Between the surgery and the pain medications, it is common to experience some constipation. This can be very painful after rectal surgery.  Increasing fluid intake and taking a fiber supplement (such as Metamucil, Citrucel, FiberCon, etc) 1-2 times a day regularly will usually help prevent this problem from occurring.  A stool softener like colace is also recommended.  This can be purchased over the counter at your pharmacy.  You can take it up to 3 times a day.  If you do not have a bowel movement after 24 hrs since your surgery, take one does of milk of magnesia.  If you still haven't had a bowel movement 8-12 hours after  that dose, take another dose.  If you don't have a bowel movement 48 hrs after surgery, purchase a Fleets enema from the drug store and administer gently per package instructions.  If you still are having trouble with your bowel movements after that, please call the office for further instructions. If you develop diarrhea or have many loose bowel movements, simplify your diet to bland foods & liquids for a few days.  Stop any stool softeners and decrease your fiber  supplement.  Switching to mild anti-diarrheal medications (Kayopectate, Pepto Bismol) can help.  If this worsens or does not improve, please call us.  Wound Care Remove your bandages before your first bowel movement or 8 hours after surgery.     Remove any wound packing material at this tim,e as well.  You do not need to repack the wound unless instructed otherwise.  Wear an absorbent pad or soft cotton gauze in your underwear to catch any drainage and help keep the area clean. You should change this every 2-3 hours while awake. Keep the area clean and dry.  Bathe / shower every day, especially after bowel movements.  Keep the area clean by showering / bathing over the incision / wound.   It is okay to soak an open wound to help wash it.  Wet wipes or showers / gentle washing after bowel movements is often less traumatic than regular toilet paper. You may have some styrofoam-like soft packing in the rectum which will come out with the first bowel movement.  You will often notice bleeding with bowel movements.  This should slow down by the end of the first week of surgery Expect some drainage.  This should slow down, too, by the end of the first week of surgery.  Wear an absorbent pad or soft cotton gauze in your underwear until the drainage stops. Do Not sit on a rubber or pillow ring.  This can make you symptoms worse.  You may sit on a soft pillow if needed.  ACTIVITIES as tolerated:   You may resume regular (light) daily activities beginning the next day--such as daily self-care, walking, climbing stairs--gradually increasing activities as tolerated.  If you can walk 30 minutes without difficulty, it is safe to try more intense activity such as jogging, treadmill, bicycling, low-impact aerobics, swimming, etc. Save the most intensive and strenuous activity for last such as sit-ups, heavy lifting, contact sports, etc  Refrain from any heavy lifting or straining until you are off narcotics for pain  control.   You may drive when you are no longer taking prescription pain medication, you can comfortably sit for long periods of time, and you can safely maneuver your car and apply brakes. You may have sexual intercourse when it is comfortable.  FOLLOW UP in our office Please call [facility] at [phone] to set up an appointment to see your surgeon in the office for a follow-up appointment approximately 3-4 weeks after your surgery. Make sure that you call for this appointment the day you arrive home to insure a convenient appointment time. 10. IF YOU HAVE DISABILITY OR FAMILY LEAVE FORMS, BRING THEM TO THE OFFICE FOR PROCESSING.  DO NOT GIVE THEM TO YOUR DOCTOR.     WHEN TO CALL us [phone]: Poor pain control Reactions / problems with new medications (rash/itching, nausea, etc)  Fever over 101.5 F (38.5 C) Inability to urinate Nausea and/or vomiting Worsening swelling or bruising Continued bleeding from incision. Increased pain, redness, or drainage from the  incision  The clinic staff is available to answer your questions during regular business hours (8:30am-5pm).  Please don't hesitate to call and ask to speak to one of our nurses for clinical concerns.   A surgeon from [facility] is always on call at the hospitals   If you have a medical emergency, go to the nearest emergency room or call 911.    [facility], [place], [place], [place], [place]  [id number] ? MAIN: [phone] ? TOLL FREE: [phone] ? FAX [phone] [email or web address]     Post Anesthesia Home Care Instructions  Activity: Get plenty of rest for the remainder of the day. A responsible individual must stay with you for 24 hours following the procedure.  For the next 24 hours, DO NOT: -Drive a car -Paediatric nurse -Drink alcoholic beverages -Take any medication unless instructed by your physician -Make any legal decisions or sign  important papers.  Meals: Start with liquid foods such as gelatin or soup. Progress to regular foods as tolerated. Avoid greasy, spicy, heavy foods. If nausea and/or vomiting occur, drink only clear liquids until the nausea and/or vomiting subsides. Call your physician if vomiting continues.  Special Instructions/Symptoms: Your throat may feel dry or sore from the anesthesia or the breathing tube placed in your throat during surgery. If this causes discomfort, gargle with warm salt water. The discomfort should disappear within 24 hours.   Do not take any Tylenol until after 1:20 pm today if needed.

## 2022-07-08 NOTE — Anesthesia Postprocedure Evaluation (Signed)
Anesthesia Post Note  Patient: Tyler [name]  Procedure(s) Performed: RECTAL EXAM UNDER ANESTHESIA (Rectum) FLEXIBLE SIGMOIDOSCOPY (Rectum)     Patient location during evaluation: PACU Anesthesia Type: MAC Level of consciousness: awake and alert Pain management: pain level controlled Vital Signs Assessment: post-procedure vital signs reviewed and stable Respiratory status: spontaneous breathing, nonlabored ventilation and respiratory function stable Cardiovascular status: blood pressure returned to baseline and stable Postop Assessment: no apparent nausea or vomiting Anesthetic complications: no   No notable events documented.  Last Vitals:  Vitals:   07/08/22 0950 07/08/22 1034  BP: 110/73 117/87  Pulse: 72 65  Resp: 14 16  Temp: (!) 36.1 C 36.7 C  SpO2: 98% 100%    Last Pain:  Vitals:   07/08/22 1034  TempSrc: Oral  PainSc:                  Lidia Collum

## 2022-07-09 ENCOUNTER — Encounter (HOSPITAL_BASED_OUTPATIENT_CLINIC_OR_DEPARTMENT_OTHER): Payer: Self-pay | Admitting: General Surgery

## 2022-07-12 ENCOUNTER — Encounter: Payer: Self-pay | Admitting: Internal Medicine

## 2022-07-12 ENCOUNTER — Ambulatory Visit (INDEPENDENT_AMBULATORY_CARE_PROVIDER_SITE_OTHER): Payer: BC Managed Care – PPO | Admitting: Internal Medicine

## 2022-07-12 ENCOUNTER — Other Ambulatory Visit: Payer: Self-pay

## 2022-07-12 VITALS — BP 127/81 | HR 75 | Temp 98.0°F | Ht 69.0 in | Wt 169.0 lb

## 2022-07-12 DIAGNOSIS — K651 Peritoneal abscess: Secondary | ICD-10-CM

## 2022-07-12 DIAGNOSIS — T8143XA Infection following a procedure, organ and space surgical site, initial encounter: Secondary | ICD-10-CM

## 2022-07-12 NOTE — Progress Notes (Signed)
RFV: follow up for pelvic/thigh abscess  Patient ID: Tyler [name], male   DOB: 06-24-1951, 71 y.o.   MRN: [id number]  HPI Tyler [name] is a 76JO M with complicated pelvic abscess after complications from hemorrhoid surgery s/p colostomy Pulled out drain last week, looking healed .  Reversal is slated for November  Walks 1/2 of mile before he notices that his left leg  Having worsening edema  L>R  He sees urology this week  Stopped abtx last Thursday;  foul smelling flatuance in the last 2 days but not necessarily having more output in ostomy  Outpatient Encounter Medications as of 07/12/2022  Medication Sig   acetaminophen (TYLENOL) 500 MG tablet Take 2 tablets (1,000 mg total) by mouth every 6 (six) hours as needed for mild pain.   albuterol (VENTOLIN HFA) 108 (90 Base) MCG/ACT inhaler Inhale 2 puffs into the lungs every 6 (six) hours as needed for wheezing or shortness of breath.   azelastine (ASTELIN) 0.1 % nasal spray Place 1 spray into both nostrils 2 (two) times daily as needed for rhinitis. Use in each nostril as directed   bisoprolol (ZEBETA) 10 MG tablet Take 1 tablet (10 mg total) by mouth at bedtime.   escitalopram (LEXAPRO) 10 MG tablet Take 15 mg by mouth daily.   esomeprazole (NEXIUM) 20 MG capsule Take 1 capsule (20 mg total) by mouth daily before breakfast.   ibuprofen (ADVIL) 400 MG tablet Take 1-2 tablets (400-800 mg total) by mouth every 8 (eight) hours as needed for fever, mild pain or cramping.   melatonin 3 MG TABS tablet Take 1 tablet (3 mg total) by mouth at bedtime. (Patient taking differently: Take 3 mg by mouth at bedtime as needed.)   montelukast (SINGULAIR) 10 MG tablet Take 1 tablet (10 mg total) by mouth at bedtime.   Multiple Vitamin (MULTIVITAMIN WITH MINERALS) TABS tablet Take 1 tablet by mouth daily.   PSYLLIUM PO Take 1 packet by mouth daily after lunch. States taking gummies   valACYclovir (VALTREX) 500 MG tablet Take 500 mg by mouth daily.    amLODipine (NORVASC) 10 MG tablet Take 10 mg by mouth daily. (Patient not taking: Reported on 07/12/2022)   amoxicillin-clavulanate (AUGMENTIN) 875-125 MG tablet Take 1 tablet by mouth 2 (two) times daily. (Patient not taking: Reported on 07/12/2022)   ciprofloxacin (CIPRO) 500 MG tablet Take 1 tablet (500 mg total) by mouth 2 (two) times daily. (Patient not taking: Reported on 07/12/2022)   loperamide (IMODIUM) 2 MG capsule Take 1 capsule (2 mg total) by mouth with breakfast, with lunch, and with evening meal. (Patient not taking: Reported on 06/29/2022)   LORazepam (ATIVAN) 0.5 MG tablet Take 1-2 tablets (0.5-1 mg total) by mouth every 12 (twelve) hours as needed for anxiety, sedation or sleep. (Patient not taking: Reported on 07/12/2022)   tadalafil (CIALIS) 5 MG tablet  (Patient not taking: Reported on 07/12/2022)   traMADol (ULTRAM) 50 MG tablet Take 1-2 tablets (50-100 mg total) by mouth every 6 (six) hours as needed for moderate pain or severe pain. (Patient not taking: Reported on 07/12/2022)   No facility-administered encounter medications on file as of 07/12/2022.     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 04/03/2022   Protein-calorie malnutrition, severe ([place]) 04/03/2022   Anxiety associated with depression 04/03/2022   Intra-abdominal abscess ([place]) 04/03/2022   Thigh abscess 04/03/2022   Normal anion gap metabolic acidosis 23/53/6144   Acute blood loss anemia (ABLA) 03/18/2022   Acute urinary retention 03/12/2022   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/2022   Mild persistent asthma 03/12/2022   Abdominal pain 03/09/2022   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/09/2022   Pruritus ani 04/20/2018   Arthritis of both knees 01/13/2018   Hx of adenomatous polyp of colon 04/07/2017   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/12/2013   History of tobacco use 11/12/2013   Low testosterone 09/19/2013   Sinusitis 12/21/2012   Herpes labialis 10/30/2012   Eczema 10/30/2012   Prolapsed internal hemorrhoids, grade 3, THD ligation 03/07/2022 10/30/2012   Hypertension 04/13/2011   FASCIITIS, PLANTAR 05/14/2010   NEOPLASM, SKIN, UNCERTAIN BEHAVIOR 31/54/0086   Chronic rhinitis 08/16/2008   Rash and other nonspecific skin eruption 07/02/2008   ABNORMAL COAGULATION PROFILE 05/29/2008   Gastroesophageal reflux disease 06/28/2007   PITYRIASIS ROSEA 06/28/2007   Osteoarthritis of knee 06/28/2007   Asthmatic bronchitis 05/11/2007     Health Maintenance Due  Topic Date Due   Hepatitis C Screening  Never done   Zoster Vaccines- Shingrix (1 of 2) Never done   Pneumonia Vaccine 50+ Years old (1 - PCV) Never done   COVID-19 Vaccine (5 - Pfizer risk series) 04/12/2021   INFLUENZA VACCINE  06/15/2022     Review of Systems 12 point ors is negative other than what is mentioned in hpi Physical Exam   BP 127/81   Pulse 75   Temp 98 F (36.7 C) (Oral)   Ht '5\' 9"'$  (1.753 m)   Wt 169 lb (76.7 kg)   SpO2 97%   BMI 24.96 kg/m   Gen = a x o by 3 in nad Abd= ostomy in place, no surrounding erythema  CBC Lab Results  Component Value Date   WBC 9.5 06/10/2022   RBC 3.60 (L) 06/10/2022   HGB 9.5 (L) 07/08/2022   HCT 28.0 (L) 07/08/2022   PLT 285 06/10/2022   MCV 90.0 06/10/2022   MCH 29.4 06/10/2022   MCHC 32.7 06/10/2022   RDW 14.9 06/10/2022   LYMPHSABS 2,290 06/10/2022   MONOABS 0.7 04/28/2022    EOSABS 57 06/10/2022    BMET Lab Results  Component Value Date   NA 139 07/08/2022   K 3.8 07/08/2022   CL 106 07/08/2022   CO2 25 06/10/2022   GLUCOSE 87 07/08/2022   BUN 13 07/08/2022   CREATININE 0.80 07/08/2022   CALCIUM 9.2 06/10/2022   GFRNONAA >60 04/27/2022   GFRAA  03/23/2022    QUESTIONABLE RESULTS, RECOMMEND RECOLLECT TO VERIFY    Lab Results  Component Value Date   ESRSEDRATE 28 (H) 06/01/2022   Lab Results  Component Value Date   CRP 43.5 (H) 06/01/2022     Assessment and Plan Pelvic abscess = appears not resolved with drain removed. We will continue to monitor Off of abtx, doing well  Come on back if needed

## 2022-07-14 ENCOUNTER — Other Ambulatory Visit: Payer: Self-pay | Admitting: General Surgery

## 2022-07-14 DIAGNOSIS — Z933 Colostomy status: Secondary | ICD-10-CM

## 2022-07-15 ENCOUNTER — Ambulatory Visit
Admission: RE | Admit: 2022-07-15 | Discharge: 2022-07-15 | Disposition: A | Discharge status: Home / Self Care | Payer: BC Managed Care – PPO | Source: Ambulatory Visit | Attending: General Surgery | Admitting: General Surgery

## 2022-07-15 ENCOUNTER — Other Ambulatory Visit: Payer: Self-pay | Admitting: General Surgery

## 2022-07-15 DIAGNOSIS — Z933 Colostomy status: Secondary | ICD-10-CM

## 2022-07-26 ENCOUNTER — Ambulatory Visit: Payer: Self-pay | Admitting: General Surgery

## 2022-07-26 DIAGNOSIS — Z01818 Encounter for other preprocedural examination: Secondary | ICD-10-CM

## 2022-07-26 NOTE — H&P (Signed)
Patient status post trans hemorrhoidal dearterialization with unfortunate complication of necrosis of the posterior anal canal.  He ended up undergoing a abdominal washout and diverting colostomy.  He is status post drain placement.  Most of his drains have been removed.  His pelvic surgical drain is out.  He came off his antibiotics and started developing fevers.  CT scan was performed did not show any obvious source of infection.  His antibiotics were restarted for 3 more weeks.  These are now off with no further fevers.  He is eating well and gaining weight.    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   ED (erectile dysfunction)    GERD (gastroesophageal reflux disease)    H/O pityriasis rosea    History of basal cell carcinoma (BCC) excision    2022 left calf s/p mohs   History of pertussis 2018   History of SCC (squamous cell carcinoma) of skin    removed from head   History of thrombocytopenia 12/03/2010   Hx of adenomatous polyp of colon 04/07/2017   Hypertension    Mild asthma    followed by pcp   OA (osteoarthritis)    knees, fingers   Prolapsed internal hemorrhoids, grade 3    hx  external hemorroid banding   Sepsis due to Enterococcus ([place]) 03/09/2022   followed by ID -- dr synder   Wears glasses    Family History  Problem Relation Age of Onset   AAA (abdominal aortic aneurysm) Mother    Glaucoma Sister    Stomach cancer Paternal Grandfather    Colon cancer Neg Hx    Past Surgical History:  Procedure  Laterality Date   COLONOSCOPY WITH PROPOFOL  04/01/2017   by dr Carlean Purl   DIRECT LARYNGOSCOPY  1986   left vocal fold bx   (non-cancerous granuloma)   FINGER SURGERY Left 2017   ligament and tendon repair left index finger   FLEXIBLE SIGMOIDOSCOPY N/A 07/08/2022   Procedure: FLEXIBLE SIGMOIDOSCOPY;  Surgeon: Leighton Ruff, MD;  Location: [place];  Service: General;  Laterality: N/A;   HEMORRHOID SURGERY N/A 03/15/2022   Procedure: ANAL EXAM UNDER ANESTHESIA WITH PROCTOSCOPY  AND DEBRIDEMENT;  Surgeon: Leighton Ruff, MD;  Location: [facility];  Service: General;  Laterality: N/A;   INGUINAL HERNIA REPAIR Right 05/25/2018   Procedure: RIGHT INGUINAL HERNIA REPAIR WITH MESH;  Surgeon: Jovita Kussmaul, MD;  Location: [place];  Service: General;  Laterality: Right;   INSERTION OF MESH Right 05/25/2018   Procedure: INSERTION OF MESH;  Surgeon: Jovita Kussmaul, MD;  Location: [place];  Service: General;  Laterality: Right;   IR RADIOLOGIST EVAL & MGMT  05/04/2022   KNEE ARTHROSCOPY W/ MENISCAL REPAIR Right 10/26/2005   '@[facility]'$   by Dr  Wynelle Link   LAPAROSCOPIC APPENDECTOMY  03/22/2011   '@[facility]'$    LAPAROSCOPY N/A 03/20/2022   Procedure: DIAGNOSTIC LAPAROSCOPY; ABDOMINAL [place] OUT AND DRAIN PLACEMENT; CREATION OF DIVERTING COLOSTOMY; ANAL EXAM UNDER ANESTHESIA;  Surgeon: Leighton Ruff, MD;  Location: [facility];  Service: General;  Laterality: N/A;   MOHS SURGERY  05/2021   left lower leg   RECTAL EXAM UNDER ANESTHESIA N/A 07/08/2022   Procedure: RECTAL EXAM UNDER ANESTHESIA;  Surgeon: Leighton Ruff, MD;  Location: [place];  Service: General;  Laterality: N/A;   TONSILLECTOMY  1958   TRANSANAL HEMORRHOIDAL DEARTERIALIZATION N/A 03/05/2022   Procedure: TRANSANAL HEMORRHOIDAL DEARTERIALIZATION;  Surgeon: Leighton Ruff, MD;  Location: [place];  Service: General;  Laterality: N/A;   Social History   Socioeconomic History    Marital status: Single    Spouse name: Not on file   Number of children: Not on file   Years of education: Not on file   Highest education level: Not on file  Occupational History   Occupation: Professor    Employer: [employer]  Tobacco Use   Smoking status: Former    Years: 20.00    Types: Cigarettes    Quit date: 1990    Years since quitting: 33.7   Smokeless tobacco: Never  Vaping Use   Vaping Use: Never used  Substance and Sexual Activity   Alcohol use: Yes    Alcohol/week: 14.0 standard drinks of alcohol    Types: 14 Standard drinks or equivalent per week    Comment: 2 ounces daily   Drug use: Never   Sexual activity: Yes    Partners: Male  Other Topics Concern   Not on file  Social History Narrative   Patient reports he is single 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  Male partners   Former smoker   No substance abuse   Alcohol 2/day   Social Determinants of Radio broadcast assistant Strain: Not on file  Food Insecurity: Not on file  Transportation Needs: Not on file  Physical Activity: Not on file  Stress: Not on file  Social Connections: Not on file  Intimate Partner Violence: Not on file    Current Outpatient Medications:    acetaminophen (TYLENOL) 500 MG tablet, Take 2 tablets (1,000 mg total) by mouth every 6 (six) hours as needed for mild pain., Disp: 30 tablet, Rfl: 0   albuterol (VENTOLIN HFA) 108 (90 Base) MCG/ACT inhaler, Inhale 2 puffs into the lungs every 6 (six) hours as needed for wheezing or shortness of breath., Disp: 8.5 g, Rfl: 0   amLODipine (NORVASC) 10 MG tablet, Take 10 mg by mouth daily. (Patient not taking: Reported on 07/12/2022), Disp: , Rfl:    azelastine (ASTELIN) 0.1 % nasal spray, Place 1 spray into both nostrils 2 (two) times daily as needed for rhinitis. Use in each nostril as directed, Disp: , Rfl:    bisoprolol (ZEBETA) 10 MG tablet, Take 1 tablet (10 mg total) by mouth at bedtime., Disp:  30 tablet, Rfl: 0   escitalopram (LEXAPRO) 10 MG tablet, Take 15 mg by mouth daily., Disp: , Rfl:    esomeprazole (NEXIUM) 20 MG capsule, Take 1 capsule (20 mg total) by mouth daily before breakfast., Disp: 30 capsule, Rfl: 0   ibuprofen (ADVIL) 400 MG tablet, Take 1-2 tablets (400-800 mg total) by mouth every 8 (eight) hours as needed for fever, mild pain or cramping., Disp: 30 tablet, Rfl: 0   loperamide (IMODIUM) 2 MG capsule, Take 1 capsule (2 mg total) by mouth with breakfast, with lunch, and with evening meal. (Patient not taking: Reported on 06/29/2022), Disp: 30 capsule, Rfl: 0   LORazepam (ATIVAN) 0.5  MG tablet, Take 1-2 tablets (0.5-1 mg total) by mouth every 12 (twelve) hours as needed for anxiety, sedation or sleep. (Patient not taking: Reported on 07/12/2022), Disp: 20 tablet, Rfl: 0   melatonin 3 MG TABS tablet, Take 1 tablet (3 mg total) by mouth at bedtime. (Patient taking differently: Take 3 mg by mouth at bedtime as needed.), Disp: 30 tablet, Rfl: 0   montelukast (SINGULAIR) 10 MG tablet, Take 1 tablet (10 mg total) by mouth at bedtime., Disp: 30 tablet, Rfl: 0   Multiple Vitamin (MULTIVITAMIN WITH MINERALS) TABS tablet, Take 1 tablet by mouth daily., Disp: , Rfl:    PSYLLIUM PO, Take 1 packet by mouth daily after lunch. States taking gummies, Disp: , Rfl:    tadalafil (CIALIS) 5 MG tablet, , Disp: , Rfl:    traMADol (ULTRAM) 50 MG tablet, Take 1-2 tablets (50-100 mg total) by mouth every 6 (six) hours as needed for moderate pain or severe pain. (Patient not taking: Reported on 07/12/2022), Disp: 30 tablet, Rfl: 0   valACYclovir (VALTREX) 500 MG tablet, Take 500 mg by mouth daily., Disp: , Rfl:  Allergies  Allergen Reactions   Fluconazole Hives, Rash and Other (See Comments)    Shingles activated    Griseofulvin Anaphylaxis, Swelling, Rash and Other (See Comments)    Throat swelling     Penicillins Rash and Other (See Comments)    High fever, tolerates Cefepime TOLERATING ZOSYN  [id number]   Sulfa Antibiotics Other (See Comments)    Joints ache, swell and caused inflammation    Oxycodone Nausea Only and Other (See Comments)    Nauseous to the point of almost vomiting   Mometasone Furo-Formoterol Fum Other (See Comments)    Lack of therapeutic effect    Retapamulin Rash   Review of Systems - Negative except as stated above    Physical Examination:   Gen: NAD CV: RRR Lungs: CTA Abd: soft Incision: Colostomy site healing well.    Assessment and Plan:   Tyler [name] is a 71 y.o. 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  This required diverting ostomy and drain placement.  He is healing appropriately from this.  There are no diagnoses linked to this encounter.  Patient with no leak on Gastrografin enema, flexible sigmoidoscopy or direct anoscopy.  I have recommended ostomy reversal.  We have discussed a 20% risk of hernia at the colostomy site.  Other risk include bleeding, need for larger surgery and leak of surgical connections.  All questions were answered.   The plan was discussed in detail with the patient today, who expressed understanding.  The patient has my contact information, and understands to call me with any additional questions or concerns in the interval.  I would be happy to see the patient back sooner if the need arises.   Rosario Adie, MD Colon and Rectal Surgery [facility]

## 2022-08-05 ENCOUNTER — Telehealth: Payer: Self-pay

## 2022-08-05 NOTE — Telephone Encounter (Signed)
Patient called to let you know he seen his dermatologist today.  He states Dr. Delman Cheadle feels like areas under his ostomy are infected. Patient reports the areas are red, itching and has some discharged from the areas.  It was recommended by Dr. Delman Cheadle that he uses mupirocin 3 times a day per patient.  Patient is asking if he should be on an oral antibiotics. Please advise Orma Cheetham T Brooks Sailors

## 2022-08-06 ENCOUNTER — Encounter: Payer: Self-pay | Admitting: Internal Medicine

## 2022-08-06 NOTE — Telephone Encounter (Signed)
Per Dr. Baxter Flattery she would like for the patient to be seen in the office. Patient requesting to see Dr. Baxter Flattery or [name]. Patient advised there was no availability with either provider today. Patient advised per [name] to reach out to his surgeon as well. Patient agreed and will contact surgeon today. We will contact patient if any cancellations with Dr. Baxter Flattery on Monday. [place] Ladye Macnaughton, CMA

## 2022-08-06 NOTE — Telephone Encounter (Signed)
Per Dr. Baxter Flattery she would like to do a video visit with the patient on Monday 08/09/22 and okay to double book. Patient aware of the appointment. Patient scheduled with Dr. Baxter Flattery and will upload pictures of the areas on his my chart. Lalah Durango T Brooks Sailors

## 2022-08-09 ENCOUNTER — Other Ambulatory Visit: Payer: Self-pay

## 2022-08-09 ENCOUNTER — Telehealth (INDEPENDENT_AMBULATORY_CARE_PROVIDER_SITE_OTHER): Payer: Medicare PPO | Admitting: Internal Medicine

## 2022-08-09 DIAGNOSIS — B369 Superficial mycosis, unspecified: Secondary | ICD-10-CM | POA: Diagnosis not present

## 2022-08-09 MED ORDER — CEPHALEXIN 500 MG PO CAPS: 500.0000 mg | ORAL_CAPSULE | Freq: Four times a day (QID) | ORAL | 0 refills | Status: DC

## 2022-08-09 NOTE — Progress Notes (Unsigned)
Virtual Visit via Video Note  I connected with Tyler [name] on 08/09/22 at 11:30 AM EDT by a video enabled telemedicine application and verified that I am speaking with the correct person using two identifiers.  Location: Patient: at home Provider: in clinic   I discussed the limitations of evaluation and management by telemedicine and the availability of in person appointments. The patient expressed understanding and agreed to proceed.  History of Present Illness: He reports that he went to his dermatologist for scalp lesions but also had him evaluate ostomy surrounding skin witch had some erythema. He was given  mupirocin tid since Thursday- which showed some improvement Observations/Objective: Patient showed some erythema about skin fold  Assessment and Plan: Possible fungal/cellulitis= continue with topical mupirocin but will also try antifungal cream to see if any improvement.  No systemic symptom - area of involvement suggests more likely candidiasis   Follow Up Instructions: Will follow up in 7-10 days to see progress   I discussed the assessment and treatment plan with the patient. The patient was provided an opportunity to ask questions and all were answered. The patient agreed with the plan and demonstrated an understanding of the instructions.   The patient was advised to call back or seek an in-person evaluation if the symptoms worsen or if the condition fails to improve as anticipated.  I provided 15 minutes of non-face-to-face time during this encounter.   Carlyle Basques, MD

## 2022-09-15 NOTE — Patient Instructions (Addendum)
SURGICAL WAITING ROOM VISITATION Patients having surgery or a procedure may have no more than 2 support people in the waiting area - these visitors may rotate.   Children under the age of 23 must have an adult with them who is not the patient. If the patient needs to stay at the hospital during part of their recovery, the visitor guidelines for inpatient rooms apply. Pre-op nurse will coordinate an appropriate time for 1 support person to accompany patient in pre-op.  This support person may not rotate.    Please refer to the [facility] website for the visitor guidelines for Inpatients (after your surgery is over and you are in a regular room).      Your procedure is scheduled on: 09-23-22   Report to [facility] Main Entrance    Report to admitting at 5:15 AM   Call this number if you have problems the morning of surgery [phone]   Follow a clear liquid diet the day before surgery   After Midnight you may have the following liquids until 4:30 AM DAY OF SURGERY  Water Non-Citrus Juices (without pulp, NO RED) Carbonated Beverages Black Coffee (NO MILK/CREAM OR CREAMERS, sugar ok)  Clear Tea (NO MILK/CREAM OR CREAMERS, sugar ok) regular and decaf                             Plain Jell-O (NO RED)                                           Fruit ices (not with fruit pulp, NO RED)                                     Popsicles (NO RED)                                                               Sports drinks like Gatorade (NO RED)  Drink 2 Pre-Surgery Ensure the evening before surgery (have completed by 10 PM)                   The day of surgery:  Drink ONE (1) Pre-Surgery Clear Ensure at 4:30 AM the morning of surgery. Drink in one sitting. Do not sip.  This drink was given to you during your hospital  pre-op appointment visit. Nothing else to drink after completing the Pre-Surgery Clear Ensure           If you have questions, please contact your surgeon's  office.   FOLLOW BOWEL PREP AND ANY ADDITIONAL PRE OP INSTRUCTIONS YOU RECEIVED FROM YOUR SURGEON'S OFFICE!!!   -Follow a clear liquid diet the day before surgery   Miralax 255 g -   Mix with 64 oz Gatorade/Powerade.  Drink gradually over the next few hours (8 oz glass every 15-30 minutes) until gone the day prior to surgery.    Bisacodyl (Dulcolax) 20 mg - Take with  water the day prior to surgery.    Oral Hygiene is also important to reduce your risk of infection.  Remember - BRUSH YOUR TEETH THE MORNING OF SURGERY WITH YOUR REGULAR TOOTHPASTE   Do NOT smoke after Midnight   Take these medicines the morning of surgery with A SIP OF WATER:   Amlodipine  Nexium  Valacyclovir  Albuterol inhaler  Tylenol if needed   DO NOT TAKE ANY ORAL DIABETIC MEDICATIONS DAY OF YOUR SURGERY  Bring CPAP mask and tubing day of surgery.                              You may not have any metal on your body including  jewelry, and body piercing             Do not wear lotions, powders, cologne, or deodorant              Men may shave face and neck.   Do not bring valuables to the hospital. [place].   Contacts, dentures or bridgework may not be worn into surgery.   Bring small overnight bag day of surgery.   DO NOT [place]. PHARMACY WILL DISPENSE MEDICATIONS LISTED ON YOUR MEDICATION LIST TO YOU DURING YOUR ADMISSION [place]!    Special Instructions: Bring a copy of your healthcare power of attorney and living will documents the day of surgery if you haven't scanned them before.              Please read over the following fact sheets you were given: IF [place]  If you received a COVID test during your pre-op visit  it is requested that you wear a mask when out in public, stay away from anyone that may not be  feeling well and notify your surgeon if you develop symptoms. If you test positive for Covid or have been in contact with anyone that has tested positive in the last 10 days please notify you surgeon.  [place] - Preparing for Surgery Before surgery, you can play an important role.  Because skin is not sterile, your skin needs to be as free of germs as possible.  You can reduce the number of germs on your skin by washing with CHG (chlorahexidine gluconate) soap before surgery.  CHG is an antiseptic cleaner which kills germs and bonds with the skin to continue killing germs even after washing. Please DO NOT use if you have an allergy to CHG or antibacterial soaps.  If your skin becomes reddened/irritated stop using the CHG and inform your nurse when you arrive at Short Stay. Do not shave (including legs and underarms) for at least 48 hours prior to the first CHG shower.  You may shave your face/neck.  Please follow these instructions carefully:  1.  Shower with CHG Soap the night before surgery and the  morning of surgery.  2.  If you choose to wash your hair, wash your hair first as usual with your normal  shampoo.  3.  After you shampoo, rinse your hair and body thoroughly to remove the shampoo.                             4.  Use CHG as you would any other liquid soap.  You can apply chg directly to the skin and wash.  Gently with a scrungie or clean  washcloth.  5.  Apply the CHG Soap to your body ONLY FROM THE NECK DOWN.   Do   not use on face/ open                           Wound or open sores. Avoid contact with eyes, ears mouth and   genitals (private parts).                       Wash face,  Genitals (private parts) with your normal soap.             6.  Wash thoroughly, paying special attention to the area where your    surgery  will be performed.  7.  Thoroughly rinse your body with warm water from the neck down.  8.  DO NOT shower/wash with your normal soap after using and rinsing off  the CHG Soap.                9.  Pat yourself dry with a clean towel.            10.  Wear clean pajamas.            11.  Place clean sheets on your bed the night of your first shower and do not  sleep with pets. Day of Surgery : Do not apply any lotions/deodorants the morning of surgery.  Please wear clean clothes to the hospital/surgery center.  FAILURE TO FOLLOW THESE INSTRUCTIONS MAY RESULT IN THE CANCELLATION OF YOUR SURGERY  PATIENT SIGNATURE_________________________________  NURSE SIGNATURE__________________________________  ________________________________________________________________________

## 2022-09-15 NOTE — Progress Notes (Signed)
COVID Vaccine Completed:  Yes  Date of COVID positive in last 90 days:  PCP - Cari Caraway, MD Cardiologist -   Chest x-ray - 04-09-22 Epic EKG - 03-05-22 Epic Stress Test -  ECHO -  Cardiac Cath -  Pacemaker/ICD device last checked: Spinal Cord Stimulator:  Bowel Prep -   Sleep Study -  CPAP -   Fasting Blood Sugar -  Checks Blood Sugar _____ times a day  Blood Thinner Instructions: Aspirin Instructions: Last Dose:  Activity level:  Can go up a flight of stairs and perform activities of daily living without stopping and without symptoms of chest pain or shortness of breath.  Able to exercise without symptoms  Unable to go up a flight of stairs without symptoms of     Anesthesia review:   Patient denies shortness of breath, fever, cough and chest pain at PAT appointment  Patient verbalized understanding of instructions that were given to them at the PAT appointment. Patient was also instructed that they will need to review over the PAT instructions again at home before surgery.

## 2022-09-16 ENCOUNTER — Other Ambulatory Visit: Payer: Self-pay

## 2022-09-16 ENCOUNTER — Encounter (HOSPITAL_COMMUNITY)
Admission: RE | Admit: 2022-09-16 | Discharge: 2022-09-16 | Disposition: A | Discharge status: Home / Self Care | Payer: Medicare PPO | Source: Ambulatory Visit | Attending: General Surgery | Admitting: General Surgery

## 2022-09-16 ENCOUNTER — Encounter (HOSPITAL_COMMUNITY): Payer: Self-pay

## 2022-09-16 VITALS — BP 143/92 | HR 65 | Temp 98.1°F | Resp 20 | Ht 69.0 in | Wt 176.2 lb

## 2022-09-16 DIAGNOSIS — Z01818 Encounter for other preprocedural examination: Secondary | ICD-10-CM | POA: Diagnosis present

## 2022-09-16 HISTORY — DX: Other nonthrombocytopenic purpura: D69.2

## 2022-09-16 HISTORY — DX: Anemia, unspecified: D64.9

## 2022-09-16 HISTORY — DX: Unspecified malignant neoplasm of skin, unspecified: C44.90

## 2022-09-16 HISTORY — DX: Pneumonia, unspecified organism: J18.9

## 2022-09-16 LAB — CBC
HCT: 41.2 % (ref 39.0–52.0)
Hemoglobin: 13.2 g/dL (ref 13.0–17.0)
MCH: 29.8 pg (ref 26.0–34.0)
MCHC: 32 g/dL (ref 30.0–36.0)
MCV: 93 fL (ref 80.0–100.0)
Platelets: 206 10*3/uL (ref 150–400)
RBC: 4.43 MIL/uL (ref 4.22–5.81)
RDW: 17.2 % — ABNORMAL HIGH (ref 11.5–15.5)
WBC: 6.7 10*3/uL (ref 4.0–10.5)
nRBC: 0 % (ref 0.0–0.2)

## 2022-09-16 LAB — COMPREHENSIVE METABOLIC PANEL
ALT: 21 U/L (ref 0–44)
AST: 26 U/L (ref 15–41)
Albumin: 4.1 g/dL (ref 3.5–5.0)
Alkaline Phosphatase: 92 U/L (ref 38–126)
Anion gap: 8 (ref 5–15)
BUN: 17 mg/dL (ref 8–23)
CO2: 25 mmol/L (ref 22–32)
Calcium: 9 mg/dL (ref 8.9–10.3)
Chloride: 107 mmol/L (ref 98–111)
Creatinine, Ser: 1.02 mg/dL (ref 0.61–1.24)
GFR, Estimated: >60 mL/min (ref >60)
Glucose, Bld: 84 mg/dL (ref 70–99)
Potassium: 4 mmol/L (ref 3.5–5.1)
Sodium: 140 mmol/L (ref 135–145)
Total Bilirubin: 0.9 mg/dL (ref 0.3–1.2)
Total Protein: 7.1 g/dL (ref 6.5–8.1)

## 2022-09-22 NOTE — Anesthesia Preprocedure Evaluation (Signed)
Anesthesia Evaluation  Patient identified by MRN, date of birth, ID band Patient awake    Reviewed: Allergy & Precautions, NPO status , Patient's Chart, lab work & pertinent test results  Airway Mallampati: I       Dental no notable dental hx. (+) Dental Advisory Given   Pulmonary asthma , pneumonia, resolved, former smoker   Pulmonary exam normal breath sounds clear to auscultation       Cardiovascular hypertension, Pt. on medications + Peripheral Vascular Disease  Normal cardiovascular exam Rhythm:Regular Rate:Normal  EKG 03/05/22 NSR, 1st degree AV Block   Neuro/Psych  PSYCHIATRIC DISORDERS Anxiety Depression    Borderline glaucoma    GI/Hepatic Neg liver ROS,GERD  ,,Diverting colostomy Hx/o intra abdominal abscess   Endo/Other  negative endocrine ROS    Renal/GU negative Renal ROS  negative genitourinary   Musculoskeletal  (+) Arthritis , Osteoarthritis,    Abdominal   Peds  Hematology  (+) Blood dyscrasia, anemia   Anesthesia Other Findings   Reproductive/Obstetrics ED                             Anesthesia Physical Anesthesia Plan  ASA: 3  Anesthesia Plan: General   Post-op Pain Management: Precedex, Ofirmev IV (intra-op)* and Dilaudid IV   Induction: Intravenous  PONV Risk Score and Plan: 4 or greater and Treatment may vary due to age or medical condition, Ondansetron and Dexamethasone  Airway Management Planned: Oral ETT  Additional Equipment: None  Intra-op Plan:   Post-operative Plan: Extubation in OR  Informed Consent: I have reviewed the patients History and Physical, chart, labs and discussed the procedure including the risks, benefits and alternatives for the proposed anesthesia with the patient or authorized representative who has indicated his/her understanding and acceptance.     Dental advisory given  Plan Discussed with: CRNA and  Anesthesiologist  Anesthesia Plan Comments:         Anesthesia Quick Evaluation

## 2022-09-23 ENCOUNTER — Other Ambulatory Visit: Payer: Self-pay

## 2022-09-23 ENCOUNTER — Inpatient Hospital Stay (HOSPITAL_COMMUNITY)
Admission: RE | Admit: 2022-09-23 | Discharge: 2022-09-28 | DRG: 331 | Disposition: A | Discharge status: Home / Self Care | Payer: Medicare PPO | Attending: General Surgery | Admitting: General Surgery

## 2022-09-23 ENCOUNTER — Inpatient Hospital Stay (HOSPITAL_COMMUNITY): Payer: Medicare PPO | Admitting: Anesthesiology

## 2022-09-23 ENCOUNTER — Encounter (HOSPITAL_COMMUNITY): Payer: Self-pay | Admitting: General Surgery

## 2022-09-23 ENCOUNTER — Encounter (HOSPITAL_COMMUNITY)
Admission: RE | Disposition: A | Discharge status: Home / Self Care | Payer: Self-pay | Source: Home / Self Care | Attending: General Surgery

## 2022-09-23 DIAGNOSIS — Z433 Encounter for attention to colostomy: Principal | ICD-10-CM

## 2022-09-23 DIAGNOSIS — Z800 Family history of malignant neoplasm of digestive organs: Secondary | ICD-10-CM | POA: Diagnosis not present

## 2022-09-23 DIAGNOSIS — Z888 Allergy status to other drugs, medicaments and biological substances status: Secondary | ICD-10-CM | POA: Diagnosis not present

## 2022-09-23 DIAGNOSIS — I10 Essential (primary) hypertension: Secondary | ICD-10-CM

## 2022-09-23 DIAGNOSIS — Z79899 Other long term (current) drug therapy: Secondary | ICD-10-CM

## 2022-09-23 DIAGNOSIS — Z87891 Personal history of nicotine dependence: Secondary | ICD-10-CM

## 2022-09-23 DIAGNOSIS — F418 Other specified anxiety disorders: Secondary | ICD-10-CM | POA: Diagnosis not present

## 2022-09-23 DIAGNOSIS — K435 Parastomal hernia without obstruction or  gangrene: Secondary | ICD-10-CM | POA: Diagnosis present

## 2022-09-23 DIAGNOSIS — Z85828 Personal history of other malignant neoplasm of skin: Secondary | ICD-10-CM

## 2022-09-23 DIAGNOSIS — Z880 Allergy status to penicillin: Secondary | ICD-10-CM | POA: Diagnosis not present

## 2022-09-23 DIAGNOSIS — Z933 Colostomy status: Principal | ICD-10-CM

## 2022-09-23 DIAGNOSIS — J45909 Unspecified asthma, uncomplicated: Secondary | ICD-10-CM

## 2022-09-23 DIAGNOSIS — Z885 Allergy status to narcotic agent status: Secondary | ICD-10-CM | POA: Diagnosis not present

## 2022-09-23 DIAGNOSIS — Z83511 Family history of glaucoma: Secondary | ICD-10-CM

## 2022-09-23 DIAGNOSIS — H40003 Preglaucoma, unspecified, bilateral: Secondary | ICD-10-CM | POA: Diagnosis present

## 2022-09-23 HISTORY — PX: COLON RESECTION: SHX5231

## 2022-09-23 LAB — TYPE AND SCREEN
ABO/RH(D): O POS
Antibody Screen: NEGATIVE

## 2022-09-23 SURGERY — COLON RESECTION
Anesthesia: General

## 2022-09-23 MED ORDER — SODIUM CHLORIDE 0.9 % IV SOLN
2.0000 g | INTRAVENOUS | Status: AC
  Administered 2022-09-23: 2 g via INTRAVENOUS
  Filled 2022-09-23: qty 2

## 2022-09-23 MED ORDER — SUGAMMADEX SODIUM 200 MG/2ML IV SOLN
INTRAVENOUS | Status: DC | PRN
  Administered 2022-09-23: 200 mg via INTRAVENOUS

## 2022-09-23 MED ORDER — BISACODYL 5 MG PO TBEC: 20.0000 mg | DELAYED_RELEASE_TABLET | Freq: Once | ORAL | Status: DC

## 2022-09-23 MED ORDER — ENSURE PRE-SURGERY PO LIQD
592.0000 mL | Freq: Once | ORAL | Status: DC
  Filled 2022-09-23: qty 592

## 2022-09-23 MED ORDER — EPHEDRINE SULFATE-NACL 50-0.9 MG/10ML-% IV SOSY
PREFILLED_SYRINGE | INTRAVENOUS | Status: DC | PRN
  Administered 2022-09-23: 10 mg via INTRAVENOUS

## 2022-09-23 MED ORDER — LIDOCAINE HCL (PF) 2 % IJ SOLN
INTRAMUSCULAR | Status: AC
  Filled 2022-09-23: qty 5

## 2022-09-23 MED ORDER — 0.9 % SODIUM CHLORIDE (POUR BTL) OPTIME
TOPICAL | Status: DC | PRN
  Administered 2022-09-23: 1000 mL

## 2022-09-23 MED ORDER — AMLODIPINE BESYLATE 5 MG PO TABS
5.0000 mg | ORAL_TABLET | Freq: Every day | ORAL | Status: DC
  Administered 2022-09-24 – 2022-09-28 (×5): 5 mg via ORAL
  Filled 2022-09-23 (×5): qty 1

## 2022-09-23 MED ORDER — LIDOCAINE 2% (20 MG/ML) 5 ML SYRINGE
INTRAMUSCULAR | Status: DC | PRN
  Administered 2022-09-23: 80 mg via INTRAVENOUS
  Administered 2022-09-23: 1.5 mg/kg/h via INTRAVENOUS

## 2022-09-23 MED ORDER — FENTANYL CITRATE (PF) 100 MCG/2ML IJ SOLN
INTRAMUSCULAR | Status: DC | PRN
  Administered 2022-09-23 (×4): 50 ug via INTRAVENOUS

## 2022-09-23 MED ORDER — ONDANSETRON HCL 4 MG/2ML IJ SOLN
INTRAMUSCULAR | Status: AC
  Filled 2022-09-23: qty 2

## 2022-09-23 MED ORDER — HYDROMORPHONE HCL 1 MG/ML IJ SOLN
INTRAMUSCULAR | Status: AC
  Administered 2022-09-23: 0.5 mg via INTRAVENOUS
  Filled 2022-09-23: qty 2

## 2022-09-23 MED ORDER — LORAZEPAM 0.5 MG PO TABS: 0.5000 mg | ORAL_TABLET | Freq: Two times a day (BID) | ORAL | Status: DC | PRN

## 2022-09-23 MED ORDER — DEXAMETHASONE SODIUM PHOSPHATE 10 MG/ML IJ SOLN
INTRAMUSCULAR | Status: DC | PRN
  Administered 2022-09-23: 8 mg via INTRAVENOUS

## 2022-09-23 MED ORDER — ENOXAPARIN SODIUM 40 MG/0.4ML IJ SOSY
40.0000 mg | PREFILLED_SYRINGE | INTRAMUSCULAR | Status: DC
  Administered 2022-09-24 – 2022-09-28 (×5): 40 mg via SUBCUTANEOUS
  Filled 2022-09-23 (×5): qty 0.4

## 2022-09-23 MED ORDER — POLYETHYLENE GLYCOL 3350 17 GM/SCOOP PO POWD
1.0000 | Freq: Once | ORAL | Status: DC
  Filled 2022-09-23: qty 255

## 2022-09-23 MED ORDER — HYDROMORPHONE HCL 1 MG/ML IJ SOLN
0.2500 mg | INTRAMUSCULAR | Status: DC | PRN
  Administered 2022-09-23 (×2): 0.5 mg via INTRAVENOUS

## 2022-09-23 MED ORDER — KCL IN DEXTROSE-NACL 20-5-0.45 MEQ/L-%-% IV SOLN
INTRAVENOUS | Status: DC
  Filled 2022-09-23: qty 1000

## 2022-09-23 MED ORDER — SIMETHICONE 80 MG PO CHEW: 40.0000 mg | CHEWABLE_TABLET | Freq: Four times a day (QID) | ORAL | Status: DC | PRN

## 2022-09-23 MED ORDER — MIDAZOLAM HCL 2 MG/2ML IJ SOLN
INTRAMUSCULAR | Status: AC
  Filled 2022-09-23: qty 2

## 2022-09-23 MED ORDER — PROPOFOL 10 MG/ML IV BOLUS
INTRAVENOUS | Status: AC
  Filled 2022-09-23: qty 20

## 2022-09-23 MED ORDER — ROCURONIUM BROMIDE 10 MG/ML (PF) SYRINGE
PREFILLED_SYRINGE | INTRAVENOUS | Status: DC | PRN
  Administered 2022-09-23: 70 mg via INTRAVENOUS

## 2022-09-23 MED ORDER — EPHEDRINE 5 MG/ML INJ
INTRAVENOUS | Status: AC
  Filled 2022-09-23: qty 5

## 2022-09-23 MED ORDER — ALVIMOPAN 12 MG PO CAPS
12.0000 mg | ORAL_CAPSULE | ORAL | Status: AC
  Administered 2022-09-23: 12 mg via ORAL
  Filled 2022-09-23: qty 1

## 2022-09-23 MED ORDER — MELATONIN 3 MG PO TABS: 3.0000 mg | ORAL_TABLET | Freq: Every evening | ORAL | Status: DC | PRN

## 2022-09-23 MED ORDER — ADULT MULTIVITAMIN W/MINERALS CH
1.0000 | ORAL_TABLET | Freq: Every day | ORAL | Status: DC
  Administered 2022-09-24 – 2022-09-28 (×5): 1 via ORAL
  Filled 2022-09-23 (×5): qty 1

## 2022-09-23 MED ORDER — MIDAZOLAM HCL 5 MG/5ML IJ SOLN
INTRAMUSCULAR | Status: DC | PRN
  Administered 2022-09-23: 2 mg via INTRAVENOUS

## 2022-09-23 MED ORDER — ROCURONIUM BROMIDE 10 MG/ML (PF) SYRINGE
PREFILLED_SYRINGE | INTRAVENOUS | Status: AC
  Filled 2022-09-23: qty 10

## 2022-09-23 MED ORDER — PANTOPRAZOLE SODIUM 40 MG PO TBEC
40.0000 mg | DELAYED_RELEASE_TABLET | Freq: Every day | ORAL | Status: DC
  Administered 2022-09-24 – 2022-09-28 (×5): 40 mg via ORAL
  Filled 2022-09-23 (×5): qty 1

## 2022-09-23 MED ORDER — SACCHAROMYCES BOULARDII 250 MG PO CAPS
250.0000 mg | ORAL_CAPSULE | Freq: Two times a day (BID) | ORAL | Status: DC
  Administered 2022-09-23 – 2022-09-28 (×11): 250 mg via ORAL
  Filled 2022-09-23 (×11): qty 1

## 2022-09-23 MED ORDER — ONDANSETRON HCL 4 MG/2ML IJ SOLN: 4.0000 mg | Freq: Four times a day (QID) | INTRAMUSCULAR | Status: DC | PRN

## 2022-09-23 MED ORDER — VALACYCLOVIR HCL 500 MG PO TABS
500.0000 mg | ORAL_TABLET | Freq: Every day | ORAL | Status: DC
  Administered 2022-09-24 – 2022-09-28 (×5): 500 mg via ORAL
  Filled 2022-09-23 (×5): qty 1

## 2022-09-23 MED ORDER — TRAMADOL HCL 50 MG PO TABS
50.0000 mg | ORAL_TABLET | Freq: Four times a day (QID) | ORAL | Status: DC | PRN
  Administered 2022-09-24: 50 mg via ORAL
  Administered 2022-09-24 – 2022-09-26 (×9): 100 mg via ORAL
  Administered 2022-09-27 – 2022-09-28 (×3): 50 mg via ORAL
  Filled 2022-09-23 (×4): qty 2
  Filled 2022-09-23 (×3): qty 1
  Filled 2022-09-23: qty 2
  Filled 2022-09-23: qty 1
  Filled 2022-09-23 (×5): qty 2

## 2022-09-23 MED ORDER — ORAL CARE MOUTH RINSE: 15.0000 mL | Freq: Once | OROMUCOSAL | Status: AC

## 2022-09-23 MED ORDER — GABAPENTIN 300 MG PO CAPS
300.0000 mg | ORAL_CAPSULE | ORAL | Status: AC
  Administered 2022-09-23: 300 mg via ORAL
  Filled 2022-09-23: qty 1

## 2022-09-23 MED ORDER — ALBUTEROL SULFATE (2.5 MG/3ML) 0.083% IN NEBU: 3.0000 mL | INHALATION_SOLUTION | Freq: Four times a day (QID) | RESPIRATORY_TRACT | Status: DC | PRN

## 2022-09-23 MED ORDER — FENTANYL CITRATE (PF) 100 MCG/2ML IJ SOLN
INTRAMUSCULAR | Status: AC
  Filled 2022-09-23: qty 2

## 2022-09-23 MED ORDER — HYDROMORPHONE HCL 1 MG/ML IJ SOLN
0.5000 mg | INTRAMUSCULAR | Status: DC | PRN
  Administered 2022-09-23: 0.5 mg via INTRAVENOUS

## 2022-09-23 MED ORDER — MONTELUKAST SODIUM 10 MG PO TABS
10.0000 mg | ORAL_TABLET | Freq: Every day | ORAL | Status: DC
  Administered 2022-09-23 – 2022-09-27 (×5): 10 mg via ORAL
  Filled 2022-09-23 (×5): qty 1

## 2022-09-23 MED ORDER — BUPIVACAINE LIPOSOME 1.3 % IJ SUSP
INTRAMUSCULAR | Status: AC
  Filled 2022-09-23: qty 20

## 2022-09-23 MED ORDER — ONDANSETRON HCL 4 MG PO TABS: 4.0000 mg | ORAL_TABLET | Freq: Four times a day (QID) | ORAL | Status: DC | PRN

## 2022-09-23 MED ORDER — ACETAMINOPHEN 500 MG PO TABS
1000.0000 mg | ORAL_TABLET | ORAL | Status: AC
  Administered 2022-09-23: 1000 mg via ORAL
  Filled 2022-09-23: qty 2

## 2022-09-23 MED ORDER — ALVIMOPAN 12 MG PO CAPS
12.0000 mg | ORAL_CAPSULE | Freq: Two times a day (BID) | ORAL | Status: DC
  Administered 2022-09-24 (×2): 12 mg via ORAL
  Filled 2022-09-23 (×2): qty 1

## 2022-09-23 MED ORDER — PHENYLEPHRINE 80 MCG/ML (10ML) SYRINGE FOR IV PUSH (FOR BLOOD PRESSURE SUPPORT)
PREFILLED_SYRINGE | INTRAVENOUS | Status: DC | PRN
  Administered 2022-09-23: 160 ug via INTRAVENOUS
  Administered 2022-09-23 (×2): 80 ug via INTRAVENOUS

## 2022-09-23 MED ORDER — LACTATED RINGERS IV SOLN: INTRAVENOUS | Status: DC

## 2022-09-23 MED ORDER — AZELASTINE HCL 0.1 % NA SOLN
1.0000 | Freq: Two times a day (BID) | NASAL | Status: DC | PRN
  Filled 2022-09-23: qty 30

## 2022-09-23 MED ORDER — BUPIVACAINE-EPINEPHRINE (PF) 0.25% -1:200000 IJ SOLN
INTRAMUSCULAR | Status: AC
  Filled 2022-09-23: qty 30

## 2022-09-23 MED ORDER — DEXAMETHASONE SODIUM PHOSPHATE 10 MG/ML IJ SOLN
INTRAMUSCULAR | Status: AC
  Filled 2022-09-23: qty 1

## 2022-09-23 MED ORDER — GABAPENTIN 300 MG PO CAPS
300.0000 mg | ORAL_CAPSULE | Freq: Two times a day (BID) | ORAL | Status: DC
  Administered 2022-09-23 – 2022-09-28 (×11): 300 mg via ORAL
  Filled 2022-09-23 (×11): qty 1

## 2022-09-23 MED ORDER — ACETAMINOPHEN 500 MG PO TABS
1000.0000 mg | ORAL_TABLET | Freq: Four times a day (QID) | ORAL | Status: DC
  Administered 2022-09-23 – 2022-09-28 (×19): 1000 mg via ORAL
  Filled 2022-09-23 (×19): qty 2

## 2022-09-23 MED ORDER — PROPOFOL 10 MG/ML IV BOLUS
INTRAVENOUS | Status: DC | PRN
  Administered 2022-09-23: 170 mg via INTRAVENOUS

## 2022-09-23 MED ORDER — CHLORHEXIDINE GLUCONATE 0.12 % MT SOLN
15.0000 mL | Freq: Once | OROMUCOSAL | Status: AC
  Administered 2022-09-23: 15 mL via OROMUCOSAL

## 2022-09-23 MED ORDER — LIDOCAINE HCL 2 % IJ SOLN
INTRAMUSCULAR | Status: AC
  Filled 2022-09-23: qty 20

## 2022-09-23 MED ORDER — ENSURE SURGERY PO LIQD: 237.0000 mL | Freq: Two times a day (BID) | ORAL | Status: DC

## 2022-09-23 MED ORDER — ONDANSETRON HCL 4 MG/2ML IJ SOLN: 4.0000 mg | Freq: Once | INTRAMUSCULAR | Status: DC | PRN

## 2022-09-23 MED ORDER — ALUM & MAG HYDROXIDE-SIMETH 200-200-20 MG/5ML PO SUSP: 30.0000 mL | Freq: Four times a day (QID) | ORAL | Status: DC | PRN

## 2022-09-23 MED ORDER — ONDANSETRON HCL 4 MG/2ML IJ SOLN
INTRAMUSCULAR | Status: DC | PRN
  Administered 2022-09-23: 4 mg via INTRAVENOUS

## 2022-09-23 MED ORDER — BISOPROLOL FUMARATE 5 MG PO TABS
10.0000 mg | ORAL_TABLET | Freq: Every day | ORAL | Status: DC
  Administered 2022-09-23 – 2022-09-27 (×5): 10 mg via ORAL
  Filled 2022-09-23 (×5): qty 2

## 2022-09-23 MED ORDER — TRAMADOL HCL 50 MG PO TABS
ORAL_TABLET | ORAL | Status: AC
  Administered 2022-09-23: 100 mg via ORAL
  Filled 2022-09-23: qty 2

## 2022-09-23 MED ORDER — BUPIVACAINE LIPOSOME 1.3 % IJ SUSP: 20.0000 mL | Freq: Once | INTRAMUSCULAR | Status: DC

## 2022-09-23 MED ORDER — ENSURE PRE-SURGERY PO LIQD
296.0000 mL | Freq: Once | ORAL | Status: DC
  Filled 2022-09-23: qty 296

## 2022-09-23 MED ORDER — BUPIVACAINE LIPOSOME 1.3 % IJ SUSP
INTRAMUSCULAR | Status: DC | PRN
  Administered 2022-09-23: 30 mL

## 2022-09-23 MED ORDER — PHENYLEPHRINE 80 MCG/ML (10ML) SYRINGE FOR IV PUSH (FOR BLOOD PRESSURE SUPPORT)
PREFILLED_SYRINGE | INTRAVENOUS | Status: AC
  Filled 2022-09-23: qty 10

## 2022-09-23 SURGICAL SUPPLY — 57 items
BAG COUNTER SPONGE SURGICOUNT (BAG) IMPLANT
BLADE EXTENDED COATED 6.5IN (ELECTRODE) IMPLANT
CELLS DAT CNTRL 66122 CELL SVR (MISCELLANEOUS) IMPLANT
CHLORAPREP W/TINT 26 (MISCELLANEOUS) ×1 IMPLANT
COUNTER NEEDLE 1200 MAGNETIC (NEEDLE) ×1 IMPLANT
COVER MAYO STAND STRL (DRAPES) ×3 IMPLANT
DERMABOND ADVANCED .7 DNX12 (GAUZE/BANDAGES/DRESSINGS) ×1 IMPLANT
DRAIN CHANNEL 19F RND (DRAIN) IMPLANT
DRAPE LAPAROSCOPIC ABDOMINAL (DRAPES) ×1 IMPLANT
DRSG OPSITE POSTOP 4X10 (GAUZE/BANDAGES/DRESSINGS) IMPLANT
DRSG OPSITE POSTOP 4X6 (GAUZE/BANDAGES/DRESSINGS) IMPLANT
DRSG OPSITE POSTOP 4X8 (GAUZE/BANDAGES/DRESSINGS) IMPLANT
DRSG TELFA 3X8 NADH STRL (GAUZE/BANDAGES/DRESSINGS) IMPLANT
ELECT REM PT RETURN 15FT ADLT (MISCELLANEOUS) ×1 IMPLANT
EVACUATOR SILICONE 100CC (DRAIN) IMPLANT
GAUZE SPONGE 4X4 12PLY STRL (GAUZE/BANDAGES/DRESSINGS) IMPLANT
GLOVE BIO SURGEON STRL SZ 6.5 (GLOVE) ×2 IMPLANT
GLOVE BIOGEL PI IND STRL 7.0 (GLOVE) ×2 IMPLANT
GLOVE INDICATOR 6.5 STRL GRN (GLOVE) ×1 IMPLANT
GOWN STRL REUS W/ TWL XL LVL3 (GOWN DISPOSABLE) ×6 IMPLANT
GOWN STRL REUS W/TWL XL LVL3 (GOWN DISPOSABLE) ×6
KIT TURNOVER KIT A (KITS) IMPLANT
LEGGING LITHOTOMY PAIR STRL (DRAPES) IMPLANT
PACK COLON (CUSTOM PROCEDURE TRAY) ×1 IMPLANT
PAD POSITIONING PINK XL (MISCELLANEOUS) ×1 IMPLANT
PENCIL SMOKE EVACUATOR (MISCELLANEOUS) IMPLANT
PROTECTOR NERVE ULNAR (MISCELLANEOUS) ×1 IMPLANT
RELOAD PROXIMATE 75MM BLUE (ENDOMECHANICALS) ×2 IMPLANT
RELOAD STAPLE 75 3.8 BLU REG (ENDOMECHANICALS) IMPLANT
RETRACTOR WND ALEXIS 18 MED (MISCELLANEOUS) IMPLANT
RTRCTR WOUND ALEXIS 18CM MED (MISCELLANEOUS)
SEALER TISSUE G2 STRG ARTC 35C (ENDOMECHANICALS) IMPLANT
SPIKE FLUID TRANSFER (MISCELLANEOUS) ×1 IMPLANT
STAPLER 90 3.5 STAND SLIM (STAPLE) ×1
STAPLER 90 3.5 STD SLIM (STAPLE) IMPLANT
STAPLER PROXIMATE 75MM BLUE (STAPLE) IMPLANT
STAPLER VISISTAT 35W (STAPLE) ×1 IMPLANT
SURGILUBE 2OZ TUBE FLIPTOP (MISCELLANEOUS) ×1 IMPLANT
SUT ETHILON 2 0 PS N (SUTURE) ×2 IMPLANT
SUT NOVA NAB DX-16 0-1 5-0 T12 (SUTURE) ×2 IMPLANT
SUT NOVA NAB GS-21 1 T12 (SUTURE) IMPLANT
SUT PDS AB 1 TP1 96 (SUTURE) IMPLANT
SUT PROLENE 2 0 KS (SUTURE) ×1 IMPLANT
SUT SILK 2 0 (SUTURE) ×2
SUT SILK 2 0 SH CR/8 (SUTURE) ×1 IMPLANT
SUT SILK 2-0 18XBRD TIE 12 (SUTURE) ×1 IMPLANT
SUT SILK 3 0 (SUTURE) ×2
SUT SILK 3 0 SH CR/8 (SUTURE) ×1 IMPLANT
SUT SILK 3-0 18XBRD TIE 12 (SUTURE) ×1 IMPLANT
SUT VIC AB 2-0 SH 18 (SUTURE) ×1 IMPLANT
SUT VIC AB 2-0 SH 27 (SUTURE) ×2
SUT VIC AB 2-0 SH 27X BRD (SUTURE) IMPLANT
SUT VIC AB 4-0 PS2 27 (SUTURE) ×1 IMPLANT
SUT VICRYL 0 UR6 27IN ABS (SUTURE) IMPLANT
TOWEL OR NON WOVEN STRL DISP B (DISPOSABLE) ×1 IMPLANT
TRAY FOLEY MTR SLVR 16FR STAT (SET/KITS/TRAYS/PACK) IMPLANT
TUBING CONNECTING 10 (TUBING) ×1 IMPLANT

## 2022-09-23 NOTE — Anesthesia Procedure Notes (Signed)
Procedure Name: Intubation Date/Time: 09/23/2022 7:32 AM  Performed by: Victoriano Lain, CRNAPre-anesthesia Checklist: Patient identified, Emergency Drugs available, Suction available, Timeout performed and Patient being monitored Patient Re-evaluated:Patient Re-evaluated prior to induction Oxygen Delivery Method: Circle system utilized Preoxygenation: Pre-oxygenation with 100% oxygen Induction Type: IV induction Ventilation: Mask ventilation without difficulty Laryngoscope Size: Mac and 4 Grade View: Grade I Tube type: Oral Tube size: 7.5 mm Number of attempts: 1 Airway Equipment and Method: Stylet Placement Confirmation: ETT inserted through vocal cords under direct vision, positive ETCO2 and breath sounds checked- equal and bilateral Secured at: 22 cm Tube secured with: Tape Dental Injury: Teeth and Oropharynx as per pre-operative assessment

## 2022-09-23 NOTE — Op Note (Signed)
09/23/2022  9:00 AM  PATIENT:  Tyler [name]  71 y.o. male  Patient Care Team: Cari Caraway, MD as PCP - General (Family Medicine) Leighton Ruff, MD as Consulting Physician (General Surgery) Gatha Mayer, MD as Consulting Physician (Gastroenterology)  PRE-OPERATIVE DIAGNOSIS:  COLOSTOMY DIVERTING  POST-OPERATIVE DIAGNOSIS:  COLOSTOMY DIVERTING  PROCEDURE:  Procedure(s): loop colostomy  SURGEON:  Surgeon(s): Ileana Roup, MD Leighton Ruff, MD  ASSISTANT: Dr Dema Severin   ANESTHESIA:   local and general  EBL:  Total I/O In: 100 [IV Piggyback:100] Out: 67 [Blood:50]  DRAINS: none   SPECIMEN:  Source of Specimen:  colostomy  DISPOSITION OF SPECIMEN:  PATHOLOGY  COUNTS:  YES  PLAN OF CARE: Admit to inpatient   PATIENT DISPOSITION:  PACU - hemodynamically stable.  INDICATION: 71 y.o. M s/p diverting colostomy.  Now ready for reversal   OR FINDINGS: colostomy, parastomal hernia (~6cm)  DESCRIPTION: the patient was identified in the preoperative holding area and taken to the OR where they were laid supine on the operating room table.  General anesthesia was induced without difficulty. SCDs were also noted to be in place prior to the initiation of anesthesia.  The patient was then prepped and draped in the usual sterile fashion.   A surgical timeout was performed indicating the correct patient, procedure, positioning and need for preoperative antibiotics.   I began by making an incision around the ostomy site using a 10 blade scalpel.  This was carried down through the subcutaneous tissues using electrocautery.  We separated the 2 ends of the colostomy from the subcutaneous tissue using blunt dissection and electrocautery.  The hernia sac was then disconnected from the fascial edge also using electrocautery.  Additional left lower quadrant adhesions were taken down from the abdominal wall.  Once this was complete the 2 ends of the colostomy were separated.  An  enterotomy was made in both ends of the colon.  A 75 mm GIA stapler was inserted and an anastomosis was created.  We then used a 90 mm TA stapler to close the common enterotomy site.  The mesentery was taken using 2-0 silk sutures over clamps.  The staple line was then imbricated using interrupted 3-0 silk sutures.  Additional hemostasis was used using electrocautery and 3-0 silk sutures.  In and tied tension suture was placed in the crotch of the anastomosis.  The anastomosis was patent to approximately 3 fingerbreadths.  This was placed back into the abdomen.  The abdomen was irrigated with saline.  Hemostasis was good.  The remaining hernia sac was removed and the fascia was mobilized.  The fascial edges were then closed using interrupted #1 Novafil sutures.  The subcutaneous tissue was then closed over this using 2, 2-0 Vicryl pursestring sutures.  A Telfa wick was placed in the middle of the wound.  A dressing was applied.   At this point, a digital rectal exam was performed including patency of the anal canal.  Patient had good rectal tone and no sign of injury.  Patient was then awakened from anesthesia and sent to the postanesthesia care unit in stable condition.  All counts were correct per operating room staff.  Rosario Adie, MD  [facility]

## 2022-09-23 NOTE — Anesthesia Postprocedure Evaluation (Signed)
Anesthesia Post Note  Patient: Tyler [name]  Procedure(s) Performed: loop colostomy     Patient location during evaluation: PACU Anesthesia Type: General Level of consciousness: awake and alert and oriented Pain management: pain level controlled Vital Signs Assessment: post-procedure vital signs reviewed and stable Respiratory status: spontaneous breathing, nonlabored ventilation and respiratory function stable Cardiovascular status: blood pressure returned to baseline and stable Postop Assessment: no apparent nausea or vomiting Anesthetic complications: no   No notable events documented.  Last Vitals:  Vitals:   09/23/22 0945 09/23/22 1000  BP: (!) 133/90 134/85  Pulse: 67 (!) 54  Resp: 15 12  Temp:    SpO2: 93% 96%    Last Pain:  Vitals:   09/23/22 1000  TempSrc:   PainSc: Asleep                 Angeligue Bowne A.

## 2022-09-23 NOTE — Transfer of Care (Signed)
Immediate Anesthesia Transfer of Care Note  Patient: Tyler [name]  Procedure(s) Performed: loop colostomy  Patient Location: PACU  Anesthesia Type:General  Level of Consciousness: awake, alert , oriented, and patient cooperative  Airway & Oxygen Therapy: Patient Spontanous Breathing and Patient connected to face mask oxygen  Post-op Assessment: Report given to RN, Post -op Vital signs reviewed and stable, and Patient moving all extremities  Post vital signs: Reviewed and stable  Last Vitals:  Vitals Value Taken Time  BP 150/83 09/23/22 0906  Temp    Pulse 74 09/23/22 0908  Resp 20 09/23/22 0908  SpO2 100 % 09/23/22 0908  Vitals shown include unvalidated device data.  Last Pain:  Vitals:   09/23/22 0556  TempSrc:   PainSc: 0-No pain         Complications: No notable events documented.

## 2022-09-23 NOTE — H&P (Signed)
Patient status post trans hemorrhoidal dearterialization with unfortunate complication of necrosis of the posterior anal canal.  He ended up undergoing a abdominal washout and diverting colostomy.  He is status post drain placement.  Most of his drains have been removed.  His pelvic surgical drain is out.  He came off his antibiotics and started developing fevers.  CT scan was performed did not show any obvious source of infection.  His antibiotics were restarted for 3 more weeks.  These are now off with no further fevers.  He is eating well and gaining weight.         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   ED (erectile dysfunction)     GERD (gastroesophageal reflux disease)     H/O pityriasis rosea     History of basal cell carcinoma (BCC) excision      2022 left calf s/p mohs   History of pertussis 2018   History of SCC (squamous cell carcinoma) of skin      removed from head   History of thrombocytopenia 12/03/2010   Hx of adenomatous polyp of colon 04/07/2017   Hypertension     Mild asthma      followed by pcp   OA (osteoarthritis)      knees, fingers   Prolapsed internal hemorrhoids, grade 3      hx  external hemorroid banding   Sepsis due to Enterococcus ([place]) 03/09/2022    followed by ID -- dr synder   Wears glasses           Family History  Problem Relation Age of Onset   AAA (abdominal aortic aneurysm) Mother     Glaucoma Sister     Stomach cancer Paternal Grandfather     Colon cancer Neg Hx            Past Surgical History:  Procedure Laterality Date   COLONOSCOPY WITH PROPOFOL   04/01/2017    by dr Carlean Purl   DIRECT LARYNGOSCOPY   1986    left vocal fold bx   (non-cancerous granuloma)   FINGER SURGERY Left 2017    ligament and tendon repair left index finger   FLEXIBLE SIGMOIDOSCOPY N/A 07/08/2022    Procedure: FLEXIBLE SIGMOIDOSCOPY;  Surgeon: Leighton Ruff, MD;  Location: [place];  Service: General;  Laterality: N/A;   HEMORRHOID SURGERY N/A 03/15/2022    Procedure: ANAL EXAM UNDER ANESTHESIA WITH PROCTOSCOPY  AND DEBRIDEMENT;  Surgeon: Leighton Ruff, MD;  Location: [facility];  Service: General;  Laterality: N/A;   INGUINAL HERNIA REPAIR Right 05/25/2018    Procedure: RIGHT INGUINAL HERNIA REPAIR WITH MESH;  Surgeon: Jovita Kussmaul, MD;  Location: [place];  Service: General;  Laterality: Right;   INSERTION OF MESH Right 05/25/2018    Procedure: INSERTION OF MESH;  Surgeon: Jovita Kussmaul, MD;  Location: [place];  Service: General;  Laterality: Right;   IR RADIOLOGIST EVAL & MGMT   05/04/2022  KNEE ARTHROSCOPY W/ MENISCAL REPAIR Right 10/26/2005    '@[facility]'$   by Dr  Wynelle Link   LAPAROSCOPIC APPENDECTOMY   03/22/2011    '@[facility]'$    LAPAROSCOPY N/A 03/20/2022    Procedure: DIAGNOSTIC LAPAROSCOPY; ABDOMINAL [place] OUT AND DRAIN PLACEMENT; CREATION OF DIVERTING COLOSTOMY; ANAL EXAM UNDER ANESTHESIA;  Surgeon: Leighton Ruff, MD;  Location: [facility];  Service: General;  Laterality: N/A;   MOHS SURGERY   05/2021    left lower leg   RECTAL EXAM UNDER ANESTHESIA N/A 07/08/2022    Procedure: RECTAL EXAM UNDER ANESTHESIA;  Surgeon: Leighton Ruff, MD;  Location: [place];  Service: General;  Laterality: N/A;   TONSILLECTOMY   1958   TRANSANAL HEMORRHOIDAL DEARTERIALIZATION N/A 03/05/2022    Procedure: TRANSANAL HEMORRHOIDAL DEARTERIALIZATION;  Surgeon: Leighton Ruff, MD;  Location: [place];  Service: General;  Laterality:  N/A;    Social History         Socioeconomic History   Marital status: Single      Spouse name: Not on file   Number of children: Not on file   Years of education: Not on file   Highest education level: Not on file  Occupational History   Occupation: Professor      Employer: [employer]  Tobacco Use   Smoking status: Former      Years: 20.00      Types: Cigarettes      Quit date: 1990      Years since quitting: 33.7   Smokeless tobacco: Never  Vaping Use   Vaping Use: Never used  Substance and Sexual Activity   Alcohol use: Yes      Alcohol/week: 14.0 standard drinks of alcohol      Types: 14 Standard drinks or equivalent per week      Comment: 2 ounces daily   Drug use: Never   Sexual activity: Yes      Partners: Male  Other Topics Concern   Not on file  Social History Narrative    Patient reports he is single 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  Male partners    Former smoker    No substance abuse    Alcohol 2/day    Social Determinants of Adult nurse Strain: Not on file  Food Insecurity: Not on file  Transportation Needs: Not on file  Physical Activity: Not on file  Stress: Not on file  Social Connections: Not on file  Intimate Partner Violence: Not on file      Current Outpatient Medications:    acetaminophen (TYLENOL) 500 MG tablet, Take 2 tablets (1,000 mg total) by mouth every 6 (six) hours as needed for mild pain., Disp: 30 tablet, Rfl: 0   albuterol (VENTOLIN HFA) 108 (90 Base) MCG/ACT inhaler, Inhale 2 puffs into the lungs every 6 (six) hours as needed for wheezing or shortness of breath., Disp: 8.5 g, Rfl: 0   amLODipine (NORVASC) 10 MG tablet, Take 10 mg by mouth daily. (Patient not taking: Reported on 07/12/2022), Disp: , Rfl:    azelastine (ASTELIN) 0.1 % nasal spray, Place 1 spray into both nostrils 2 (two) times daily as needed for rhinitis. Use in each nostril as directed, Disp: , Rfl:     bisoprolol (ZEBETA) 10 MG tablet, Take 1 tablet (10 mg total) by mouth at bedtime., Disp: 30 tablet, Rfl: 0   escitalopram (LEXAPRO) 10 MG tablet, Take 15 mg by mouth daily.,  Disp: , Rfl:    esomeprazole (NEXIUM) 20 MG capsule, Take 1 capsule (20 mg total) by mouth daily before breakfast., Disp: 30 capsule, Rfl: 0   ibuprofen (ADVIL) 400 MG tablet, Take 1-2 tablets (400-800 mg total) by mouth every 8 (eight) hours as needed for fever, mild pain or cramping., Disp: 30 tablet, Rfl: 0   loperamide (IMODIUM) 2 MG capsule, Take 1 capsule (2 mg total) by mouth with breakfast, with lunch, and with evening meal. (Patient not taking: Reported on 06/29/2022), Disp: 30 capsule, Rfl: 0   LORazepam (ATIVAN) 0.5 MG tablet, Take 1-2 tablets (0.5-1 mg total) by mouth every 12 (twelve) hours as needed for anxiety, sedation or sleep. (Patient not taking: Reported on 07/12/2022), Disp: 20 tablet, Rfl: 0   melatonin 3 MG TABS tablet, Take 1 tablet (3 mg total) by mouth at bedtime. (Patient taking differently: Take 3 mg by mouth at bedtime as needed.), Disp: 30 tablet, Rfl: 0   montelukast (SINGULAIR) 10 MG tablet, Take 1 tablet (10 mg total) by mouth at bedtime., Disp: 30 tablet, Rfl: 0   Multiple Vitamin (MULTIVITAMIN WITH MINERALS) TABS tablet, Take 1 tablet by mouth daily., Disp: , Rfl:    PSYLLIUM PO, Take 1 packet by mouth daily after lunch. States taking gummies, Disp: , Rfl:    tadalafil (CIALIS) 5 MG tablet, , Disp: , Rfl:    traMADol (ULTRAM) 50 MG tablet, Take 1-2 tablets (50-100 mg total) by mouth every 6 (six) hours as needed for moderate pain or severe pain. (Patient not taking: Reported on 07/12/2022), Disp: 30 tablet, Rfl: 0   valACYclovir (VALTREX) 500 MG tablet, Take 500 mg by mouth daily., Disp: , Rfl:       Allergies  Allergen Reactions   Fluconazole Hives, Rash and Other (See Comments)      Shingles activated     Griseofulvin Anaphylaxis, Swelling, Rash and Other (See Comments)      Throat  swelling       Penicillins Rash and Other (See Comments)      High fever, tolerates Cefepime TOLERATING ZOSYN [id number]   Sulfa Antibiotics Other (See Comments)      Joints ache, swell and caused inflammation     Oxycodone Nausea Only and Other (See Comments)      Nauseous to the point of almost vomiting   Mometasone Furo-Formoterol Fum Other (See Comments)      Lack of therapeutic effect     Retapamulin Rash    Review of Systems - Negative except as stated above     Physical Examination:    Vitals:   09/23/22 0544  BP: (!) 132/95  Pulse: 68  Resp: 20  Temp: 98.3 F (36.8 C)  SpO2: 97%    Gen: NAD CV: RRR Lungs: CTA Abd: soft Incision: Colostomy site healed, parastomal hernia       Assessment and Plan:    Tyler [name] is a 71 y.o. 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  This required diverting ostomy and drain placement.  He is healing appropriately from this.   There are no diagnoses linked to this encounter.   Patient with no leak on Gastrografin enema, flexible sigmoidoscopy or direct anoscopy.  I have recommended ostomy reversal.  We have discussed a 20% risk of hernia at the colostomy site.  Other risk include bleeding, need for larger surgery and leak of surgical connections.  All questions were answered.  The plan was discussed in detail with the patient today, who expressed understanding.  The patient has my contact information, and understands to call me with any additional questions or concerns in the interval.  I would be happy to see the patient back sooner if the need arises.    Rosario Adie, MD Colon and Rectal Surgery [facility]

## 2022-09-24 ENCOUNTER — Encounter (HOSPITAL_COMMUNITY): Payer: Self-pay | Admitting: General Surgery

## 2022-09-24 LAB — CBC
HCT: 36.8 % — ABNORMAL LOW (ref 39.0–52.0)
Hemoglobin: 11.8 g/dL — ABNORMAL LOW (ref 13.0–17.0)
MCH: 30.2 pg (ref 26.0–34.0)
MCHC: 32.1 g/dL (ref 30.0–36.0)
MCV: 94.1 fL (ref 80.0–100.0)
Platelets: 170 10*3/uL (ref 150–400)
RBC: 3.91 MIL/uL — ABNORMAL LOW (ref 4.22–5.81)
RDW: 17.2 % — ABNORMAL HIGH (ref 11.5–15.5)
WBC: 10.2 10*3/uL (ref 4.0–10.5)
nRBC: 0 % (ref 0.0–0.2)

## 2022-09-24 LAB — BASIC METABOLIC PANEL
Anion gap: 6 (ref 5–15)
BUN: 14 mg/dL (ref 8–23)
CO2: 24 mmol/L (ref 22–32)
Calcium: 8.6 mg/dL — ABNORMAL LOW (ref 8.9–10.3)
Chloride: 106 mmol/L (ref 98–111)
Creatinine, Ser: 1.02 mg/dL (ref 0.61–1.24)
GFR, Estimated: >60 mL/min (ref >60)
Glucose, Bld: 129 mg/dL — ABNORMAL HIGH (ref 70–99)
Potassium: 4 mmol/L (ref 3.5–5.1)
Sodium: 136 mmol/L (ref 135–145)

## 2022-09-24 LAB — SURGICAL PATHOLOGY

## 2022-09-24 MED ORDER — TRAMADOL HCL 50 MG PO TABS: 50.0000 mg | ORAL_TABLET | Freq: Four times a day (QID) | ORAL | 0 refills | Status: DC | PRN

## 2022-09-24 NOTE — Progress Notes (Signed)
Pt complaining of painful IV site and bleeding. Pt requested IV out. But refused to have a new IV in. Educated pt.

## 2022-09-24 NOTE — Progress Notes (Signed)
  Transition of Care [facility]) Screening Note   Patient Details  Name: Tyler [name] Date of Birth: 14-Aug-1951   Transition of [facility]) CM/SW Contact:    Lennart Pall, LCSW Phone Number: 09/24/2022, 3:58 PM    Transition of Care Department [facility]) has reviewed patient and no TOC needs have been identified at this time. We will continue to monitor patient advancement through interdisciplinary progression rounds. If new patient transition needs arise, please place a TOC consult.

## 2022-09-24 NOTE — Discharge Instructions (Signed)
ABDOMINAL SURGERY: POST OP INSTRUCTIONS  DIET: Follow a light bland diet the first 24 hours after arrival home, such as soup, liquids, crackers, etc.  Be sure to include lots of fluids daily.  Avoid fast food or heavy meals as your are more likely to get nauseated.  Do not eat any uncooked fruits or vegetables for the next 2 weeks as your colon heals. Take your usually prescribed home medications unless otherwise directed. PAIN CONTROL: Pain is best controlled by a usual combination of three different methods TOGETHER: Ice/Heat Over the counter pain medication Prescription pain medication Most patients will experience some swelling and bruising around the incisions.  Ice packs or heating pads (30-60 minutes up to 6 times a day) will help. Use ice for the first few days to help decrease swelling and bruising, then switch to heat to help relax tight/sore spots and speed recovery.  Some people prefer to use ice alone, heat alone, alternating between ice & heat.  Experiment to what works for you.  Swelling and bruising can take several weeks to resolve.   It is helpful to take an over-the-counter pain medication regularly for the first few weeks.  Choose one of the following that works best for you: Naproxen (Aleve, etc)  Two '220mg'$  tabs twice a day Ibuprofen (Advil, etc) Three '200mg'$  tabs four times a day (every meal & bedtime) Acetaminophen (Tylenol, etc) 500-'650mg'$  four times a day (every meal & bedtime) A  prescription for pain medication (such as oxycodone, hydrocodone, etc) should be given to you upon discharge.  Take your pain medication as prescribed.  If you are having problems/concerns with the prescription medicine (does not control pain, nausea, vomiting, rash, itching, etc), please call us [phone] to see if we need to switch you to a different pain medicine that will work better for you and/or control your side effect better. If you need a refill on your pain medication, please contact  your pharmacy.  They will contact our office to request authorization. Prescriptions will not be filled after 5 pm or on week-ends. Avoid getting constipated.  Between the surgery and the pain medications, it is common to experience some constipation.  Increasing fluid intake and taking a fiber supplement (such as Metamucil, Citrucel, FiberCon, MiraLax, etc) 1-2 times a day regularly will usually help prevent this problem from occurring.  A mild laxative (prune juice, Milk of Magnesia, MiraLax, etc) should be taken according to package directions if there are no bowel movements after 48 hours.   Watch out for diarrhea.  If you have many loose bowel movements, simplify your diet to bland foods & liquids for a few days.  Stop any stool softeners and decrease your fiber supplement.  Switching to mild anti-diarrheal medications (Kayopectate, Pepto Bismol) can help.  If this worsens or does not improve, please call us. Wash / shower every day.  You may shower over the incision / wound.  Avoid baths until the skin is fully healed.  Continue to shower over incision(s) after the dressing is off. Wash your wound with soap and water and then cover with a dry dressing daily.   ACTIVITIES as tolerated:   You may resume regular (light) daily activities beginning the next day--such as daily self-care, walking, climbing stairs--gradually increasing activities as tolerated.  If you can walk 30 minutes without difficulty, it is safe to try more intense activity such as jogging, treadmill, bicycling, low-impact aerobics, swimming, etc. Save the most intensive and strenuous activity for last  such as sit-ups, heavy lifting, contact sports, etc  Refrain from any heavy lifting or straining until you are off narcotics for pain control.   DO NOT PUSH THROUGH PAIN.  Let pain be your guide: If it hurts to do something, don't do it.  Pain is your body warning you to avoid that activity for another week until the pain goes down. You  may drive when you are no longer taking prescription pain medication, you can comfortably wear a seatbelt, and you can safely maneuver your car and apply brakes. You may have sexual intercourse when it is comfortable.  FOLLOW UP in our office Please call [facility] at [phone] to set up an appointment to see your surgeon in the office for a follow-up appointment approximately 1-2 weeks after your surgery. Make sure that you call for this appointment the day you arrive home to insure a convenient appointment time. 10. IF YOU HAVE DISABILITY OR FAMILY LEAVE FORMS, BRING THEM TO THE OFFICE FOR PROCESSING.  DO NOT GIVE THEM TO YOUR DOCTOR.   WHEN TO CALL us [phone]: Poor pain control Reactions / problems with new medications (rash/itching, nausea, etc)  Fever over 101.5 F (38.5 C) Inability to urinate Nausea and/or vomiting Worsening swelling or bruising Continued bleeding from incision. Increased pain, redness, or drainage from the incision  The clinic staff is available to answer your questions during regular business hours (8:30am-5pm).  Please don't hesitate to call and ask to speak to one of our nurses for clinical concerns.   A surgeon from [facility] is always on call at the hospitals   If you have a medical emergency, go to the nearest emergency room or call 911.    [facility], [place], [place], [place], [place]  [address] ? MAIN: [phone] ? TOLL FREE: [phone] ? FAX [phone] [email or web address]

## 2022-09-24 NOTE — Progress Notes (Signed)
1 Day Post-Op colostomy reversal Subjective: NO acute issues overnight, tolerating liquids  Objective: Vital signs in last 24 hours: Temp:  [97.5 F (36.4 C)-98 F (36.7 C)] 97.9 F (36.6 C) (11/10 0603) Pulse Rate:  [54-78] 62 (11/10 0603) Resp:  [12-20] 18 (11/10 0603) BP: (105-150)/(70-90) 113/74 (11/10 0603) SpO2:  [93 %-100 %] 99 % (11/10 0603)   Intake/Output from previous day: 11/09 0701 - 11/10 0700 In: 2344.8 [P.O.:600; I.V.:1644.8; IV Piggyback:100] Out: 850 [Urine:800; Blood:50] Intake/Output this shift: No intake/output data recorded.   General appearance: alert and cooperative GI: soft, nondistended  Incision: no significant drainage  Lab Results:  Recent Labs    09/24/22 0404  WBC 10.2  HGB 11.8*  HCT 36.8*  PLT 170   BMET Recent Labs    09/24/22 0404  NA 136  K 4.0  CL 106  CO2 24  GLUCOSE 129*  BUN 14  CREATININE 1.02  CALCIUM 8.6*   PT/INR No results for input(s): "LABPROT", "INR" in the last 72 hours. ABG No results for input(s): "PHART", "HCO3" in the last 72 hours.  Invalid input(s): "PCO2", "PO2"  MEDS, Scheduled  acetaminophen  1,000 mg Oral Q6H   alvimopan  12 mg Oral BID   amLODipine  5 mg Oral Daily   bisoprolol  10 mg Oral QHS   enoxaparin (LOVENOX) injection  40 mg Subcutaneous Q24H   feeding supplement  237 mL Oral BID BM   gabapentin  300 mg Oral BID   montelukast  10 mg Oral QHS   multivitamin with minerals  1 tablet Oral Daily   pantoprazole  40 mg Oral Daily   saccharomyces boulardii  250 mg Oral BID   valACYclovir  500 mg Oral Daily    Studies/Results: No results found.  Assessment: s/p Procedure(s): loop colostomy Patient Active Problem List   Diagnosis Date Noted   Colostomy status ([place]) 09/23/2022   PICC (peripherally inserted central catheter) in place 05/11/2022   Long term (current) use of antibiotics 05/10/2022   Candida albicans infection 04/19/2022   Leukocytosis    Pain in joint involving  pelvic region and thigh    Abscess, intra-abdominal, postoperative 04/12/2022   Sepsis due to Enterococcus ([place]) 04/12/2022   E coli infection 04/12/2022   Debility 04/09/2022   Hypokalemia 04/04/2022   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 04/03/2022   Protein-calorie malnutrition, severe ([place]) 04/03/2022   Anxiety associated with depression 04/03/2022   Intra-abdominal abscess ([place]) 04/03/2022   Thigh abscess 04/03/2022   Normal anion gap metabolic acidosis 14/43/1540   Acute blood loss anemia (ABLA) 03/18/2022   Acute urinary retention 03/12/2022   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   Mild persistent asthma 03/12/2022   Abdominal pain 03/09/2022   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/09/2022   Pruritus ani 04/20/2018   Arthritis of both knees 01/13/2018   Hx of adenomatous polyp of colon 04/07/2017   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/12/2013   History of tobacco use 11/12/2013   Low testosterone 09/19/2013   Sinusitis 12/21/2012   Herpes labialis 10/30/2012   Eczema 10/30/2012   Prolapsed internal hemorrhoids, grade 3, THD ligation 03/07/2022 10/30/2012   Hypertension 04/13/2011   FASCIITIS, PLANTAR 05/14/2010   NEOPLASM, SKIN, UNCERTAIN BEHAVIOR 08/67/6195   Chronic rhinitis 08/16/2008   Rash and other nonspecific skin eruption 07/02/2008   ABNORMAL COAGULATION PROFILE 05/29/2008  Gastroesophageal reflux disease 06/28/2007   PITYRIASIS ROSEA 06/28/2007   Osteoarthritis of knee 06/28/2007   Asthmatic bronchitis 05/11/2007    Expected post op course  Plan: d/c foley Advance diet as tolerated Ambulate PO pain meds   LOS: 1 day     .Rosario Adie, [facility], [place]    09/24/2022 8:37 AM

## 2022-09-25 NOTE — Progress Notes (Signed)
2 Days Post-Op   Subjective/Chief Complaint: Reports less distension this morning Passing flatus but has not had a true bm Pain controlled Feels a little weak try to ambulate   Objective: Vital signs in last 24 hours: Temp:  [97.9 F (36.6 C)-98.6 F (37 C)] 98.6 F (37 C) (11/11 0509) Pulse Rate:  [64-73] 73 (11/11 0509) Resp:  [16-20] 16 (11/11 0509) BP: (126-132)/(78-96) 129/78 (11/11 0509) SpO2:  [93 %-98 %] 93 % (11/11 0509) Weight:  [81.5 kg] 81.5 kg (11/11 0500) Last BM Date : 09/25/22  Intake/Output from previous day: 11/10 0701 - 11/11 0700 In: 360 [P.O.:360] Out: 300 [Urine:300] Intake/Output this shift: No intake/output data recorded.  Exam: Awake and alert Abdomen soft, non-distended, dressing dry with old drainage  Lab Results:  Recent Labs    09/24/22 0404  WBC 10.2  HGB 11.8*  HCT 36.8*  PLT 170   BMET Recent Labs    09/24/22 0404  NA 136  K 4.0  CL 106  CO2 24  GLUCOSE 129*  BUN 14  CREATININE 1.02  CALCIUM 8.6*   PT/INR No results for input(s): "LABPROT", "INR" in the last 72 hours. ABG No results for input(s): "PHART", "HCO3" in the last 72 hours.  Invalid input(s): "PCO2", "PO2"  Studies/Results: No results found.  Anti-infectives: Anti-infectives (From admission, onward)    Start     Dose/Rate Route Frequency Ordered Stop   09/24/22 1000  valACYclovir (VALTREX) tablet 500 mg        500 mg Oral Daily 09/23/22 1324     09/23/22 0600  cefoTEtan (CEFOTAN) 2 g in sodium chloride 0.9 % 100 mL IVPB        2 g 200 mL/hr over 30 Minutes Intravenous On call to O.R. 09/23/22 0532 09/23/22 0803       Assessment/Plan: S/p reversal of loop colostomy  Needs the return of bowel function prior to discharge Continue current diet Home in next 24 to 48 hours    LOS: 2 days    Coralie Keens 09/25/2022

## 2022-09-25 NOTE — Progress Notes (Signed)
Mobility Specialist - Progress Note   09/25/22 1203  Mobility  Activity Ambulated independently in hallway  Level of Assistance Independent  Assistive Device None  Distance Ambulated (ft) 500 ft  Activity Response Tolerated well  Mobility Referral Yes  $Mobility charge 1 Mobility   Pt received in bed and agreeable to mobility. Pt had some on pain in abdomen during mobility. Pt to bed after session with all needs met & nurse in room.     [facility]

## 2022-09-25 NOTE — Progress Notes (Signed)
Pharmacy Brief Note - Alvimopan (Entereg)  The standing order set for alvimopan (Entereg) now includes an automatic order to discontinue the drug after the patient has had a bowel movement. The change was approved by the [place] and the Medical Executive Committee.   This patient has had bowel movements documented by nursing. Therefore, alvimopan has been discontinued. If there are questions, please contact the pharmacy at [phone].   Thank you- Peggyann Juba, PharmD, [facility]: [phone]

## 2022-09-26 NOTE — Progress Notes (Signed)
3 Days Post-Op   Subjective/Chief Complaint: Having some liquid BM's but still feels a little bloated Tolerating po without nausea Ambulating with a walker   Objective: Vital signs in last 24 hours: Temp:  [97.7 F (36.5 C)-98.2 F (36.8 C)] 98.2 F (36.8 C) (11/12 0815) Pulse Rate:  [68-84] 68 (11/12 0815) Resp:  [16-18] 18 (11/12 0815) BP: (128-141)/(81-92) 128/92 (11/12 0815) SpO2:  [91 %-99 %] 95 % (11/12 0815) Weight:  [79.5 kg] 79.5 kg (11/12 0449) Last BM Date : 09/25/22  Intake/Output from previous day: 11/11 0701 - 11/12 0700 In: 700 [P.O.:700] Out: -  Intake/Output this shift: Total I/O In: 120 [P.O.:120] Out: -   Exam: Awake and alert Abdomen is still a little full but minimally tender Ostomy site with wick in place is stable  Lab Results:  Recent Labs    09/24/22 0404  WBC 10.2  HGB 11.8*  HCT 36.8*  PLT 170   BMET Recent Labs    09/24/22 0404  NA 136  K 4.0  CL 106  CO2 24  GLUCOSE 129*  BUN 14  CREATININE 1.02  CALCIUM 8.6*   PT/INR No results for input(s): "LABPROT", "INR" in the last 72 hours. ABG No results for input(s): "PHART", "HCO3" in the last 72 hours.  Invalid input(s): "PCO2", "PO2"  Studies/Results: No results found.  Anti-infectives: Anti-infectives (From admission, onward)    Start     Dose/Rate Route Frequency Ordered Stop   09/24/22 1000  valACYclovir (VALTREX) tablet 500 mg        500 mg Oral Daily 09/23/22 1324     09/23/22 0600  cefoTEtan (CEFOTAN) 2 g in sodium chloride 0.9 % 100 mL IVPB        2 g 200 mL/hr over 30 Minutes Intravenous On call to O.R. 09/23/22 0532 09/23/22 0803       Assessment/Plan: S/p takedown of loop colostomy  Given bloating, will keep until tomorrow Continuing current care   Coralie Keens 09/26/2022

## 2022-09-26 NOTE — Progress Notes (Signed)
Mobility Specialist - Progress Note   09/26/22 1354  Mobility  Activity Ambulated independently in hallway  Level of Assistance Independent  Assistive Device None  Distance Ambulated (ft) 500 ft  Activity Response Tolerated well  Mobility Referral Yes  $Mobility charge 1 Mobility   Pt received in bed and agreeable to mobility. No complaints during mobility. Pt to room standing after session with all needs met.      [facility]

## 2022-09-27 ENCOUNTER — Encounter: Payer: Self-pay | Admitting: Internal Medicine

## 2022-09-27 MED ORDER — POLYETHYLENE GLYCOL 3350 17 G PO PACK
17.0000 g | PACK | Freq: Two times a day (BID) | ORAL | Status: DC
  Administered 2022-09-27 – 2022-09-28 (×3): 17 g via ORAL
  Filled 2022-09-27 (×3): qty 1

## 2022-09-27 MED ORDER — FUROSEMIDE 40 MG PO TABS
40.0000 mg | ORAL_TABLET | Freq: Once | ORAL | Status: AC
  Administered 2022-09-27: 40 mg via ORAL
  Filled 2022-09-27 (×2): qty 1

## 2022-09-27 NOTE — Progress Notes (Signed)
4 Days Post-Op colostomy reversal Subjective: NO acute issues overnight, tolerating soft diet and having a little bowel function, no nausea, no fevers  Objective: Vital signs in last 24 hours: Temp:  [97.7 F (36.5 C)-97.9 F (36.6 C)] 97.9 F (36.6 C) (11/13 0606) Pulse Rate:  [62-67] 62 (11/13 0606) Resp:  [16] 16 (11/13 0606) BP: (129-141)/(86-90) 141/86 (11/13 0606) SpO2:  [94 %-98 %] 98 % (11/13 0606) Weight:  [78.9 kg] 78.9 kg (11/13 0611)   Intake/Output from previous day: 11/12 0701 - 11/13 0700 In: 850 [P.O.:850] Out: -  Intake/Output this shift: Total I/O In: 238 [P.O.:238] Out: -    General appearance: alert and cooperative GI: soft, mildly distended  Incision: no significant drainage, wick removed  Lab Results:  No results for input(s): "WBC", "HGB", "HCT", "PLT" in the last 72 hours.  BMET No results for input(s): "NA", "K", "CL", "CO2", "GLUCOSE", "BUN", "CREATININE", "CALCIUM" in the last 72 hours.  PT/INR No results for input(s): "LABPROT", "INR" in the last 72 hours. ABG No results for input(s): "PHART", "HCO3" in the last 72 hours.  Invalid input(s): "PCO2", "PO2"  MEDS, Scheduled  acetaminophen  1,000 mg Oral Q6H   amLODipine  5 mg Oral Daily   bisoprolol  10 mg Oral QHS   enoxaparin (LOVENOX) injection  40 mg Subcutaneous Q24H   feeding supplement  237 mL Oral BID BM   furosemide  40 mg Oral Once   gabapentin  300 mg Oral BID   montelukast  10 mg Oral QHS   multivitamin with minerals  1 tablet Oral Daily   pantoprazole  40 mg Oral Daily   polyethylene glycol  17 g Oral BID   saccharomyces boulardii  250 mg Oral BID   valACYclovir  500 mg Oral Daily    Studies/Results: No results found.  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 04/03/2022   Protein-calorie malnutrition, severe ([place]) 04/03/2022   Anxiety associated with depression 04/03/2022   Intra-abdominal abscess ([place]) 04/03/2022   Thigh abscess 04/03/2022   Normal anion gap metabolic acidosis 74/06/1447   Acute blood loss anemia (ABLA) 03/18/2022   Acute urinary retention 03/12/2022   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   Mild persistent asthma 03/12/2022   Abdominal pain 03/09/2022   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/09/2022   Pruritus ani 04/20/2018   Arthritis of both knees 01/13/2018   Hx of adenomatous polyp of colon 04/07/2017   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/12/2013   History of tobacco use 11/12/2013   Low testosterone 09/19/2013   Sinusitis 12/21/2012   Herpes labialis 10/30/2012   Eczema 10/30/2012   Prolapsed internal hemorrhoids, grade 3, THD ligation 03/07/2022 10/30/2012   Hypertension 04/13/2011   FASCIITIS, PLANTAR 05/14/2010   NEOPLASM, SKIN, UNCERTAIN BEHAVIOR 18/56/3149   Chronic rhinitis 08/16/2008   Rash and other nonspecific skin eruption  07/02/2008   ABNORMAL COAGULATION PROFILE 05/29/2008   Gastroesophageal reflux disease 06/28/2007   PITYRIASIS ROSEA 06/28/2007   Osteoarthritis of knee 06/28/2007   Asthmatic bronchitis 05/11/2007    Expected post op course  Plan: Cont soft diet Will give lasix to decrease edema Miralax to help with bowel function Ambulate PO pain meds as needed   LOS: 4 days     .Rosario Adie, [facility], [place]    09/27/2022 8:39 AM

## 2022-09-28 NOTE — Care Management Important Message (Signed)
Important Message  Patient Details  Name: Tyler [name] MRN: [id number] Date of Birth: 12-21-1950   Medicare Important Message Given:     IM MAIL TO PATIENT   Memory Argue 09/28/2022, 11:06 AM

## 2022-09-28 NOTE — Discharge Summary (Signed)
Physician Discharge Summary  Patient ID: Tyler [name] MRN: [id number] DOB/AGE: 69-Jan-1952 69 y.o.  Admit date: 09/23/2022 Discharge date: 09/28/2022  Admission Diagnoses: Colostomy present  Discharge Diagnoses:  Principal Problem:   Colostomy status [facility])   Discharged Condition: good  Hospital Course: Patient was admitted to the med surg floor after surgery.  Diet was advanced as tolerated.  Patient began to have a little bowel function on postop day 3, but became rather bloated.  He was given Miralax and a diurectic to help with edema, and had good bowel function after that.  By postop day 5, he was tolerating a solid diet and pain was controlled with oral medications.  He was urinating without difficulty and ambulating without assistance.  Patient was felt to be in stable condition for discharge to home.   Consults: None  Significant Diagnostic Studies: labs: cbc, bmet  Treatments: IV hydration, analgesia: acetaminophen and Tramadol, and surgery: colostomy reversal  Discharge Exam: Blood pressure (!) 149/90, pulse 66, temperature 98 F (36.7 C), temperature source Oral, resp. rate 18, height '5\' 9"'$  (1.753 m), weight 78.9 kg, SpO2 100 %. General appearance: alert and cooperative GI: soft, less distended Incision/Wound: clean, dry  Disposition: Discharge disposition: 01-Home or Self Care        Allergies as of 09/28/2022       Reactions   Fluconazole Hives, Rash, Other (See Comments)   Shingles activated   Griseofulvin Anaphylaxis, Swelling, Rash, Other (See Comments)   Throat swelling   Penicillins Rash, Other (See Comments)   High fever, tolerates Cefepime TOLERATING ZOSYN [id number]   Sulfa Antibiotics Other (See Comments)   Joints ache, swell and caused inflammation   Oxycodone Nausea Only, Other (See Comments)   Nauseous to the point of almost vomiting   Mometasone Furo-formoterol Fum Other (See Comments)   Lack of therapeutic effect   Retapamulin Rash         Medication List     TAKE these medications    acetaminophen 500 MG tablet Commonly known as: TYLENOL Take 2 tablets (1,000 mg total) by mouth every 6 (six) hours as needed for mild pain.   albuterol 108 (90 Base) MCG/ACT inhaler Commonly known as: VENTOLIN HFA Inhale 2 puffs into the lungs every 6 (six) hours as needed for wheezing or shortness of breath.   amLODipine 5 MG tablet Commonly known as: NORVASC Take 5 mg by mouth daily.   azelastine 0.1 % nasal spray Commonly known as: ASTELIN Place 1 spray into both nostrils 2 (two) times daily as needed for rhinitis. Use in each nostril as directed   bisoprolol 10 MG tablet Commonly known as: ZEBETA Take 1 tablet (10 mg total) by mouth at bedtime.   cephALEXin 500 MG capsule Commonly known as: Keflex Take 1 capsule (500 mg total) by mouth 4 (four) times daily.   esomeprazole 20 MG capsule Commonly known as: NEXIUM Take 1 capsule (20 mg total) by mouth daily before breakfast.   FIBER ADULT GUMMIES PO Take 3 tablets by mouth daily.   ibuprofen 400 MG tablet Commonly known as: ADVIL Take 1-2 tablets (400-800 mg total) by mouth every 8 (eight) hours as needed for fever, mild pain or cramping.   loperamide 2 MG capsule Commonly known as: IMODIUM Take 1 capsule (2 mg total) by mouth with breakfast, with lunch, and with evening meal.   LORazepam 0.5 MG tablet Commonly known as: ATIVAN Take 1-2 tablets (0.5-1 mg total) by mouth every 12 (twelve) hours as needed  for anxiety, sedation or sleep.   melatonin 3 MG Tabs tablet Take 1 tablet (3 mg total) by mouth at bedtime. What changed:  when to take this reasons to take this   montelukast 10 MG tablet Commonly known as: SINGULAIR Take 1 tablet (10 mg total) by mouth at bedtime.   multivitamin with minerals Tabs tablet Take 1 tablet by mouth daily.   traMADol 50 MG tablet Commonly known as: ULTRAM Take 1-2 tablets (50-100 mg total) by mouth every 6 (six) hours as  needed for moderate pain or severe pain.   valACYclovir 500 MG tablet Commonly known as: VALTREX Take 500 mg by mouth daily.        Follow-up Information     Leighton Ruff, MD. Schedule an appointment as soon as possible for a visit in 2 week(s).   Specialties: General Surgery, Colon and Rectal Surgery Contact information: [address] [place] [id number] [phone]                 Signed: Rosario Adie 82/99/3716, 9:16 AM

## 2022-12-31 ENCOUNTER — Other Ambulatory Visit: Payer: Self-pay | Admitting: General Surgery

## 2022-12-31 DIAGNOSIS — R1032 Left lower quadrant pain: Secondary | ICD-10-CM

## 2023-01-31 ENCOUNTER — Ambulatory Visit
Admission: RE | Admit: 2023-01-31 | Discharge: 2023-01-31 | Disposition: A | Discharge status: Home / Self Care | Payer: Medicare PPO | Source: Ambulatory Visit | Attending: General Surgery | Admitting: General Surgery

## 2023-01-31 DIAGNOSIS — R1032 Left lower quadrant pain: Secondary | ICD-10-CM

## 2023-02-08 ENCOUNTER — Ambulatory Visit: Payer: Self-pay | Admitting: General Surgery

## 2023-02-14 DIAGNOSIS — H40023 Open angle with borderline findings, high risk, bilateral: Secondary | ICD-10-CM | POA: Diagnosis not present

## 2023-02-25 ENCOUNTER — Encounter: Payer: Self-pay | Admitting: Internal Medicine

## 2023-02-28 ENCOUNTER — Encounter (HOSPITAL_BASED_OUTPATIENT_CLINIC_OR_DEPARTMENT_OTHER): Payer: Self-pay | Admitting: General Surgery

## 2023-02-28 ENCOUNTER — Other Ambulatory Visit: Payer: Self-pay

## 2023-03-02 ENCOUNTER — Encounter (HOSPITAL_BASED_OUTPATIENT_CLINIC_OR_DEPARTMENT_OTHER)
Admission: RE | Admit: 2023-03-02 | Discharge: 2023-03-02 | Disposition: A | Discharge status: Home / Self Care | Payer: Medicare PPO | Source: Ambulatory Visit | Attending: General Surgery | Admitting: General Surgery

## 2023-03-02 ENCOUNTER — Other Ambulatory Visit: Payer: Self-pay

## 2023-03-02 DIAGNOSIS — Z01810 Encounter for preprocedural cardiovascular examination: Secondary | ICD-10-CM | POA: Insufficient documentation

## 2023-03-07 ENCOUNTER — Other Ambulatory Visit: Payer: Self-pay

## 2023-03-07 ENCOUNTER — Encounter (HOSPITAL_BASED_OUTPATIENT_CLINIC_OR_DEPARTMENT_OTHER): Payer: Self-pay | Admitting: General Surgery

## 2023-03-07 ENCOUNTER — Ambulatory Visit (HOSPITAL_BASED_OUTPATIENT_CLINIC_OR_DEPARTMENT_OTHER): Payer: Medicare PPO | Admitting: Certified Registered"

## 2023-03-07 ENCOUNTER — Ambulatory Visit (HOSPITAL_BASED_OUTPATIENT_CLINIC_OR_DEPARTMENT_OTHER)
Admission: RE | Admit: 2023-03-07 | Discharge: 2023-03-07 | Disposition: A | Discharge status: Home / Self Care | Payer: Medicare PPO | Attending: General Surgery | Admitting: General Surgery

## 2023-03-07 ENCOUNTER — Encounter (HOSPITAL_BASED_OUTPATIENT_CLINIC_OR_DEPARTMENT_OTHER)
Admission: RE | Disposition: A | Discharge status: Home / Self Care | Payer: Self-pay | Source: Home / Self Care | Attending: General Surgery

## 2023-03-07 DIAGNOSIS — I739 Peripheral vascular disease, unspecified: Secondary | ICD-10-CM

## 2023-03-07 DIAGNOSIS — I10 Essential (primary) hypertension: Secondary | ICD-10-CM | POA: Diagnosis not present

## 2023-03-07 DIAGNOSIS — Z87891 Personal history of nicotine dependence: Secondary | ICD-10-CM | POA: Insufficient documentation

## 2023-03-07 DIAGNOSIS — K4090 Unilateral inguinal hernia, without obstruction or gangrene, not specified as recurrent: Secondary | ICD-10-CM | POA: Insufficient documentation

## 2023-03-07 DIAGNOSIS — K219 Gastro-esophageal reflux disease without esophagitis: Secondary | ICD-10-CM | POA: Diagnosis not present

## 2023-03-07 DIAGNOSIS — K429 Umbilical hernia without obstruction or gangrene: Secondary | ICD-10-CM | POA: Insufficient documentation

## 2023-03-07 HISTORY — PX: UMBILICAL HERNIA REPAIR: SHX196

## 2023-03-07 HISTORY — PX: INGUINAL HERNIA REPAIR: SHX194

## 2023-03-07 SURGERY — REPAIR, HERNIA, INGUINAL, ADULT
Anesthesia: General | Site: Inguinal

## 2023-03-07 MED ORDER — CHLORHEXIDINE GLUCONATE CLOTH 2 % EX PADS: 6.0000 | MEDICATED_PAD | Freq: Once | CUTANEOUS | Status: DC

## 2023-03-07 MED ORDER — BUPIVACAINE-EPINEPHRINE (PF) 0.25% -1:200000 IJ SOLN
INTRAMUSCULAR | Status: AC
  Filled 2023-03-07: qty 30

## 2023-03-07 MED ORDER — ONDANSETRON HCL 4 MG/2ML IJ SOLN
INTRAMUSCULAR | Status: AC
  Filled 2023-03-07: qty 2

## 2023-03-07 MED ORDER — HYDROMORPHONE HCL 1 MG/ML IJ SOLN
0.2500 mg | INTRAMUSCULAR | Status: DC | PRN
  Administered 2023-03-07 (×2): 0.5 mg via INTRAVENOUS

## 2023-03-07 MED ORDER — LIDOCAINE 2% (20 MG/ML) 5 ML SYRINGE
INTRAMUSCULAR | Status: AC
  Filled 2023-03-07: qty 10

## 2023-03-07 MED ORDER — EPHEDRINE SULFATE (PRESSORS) 50 MG/ML IJ SOLN
INTRAMUSCULAR | Status: DC | PRN
  Administered 2023-03-07: 5 mg via INTRAVENOUS
  Administered 2023-03-07: 10 mg via INTRAVENOUS

## 2023-03-07 MED ORDER — ACETAMINOPHEN 500 MG PO TABS
ORAL_TABLET | ORAL | Status: AC
  Filled 2023-03-07: qty 2

## 2023-03-07 MED ORDER — LACTATED RINGERS IV SOLN: INTRAVENOUS | Status: DC

## 2023-03-07 MED ORDER — FENTANYL CITRATE (PF) 100 MCG/2ML IJ SOLN
INTRAMUSCULAR | Status: AC
  Filled 2023-03-07: qty 2

## 2023-03-07 MED ORDER — AMISULPRIDE (ANTIEMETIC) 5 MG/2ML IV SOLN
10.0000 mg | Freq: Once | INTRAVENOUS | Status: AC | PRN
  Administered 2023-03-07: 10 mg via INTRAVENOUS

## 2023-03-07 MED ORDER — HYDROMORPHONE HCL 1 MG/ML IJ SOLN
INTRAMUSCULAR | Status: AC
  Filled 2023-03-07: qty 0.5

## 2023-03-07 MED ORDER — AMISULPRIDE (ANTIEMETIC) 5 MG/2ML IV SOLN
INTRAVENOUS | Status: AC
  Filled 2023-03-07: qty 4

## 2023-03-07 MED ORDER — TRAMADOL HCL 50 MG PO TABS
50.0000 mg | ORAL_TABLET | Freq: Four times a day (QID) | ORAL | 1 refills | Status: AC | PRN
Start: 2023-03-07 — End: 2024-03-06

## 2023-03-07 MED ORDER — PROPOFOL 10 MG/ML IV BOLUS
INTRAVENOUS | Status: DC | PRN
  Administered 2023-03-07: 170 mg via INTRAVENOUS

## 2023-03-07 MED ORDER — DEXAMETHASONE SODIUM PHOSPHATE 10 MG/ML IJ SOLN
INTRAMUSCULAR | Status: DC | PRN
  Administered 2023-03-07: 5 mg via INTRAVENOUS

## 2023-03-07 MED ORDER — MIDAZOLAM HCL 2 MG/2ML IJ SOLN
INTRAMUSCULAR | Status: AC
  Filled 2023-03-07: qty 2

## 2023-03-07 MED ORDER — VANCOMYCIN HCL IN DEXTROSE 1-5 GM/200ML-% IV SOLN
INTRAVENOUS | Status: AC
  Filled 2023-03-07: qty 200

## 2023-03-07 MED ORDER — GABAPENTIN 300 MG PO CAPS
ORAL_CAPSULE | ORAL | Status: AC
  Filled 2023-03-07: qty 1

## 2023-03-07 MED ORDER — EPHEDRINE 5 MG/ML INJ
INTRAVENOUS | Status: AC
  Filled 2023-03-07: qty 5

## 2023-03-07 MED ORDER — FENTANYL CITRATE (PF) 100 MCG/2ML IJ SOLN
INTRAMUSCULAR | Status: DC | PRN
  Administered 2023-03-07 (×4): 50 ug via INTRAVENOUS

## 2023-03-07 MED ORDER — LIDOCAINE 2% (20 MG/ML) 5 ML SYRINGE
INTRAMUSCULAR | Status: DC | PRN
  Administered 2023-03-07: 100 mg via INTRAVENOUS

## 2023-03-07 MED ORDER — GABAPENTIN 300 MG PO CAPS
300.0000 mg | ORAL_CAPSULE | ORAL | Status: AC
  Administered 2023-03-07: 300 mg via ORAL

## 2023-03-07 MED ORDER — ROCURONIUM BROMIDE 10 MG/ML (PF) SYRINGE
PREFILLED_SYRINGE | INTRAVENOUS | Status: AC
  Filled 2023-03-07: qty 10

## 2023-03-07 MED ORDER — PROPOFOL 10 MG/ML IV BOLUS
INTRAVENOUS | Status: AC
  Filled 2023-03-07: qty 20

## 2023-03-07 MED ORDER — MIDAZOLAM HCL 5 MG/5ML IJ SOLN
INTRAMUSCULAR | Status: DC | PRN
  Administered 2023-03-07: 1 mg via INTRAVENOUS

## 2023-03-07 MED ORDER — ACETAMINOPHEN 500 MG PO TABS: 1000.0000 mg | ORAL_TABLET | Freq: Once | ORAL | Status: DC

## 2023-03-07 MED ORDER — ACETAMINOPHEN 500 MG PO TABS
1000.0000 mg | ORAL_TABLET | ORAL | Status: AC
  Administered 2023-03-07: 1000 mg via ORAL

## 2023-03-07 MED ORDER — VANCOMYCIN HCL IN DEXTROSE 1-5 GM/200ML-% IV SOLN
1000.0000 mg | INTRAVENOUS | Status: AC
  Administered 2023-03-07: 1000 mg via INTRAVENOUS

## 2023-03-07 MED ORDER — ONDANSETRON HCL 4 MG/2ML IJ SOLN
INTRAMUSCULAR | Status: DC | PRN
  Administered 2023-03-07: 4 mg via INTRAVENOUS

## 2023-03-07 MED ORDER — SUGAMMADEX SODIUM 200 MG/2ML IV SOLN
INTRAVENOUS | Status: DC | PRN
  Administered 2023-03-07: 160 mg via INTRAVENOUS

## 2023-03-07 MED ORDER — BUPIVACAINE-EPINEPHRINE 0.25% -1:200000 IJ SOLN
INTRAMUSCULAR | Status: DC | PRN
  Administered 2023-03-07: 24 mL

## 2023-03-07 MED ORDER — ROCURONIUM BROMIDE 100 MG/10ML IV SOLN
INTRAVENOUS | Status: DC | PRN
  Administered 2023-03-07: 60 mg via INTRAVENOUS

## 2023-03-07 MED ORDER — DEXAMETHASONE SODIUM PHOSPHATE 10 MG/ML IJ SOLN
INTRAMUSCULAR | Status: AC
  Filled 2023-03-07: qty 1

## 2023-03-07 SURGICAL SUPPLY — 56 items
ADH SKN CLS APL DERMABOND .7 (GAUZE/BANDAGES/DRESSINGS) ×2
APL PRP STRL LF DISP 70% ISPRP (MISCELLANEOUS) ×2
APL SKNCLS STERI-STRIP NONHPOA (GAUZE/BANDAGES/DRESSINGS)
BENZOIN TINCTURE PRP APPL 2/3 (GAUZE/BANDAGES/DRESSINGS) IMPLANT
BLADE CLIPPER SURG (BLADE) IMPLANT
BLADE HEX COATED 2.75 (ELECTRODE) ×2 IMPLANT
BLADE SURG 15 STRL LF DISP TIS (BLADE) ×2 IMPLANT
BLADE SURG 15 STRL SS (BLADE) ×2
CHLORAPREP W/TINT 26 (MISCELLANEOUS) ×2 IMPLANT
COVER BACK TABLE 60X90IN (DRAPES) ×2 IMPLANT
COVER MAYO STAND STRL (DRAPES) ×2 IMPLANT
DERMABOND ADVANCED .7 DNX12 (GAUZE/BANDAGES/DRESSINGS) ×2 IMPLANT
DRAIN PENROSE .5X12 LATEX STL (DRAIN) ×2 IMPLANT
DRAPE LAPAROTOMY TRNSV 102X78 (DRAPES) ×2 IMPLANT
DRAPE UTILITY XL STRL (DRAPES) ×2 IMPLANT
DRSG TEGADERM 4X4.75 (GAUZE/BANDAGES/DRESSINGS) IMPLANT
ELECT COATED BLADE 2.86 ST (ELECTRODE) ×2 IMPLANT
ELECT REM PT RETURN 9FT ADLT (ELECTROSURGICAL) ×2
ELECTRODE REM PT RTRN 9FT ADLT (ELECTROSURGICAL) ×2 IMPLANT
GAUZE SPONGE 4X4 12PLY STRL LF (GAUZE/BANDAGES/DRESSINGS) IMPLANT
GLOVE BIO SURGEON STRL SZ7.5 (GLOVE) ×2 IMPLANT
GOWN STRL REUS W/ TWL LRG LVL3 (GOWN DISPOSABLE) ×4 IMPLANT
GOWN STRL REUS W/TWL LRG LVL3 (GOWN DISPOSABLE) ×4
MESH ULTRAPRO 3X6 7.6X15CM (Mesh General) IMPLANT
MESH VENTRALEX ST 2.5 CRC MED (Mesh General) IMPLANT
NDL HYPO 22X1.5 SAFETY MO (MISCELLANEOUS) IMPLANT
NDL HYPO 25X1 1.5 SAFETY (NEEDLE) ×2 IMPLANT
NEEDLE HYPO 22X1.5 SAFETY MO (MISCELLANEOUS) IMPLANT
NEEDLE HYPO 25X1 1.5 SAFETY (NEEDLE) ×2 IMPLANT
NS IRRIG 1000ML POUR BTL (IV SOLUTION) ×2 IMPLANT
PACK BASIN DAY SURGERY FS (CUSTOM PROCEDURE TRAY) ×2 IMPLANT
PENCIL SMOKE EVACUATOR (MISCELLANEOUS) ×2 IMPLANT
SLEEVE SCD COMPRESS KNEE MED (STOCKING) ×2 IMPLANT
SPIKE FLUID TRANSFER (MISCELLANEOUS) ×2 IMPLANT
SPONGE T-LAP 18X18 ~~LOC~~+RFID (SPONGE) ×2 IMPLANT
STRIP CLOSURE SKIN 1/2X4 (GAUZE/BANDAGES/DRESSINGS) IMPLANT
SUT MNCRL AB 4-0 PS2 18 (SUTURE) IMPLANT
SUT MON AB 4-0 PC3 18 (SUTURE) ×2 IMPLANT
SUT NOVA NAB DX-16 0-1 5-0 T12 (SUTURE) IMPLANT
SUT NOVA NAB GS-21 1 T12 (SUTURE) ×2 IMPLANT
SUT PROLENE 2 0 SH DA (SUTURE) ×4 IMPLANT
SUT SILK 2 0 SH (SUTURE) IMPLANT
SUT SILK 3 0 SH 30 (SUTURE) IMPLANT
SUT SILK 3 0 TIES 17X18 (SUTURE) ×2
SUT SILK 3-0 18XBRD TIE BLK (SUTURE) ×2 IMPLANT
SUT VIC AB 0 CT1 27 (SUTURE) ×2
SUT VIC AB 0 CT1 27XBRD ANBCTR (SUTURE) IMPLANT
SUT VIC AB 2-0 SH 27 (SUTURE) ×4
SUT VIC AB 2-0 SH 27XBRD (SUTURE) ×2 IMPLANT
SUT VIC AB 3-0 54X BRD REEL (SUTURE) IMPLANT
SUT VIC AB 3-0 BRD 54 (SUTURE)
SUT VIC AB 3-0 SH 27 (SUTURE) ×2
SUT VIC AB 3-0 SH 27X BRD (SUTURE) ×2 IMPLANT
SUT VICRYL 0 SH 27 (SUTURE) IMPLANT
SYR CONTROL 10ML LL (SYRINGE) ×2 IMPLANT
TOWEL GREEN STERILE FF (TOWEL DISPOSABLE) ×4 IMPLANT

## 2023-03-07 NOTE — Transfer of Care (Signed)
Immediate Anesthesia Transfer of Care Note  Patient: Tyler [name]  Procedure(s) Performed: LEFT INGUINAL HERNIA REPAIR WITH MESH (Left: Inguinal) UMBILICAL HERNIA REPAIR WITH MESH (Abdomen)  Patient Location: PACU  Anesthesia Type:General  Level of Consciousness: drowsy  Airway & Oxygen Therapy: Patient Spontanous Breathing and Patient connected to face mask oxygen  Post-op Assessment: Report given to RN and Post -op Vital signs reviewed and stable  Post vital signs: Reviewed and stable  Last Vitals:  Vitals Value Taken Time  BP 142/87 03/07/23 1528  Temp    Pulse 61 03/07/23 1530  Resp 16 03/07/23 1530  SpO2 98 % 03/07/23 1530  Vitals shown include unvalidated device data.  Last Pain:  Vitals:   03/07/23 1215  TempSrc: Oral  PainSc: 0-No pain      Patients Stated Pain Goal: 8 (03/07/23 1215)  Complications: No notable events documented.

## 2023-03-07 NOTE — Anesthesia Procedure Notes (Signed)
Procedure Name: Intubation Date/Time: 03/07/2023 1:19 PM  Performed by: Lauralyn Primes, CRNAPre-anesthesia Checklist: Patient identified, Emergency Drugs available, Suction available and Patient being monitored Patient Re-evaluated:Patient Re-evaluated prior to induction Oxygen Delivery Method: Circle system utilized Preoxygenation: Pre-oxygenation with 100% oxygen Induction Type: IV induction Ventilation: Mask ventilation without difficulty Laryngoscope Size: Mac and 4 Grade View: Grade I Tube type: Oral Tube size: 7.5 mm Number of attempts: 1 Airway Equipment and Method: Stylet and Bite block Placement Confirmation: ETT inserted through vocal cords under direct vision, positive ETCO2 and breath sounds checked- equal and bilateral Secured at: 23 cm Tube secured with: Tape Dental Injury: Teeth and Oropharynx as per pre-operative assessment

## 2023-03-07 NOTE — Discharge Instructions (Addendum)
No tylenol until 6:30 p.m.  Post Anesthesia Home Care Instructions  Activity: Get plenty of rest for the remainder of the day. A responsible individual must stay with you for 24 hours following the procedure.  For the next 24 hours, DO NOT: -Drive a car -Operate machinery -Drink alcoholic beverages -Take any medication unless instructed by your physician -Make any legal decisions or sign important papers.  Meals: Start with liquid foods such as gelatin or soup. Progress to regular foods as tolerated. Avoid greasy, spicy, heavy foods. If nausea and/or vomiting occur, drink only clear liquids until the nausea and/or vomiting subsides. Call your physician if vomiting continues.  Special Instructions/Symptoms: Your throat may feel dry or sore from the anesthesia or the breathing tube placed in your throat during surgery. If this causes discomfort, gargle with warm salt water. The discomfort should disappear within 24 hours.  If you had a scopolamine patch placed behind your ear for the management of post- operative nausea and/or vomiting:  1. The medication in the patch is effective for 72 hours, after which it should be removed.  Wrap patch in a tissue and discard in the trash. Wash hands thoroughly with soap and water. 2. You may remove the patch earlier than 72 hours if you experience unpleasant side effects which may include dry mouth, dizziness or visual disturbances. 3. Avoid touching the patch. Wash your hands with soap and water after contact with the patch.    

## 2023-03-07 NOTE — Op Note (Signed)
03/07/2023  3:16 PM  PATIENT:  Tyler [name]  72 y.o. male  PRE-OPERATIVE DIAGNOSIS:  LEFT INGUINAL AND UMBILICAL HERNIAS  POST-OPERATIVE DIAGNOSIS:  LEFT INGUINAL AND UMBILICAL HERNIAS  PROCEDURE:  Procedure(s): LEFT INGUINAL HERNIA REPAIR WITH MESH (Left) UMBILICAL HERNIA REPAIR WITH MESH (N/A)  SURGEON:  Surgeon(s) and Role:    * Griselda Miner, MD - Primary  PHYSICIAN ASSISTANT:   ASSISTANTS: none   ANESTHESIA:   local and general  EBL:  25 mL   BLOOD ADMINISTERED:none  DRAINS: none   LOCAL MEDICATIONS USED:  MARCAINE     SPECIMEN:  No Specimen  DISPOSITION OF SPECIMEN:  N/A  COUNTS:  YES  TOURNIQUET:  * No tourniquets in log *  DICTATION: .Dragon Dictation  After informed consent was obtained the patient was brought to the operating room and placed in the supine position on the operating room table.  After adequate induction of general anesthesia the patient's abdomen and left groin were prepped with ChloraPrep, allowed to dry, and  draped in usual sterile manner.  An appropriate timeout was performed.  Attention was first turned to the umbilicus.  The area around the umbilicus was infiltrated with quarter percent Marcaine.  There was a previous incision at the lower edge of the umbilicus.  I made a small vertically oriented incision with a 15 blade knife through this previous incision.  The incision was carried through the skin and subcutaneous tissue sharply with the electrocautery until the hernia sac was identified.  The hernia sac was also opened sharply with the electrocautery.  The hernia defect was examined.  The hernia defect was approximately 2-1/2 cm.  There was nothing within the hernia sac itself.  The hernia sac was excised sharply with the electrocautery.  The fascial edges were identified and appeared healthy.  I chose a 6.4 cm medium size piece of umbilical hernia mesh.  The mesh was oriented with the coated side down towards the bowel.  The mesh was  placed through the fascial defect into the abdominal cavity.  The anchors were used to pull the mesh up against the abdominal wall and there appeared to be good apposition of the mesh to the abdominal wall.  The fascial defect was then closed with interrupted #1 Novafil stitches incorporating the anchors of the mesh.  Once this was accomplished the mesh appeared to be in good position and the hernia seem well repaired.  The wound was irrigated with copious amounts of saline.  The subcutaneous tissue was closed with interrupted 2-0 Vicryl stitches.  The skin was then closed with interrupted 4-0 Monocryl subcuticular stitches.  Attention was then turned to the left groin.  The left groin was infiltrated with quarter percent Marcaine.  A small incision was made from the edge of the pubic tubercle towards the anterior superior iliac spine with a 15 blade knife.  The incision was carried through the skin and subcutaneous tissue sharply with the electrocautery until the dissection reached the external oblique fascia.  A small bridging vein was clamped with hemostats, divided, and ligated with 3-0 silk ties.  The external oblique fascia was opened along its fibers towards the apex of the external ring with a 15 blade knife and Metzenbaum scissors.  A Wheat[place] retractor was deployed.  Blunt dissection was carried out of the cord structures until they could be surrounded between 2 fingers.  1/2 inch Penrose drain was placed around the structures for retraction purposes.  The cord was then gently  skeletonized by blunt hemostat dissection.  There appeared to be a lot of scar tissue involving the cord and the cremaster muscle.  I was able to identify the vas deferens and testicular vessels and care was taken to avoid any injury to the structures.  I was able to to then identify a hernia sac which seemed somewhat abnormal.  There appeared to be some band with the sac that created almost a horseshoe formation.  Rather than risk  any potential injury to an intra-abdominal structure I elected to reduce the hernia sac back beneath the transversalis muscle and this was done without difficulty.  The transversalis layer was then repaired with interrupted 0 Vicryl stitches.  Next a 3 x 6 piece of ultra Pro mesh was chosen and cut to the appropriate size.  The mesh was sewed inferiorly to the shelving edge of the inguinal ligament with a running 2-0 Prolene stitch.  Tails were cut in the mesh laterally and the tails were wrapped around the cord structures.  Medially and superiorly the mesh was sewed to the muscular aponeurotic strength layer of the transversalis with interrupted 2-0 Prolene vertical mattress stitches.  Lateral to the cord the tails of the mesh were anchored to the shelving edge of the inguinal ligament with interrupted 2-0 Prolene stitches.  Once this was accomplished the mesh appeared to be in good position and the hernia seem well repaired.  During the dissection the ilioinguinal nerve was identified and was involved with the same scar tissue.  It was dissected out proximally and distally, clamped, divided, and ligated with 3-0 silk ties.  The wound was then irrigated with copious amounts of saline.  The external bleak fascia was reapproximated with a running 2-0 Vicryl stitch.  The subcutaneous fascia was closed with a running 3-0 Vicryl stitch.  The skin was then closed with a running 4-0 Monocryl subcuticular stitch.  Dermabond dressings were applied to both incisions.  The patient tolerated the procedure well.  At the end of the case all needle sponge and instrument counts were correct.  The patient was then awakened and taken to recovery in stable condition.  PLAN OF CARE: Discharge to home after PACU  PATIENT DISPOSITION:  PACU - hemodynamically stable.   Delay start of Pharmacological VTE agent (>24hrs) due to surgical blood loss or risk of bleeding: not applicable

## 2023-03-07 NOTE — Anesthesia Preprocedure Evaluation (Addendum)
Anesthesia Evaluation  Patient identified by MRN, date of birth, ID band Patient awake    Reviewed: Allergy & Precautions, NPO status , Patient's Chart, lab work & pertinent test results, reviewed documented beta blocker date and time   History of Anesthesia Complications Negative for: history of anesthetic complications  Airway Mallampati: II  TM Distance: >3 FB Neck ROM: Full    Dental no notable dental hx.    Pulmonary asthma , former smoker   Pulmonary exam normal        Cardiovascular hypertension, Pt. on medications and Pt. on home beta blockers + Peripheral Vascular Disease  Normal cardiovascular exam     Neuro/Psych   Anxiety Depression    negative neurological ROS     GI/Hepatic Neg liver ROS,GERD  Medicated and Controlled,,LEFT INGUINAL AND UMBILICAL HERNIAS, S/P colostomy   Endo/Other  negative endocrine ROS    Renal/GU negative Renal ROS     Musculoskeletal  (+) Arthritis ,    Abdominal   Peds  Hematology negative hematology ROS (+)   Anesthesia Other Findings Day of surgery medications reviewed with patient.  Reproductive/Obstetrics                              Anesthesia Physical Anesthesia Plan  ASA: 2  Anesthesia Plan: General   Post-op Pain Management: Tylenol PO (pre-op)*   Induction: Intravenous  PONV Risk Score and Plan: 2 and Ondansetron, Dexamethasone, Treatment may vary due to age or medical condition and Midazolam  Airway Management Planned: Oral ETT  Additional Equipment: None  Intra-op Plan:   Post-operative Plan: Extubation in OR  Informed Consent: I have reviewed the patients History and Physical, chart, labs and discussed the procedure including the risks, benefits and alternatives for the proposed anesthesia with the patient or authorized representative who has indicated his/her understanding and acceptance.     Dental advisory  given  Plan Discussed with: CRNA  Anesthesia Plan Comments:          Anesthesia Quick Evaluation

## 2023-03-07 NOTE — Interval H&P Note (Signed)
History and Physical Interval Note:  03/07/2023 12:48 PM  Tyler [name]  has presented today for surgery, with the diagnosis of LEFT INGUINAL AND UMBILICAL HERNIAS.  The various methods of treatment have been discussed with the patient and family. After consideration of risks, benefits and other options for treatment, the patient has consented to  Procedure(s): LEFT INGUINAL HERNIA REPAIR WITH MESH (Left) UMBILICAL HERNIA REPAIR WITH MESH (N/A) as a surgical intervention.  The patient's history has been reviewed, patient examined, no change in status, stable for surgery.  I have reviewed the patient's chart and labs.  Questions were answered to the patient's satisfaction.     Chevis Pretty III

## 2023-03-07 NOTE — H&P (Addendum)
PROVIDER: Lindell Noe, MD  MRN: [id number] DOB: [birth year] Subjective   Chief Complaint: New Problem (Possible Hernia - c/o Bulge x 4 weeks)   History of Present Illness: Tyler [name] is a 72 y.o. male who is seen today for left groin pain. The patient is a 72 year old white male who is an old patient of mine who had a previous right inguinal hernia repair with mesh performed. More recently he apparently developed an abscess after hemorrhoid surgery which required colonic diversion and drainage and subsequent colostomy reversal. He is about 3 months out from the colostomy reversal. Over the last month or so he has been experiencing some left groin pain as well as some discomfort around the umbilicus. He denies any nausea or vomiting. He denies any fevers or chills. He is still recovering from the several surgeries he had recently but is feeling better all the time.    Review of Systems: A complete review of systems was obtained from the patient. I have reviewed this information and discussed as appropriate with the patient. See HPI as well for other ROS.  ROS   Medical History: Past Medical History:  Diagnosis Date  Arthritis  Asthma without status asthmaticus  GERD (gastroesophageal reflux disease)  Hypertension   Patient Active Problem List  Diagnosis  Other diseases of vocal cords  Asthma  Chronic rhinitis  Wheezing  Exercise induced bronchospasm  History of tobacco use  Noise-induced hearing loss of left ear  Hyperacusis of left ear  History of pertussis  Left groin pain   Past Surgical History:  Procedure Laterality Date  TRANSANAL HEMORRHOIDAL DEARTERIALIZATION 03/05/2022  Dr. Clovis Pu  ANAL EXAM UNDER ANESTHESIA WITH PROCTOSCOPY AND DEBRIDEMENT 03/15/2022  Dr. Clovis Pu  DIAGNOSTIC LAPAROSCOPY; ABDOMINAL WASH OUT AND DRAIN PLACEMENT; CREATION OF DIVERTING COLOSTOMY; ANAL EXAM UNDER ANESTHESIA 03/20/2022  Dr. Clovis Pu  RECTAL EXAM UNDER ANESTHESIA,  FLEXIBLE SIGMOIDOSCOPY 07/08/2022  Dr. Clovis Pu  APPENDECTOMY  arythenoid cartilage biopsy  KNEE ARTHROSCOPY  LIGAMENT RECONSTRUCTION & TENDON INTERPOSITION HAND Left  TONSILLECTOMY    Allergies  Allergen Reactions  Griseofulvin (Bulk) Anaphylaxis and Rash  Throat swelling  Penicillins Other (See Comments)  Other Reaction: Fever  Sulfa (Sulfonamide Antibiotics) Other (See Comments)  REACTION: joints ache Other Reaction: inflammation  Oxycodone Nausea  Nauseous to the point of almost vomiting  Diflucan [Fluconazole] Hives and Other (See Comments)  Shingles activated  Mometasone-Formoterol Other (See Comments)  Lack of therapeutic effect  Retapamulin Rash   Current Outpatient Medications on File Prior to Visit  Medication Sig Dispense Refill  albuterol (PROVENTIL HFA,VENTOLIN HFA) 90 mcg/actuation inhaler  amLODIPine (NORVASC) 5 MG tablet Take 5 mg by mouth once daily.  azelastine (ASTELIN) 137 mcg nasal spray 2 spray in each nostril 2 times a day  bisacodyL (DULCOLAX) 5 mg EC tablet Take 4 tablets (20 mg total) by mouth once daily as needed for Constipation for up to 1 dose 4 tablet 0  bisoprolol (ZEBETA) 10 MG tablet TK 1 T PO QD 1  hydrocortisone (ANUSOL-HC) 25 mg suppository Place 1 suppository (25 mg total) rectally 2 (two) times daily as needed for Hemorrhoids Do not use for more than 7 days in a row 20 suppository 1  hydrocortisone-pramoxine (ANALPRAM-HC) 2.5-1 % rectal cream  montelukast (SINGULAIR) 10 mg tablet 1 tab by mouth at bedtime  NEXIUM 20 mg DR capsule Take 20 mg by mouth once daily. 12  nystatin (MYCOSTATIN) 100,000 unit/gram powder Apply topically 2 (two) times daily 30 g 1  tadalafil (CIALIS) 5 MG tablet Take 5 mg by mouth as needed for Erectile Dysfunction.  valacyclovir (VALTREX) 1000 MG tablet   No current facility-administered medications on file prior to visit.   Family History  Problem Relation Age of Onset  Aneurysm Mother    Social History    Tobacco Use  Smoking Status Former  Packs/day: 0.25  Years: 15.00  Additional pack years: 0.00  Total pack years: 3.75  Types: Cigarettes  Quit date: 11/15/1988  Years since quitting: 34.1  Smokeless Tobacco Never    Social History   Socioeconomic History  Marital status: Single  Tobacco Use  Smoking status: Former  Packs/day: 0.25  Years: 15.00  Additional pack years: 0.00  Total pack years: 3.75  Types: Cigarettes  Quit date: 11/15/1988  Years since quitting: 34.1  Smokeless tobacco: Never  Substance and Sexual Activity  Alcohol use: Yes  Drug use: No   Objective:   Vitals:  Weight: 78.7 kg (173 lb 6.4 oz)  Height: 175.3 cm ( )  PainSc: 0-No pain   Body mass index is 25.61 kg/m.  Physical Exam Constitutional:  General: He is not in acute distress. Appearance: Normal appearance.  HENT:  Head: Normocephalic and atraumatic.  Right Ear: External ear normal.  Left Ear: External ear normal.  Nose: Nose normal.  Mouth/Throat:  Mouth: Mucous membranes are moist.  Pharynx: Oropharynx is clear.  Eyes:  General: No scleral icterus. Extraocular Movements: Extraocular movements intact.  Conjunctiva/sclera: Conjunctivae normal.  Pupils: Pupils are equal, round, and reactive to light.  Cardiovascular:  Rate and Rhythm: Normal rate and regular rhythm.  Pulses: Normal pulses.  Heart sounds: Normal heart sounds.  Pulmonary:  Effort: Pulmonary effort is normal. No respiratory distress.  Breath sounds: Normal breath sounds.  Abdominal:  General: Abdomen is flat. Bowel sounds are normal. There is no distension.  Palpations: Abdomen is soft.  Tenderness: There is no abdominal tenderness.  Comments: There are multiple well-healed scars along the abdomen  Genitourinary: Comments: There is no obvious bulge or impulse with straining in the left groin today Musculoskeletal:  General: No swelling or deformity. Normal range of motion.  Cervical back: Normal range of  motion and neck supple. No tenderness.  Skin: General: Skin is warm and dry.  Coloration: Skin is not jaundiced.  Neurological:  General: No focal deficit present.  Mental Status: He is alert and oriented to person, place, and time.  Psychiatric:  Mood and Affect: Mood normal.  Behavior: Behavior normal.     Labs, Imaging and Diagnostic Testing:  Assessment and Plan:   Diagnoses and all orders for this visit:  Left groin pain  Left lower quadrant abdominal pain - CT abdomen pelvis without contrast; Future    The patient has been experiencing some left groin pain that feels similar to the right groin pain he was having when he had a hernia. Clinically I do not see evidence of a groin hernia today. With his complicated abdominal history I will plan to evaluate him with a CT scan of the abdomen and pelvis to look for radiographic evidence of a hernia. We will call him with the results of the study and then talk about the options if need be.   CT scan showed left inguinal and umbilical hernias.  He has elected to have both repaired.  I have discussed with him in detail the risks and benefits of the operation as well as some of the technical aspects and he understands and wishes to  proceed.

## 2023-03-08 ENCOUNTER — Encounter (HOSPITAL_BASED_OUTPATIENT_CLINIC_OR_DEPARTMENT_OTHER): Payer: Self-pay | Admitting: General Surgery

## 2023-03-08 NOTE — Anesthesia Postprocedure Evaluation (Signed)
Anesthesia Post Note  Patient: Tyler [name]  Procedure(s) Performed: LEFT INGUINAL HERNIA REPAIR WITH MESH (Left: Inguinal) UMBILICAL HERNIA REPAIR WITH MESH (Abdomen)     Patient location during evaluation: PACU Anesthesia Type: General Level of consciousness: awake and alert Pain management: pain level controlled Vital Signs Assessment: post-procedure vital signs reviewed and stable Respiratory status: spontaneous breathing, nonlabored ventilation and respiratory function stable Cardiovascular status: blood pressure returned to baseline Postop Assessment: no apparent nausea or vomiting Anesthetic complications: no   No notable events documented.  Last Vitals:  Vitals:   03/07/23 1615 03/07/23 1630  BP: 126/82   Pulse: 62   Resp: 16   Temp: (!) 36.4 C   SpO2: 95% 98%    Last Pain:  Vitals:   03/07/23 1215  TempSrc: Oral   Pain Goal: Patients Stated Pain Goal: 8 (03/07/23 1215)                 Shanda Howells

## 2023-04-20 IMAGING — CT CT IMAGE GUIDED DRAINAGE BY PERCUTANEOUS CATHETER
1 of 4 series · 13 of 32 positions shown, 18 images · non-contrast
Comparison: None Available.

INDICATION: 71-year-old male with history of diverting colostomy with persistent
and enlarging presacral fluid collection concerning for abscess,
currently with inadequate drainage via indwelling surgical drain.

EXAM:
CT IMAGE GUIDED DRAINAGE BY PERCUTANEOUS CATHETER

[Series 2: i-spiral 5.0 b40f · axial · 0.72mm/px · z∈[-209,-6]mm · 13 of 66 slices shown, 18 images]
[im 4/66  soft-tissue]
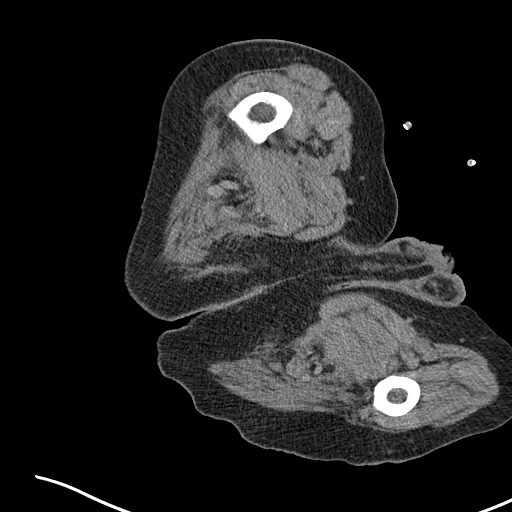
[im 4/66  bone]
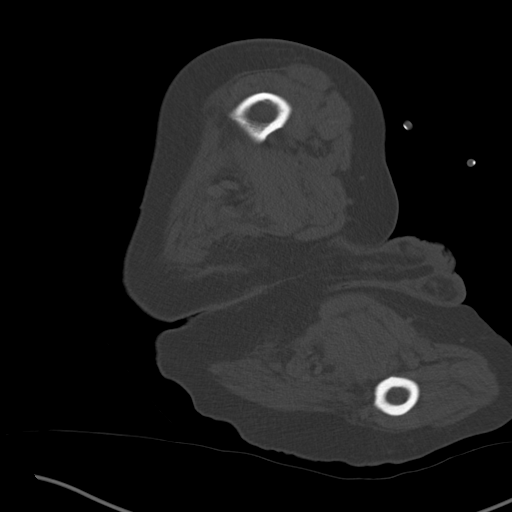
[im 12/66  soft-tissue]
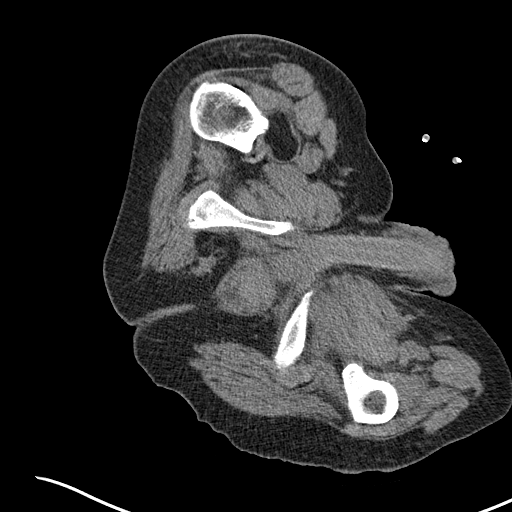
[im 16/66  soft-tissue]
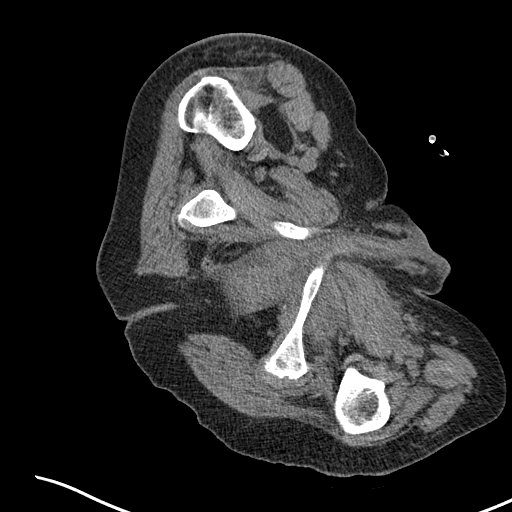
[im 20/66  soft-tissue]
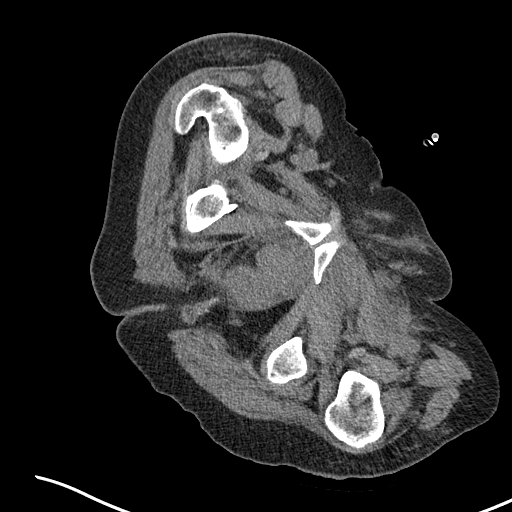
[im 27/66  soft-tissue]
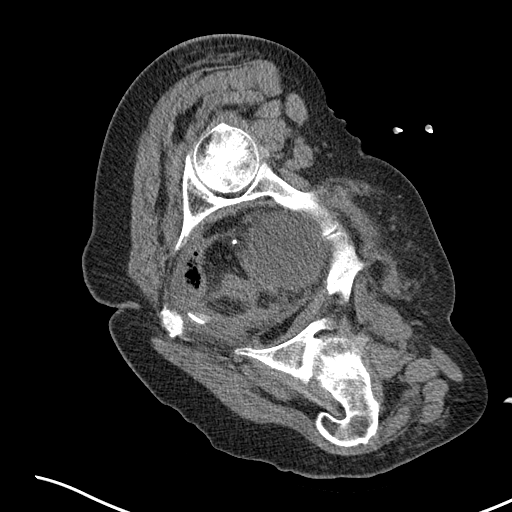
[im 31/66  soft-tissue]
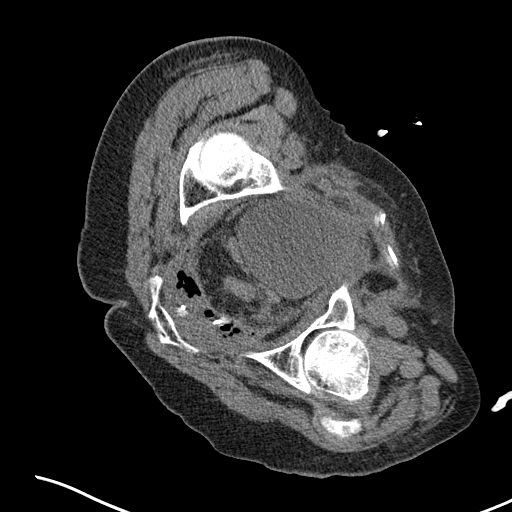
[im 35/66  soft-tissue]
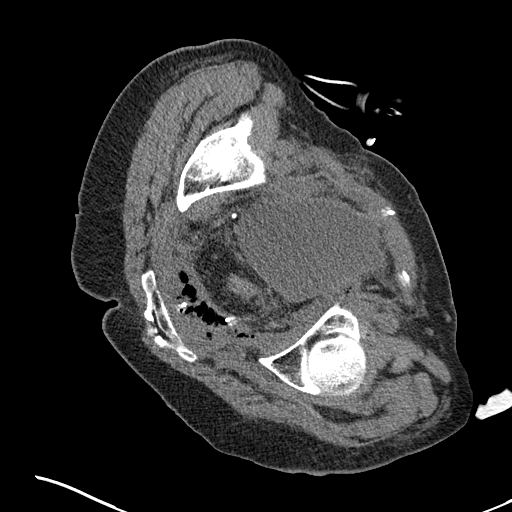
[im 43/66  soft-tissue]
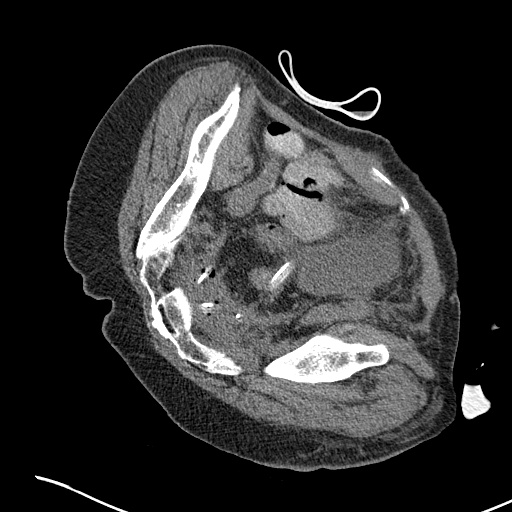
[im 46/66  soft-tissue]
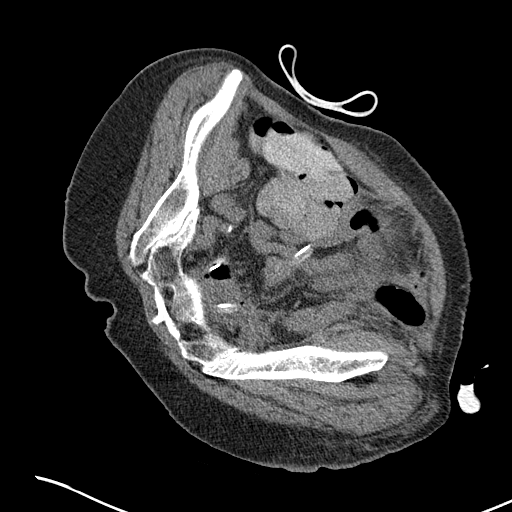
[im 46/66  bone]
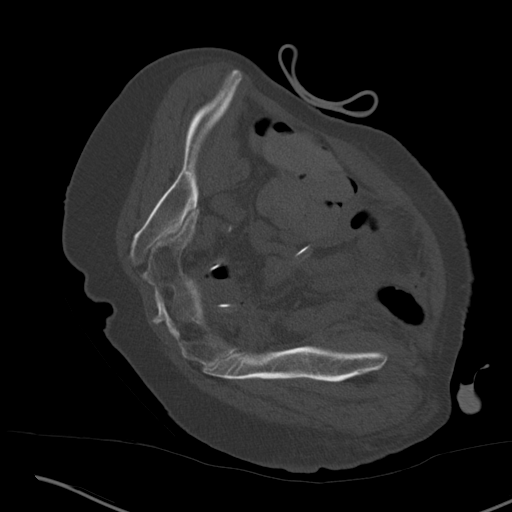
[im 50/66  soft-tissue]
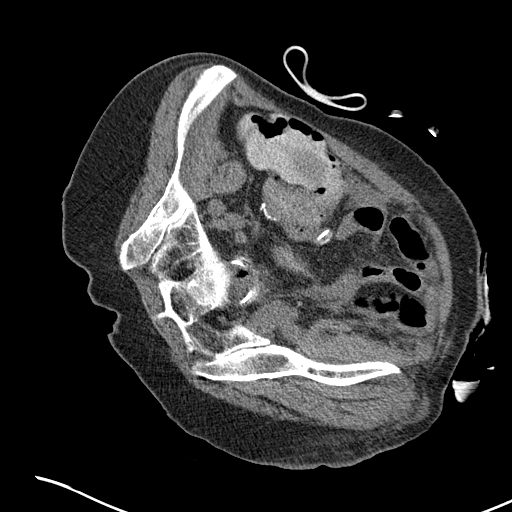
[im 50/66  lung]
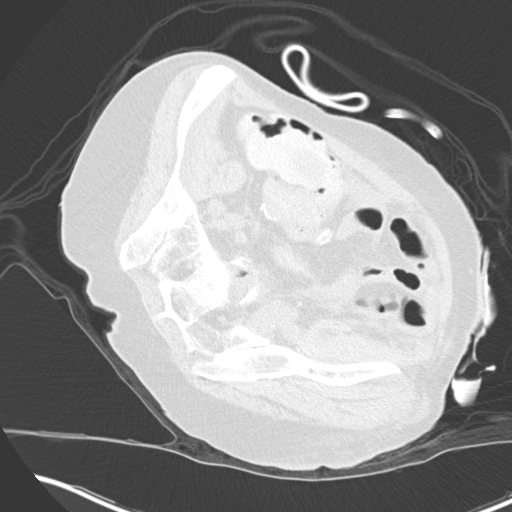
[im 54/66  lung]
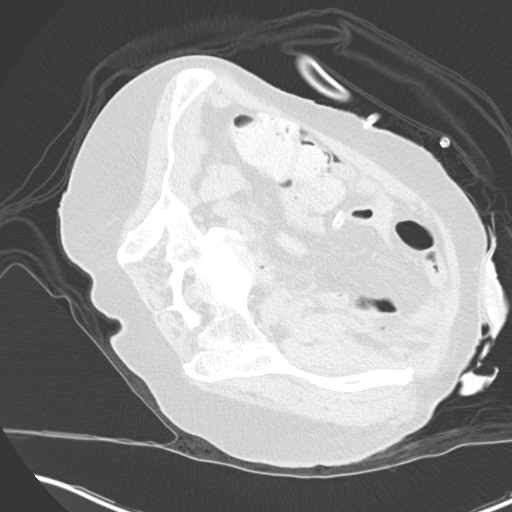
[im 58/66  soft-tissue]
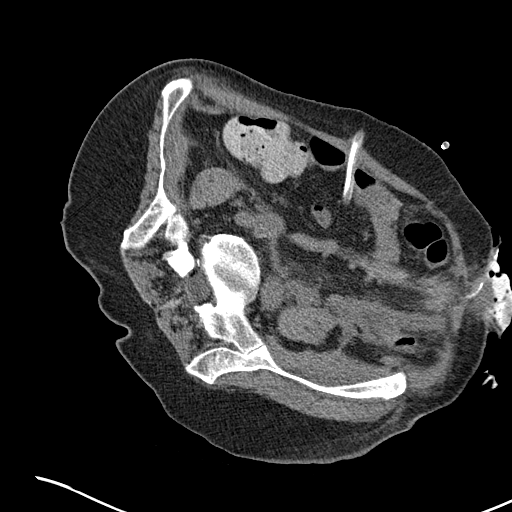
[im 58/66  lung]
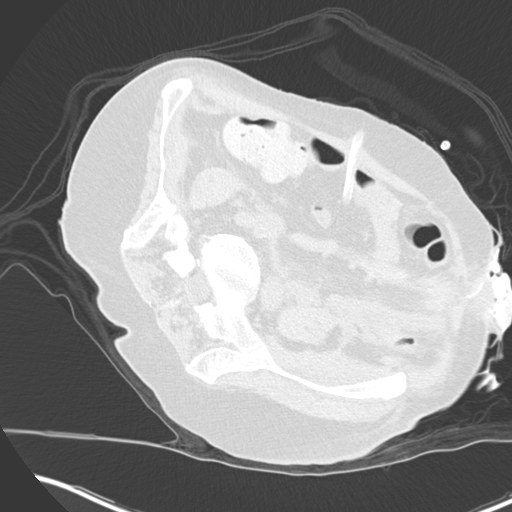
[im 62/66  soft-tissue]
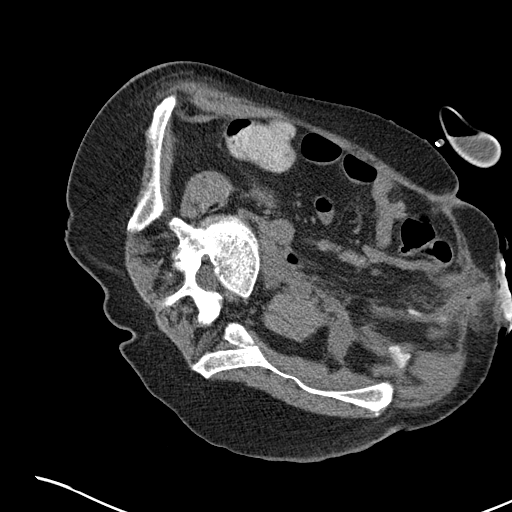
[im 62/66  lung]
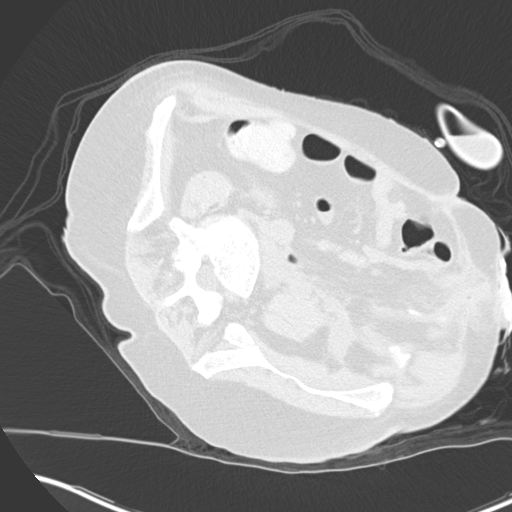

[13 of 32 positions shown; findings below may reference images not displayed]

MEDICATIONS:
The patient is currently admitted to the hospital and receiving
intravenous antibiotics. The antibiotics were administered within an
appropriate time frame prior to the initiation of the procedure.

ANESTHESIA/SEDATION:
Moderate (conscious) sedation was employed during this procedure. A
total of Versed 1.5 mg and Fentanyl 75 mcg was administered
intravenously.

Moderate Sedation Time: 15 minutes. The patient's level of
consciousness and vital signs were monitored continuously by
radiology nursing throughout the procedure under my direct
supervision.

CONTRAST:  None

COMPLICATIONS:
None immediate.

PROCEDURE:
RADIATION DOSE REDUCTION: This exam was performed according to the
departmental dose-optimization program which includes automated
exposure control, adjustment of the mA and/or kV according to
patient size and/or use of iterative reconstruction technique.

Informed written consent was obtained from the patient after a
discussion of the risks, benefits and alternatives to treatment. The
patient was placed in partial left lateral decubitus position on the
CT gantry and a pre procedural CT was performed re-demonstrating the
known abscess/fluid collection within the presacral region. The
procedure was planned. A timeout was performed prior to the
initiation of the procedure.

The right gluteal region was prepped and draped in the usual sterile
fashion. The overlying soft tissues were anesthetized with 1%
lidocaine with epinephrine. Appropriate trajectory was planned with
the use of a 22 gauge spinal needle. An 18 gauge trocar needle was
advanced into the abscess/fluid collection and a short Amplatz super
stiff wire was coiled within the collection. Appropriate positioning
was confirmed with a limited CT scan. The tract was serially dilated
allowing placement of a 12 French all-purpose drainage catheter.
Appropriate positioning was confirmed with a limited postprocedural
CT scan.

Approximately 30 ml of purulent fluid was aspirated. The tube was
connected to a bulb suction and sutured in place. A dressing was
placed. The patient tolerated the procedure well without immediate
post procedural complication.
IMPRESSION: Successful CT guided placement of a 12 French all purpose drain
catheter into the presacral abscess via right transgluteal approach
with aspiration of approximately 30 mL of purulent fluid. Samples
were sent to the laboratory as requested by the ordering clinical
team.

## 2023-05-03 DIAGNOSIS — Z85828 Personal history of other malignant neoplasm of skin: Secondary | ICD-10-CM | POA: Diagnosis not present

## 2023-05-03 DIAGNOSIS — L578 Other skin changes due to chronic exposure to nonionizing radiation: Secondary | ICD-10-CM | POA: Diagnosis not present

## 2023-05-03 DIAGNOSIS — D225 Melanocytic nevi of trunk: Secondary | ICD-10-CM | POA: Diagnosis not present

## 2023-05-03 DIAGNOSIS — D2272 Melanocytic nevi of left lower limb, including hip: Secondary | ICD-10-CM | POA: Diagnosis not present

## 2023-05-03 DIAGNOSIS — L814 Other melanin hyperpigmentation: Secondary | ICD-10-CM | POA: Diagnosis not present

## 2023-05-03 DIAGNOSIS — L570 Actinic keratosis: Secondary | ICD-10-CM | POA: Diagnosis not present

## 2023-05-03 DIAGNOSIS — D2371 Other benign neoplasm of skin of right lower limb, including hip: Secondary | ICD-10-CM | POA: Diagnosis not present

## 2023-05-03 DIAGNOSIS — S80811A Abrasion, right lower leg, initial encounter: Secondary | ICD-10-CM | POA: Diagnosis not present

## 2023-05-12 DIAGNOSIS — H1132 Conjunctival hemorrhage, left eye: Secondary | ICD-10-CM | POA: Diagnosis not present

## 2023-05-25 DIAGNOSIS — J309 Allergic rhinitis, unspecified: Secondary | ICD-10-CM | POA: Diagnosis not present

## 2023-05-25 DIAGNOSIS — J4530 Mild persistent asthma, uncomplicated: Secondary | ICD-10-CM | POA: Diagnosis not present

## 2023-05-25 DIAGNOSIS — I700 Atherosclerosis of aorta: Secondary | ICD-10-CM | POA: Diagnosis not present

## 2023-05-25 DIAGNOSIS — K219 Gastro-esophageal reflux disease without esophagitis: Secondary | ICD-10-CM | POA: Diagnosis not present

## 2023-05-25 DIAGNOSIS — Z6826 Body mass index (BMI) 26.0-26.9, adult: Secondary | ICD-10-CM | POA: Diagnosis not present

## 2023-05-25 DIAGNOSIS — Z23 Encounter for immunization: Secondary | ICD-10-CM | POA: Diagnosis not present

## 2023-05-25 DIAGNOSIS — I10 Essential (primary) hypertension: Secondary | ICD-10-CM | POA: Diagnosis not present

## 2023-05-25 DIAGNOSIS — F419 Anxiety disorder, unspecified: Secondary | ICD-10-CM | POA: Diagnosis not present

## 2023-05-25 DIAGNOSIS — G479 Sleep disorder, unspecified: Secondary | ICD-10-CM | POA: Diagnosis not present

## 2023-06-07 ENCOUNTER — Ambulatory Visit: Payer: Medicare PPO | Admitting: Podiatry

## 2023-06-07 ENCOUNTER — Ambulatory Visit (INDEPENDENT_AMBULATORY_CARE_PROVIDER_SITE_OTHER): Payer: Medicare PPO

## 2023-06-07 DIAGNOSIS — M2010 Hallux valgus (acquired), unspecified foot: Secondary | ICD-10-CM

## 2023-06-07 DIAGNOSIS — G609 Hereditary and idiopathic neuropathy, unspecified: Secondary | ICD-10-CM

## 2023-06-07 DIAGNOSIS — M2011 Hallux valgus (acquired), right foot: Secondary | ICD-10-CM

## 2023-06-07 NOTE — Progress Notes (Signed)
Subjective:  Patient ID: Tyler [name], male    DOB: [birth year],  MRN: [id number] HPI Chief Complaint  Patient presents with   Foot Pain    Patient came I  today for numbness and tingling  in the forefoot started a year ago, rate of pain 1 out of 10, X-Rays done today     72 y.o. male presents with the above complaint.   ROS: Denies fever chills nausea vomit muscle aches pains calf pain back pain chest pain shortness of breath.  He relates that his numbness is about from his mid diaphyseal region distally.  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   ED (erectile dysfunction)    GERD (gastroesophageal reflux disease)    H/O pityriasis rosea    History of basal cell carcinoma (BCC) excision    2022 left calf s/p mohs   History of pertussis 2018   History of SCC (squamous cell carcinoma) of skin    removed from head   History of thrombocytopenia 12/03/2010   Hx of adenomatous polyp of colon 04/07/2017   Hypertension    Mild asthma    followed by pcp   OA (osteoarthritis)    knees, fingers   Pneumonia    Prolapsed internal hemorrhoids, grade 3    hx  external hemorroid banding   Senile purpura (HCC)    Sepsis due to Enterococcus (HCC) 03/09/2022   followed by ID -- dr synder   Skin cancer    Head and leg   Wears glasses    Past Surgical History:  Procedure Laterality Date   COLON RESECTION N/A 09/23/2022   Procedure: loop colostomy;  Surgeon: Romie Levee, MD;  Location: [facility];   Service: General;  Laterality: N/A;   COLONOSCOPY WITH PROPOFOL  04/01/2017   by dr Leone Payor   DIRECT LARYNGOSCOPY  1986   left vocal fold bx   (non-cancerous granuloma)   FINGER SURGERY Left 2017   ligament and tendon repair left index finger   FLEXIBLE SIGMOIDOSCOPY N/A 07/08/2022   Procedure: FLEXIBLE SIGMOIDOSCOPY;  Surgeon: Romie Levee, MD;  Location: [facility];  Service: General;  Laterality: N/A;   HEMORRHOID SURGERY N/A 03/15/2022   Procedure: ANAL EXAM UNDER ANESTHESIA WITH PROCTOSCOPY  AND DEBRIDEMENT;  Surgeon: Romie Levee, MD;  Location: [facility];  Service: General;  Laterality: N/A;   INGUINAL HERNIA REPAIR Right 05/25/2018   Procedure: RIGHT INGUINAL HERNIA REPAIR WITH MESH;  Surgeon: Griselda Miner, MD;  Location: [facility];  Service: General;  Laterality: Right;   INGUINAL HERNIA REPAIR Left 03/07/2023   Procedure: LEFT INGUINAL HERNIA REPAIR WITH MESH;  Surgeon: Griselda Miner, MD;  Location: [facility];  Service: General;  Laterality: Left;   INSERTION OF MESH Right 05/25/2018   Procedure: INSERTION OF MESH;  Surgeon: Griselda Miner, MD;  Location: [facility];  Service: General;  Laterality: Right;   IR RADIOLOGIST EVAL & MGMT  05/04/2022   KNEE  ARTHROSCOPY W/ MENISCAL REPAIR Right 10/26/2005   @[facility]   by Dr  Lequita Halt   LAPAROSCOPIC APPENDECTOMY  03/22/2011   @[facility]    LAPAROSCOPY N/A 03/20/2022   Procedure: DIAGNOSTIC LAPAROSCOPY; ABDOMINAL WASH OUT AND DRAIN PLACEMENT; CREATION OF DIVERTING COLOSTOMY; ANAL EXAM UNDER ANESTHESIA;  Surgeon: Romie Levee, MD;  [facility];  Service: General;  Laterality: N/A;   MOHS SURGERY  05/2021   left lower leg   RECTAL EXAM UNDER ANESTHESIA N/A 07/08/2022   Procedure: RECTAL EXAM UNDER ANESTHESIA;  Surgeon: Romie Levee, MD;  Location[facility];  Service: General;  Laterality: N/A;   TONSILLECTOMY  1958   TRANSANAL HEMORRHOIDAL DEARTERIALIZATION N/A  03/05/2022   Procedure: TRANSANAL HEMORRHOIDAL DEARTERIALIZATION;  Surgeon: Romie Levee, MD;  Location: [facility];  Service: General;  Laterality: N/A;   UMBILICAL HERNIA REPAIR N/A 03/07/2023   Procedure: UMBILICAL HERNIA REPAIR WITH MESH;  Surgeon: Griselda Miner, MD;  Location: [facility];  Service: General;  Laterality: N/A;    Current Outpatient Medications:    acetaminophen (TYLENOL) 500 MG tablet, Take 2 tablets (1,000 mg total) by mouth every 6 (six) hours as needed for mild pain., Disp: 30 tablet, Rfl: 0   albuterol (VENTOLIN HFA) 108 (90 Base) MCG/ACT inhaler, Inhale 2 puffs into the lungs every 6 (six) hours as needed for wheezing or shortness of breath., Disp: 8.5 g, Rfl: 0   amLODipine (NORVASC) 5 MG tablet, Take 5 mg by mouth daily., Disp: , Rfl:    azelastine (ASTELIN) 0.1 % nasal spray, Place 1 spray into both nostrils 2 (two) times daily as needed for rhinitis. Use in each nostril as directed, Disp: , Rfl:    bisoprolol (ZEBETA) 10 MG tablet, Take 1 tablet (10 mg total) by mouth at bedtime., Disp: 30 tablet, Rfl: 0   esomeprazole (NEXIUM) 20 MG capsule, Take 1 capsule (20 mg total) by mouth daily before breakfast., Disp: 30 capsule, Rfl: 0   FIBER ADULT GUMMIES PO, Take 3 tablets by mouth daily., Disp: , Rfl:    ibuprofen (ADVIL) 400 MG tablet, Take 1-2 tablets (400-800 mg total) by mouth every 8 (eight) hours as needed for fever, mild pain or cramping., Disp: 30 tablet, Rfl: 0   loperamide (IMODIUM) 2 MG capsule, Take 1 capsule (2 mg total) by mouth with breakfast, with lunch, and with evening meal. (Patient not taking: Reported on 06/29/2022), Disp: 30 capsule, Rfl: 0   melatonin 3 MG TABS tablet, Take 1 tablet (3 mg total) by mouth at bedtime. (Patient taking differently: Take 3 mg by mouth at bedtime as needed.), Disp: 30 tablet, Rfl: 0   montelukast (SINGULAIR) 10 MG tablet, Take 1 tablet (10 mg total) by mouth at bedtime., Disp: 30 tablet, Rfl:  0   tadalafil (CIALIS) 5 MG tablet, Take 5 mg by mouth daily as needed for erectile dysfunction., Disp: , Rfl:    traMADol (ULTRAM) 50 MG tablet, Take 1-2 tablets (50-100 mg total) by mouth every 6 (six) hours as needed for moderate pain or severe pain., Disp: 30 tablet, Rfl: 0   traMADol (ULTRAM) 50 MG tablet, Take 1 tablet (50 mg total) by mouth every 6 (six) hours as needed., Disp: 20 tablet, Rfl: 1   valACYclovir (VALTREX) 500 MG tablet, Take 500 mg by mouth daily., Disp: , Rfl:   Allergies  Allergen Reactions   Fluconazole Hives, Rash and Other (See Comments)    Shingles activated    Griseofulvin Anaphylaxis, Swelling, Rash and  Other (See Comments)    Throat swelling     Penicillins Rash and Other (See Comments)    High fever, tolerates Cefepime TOLERATING ZOSYN [id number]   Sulfa Antibiotics Other (See Comments)    Joints ache, swell and caused inflammation    Oxycodone Nausea Only and Other (See Comments)    Nauseous to the point of almost vomiting   Mometasone Furo-Formoterol Fum Other (See Comments)    Lack of therapeutic effect    Retapamulin Rash   Review of Systems Objective:  There were no vitals filed for this visit.  General: Well developed, nourished, in no acute distress, alert and oriented x3   Dermatological: Skin is warm, dry and supple bilateral. Nails x 10 are well maintained; remaining integument appears unremarkable at this time. There are no open sores, no preulcerative lesions, no rash or signs of infection present.  Vascular: Dorsalis Pedis artery and Posterior Tibial artery pedal pulses are 2/4 bilateral with immedate capillary fill time. Pedal hair growth present. No varicosities and no lower extremity edema present bilateral.   Neruologic: Grossly intact via light touch bilateral. Vibratory intact via tuning fork bilateral. Protective threshold with Semmes Wienstein monofilament diminished to all of the forefoot sites.  Normal midfoot proximal to knee.   Patellar and Achilles deep tendon reflexes 2+ bilateral. No Babinski or clonus noted bilateral.   Musculoskeletal: No gross boney pedal deformities bilateral. No pain, crepitus, or limitation noted with foot and ankle range of motion bilateral. Muscular strength 5/5 in all groups tested bilateral.  Hallux valgus deformities bilateral good range of motion however.  No pain on palpation or range of motion of the joint or over the lesser metatarsophalangeal joints.  Gait: Unassisted, Nonantalgic.    Radiographs:  Radiographs taken today demonstrate osseously mature individual no significant acute abnormalities he does however demonstrate moderate to severe bunion deformities.  Assessment & Plan:   Assessment: Hallux valgus bilateral.  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  Plan: I recommended that he follow-up with primary care for a battery of test which she states they are doing on his next visit.  I do recommend thyroid testing B12 folate and horse blood sugar.  Also recommended over-the-counter Nurvive I will follow-up with him on an as-needed basis.     Lucile Hillmann T. [place], [place]

## 2023-06-28 DIAGNOSIS — U071 COVID-19: Secondary | ICD-10-CM | POA: Diagnosis not present

## 2023-06-28 DIAGNOSIS — Z6825 Body mass index (BMI) 25.0-25.9, adult: Secondary | ICD-10-CM | POA: Diagnosis not present

## 2023-07-19 DIAGNOSIS — Z6825 Body mass index (BMI) 25.0-25.9, adult: Secondary | ICD-10-CM | POA: Diagnosis not present

## 2023-07-19 DIAGNOSIS — J45909 Unspecified asthma, uncomplicated: Secondary | ICD-10-CM | POA: Diagnosis not present

## 2023-08-04 DIAGNOSIS — D485 Neoplasm of uncertain behavior of skin: Secondary | ICD-10-CM | POA: Diagnosis not present

## 2023-08-04 DIAGNOSIS — L570 Actinic keratosis: Secondary | ICD-10-CM | POA: Diagnosis not present

## 2023-08-04 DIAGNOSIS — D045 Carcinoma in situ of skin of trunk: Secondary | ICD-10-CM | POA: Diagnosis not present

## 2023-08-16 DIAGNOSIS — H40023 Open angle with borderline findings, high risk, bilateral: Secondary | ICD-10-CM | POA: Diagnosis not present

## 2023-08-22 ENCOUNTER — Other Ambulatory Visit: Payer: Self-pay | Admitting: General Surgery

## 2023-08-22 DIAGNOSIS — R1032 Left lower quadrant pain: Secondary | ICD-10-CM

## 2023-09-12 ENCOUNTER — Ambulatory Visit
Admission: RE | Admit: 2023-09-12 | Discharge: 2023-09-12 | Disposition: A | Discharge status: Home / Self Care | Payer: Medicare PPO | Source: Ambulatory Visit | Attending: General Surgery | Admitting: General Surgery

## 2023-09-12 DIAGNOSIS — R1032 Left lower quadrant pain: Secondary | ICD-10-CM

## 2023-09-12 DIAGNOSIS — I700 Atherosclerosis of aorta: Secondary | ICD-10-CM | POA: Diagnosis not present

## 2023-09-12 MED ORDER — IOPAMIDOL (ISOVUE-300) INJECTION 61%
500.0000 mL | Freq: Once | INTRAVENOUS | Status: AC | PRN
  Administered 2023-09-12: 100 mL via INTRAVENOUS

## 2023-09-22 DIAGNOSIS — D044 Carcinoma in situ of skin of scalp and neck: Secondary | ICD-10-CM | POA: Diagnosis not present

## 2023-11-01 DIAGNOSIS — L814 Other melanin hyperpigmentation: Secondary | ICD-10-CM | POA: Diagnosis not present

## 2023-11-01 DIAGNOSIS — D2371 Other benign neoplasm of skin of right lower limb, including hip: Secondary | ICD-10-CM | POA: Diagnosis not present

## 2023-11-01 DIAGNOSIS — L739 Follicular disorder, unspecified: Secondary | ICD-10-CM | POA: Diagnosis not present

## 2023-11-01 DIAGNOSIS — Z85828 Personal history of other malignant neoplasm of skin: Secondary | ICD-10-CM | POA: Diagnosis not present

## 2023-11-01 DIAGNOSIS — L905 Scar conditions and fibrosis of skin: Secondary | ICD-10-CM | POA: Diagnosis not present

## 2023-11-01 DIAGNOSIS — D485 Neoplasm of uncertain behavior of skin: Secondary | ICD-10-CM | POA: Diagnosis not present

## 2023-11-01 DIAGNOSIS — D225 Melanocytic nevi of trunk: Secondary | ICD-10-CM | POA: Diagnosis not present

## 2023-11-01 DIAGNOSIS — L570 Actinic keratosis: Secondary | ICD-10-CM | POA: Diagnosis not present

## 2023-11-01 DIAGNOSIS — L578 Other skin changes due to chronic exposure to nonionizing radiation: Secondary | ICD-10-CM | POA: Diagnosis not present

## 2023-11-01 DIAGNOSIS — D2272 Melanocytic nevi of left lower limb, including hip: Secondary | ICD-10-CM | POA: Diagnosis not present

## 2023-11-11 DIAGNOSIS — Z6826 Body mass index (BMI) 26.0-26.9, adult: Secondary | ICD-10-CM | POA: Diagnosis not present

## 2023-11-11 DIAGNOSIS — J00 Acute nasopharyngitis [common cold]: Secondary | ICD-10-CM | POA: Diagnosis not present

## 2023-11-24 DIAGNOSIS — Z136 Encounter for screening for cardiovascular disorders: Secondary | ICD-10-CM | POA: Diagnosis not present

## 2023-11-24 DIAGNOSIS — Z13220 Encounter for screening for lipoid disorders: Secondary | ICD-10-CM | POA: Diagnosis not present

## 2023-11-24 DIAGNOSIS — I10 Essential (primary) hypertension: Secondary | ICD-10-CM | POA: Diagnosis not present

## 2023-11-24 DIAGNOSIS — Z125 Encounter for screening for malignant neoplasm of prostate: Secondary | ICD-10-CM | POA: Diagnosis not present

## 2023-11-30 DIAGNOSIS — Z1331 Encounter for screening for depression: Secondary | ICD-10-CM | POA: Diagnosis not present

## 2023-11-30 DIAGNOSIS — J309 Allergic rhinitis, unspecified: Secondary | ICD-10-CM | POA: Diagnosis not present

## 2023-11-30 DIAGNOSIS — N529 Male erectile dysfunction, unspecified: Secondary | ICD-10-CM | POA: Diagnosis not present

## 2023-11-30 DIAGNOSIS — K219 Gastro-esophageal reflux disease without esophagitis: Secondary | ICD-10-CM | POA: Diagnosis not present

## 2023-11-30 DIAGNOSIS — Z0000 Encounter for general adult medical examination without abnormal findings: Secondary | ICD-10-CM | POA: Diagnosis not present

## 2023-11-30 DIAGNOSIS — B009 Herpesviral infection, unspecified: Secondary | ICD-10-CM | POA: Diagnosis not present

## 2023-11-30 DIAGNOSIS — J4530 Mild persistent asthma, uncomplicated: Secondary | ICD-10-CM | POA: Diagnosis not present

## 2023-11-30 DIAGNOSIS — I10 Essential (primary) hypertension: Secondary | ICD-10-CM | POA: Diagnosis not present

## 2023-11-30 DIAGNOSIS — F4310 Post-traumatic stress disorder, unspecified: Secondary | ICD-10-CM | POA: Diagnosis not present

## 2023-12-16 DIAGNOSIS — F4323 Adjustment disorder with mixed anxiety and depressed mood: Secondary | ICD-10-CM | POA: Diagnosis not present

## 2024-01-13 DIAGNOSIS — F4323 Adjustment disorder with mixed anxiety and depressed mood: Secondary | ICD-10-CM | POA: Diagnosis not present

## 2024-02-02 DIAGNOSIS — J3089 Other allergic rhinitis: Secondary | ICD-10-CM | POA: Diagnosis not present

## 2024-02-02 DIAGNOSIS — J329 Chronic sinusitis, unspecified: Secondary | ICD-10-CM | POA: Diagnosis not present

## 2024-02-02 DIAGNOSIS — B9689 Other specified bacterial agents as the cause of diseases classified elsewhere: Secondary | ICD-10-CM | POA: Diagnosis not present

## 2024-02-02 DIAGNOSIS — Z6826 Body mass index (BMI) 26.0-26.9, adult: Secondary | ICD-10-CM | POA: Diagnosis not present

## 2024-02-06 DIAGNOSIS — M25569 Pain in unspecified knee: Secondary | ICD-10-CM | POA: Diagnosis not present

## 2024-02-06 DIAGNOSIS — S199XXA Unspecified injury of neck, initial encounter: Secondary | ICD-10-CM | POA: Diagnosis not present

## 2024-02-06 DIAGNOSIS — Z87891 Personal history of nicotine dependence: Secondary | ICD-10-CM | POA: Diagnosis not present

## 2024-02-06 DIAGNOSIS — M47812 Spondylosis without myelopathy or radiculopathy, cervical region: Secondary | ICD-10-CM | POA: Diagnosis not present

## 2024-02-06 DIAGNOSIS — S0101XA Laceration without foreign body of scalp, initial encounter: Secondary | ICD-10-CM | POA: Diagnosis not present

## 2024-02-06 DIAGNOSIS — W1809XA Striking against other object with subsequent fall, initial encounter: Secondary | ICD-10-CM | POA: Diagnosis not present

## 2024-02-06 DIAGNOSIS — S80211A Abrasion, right knee, initial encounter: Secondary | ICD-10-CM | POA: Diagnosis not present

## 2024-02-06 DIAGNOSIS — I10 Essential (primary) hypertension: Secondary | ICD-10-CM | POA: Diagnosis not present

## 2024-02-06 DIAGNOSIS — S8991XA Unspecified injury of right lower leg, initial encounter: Secondary | ICD-10-CM | POA: Diagnosis not present

## 2024-02-06 DIAGNOSIS — Z23 Encounter for immunization: Secondary | ICD-10-CM | POA: Diagnosis not present

## 2024-02-06 DIAGNOSIS — W19XXXA Unspecified fall, initial encounter: Secondary | ICD-10-CM | POA: Diagnosis not present

## 2024-02-06 DIAGNOSIS — S0181XA Laceration without foreign body of other part of head, initial encounter: Secondary | ICD-10-CM | POA: Diagnosis not present

## 2024-02-06 DIAGNOSIS — R58 Hemorrhage, not elsewhere classified: Secondary | ICD-10-CM | POA: Diagnosis not present

## 2024-02-06 DIAGNOSIS — G4489 Other headache syndrome: Secondary | ICD-10-CM | POA: Diagnosis not present

## 2024-02-16 DIAGNOSIS — Z6826 Body mass index (BMI) 26.0-26.9, adult: Secondary | ICD-10-CM | POA: Diagnosis not present

## 2024-02-16 DIAGNOSIS — S0181XD Laceration without foreign body of other part of head, subsequent encounter: Secondary | ICD-10-CM | POA: Diagnosis not present

## 2024-02-16 DIAGNOSIS — S81011D Laceration without foreign body, right knee, subsequent encounter: Secondary | ICD-10-CM | POA: Diagnosis not present

## 2024-02-24 DIAGNOSIS — H40023 Open angle with borderline findings, high risk, bilateral: Secondary | ICD-10-CM | POA: Diagnosis not present

## 2024-03-19 DIAGNOSIS — M7041 Prepatellar bursitis, right knee: Secondary | ICD-10-CM | POA: Diagnosis not present

## 2024-03-19 DIAGNOSIS — M1711 Unilateral primary osteoarthritis, right knee: Secondary | ICD-10-CM | POA: Diagnosis not present

## 2024-04-18 DIAGNOSIS — L578 Other skin changes due to chronic exposure to nonionizing radiation: Secondary | ICD-10-CM | POA: Diagnosis not present

## 2024-04-18 DIAGNOSIS — D225 Melanocytic nevi of trunk: Secondary | ICD-10-CM | POA: Diagnosis not present

## 2024-04-18 DIAGNOSIS — S60869A Insect bite (nonvenomous) of unspecified wrist, initial encounter: Secondary | ICD-10-CM | POA: Diagnosis not present

## 2024-04-18 DIAGNOSIS — Z85828 Personal history of other malignant neoplasm of skin: Secondary | ICD-10-CM | POA: Diagnosis not present

## 2024-04-18 DIAGNOSIS — D2272 Melanocytic nevi of left lower limb, including hip: Secondary | ICD-10-CM | POA: Diagnosis not present

## 2024-04-18 DIAGNOSIS — L570 Actinic keratosis: Secondary | ICD-10-CM | POA: Diagnosis not present

## 2024-04-18 DIAGNOSIS — L814 Other melanin hyperpigmentation: Secondary | ICD-10-CM | POA: Diagnosis not present

## 2024-04-18 DIAGNOSIS — W57XXXA Bitten or stung by nonvenomous insect and other nonvenomous arthropods, initial encounter: Secondary | ICD-10-CM | POA: Diagnosis not present

## 2024-04-18 DIAGNOSIS — D2371 Other benign neoplasm of skin of right lower limb, including hip: Secondary | ICD-10-CM | POA: Diagnosis not present

## 2024-05-02 DIAGNOSIS — I10 Essential (primary) hypertension: Secondary | ICD-10-CM | POA: Diagnosis not present

## 2024-05-14 DIAGNOSIS — N401 Enlarged prostate with lower urinary tract symptoms: Secondary | ICD-10-CM | POA: Diagnosis not present

## 2024-05-14 DIAGNOSIS — J45909 Unspecified asthma, uncomplicated: Secondary | ICD-10-CM | POA: Diagnosis not present

## 2024-05-14 DIAGNOSIS — F4323 Adjustment disorder with mixed anxiety and depressed mood: Secondary | ICD-10-CM | POA: Diagnosis not present

## 2024-05-14 DIAGNOSIS — J4530 Mild persistent asthma, uncomplicated: Secondary | ICD-10-CM | POA: Diagnosis not present

## 2024-05-31 DIAGNOSIS — Z6826 Body mass index (BMI) 26.0-26.9, adult: Secondary | ICD-10-CM | POA: Diagnosis not present

## 2024-05-31 DIAGNOSIS — J329 Chronic sinusitis, unspecified: Secondary | ICD-10-CM | POA: Diagnosis not present

## 2024-05-31 DIAGNOSIS — I10 Essential (primary) hypertension: Secondary | ICD-10-CM | POA: Diagnosis not present

## 2024-05-31 DIAGNOSIS — J4530 Mild persistent asthma, uncomplicated: Secondary | ICD-10-CM | POA: Diagnosis not present

## 2024-05-31 DIAGNOSIS — Z131 Encounter for screening for diabetes mellitus: Secondary | ICD-10-CM | POA: Diagnosis not present

## 2024-05-31 DIAGNOSIS — G629 Polyneuropathy, unspecified: Secondary | ICD-10-CM | POA: Diagnosis not present

## 2024-05-31 DIAGNOSIS — I872 Venous insufficiency (chronic) (peripheral): Secondary | ICD-10-CM | POA: Diagnosis not present

## 2024-05-31 DIAGNOSIS — E7800 Pure hypercholesterolemia, unspecified: Secondary | ICD-10-CM | POA: Diagnosis not present

## 2024-05-31 DIAGNOSIS — R195 Other fecal abnormalities: Secondary | ICD-10-CM | POA: Diagnosis not present

## 2024-06-14 DIAGNOSIS — I10 Essential (primary) hypertension: Secondary | ICD-10-CM | POA: Diagnosis not present

## 2024-06-14 DIAGNOSIS — J45909 Unspecified asthma, uncomplicated: Secondary | ICD-10-CM | POA: Diagnosis not present

## 2024-06-14 DIAGNOSIS — J4530 Mild persistent asthma, uncomplicated: Secondary | ICD-10-CM | POA: Diagnosis not present

## 2024-06-14 DIAGNOSIS — N401 Enlarged prostate with lower urinary tract symptoms: Secondary | ICD-10-CM | POA: Diagnosis not present

## 2024-06-14 DIAGNOSIS — F4323 Adjustment disorder with mixed anxiety and depressed mood: Secondary | ICD-10-CM | POA: Diagnosis not present

## 2024-06-30 DIAGNOSIS — I10 Essential (primary) hypertension: Secondary | ICD-10-CM | POA: Diagnosis not present

## 2024-07-15 DIAGNOSIS — I10 Essential (primary) hypertension: Secondary | ICD-10-CM | POA: Diagnosis not present

## 2024-07-15 DIAGNOSIS — J4530 Mild persistent asthma, uncomplicated: Secondary | ICD-10-CM | POA: Diagnosis not present

## 2024-07-15 DIAGNOSIS — F4323 Adjustment disorder with mixed anxiety and depressed mood: Secondary | ICD-10-CM | POA: Diagnosis not present

## 2024-07-15 DIAGNOSIS — J45909 Unspecified asthma, uncomplicated: Secondary | ICD-10-CM | POA: Diagnosis not present

## 2024-07-15 DIAGNOSIS — N401 Enlarged prostate with lower urinary tract symptoms: Secondary | ICD-10-CM | POA: Diagnosis not present

## 2024-07-30 DIAGNOSIS — I10 Essential (primary) hypertension: Secondary | ICD-10-CM | POA: Diagnosis not present

## 2024-08-14 DIAGNOSIS — N401 Enlarged prostate with lower urinary tract symptoms: Secondary | ICD-10-CM | POA: Diagnosis not present

## 2024-08-14 DIAGNOSIS — I10 Essential (primary) hypertension: Secondary | ICD-10-CM | POA: Diagnosis not present

## 2024-08-14 DIAGNOSIS — J45909 Unspecified asthma, uncomplicated: Secondary | ICD-10-CM | POA: Diagnosis not present

## 2024-08-14 DIAGNOSIS — J4530 Mild persistent asthma, uncomplicated: Secondary | ICD-10-CM | POA: Diagnosis not present

## 2024-08-14 DIAGNOSIS — F4323 Adjustment disorder with mixed anxiety and depressed mood: Secondary | ICD-10-CM | POA: Diagnosis not present

## 2024-08-27 DIAGNOSIS — H40023 Open angle with borderline findings, high risk, bilateral: Secondary | ICD-10-CM | POA: Diagnosis not present

## 2024-08-29 DIAGNOSIS — I10 Essential (primary) hypertension: Secondary | ICD-10-CM | POA: Diagnosis not present

## 2024-09-04 DIAGNOSIS — R7989 Other specified abnormal findings of blood chemistry: Secondary | ICD-10-CM | POA: Diagnosis not present

## 2024-09-04 DIAGNOSIS — E538 Deficiency of other specified B group vitamins: Secondary | ICD-10-CM | POA: Diagnosis not present

## 2024-09-14 ENCOUNTER — Encounter: Payer: Self-pay | Admitting: Internal Medicine

## 2024-09-14 ENCOUNTER — Ambulatory Visit: Admitting: Internal Medicine

## 2024-09-14 ENCOUNTER — Other Ambulatory Visit

## 2024-09-14 VITALS — BP 116/70 | HR 63 | Ht 69.0 in | Wt 177.2 lb

## 2024-09-14 DIAGNOSIS — E538 Deficiency of other specified B group vitamins: Secondary | ICD-10-CM

## 2024-09-14 DIAGNOSIS — J45909 Unspecified asthma, uncomplicated: Secondary | ICD-10-CM | POA: Diagnosis not present

## 2024-09-14 DIAGNOSIS — K529 Noninfective gastroenteritis and colitis, unspecified: Secondary | ICD-10-CM

## 2024-09-14 DIAGNOSIS — N401 Enlarged prostate with lower urinary tract symptoms: Secondary | ICD-10-CM | POA: Diagnosis not present

## 2024-09-14 DIAGNOSIS — J4530 Mild persistent asthma, uncomplicated: Secondary | ICD-10-CM | POA: Diagnosis not present

## 2024-09-14 DIAGNOSIS — Z860101 Personal history of adenomatous and serrated colon polyps: Secondary | ICD-10-CM

## 2024-09-14 DIAGNOSIS — F4323 Adjustment disorder with mixed anxiety and depressed mood: Secondary | ICD-10-CM | POA: Diagnosis not present

## 2024-09-14 DIAGNOSIS — I10 Essential (primary) hypertension: Secondary | ICD-10-CM | POA: Diagnosis not present

## 2024-09-14 MED ORDER — NA SULFATE-K SULFATE-MG SULF 17.5-3.13-1.6 GM/177ML PO SOLN: 1.0000 | ORAL | 0 refills | Status: DC

## 2024-09-14 NOTE — Progress Notes (Signed)
 I gave       Tyler [name] 73 y.o. January 04, 1951 [id number]  Assessment & Plan:   Encounter Diagnoses  Name Primary?   Chronic diarrhea Yes   Hx of adenomatous polyp of colon    B12 deficiency        Chronic diarrhea post-surgery with limited response to fiber  and low dose Imodium . Differential includes malabsorption, altered gut flora, and gluten allergy.  Functional disturbance also possible.  Infectious causes unlikely. - Order colonoscopy to evaluate colon, terminal ileum.  Random biopsies likely. -Consider calprotectin after colonoscopy depending upon findings (question small bowel issue) - Check for celiac disease (TTG antibody, IgA level).  This is a possible cause of low B12 though there are many other.  His level has normalized on oral supplementation.    Colonoscopy will also serve as polyp surveillance exam.    The risks and benefits as well as alternatives of endoscopic procedure(s) have been discussed and reviewed. All questions answered. The patient agrees to proceed.   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  He was hospitalized for months overall, including a rehab stent,  had to take numerous antibiotics and had pelvic drains.  He has recovered significantly but still has problems with altered bowel habits and a chronic diarrhea.  - Chronic diarrhea present since and progressively worsening since hemorrhoid surgery in 2023 - Three bowel movements in the mornings, with initial stools flaky and later becoming clearer and liquid often like soft-serve ice cream - Occasional fecal incontinence and  decreased sphincter control since surgery - Diarrhea sometimes causes nocturnal awakening, but this is not predominant - No significant weight loss or dehydration despite ongoing diarrhea - No rectal bleeding - No specific dietary triggers identified - Not having receptive anal intercourse  Postoperative complications and surgical history - Hemorrhoid surgery in 2023 complicated by anal necrosis, sepsis, and multiple infections - Required colostomy placement and an eight and a half week hospitalization - No antibiotics in the past year; last course in April 2024 for double hernia operation  Therapeutic interventions for diarrhea - Tried fiber capsules, Benefiber (two tablespoons daily), and Imodium  - Imodium  slowed watery diarrhea but stools remained loose, has taken 1-2  Vitamin b12 deficiency and neuropathy - History of low B12 levels, not improved with B12 lozenges over the past three months - Neuropathy symptoms in feet ? 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  Folate greater than 23.  CBC in July normal hemoglobin hematocrit and white count MCV slightly elevated.  TSH was normal recently 2.71.  CMET was normal November 24, 2023.  Colonoscopy May 2018 with a diminutive adenoma and diverticulosis plus hemorrhoids Allergies  Allergen Reactions   Fluconazole  Hives, Rash and Other (See Comments)    Shingles activated    Griseofulvin  Anaphylaxis, Swelling, Rash and Other (See Comments)    Throat swelling     Penicillins Rash and Other (See Comments)    High fever, tolerates  Cefepime  TOLERATING ZOSYN  [id number]   Sulfa Antibiotics Other (See Comments)    Joints ache, swell and caused inflammation    Oxycodone  Nausea Only and Other (See Comments)    Nauseous to the point of almost  vomiting   Mometasone  Furo-Formoterol  Fum Other (See Comments)    Lack of therapeutic effect    Retapamulin Rash   Current Meds  Medication Sig   acetaminophen  (TYLENOL ) 500 MG tablet Take 2 tablets (1,000 mg total) by mouth every 6 (six) hours as needed for mild pain.   albuterol  (VENTOLIN  HFA) 108 (90 Base) MCG/ACT inhaler Inhale 2 puffs into the lungs every 6 (six) hours as needed for wheezing or shortness of breath.   amLODipine  (NORVASC ) 5 MG tablet Take 5 mg by mouth daily.   azelastine  (ASTELIN ) 0.1 % nasal spray Place 1 spray into both nostrils 2 (two) times daily as needed for rhinitis. Use in each nostril as directed   bisoprolol  (ZEBETA ) 10 MG tablet Take 1 tablet (10 mg total) by mouth at bedtime.   esomeprazole  (NEXIUM ) 20 MG capsule Take 1 capsule (20 mg total) by mouth daily before breakfast.   ibuprofen  (ADVIL ) 400 MG tablet Take 1-2 tablets (400-800 mg total) by mouth every 8 (eight) hours as needed for fever, mild pain or cramping.   melatonin 3 MG TABS tablet Take 1 tablet (3 mg total) by mouth at bedtime. (Patient taking differently: Take 3 mg by mouth at bedtime as needed.)   meloxicam  (MOBIC ) 15 MG tablet Take 15 mg by mouth daily.   montelukast  (SINGULAIR ) 10 MG tablet Take 1 tablet (10 mg total) by mouth at bedtime.   tadalafil  (CIALIS ) 5 MG tablet Take 5 mg by mouth daily as needed for erectile dysfunction.   valACYclovir  (VALTREX ) 500 MG tablet Take 500 mg by mouth daily.   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   ED (erectile dysfunction)    GERD (gastroesophageal reflux disease)    H/O pityriasis rosea    History of basal cell carcinoma (BCC) excision    2022 left calf s/p mohs   History of pertussis 2018   History of SCC (squamous cell carcinoma) of skin    removed from head   History of thrombocytopenia 12/03/2010   Hx of adenomatous polyp of colon 04/07/2017   Hypertension    Mild asthma    followed by pcp   OA (osteoarthritis)    knees, fingers   Pneumonia    Prolapsed internal hemorrhoids, grade 3    hx  external hemorroid banding   Senile purpura    Sepsis due to Enterococcus (HCC) 03/09/2022   followed by ID -- dr synder   Skin cancer    Head and leg   Wears glasses    Past Surgical History:  Procedure Laterality Date   COLON RESECTION N/A 09/23/2022   Procedure: loop colostomy;  Surgeon: Debby Hila, MD;  Location: [facility];  Service: General;  Laterality: N/A;   COLONOSCOPY WITH  PROPOFOL   04/01/2017   by dr avram   DIRECT LARYNGOSCOPY  1986   left vocal fold bx   (non-cancerous granuloma)   FINGER SURGERY Left 2017   ligament and tendon repair left index finger   FLEXIBLE SIGMOIDOSCOPY N/A 07/08/2022   Procedure: FLEXIBLE SIGMOIDOSCOPY;  Surgeon: Debby Hila, MD;  Location: [facility];  Service: General;  Laterality: N/A;   HEMORRHOID SURGERY N/A 03/15/2022   Procedure: ANAL EXAM UNDER ANESTHESIA WITH PROCTOSCOPY  AND DEBRIDEMENT;  Surgeon: Debby Hila, MD;  Location[facility];  Service: General;  Laterality: N/A;   INGUINAL HERNIA REPAIR Right 05/25/2018   Procedure: RIGHT INGUINAL HERNIA REPAIR WITH MESH;  Surgeon: Curvin Deward MOULD, MD;  Location: [facility];  Service: General;  Laterality: Right;   INGUINAL HERNIA REPAIR Left 03/07/2023   Procedure: LEFT INGUINAL HERNIA REPAIR WITH MESH;  Surgeon: Curvin Deward MOULD, MD;  Location: [facility];  Service: General;  Laterality: Left;    INSERTION OF MESH Right 05/25/2018   Procedure: INSERTION OF MESH;  Surgeon: Curvin Deward MOULD, MD;  Location: [facility];  Service: General;  Laterality: Right;   IR RADIOLOGIST EVAL & MGMT  05/04/2022   KNEE ARTHROSCOPY W/ MENISCAL REPAIR Right 10/26/2005   @[facility]   by Dr  Melodi   LAPAROSCOPIC APPENDECTOMY  03/22/2011   @[facility]    LAPAROSCOPY N/A 03/20/2022   Procedure: DIAGNOSTIC LAPAROSCOPY; ABDOMINAL WASH OUT AND DRAIN PLACEMENT; CREATION OF DIVERTING COLOSTOMY; ANAL EXAM UNDER ANESTHESIA;  Surgeon: Debby Hila, MD;  [facility];  Service: General;  Laterality: N/A;   MOHS SURGERY  05/2021   left lower leg   RECTAL EXAM UNDER ANESTHESIA N/A 07/08/2022   Procedure: RECTAL EXAM UNDER ANESTHESIA;  Surgeon: Debby Hila, MD;  Location: [facility];  Service: General;  Laterality: N/A;   TONSILLECTOMY  1958   TRANSANAL HEMORRHOIDAL DEARTERIALIZATION N/A 03/05/2022   Procedure: TRANSANAL HEMORRHOIDAL DEARTERIALIZATION;  Surgeon: Debby Hila, MD;  Location: [facility];  Service: General;  Laterality: N/A;   UMBILICAL HERNIA REPAIR N/A 03/07/2023   Procedure: UMBILICAL HERNIA REPAIR WITH MESH;  Surgeon: Curvin Deward MOULD, MD;  Location: [facility];  Service: General;  Laterality: N/A;   Social History   Social History Narrative   Patient reports he is single 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  Male partners   Former smoker   No substance abuse   Alcohol 2/day   family history includes AAA (abdominal aortic aneurysm) in his mother; Glaucoma in his sister; Stomach cancer in his paternal grandfather.   Review of Systems As per HPI  Objective:   Physical Exam @BP  116/70 (BP Location: Left Arm, Patient Position: Sitting, Cuff Size: Normal)   Pulse 63   Ht 5' 9 (1.753 m)   Wt 177 lb 4 oz (80.4 kg)   BMI 26.18 kg/m @  General:  Well-developed, well-nourished and in no acute  distress Eyes:  anicteric. Lungs: Clear to auscultation bilaterally. Heart:  S1S2, no rubs, murmurs, gallops. Abdomen:  soft, non-tender, no hepatosplenomegaly, hernia, or mass and BS+.  There are surgical scars in the lower abdomen and groin areas. Rectal: Deferred until colonoscopy Neuro:  A&O x 3.  Psych:  appropriate mood and  Affect.   Data Reviewed:  See HPI but I have reviewed hospitalizations in 2023 operative notes imaging labs

## 2024-09-14 NOTE — Patient Instructions (Signed)
 Your provider has requested that you go to the basement level for lab work before leaving today. Press B on the elevator. The lab is located at the first door on the left as you exit the elevator.  Due to recent changes in healthcare laws, you may see the results of your imaging and laboratory studies on MyChart before your provider has had a chance to review them.  We understand that in some cases there may be results that are confusing or concerning to you. Not all laboratory results come back in the same time frame and the provider may be waiting for multiple results in order to interpret others.  Please give us  48 hours in order for your provider to thoroughly review all the results before contacting the office for clarification of your results.   You have been scheduled for a colonoscopy. Please follow written instructions given to you at your visit today.   If you use inhalers (even only as needed), please bring them with you on the day of your procedure.  DO NOT TAKE 7 DAYS PRIOR TO TEST- Trulicity (dulaglutide) Ozempic, Wegovy (semaglutide) Mounjaro (tirzepatide) Bydureon Bcise (exanatide extended release)  DO NOT TAKE 1 DAY PRIOR TO YOUR TEST Rybelsus (semaglutide) Adlyxin (lixisenatide) Victoza (liraglutide) Byetta (exanatide) ___________________________________________________________________________  Tyler [name] may take up to 6 over the counter Imodium  a day.   I appreciate the opportunity to care for you. Lupita Commander, MD, [facility]

## 2024-09-15 LAB — IGA: Immunoglobulin A: 107 mg/dL (ref 70–320)

## 2024-09-15 LAB — TISSUE TRANSGLUTAMINASE, IGA: (tTG) Ab, IgA: <1 U/mL

## 2024-09-18 ENCOUNTER — Ambulatory Visit: Payer: Self-pay | Admitting: Internal Medicine

## 2024-09-24 DIAGNOSIS — E538 Deficiency of other specified B group vitamins: Secondary | ICD-10-CM | POA: Diagnosis not present

## 2024-09-27 ENCOUNTER — Telehealth: Payer: Self-pay | Admitting: *Deleted

## 2024-09-27 NOTE — Telephone Encounter (Signed)
 Attempted to call pt as he was flagged on Dr. Darilyn wait list. MD had cancellations on 10/02/24 at either 12:30pm or 1:30pm. Left message for pt to call back if he would like to reschedule for 10/02/24, otherwise he has an appt already scheduled for 10/18/24.

## 2024-09-28 DIAGNOSIS — I10 Essential (primary) hypertension: Secondary | ICD-10-CM | POA: Diagnosis not present

## 2024-09-28 NOTE — Telephone Encounter (Signed)
 Patient returned call and was scheduled for 11/18 at 1:30

## 2024-10-01 ENCOUNTER — Telehealth: Payer: Self-pay | Admitting: Internal Medicine

## 2024-10-01 NOTE — Telephone Encounter (Signed)
 Inbound call from patient in regards to having a sinus infection as well as a head cold. Patient states he has a fever of 100 that started yesterday. Please advise.   Thank you

## 2024-10-01 NOTE — Telephone Encounter (Signed)
 Returned the patient's phone call. Moved his appt to 10/18/24 due to him having a sinus infection and fever.

## 2024-10-02 ENCOUNTER — Encounter: Admitting: Internal Medicine

## 2024-10-03 DIAGNOSIS — E538 Deficiency of other specified B group vitamins: Secondary | ICD-10-CM | POA: Diagnosis not present

## 2024-10-04 DIAGNOSIS — J4530 Mild persistent asthma, uncomplicated: Secondary | ICD-10-CM | POA: Diagnosis not present

## 2024-10-04 DIAGNOSIS — R051 Acute cough: Secondary | ICD-10-CM | POA: Diagnosis not present

## 2024-10-04 DIAGNOSIS — R062 Wheezing: Secondary | ICD-10-CM | POA: Diagnosis not present

## 2024-10-04 DIAGNOSIS — J40 Bronchitis, not specified as acute or chronic: Secondary | ICD-10-CM | POA: Diagnosis not present

## 2024-10-08 DIAGNOSIS — E538 Deficiency of other specified B group vitamins: Secondary | ICD-10-CM | POA: Diagnosis not present

## 2024-10-09 ENCOUNTER — Encounter: Payer: Self-pay | Admitting: Internal Medicine

## 2024-10-14 DIAGNOSIS — F4323 Adjustment disorder with mixed anxiety and depressed mood: Secondary | ICD-10-CM | POA: Diagnosis not present

## 2024-10-14 DIAGNOSIS — J4530 Mild persistent asthma, uncomplicated: Secondary | ICD-10-CM | POA: Diagnosis not present

## 2024-10-14 DIAGNOSIS — N401 Enlarged prostate with lower urinary tract symptoms: Secondary | ICD-10-CM | POA: Diagnosis not present

## 2024-10-14 DIAGNOSIS — J45909 Unspecified asthma, uncomplicated: Secondary | ICD-10-CM | POA: Diagnosis not present

## 2024-10-14 DIAGNOSIS — I10 Essential (primary) hypertension: Secondary | ICD-10-CM | POA: Diagnosis not present

## 2024-10-15 DIAGNOSIS — E538 Deficiency of other specified B group vitamins: Secondary | ICD-10-CM | POA: Diagnosis not present

## 2024-10-17 ENCOUNTER — Other Ambulatory Visit: Payer: Self-pay | Admitting: Medical Genetics

## 2024-10-18 ENCOUNTER — Encounter: Payer: Self-pay | Admitting: Internal Medicine

## 2024-10-18 ENCOUNTER — Ambulatory Visit: Admitting: Internal Medicine

## 2024-10-18 ENCOUNTER — Encounter: Admitting: Internal Medicine

## 2024-10-18 VITALS — BP 122/80 | HR 58 | Temp 98.1°F | Resp 15 | Ht 69.0 in | Wt 177.0 lb

## 2024-10-18 DIAGNOSIS — Z860101 Personal history of adenomatous and serrated colon polyps: Secondary | ICD-10-CM | POA: Diagnosis not present

## 2024-10-18 DIAGNOSIS — K52832 Lymphocytic colitis: Secondary | ICD-10-CM | POA: Diagnosis not present

## 2024-10-18 DIAGNOSIS — K5730 Diverticulosis of large intestine without perforation or abscess without bleeding: Secondary | ICD-10-CM | POA: Diagnosis not present

## 2024-10-18 DIAGNOSIS — K529 Noninfective gastroenteritis and colitis, unspecified: Secondary | ICD-10-CM

## 2024-10-18 MED ORDER — SODIUM CHLORIDE 0.9 % IV SOLN: 500.0000 mL | Freq: Once | INTRAVENOUS | Status: DC

## 2024-10-18 NOTE — Progress Notes (Signed)
 Report to PACU, RN, vss, BBS= Clear.

## 2024-10-18 NOTE — Progress Notes (Signed)
 [facility] History and Physical   Primary Care Physician:  Aisha Harvey, MD   Reason for Procedure:    Encounter Diagnosis  Name Primary?   Chronic diarrhea Yes     Plan:    Colonoscopy   The patient was provided an opportunity to ask questions and all were answered. The patient agreed with the plan.   HPI: Tyler [name] is a 73 y.o. male presenting for evaluation of chronic diarrhea.  Celiac serology testing was negative.  He also has a history of an adenomatous colon polyp. - Chronic diarrhea present since and progressively worsening since hemorrhoid surgery in 2023 - Three bowel movements in the mornings, with initial stools flaky and later becoming clearer and liquid often like soft-serve ice cream - Occasional fecal incontinence and decreased sphincter control since surgery - Diarrhea sometimes causes nocturnal awakening, but this is not predominant - No significant weight loss or dehydration despite ongoing diarrhea - No rectal bleeding - No specific dietary triggers identified - Not having receptive anal intercourse   Postoperative complications and surgical history - Hemorrhoid surgery in 2023 complicated by anal necrosis, sepsis, and multiple infections - Required colostomy placement and an eight and a half week hospitalization - No antibiotics in the past year; last course in April 2024 for double hernia operation   Therapeutic interventions for diarrhea - Tried fiber capsules, Benefiber (two tablespoons daily), and Imodium  - Imodium  slowed watery diarrhea but stools remained loose, has taken 1-2    Past Medical History:  Diagnosis Date   Abscess, intra-abdominal, postoperative (HCC) 03/09/2022   post op hemorrhoid surgery on 03-05-2022 complicated by anal necrosis / sepsis with long hospitalization for sepsis then inpatient rehab ,  discharged 06/ 2023   Allergic rhinitis    Anemia    Anxiety associated with depression    resolved    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), bilateral    Chronic pruritic rash in adult 2004   hx shingles  w/ residual recurrent rash, take valtrex  daily   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   ED (erectile dysfunction)    GERD (gastroesophageal reflux disease)    H/O pityriasis rosea    History of basal cell carcinoma (BCC) excision    2022 left calf s/p mohs   History of pertussis 2018   History of SCC (squamous cell carcinoma) of skin    removed from head   History of thrombocytopenia 12/03/2010   Hx of adenomatous polyp of colon 04/07/2017   Hypertension    Mild asthma    followed by pcp   OA (osteoarthritis)    knees, fingers   Pneumonia    Prolapsed internal hemorrhoids, grade 3    hx  external hemorroid banding   Senile purpura    Sepsis due to Enterococcus (HCC) 03/09/2022   followed by ID -- dr synder   Skin cancer    Head and leg   Wears glasses     Past Surgical History:  Procedure Laterality Date   COLON RESECTION N/A 09/23/2022   Procedure: loop colostomy;  Surgeon: Debby Hila, MD;  Location: [facility];  Service: General;  Laterality: N/A;   COLONOSCOPY WITH PROPOFOL   04/01/2017   by dr avram   DIRECT LARYNGOSCOPY  1986   left vocal fold bx   (non-cancerous granuloma)   FINGER SURGERY Left 2017   ligament and tendon repair left index finger   FLEXIBLE SIGMOIDOSCOPY N/A 07/08/2022   Procedure: FLEXIBLE SIGMOIDOSCOPY;  Surgeon: Debby Hila, MD;  Location:  [facility];  Service: General;  Laterality: N/A;   HEMORRHOID SURGERY N/A 03/15/2022   Procedure: ANAL EXAM UNDER ANESTHESIA WITH PROCTOSCOPY  AND DEBRIDEMENT;  Surgeon: Debby Hila, MD;  Location: [facility];  Service: General;  Laterality: N/A;   INGUINAL HERNIA REPAIR Right 05/25/2018   Procedure: RIGHT INGUINAL HERNIA REPAIR WITH MESH;  Surgeon: Curvin Deward MOULD, MD;  Location: [facility];  Service: General;  Laterality: Right;   INGUINAL HERNIA REPAIR Left 03/07/2023    Procedure: LEFT INGUINAL HERNIA REPAIR WITH MESH;  Surgeon: Curvin Deward MOULD, MD;  Location: [facility];  Service: General;  Laterality: Left;   INSERTION OF MESH Right 05/25/2018   Procedure: INSERTION OF MESH;  Surgeon: Curvin Deward MOULD, MD;  Location: [facility];  Service: General;  Laterality: Right;   IR RADIOLOGIST EVAL & MGMT  05/04/2022   KNEE ARTHROSCOPY W/ MENISCAL REPAIR Right 10/26/2005   @[facility]   by Dr  Melodi   LAPAROSCOPIC APPENDECTOMY  03/22/2011   @[facility]    LAPAROSCOPY N/A 03/20/2022   Procedure: DIAGNOSTIC LAPAROSCOPY; ABDOMINAL WASH OUT AND DRAIN PLACEMENT; CREATION OF DIVERTING COLOSTOMY; ANAL EXAM UNDER ANESTHESIA;  Surgeon: Debby Hila, MD;  Location: [facility];  Service: General;  Laterality: N/A;   MOHS SURGERY  05/2021   left lower leg   RECTAL EXAM UNDER ANESTHESIA N/A 07/08/2022   Procedure: RECTAL EXAM UNDER ANESTHESIA;  Surgeon: Debby Hila, MD;  Location: [facility];  Service: General;  Laterality: N/A;   TONSILLECTOMY  1958   TRANSANAL HEMORRHOIDAL DEARTERIALIZATION N/A 03/05/2022   Procedure: TRANSANAL HEMORRHOIDAL DEARTERIALIZATION;  Surgeon: Debby Hila, MD;  Location: [facility];  Service: General;  Laterality: N/A;   UMBILICAL HERNIA REPAIR N/A 03/07/2023   Procedure: UMBILICAL HERNIA REPAIR WITH MESH;  Surgeon: Curvin Deward MOULD, MD;  Location: [facility];  Service: General;  Laterality: N/A;     Current Outpatient Medications  Medication Sig Dispense Refill   amLODipine  (NORVASC ) 5 MG tablet Take 5 mg by mouth daily.     bisoprolol  (ZEBETA ) 10 MG tablet Take 1 tablet (10 mg total) by mouth at bedtime. 30 tablet 0   esomeprazole  (NEXIUM ) 20 MG capsule Take 1 capsule (20 mg total) by mouth daily before breakfast. 30 capsule 0   montelukast  (SINGULAIR ) 10 MG tablet Take 1 tablet (10 mg total) by mouth at bedtime. 30 tablet 0   tadalafil  (CIALIS ) 5 MG tablet Take 5 mg by mouth daily as  needed for erectile dysfunction.     valACYclovir  (VALTREX ) 500 MG tablet Take 500 mg by mouth daily.     acetaminophen  (TYLENOL ) 500 MG tablet Take 2 tablets (1,000 mg total) by mouth every 6 (six) hours as needed for mild pain. 30 tablet 0   albuterol  (VENTOLIN  HFA) 108 (90 Base) MCG/ACT inhaler Inhale 2 puffs into the lungs every 6 (six) hours as needed for wheezing or shortness of breath. 8.5 g 0   azelastine  (ASTELIN ) 0.1 % nasal spray Place 1 spray into both nostrils 2 (two) times daily as needed for rhinitis. Use in each nostril as directed     ibuprofen  (ADVIL ) 400 MG tablet Take 1-2 tablets (400-800 mg total) by mouth every 8 (eight) hours as needed for fever, mild pain or cramping. 30 tablet 0   loperamide  (IMODIUM ) 2 MG capsule Take 1 capsule (2 mg total) by mouth with breakfast, with lunch, and with evening meal. (Patient not taking: Reported on 06/29/2022) 30  capsule 0   Current Facility-Administered Medications  Medication Dose Route Frequency Provider Last Rate Last Admin   0.9 %  sodium chloride  infusion  500 mL Intravenous Once Avram Lupita BRAVO, MD        Allergies as of 10/18/2024 - Review Complete 10/18/2024  Allergen Reaction Noted   Fluconazole  Hives, Other (See Comments), Rash, and Dermatitis 03/26/2013   Griseofulvin  Anaphylaxis, Swelling, Rash, and Other (See Comments) 11/12/2013   Penicillins Rash and Other (See Comments) 09/23/2016   Sulfa antibiotics Other (See Comments) 12/12/2018   Oxycodone  Nausea Only and Other (See Comments) 03/09/2022   Doxycycline  Itching and Other (See Comments) 03/15/2023   Mometasone  furo-formoterol  fum Other (See Comments) 09/15/2015   Retapamulin Rash 05/10/2011    Family History  Problem Relation Age of Onset   AAA (abdominal aortic aneurysm) Mother    Glaucoma Sister    Stomach cancer Paternal Grandfather    Colon cancer Neg Hx    Esophageal cancer Neg Hx    Rectal cancer Neg Hx     Social History   Socioeconomic History    Marital status: Single    Spouse name: Not on file   Number of children: Not on file   Years of education: Not on file   Highest education level: Not on file  Occupational History   Occupation: Professor    Employer: [employer]  Tobacco Use   Smoking status: Former    Current packs/day: 0.00    Types: Cigarettes    Start date: 1970    Quit date: 1990    Years since quitting: 35.9   Smokeless tobacco: Never  Vaping Use   Vaping status: Never Used  Substance and Sexual Activity   Alcohol use: Yes    Alcohol/week: 14.0 standard drinks of alcohol    Types: 14 Standard drinks or equivalent per week    Comment: 2 ounces daily   Drug use: Never   Sexual activity: Yes    Partners: Male  Other Topics Concern   Not on file  Social History Narrative   Patient reports he is single 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  Male partners   Former smoker   No substance abuse   Alcohol 2/day   Social Drivers of Corporate Investment Banker Strain: Not on file  Food Insecurity: No Food Insecurity (09/23/2022)   Hunger Vital Sign    Worried About Running Out of Food in the Last Year: Never true    Ran Out of Food in the Last Year: Never true  Transportation Needs: No Transportation Needs (09/23/2022)   PRAPARE - Administrator, Civil Service (Medical): No    Lack of Transportation (Non-Medical): No  Physical Activity: Not on file  Stress: Not on file  Social Connections: Not on file  Intimate Partner Violence: Not At Risk (09/23/2022)   Humiliation, Afraid, Rape, and Kick questionnaire    Fear of Current or Ex-Partner: No    Emotionally Abused: No    Physically Abused: No    Sexually Abused: No    Review of Systems:  All other review of systems negative except as mentioned in the HPI.  Physical Exam: Vital signs BP 135/84   Pulse (!) 57   Temp 98.1 F (36.7 C)   Resp 13   Ht 5' 9 (1.753 m)   Wt 177 lb (80.3 kg)   SpO2  100%   BMI 26.14 kg/m  General:   Alert,  Well-developed, well-nourished, pleasant and cooperative in NAD Lungs:  Clear throughout to auscultation.   Heart:  Regular rate and rhythm; no murmurs, clicks, rubs,  or gallops. Abdomen:  Soft, nontender and nondistended. Normal bowel sounds.   Neuro/Psych:  Alert and cooperative. Normal mood and affect. A and O x 3   @Taaj Hurlbut  CHARLENA Commander, MD, [facility] [phone] (pager) 10/18/2024 2:24 PM@

## 2024-10-18 NOTE — Patient Instructions (Addendum)
 No polyps or cancer were seen.  No signs of inflammation though I took biopsies of the colon to look for microscopic inflammation (colitis).  You do have some diverticulosis.  This is common.  I saw some scars in the rectum from hemorrhoid surgery but otherwise okay.  You should not need any routine repeat colonoscopy.  I appreciate the opportunity to care for you. Lupita CHARLENA Commander, MD, FACG   YOU HAD AN ENDOSCOPIC PROCEDURE TODAY AT THE [facility]:   Refer to the procedure report that was given to you for any specific questions about what was found during the examination.  If the procedure report does not answer your questions, please call your gastroenterologist to clarify.  If you requested that your care partner not be given the details of your procedure findings, then the procedure report has been included in a sealed envelope for you to review at your convenience later.  YOU SHOULD EXPECT: Some feelings of bloating in the abdomen. Passage of more gas than usual.  Walking can help get rid of the air that was put into your GI tract during the procedure and reduce the bloating. If you had a lower endoscopy (such as a colonoscopy or flexible sigmoidoscopy) you may notice spotting of blood in your stool or on the toilet paper. If you underwent a bowel prep for your procedure, you may not have a normal bowel movement for a few days.  Please Note:  You might notice some irritation and congestion in your nose or some drainage.  This is from the oxygen used during your procedure.  There is no need for concern and it should clear up in a day or so.  SYMPTOMS TO REPORT IMMEDIATELY:  Following lower endoscopy (colonoscopy or flexible sigmoidoscopy):  Excessive amounts of blood in the stool  Significant tenderness or worsening of abdominal pains  Swelling of the abdomen that is new, acute  Fever of 100F or higher  For urgent or emergent issues, a gastroenterologist can be reached at  any hour by calling [phone]. Do not use MyChart messaging for urgent concerns.    DIET:  We do recommend a small meal at first, but then you may proceed to your regular diet.  Drink plenty of fluids but you should avoid alcoholic beverages for 24 hours.  ACTIVITY:  You should plan to take it easy for the rest of today and you should NOT DRIVE or use heavy machinery until tomorrow (because of the sedation medicines used during the test).    FOLLOW UP: Our staff will call the number listed on your records the next business day following your procedure.  We will call around 7:15- 8:00 am to check on you and address any questions or concerns that you may have regarding the information given to you following your procedure. If we do not reach you, we will leave a message.     If any biopsies were taken you will be contacted by phone or by letter within the next 1-3 weeks.  Please call us  at [phone] if you have not heard about the biopsies in 3 weeks.    SIGNATURES/CONFIDENTIALITY: You and/or your care partner have signed paperwork which will be entered into your electronic medical record.  These signatures attest to the fact that that the information above on your After Visit Summary has been reviewed and is understood.  Full responsibility of the confidentiality of this discharge information lies with you and/or your care-partner.

## 2024-10-18 NOTE — Op Note (Signed)
 [facility] Patient Name: Tyler [name] Procedure Date: 10/18/2024 2:01 PM MRN: [id number] Endoscopist: Lupita FORBES Commander , MD, [id number] Age: 73 Referring MD:  Date of Birth: [birth year] Gender: Male Account #: [id number] Procedure:                Colonoscopy Indications:              Chronic diarrhea Medicines:                Monitored Anesthesia Care Procedure:                Pre-Anesthesia Assessment:                           - Prior to the procedure, a History and Physical                            was performed, and patient medications and                            allergies were reviewed. The patient's tolerance of                            previous anesthesia was also reviewed. The risks                            and benefits of the procedure and the sedation                            options and risks were discussed with the patient.                            All questions were answered, and informed consent                            was obtained. Prior Anticoagulants: The patient has                            taken no anticoagulant or antiplatelet agents. ASA                            Grade Assessment: II - A patient with mild systemic                            disease. After reviewing the risks and benefits,                            the patient was deemed in satisfactory condition to                            undergo the procedure.                           After obtaining informed consent, the colonoscope  was passed under direct vision. Throughout the                            procedure, the patient's blood pressure, pulse, and                            oxygen saturations were monitored continuously. The                            Olympus Scope SN [id number] was introduced through the                            anus and advanced to the the terminal ileum, with                            identification of the appendiceal  orifice and IC                            valve. The colonoscopy was performed without                            difficulty. The patient tolerated the procedure                            well. The quality of the bowel preparation was                            good. The terminal ileum, ileocecal valve,                            appendiceal orifice, and rectum were photographed.                            The bowel preparation used was SUPREP via split                            dose instruction. Scope In: 2:26:56 PM Scope Out: 2:37:15 PM Scope Withdrawal Time: 0 hours 7 minutes 54 seconds  Total Procedure Duration: 0 hours 10 minutes 19 seconds  Findings:                 The perianal and digital rectal examinations were                            normal. Pertinent negatives include normal prostate                            (size, shape, and consistency).                           Multiple diverticula were found in the sigmoid                            colon.  There was evidence of a prior end-to-side                            colo-colonic anastomosis in the distal sigmoid                            colon. This was patent and was characterized by                            healthy appearing mucosa. The anastomosis was                            traversed.                           A scar was found in the rectum.                           The terminal ileum appeared normal.                           The exam was otherwise without abnormality on                            direct and retroflexion views.                           Biopsies for histology were taken with a cold                            forceps from the ascending colon, transverse colon,                            descending colon, sigmoid colon and rectum for                            evaluation of microscopic colitis. Estimated blood                            loss was minimal. Complications:             No immediate complications. Estimated Blood Loss:     Estimated blood loss was minimal. Impression:               - Diverticulosis in the sigmoid colon.                           - Patent end-to-side colo-colonic anastomosis,                            characterized by healthy appearing mucosa.                           - Scar in the rectum.                           - The  examined portion of the ileum was normal.                           - The examination was otherwise normal on direct                            and retroflexion views.                           - Biopsies were taken with a cold forceps from the                            ascending colon, transverse colon, descending                            colon, sigmoid colon and rectum for evaluation of                            microscopic colitis.                           - Personal history of colonic polyp 4 mm adenoma                            removed 2018. Recommendation:           - Patient has a contact number available for                            emergencies. The signs and symptoms of potential                            delayed complications were discussed with the                            patient. Return to normal activities tomorrow.                            Written discharge instructions were provided to the                            patient.                           - Resume previous diet.                           - Continue present medications.                           - Await pathology results.                           - No repeat colonoscopy due to current age (56  years or older) and the absence of colonic polyps. Lupita FORBES Commander, MD 10/18/2024 2:45:52 PM This report has been signed electronically.

## 2024-10-19 ENCOUNTER — Telehealth: Payer: Self-pay | Admitting: *Deleted

## 2024-10-19 NOTE — Telephone Encounter (Signed)
  Follow up Call-     10/18/2024    1:30 PM  Call back number  Post procedure Call Back phone  # [phone]  Permission to leave phone message Yes     Patient questions:  Do you have a fever, pain , or abdominal swelling? No. Pain Score  0 *  Have you tolerated food without any problems? Yes.    Have you been able to return to your normal activities? Yes.    Do you have any questions about your discharge instructions: Diet   No. Medications  No. Follow up visit  No.  Do you have questions or concerns about your Care? No.  Actions: * If pain score is 4 or above: No action needed, pain <4.

## 2024-10-22 DIAGNOSIS — E538 Deficiency of other specified B group vitamins: Secondary | ICD-10-CM | POA: Diagnosis not present

## 2024-10-23 LAB — SURGICAL PATHOLOGY

## 2024-10-29 ENCOUNTER — Telehealth: Payer: Self-pay | Admitting: Internal Medicine

## 2024-10-29 NOTE — Telephone Encounter (Signed)
 Dr Avram has not reviewed the path- pt aware and will be contacted as soon as able.   He has been advised via My Chart as requested

## 2024-10-29 NOTE — Telephone Encounter (Signed)
 Inbound call from patient stating that he is unsure what his pathology results say and would like to speak to the nurse in regards to this. Patient would like either a call back or a my chart message. Please advise.

## 2024-10-30 ENCOUNTER — Ambulatory Visit: Payer: Self-pay | Admitting: Internal Medicine

## 2024-10-30 MED ORDER — BUDESONIDE 3 MG PO CPEP: 9.0000 mg | ORAL_CAPSULE | ORAL | 1 refills | Status: AC

## 2024-11-19 ENCOUNTER — Other Ambulatory Visit

## 2024-11-19 DIAGNOSIS — Z006 Encounter for examination for normal comparison and control in clinical research program: Secondary | ICD-10-CM

## 2024-11-28 LAB — GENECONNECT MOLECULAR SCREEN: Genetic Analysis Overall Interpretation: NEGATIVE

## 2025-02-06 ENCOUNTER — Ambulatory Visit: Admitting: Internal Medicine
